# Patient Record
Sex: Female | Born: 1945 | Race: White | Hispanic: No | Smoking: Former smoker
Health system: Southern US, Community
[De-identification: ages and names within clinical notes are randomized; demographics above are authoritative.]

## PROBLEM LIST (undated history)

## (undated) DIAGNOSIS — K219 Gastro-esophageal reflux disease without esophagitis: Secondary | ICD-10-CM

## (undated) DIAGNOSIS — R87629 Unspecified abnormal cytological findings in specimens from vagina: Secondary | ICD-10-CM

## (undated) DIAGNOSIS — J45909 Unspecified asthma, uncomplicated: Secondary | ICD-10-CM

## (undated) DIAGNOSIS — Z86018 Personal history of other benign neoplasm: Secondary | ICD-10-CM

## (undated) DIAGNOSIS — J302 Other seasonal allergic rhinitis: Secondary | ICD-10-CM

## (undated) DIAGNOSIS — C801 Malignant (primary) neoplasm, unspecified: Secondary | ICD-10-CM

## (undated) DIAGNOSIS — Z9289 Personal history of other medical treatment: Secondary | ICD-10-CM

## (undated) DIAGNOSIS — K659 Peritonitis, unspecified: Secondary | ICD-10-CM

## (undated) DIAGNOSIS — Z87898 Personal history of other specified conditions: Secondary | ICD-10-CM

## (undated) DIAGNOSIS — I10 Essential (primary) hypertension: Secondary | ICD-10-CM

## (undated) DIAGNOSIS — J9690 Respiratory failure, unspecified, unspecified whether with hypoxia or hypercapnia: Secondary | ICD-10-CM

## (undated) DIAGNOSIS — K432 Incisional hernia without obstruction or gangrene: Secondary | ICD-10-CM

## (undated) DIAGNOSIS — N189 Chronic kidney disease, unspecified: Secondary | ICD-10-CM

## (undated) DIAGNOSIS — R32 Unspecified urinary incontinence: Secondary | ICD-10-CM

## (undated) DIAGNOSIS — K3532 Acute appendicitis with perforation and localized peritonitis, without abscess: Secondary | ICD-10-CM

## (undated) DIAGNOSIS — B029 Zoster without complications: Secondary | ICD-10-CM

## (undated) DIAGNOSIS — D649 Anemia, unspecified: Secondary | ICD-10-CM

## (undated) DIAGNOSIS — M109 Gout, unspecified: Secondary | ICD-10-CM

## (undated) HISTORY — PX: APPENDECTOMY: SHX54

## (undated) HISTORY — PX: LEEP: SHX91

## (undated) HISTORY — DX: Incisional hernia without obstruction or gangrene: K43.2

## (undated) HISTORY — PX: CATARACT EXTRACTION: SUR2

## (undated) HISTORY — PX: BREAST BIOPSY: SHX20

## (undated) HISTORY — DX: Zoster without complications: B02.9

## (undated) HISTORY — DX: Unspecified abnormal cytological findings in specimens from vagina: R87.629

## (undated) HISTORY — DX: Personal history of other specified conditions: Z87.898

## (undated) HISTORY — PX: ABDOMINAL HYSTERECTOMY: SHX81

## (undated) HISTORY — DX: Gout, unspecified: M10.9

## (undated) HISTORY — PX: REFRACTIVE SURGERY: SHX103

## (undated) HISTORY — PX: COLONOSCOPY: SHX174

## (undated) HISTORY — DX: Essential (primary) hypertension: I10

---

## 1998-04-10 HISTORY — PX: LEEP: SHX91

## 1998-07-23 ENCOUNTER — Other Ambulatory Visit: Admission: RE | Admit: 1998-07-23 | Discharge: 1998-07-23 | Payer: Self-pay | Admitting: Obstetrics and Gynecology

## 1998-08-03 ENCOUNTER — Encounter: Payer: Self-pay | Admitting: Obstetrics and Gynecology

## 1998-08-03 ENCOUNTER — Inpatient Hospital Stay (HOSPITAL_COMMUNITY): Admission: RE | Admit: 1998-08-03 | Discharge: 1998-08-16 | Payer: Self-pay | Admitting: Obstetrics and Gynecology

## 1998-08-04 ENCOUNTER — Encounter: Payer: Self-pay | Admitting: Pulmonary Disease

## 1998-08-05 ENCOUNTER — Encounter: Payer: Self-pay | Admitting: Obstetrics and Gynecology

## 1998-08-13 ENCOUNTER — Encounter: Payer: Self-pay | Admitting: Obstetrics and Gynecology

## 1998-08-13 ENCOUNTER — Encounter: Payer: Self-pay | Admitting: Urology

## 1998-09-20 ENCOUNTER — Other Ambulatory Visit: Admission: RE | Admit: 1998-09-20 | Discharge: 1998-09-20 | Payer: Self-pay | Admitting: Obstetrics and Gynecology

## 1998-11-09 ENCOUNTER — Encounter: Payer: Self-pay | Admitting: Surgery

## 1998-11-09 ENCOUNTER — Ambulatory Visit (HOSPITAL_COMMUNITY): Admission: RE | Admit: 1998-11-09 | Discharge: 1998-11-09 | Payer: Self-pay | Admitting: Surgery

## 1998-12-03 ENCOUNTER — Encounter (INDEPENDENT_AMBULATORY_CARE_PROVIDER_SITE_OTHER): Payer: Self-pay | Admitting: Specialist

## 1998-12-03 ENCOUNTER — Inpatient Hospital Stay (HOSPITAL_COMMUNITY): Admission: RE | Admit: 1998-12-03 | Discharge: 1998-12-09 | Payer: Self-pay | Admitting: Surgery

## 1998-12-03 ENCOUNTER — Encounter: Payer: Self-pay | Admitting: Surgery

## 1999-01-07 ENCOUNTER — Other Ambulatory Visit: Admission: RE | Admit: 1999-01-07 | Discharge: 1999-01-07 | Payer: Self-pay | Admitting: Obstetrics and Gynecology

## 1999-02-22 ENCOUNTER — Encounter (INDEPENDENT_AMBULATORY_CARE_PROVIDER_SITE_OTHER): Payer: Self-pay | Admitting: Specialist

## 1999-02-22 ENCOUNTER — Other Ambulatory Visit: Admission: RE | Admit: 1999-02-22 | Discharge: 1999-02-22 | Payer: Self-pay | Admitting: Obstetrics and Gynecology

## 1999-03-29 ENCOUNTER — Ambulatory Visit: Admission: RE | Admit: 1999-03-29 | Discharge: 1999-03-29 | Payer: Self-pay | Admitting: Gynecology

## 1999-04-11 HISTORY — PX: SUPRACERVICAL ABDOMINAL HYSTERECTOMY: SHX5393

## 1999-05-17 ENCOUNTER — Ambulatory Visit (HOSPITAL_COMMUNITY): Admission: RE | Admit: 1999-05-17 | Discharge: 1999-05-17 | Payer: Self-pay | Admitting: Gynecology

## 1999-06-21 ENCOUNTER — Ambulatory Visit: Admission: RE | Admit: 1999-06-21 | Discharge: 1999-06-21 | Payer: Self-pay | Admitting: Gynecology

## 1999-08-01 ENCOUNTER — Encounter: Payer: Self-pay | Admitting: Gastroenterology

## 1999-08-01 ENCOUNTER — Encounter: Admission: RE | Admit: 1999-08-01 | Discharge: 1999-08-01 | Payer: Self-pay | Admitting: Gastroenterology

## 1999-10-04 ENCOUNTER — Other Ambulatory Visit: Admission: RE | Admit: 1999-10-04 | Discharge: 1999-10-04 | Payer: Self-pay | Admitting: Gynecology

## 1999-10-04 ENCOUNTER — Ambulatory Visit: Admission: RE | Admit: 1999-10-04 | Discharge: 1999-10-04 | Payer: Self-pay | Admitting: Gynecology

## 2000-03-19 ENCOUNTER — Other Ambulatory Visit: Admission: RE | Admit: 2000-03-19 | Discharge: 2000-03-19 | Payer: Self-pay | Admitting: Obstetrics and Gynecology

## 2000-08-07 ENCOUNTER — Encounter: Payer: Self-pay | Admitting: Gastroenterology

## 2000-08-07 ENCOUNTER — Encounter: Admission: RE | Admit: 2000-08-07 | Discharge: 2000-08-07 | Payer: Self-pay

## 2000-09-10 ENCOUNTER — Other Ambulatory Visit: Admission: RE | Admit: 2000-09-10 | Discharge: 2000-09-10 | Payer: Self-pay | Admitting: Obstetrics and Gynecology

## 2001-05-17 ENCOUNTER — Other Ambulatory Visit: Admission: RE | Admit: 2001-05-17 | Discharge: 2001-05-17 | Payer: Self-pay | Admitting: Obstetrics and Gynecology

## 2001-08-12 ENCOUNTER — Encounter: Admission: RE | Admit: 2001-08-12 | Discharge: 2001-08-12 | Payer: Self-pay | Admitting: Gastroenterology

## 2001-08-12 ENCOUNTER — Encounter: Payer: Self-pay | Admitting: Gastroenterology

## 2001-11-14 ENCOUNTER — Other Ambulatory Visit: Admission: RE | Admit: 2001-11-14 | Discharge: 2001-11-14 | Payer: Self-pay | Admitting: Obstetrics and Gynecology

## 2002-05-12 ENCOUNTER — Other Ambulatory Visit: Admission: RE | Admit: 2002-05-12 | Discharge: 2002-05-12 | Payer: Self-pay | Admitting: Obstetrics and Gynecology

## 2002-08-19 ENCOUNTER — Encounter: Payer: Self-pay | Admitting: Gastroenterology

## 2002-08-19 ENCOUNTER — Encounter: Admission: RE | Admit: 2002-08-19 | Discharge: 2002-08-19 | Payer: Self-pay | Admitting: Gastroenterology

## 2002-08-21 ENCOUNTER — Encounter: Payer: Self-pay | Admitting: Gastroenterology

## 2002-08-21 ENCOUNTER — Encounter: Admission: RE | Admit: 2002-08-21 | Discharge: 2002-08-21 | Payer: Self-pay | Admitting: Gastroenterology

## 2002-11-10 ENCOUNTER — Other Ambulatory Visit: Admission: RE | Admit: 2002-11-10 | Discharge: 2002-11-10 | Payer: Self-pay | Admitting: Obstetrics and Gynecology

## 2003-05-21 ENCOUNTER — Other Ambulatory Visit: Admission: RE | Admit: 2003-05-21 | Discharge: 2003-05-21 | Payer: Self-pay | Admitting: Obstetrics and Gynecology

## 2003-09-01 ENCOUNTER — Ambulatory Visit (HOSPITAL_COMMUNITY): Admission: RE | Admit: 2003-09-01 | Discharge: 2003-09-01 | Payer: Self-pay | Admitting: Gastroenterology

## 2003-09-10 ENCOUNTER — Encounter: Admission: RE | Admit: 2003-09-10 | Discharge: 2003-09-10 | Payer: Self-pay | Admitting: Gastroenterology

## 2003-11-09 ENCOUNTER — Other Ambulatory Visit: Admission: RE | Admit: 2003-11-09 | Discharge: 2003-11-09 | Payer: Self-pay | Admitting: Obstetrics and Gynecology

## 2004-05-24 ENCOUNTER — Other Ambulatory Visit: Admission: RE | Admit: 2004-05-24 | Discharge: 2004-05-24 | Payer: Self-pay | Admitting: Obstetrics and Gynecology

## 2004-09-12 ENCOUNTER — Encounter: Admission: RE | Admit: 2004-09-12 | Discharge: 2004-09-12 | Payer: Self-pay | Admitting: Gastroenterology

## 2005-04-25 ENCOUNTER — Other Ambulatory Visit: Admission: RE | Admit: 2005-04-25 | Discharge: 2005-04-25 | Payer: Self-pay | Admitting: Obstetrics & Gynecology

## 2005-09-20 ENCOUNTER — Encounter: Admission: RE | Admit: 2005-09-20 | Discharge: 2005-09-20 | Payer: Self-pay | Admitting: Obstetrics & Gynecology

## 2006-04-27 ENCOUNTER — Other Ambulatory Visit: Admission: RE | Admit: 2006-04-27 | Discharge: 2006-04-27 | Payer: Self-pay | Admitting: Obstetrics & Gynecology

## 2006-09-25 ENCOUNTER — Encounter: Admission: RE | Admit: 2006-09-25 | Discharge: 2006-09-25 | Payer: Self-pay | Admitting: Gastroenterology

## 2006-10-04 ENCOUNTER — Encounter: Admission: RE | Admit: 2006-10-04 | Discharge: 2006-10-04 | Payer: Self-pay | Admitting: Gastroenterology

## 2007-05-02 ENCOUNTER — Other Ambulatory Visit: Admission: RE | Admit: 2007-05-02 | Discharge: 2007-05-02 | Payer: Self-pay | Admitting: Obstetrics & Gynecology

## 2007-10-29 ENCOUNTER — Encounter: Admission: RE | Admit: 2007-10-29 | Discharge: 2007-10-29 | Payer: Self-pay | Admitting: Obstetrics & Gynecology

## 2008-05-07 ENCOUNTER — Other Ambulatory Visit: Admission: RE | Admit: 2008-05-07 | Discharge: 2008-05-07 | Payer: Self-pay | Admitting: Obstetrics & Gynecology

## 2008-11-19 ENCOUNTER — Encounter: Admission: RE | Admit: 2008-11-19 | Discharge: 2008-11-19 | Payer: Self-pay | Admitting: Obstetrics & Gynecology

## 2009-11-23 ENCOUNTER — Encounter: Admission: RE | Admit: 2009-11-23 | Discharge: 2009-11-23 | Payer: Self-pay | Admitting: Obstetrics & Gynecology

## 2010-08-26 NOTE — Consult Note (Signed)
[facility]  Patient:    Rachel Villarreal, Rachel Villarreal               MRN: [id number] Proc. Date: 10/04/99 Adm. Date:  [id number] Attending:  Alvino Chapel CC:         Jolyn Nap, M.D.             Caswell Corwin, R.N.                          Consultation Report  HISTORY OF PRESENT ILLNESS:  31 white female returns for a Pap  smear having undergone extensive laser vaporization of the cervix on February 06th for severe dysplasia.  This is her first postoperative Pap smear.  She had an uncomplicated postoperative course.  She denies any vaginal bleeding, discharge or any other pelvic symptoms.  PHYSICAL EXAMINATION:  PELVIC EXAM:  EGBUS normal.  Vagina is clean and well-supported.  Bimanual and rectovaginal exams reveal no mass, induration or nodularity.  Pap smears were obtained.  IMPRESSION:  Past history of carcinoma in situ III, status post laser vaporization.  RECOMMENDATIONS:  We will await the reports of the current Pap smear.  If these are normal she will return to the care of Dr. Margaretha Glassing.   I recommend she have a repeat Pap smear in four to six months for continued surveillance.  She will be  notified if this Pap smear is abnormal; and, if it is we will have her return for reevaluation with colposcopy. DD:  10/04/99 TD:  10/05/99 Job: [id number]

## 2010-12-07 ENCOUNTER — Other Ambulatory Visit: Payer: Self-pay | Admitting: Obstetrics & Gynecology

## 2010-12-07 DIAGNOSIS — Z1231 Encounter for screening mammogram for malignant neoplasm of breast: Secondary | ICD-10-CM

## 2010-12-22 ENCOUNTER — Ambulatory Visit
Admission: RE | Admit: 2010-12-22 | Discharge: 2010-12-22 | Disposition: A | Discharge status: Home / Self Care | Payer: BC Managed Care – PPO | Source: Ambulatory Visit | Attending: Obstetrics & Gynecology | Admitting: Obstetrics & Gynecology

## 2010-12-22 DIAGNOSIS — Z1231 Encounter for screening mammogram for malignant neoplasm of breast: Secondary | ICD-10-CM

## 2011-07-26 ENCOUNTER — Other Ambulatory Visit: Payer: Self-pay | Admitting: Obstetrics & Gynecology

## 2011-07-26 DIAGNOSIS — M899 Disorder of bone, unspecified: Secondary | ICD-10-CM

## 2011-07-26 DIAGNOSIS — M810 Age-related osteoporosis without current pathological fracture: Secondary | ICD-10-CM

## 2011-07-26 DIAGNOSIS — Z1231 Encounter for screening mammogram for malignant neoplasm of breast: Secondary | ICD-10-CM

## 2011-07-26 DIAGNOSIS — Z780 Asymptomatic menopausal state: Secondary | ICD-10-CM

## 2011-12-25 ENCOUNTER — Ambulatory Visit
Admission: RE | Admit: 2011-12-25 | Discharge: 2011-12-25 | Disposition: A | Discharge status: Home / Self Care | Payer: BC Managed Care – PPO | Source: Ambulatory Visit | Attending: Obstetrics & Gynecology | Admitting: Obstetrics & Gynecology

## 2011-12-25 DIAGNOSIS — Z1231 Encounter for screening mammogram for malignant neoplasm of breast: Secondary | ICD-10-CM

## 2011-12-25 DIAGNOSIS — Z780 Asymptomatic menopausal state: Secondary | ICD-10-CM

## 2011-12-25 DIAGNOSIS — M899 Disorder of bone, unspecified: Secondary | ICD-10-CM

## 2011-12-25 DIAGNOSIS — M810 Age-related osteoporosis without current pathological fracture: Secondary | ICD-10-CM

## 2011-12-27 ENCOUNTER — Other Ambulatory Visit: Payer: Self-pay | Admitting: Obstetrics & Gynecology

## 2011-12-27 DIAGNOSIS — R928 Other abnormal and inconclusive findings on diagnostic imaging of breast: Secondary | ICD-10-CM

## 2011-12-28 ENCOUNTER — Ambulatory Visit
Admission: RE | Admit: 2011-12-28 | Discharge: 2011-12-28 | Disposition: A | Discharge status: Home / Self Care | Payer: BC Managed Care – PPO | Source: Ambulatory Visit | Attending: Obstetrics & Gynecology | Admitting: Obstetrics & Gynecology

## 2011-12-28 DIAGNOSIS — R928 Other abnormal and inconclusive findings on diagnostic imaging of breast: Secondary | ICD-10-CM

## 2012-05-11 DIAGNOSIS — M109 Gout, unspecified: Secondary | ICD-10-CM

## 2012-05-11 HISTORY — DX: Gout, unspecified: M10.9

## 2012-05-31 ENCOUNTER — Other Ambulatory Visit: Payer: Self-pay | Admitting: Obstetrics & Gynecology

## 2012-05-31 DIAGNOSIS — N6310 Unspecified lump in the right breast, unspecified quadrant: Secondary | ICD-10-CM

## 2012-06-24 ENCOUNTER — Ambulatory Visit
Admission: RE | Admit: 2012-06-24 | Discharge: 2012-06-24 | Disposition: A | Discharge status: Home / Self Care | Payer: BC Managed Care – PPO | Source: Ambulatory Visit | Attending: Obstetrics & Gynecology | Admitting: Obstetrics & Gynecology

## 2012-06-24 ENCOUNTER — Other Ambulatory Visit: Payer: Self-pay | Admitting: Obstetrics & Gynecology

## 2012-06-24 DIAGNOSIS — N6310 Unspecified lump in the right breast, unspecified quadrant: Secondary | ICD-10-CM

## 2012-06-27 ENCOUNTER — Other Ambulatory Visit: Payer: Self-pay | Admitting: Obstetrics & Gynecology

## 2012-06-27 ENCOUNTER — Ambulatory Visit
Admission: RE | Admit: 2012-06-27 | Discharge: 2012-06-27 | Disposition: A | Discharge status: Home / Self Care | Payer: BC Managed Care – PPO | Source: Ambulatory Visit | Attending: Obstetrics & Gynecology | Admitting: Obstetrics & Gynecology

## 2012-06-27 DIAGNOSIS — N6310 Unspecified lump in the right breast, unspecified quadrant: Secondary | ICD-10-CM

## 2012-07-04 NOTE — Progress Notes (Signed)
In [place] recall 9/14

## 2012-08-08 ENCOUNTER — Ambulatory Visit (INDEPENDENT_AMBULATORY_CARE_PROVIDER_SITE_OTHER): Payer: BC Managed Care – PPO | Admitting: Obstetrics & Gynecology

## 2012-08-08 ENCOUNTER — Encounter: Payer: Self-pay | Admitting: Obstetrics & Gynecology

## 2012-08-08 VITALS — BP 122/76 | HR 72 | Resp 16 | Ht 62.5 in | Wt 171.0 lb

## 2012-08-08 DIAGNOSIS — Z124 Encounter for screening for malignant neoplasm of cervix: Secondary | ICD-10-CM

## 2012-08-08 DIAGNOSIS — Z01419 Encounter for gynecological examination (general) (routine) without abnormal findings: Secondary | ICD-10-CM

## 2012-08-08 MED ORDER — ESTRADIOL 0.05 MG/24HR TD PTTW: 1.0000 | MEDICATED_PATCH | TRANSDERMAL | Status: DC

## 2012-08-08 MED ORDER — NYSTATIN 100000 UNIT/GM EX POWD: Freq: Three times a day (TID) | CUTANEOUS | Status: DC

## 2012-08-08 NOTE — Progress Notes (Addendum)
67 y.o. G0P0000 DivorcedCaucasianF here for annual exam.  Going to switch to [place].  No VB.  Blood work was in 2/14.  Going to retire next year.    No LMP recorded. Patient is postmenopausal.          Sexually active: no  The current method of family planning is none.    Exercising: yes  Home exercise routine includes stretching. Smoker:  no  Health Maintenance: Pap:  07/19/11 MMG:  06/24/12, bx was + for fibrocystic changes Colonoscopy: 2013, Dr. Watt Climes --follow-up 3 years (obtained records 08/12/12--tubular adenoma x 2.  F/u 3 yrs) BMD:   12/25/11, normal TDaP: 2012 with PCP Labs: PCP   reports that she has never smoked. She has never used smokeless tobacco. She reports that she does not drink alcohol or use illicit drugs.  Past Medical History  Diagnosis Date  . Hypertension   . Fibroid   . Hernia, incisional     Past Surgical History  Procedure Laterality Date  . Abdominal hysterectomy    . Cataract extraction      Current Outpatient Prescriptions  Medication Sig Dispense Refill  . ALBUTEROL IN Inhale into the lungs.      Marland Kitchen amLODipine (NORVASC) 5 MG tablet Take 5 mg by mouth daily.      . Calcium Carbonate-Vitamin D (CALCIUM 500 + D PO) Take by mouth.      . Cholecalciferol (VITAMIN D HIGH POTENCY) 1000 UNITS capsule Take 1,000 Units by mouth daily.      Marland Kitchen estradiol (VIVELLE-DOT) 0.05 MG/24HR Place 1 patch onto the skin once a week.      . febuxostat (ULORIC) 40 MG tablet Take 40 mg by mouth daily.      Marland Kitchen glucosamine-chondroitin 500-400 MG tablet Take 1 tablet by mouth 3 (three) times daily.      Marland Kitchen loratadine (CLARITIN) 10 MG tablet Take 10 mg by mouth daily.      Marland Kitchen losartan-hydrochlorothiazide (HYZAAR) 100-12.5 MG per tablet Take 1 tablet by mouth daily.      . mometasone (NASONEX) 50 MCG/ACT nasal spray Place 2 sprays into the nose daily.      . pantoprazole (PROTONIX) 40 MG tablet Take 40 mg by mouth daily.      . simvastatin (ZOCOR) 20 MG tablet Take 20 mg by  mouth every evening.       No current facility-administered medications for this visit.    No family history on file.  ROS:  Pertinent items are noted in HPI.  Otherwise, a comprehensive ROS was negative.  Exam:   BP 122/76  Pulse 72  Resp 16  Ht 5' 2.5" (1.588 m)  Wt 171 lb (77.565 kg)  BMI 30.76 kg/m2  Weight change: -9 lb   Height: 5' 2.5" (158.8 cm)  Ht Readings from Last 3 Encounters:  08/08/12 5' 2.5" (1.588 m)    General appearance: alert, cooperative and appears stated age Head: Normocephalic, without obvious abnormality, atraumatic Neck: no adenopathy, supple, symmetrical, trachea midline and thyroid normal to inspection and palpation Lungs: clear to auscultation bilaterally Breasts: normal appearance, no masses or tenderness Heart: regular rate and rhythm Abdomen: soft, non-tender; bowel sounds normal; no masses,  no organomegaly Extremities: extremities normal, atraumatic, no cyanosis or edema Skin: Skin color, texture, turgor normal. No rashes or lesions Lymph nodes: Cervical, supraclavicular, and axillary nodes normal. No abnormal inguinal nodes palpated Neurologic: Grossly normal   Pelvic: External genitalia:  no lesions  Urethra:  normal appearing urethra with no masses, tenderness or lesions              Bartholins and Skenes: normal                 Vagina: normal appearing vagina with normal color and discharge, no lesions              Cervix: absent              Pap taken: yes Bimanual Exam:  Uterus:  uterus absent              Adnexa: no mass, fullness, tenderness               Rectovaginal: Confirms               Anus:  normal sphincter tone, no lesions  A:  Well Woman with normal exam H/O TAH, ovaries remain, 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 pap smear done today Rx for Minivelle 0.05mg  patches. Nystatin powder prn.  Rx given. Release of records for  colonoscopy from Dr. Watt Climes return annually or prn  An After Visit Summary was printed and given to the patient.

## 2012-08-08 NOTE — Patient Instructions (Signed)

## 2012-08-12 LAB — IPS PAP TEST WITH HPV

## 2013-01-30 ENCOUNTER — Other Ambulatory Visit: Payer: Self-pay

## 2013-01-30 DIAGNOSIS — Z1231 Encounter for screening mammogram for malignant neoplasm of breast: Secondary | ICD-10-CM

## 2013-02-08 DIAGNOSIS — B029 Zoster without complications: Secondary | ICD-10-CM

## 2013-02-08 HISTORY — DX: Zoster without complications: B02.9

## 2013-02-11 ENCOUNTER — Other Ambulatory Visit: Payer: Self-pay | Admitting: Certified Nurse Midwife

## 2013-02-27 ENCOUNTER — Ambulatory Visit
Admission: RE | Admit: 2013-02-27 | Discharge: 2013-02-27 | Disposition: A | Discharge status: Home / Self Care | Payer: Medicare Other | Source: Ambulatory Visit

## 2013-02-27 DIAGNOSIS — Z1231 Encounter for screening mammogram for malignant neoplasm of breast: Secondary | ICD-10-CM

## 2013-08-14 ENCOUNTER — Other Ambulatory Visit: Payer: Self-pay | Admitting: Obstetrics & Gynecology

## 2013-08-14 NOTE — Telephone Encounter (Signed)
[facility] [place] filled - 08/08/12, X 1 YEAR Last AEX - 08/08/12 Next AEX - 11/18/12 MMG - 02/28/13, NORMAL Refill sent until AEX

## 2013-09-22 ENCOUNTER — Telehealth: Payer: Self-pay | Admitting: Emergency Medicine

## 2013-09-22 MED ORDER — ESTRADIOL 0.05 MG/24HR TD PTTW: 1.0000 | MEDICATED_PATCH | TRANSDERMAL | Status: DC

## 2013-09-22 NOTE — Telephone Encounter (Signed)
Received incoming [facility] [place] [facility], with questions regarding patient's rx for Minivelle 0.5 mg. Order is for weekly patch change and patient states she has been doing biweekly changes.  [facility] at [facility] Minivelle 0.05 mg/24 hour patch, change patch biweekly #8 with 3 refills until next annual with Dr. [name] Heck for 11/2013.   Routing to provider for final review. Patient agreeable to disposition. Will close encounter

## 2013-11-18 ENCOUNTER — Encounter: Payer: Self-pay | Admitting: Obstetrics & Gynecology

## 2013-11-18 ENCOUNTER — Ambulatory Visit (INDEPENDENT_AMBULATORY_CARE_PROVIDER_SITE_OTHER): Payer: Medicare Other | Admitting: Obstetrics & Gynecology

## 2013-11-18 VITALS — BP 128/66 | HR 72 | Ht 62.25 in | Wt 176.0 lb

## 2013-11-18 DIAGNOSIS — Z79890 Hormone replacement therapy (postmenopausal): Secondary | ICD-10-CM

## 2013-11-18 DIAGNOSIS — Z124 Encounter for screening for malignant neoplasm of cervix: Secondary | ICD-10-CM

## 2013-11-18 DIAGNOSIS — Z01419 Encounter for gynecological examination (general) (routine) without abnormal findings: Secondary | ICD-10-CM

## 2013-11-18 MED ORDER — NYSTATIN 100000 UNIT/GM EX POWD: Freq: Three times a day (TID) | CUTANEOUS | Status: DC

## 2013-11-18 MED ORDER — ESTRADIOL 0.05 MG/24HR TD PTTW: 1.0000 | MEDICATED_PATCH | TRANSDERMAL | Status: DC

## 2013-11-18 NOTE — Progress Notes (Signed)
68 y.o. G0P0000 DivorcedCaucasianF here for annual exam.  Doing well.  No vaginal bleeding.  Planned to retire this year.  Moving towards to 20 hours a week.    No LMP recorded. Patient is postmenopausal.          Sexually active: No.  The current method of family planning is post menopausal status.    Exercising: Yes.    Home exercise routine includes stretching and using the elastic bands. Smoker:  no  Health Maintenance: Pap: 08/08/12 HR HVP Neg  History of abnormal Pap:  no MMG:  02/2013 Bi-Rads Neg, did 3D Colonoscopy:  2013, polyps WNL,  Dr. Ty Hilts up every 3 years BMD:   12/25/11, normal TDaP:  2012 with PCP Screening Labs: PCP, Hb today: PCPC, Urine today: PCP   reports that she has never smoked. She has never used smokeless tobacco. She reports that she does not drink alcohol or use illicit drugs.  Past Medical History  Diagnosis Date  . Hypertension   . Hernia, incisional   . History of abnormal Pap smear     CIN III, 2001  . Gout 2/14    Past Surgical History  Procedure Laterality Date  . Abdominal hysterectomy    . Cataract extraction    . Breast biopsy      fibrocystic changes    Current Outpatient Prescriptions  Medication Sig Dispense Refill  . ALBUTEROL IN Inhale into the lungs.      Marland Kitchen amLODipine (NORVASC) 5 MG tablet Take 5 mg by mouth daily.      Marland Kitchen atorvastatin (LIPITOR) 10 MG tablet Take 10 mg by mouth daily.      . Calcium Carbonate-Vitamin D (CALCIUM 500 + D PO) Take by mouth.      . Cholecalciferol (VITAMIN D HIGH POTENCY) 1000 UNITS capsule Take 1,000 Units by mouth daily.      Marland Kitchen estradiol (MINIVELLE) 0.05 MG/24HR patch Place 1 patch (0.05 mg total) onto the skin 2 (two) times a week.  8 patch  3  . febuxostat (ULORIC) 40 MG tablet Take 40 mg by mouth daily.      Marland Kitchen loratadine (CLARITIN) 10 MG tablet Take 10 mg by mouth daily.      Marland Kitchen losartan-hydrochlorothiazide (HYZAAR) 100-12.5 MG per tablet Take 1 tablet by mouth daily.      Marland Kitchen nystatin  (MYCOSTATIN) powder Apply topically 3 (three) times daily. Apply to affected area for up to 7 days  60 g  2  . pantoprazole (PROTONIX) 40 MG tablet Take 40 mg by mouth daily.      Marland Kitchen triamcinolone (NASACORT) 55 MCG/ACT AERO nasal inhaler Place 2 sprays into the nose daily.       No current facility-administered medications for this visit.    No family history on file.  ROS:  Pertinent items are noted in HPI.  Otherwise, a comprehensive ROS was negative.  Exam:   BP 128/66  Pulse 72  Ht 5' 2.25" (1.581 m)  Wt 176 lb (79.833 kg)  BMI 31.94 kg/m2  Height: 5' 2.25" (158.1 cm)  Ht Readings from Last 3 Encounters:  11/18/13 5' 2.25" (1.581 m)  08/08/12 5' 2.5" (1.588 m)    General appearance: alert, cooperative and appears stated age Head: Normocephalic, without obvious abnormality, atraumatic Neck: no adenopathy, supple, symmetrical, trachea midline and thyroid normal to inspection and palpation Lungs: clear to auscultation bilaterally Breasts: normal appearance, no masses or tenderness Heart: regular rate and rhythm Abdomen: soft, non-tender; bowel sounds normal;  no masses,  no organomegaly Extremities: extremities normal, atraumatic, no cyanosis or edema Skin: Skin color, texture, turgor normal. No rashes or lesions Lymph nodes: Cervical, supraclavicular, and axillary nodes normal. No abnormal inguinal nodes palpated Neurologic: Grossly normal   Pelvic: External genitalia:  no lesions              Urethra:  normal appearing urethra with no masses, tenderness or lesions              Bartholins and Skenes: normal                 Vagina: normal appearing vagina with normal color and discharge, no lesions              Cervix: absent              Pap taken: No. 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  Sees Dr. Watt Climes Skin candida   P: Mammogram yearly.  Doing 3D due to breast density. pap smear with neg HR HPV 5/14.  No pap today Rx for Minivelle 0.05mg  patches.  Pt will cut in 1/2 to begin weaning off this year.  Pt aware of risks of DVT, PE, stroke, MI, breast cancer but desires to continue. Nystatin powder prn. Rx given.  return annually or prn  An After Visit Summary was printed and given to the patient.

## 2014-01-23 ENCOUNTER — Other Ambulatory Visit: Payer: Self-pay

## 2014-01-23 NOTE — Telephone Encounter (Signed)
[facility]: Minivelle 0.05 MG Patch  Last AEX:11/18/13 Last Refill:11/18/13 #8 X 13 Next AEX:12/04/14 Last MMG:02/28/13 Bi-Rads Neg  Pt had a new refill sent in on 11/18/13 Encounter closed

## 2014-01-26 ENCOUNTER — Other Ambulatory Visit: Payer: Self-pay

## 2014-01-26 DIAGNOSIS — Z1231 Encounter for screening mammogram for malignant neoplasm of breast: Secondary | ICD-10-CM

## 2014-01-28 ENCOUNTER — Other Ambulatory Visit: Payer: Self-pay

## 2014-01-28 MED ORDER — ESTRADIOL 0.05 MG/24HR TD PTTW: 1.0000 | MEDICATED_PATCH | TRANSDERMAL | Status: DC

## 2014-01-28 NOTE — Telephone Encounter (Signed)
[facility]: Minivelle 0.05 mg  Last AEX: 11/18/13 Last Refill:11/18/13 #8 X 13 Next AEX:12/04/14 Last MMG:02/28/13 Bi-Rads 1: Neg, Next is scheduled on 03/02/14  Pharmacy states they never received the new rx on 11/18/13. Re-sent to pharmacy. Encounter closed

## 2014-03-02 ENCOUNTER — Ambulatory Visit
Admission: RE | Admit: 2014-03-02 | Discharge: 2014-03-02 | Disposition: A | Discharge status: Home / Self Care | Payer: Medicare Other | Source: Ambulatory Visit

## 2014-03-02 DIAGNOSIS — Z1231 Encounter for screening mammogram for malignant neoplasm of breast: Secondary | ICD-10-CM

## 2014-06-23 ENCOUNTER — Telehealth: Payer: Self-pay

## 2014-06-23 NOTE — Telephone Encounter (Signed)
Spoke with patient. Advised patient prior authorization for Minivelle has been denied. Forms sent to scan. Dr.Miller recommends that patient start on Estradiol 0.5mg  tablet daily. "When I went to the pharmacy I meant to call. They told me they would cover one more month. 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 I know this would be less expensive." Advised patient would send a message over to [place] and return call with further recommendations. Patient is agreeable.

## 2014-06-23 NOTE — Telephone Encounter (Signed)
Yes, I am totally fine with this as well.  Patients are doing well with it so I think it is a good product.  OK to order as generic is Mrs. Melchor desires.  Thanks.

## 2014-06-24 MED ORDER — ESTRADIOL 0.05 MG/24HR TD PTTW: 1.0000 | MEDICATED_PATCH | TRANSDERMAL | Status: DC

## 2014-06-24 NOTE — Telephone Encounter (Signed)
Generic Vivelle dot 0.05mg  #8 11RF sent to pharmacy on file. Spoke with patient. Advised of message as seen below from [place] and that new rx has been sent in. Patient is agreeable and verbalizes understanding.  Routing to provider for final review. Patient agreeable to disposition. Will close encounter

## 2014-09-14 ENCOUNTER — Other Ambulatory Visit: Payer: Self-pay | Admitting: Gastroenterology

## 2014-12-04 ENCOUNTER — Ambulatory Visit (INDEPENDENT_AMBULATORY_CARE_PROVIDER_SITE_OTHER): Payer: Medicare Other | Admitting: Obstetrics & Gynecology

## 2014-12-04 ENCOUNTER — Encounter: Payer: Self-pay | Admitting: Obstetrics & Gynecology

## 2014-12-04 VITALS — BP 118/60 | HR 80 | Resp 16 | Ht 62.25 in | Wt 177.0 lb

## 2014-12-04 DIAGNOSIS — Z124 Encounter for screening for malignant neoplasm of cervix: Secondary | ICD-10-CM

## 2014-12-04 DIAGNOSIS — Z01419 Encounter for gynecological examination (general) (routine) without abnormal findings: Secondary | ICD-10-CM

## 2014-12-04 MED ORDER — ESTRADIOL 0.025 MG/24HR TD PTTW
1.0000 | MEDICATED_PATCH | TRANSDERMAL | Status: DC
Start: 2014-12-04 — End: 2015-12-22

## 2014-12-04 NOTE — Progress Notes (Signed)
69 y.o. G0P0000 DivorcedCaucasianF here for annual exam.  Doing to [place] next week.  Going on a [place], [place], [place], and a couple of other cities.  Currently has a URI that she picked up from a colleague at work.  Down to 20 hours a week.  She states she has "tried" to retire.   No vaginal bleeding.     Pt states she didn't try to wean off HRT by decreasing dosage, she is just now letting patches on for 5, 6 or 7 days.  Advised I think she should really try to work towards being off her HRT.  No LMP recorded. Patient is postmenopausal.          Sexually active: No.  The current method of family planning is post menopausal status.    Exercising: Yes.    Stretches Smoker:  no  Health Maintenance: Pap:  08/08/12 neg. HR HPV:neg History of abnormal Pap:  no MMG:  03/02/14 BIRADS1:Neg Colonoscopy:  09/2014- polyps, Diverticulosis - Repeat 3 years  BMD:   12/2011, 0.4/-0.9 TDaP:  2012 PCP Screening Labs: PCP, Hb today: PCP, Urine today: PCP   reports that she has never smoked. She has never used smokeless tobacco. She reports that she does not drink alcohol or use illicit drugs.  Past Medical History  Diagnosis Date  . Hypertension   . Hernia, incisional   . History of abnormal Pap smear     CIN III, 2001  . Gout 2/14  . Shingles 11/14    very mild case    Past Surgical History  Procedure Laterality Date  . Abdominal hysterectomy    . Cataract extraction    . Breast biopsy      fibrocystic changes    Current Outpatient Prescriptions  Medication Sig Dispense Refill  . amLODipine (NORVASC) 5 MG tablet Take 5 mg by mouth daily.    Marland Kitchen atorvastatin (LIPITOR) 10 MG tablet Take 10 mg by mouth daily.    . Calcium Carbonate-Vitamin D (CALCIUM 500 + D PO) Take by mouth.    . Cholecalciferol (VITAMIN D HIGH POTENCY) 1000 UNITS capsule Take 1,000 Units by mouth daily.    Marland Kitchen estradiol (VIVELLE-DOT) 0.05 MG/24HR patch Place 1 patch (0.05 mg total) onto the skin 2 (two)  times a week. 8 patch 11  . febuxostat (ULORIC) 40 MG tablet Take 40 mg by mouth daily.    Marland Kitchen loratadine (CLARITIN) 10 MG tablet Take 10 mg by mouth daily.    Marland Kitchen losartan-hydrochlorothiazide (HYZAAR) 100-12.5 MG per tablet Take 1 tablet by mouth daily.    . pantoprazole (PROTONIX) 40 MG tablet Take 40 mg by mouth daily.    Marland Kitchen PROAIR HFA 108 (90 BASE) MCG/ACT inhaler as directed.    . triamcinolone (NASACORT) 55 MCG/ACT AERO nasal inhaler Place 2 sprays into the nose daily.    Marland Kitchen nystatin (MYCOSTATIN) powder Apply topically 3 (three) times daily. Apply to affected area for up to 7 days (Patient not taking: Reported on 12/04/2014) 60 g 3   No current facility-administered medications for this visit.    History reviewed. No pertinent family history.  ROS:  Pertinent items are noted in HPI.  Otherwise, a comprehensive ROS was negative.  Exam:   BP 118/60 mmHg  Pulse 80  Resp 16  Ht 5' 2.25" (1.581 m)  Wt 177 lb (80.287 kg)  BMI 32.12 kg/m2  Height: 5' 2.25" (158.1 cm)  Ht Readings from Last 3 Encounters:  12/04/14 5' 2.25" (1.581  m)  11/18/13 5' 2.25" (1.581 m)  08/08/12 5' 2.5" (1.588 m)    General appearance: alert, cooperative and appears stated age Head: Normocephalic, without obvious abnormality, atraumatic Neck: no adenopathy, supple, symmetrical, trachea midline and thyroid normal to inspection and palpation Lungs: clear to auscultation bilaterally Breasts: normal appearance, no masses or tenderness Heart: regular rate and rhythm Abdomen: soft, non-tender; bowel sounds normal; no masses,  no organomegaly, umbilical hernia Extremities: extremities normal, atraumatic, no cyanosis or edema Skin: Skin color, texture, turgor normal. No rashes or lesions Lymph nodes: Cervical, supraclavicular, and axillary nodes normal. No abnormal inguinal nodes palpated Neurologic: Grossly normal   Pelvic: External genitalia:  no lesions              Urethra:  normal appearing urethra with no  masses, tenderness or lesions              Bartholins and Skenes: normal                 Vagina: normal appearing vagina with normal color and discharge, no lesions              Cervix: absent              Pap taken: Yes.   Bimanual Exam:  Uterus:  not examined              Adnexa: no mass, fullness, tenderness               Rectovaginal: Confirms               Anus:  normal sphincter tone, no lesions  Chaperone was present for exam.  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 Sees Dr. Watt Climes.  Colonoscopy done this year. H/O skin candida  H/O recurrent UTIs  P: Mammogram yearly. Doing 3D due to breast density. pap smear with neg HR HPV 5/14. Pap today. Decrease generic vivelle 0.025mg  patches twice weekly.  Pt will stretch this out and try to wean off.  Pt aware of risks of DVT, PE, stroke, MI, breast cancer.  D/W pt information about increased risk of ovarian cancer as well.  Pt willing to try and wean off.   No rx for nystatin powder today.  She will call if needs this. Rx for Macrobid 100mg  bid x 7 days for upcoming trip given.  #14/0RF. return annually or prn

## 2014-12-07 LAB — IPS PAP TEST WITH REFLEX TO HPV

## 2015-01-26 ENCOUNTER — Other Ambulatory Visit: Payer: Self-pay

## 2015-01-26 DIAGNOSIS — Z1231 Encounter for screening mammogram for malignant neoplasm of breast: Secondary | ICD-10-CM

## 2015-03-15 ENCOUNTER — Ambulatory Visit
Admission: RE | Admit: 2015-03-15 | Discharge: 2015-03-15 | Disposition: A | Discharge status: Home / Self Care | Payer: Medicare Other | Source: Ambulatory Visit

## 2015-03-15 DIAGNOSIS — Z1231 Encounter for screening mammogram for malignant neoplasm of breast: Secondary | ICD-10-CM

## 2015-12-22 ENCOUNTER — Other Ambulatory Visit: Payer: Self-pay | Admitting: Obstetrics & Gynecology

## 2015-12-22 NOTE — Telephone Encounter (Signed)
Medication refill request: Vivelle patch  Last AEX:  12-04-14  Next AEX: 03-21-16 Last MMG (if hormonal medication request): 03-16-15 WNL Refill authorized: please advise

## 2016-02-11 ENCOUNTER — Other Ambulatory Visit: Payer: Self-pay | Admitting: Internal Medicine

## 2016-02-11 DIAGNOSIS — Z1231 Encounter for screening mammogram for malignant neoplasm of breast: Secondary | ICD-10-CM

## 2016-03-20 ENCOUNTER — Ambulatory Visit
Admission: RE | Admit: 2016-03-20 | Discharge: 2016-03-20 | Disposition: A | Discharge status: Home / Self Care | Payer: Medicare Other | Source: Ambulatory Visit | Attending: Internal Medicine | Admitting: Internal Medicine

## 2016-03-20 DIAGNOSIS — Z1231 Encounter for screening mammogram for malignant neoplasm of breast: Secondary | ICD-10-CM

## 2016-03-21 ENCOUNTER — Ambulatory Visit: Payer: Medicare Other | Admitting: Obstetrics & Gynecology

## 2016-03-28 ENCOUNTER — Encounter: Payer: Self-pay | Admitting: Obstetrics & Gynecology

## 2016-03-28 ENCOUNTER — Ambulatory Visit (INDEPENDENT_AMBULATORY_CARE_PROVIDER_SITE_OTHER): Payer: Medicare Other | Admitting: Obstetrics & Gynecology

## 2016-03-28 VITALS — BP 142/84 | HR 72 | Resp 14 | Ht 62.5 in | Wt 186.0 lb

## 2016-03-28 DIAGNOSIS — I10 Essential (primary) hypertension: Secondary | ICD-10-CM | POA: Diagnosis not present

## 2016-03-28 DIAGNOSIS — E785 Hyperlipidemia, unspecified: Secondary | ICD-10-CM | POA: Insufficient documentation

## 2016-03-28 DIAGNOSIS — Z01419 Encounter for gynecological examination (general) (routine) without abnormal findings: Secondary | ICD-10-CM | POA: Diagnosis not present

## 2016-03-28 DIAGNOSIS — E784 Other hyperlipidemia: Secondary | ICD-10-CM

## 2016-03-28 DIAGNOSIS — M109 Gout, unspecified: Secondary | ICD-10-CM | POA: Insufficient documentation

## 2016-03-28 DIAGNOSIS — E7849 Other hyperlipidemia: Secondary | ICD-10-CM

## 2016-03-28 MED ORDER — ESTRADIOL 0.025 MG/24HR TD PTTW: MEDICATED_PATCH | TRANSDERMAL | 13 refills | Status: DC

## 2016-03-28 NOTE — Progress Notes (Signed)
70 y.o. G0P0000 Divorced Caucasian F here for annual exam.  Doing well.  Still working a few hours each week.  She is on prn now.  [facility]  Just got back from [place].  Going to [place] for a crystal cruise.  PCP:  Dr. Lysle Rubens.  Just saw him this month.      No LMP recorded. Patient is postmenopausal.          Sexually active: No.  The current method of family planning is post menopausal status.    Exercising: Yes.    walking Smoker:  Former smoker  Health Maintenance: Pap:  12/04/14 negative, 08/08/12 HR HPV negative  History of abnormal Pap:  no MMG:  03/21/16 BIRADS 1 negative  Colonoscopy:  06/16 polyps- repeat 3 years   BMD:   03/16/15 stable osteopenia  TDaP:  2012  Pneumonia vaccine(s): both with PCP Zostavax:   Done with PCP Hep C testing: done 12/17 with PCP Screening Labs: PCP, Hb today: PCP, Urine today: PCP   reports that she quit smoking about 43 years ago. She has never used smokeless tobacco. She reports that she does not drink alcohol or use drugs.  Past Medical History:  Diagnosis Date  . Gout 2/14  . Hernia, incisional   . History of abnormal Pap smear    CIN III, 2001  . Hypertension   . Shingles 11/14   very mild case    Past Surgical History:  Procedure Laterality Date  . ABDOMINAL HYSTERECTOMY    . BREAST BIOPSY     fibrocystic changes  . CATARACT EXTRACTION      Current Outpatient Prescriptions  Medication Sig Dispense Refill  . amLODipine (NORVASC) 5 MG tablet Take 5 mg by mouth daily.    Marland Kitchen atorvastatin (LIPITOR) 10 MG tablet Take 10 mg by mouth daily.    . Calcium Carbonate-Vitamin D (CALCIUM 500 + D PO) Take by mouth.    . Cholecalciferol (VITAMIN D HIGH POTENCY) 1000 UNITS capsule Take 1,000 Units by mouth daily.    Marland Kitchen estradiol (VIVELLE-DOT) 0.025 MG/24HR PLACE 1 PATCH ONTO SKIN TWICE A WEEK 8 patch 3  . febuxostat (ULORIC) 40 MG tablet Take 40 mg by mouth daily.    Marland Kitchen loratadine (CLARITIN) 10 MG  tablet Take 10 mg by mouth daily.    Marland Kitchen losartan-hydrochlorothiazide (HYZAAR) 100-12.5 MG per tablet Take 1 tablet by mouth daily.    Marland Kitchen nystatin (MYCOSTATIN) powder Apply topically 3 (three) times daily. Apply to affected area for up to 7 days 60 g 3  . pantoprazole (PROTONIX) 40 MG tablet Take 40 mg by mouth daily.    Marland Kitchen PROAIR HFA 108 (90 BASE) MCG/ACT inhaler as directed.    . triamcinolone (NASACORT) 55 MCG/ACT AERO nasal inhaler Place 2 sprays into the nose daily.     No current facility-administered medications for this visit.     History reviewed. No pertinent family history.  ROS:  Pertinent items are noted in HPI.  Otherwise, a comprehensive ROS was negative.  Exam:   BP (!) 142/84 (BP Location: Right Arm, Patient Position: Sitting, Cuff Size: Normal)   Pulse 72   Resp 14   Ht 5' 2.5" (1.588 m)   Wt 186 lb (84.4 kg)   BMI 33.48 kg/m   Weight change: +9#   Height: 5' 2.5" (158.8 cm)  Ht Readings from Last 3 Encounters:  03/28/16 5' 2.5" (1.588 m)  12/04/14 5' 2.25" (1.581 m)  11/18/13  5' 2.25" (1.581 m)   General appearance: alert, cooperative and appears stated age Head: Normocephalic, without obvious abnormality, atraumatic Neck: no adenopathy, supple, symmetrical, trachea midline and thyroid normal to inspection and palpation Lungs: clear to auscultation bilaterally Breasts: normal appearance, no masses or tenderness Heart: regular rate and rhythm Abdomen: soft, non-tender; bowel sounds normal; no masses,  no organomegaly Extremities: extremities normal, atraumatic, no cyanosis or edema Skin: Skin color, texture, turgor normal. No rashes or lesions Lymph nodes: Cervical, supraclavicular, and axillary nodes normal. No abnormal inguinal nodes palpated Neurologic: Grossly normal   Pelvic: External genitalia:  no lesions              Urethra:  normal appearing urethra with no masses, tenderness or lesions              Bartholins and Skenes: normal                  Vagina: normal appearing vagina with normal color and discharge, no lesions              Cervix: absent              Pap taken: No. Bimanual Exam:  Uterus:  uterus absent              Adnexa: no mass, fullness, tenderness               Rectovaginal: Confirms               Anus:  normal sphincter tone, no lesions  Chaperone was present for exam.  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  Followed by Dr. Watt Climes.   Hypertension Elevated lipids  P:   Mammogram yearly. Doing 3D due to breast density. Pap 2016 negative.  No pap today.   Rx for vivelle 0.025mg  patches twice weekly.  #8/13.  Pt has tried to wean off without success.  Pt aware of risks of DVT, PE, stroke, MI, breast cancer.  D/W pt information about increased risk of ovarian cancer as well.   No rx for nystatin powder today.  She will call when she needs it. AEX 1 year or follow up prn

## 2016-04-14 ENCOUNTER — Other Ambulatory Visit: Payer: Self-pay | Admitting: Obstetrics & Gynecology

## 2017-02-13 ENCOUNTER — Other Ambulatory Visit: Payer: Self-pay

## 2017-02-13 DIAGNOSIS — Z1231 Encounter for screening mammogram for malignant neoplasm of breast: Secondary | ICD-10-CM

## 2017-03-19 ENCOUNTER — Ambulatory Visit: Payer: Medicare Other

## 2017-03-19 ENCOUNTER — Other Ambulatory Visit: Payer: Self-pay | Admitting: Obstetrics & Gynecology

## 2017-03-20 NOTE — Telephone Encounter (Signed)
Medication refill request: Vivelle dot patch  Last AEX:  03/28/16 SM  Next AEX: 07/05/17  Last MMG (if hormonal medication request): 03/20/16 BIRADS 1 negative, scheduled for 04/12/17  Refill authorized:  03/28/16 #8 patches, 13 RF. Today, please advise.

## 2017-04-12 ENCOUNTER — Ambulatory Visit
Admission: RE | Admit: 2017-04-12 | Discharge: 2017-04-12 | Disposition: A | Discharge status: Home / Self Care | Payer: Medicare Other | Source: Ambulatory Visit | Attending: Internal Medicine | Admitting: Internal Medicine

## 2017-04-12 DIAGNOSIS — Z1231 Encounter for screening mammogram for malignant neoplasm of breast: Secondary | ICD-10-CM

## 2017-04-13 ENCOUNTER — Other Ambulatory Visit: Payer: Self-pay | Admitting: Internal Medicine

## 2017-04-13 DIAGNOSIS — R928 Other abnormal and inconclusive findings on diagnostic imaging of breast: Secondary | ICD-10-CM

## 2017-04-17 ENCOUNTER — Ambulatory Visit
Admission: RE | Admit: 2017-04-17 | Discharge: 2017-04-17 | Disposition: A | Discharge status: Home / Self Care | Payer: Medicare Other | Source: Ambulatory Visit | Attending: Internal Medicine | Admitting: Internal Medicine

## 2017-04-17 DIAGNOSIS — R928 Other abnormal and inconclusive findings on diagnostic imaging of breast: Secondary | ICD-10-CM

## 2017-07-05 ENCOUNTER — Ambulatory Visit: Payer: Medicare Other | Admitting: Obstetrics & Gynecology

## 2017-07-05 ENCOUNTER — Encounter: Payer: Self-pay | Admitting: Obstetrics & Gynecology

## 2017-07-05 ENCOUNTER — Other Ambulatory Visit (HOSPITAL_COMMUNITY)
Admission: RE | Admit: 2017-07-05 | Discharge: 2017-07-05 | Disposition: A | Discharge status: Home / Self Care | Payer: Medicare Other | Source: Ambulatory Visit | Attending: Obstetrics & Gynecology | Admitting: Obstetrics & Gynecology

## 2017-07-05 ENCOUNTER — Other Ambulatory Visit: Payer: Self-pay

## 2017-07-05 VITALS — BP 152/84 | HR 66 | Resp 14 | Ht 62.0 in | Wt 180.0 lb

## 2017-07-05 DIAGNOSIS — Z01419 Encounter for gynecological examination (general) (routine) without abnormal findings: Secondary | ICD-10-CM | POA: Diagnosis not present

## 2017-07-05 DIAGNOSIS — Z124 Encounter for screening for malignant neoplasm of cervix: Secondary | ICD-10-CM

## 2017-07-05 MED ORDER — ESTRADIOL 0.025 MG/24HR TD PTTW: 1.0000 | MEDICATED_PATCH | TRANSDERMAL | 13 refills | Status: DC

## 2017-07-05 NOTE — Progress Notes (Signed)
72 y.o. G0P0000 DivorcedCaucasianF here for annual exam.  Has officially retired from [employer] a lot last year.  Went to [place] last year.  Was a great trip.    Denies vaginal bleeding.  PCP:  Dr. Lysle Rubens.  No LMP recorded. Patient is postmenopausal.          Sexually active: No.  The current method of family planning is post menopausal status.    Exercising: Yes.    walking Smoker:  Former smoker   Health Maintenance: Pap:  12/04/14 Neg   08/08/12 Neg. HR HPV:neg  History of abnormal Pap:  no MMG:  04/17/17 [place] right BIRADS1:Neg. F/u 1 year  Colonoscopy:  09/14/14 polyps.  Followed by Dr. Watt Climes.  BMD:   03/16/15 stable osteopenia  TDaP:  2012   Pneumonia vaccine(s):  Done with PCP Shingrix:   [facility]  Hep C testing: done 12/17 with PCP Screening Labs: PCP, Hb today: PCP, Urine today: PCP   reports that she quit smoking about 45 years ago. She has never used smokeless tobacco. She reports that she does not drink alcohol or use drugs.  Past Medical History:  Diagnosis Date  . Gout 2/14  . Hernia, incisional   . History of abnormal Pap smear    CIN III, 2001  . Hypertension   . Shingles 11/14   very mild case    Past Surgical History:  Procedure Laterality Date  . ABDOMINAL HYSTERECTOMY    . BREAST BIOPSY     fibrocystic changes  . CATARACT EXTRACTION      Current Outpatient Medications  Medication Sig Dispense Refill  . amLODipine (NORVASC) 5 MG tablet Take 5 mg by mouth daily.    Marland Kitchen atorvastatin (LIPITOR) 10 MG tablet Take 10 mg by mouth daily.    . Calcium Carbonate-Vitamin D (CALCIUM 500 + D PO) Take by mouth.    . Cholecalciferol (VITAMIN D HIGH POTENCY) 1000 UNITS capsule Take 1,000 Units by mouth daily.    Marland Kitchen estradiol (VIVELLE-DOT) 0.025 MG/24HR PLACE 1 PATCH ONTO SKIN TWICE A WEEK 8 patch 4  . febuxostat (ULORIC) 40 MG tablet Take 40 mg by mouth daily.    . Fluticasone Furoate-Vilanterol (BREO ELLIPTA IN) Inhale into the lungs.    Marland Kitchen loratadine  (CLARITIN) 10 MG tablet Take 10 mg by mouth daily.    Marland Kitchen losartan-hydrochlorothiazide (HYZAAR) 100-12.5 MG per tablet Take 1 tablet by mouth daily.    Marland Kitchen nystatin (MYCOSTATIN) powder Apply topically 3 (three) times daily. Apply to affected area for up to 7 days 60 g 3  . pantoprazole (PROTONIX) 40 MG tablet Take 40 mg by mouth daily.    Marland Kitchen PROAIR HFA 108 (90 BASE) MCG/ACT inhaler as directed.    . triamcinolone (NASACORT) 55 MCG/ACT AERO nasal inhaler Place 2 sprays into the nose daily.     No current facility-administered medications for this visit.     History reviewed. No pertinent family history.  Review of Systems  Constitutional: Negative.   HENT: Negative.   Eyes: Negative.   Respiratory: Negative.   Cardiovascular: Negative.   Gastrointestinal: Negative.   Genitourinary: Negative.   Musculoskeletal: Negative.   Skin: Negative.   Neurological: Negative.   Endo/Heme/Allergies: Negative.   Psychiatric/Behavioral: Negative.     Exam:   BP (!) 152/84 (BP Location: Right Arm, Patient Position: Sitting, Cuff Size: Normal)   Pulse 66   Resp 14   Ht 5\' 2"  (1.575 m)   Wt 180  lb (81.6 kg)   BMI 32.92 kg/m   Height: 5\' 2"  (157.5 cm)  Ht Readings from Last 3 Encounters:  07/05/17 5\' 2"  (1.575 m)  03/28/16 5' 2.5" (1.588 m)  12/04/14 5' 2.25" (1.581 m)    General appearance: alert, cooperative and appears stated age Head: Normocephalic, without obvious abnormality, atraumatic Neck: no adenopathy, supple, symmetrical, trachea midline and thyroid normal to inspection and palpation Lungs: clear to auscultation bilaterally Breasts: normal appearance, no masses or tenderness Heart: regular rate and rhythm Abdomen: soft, non-tender; bowel sounds normal; no masses,  no organomegaly, umbilical hernia noted--stable Extremities: extremities normal, atraumatic, no cyanosis or edema Skin: Skin color, texture, turgor normal. No rashes or lesions Lymph nodes: Cervical, supraclavicular, and  axillary nodes normal. No abnormal inguinal nodes palpated Neurologic: Grossly normal   Pelvic: External genitalia:  no lesions              Urethra:  normal appearing urethra with no masses, tenderness or lesions              Bartholins and Skenes: normal                 Vagina: normal appearing vagina with normal color and discharge, no lesions              Cervix: small portion of cervix present              Pap taken: Yes.   Bimanual Exam:  Uterus:  uterus absent              Adnexa: no mass, fullness, tenderness               Rectovaginal: Confirms               Anus:  normal sphincter tone, no lesions  Chaperone was present for exam.  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  GI is Dr. Watt Climes. Hypertension Elevated lipids Urinary urgency  P:   Mammogram guidelines reviewed.  Doing yearly as on HRT pap smear obtained today Blood work and vaccines are UTD Pt does want to get the Shingrix vaccine.  [facility]. RF for estradiol patch 0.025mg  patches twice weekly.  #8/13RF.  Aware of risks including DVT, PE, stroke, MI.  Does not want to stop due to help with urinary issues. Return annually or prn

## 2017-07-06 LAB — CYTOLOGY - PAP: Diagnosis: NEGATIVE

## 2017-07-23 ENCOUNTER — Telehealth: Payer: Self-pay | Admitting: *Deleted

## 2017-07-23 NOTE — Telephone Encounter (Signed)
Left voice mail to call back 

## 2017-07-23 NOTE — Telephone Encounter (Signed)
-----   Message from [name], MD sent at 07/23/2017  8:45 AM EDT ----- Regarding: colonoscopy Please let pt know I did get her colonoscopy report from [facility] and Dr. Watt Climes  He recommended follow-up in 3-4 years.  Three would be this June.  She will likely receive a phone call or letter from them about this.  If needs any help with scheduling, please just let us know.  Thanks.  Vinnie Level

## 2017-07-25 NOTE — Telephone Encounter (Signed)
Patient returned call to [place].

## 2017-07-26 NOTE — Telephone Encounter (Signed)
Patient notified.  Verbalized understanding. 

## 2017-12-16 ENCOUNTER — Other Ambulatory Visit: Payer: Self-pay | Admitting: Obstetrics & Gynecology

## 2017-12-17 ENCOUNTER — Other Ambulatory Visit: Payer: Self-pay | Admitting: Gastroenterology

## 2017-12-17 NOTE — Telephone Encounter (Signed)
Medication refill request: Vivelle Dot   Last AEX:  07/05/17 Next AEX: 09/16/18 Last MMG (if hormonal medication request): 04/17/17 Bi-rads category 1 neg  Refill authorized: Pharmacy requesting 90 day supply

## 2017-12-17 NOTE — Telephone Encounter (Signed)
I will forward this to Dr. [name] Heck, the patient's primary GYN.  [place]

## 2018-02-13 ENCOUNTER — Other Ambulatory Visit: Payer: Self-pay

## 2018-02-13 ENCOUNTER — Encounter (HOSPITAL_COMMUNITY): Payer: Self-pay

## 2018-02-15 ENCOUNTER — Other Ambulatory Visit: Payer: Self-pay | Admitting: Gastroenterology

## 2018-02-17 NOTE — Anesthesia Preprocedure Evaluation (Addendum)
Anesthesia Evaluation  Patient identified by MRN, date of birth, ID band Patient awake    Reviewed: Allergy & Precautions, NPO status , Patient's Chart, lab work & pertinent test results  History of Anesthesia Complications Negative for: history of anesthetic complications  Airway Mallampati: II  TM Distance: >3 FB Neck ROM: Full    Dental no notable dental hx. (+) Dental Advisory Given   Pulmonary asthma , former smoker,    Pulmonary exam normal        Cardiovascular hypertension, Pt. on medications Normal cardiovascular exam     Neuro/Psych negative neurological ROS     GI/Hepatic Neg liver ROS, GERD  ,  Endo/Other  negative endocrine ROS  Renal/GU negative Renal ROS     Musculoskeletal negative musculoskeletal ROS (+)   Abdominal   Peds  Hematology negative hematology ROS (+)   Anesthesia Other Findings Day of surgery medications reviewed with the patient.  Reproductive/Obstetrics                            Anesthesia Physical Anesthesia Plan  ASA: III  Anesthesia Plan: MAC   Post-op Pain Management:    Induction:   PONV Risk Score and Plan: 2 and Ondansetron and Propofol infusion  Airway Management Planned: Natural Airway  Additional Equipment:   Intra-op Plan:   Post-operative Plan:   Informed Consent: I have reviewed the patients History and Physical, chart, labs and discussed the procedure including the risks, benefits and alternatives for the proposed anesthesia with the patient or authorized representative who has indicated his/her understanding and acceptance.   Dental advisory given  Plan Discussed with: Anesthesiologist and CRNA  Anesthesia Plan Comments:        Anesthesia Quick Evaluation

## 2018-02-18 ENCOUNTER — Ambulatory Visit (HOSPITAL_COMMUNITY): Payer: Medicare Other | Admitting: Anesthesiology

## 2018-02-18 ENCOUNTER — Ambulatory Visit (HOSPITAL_COMMUNITY)
Admission: RE | Admit: 2018-02-18 | Discharge: 2018-02-18 | Disposition: A | Discharge status: Home / Self Care | Payer: Medicare Other | Source: Ambulatory Visit | Attending: Gastroenterology | Admitting: Gastroenterology

## 2018-02-18 ENCOUNTER — Other Ambulatory Visit: Payer: Self-pay

## 2018-02-18 ENCOUNTER — Encounter (HOSPITAL_COMMUNITY)
Admission: RE | Disposition: A | Discharge status: Home / Self Care | Payer: Self-pay | Source: Ambulatory Visit | Attending: Gastroenterology

## 2018-02-18 ENCOUNTER — Encounter (HOSPITAL_COMMUNITY): Payer: Self-pay | Admitting: *Deleted

## 2018-02-18 DIAGNOSIS — Q439 Congenital malformation of intestine, unspecified: Secondary | ICD-10-CM | POA: Insufficient documentation

## 2018-02-18 DIAGNOSIS — Z87891 Personal history of nicotine dependence: Secondary | ICD-10-CM | POA: Insufficient documentation

## 2018-02-18 DIAGNOSIS — Z09 Encounter for follow-up examination after completed treatment for conditions other than malignant neoplasm: Secondary | ICD-10-CM | POA: Insufficient documentation

## 2018-02-18 DIAGNOSIS — Z980 Intestinal bypass and anastomosis status: Secondary | ICD-10-CM | POA: Diagnosis not present

## 2018-02-18 DIAGNOSIS — Z86010 Personal history of colonic polyps: Secondary | ICD-10-CM | POA: Insufficient documentation

## 2018-02-18 DIAGNOSIS — K5730 Diverticulosis of large intestine without perforation or abscess without bleeding: Secondary | ICD-10-CM | POA: Diagnosis not present

## 2018-02-18 DIAGNOSIS — I10 Essential (primary) hypertension: Secondary | ICD-10-CM | POA: Diagnosis not present

## 2018-02-18 HISTORY — DX: Respiratory failure, unspecified, unspecified whether with hypoxia or hypercapnia: J96.90

## 2018-02-18 HISTORY — PX: COLONOSCOPY WITH PROPOFOL: SHX5780

## 2018-02-18 HISTORY — DX: Personal history of other benign neoplasm: Z86.018

## 2018-02-18 HISTORY — DX: Other seasonal allergic rhinitis: J30.2

## 2018-02-18 HISTORY — DX: Unspecified urinary incontinence: R32

## 2018-02-18 HISTORY — DX: Peritonitis, unspecified: K65.9

## 2018-02-18 HISTORY — DX: Gastro-esophageal reflux disease without esophagitis: K21.9

## 2018-02-18 HISTORY — DX: Chronic kidney disease, unspecified: N18.9

## 2018-02-18 HISTORY — DX: Acute appendicitis with perforation, localized peritonitis, and gangrene, without abscess: K35.32

## 2018-02-18 HISTORY — DX: Unspecified asthma, uncomplicated: J45.909

## 2018-02-18 SURGERY — COLONOSCOPY WITH PROPOFOL
Anesthesia: Monitor Anesthesia Care

## 2018-02-18 MED ORDER — SODIUM CHLORIDE 0.9 % IV SOLN: INTRAVENOUS | Status: DC

## 2018-02-18 MED ORDER — PROPOFOL 10 MG/ML IV BOLUS
INTRAVENOUS | Status: AC
  Filled 2018-02-18: qty 40

## 2018-02-18 MED ORDER — PROPOFOL 10 MG/ML IV BOLUS
INTRAVENOUS | Status: AC
  Filled 2018-02-18: qty 20

## 2018-02-18 MED ORDER — ONDANSETRON HCL 4 MG/2ML IJ SOLN
INTRAMUSCULAR | Status: DC | PRN
  Administered 2018-02-18: 4 mg via INTRAVENOUS

## 2018-02-18 MED ORDER — LIDOCAINE 2% (20 MG/ML) 5 ML SYRINGE
INTRAMUSCULAR | Status: DC | PRN
  Administered 2018-02-18: 80 mg via INTRAVENOUS

## 2018-02-18 MED ORDER — PROPOFOL 10 MG/ML IV BOLUS
INTRAVENOUS | Status: DC | PRN
  Administered 2018-02-18 (×4): 30 mg via INTRAVENOUS
  Administered 2018-02-18: 20 mg via INTRAVENOUS
  Administered 2018-02-18 (×3): 30 mg via INTRAVENOUS
  Administered 2018-02-18: 20 mg via INTRAVENOUS
  Administered 2018-02-18 (×11): 30 mg via INTRAVENOUS

## 2018-02-18 MED ORDER — LACTATED RINGERS IV SOLN
INTRAVENOUS | Status: DC
  Administered 2018-02-18: 1000 mL via INTRAVENOUS

## 2018-02-18 SURGICAL SUPPLY — 21 items

## 2018-02-18 NOTE — Discharge Instructions (Signed)
Call if question or problem otherwise soft solids first meal today slowly advance as tolerated follow-up as needed and consider in 3 to 5 years a repeat colonoscopy or virtual colonoscopy or other screening test available at that time just to be sureYOU HAD AN ENDOSCOPIC PROCEDURE TODAY: Refer to the procedure report and other information in the discharge instructions given to you for any specific questions about what was found during the examination. If this information does not answer your questions, please call [facility] office at [phone] to clarify.   YOU SHOULD EXPECT: Some feelings of bloating in the abdomen. Passage of more gas than usual. Walking can help get rid of the air that was put into your GI tract during the procedure and reduce the bloating. If you had a lower endoscopy (such as a colonoscopy or flexible sigmoidoscopy) you may notice spotting of blood in your stool or on the toilet paper. Some abdominal soreness may be present for a day or two, also.  DIET: Your first meal following the procedure should be a light meal and then it is ok to progress to your normal diet. A half-sandwich or bowl of soup is an example of a good first meal. Heavy or fried foods are harder to digest and may make you feel nauseous or bloated. Drink plenty of fluids but you should avoid alcoholic beverages for 24 hours. If you had a esophageal dilation, please see attached instructions for diet.   ACTIVITY: Your care partner should take you home directly after the procedure. You should plan to take it easy, moving slowly for the rest of the day. You can resume normal activity the day after the procedure however YOU SHOULD NOT DRIVE, use power tools, machinery or perform tasks that involve climbing or major physical exertion for 24 hours (because of the sedation medicines used during the test).   SYMPTOMS TO REPORT IMMEDIATELY: A gastroenterologist can be reached at any hour. Please call [phone]  for any of  the following symptoms:   Following lower endoscopy (colonoscopy, flexible sigmoidoscopy) Excessive amounts of blood in the stool  Significant tenderness, worsening of abdominal pains  Swelling of the abdomen that is new, acute  Fever of 100 or higher   Following upper endoscopy (EGD, EUS, ERCP, esophageal dilation) Vomiting of blood or coffee ground material  New, significant abdominal pain  New, significant chest pain or pain under the shoulder blades  Painful or persistently difficult swallowing  New shortness of breath  Black, tarry-looking or red, bloody stools  FOLLOW UP:  If any biopsies were taken you will be contacted by phone or by letter within the next 1-3 weeks. Call [phone]  if you have not heard about the biopsies in 3 weeks.  Please also call with any specific questions about appointments or follow up tests.

## 2018-02-18 NOTE — Anesthesia Procedure Notes (Signed)
Date/Time: 02/18/2018 8:16 AM Performed by: Talbot Grumbling, CRNA Oxygen Delivery Method: Simple face mask

## 2018-02-18 NOTE — Op Note (Signed)
[facility] Patient Name: Rachel Villarreal Procedure Date: 02/18/2018 MRN: [id number] Attending MD: Clarene Essex , MD Date of Birth: [birth year] CSN: [id number] Age: 72 Admit Type: Outpatient Procedure:                Colonoscopy Indications:              High risk colon cancer surveillance: Personal                            history of colonic polyps, Last colonoscopy: June                            2016 Providers:                Clarene Essex, MD, Burtis Junes, RN, Tinnie Gens,                            Technician, Marla Roe, CRNA Referring MD:              Medicines:                Propofol total dose 540 mg IV, Ondansetron 4 mg IV,                            80 mg IV lidocaine Complications:            No immediate complications. Estimated Blood Loss:     Estimated blood loss: none. Procedure:                Pre-Anesthesia Assessment:                           - Prior to the procedure, a History and Physical                            was performed, and patient medications and                            allergies were reviewed. The patient's tolerance of                            previous anesthesia was also reviewed. The risks                            and benefits of the procedure and the sedation                            options and risks were discussed with the patient.                            All questions were answered, and informed consent                            was obtained. Prior Anticoagulants: The patient has  taken no previous anticoagulant or antiplatelet                            agents. ASA Grade Assessment: II - A patient with                            mild systemic disease. After reviewing the risks                            and benefits, the patient was deemed in                            satisfactory condition to undergo the procedure.                           After obtaining informed consent, the  colonoscope                            was passed under direct vision. Throughout the                            procedure, the patient's blood pressure, pulse, and                            oxygen saturations were monitored continuously. The                            PCF-H190DL ([id number]) Olympus peds colonoscope was                            introduced through the anus and advanced to the the                            ileocolonic anastomosis. The rectum was                            photographed. The colonoscopy was somewhat                            difficult due to restricted mobility of the colon,                            significant looping and a tortuous colon.                            Successful completion of the procedure was aided by                            applying abdominal pressure. The patient tolerated                            the procedure well. The quality of the bowel  preparation was adequate. Scope In: 8:26:08 AM Scope Out: 8:53:36 AM Scope Withdrawal Time: 0 hours 12 minutes 43 seconds  Total Procedure Duration: 0 hours 27 minutes 28 seconds  Findings:      Multiple small and large-mouthed diverticula were found in the sigmoid       colon, descending colon and transverse colon.      There was evidence of a prior side-to-side ileo-colonic anastomosis in       the ascending colon. This was patent and was characterized by healthy       appearing mucosa. The anastomosis was not traversed.      The exam was otherwise without abnormality. No obvious residual polyp       was seen Impression:               - Diverticulosis in the sigmoid colon, in the                            descending colon and in the transverse colon.                           - Patent side-to-side ileo-colonic anastomosis,                            characterized by healthy appearing mucosa.                           - The examination was otherwise normal.                            - No specimens collected. Moderate Sedation:      Not Applicable - Patient had care per Anesthesia. Recommendation:           - Patient has a contact number available for                            emergencies. The signs and symptoms of potential                            delayed complications were discussed with the                            patient. Return to normal activities tomorrow.                            Written discharge instructions were provided to the                            patient.                           - Soft diet today.                           - Continue present medications.                           - Repeat colonoscopy or even virtual colonoscopy in  3 to 5 years just to be sure                           - Return to GI office PRN.                           - Telephone GI clinic if symptomatic PRN. Procedure Code(s):        --- Professional ---                           [phone], Colonoscopy, flexible; diagnostic, including                            collection of specimen(s) by brushing or washing,                            when performed (separate procedure) Diagnosis Code(s):        --- Professional ---                           Z86.010, Personal history of colonic polyps                           Z98.0, Intestinal bypass and anastomosis status                           K57.30, Diverticulosis of large intestine without                            perforation or abscess without bleeding CPT copyright 2018 American [employer] Association. All rights reserved. The codes documented in this report are preliminary and upon coder review may  be revised to meet current compliance requirements. Clarene Essex, MD 02/18/2018 9:05:14 AM This report has been signed electronically. Number of Addenda: 0

## 2018-02-18 NOTE — Progress Notes (Signed)
Rachel Villarreal 8:11 AM  Subjective: Patient without any GI complaints no new medical problems since we recently saw her  Objective: Vital signs stable afebrile exam please see previous assessment evaluation  Assessment: Difficult to remove colon polyps  Plan: Okay to proceed with colonoscopy with anesthesia assistance  [facility]  Pager [phone] After 5PM or if no answer call [phone]

## 2018-02-18 NOTE — Transfer of Care (Signed)
Immediate Anesthesia Transfer of Care Note  Patient: Rachel Villarreal  Procedure(s) Performed: COLONOSCOPY WITH PROPOFOL (N/A )  Patient Location: PACU  Anesthesia Type:MAC  Level of Consciousness: sedated  Airway & Oxygen Therapy: Patient Spontanous Breathing and Patient connected to face mask oxygen  Post-op Assessment: Report given to RN and Post -op Vital signs reviewed and stable  Post vital signs: Reviewed and stable  Last Vitals:  Vitals Value Taken Time  BP    Temp    Pulse    Resp    SpO2      Last Pain:  Vitals:   02/18/18 0658  TempSrc: Oral  PainSc: 0-No pain         Complications: No apparent anesthesia complications

## 2018-02-18 NOTE — Anesthesia Postprocedure Evaluation (Signed)
Anesthesia Post Note  Patient: Rachel Villarreal  Procedure(s) Performed: COLONOSCOPY WITH PROPOFOL (N/A )     Patient location during evaluation: PACU Anesthesia Type: MAC Level of consciousness: awake and alert Pain management: pain level controlled Vital Signs Assessment: post-procedure vital signs reviewed and stable Respiratory status: spontaneous breathing and respiratory function stable Cardiovascular status: stable Postop Assessment: no apparent nausea or vomiting Anesthetic complications: no    Last Vitals:  Vitals:   02/18/18 0910 02/18/18 0920  BP: 130/71 125/61  Pulse: 73 69  Resp: 17 20  Temp:    SpO2: 97% 98%    Last Pain:  Vitals:   02/18/18 0920  TempSrc:   PainSc: 0-No pain                 Lakeyia Surber DANIEL

## 2018-03-12 ENCOUNTER — Other Ambulatory Visit: Payer: Self-pay | Admitting: Internal Medicine

## 2018-03-12 DIAGNOSIS — Z1231 Encounter for screening mammogram for malignant neoplasm of breast: Secondary | ICD-10-CM

## 2018-05-03 ENCOUNTER — Ambulatory Visit: Payer: Medicare Other

## 2018-05-06 ENCOUNTER — Ambulatory Visit
Admission: RE | Admit: 2018-05-06 | Discharge: 2018-05-06 | Disposition: A | Discharge status: Home / Self Care | Payer: Medicare Other | Source: Ambulatory Visit | Attending: Internal Medicine | Admitting: Internal Medicine

## 2018-05-06 DIAGNOSIS — Z1231 Encounter for screening mammogram for malignant neoplasm of breast: Secondary | ICD-10-CM

## 2018-05-08 ENCOUNTER — Other Ambulatory Visit: Payer: Self-pay | Admitting: Internal Medicine

## 2018-05-08 DIAGNOSIS — R928 Other abnormal and inconclusive findings on diagnostic imaging of breast: Secondary | ICD-10-CM

## 2018-05-10 ENCOUNTER — Ambulatory Visit
Admission: RE | Admit: 2018-05-10 | Discharge: 2018-05-10 | Disposition: A | Discharge status: Home / Self Care | Payer: Medicare Other | Source: Ambulatory Visit | Attending: Internal Medicine | Admitting: Internal Medicine

## 2018-05-10 DIAGNOSIS — R928 Other abnormal and inconclusive findings on diagnostic imaging of breast: Secondary | ICD-10-CM

## 2018-09-11 ENCOUNTER — Other Ambulatory Visit: Payer: Self-pay

## 2018-09-16 ENCOUNTER — Ambulatory Visit (INDEPENDENT_AMBULATORY_CARE_PROVIDER_SITE_OTHER): Payer: Medicare Other | Admitting: Obstetrics & Gynecology

## 2018-09-16 ENCOUNTER — Encounter: Payer: Self-pay | Admitting: Obstetrics & Gynecology

## 2018-09-16 ENCOUNTER — Other Ambulatory Visit: Payer: Self-pay

## 2018-09-16 VITALS — BP 148/80 | HR 76 | Temp 97.2°F | Ht 62.0 in | Wt 176.0 lb

## 2018-09-16 DIAGNOSIS — Z01419 Encounter for gynecological examination (general) (routine) without abnormal findings: Secondary | ICD-10-CM

## 2018-09-16 MED ORDER — ESTRADIOL 0.025 MG/24HR TD PTTW: 1.0000 | MEDICATED_PATCH | TRANSDERMAL | 3 refills | Status: DC

## 2018-09-16 NOTE — Progress Notes (Signed)
73 y.o. Rachel Villarreal here for annual exam.  Doing well.  Has been a "standarized patient" at [facility].  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    Denies vaginal bleeding.    Had trip to [place] scheduled for last month that she cancelled.  Also had trip to [place] scheduled for October.  She has decided to postpone this trip to next year as well.    PCP:  Dr. Lysle Rubens.    No LMP recorded. Patient has had a hysterectomy.          Sexually active: No.  The current method of family planning is post menopausal status.    Exercising: Yes.    walk 1.5 miles daily  Smoker:  no  Health Maintenance: Pap:  07/05/17 Neg   12/04/14 neg  History of abnormal Pap:  no MMG:  05/10/18 [place] left BIRADS2:benign. F/u 1 year Colonoscopy:  02/2018, Dr. Watt Climes.  Does not need a future colonoscopy.  Will plan cologuard in 3 years.   BMD:   2016 Osteopenia  TDaP:  2012 Pneumonia vaccine(s):  Done  Shingrix:   Zostavax Hep C testing:  Done with PCP Screening Labs: PCP   reports that she quit smoking about 46 years ago. She has never used smokeless tobacco. She reports that she does not drink alcohol or use drugs.  Past Medical History:  Diagnosis Date  . Asthma    Mild  . Chronic kidney disease (CKD)    stage 2  . GERD (gastroesophageal reflux disease)   . Gout 2/14  . Hernia, incisional   . History of abnormal Pap smear    CIN III, 2001  . History of uterine fibroid   . Hypertension   . Peritonitis ([place])   . Respiratory failure ([place])    after ruptured appendix  . Ruptured appendix   . Seasonal allergies   . Shingles 11/14   very mild case  . Urine incontinence     Past Surgical History:  Procedure Laterality Date  . ABDOMINAL HYSTERECTOMY    . APPENDECTOMY    . BREAST BIOPSY Right    fibrocystic changes  . CATARACT EXTRACTION Bilateral   . COLONOSCOPY    . COLONOSCOPY WITH PROPOFOL N/A 02/18/2018   Procedure: COLONOSCOPY WITH  PROPOFOL;  Surgeon: Clarene Essex, MD;  Location: [facility];  Service: Endoscopy;  Laterality: N/A;  Use ultraslim scope   . REFRACTIVE SURGERY      Current Outpatient Medications  Medication Sig Dispense Refill  . amLODipine (NORVASC) 5 MG tablet Take 5 mg by mouth daily.    Marland Kitchen atorvastatin (LIPITOR) 10 MG tablet Take 10 mg by mouth daily.    . Cholecalciferol (VITAMIN D3) 2000 units TABS Take 2,000 mg by mouth daily.    Marland Kitchen estradiol (VIVELLE-DOT) 0.025 MG/24HR PLACE 1 PATCH ONTO THE SKIN 2 (TWO) TIMES A WEEK. 24 patch 3  . febuxostat (ULORIC) 40 MG tablet Take 40 mg by mouth daily.    Marland Kitchen loratadine (CLARITIN) 10 MG tablet Take 10 mg by mouth 2 (two) times daily.     Marland Kitchen losartan-hydrochlorothiazide (HYZAAR) 100-12.5 MG per tablet Take 1 tablet by mouth daily.    . pantoprazole (PROTONIX) 40 MG tablet Take 40 mg by mouth daily.    Marland Kitchen PROAIR HFA 108 (90 BASE) MCG/ACT inhaler Inhale 2 puffs into the lungs every 6 (six) hours as needed (for respiratory issues.).     Marland Kitchen triamcinolone (NASACORT ALLERGY  24HR) 55 MCG/ACT AERO nasal inhaler Place 2 sprays into the nose daily as needed (for allergies.).     No current facility-administered medications for this visit.     History reviewed. No pertinent family history.  Review of Systems  All other systems reviewed and are negative.   Exam:   BP (!) 148/80   Pulse 76   Temp (!) 97.2 F (36.2 C) (Temporal)   Ht 5\' 2"  (1.575 m)   Wt 176 lb (79.8 kg)   BMI 32.19 kg/m    Height: 5\' 2"  (157.5 cm)  Ht Readings from Last 3 Encounters:  09/16/18 5\' 2"  (1.575 m)  02/18/18 5\' 2"  (1.575 m)  07/05/17 5\' 2"  (1.575 m)    General appearance: alert, cooperative and appears stated age Head: Normocephalic, without obvious abnormality, atraumatic Neck: no adenopathy, supple, symmetrical, trachea midline and thyroid normal to inspection and palpation Lungs: clear to auscultation bilaterally Breasts: normal appearance, no masses or tenderness Heart: regular  rate and rhythm Abdomen: soft, non-tender; bowel sounds normal; no masses,  no organomegaly Extremities: extremities normal, atraumatic, no cyanosis or edema Skin: Skin color, texture, turgor normal. No rashes or lesions Lymph nodes: Cervical, supraclavicular, and axillary nodes normal. No abnormal inguinal nodes palpated Neurologic: Grossly normal   Pelvic: External genitalia:  no lesions              Urethra:  normal appearing urethra with no masses, tenderness or lesions              Bartholins and Skenes: normal                 Vagina: normal appearing vagina with normal color and discharge, no lesions              Cervix: no lesions              Pap taken: No. Bimanual Exam:  Uterus:  normal size, contour, position, consistency, mobility, non-tender              Adnexa: normal adnexa and no mass, fullness, tenderness               Rectovaginal: Confirms               Anus:  normal sphincter tone, no lesions  Chaperone was present for exam.  A:  Well Woman with normal exam PMP, on low dosed HRT H/O TAH, h/o CIN 3 with small portion of cervix remaining H/o colon polyps.  Will have cologuard after 02/2021 Hypertension Elevated lipids Osteopenia  P:   Mammogram guidelines reviewed.  Doing yearly.   pap smear 2019.  Not indicated. Release of records for BMD signed today from Dr. Lysle Rubens Shingrix vaccination discussed.  Other vaccines are UTD. RF for estradiol patch 0.025mg  patches twice weekly.  #24/4RF.  Aware of risks of DVT, PE, stroke, MI. Lab work UTD return annually or prn

## 2018-09-16 NOTE — Patient Instructions (Signed)
Melida Quitter, MD [address] [place], [place], [place] [address]  Phone: [phone]

## 2018-09-17 ENCOUNTER — Telehealth: Payer: Self-pay | Admitting: *Deleted

## 2018-09-17 NOTE — Telephone Encounter (Signed)
Patient notified of BMD results. Report to scan.  

## 2018-11-18 ENCOUNTER — Other Ambulatory Visit: Payer: Self-pay | Admitting: Obstetrics & Gynecology

## 2018-11-18 NOTE — Telephone Encounter (Signed)
Medication refill request: Vivelle-Dot  Last AEX:  09/16/18 SM Next AEX: 11/28/19 Last MMG (if hormonal medication request): 05/06/18 BIRADS 0:Incomplete; 05/10/18 Left Breast MM/US - BIRADS 2 benign/density c  Refill authorized: Please advise; patient needs this prescription transferred to [place]

## 2018-11-18 NOTE — Telephone Encounter (Signed)
Rachel Villarreal from [facility] called to ask that a new Vivelle Dot prescription be called in for patient to [facility] Ph [phone], Fax [phone]. [facility] is unable to transfer prescription from patient's previous pharmacy.

## 2018-11-19 MED ORDER — ESTRADIOL 0.025 MG/24HR TD PTTW: 1.0000 | MEDICATED_PATCH | TRANSDERMAL | 3 refills | Status: DC

## 2018-12-24 ENCOUNTER — Telehealth: Payer: Self-pay

## 2018-12-24 NOTE — Telephone Encounter (Addendum)
Prior Authorization has been started and sent to patient's plan for Estradiol biweekly patches.

## 2019-04-14 ENCOUNTER — Other Ambulatory Visit: Payer: Self-pay | Admitting: Internal Medicine

## 2019-04-14 DIAGNOSIS — Z1231 Encounter for screening mammogram for malignant neoplasm of breast: Secondary | ICD-10-CM

## 2019-05-14 ENCOUNTER — Ambulatory Visit
Admission: RE | Admit: 2019-05-14 | Discharge: 2019-05-14 | Disposition: A | Discharge status: Home / Self Care | Payer: Medicare Other | Source: Ambulatory Visit | Attending: Internal Medicine | Admitting: Internal Medicine

## 2019-05-14 ENCOUNTER — Other Ambulatory Visit: Payer: Self-pay

## 2019-05-14 DIAGNOSIS — Z1231 Encounter for screening mammogram for malignant neoplasm of breast: Secondary | ICD-10-CM

## 2019-10-29 ENCOUNTER — Other Ambulatory Visit: Payer: Self-pay

## 2019-10-29 NOTE — Telephone Encounter (Signed)
Patient returned call and confirmed pharmacy as [facility].   Routing to provider for review.

## 2019-10-29 NOTE — Telephone Encounter (Signed)
Message left to return call to [facility] at [phone].   Need patient to advise what pharmacy Vivelle-dot should be sent to.

## 2019-10-29 NOTE — Telephone Encounter (Signed)
Message left to return call to Debie Ashline at [phone].    

## 2019-10-29 NOTE — Telephone Encounter (Signed)
[facility] called in regards to patient needing refill of Vivelle dot.

## 2019-10-29 NOTE — Telephone Encounter (Signed)
Patient returned a call to [place].

## 2019-10-29 NOTE — Telephone Encounter (Signed)
Medication refill request: Vivelle-dot  Last AEX:  09-16-2018 SM  Next AEX: 11-29-19 Last MMG (if hormonal medication request): 05-14-19 density B/BIRADS 1 negative  Refill authorized: Today, please advise.   Medication pended for #8, 0RF. Please refill if appropriate.

## 2019-10-31 MED ORDER — ESTRADIOL 0.025 MG/24HR TD PTTW: 1.0000 | MEDICATED_PATCH | TRANSDERMAL | 0 refills | Status: DC

## 2019-11-19 NOTE — Progress Notes (Signed)
74 y.o. G0P0000 Divorced White or Caucasian female here breast and pelvic exam.  She has been on HRT for many years.  She is on Vivelle dot 0.025mg  patches twice weekly.  She does not want to stop this.  We reviewed risks and benefits again today.   Denies vaginal bleeding.    Has two trips planned for next year--[place] and [place].  Has been a standardized patient at [facility] since retirement.  Actually did this just once last year but recently got email about needing to do updated training.  PCP:  Dr. Wenda Low.  Last wellness was December.  Had follow up in June and does have appt scheduled for December.    Lab work will be done then.  Colonoscopy management with Dr. Watt Climes, last one 11/19.  BMD management by Dr. Lysle Rubens, last 2016.  Vaccine management is with PCP.  No LMP recorded. Patient has had a hysterectomy.          Sexually active: No.  Last pap smear:  07-05-17 neg History of abnormal Pap:  Yes, h/o LEEP prior to hysterectomy   reports that she quit smoking about 47 years ago. She has never used smokeless tobacco. She reports that she does not drink alcohol and does not use drugs.  Past Medical History:  Diagnosis Date   Abnormal Pap smear of vagina    Asthma    Mild   Chronic kidney disease (CKD)    stage 2   GERD (gastroesophageal reflux disease)    Gout 2/14   Hernia, incisional    History of abnormal Pap smear    CIN III, 2001   History of uterine fibroid    Hypertension    Peritonitis ([place])    Respiratory failure ([place])    after ruptured appendix   Ruptured appendix    Seasonal allergies    Shingles 11/14   very mild case   Urine incontinence     Past Surgical History:  Procedure Laterality Date   ABDOMINAL HYSTERECTOMY     APPENDECTOMY     BREAST BIOPSY Right    fibrocystic changes   CATARACT EXTRACTION Bilateral    COLONOSCOPY     COLONOSCOPY WITH PROPOFOL N/A 02/18/2018   Procedure: COLONOSCOPY WITH PROPOFOL;  Surgeon:  Clarene Essex, MD;  [facility];  Service: Endoscopy;  Laterality: N/A;  Use ultraslim scope    LEEP     REFRACTIVE SURGERY      Current Outpatient Medications  Medication Sig Dispense Refill   amLODipine (NORVASC) 5 MG tablet Take 7.5 mg by mouth daily.      atorvastatin (LIPITOR) 10 MG tablet Take 10 mg by mouth daily.     Cholecalciferol (VITAMIN D3) 2000 units TABS Take 2,000 mg by mouth daily.     estradiol (VIVELLE-DOT) 0.025 MG/24HR Place 1 patch onto the skin 2 (two) times a week. 24 patch 4   febuxostat (ULORIC) 40 MG tablet Take 40 mg by mouth daily.     loratadine (CLARITIN) 10 MG tablet Take 10 mg by mouth 2 (two) times daily.      losartan-hydrochlorothiazide (HYZAAR) 100-12.5 MG per tablet Take 1 tablet by mouth daily.     pantoprazole (PROTONIX) 40 MG tablet Take 40 mg by mouth daily.     PROAIR HFA 108 (90 BASE) MCG/ACT inhaler Inhale 2 puffs into the lungs every 6 (six) hours as needed (for respiratory issues.).      triamcinolone (NASACORT ALLERGY 24HR) 55 MCG/ACT AERO nasal inhaler  Place 2 sprays into the nose daily as needed (for allergies.).     No current facility-administered medications for this visit.    History reviewed. No pertinent family history.  Review of Systems  Constitutional: Negative.   HENT: Negative.   Eyes: Negative.   Respiratory: Negative.   Cardiovascular: Negative.   Gastrointestinal: Negative.   Endocrine: Negative.   Genitourinary: Negative.   Musculoskeletal: Negative.   Skin: Negative.   Allergic/Immunologic: Negative.   Neurological: Negative.   Hematological: Negative.   Psychiatric/Behavioral: Negative.     Exam:   BP 120/78    Pulse 68    Resp 16    Ht 5' 1.75" (1.568 m)    Wt 174 lb (78.9 kg)    BMI 32.08 kg/m   Height: 5' 1.75" (156.8 cm)  General appearance: alert, cooperative and appears stated age Head: Normocephalic, without obvious abnormality, atraumatic Breasts: normal appearance, no masses  or tenderness Abdomen: soft, non-tender; bowel sounds normal; no masses,  no organomegaly, multiple well healed scars Extremities: extremities normal, atraumatic, no cyanosis or edema Skin: Skin color, texture, turgor normal. No rashes or lesions Lymph nodes: Cervical, supraclavicular, and axillary nodes normal. No abnormal inguinal nodes palpated Neurologic: Grossly normal  Pelvic: External genitalia:  no lesions              Urethra:  normal appearing urethra with no masses, tenderness or lesions              Bartholins and Skenes: normal                 Vagina: normal appearing vagina with normal color and discharge, no lesions              Cervix: tiny portion of cervix present              Pap taken: No. Bimanual Exam:  Uterus:  uterus absent              Adnexa: no mass, fullness, tenderness               Rectovaginal: Confirms               Anus:  normal sphincter tone, no lesions  Chaperone, Royal Hawthorn, CMA, was present for exam.  A:  H/O CIN 2/3 with LEEP prior to hysterectomy in 2000 On HRT H/o colon polyps Gout, GERD, stage 2 chronic kidney disease, asthma with follow up with Dr. Lysle Rubens  P:   Mammogram guidelines reviewed.  Doing yearly as on HRT HRT risks reviewed again this year.  She desires to continue HRT until she absolutely has to stop.  RF for estradiol 0.025mg  patches twice weekly.  #24/4RF   pap smear neg 2019.  Will repeat next year per current ASCCP guidelines to follow for 25 years. Follow up 1 year  22 minutes spent in total with pt

## 2019-11-28 ENCOUNTER — Other Ambulatory Visit: Payer: Self-pay

## 2019-11-28 ENCOUNTER — Encounter: Payer: Self-pay | Admitting: Obstetrics & Gynecology

## 2019-11-28 ENCOUNTER — Ambulatory Visit (INDEPENDENT_AMBULATORY_CARE_PROVIDER_SITE_OTHER): Payer: Medicare Other | Admitting: Obstetrics & Gynecology

## 2019-11-28 VITALS — BP 120/78 | HR 68 | Resp 16 | Ht 61.75 in | Wt 174.0 lb

## 2019-11-28 DIAGNOSIS — Z79890 Hormone replacement therapy (postmenopausal): Secondary | ICD-10-CM

## 2019-11-28 DIAGNOSIS — E28319 Asymptomatic premature menopause: Secondary | ICD-10-CM | POA: Diagnosis not present

## 2019-11-28 DIAGNOSIS — Z87410 Personal history of cervical dysplasia: Secondary | ICD-10-CM

## 2019-11-28 DIAGNOSIS — Z01419 Encounter for gynecological examination (general) (routine) without abnormal findings: Secondary | ICD-10-CM

## 2019-11-28 MED ORDER — ESTRADIOL 0.025 MG/24HR TD PTTW: 1.0000 | MEDICATED_PATCH | TRANSDERMAL | 4 refills | Status: DC

## 2019-12-01 ENCOUNTER — Encounter: Payer: Self-pay | Admitting: Obstetrics & Gynecology

## 2019-12-01 ENCOUNTER — Other Ambulatory Visit: Payer: Self-pay | Admitting: Obstetrics & Gynecology

## 2020-01-19 ENCOUNTER — Other Ambulatory Visit: Payer: Self-pay | Admitting: Obstetrics & Gynecology

## 2020-02-04 ENCOUNTER — Other Ambulatory Visit: Payer: Self-pay | Admitting: Obstetrics & Gynecology

## 2020-02-04 ENCOUNTER — Encounter: Payer: Self-pay | Admitting: Obstetrics & Gynecology

## 2020-05-10 DIAGNOSIS — I10 Essential (primary) hypertension: Secondary | ICD-10-CM | POA: Diagnosis not present

## 2020-05-10 DIAGNOSIS — N184 Chronic kidney disease, stage 4 (severe): Secondary | ICD-10-CM | POA: Diagnosis not present

## 2020-05-10 DIAGNOSIS — N183 Chronic kidney disease, stage 3 unspecified: Secondary | ICD-10-CM | POA: Diagnosis not present

## 2020-05-10 DIAGNOSIS — K219 Gastro-esophageal reflux disease without esophagitis: Secondary | ICD-10-CM | POA: Diagnosis not present

## 2020-05-10 DIAGNOSIS — M8580 Other specified disorders of bone density and structure, unspecified site: Secondary | ICD-10-CM | POA: Diagnosis not present

## 2020-05-10 DIAGNOSIS — E7800 Pure hypercholesterolemia, unspecified: Secondary | ICD-10-CM | POA: Diagnosis not present

## 2020-05-13 DIAGNOSIS — K219 Gastro-esophageal reflux disease without esophagitis: Secondary | ICD-10-CM | POA: Diagnosis not present

## 2020-05-13 DIAGNOSIS — M8580 Other specified disorders of bone density and structure, unspecified site: Secondary | ICD-10-CM | POA: Diagnosis not present

## 2020-05-13 DIAGNOSIS — I10 Essential (primary) hypertension: Secondary | ICD-10-CM | POA: Diagnosis not present

## 2020-05-13 DIAGNOSIS — N183 Chronic kidney disease, stage 3 unspecified: Secondary | ICD-10-CM | POA: Diagnosis not present

## 2020-05-13 DIAGNOSIS — N184 Chronic kidney disease, stage 4 (severe): Secondary | ICD-10-CM | POA: Diagnosis not present

## 2020-05-13 DIAGNOSIS — E7800 Pure hypercholesterolemia, unspecified: Secondary | ICD-10-CM | POA: Diagnosis not present

## 2020-05-17 ENCOUNTER — Other Ambulatory Visit: Payer: Self-pay | Admitting: Internal Medicine

## 2020-05-17 DIAGNOSIS — Z1231 Encounter for screening mammogram for malignant neoplasm of breast: Secondary | ICD-10-CM

## 2020-07-05 ENCOUNTER — Ambulatory Visit
Admission: RE | Admit: 2020-07-05 | Discharge: 2020-07-05 | Disposition: A | Discharge status: Home / Self Care | Payer: Medicare Other | Source: Ambulatory Visit | Attending: Internal Medicine | Admitting: Internal Medicine

## 2020-07-05 ENCOUNTER — Other Ambulatory Visit: Payer: Self-pay

## 2020-07-05 DIAGNOSIS — Z1231 Encounter for screening mammogram for malignant neoplasm of breast: Secondary | ICD-10-CM | POA: Diagnosis not present

## 2020-07-06 DIAGNOSIS — I10 Essential (primary) hypertension: Secondary | ICD-10-CM | POA: Diagnosis not present

## 2020-07-06 DIAGNOSIS — N183 Chronic kidney disease, stage 3 unspecified: Secondary | ICD-10-CM | POA: Diagnosis not present

## 2020-07-06 DIAGNOSIS — E7800 Pure hypercholesterolemia, unspecified: Secondary | ICD-10-CM | POA: Diagnosis not present

## 2020-07-06 DIAGNOSIS — K219 Gastro-esophageal reflux disease without esophagitis: Secondary | ICD-10-CM | POA: Diagnosis not present

## 2020-07-06 DIAGNOSIS — M8580 Other specified disorders of bone density and structure, unspecified site: Secondary | ICD-10-CM | POA: Diagnosis not present

## 2020-07-06 DIAGNOSIS — N184 Chronic kidney disease, stage 4 (severe): Secondary | ICD-10-CM | POA: Diagnosis not present

## 2020-07-29 DIAGNOSIS — M8580 Other specified disorders of bone density and structure, unspecified site: Secondary | ICD-10-CM | POA: Diagnosis not present

## 2020-07-29 DIAGNOSIS — E7800 Pure hypercholesterolemia, unspecified: Secondary | ICD-10-CM | POA: Diagnosis not present

## 2020-07-29 DIAGNOSIS — I10 Essential (primary) hypertension: Secondary | ICD-10-CM | POA: Diagnosis not present

## 2020-07-29 DIAGNOSIS — N183 Chronic kidney disease, stage 3 unspecified: Secondary | ICD-10-CM | POA: Diagnosis not present

## 2020-07-29 DIAGNOSIS — K219 Gastro-esophageal reflux disease without esophagitis: Secondary | ICD-10-CM | POA: Diagnosis not present

## 2020-07-29 DIAGNOSIS — N184 Chronic kidney disease, stage 4 (severe): Secondary | ICD-10-CM | POA: Diagnosis not present

## 2020-08-27 DIAGNOSIS — M8580 Other specified disorders of bone density and structure, unspecified site: Secondary | ICD-10-CM | POA: Diagnosis not present

## 2020-08-27 DIAGNOSIS — N184 Chronic kidney disease, stage 4 (severe): Secondary | ICD-10-CM | POA: Diagnosis not present

## 2020-08-27 DIAGNOSIS — E7800 Pure hypercholesterolemia, unspecified: Secondary | ICD-10-CM | POA: Diagnosis not present

## 2020-08-27 DIAGNOSIS — K219 Gastro-esophageal reflux disease without esophagitis: Secondary | ICD-10-CM | POA: Diagnosis not present

## 2020-08-27 DIAGNOSIS — I10 Essential (primary) hypertension: Secondary | ICD-10-CM | POA: Diagnosis not present

## 2020-09-18 DIAGNOSIS — Z20822 Contact with and (suspected) exposure to covid-19: Secondary | ICD-10-CM | POA: Diagnosis not present

## 2020-09-20 DIAGNOSIS — Z20822 Contact with and (suspected) exposure to covid-19: Secondary | ICD-10-CM | POA: Diagnosis not present

## 2020-09-20 DIAGNOSIS — Z711 Person with feared health complaint in whom no diagnosis is made: Secondary | ICD-10-CM | POA: Diagnosis not present

## 2020-09-20 DIAGNOSIS — Z03818 Encounter for observation for suspected exposure to other biological agents ruled out: Secondary | ICD-10-CM | POA: Diagnosis not present

## 2020-10-13 DIAGNOSIS — H353131 Nonexudative age-related macular degeneration, bilateral, early dry stage: Secondary | ICD-10-CM | POA: Diagnosis not present

## 2020-10-13 DIAGNOSIS — H5211 Myopia, right eye: Secondary | ICD-10-CM | POA: Diagnosis not present

## 2020-10-13 DIAGNOSIS — H401111 Primary open-angle glaucoma, right eye, mild stage: Secondary | ICD-10-CM | POA: Diagnosis not present

## 2020-10-13 DIAGNOSIS — H401122 Primary open-angle glaucoma, left eye, moderate stage: Secondary | ICD-10-CM | POA: Diagnosis not present

## 2020-10-14 DIAGNOSIS — E7800 Pure hypercholesterolemia, unspecified: Secondary | ICD-10-CM | POA: Diagnosis not present

## 2020-10-14 DIAGNOSIS — M8580 Other specified disorders of bone density and structure, unspecified site: Secondary | ICD-10-CM | POA: Diagnosis not present

## 2020-10-14 DIAGNOSIS — N184 Chronic kidney disease, stage 4 (severe): Secondary | ICD-10-CM | POA: Diagnosis not present

## 2020-10-14 DIAGNOSIS — I10 Essential (primary) hypertension: Secondary | ICD-10-CM | POA: Diagnosis not present

## 2020-10-14 DIAGNOSIS — K219 Gastro-esophageal reflux disease without esophagitis: Secondary | ICD-10-CM | POA: Diagnosis not present

## 2020-10-14 DIAGNOSIS — M109 Gout, unspecified: Secondary | ICD-10-CM | POA: Diagnosis not present

## 2020-10-26 DIAGNOSIS — K219 Gastro-esophageal reflux disease without esophagitis: Secondary | ICD-10-CM | POA: Diagnosis not present

## 2020-10-26 DIAGNOSIS — N184 Chronic kidney disease, stage 4 (severe): Secondary | ICD-10-CM | POA: Diagnosis not present

## 2020-10-26 DIAGNOSIS — M8580 Other specified disorders of bone density and structure, unspecified site: Secondary | ICD-10-CM | POA: Diagnosis not present

## 2020-10-26 DIAGNOSIS — E7800 Pure hypercholesterolemia, unspecified: Secondary | ICD-10-CM | POA: Diagnosis not present

## 2020-10-26 DIAGNOSIS — N183 Chronic kidney disease, stage 3 unspecified: Secondary | ICD-10-CM | POA: Diagnosis not present

## 2020-10-26 DIAGNOSIS — I10 Essential (primary) hypertension: Secondary | ICD-10-CM | POA: Diagnosis not present

## 2020-11-07 DIAGNOSIS — Z20822 Contact with and (suspected) exposure to covid-19: Secondary | ICD-10-CM | POA: Diagnosis not present

## 2020-12-27 DIAGNOSIS — I10 Essential (primary) hypertension: Secondary | ICD-10-CM | POA: Diagnosis not present

## 2020-12-27 DIAGNOSIS — K219 Gastro-esophageal reflux disease without esophagitis: Secondary | ICD-10-CM | POA: Diagnosis not present

## 2020-12-27 DIAGNOSIS — E7800 Pure hypercholesterolemia, unspecified: Secondary | ICD-10-CM | POA: Diagnosis not present

## 2020-12-27 DIAGNOSIS — N184 Chronic kidney disease, stage 4 (severe): Secondary | ICD-10-CM | POA: Diagnosis not present

## 2020-12-27 DIAGNOSIS — M8580 Other specified disorders of bone density and structure, unspecified site: Secondary | ICD-10-CM | POA: Diagnosis not present

## 2021-01-04 ENCOUNTER — Ambulatory Visit (HOSPITAL_BASED_OUTPATIENT_CLINIC_OR_DEPARTMENT_OTHER): Payer: Medicare Other | Admitting: Obstetrics & Gynecology

## 2021-01-20 ENCOUNTER — Other Ambulatory Visit: Payer: Self-pay

## 2021-01-20 ENCOUNTER — Other Ambulatory Visit (HOSPITAL_COMMUNITY)
Admission: RE | Admit: 2021-01-20 | Discharge: 2021-01-20 | Disposition: A | Discharge status: Home / Self Care | Payer: Medicare Other | Source: Ambulatory Visit | Attending: Obstetrics & Gynecology | Admitting: Obstetrics & Gynecology

## 2021-01-20 ENCOUNTER — Ambulatory Visit (INDEPENDENT_AMBULATORY_CARE_PROVIDER_SITE_OTHER): Payer: Medicare Other | Admitting: Obstetrics & Gynecology

## 2021-01-20 ENCOUNTER — Encounter (HOSPITAL_BASED_OUTPATIENT_CLINIC_OR_DEPARTMENT_OTHER): Payer: Self-pay | Admitting: Obstetrics & Gynecology

## 2021-01-20 VITALS — BP 169/67 | HR 93 | Ht 61.25 in | Wt 173.6 lb

## 2021-01-20 DIAGNOSIS — Z1151 Encounter for screening for human papillomavirus (HPV): Secondary | ICD-10-CM | POA: Diagnosis not present

## 2021-01-20 DIAGNOSIS — E559 Vitamin D deficiency, unspecified: Secondary | ICD-10-CM

## 2021-01-20 DIAGNOSIS — Z124 Encounter for screening for malignant neoplasm of cervix: Secondary | ICD-10-CM | POA: Diagnosis not present

## 2021-01-20 DIAGNOSIS — Z01419 Encounter for gynecological examination (general) (routine) without abnormal findings: Secondary | ICD-10-CM | POA: Diagnosis not present

## 2021-01-20 DIAGNOSIS — J309 Allergic rhinitis, unspecified: Secondary | ICD-10-CM | POA: Insufficient documentation

## 2021-01-20 DIAGNOSIS — Z79890 Hormone replacement therapy (postmenopausal): Secondary | ICD-10-CM | POA: Diagnosis not present

## 2021-01-20 DIAGNOSIS — Z1211 Encounter for screening for malignant neoplasm of colon: Secondary | ICD-10-CM | POA: Diagnosis not present

## 2021-01-20 DIAGNOSIS — N184 Chronic kidney disease, stage 4 (severe): Secondary | ICD-10-CM | POA: Insufficient documentation

## 2021-01-20 DIAGNOSIS — M8580 Other specified disorders of bone density and structure, unspecified site: Secondary | ICD-10-CM | POA: Insufficient documentation

## 2021-01-20 DIAGNOSIS — E7800 Pure hypercholesterolemia, unspecified: Secondary | ICD-10-CM | POA: Insufficient documentation

## 2021-01-20 DIAGNOSIS — K219 Gastro-esophageal reflux disease without esophagitis: Secondary | ICD-10-CM | POA: Insufficient documentation

## 2021-01-20 MED ORDER — ESTRADIOL 0.025 MG/24HR TD PTTW: 1.0000 | MEDICATED_PATCH | TRANSDERMAL | 4 refills | Status: DC

## 2021-01-20 NOTE — Progress Notes (Signed)
75 y.o. G0P0000 Divorced White or Caucasian female here for breast and pelvic exam.  Doing well.  Denies vaginal bleeding.  I am also following her for history of cervical dysplasia in 2000.  H/o hysterectomy.  She just got back from [place].  This was postponed from June due to having Covid.  Also went to [place] in August.    No LMP recorded. Patient has had a hysterectomy.          Sexually active: No.  H/O STD:  no  Health Maintenance: PCP:  Dr. Lysle Rubens.  Last wellness appt was 03/2020.  Did blood work at that appt:  yes Vaccines are up to date:  yes Colonoscopy:  02/18/18 MMG:  07/05/20 neg BMD:  11/13/17 osteopenia Last pap smear:  07/05/17 neg.   H/o abnormal pap smear:  yes, h/o LEEP 2000   reports that she quit smoking about 48 years ago. She has never used smokeless tobacco. She reports that she does not drink alcohol and does not use drugs.  Past Medical History:  Diagnosis Date   Abnormal Pap smear of vagina    Asthma    Mild   Chronic kidney disease (CKD)    stage 2   GERD (gastroesophageal reflux disease)    Gout 2/14   Hernia, incisional    History of abnormal Pap smear    CIN III, 2001   History of uterine fibroid    Hypertension    Peritonitis ([place])    Respiratory failure ([place])    after ruptured appendix   Ruptured appendix    Seasonal allergies    Shingles 11/14   very mild case   Urine incontinence     Past Surgical History:  Procedure Laterality Date   ABDOMINAL HYSTERECTOMY     APPENDECTOMY     BREAST BIOPSY Right    fibrocystic changes   CATARACT EXTRACTION Bilateral    COLONOSCOPY     COLONOSCOPY WITH PROPOFOL N/A 02/18/2018   Procedure: COLONOSCOPY WITH PROPOFOL;  Surgeon: Clarene Essex, MD;  [facility];  Service: Endoscopy;  Laterality: N/A;  Use ultraslim scope    LEEP  2000   CIN 2/3   REFRACTIVE SURGERY      Current Outpatient Medications  Medication Sig Dispense Refill   amLODipine (NORVASC) 5 MG tablet Take 7.5 mg by  mouth daily.      atorvastatin (LIPITOR) 10 MG tablet Take 10 mg by mouth daily.     Cholecalciferol (VITAMIN D3) 2000 units TABS Take 2,000 mg by mouth daily.     estradiol (VIVELLE-DOT) 0.025 MG/24HR Place 1 patch onto the skin 2 (two) times a week. 24 patch 4   febuxostat (ULORIC) 40 MG tablet Take 40 mg by mouth daily.     loratadine (CLARITIN) 10 MG tablet Take 10 mg by mouth 2 (two) times daily.      losartan-hydrochlorothiazide (HYZAAR) 100-12.5 MG per tablet Take 1 tablet by mouth daily.     pantoprazole (PROTONIX) 40 MG tablet Take 40 mg by mouth daily.     PROAIR HFA 108 (90 BASE) MCG/ACT inhaler Inhale 2 puffs into the lungs every 6 (six) hours as needed (for respiratory issues.).      triamcinolone (NASACORT) 55 MCG/ACT AERO nasal inhaler Place 2 sprays into the nose daily as needed (for allergies.).     No current facility-administered medications for this visit.    History reviewed. No pertinent family history.  Review of Systems  All other systems reviewed and  are negative.  Exam:   BP (!) 169/67   Pulse 93   Ht 5' 1.25" (1.556 m)   Wt 173 lb 9.6 oz (78.7 kg)   BMI 32.53 kg/m   Height: 5' 1.25" (155.6 cm)  General appearance: alert, cooperative and appears stated age Breasts: normal appearance, no masses or tenderness Abdomen: soft, non-tender; bowel sounds normal; no masses,  no organomegaly Lymph nodes: Cervical, supraclavicular, and axillary nodes normal.  No abnormal inguinal nodes palpated Neurologic: Grossly normal  Pelvic: External genitalia:  no lesions              Urethra:  normal appearing urethra with no masses, tenderness or lesions              Bartholins and Skenes: normal                 Vagina: normal appearing vagina with atrophic changes and no discharge, no lesions              Cervix: absent              Pap taken: Yes.   Bimanual Exam:  Uterus:  uterus absent              Adnexa: no mass, fullness, tenderness               Rectovaginal:  Confirms               Anus:  normal sphincter tone, no lesions  Chaperone, Ezekiel Ina, RN, was present for exam.  Assessment/Plan: 1. Encntr for gyn exam (general) (routine) w/o abn findings - Pap smear obtained today - MMG 06/2020 - Colonoscopy 2019 - BMD done 2019 - lab work done with DR. Beaver reviewed/updated  2. Colon cancer screening - Cologuard  3. Hormone replacement therapy (HRT) - RF for estradiol patch done today.  Pt desires to continue.  Risks including DVT, PE, stroke, breast cancer all reviewed  4. Vitamin D deficienc  5. Post-menopause on HRT (hormone replacement therapy)  6. Cervical cancer screening in pt with h/o abnormal pap smears and LEEP in 2000 - Cytology - PAP( [place])

## 2021-01-24 LAB — CYTOLOGY - PAP
Comment: NEGATIVE
Diagnosis: NEGATIVE
High risk HPV: NEGATIVE

## 2021-01-30 DIAGNOSIS — Z1211 Encounter for screening for malignant neoplasm of colon: Secondary | ICD-10-CM | POA: Diagnosis not present

## 2021-02-04 LAB — COLOGUARD: COLOGUARD: NEGATIVE

## 2021-02-07 DIAGNOSIS — N184 Chronic kidney disease, stage 4 (severe): Secondary | ICD-10-CM | POA: Diagnosis not present

## 2021-02-07 DIAGNOSIS — E7800 Pure hypercholesterolemia, unspecified: Secondary | ICD-10-CM | POA: Diagnosis not present

## 2021-02-07 DIAGNOSIS — M8580 Other specified disorders of bone density and structure, unspecified site: Secondary | ICD-10-CM | POA: Diagnosis not present

## 2021-02-07 DIAGNOSIS — K219 Gastro-esophageal reflux disease without esophagitis: Secondary | ICD-10-CM | POA: Diagnosis not present

## 2021-02-07 DIAGNOSIS — I10 Essential (primary) hypertension: Secondary | ICD-10-CM | POA: Diagnosis not present

## 2021-03-15 DIAGNOSIS — M109 Gout, unspecified: Secondary | ICD-10-CM | POA: Diagnosis not present

## 2021-03-15 DIAGNOSIS — K219 Gastro-esophageal reflux disease without esophagitis: Secondary | ICD-10-CM | POA: Diagnosis not present

## 2021-03-15 DIAGNOSIS — N184 Chronic kidney disease, stage 4 (severe): Secondary | ICD-10-CM | POA: Diagnosis not present

## 2021-03-15 DIAGNOSIS — M8580 Other specified disorders of bone density and structure, unspecified site: Secondary | ICD-10-CM | POA: Diagnosis not present

## 2021-03-15 DIAGNOSIS — E559 Vitamin D deficiency, unspecified: Secondary | ICD-10-CM | POA: Diagnosis not present

## 2021-03-15 DIAGNOSIS — Z1389 Encounter for screening for other disorder: Secondary | ICD-10-CM | POA: Diagnosis not present

## 2021-03-15 DIAGNOSIS — Z0000 Encounter for general adult medical examination without abnormal findings: Secondary | ICD-10-CM | POA: Diagnosis not present

## 2021-03-15 DIAGNOSIS — E7800 Pure hypercholesterolemia, unspecified: Secondary | ICD-10-CM | POA: Diagnosis not present

## 2021-03-15 DIAGNOSIS — I10 Essential (primary) hypertension: Secondary | ICD-10-CM | POA: Diagnosis not present

## 2021-04-19 DIAGNOSIS — H401122 Primary open-angle glaucoma, left eye, moderate stage: Secondary | ICD-10-CM | POA: Diagnosis not present

## 2021-04-19 DIAGNOSIS — H401111 Primary open-angle glaucoma, right eye, mild stage: Secondary | ICD-10-CM | POA: Diagnosis not present

## 2021-04-26 DIAGNOSIS — N184 Chronic kidney disease, stage 4 (severe): Secondary | ICD-10-CM | POA: Diagnosis not present

## 2021-04-26 DIAGNOSIS — L814 Other melanin hyperpigmentation: Secondary | ICD-10-CM | POA: Diagnosis not present

## 2021-04-26 DIAGNOSIS — L821 Other seborrheic keratosis: Secondary | ICD-10-CM | POA: Diagnosis not present

## 2021-04-26 DIAGNOSIS — L578 Other skin changes due to chronic exposure to nonionizing radiation: Secondary | ICD-10-CM | POA: Diagnosis not present

## 2021-04-26 DIAGNOSIS — Z23 Encounter for immunization: Secondary | ICD-10-CM | POA: Diagnosis not present

## 2021-04-26 DIAGNOSIS — E7800 Pure hypercholesterolemia, unspecified: Secondary | ICD-10-CM | POA: Diagnosis not present

## 2021-04-26 DIAGNOSIS — D225 Melanocytic nevi of trunk: Secondary | ICD-10-CM | POA: Diagnosis not present

## 2021-04-26 DIAGNOSIS — M8580 Other specified disorders of bone density and structure, unspecified site: Secondary | ICD-10-CM | POA: Diagnosis not present

## 2021-04-26 DIAGNOSIS — I10 Essential (primary) hypertension: Secondary | ICD-10-CM | POA: Diagnosis not present

## 2021-06-20 ENCOUNTER — Other Ambulatory Visit: Payer: Self-pay | Admitting: Internal Medicine

## 2021-06-20 DIAGNOSIS — Z1231 Encounter for screening mammogram for malignant neoplasm of breast: Secondary | ICD-10-CM

## 2021-07-06 ENCOUNTER — Ambulatory Visit
Admission: RE | Admit: 2021-07-06 | Discharge: 2021-07-06 | Disposition: A | Discharge status: Home / Self Care | Payer: Medicare Other | Source: Ambulatory Visit | Attending: Internal Medicine | Admitting: Internal Medicine

## 2021-07-06 DIAGNOSIS — Z1231 Encounter for screening mammogram for malignant neoplasm of breast: Secondary | ICD-10-CM

## 2021-07-07 ENCOUNTER — Other Ambulatory Visit: Payer: Self-pay | Admitting: Internal Medicine

## 2021-07-07 DIAGNOSIS — R928 Other abnormal and inconclusive findings on diagnostic imaging of breast: Secondary | ICD-10-CM

## 2021-07-22 ENCOUNTER — Ambulatory Visit
Admission: RE | Admit: 2021-07-22 | Discharge: 2021-07-22 | Disposition: A | Discharge status: Home / Self Care | Payer: Medicare Other | Source: Ambulatory Visit | Attending: Internal Medicine | Admitting: Internal Medicine

## 2021-07-22 ENCOUNTER — Other Ambulatory Visit: Payer: Self-pay | Admitting: Internal Medicine

## 2021-07-22 DIAGNOSIS — N184 Chronic kidney disease, stage 4 (severe): Secondary | ICD-10-CM | POA: Diagnosis not present

## 2021-07-22 DIAGNOSIS — K219 Gastro-esophageal reflux disease without esophagitis: Secondary | ICD-10-CM | POA: Diagnosis not present

## 2021-07-22 DIAGNOSIS — N6321 Unspecified lump in the left breast, upper outer quadrant: Secondary | ICD-10-CM | POA: Diagnosis not present

## 2021-07-22 DIAGNOSIS — R922 Inconclusive mammogram: Secondary | ICD-10-CM | POA: Diagnosis not present

## 2021-07-22 DIAGNOSIS — N6489 Other specified disorders of breast: Secondary | ICD-10-CM

## 2021-07-22 DIAGNOSIS — R928 Other abnormal and inconclusive findings on diagnostic imaging of breast: Secondary | ICD-10-CM

## 2021-07-22 DIAGNOSIS — I10 Essential (primary) hypertension: Secondary | ICD-10-CM | POA: Diagnosis not present

## 2021-07-22 DIAGNOSIS — E7800 Pure hypercholesterolemia, unspecified: Secondary | ICD-10-CM | POA: Diagnosis not present

## 2021-08-02 ENCOUNTER — Ambulatory Visit
Admission: RE | Admit: 2021-08-02 | Discharge: 2021-08-02 | Disposition: A | Discharge status: Home / Self Care | Payer: Medicare Other | Source: Ambulatory Visit | Attending: Internal Medicine | Admitting: Internal Medicine

## 2021-08-02 DIAGNOSIS — N6489 Other specified disorders of breast: Secondary | ICD-10-CM

## 2021-08-02 DIAGNOSIS — C50412 Malignant neoplasm of upper-outer quadrant of left female breast: Secondary | ICD-10-CM | POA: Diagnosis not present

## 2021-08-02 DIAGNOSIS — N6321 Unspecified lump in the left breast, upper outer quadrant: Secondary | ICD-10-CM | POA: Diagnosis not present

## 2021-08-02 HISTORY — PX: BREAST BIOPSY: SHX20

## 2021-08-19 ENCOUNTER — Other Ambulatory Visit: Payer: Self-pay | Admitting: General Surgery

## 2021-08-19 ENCOUNTER — Telehealth: Payer: Self-pay | Admitting: Hematology and Oncology

## 2021-08-19 DIAGNOSIS — Z170 Estrogen receptor positive status [ER+]: Secondary | ICD-10-CM

## 2021-08-19 DIAGNOSIS — C50412 Malignant neoplasm of upper-outer quadrant of left female breast: Secondary | ICD-10-CM | POA: Diagnosis not present

## 2021-08-19 NOTE — Telephone Encounter (Signed)
Scheduled appointment per 05/12 referral. Patient is aware of appointment date and time. Patient is aware to arrive 15 mins prior to appointment time and to bring updated insurance cards. Patient is aware of location.   ?

## 2021-08-22 ENCOUNTER — Other Ambulatory Visit: Payer: Self-pay | Admitting: General Surgery

## 2021-08-22 DIAGNOSIS — C50412 Malignant neoplasm of upper-outer quadrant of left female breast: Secondary | ICD-10-CM

## 2021-08-26 NOTE — Progress Notes (Signed)
Location of Breast Cancer:  Malignant neoplasm of upper-outer quadrant of left breast in female, estrogen receptor positive  Histology per Pathology Report:  (Definitive pathology pending upcoming surgery) 08/02/2021 Diagnosis Breast, left, needle core biopsy, 2:00 9 cmfn - INVASIVE MAMMARY CARCINOMA. SEE NOTE Diagnosis Note Carcinoma measures 0.7 cm in greatest linear dimension and appears grade 2.  Receptor Status: ER(100%), PR (95%), Her2-neu (Negative via [place]), Ki-67(5%)  Did patient present with symptoms (if so, please note symptoms) or was this found on screening mammography?: Patient underwent a screening mammogram that shows C density breast. There is a left-sided distortion noted. On ultrasound there is a mass in the upper outer quadrant that measures 1.1 x 1 x 0.7 cm. Her axillary ultrasound is negative.  Past/Anticipated interventions by surgeon, if any:  09/12/2021 --Dr. Rolm Bookbinder  Scheduled for: LEFT BREAST LUMPECTOMY WITH RADIOACTIVE SEED LOCALIZATION  08/19/2021 --Dr. Rolm Bookbinder (office visit) We discussed the staging and pathophysiology of breast cancer.  We discussed all of the different options for treatment for breast cancer including surgery, chemotherapy, radiation therapy, Herceptin, and antiestrogen therapy. We discussed omitting sn biopsy based on choosing wisely and SOUND trial data. She is agreeable to this.  We discussed the options for treatment of the breast cancer which included lumpectomy versus a mastectomy.  The decision for lumpectomy vs mastectomy has no impact on decision for chemotherapy.  We discussed that there is no difference in her survival whether she undergoes lumpectomy with radiation therapy or antiestrogen therapy versus a mastectomy.  There is also no real difference between her recurrence in the breast. We discussed the risks of operation including bleeding, infection, possible reoperation.  She understands her further  therapy will be based on what her stages at the time of her operation.   Past/Anticipated interventions by medical oncology, if any:  Under care of Dr. Nicholas Lose 08/29/2021 --Recommendations: Breast conserving surgery followed by (surgery plan for 09/12/2021) Adjuvant radiation therapy followed by Adjuvant antiestrogen therapy --She is a retired Software engineer and loves to travel. --Return to clinic after surgery to discuss final pathology report. -- If she does not get radiation and we will start her on antiestrogen therapy soon after surgery.  Lymphedema issues, if any:  None    Pain issues, if any:  Patient denies   SAFETY ISSUES: Prior radiation? No Pacemaker/ICD? No Possible current pregnancy? No--hysterectomy Is the patient on methotrexate? No  Current Complaints / other details:  Nothing else of note

## 2021-08-29 ENCOUNTER — Inpatient Hospital Stay: Payer: Medicare Other

## 2021-08-29 ENCOUNTER — Other Ambulatory Visit: Payer: Self-pay

## 2021-08-29 ENCOUNTER — Inpatient Hospital Stay: Payer: Medicare Other | Attending: Hematology and Oncology | Admitting: Hematology and Oncology

## 2021-08-29 DIAGNOSIS — C50412 Malignant neoplasm of upper-outer quadrant of left female breast: Secondary | ICD-10-CM | POA: Insufficient documentation

## 2021-08-29 DIAGNOSIS — Z170 Estrogen receptor positive status [ER+]: Secondary | ICD-10-CM | POA: Diagnosis not present

## 2021-08-29 MED ORDER — MELATONIN 5 MG PO TABS
5.0000 mg | ORAL_TABLET | Freq: Every evening | ORAL | 0 refills | Status: DC | PRN
Start: 2021-08-29 — End: 2023-04-17

## 2021-08-29 NOTE — Assessment & Plan Note (Addendum)
08/02/2021: Screening mammogram detected left breast distortion measuring 1.1 cm by ultrasound, negative axillary lymph nodes.  Biopsy revealed grade 2 ILC ER 100%, PR 95%, Ki-67 5%, HER2 IHC 2+, negative ratio 1.29  Pathology and radiology counseling:Discussed with the patient, the details of pathology including the type of breast cancer,the clinical staging, the significance of ER, PR and HER-2/neu receptors and the implications for treatment. After reviewing the pathology in detail, we proceeded to discuss the different treatment options between surgery, radiation, chemotherapy, antiestrogen therapies.  Recommendations: 1. Breast conserving surgery followed by (surgery plan for 09/12/2021) 2. Adjuvant radiation therapy followed by 3. Adjuvant antiestrogen therapy  She is a retired Software engineer and loves to travel.  Return to clinic after surgery to discuss final pathology report

## 2021-08-29 NOTE — Progress Notes (Signed)
[place] NOTE  Patient Care Team: Wenda Low, MD as PCP - General (Internal Medicine) [name], MD as Consulting Physician (Gynecology)  CHIEF COMPLAINTS/PURPOSE OF CONSULTATION:  Newly diagnosed breast cancer  HISTORY OF PRESENTING ILLNESS:  Rachel Villarreal 76 y.o. female is here because of recent diagnosis of left breast cancer.  Patient had routine screening mammogram that detected left breast abnormality but ultrasound measured 1.1 cm.  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  She was seen by Dr. Donne Hazel with surgery who recommended lumpectomy and she has an appointment tomorrow to see radiation oncology.  She was sent to [place] for discussion regarding adjuvant treatment options.  I reviewed her records extensively and collaborated the history with the patient.  SUMMARY OF ONCOLOGIC HISTORY: Oncology History  Malignant neoplasm of upper-outer quadrant of left breast in female, estrogen receptor positive ([place])  08/02/2021 Initial Diagnosis   Screening mammogram detected left breast distortion measuring 1.1 cm by ultrasound, negative axillary lymph nodes.  Biopsy revealed grade 2 ILC ER 100%, PR 95%, Ki-67 5%, HER2 IHC 2+, negative ratio 1.29      MEDICAL HISTORY:  Past Medical History:  Diagnosis Date   Abnormal Pap smear of vagina    Asthma    Mild   Chronic kidney disease (CKD)    stage 2   GERD (gastroesophageal reflux disease)    Gout 2/14   Hernia, incisional    History of abnormal Pap smear    CIN III, 2001   History of uterine fibroid    Hypertension    Peritonitis ([place])    Respiratory failure ([place])    after ruptured appendix   Ruptured appendix    Seasonal allergies    Shingles 11/14   very mild case   Urine incontinence     SURGICAL HISTORY: Past Surgical History:  Procedure Laterality Date   APPENDECTOMY     BREAST BIOPSY Right    fibrocystic changes    BREAST BIOPSY Left 08/02/2021   CATARACT EXTRACTION Bilateral    COLONOSCOPY     COLONOSCOPY WITH PROPOFOL N/A 02/18/2018   Procedure: COLONOSCOPY WITH PROPOFOL;  Surgeon: Clarene Essex, MD;  Location: [facility];  Service: Endoscopy;  Laterality: N/A;  Use ultraslim scope    LEEP  2000   CIN 2/3   REFRACTIVE SURGERY     SUPRACERVICAL ABDOMINAL HYSTERECTOMY  2001    SOCIAL HISTORY: Social History   Socioeconomic History   Marital status: Divorced    Spouse name: Not on file   Number of children: Not on file   Years of education: Not on file   Highest education level: Not on file  Occupational History   Not on file  Tobacco Use   Smoking status: Former    Types: Cigarettes    Quit date: 04/10/1972    Years since quitting: 49.4   Smokeless tobacco: Never  Vaping Use   Vaping Use: Never used  Substance and Sexual Activity   Alcohol use: No   Drug use: No   Sexual activity: Not Currently    Partners: Male    Birth control/protection: Surgical    Comment: hysterectomy  Other Topics Concern   Not on file  Social History Narrative   Not on file   Social Determinants of Health   Financial Resource Strain: Not on file  Food Insecurity: Not on file  Transportation Needs: Not on file  Physical Activity: Not on file  Stress: Not on file  Social Connections: Not on file  Intimate Partner Violence: Not on file    FAMILY HISTORY: Family History  Problem Relation Age of Onset   Breast cancer Neg Hx     ALLERGIES:  is allergic to ace inhibitors, lisinopril, mercury detox [nutritional supplements], and tape.  MEDICATIONS:  Current Outpatient Medications  Medication Sig Dispense Refill   melatonin 5 MG TABS Take 1 tablet (5 mg total) by mouth at bedtime as needed.  0   amLODipine (NORVASC) 5 MG tablet Take 7.5 mg by mouth daily.      atorvastatin (LIPITOR) 10 MG tablet Take 10 mg by mouth daily.     Cholecalciferol (VITAMIN D3) 2000 units TABS Take 2,000 mg by mouth  daily.     febuxostat (ULORIC) 40 MG tablet Take 40 mg by mouth daily.     loratadine (CLARITIN) 10 MG tablet Take 10 mg by mouth 2 (two) times daily.      losartan-hydrochlorothiazide (HYZAAR) 100-12.5 MG per tablet Take 1 tablet by mouth daily.     pantoprazole (PROTONIX) 40 MG tablet Take 40 mg by mouth daily.     PROAIR HFA 108 (90 BASE) MCG/ACT inhaler Inhale 2 puffs into the lungs every 6 (six) hours as needed (for respiratory issues.).      triamcinolone (NASACORT) 55 MCG/ACT AERO nasal inhaler Place 2 sprays into the nose daily as needed (for allergies.).     No current facility-administered medications for this visit.    REVIEW OF SYSTEMS:   Constitutional: Denies fevers, chills or abnormal night sweats   All other systems were reviewed with the patient and are negative.  PHYSICAL EXAMINATION: ECOG PERFORMANCE STATUS: 0 - Asymptomatic  Vitals:   08/29/21 1147  BP: (!) 143/69  Pulse: 87  Resp: 16  Temp: 97.9 F (36.6 C)  SpO2: 100%   Filed Weights   08/29/21 1147  Weight: 165 lb 3.2 oz (74.9 kg)       RADIOGRAPHIC STUDIES: I have personally reviewed the radiological reports and agreed with the findings in the report.  ASSESSMENT AND PLAN:  Malignant neoplasm of upper-outer quadrant of left breast in female, estrogen receptor positive ([place]) 08/02/2021: Screening mammogram detected left breast distortion measuring 1.1 cm by ultrasound, negative axillary lymph nodes.  Biopsy revealed grade 2 ILC ER 100%, PR 95%, Ki-67 5%, HER2 IHC 2+, negative ratio 1.29  Pathology and radiology counseling:Discussed with the patient, the details of pathology including the type of breast cancer,the clinical staging, the significance of ER, PR and HER-2/neu receptors and the implications for treatment. After reviewing the pathology in detail, we proceeded to discuss the different treatment options between surgery, radiation, chemotherapy, antiestrogen therapies.  Recommendations: 1.  Breast conserving surgery followed by (surgery plan for 09/12/2021) 2. Adjuvant radiation therapy followed by 3. Adjuvant antiestrogen therapy  She is a retired Software engineer and loves to travel. Return to clinic after surgery to discuss final pathology report.  She will make a decision regarding radiation after discussing with radiation oncology.  If she does not get radiation and we will start her on antiestrogen therapy soon after surgery.  All questions were answered. The patient knows to call the clinic with any problems, questions or concerns.    Harriette Ohara, MD 08/29/21

## 2021-08-29 NOTE — Progress Notes (Signed)
Radiation Oncology         [phone] ________________________________  Initial Outpatient Consultation  Name: Rachel Villarreal MRN: [id number]  Date: 08/30/2021  DOB: 04/02/1946  CC:Husain, Karrar, MD  Wakefield, Matthew, MD   REFERRING PHYSICIAN: Wakefield, Matthew, MD  DIAGNOSIS: No diagnosis found.  Left Breast UOQ Invasive Lobular Carcinoma, ER+ / PR+ / Her2-, Grade 2   Cancer Staging  Malignant neoplasm of upper-outer quadrant of left breast in female, estrogen receptor positive (HCC) Staging form: Breast, AJCC 8th Edition - Clinical stage from 08/29/2021: Stage IA (cT1c, cN0, cM0, G2, ER+, PR+, HER2-) - Signed by Gudena, Vinay, MD on 08/29/2021  CHIEF COMPLAINT: Here to discuss management of left breast cancer  HISTORY OF PRESENT ILLNESS::Rachel Villarreal is a 75 y.o. female who presented with a left breast abnormality on the following imaging: bilateral screening mammogram on the date of 07/06/21.  No symptoms, if any, were reported at that time.   Diagnostic left breast mammogram and left breast ultrasound on 07/22/21 further revealed an indistinct mass with associated posterior acoustic shadowing in the 2 o'clock position of the left breast, 9 cmfn, measuring 1.1 x 1.0 x 0.7 cm. Left axilla showed no evidence of adenopathy.   Biopsy of the 2 o'clock right breast on date of 08/02/21 showed grade 2 invasive lobular carcinoma measuring 0.7 cm in the greatest linear extent .  ER status: 100% positive; PR status 95% positive (both with strong staining intensity); Proliferation marker Ki67 at 5%; Her2 status negative; Grade 2. No lymph nodes were examined.  Accordingly, the patient was referred to Dr. Wakefield on 08/19/21 to review treatment options. Following discussion of the risks and benefits, the patient has agreed to proceed with breast conserving surgery without SLN biopsies. She is scheduled for surgery on 09/12/21 under Dr. Wakefield.   The patient met with Dr. Gudena  yesterday to discuss further treatment options. Following review of the risks and benefits, the patient is agreeable to proceed with antiestrogens. If she does not decided to pursue XRT after our discussion today, Dr. Gudena would like to start her on antiestrogens soon after surgery.   Of note: the patient has a prior history of a benign right breast biopsy.  ***  PREVIOUS RADIATION THERAPY: No  PAST MEDICAL HISTORY:  has a past medical history of Abnormal Pap smear of vagina, Asthma, Chronic kidney disease (CKD), GERD (gastroesophageal reflux disease), Gout (2/14), Hernia, incisional, History of abnormal Pap smear, History of uterine fibroid, Hypertension, Peritonitis (HCC), Respiratory failure (HCC), Ruptured appendix, Seasonal allergies, Shingles (11/14), and Urine incontinence.    PAST SURGICAL HISTORY: Past Surgical History:  Procedure Laterality Date   APPENDECTOMY     BREAST BIOPSY Right    fibrocystic changes   BREAST BIOPSY Left 08/02/2021   CATARACT EXTRACTION Bilateral    COLONOSCOPY     COLONOSCOPY WITH PROPOFOL N/A 02/18/2018   Procedure: COLONOSCOPY WITH PROPOFOL;  Surgeon: Magod, Marc, MD;  Location: [facility];  Service: Endoscopy;  Laterality: N/A;  Use ultraslim scope    LEEP  2000   CIN 2/3   REFRACTIVE SURGERY     SUPRACERVICAL ABDOMINAL HYSTERECTOMY  2001    FAMILY HISTORY: family history is not on file.  SOCIAL HISTORY:  reports that she quit smoking about 49 years ago. She has never used smokeless tobacco. She reports that she does not drink alcohol and does not use drugs.  ALLERGIES: Ace inhibitors, Lisinopril, Mercury detox [nutritional supplements], and Tape  MEDICATIONS:  Current   Outpatient Medications  Medication Sig Dispense Refill   amLODipine (NORVASC) 5 MG tablet Take 7.5 mg by mouth daily.      atorvastatin (LIPITOR) 10 MG tablet Take 10 mg by mouth daily.     Cholecalciferol (VITAMIN D3) 2000 units TABS Take 2,000 mg by mouth daily.      febuxostat (ULORIC) 40 MG tablet Take 40 mg by mouth daily.     loratadine (CLARITIN) 10 MG tablet Take 10 mg by mouth 2 (two) times daily.      losartan-hydrochlorothiazide (HYZAAR) 100-12.5 MG per tablet Take 1 tablet by mouth daily.     melatonin 5 MG TABS Take 1 tablet (5 mg total) by mouth at bedtime as needed.  0   pantoprazole (PROTONIX) 40 MG tablet Take 40 mg by mouth daily.     PROAIR HFA 108 (90 BASE) MCG/ACT inhaler Inhale 2 puffs into the lungs every 6 (six) hours as needed (for respiratory issues.).      triamcinolone (NASACORT) 55 MCG/ACT AERO nasal inhaler Place 2 sprays into the nose daily as needed (for allergies.).     No current facility-administered medications for this encounter.    REVIEW OF SYSTEMS: As above in HPI.   PHYSICAL EXAM:  vitals were not taken for this visit.   General: Alert and oriented, in no acute distress HEENT: Head is normocephalic. Extraocular movements are intact. Oropharynx is clear. Neck: Neck is supple, no palpable cervical or supraclavicular lymphadenopathy. Heart: Regular in rate and rhythm with no murmurs, rubs, or gallops. Chest: Clear to auscultation bilaterally, with no rhonchi, wheezes, or rales. Abdomen: Soft, nontender, nondistended, with no rigidity or guarding. Extremities: No cyanosis or edema. Lymphatics: see Neck Exam Skin: No concerning lesions. Musculoskeletal: symmetric strength and muscle tone throughout. Neurologic: Cranial nerves II through XII are grossly intact. No obvious focalities. Speech is fluent. Coordination is intact. Psychiatric: Judgment and insight are intact. Affect is appropriate. Breasts: *** . No other palpable masses appreciated in the breasts or axillae *** .    ECOG = ***  0 - Asymptomatic (Fully active, able to carry on all predisease activities without restriction)  1 - Symptomatic but completely ambulatory (Restricted in physically strenuous activity but ambulatory and able to carry out work  of a light or sedentary nature. For example, light housework, office work)  2 - Symptomatic, <50% in bed during the day (Ambulatory and capable of all self care but unable to carry out any work activities. Up and about more than 50% of waking hours)  3 - Symptomatic, >50% in bed, but not bedbound (Capable of only limited self-care, confined to bed or chair 50% or more of waking hours)  4 - Bedbound (Completely disabled. Cannot carry on any self-care. Totally confined to bed or chair)  5 - Death   Eustace Pen MM, Creech RH, Tormey DC, et al. [phone]). "Toxicity and response criteria of the [facility]". [place] Oncol. 5 (6): 649-55   LABORATORY DATA:  No results found for: WBC, HGB, HCT, MCV, PLT CMP  No results found for: NA, K, CL, CO2, GLUCOSE, BUN, CREATININE, CALCIUM, PROT, ALBUMIN, AST, ALT, ALKPHOS, BILITOT, GFRNONAA, GFRAA       RADIOGRAPHY: MM CLIP PLACEMENT LEFT  Result Date: 08/02/2021 CLINICAL DATA:  Status post left breast ultrasound-guided biopsy. EXAM: 3D DIAGNOSTIC LEFT MAMMOGRAM POST ULTRASOUND BIOPSY COMPARISON:  Previous exam(s). FINDINGS: 3D Mammographic images were obtained following ultrasound guided biopsy of the left breast. The biopsy marking clip is in  expected position at the site of biopsy. IMPRESSION: Appropriate positioning of the ribbon shaped biopsy marking clip at the site of biopsy in the upper-outer left breast. Final Assessment: Post Procedure Mammograms for Marker Placement Electronically Signed   By: Serena  Chacko M.D.   On: 08/02/2021 14:54  US LT BREAST BX W LOC DEV 1ST LESION IMG BX SPEC US GUIDE  Addendum Date: 08/25/2021   ADDENDUM REPORT: 08/25/2021 08:01 ADDENDUM: Pathology revealed GRADE II INVASIVE MAMMARY CARCINOMA of the LEFT breast, 2:00 o'clock, 9 cmfn, (ribbon clip). Immunohistochemical stain for E-cadherin is negative in the tumor cells, consistent with a lobular phenotype. This was found to be concordant by Dr. Serena  Chacko. Pathology results were discussed with the patient by telephone by Susan Eaton, Rn Nurse Navigator. The patient reported doing well after the biopsy with tenderness at the site. Post biopsy instructions and care were reviewed and questions were answered. The patient was encouraged to call [facility] for any additional concerns. Surgical consultation has been arranged with Dr. Matthew Wakefield at [facility] on Aug 19, 2021. Recommendation for a bilateral breast MRI given the lobular histology. Pathology results reported by Lynne Bailey, RN on 08/05/2021. Electronically Signed   By: Serena  Chacko M.D.   On: 08/25/2021 08:01   Result Date: 08/25/2021 CLINICAL DATA:  75-year-old female with an indeterminate left breast mass. EXAM: ULTRASOUND GUIDED LEFT BREAST CORE NEEDLE BIOPSY COMPARISON:  Previous exam(s). PROCEDURE: I met with the patient and we discussed the procedure of ultrasound-guided biopsy, including benefits and alternatives. We discussed the high likelihood of a successful procedure. We discussed the risks of the procedure, including infection, bleeding, tissue injury, clip migration, and inadequate sampling. Informed written consent was given. The usual time-out protocol was performed immediately prior to the procedure. Lesion quadrant: Upper outer quadrant Using sterile technique and 1% Lidocaine as local anesthetic, under direct ultrasound visualization, a 14 gauge spring-loaded device was used to perform biopsy of a mass at the 2 o'clock position 9 cm from the nipple using a lateral approach. At the conclusion of the procedure a ribbon shaped tissue marker clip was deployed into the biopsy cavity. Follow up 2 view mammogram was performed and dictated separately. IMPRESSION: Ultrasound guided biopsy of the left breast. No apparent complications. Electronically Signed: By: Serena  Chacko M.D. On: 08/02/2021 14:43     IMPRESSION/PLAN: ***   It was a  pleasure meeting the patient today. We discussed the risks, benefits, and side effects of radiotherapy. I recommend radiotherapy to the *** to reduce her risk of locoregional recurrence by 2/3.  We discussed that radiation would take approximately *** weeks to complete and that I would give the patient a few weeks to heal following surgery before starting treatment planning. *** If chemotherapy were to be given, this would precede radiotherapy. We spoke about acute effects including skin irritation and fatigue as well as much less common late effects including internal organ injury or irritation. We spoke about the latest technology that is used to minimize the risk of late effects for patients undergoing radiotherapy to the breast or chest wall. No guarantees of treatment were given. The patient is enthusiastic about proceeding with treatment. I look forward to participating in the patient's care.  I will await her referral back to me for postoperative follow-up and eventual CT simulation/treatment planning.  On date of service, in total, I spent *** minutes on this encounter. Patient was seen in person.   __________________________________________     Sarah Squire, MD  This document serves as a record of services personally performed by Sarah Squire, MD. It was created on her behalf by Elisa Frazier, a trained medical scribe. The creation of this record is based on the scribe's personal observations and the provider's statements to them. This document has been checked and approved by the attending provider.  

## 2021-08-30 ENCOUNTER — Ambulatory Visit
Admission: RE | Admit: 2021-08-30 | Discharge: 2021-08-30 | Disposition: A | Discharge status: Home / Self Care | Payer: Medicare Other | Source: Ambulatory Visit | Attending: Radiation Oncology | Admitting: Radiation Oncology

## 2021-08-30 ENCOUNTER — Encounter: Payer: Self-pay | Admitting: *Deleted

## 2021-08-30 ENCOUNTER — Encounter: Payer: Self-pay | Admitting: Radiation Oncology

## 2021-08-30 VITALS — BP 133/72 | HR 98 | Temp 98.6°F | Resp 20 | Ht 62.0 in | Wt 162.4 lb

## 2021-08-30 DIAGNOSIS — C50412 Malignant neoplasm of upper-outer quadrant of left female breast: Secondary | ICD-10-CM | POA: Diagnosis not present

## 2021-08-30 DIAGNOSIS — Z87891 Personal history of nicotine dependence: Secondary | ICD-10-CM | POA: Diagnosis not present

## 2021-08-30 DIAGNOSIS — K219 Gastro-esophageal reflux disease without esophagitis: Secondary | ICD-10-CM | POA: Diagnosis not present

## 2021-08-30 DIAGNOSIS — Z79899 Other long term (current) drug therapy: Secondary | ICD-10-CM | POA: Diagnosis not present

## 2021-08-30 DIAGNOSIS — I129 Hypertensive chronic kidney disease with stage 1 through stage 4 chronic kidney disease, or unspecified chronic kidney disease: Secondary | ICD-10-CM | POA: Diagnosis not present

## 2021-08-30 DIAGNOSIS — Z170 Estrogen receptor positive status [ER+]: Secondary | ICD-10-CM | POA: Diagnosis not present

## 2021-08-30 DIAGNOSIS — N189 Chronic kidney disease, unspecified: Secondary | ICD-10-CM | POA: Insufficient documentation

## 2021-08-30 DIAGNOSIS — Z800 Family history of malignant neoplasm of digestive organs: Secondary | ICD-10-CM | POA: Insufficient documentation

## 2021-08-31 ENCOUNTER — Encounter (HOSPITAL_BASED_OUTPATIENT_CLINIC_OR_DEPARTMENT_OTHER): Payer: Self-pay | Admitting: General Surgery

## 2021-08-31 ENCOUNTER — Other Ambulatory Visit: Payer: Self-pay

## 2021-09-02 ENCOUNTER — Encounter: Payer: Self-pay | Admitting: Licensed Clinical Social Worker

## 2021-09-02 NOTE — Progress Notes (Signed)
[facility] Clinical Social Work  Initial Assessment   Rachel Villarreal is a 76 y.o. year old female contacted by phone. Clinical Social Work was referred by  new pt protocol  for assessment of psychosocial needs.   SDOH (Social Determinants of Health) assessments performed: Yes SDOH Interventions    Flowsheet Row Most Recent Value  SDOH Interventions   Food Insecurity Interventions Intervention Not Indicated  Financial Strain Interventions Intervention Not Indicated  Housing Interventions Intervention Not Indicated  Stress Interventions Intervention Not Indicated  Transportation Interventions Intervention Not Indicated       SDOH Screenings   Alcohol Screen: Not on file  Depression (PHQ2-9): Low Risk    PHQ-2 Score: 0  Financial Resource Strain: Low Risk    Difficulty of Paying Living Expenses: Not hard at all  Food Insecurity: No Food Insecurity   Worried About Charity fundraiser in the Last Year: Never true   Arboriculturist in the Last Year: Never true  Housing: Low Risk    Last Housing Risk Score: 0  Physical Activity: Not on file  Social Connections: Not on file  Stress: No Stress Concern Present   Feeling of Stress : Not at all  Tobacco Use: Medium Risk   Smoking Tobacco Use: Former   Smokeless Tobacco Use: Never   Passive Exposure: Not on file  Transportation Needs: No Transportation Needs   Lack of Transportation (Medical): No   Lack of Transportation (Non-Medical): No     Distress Screen completed: No     View : No data to display.            Family/Social Information:  Housing Arrangement: patient lives alone with her cat Family members/support persons in your life? 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  Income source: Conservation officer, historic buildings and Development worker, international aid concerns: No Type of concern: None Food access concerns: no Religious or spiritual practice: Not known Services Currently  in place:  friend will help drive to surgery & she will stay with them after  Coping/ Adjustment to diagnosis: Patient understands treatment plan and what happens next? yes Concerns about diagnosis and/or treatment: I'm not especially worried about anything Patient reported stressors:  diagnosis Patient enjoys  travel Current coping skills/ strengths: Ability for insight , Capable of independent living , Communication skills , Motivation for treatment/growth , and Supportive family/friends     SUMMARY: Current SDOH Barriers:  None at this time  Clinical Social Work Clinical Goal(s):  No clinical social work goals at this time  Interventions: Discussed common feeling and emotions when being diagnosed with cancer, and the importance of support during treatment Informed patient of the support team roles and support services at [facility] Provided [place] contact information and encouraged patient to call with any questions or concerns   Follow Up Plan: Patient will contact CSW with any support or resource needs Patient verbalizes understanding of plan: Yes    Blayke Cordrey E Kalev Temme, LCSW

## 2021-09-07 ENCOUNTER — Encounter (HOSPITAL_BASED_OUTPATIENT_CLINIC_OR_DEPARTMENT_OTHER)
Admission: RE | Admit: 2021-09-07 | Discharge: 2021-09-07 | Disposition: A | Discharge status: Home / Self Care | Payer: Medicare Other | Source: Ambulatory Visit | Attending: General Surgery | Admitting: General Surgery

## 2021-09-07 DIAGNOSIS — Z01812 Encounter for preprocedural laboratory examination: Secondary | ICD-10-CM | POA: Diagnosis not present

## 2021-09-07 DIAGNOSIS — I10 Essential (primary) hypertension: Secondary | ICD-10-CM | POA: Insufficient documentation

## 2021-09-07 LAB — BASIC METABOLIC PANEL
Anion gap: 8 (ref 5–15)
BUN: 57 mg/dL — ABNORMAL HIGH (ref 8–23)
CO2: 18 mmol/L — ABNORMAL LOW (ref 22–32)
Calcium: 9.5 mg/dL (ref 8.9–10.3)
Chloride: 112 mmol/L — ABNORMAL HIGH (ref 98–111)
Creatinine, Ser: 3.72 mg/dL — ABNORMAL HIGH (ref 0.44–1.00)
GFR, Estimated: 12 mL/min — ABNORMAL LOW (ref >60)
Glucose, Bld: 100 mg/dL — ABNORMAL HIGH (ref 70–99)
Potassium: 4.3 mmol/L (ref 3.5–5.1)
Sodium: 138 mmol/L (ref 135–145)

## 2021-09-07 MED ORDER — ENSURE PRE-SURGERY PO LIQD: 296.0000 mL | Freq: Once | ORAL | Status: DC

## 2021-09-07 NOTE — Progress Notes (Signed)
HEMATOLOGY-ONCOLOGY TELEPHONE VISIT PROGRESS NOTE  I connected with Rachel Villarreal on 09/21/21 at  9:30 AM EDT by telephone and verified that I am speaking with the correct person using two identifiers.  I discussed the limitations, risks, security and privacy concerns of performing an evaluation and management service by telephone and the availability of in person appointments.  I also discussed with the patient that there may be a patient responsible charge related to this service. The patient expressed understanding and agreed to proceed.   History of Present Illness: Rachel Villarreal 76 y.o. female is here because of recent diagnosis of left breast cancer. She presents to the clinic today for via telephone follow-up. She's recovering from surgery very well.  Oncology History  Malignant neoplasm of upper-outer quadrant of left breast in female, estrogen receptor positive ([place])  08/02/2021 Initial Diagnosis   Screening mammogram detected left breast distortion measuring 1.1 cm by ultrasound, negative axillary lymph nodes.  Biopsy revealed grade 2 ILC ER 100%, PR 95%, Ki-67 5%, HER2 IHC 2+, negative ratio 1.29   08/29/2021 Cancer Staging   Staging form: Breast, AJCC 8th Edition - Clinical stage from 08/29/2021: Stage IA (cT1c, cN0, cM0, G2, ER+, PR+, HER2-) - Signed by Nicholas Lose, MD on 08/29/2021 Stage prefix: Initial diagnosis Histologic grading system: 3 grade system     REVIEW OF SYSTEMS:   Constitutional: Denies fevers, chills or abnormal weight loss All other systems were reviewed with the patient and are negative. Observations/Objective:     Assessment Plan:  Malignant neoplasm of upper-outer quadrant of left breast in female, estrogen receptor positive (HCC) Left Lumpectomy: 1.1 cm Grade 1 ILC , Positive Posterior margin, ER 100%, PR 95%, Ki-67 5%, HER2 IHC 2+, negative ratio 1.29  She is a retired Software engineer and loves to travel.  Pathology counseling: I discussed the final pathology  report of the patient provided  a copy of this report. I discussed the margins as well as lymph node surgeries. We also discussed the final staging along with previously performed ER/PR and HER-2/neu testing.  Plan: 1. Margin excision 2. Adjuvant radiation therapy followed by 3. Adjuvant antiestrogen therapy with Letrozole She took of her estrogen patch off, hot flashes came back (she was using it for incontinence)  RTC after XRT is complete    I discussed the assessment and treatment plan with the patient. The patient was provided an opportunity to ask questions and all were answered. The patient agreed with the plan and demonstrated an understanding of the instructions. The patient was advised to call back or seek an in-person evaluation if the symptoms worsen or if the condition fails to improve as anticipated.   I provided 12 minutes of non-face-to-face time during this encounter. Harriette Ohara, MD   I Gardiner Coins am scribing for Dr. Lindi Adie  I have reviewed the above documentation for accuracy and completeness, and I agree with the above.

## 2021-09-07 NOTE — Progress Notes (Signed)

## 2021-09-08 NOTE — Progress Notes (Signed)
Labs reviewed by Dr. Smith Robert, will proceed with surgery as scheduled.

## 2021-09-09 ENCOUNTER — Ambulatory Visit
Admission: RE | Admit: 2021-09-09 | Discharge: 2021-09-09 | Disposition: A | Discharge status: Home / Self Care | Payer: Medicare Other | Source: Ambulatory Visit | Attending: General Surgery | Admitting: General Surgery

## 2021-09-09 DIAGNOSIS — C50412 Malignant neoplasm of upper-outer quadrant of left female breast: Secondary | ICD-10-CM

## 2021-09-09 DIAGNOSIS — C50912 Malignant neoplasm of unspecified site of left female breast: Secondary | ICD-10-CM | POA: Diagnosis not present

## 2021-09-11 NOTE — Anesthesia Preprocedure Evaluation (Signed)
Anesthesia Evaluation  Patient identified by MRN, date of birth, ID band Patient awake    Reviewed: Allergy & Precautions, NPO status , Patient's Chart, lab work & pertinent test results  History of Anesthesia Complications Negative for: history of anesthetic complications  Airway Mallampati: I  TM Distance: >3 FB Neck ROM: Full    Dental  (+) Dental Advisory Given, Teeth Intact   Pulmonary asthma , former smoker,    Pulmonary exam normal        Cardiovascular hypertension, Pt. on medications  Rhythm:Regular Rate:Tachycardia     Neuro/Psych negative neurological ROS  negative psych ROS   GI/Hepatic Neg liver ROS, GERD  Medicated and Controlled,  Endo/Other  negative endocrine ROS  Renal/GU CRFRenal disease  Female GU complaint     Musculoskeletal  Gout    Abdominal   Peds  Hematology negative hematology ROS (+)   Anesthesia Other Findings   Reproductive/Obstetrics  Breast cancer                             Anesthesia Physical Anesthesia Plan  ASA: 3  Anesthesia Plan: General   Post-op Pain Management: Tylenol PO (pre-op)*   Induction: Intravenous  PONV Risk Score and Plan: 3 and Treatment may vary due to age or medical condition, Ondansetron and Dexamethasone  Airway Management Planned: LMA  Additional Equipment: None  Intra-op Plan:   Post-operative Plan: Extubation in OR  Informed Consent: I have reviewed the patients History and Physical, chart, labs and discussed the procedure including the risks, benefits and alternatives for the proposed anesthesia with the patient or authorized representative who has indicated his/her understanding and acceptance.     Dental advisory given  Plan Discussed with: CRNA and Anesthesiologist  Anesthesia Plan Comments:        Anesthesia Quick Evaluation

## 2021-09-12 ENCOUNTER — Ambulatory Visit
Admission: RE | Admit: 2021-09-12 | Discharge: 2021-09-12 | Disposition: A | Discharge status: Home / Self Care | Payer: Medicare Other | Source: Ambulatory Visit | Attending: General Surgery | Admitting: General Surgery

## 2021-09-12 ENCOUNTER — Encounter (HOSPITAL_BASED_OUTPATIENT_CLINIC_OR_DEPARTMENT_OTHER): Payer: Self-pay | Admitting: General Surgery

## 2021-09-12 ENCOUNTER — Ambulatory Visit (HOSPITAL_BASED_OUTPATIENT_CLINIC_OR_DEPARTMENT_OTHER): Payer: Medicare Other | Admitting: Anesthesiology

## 2021-09-12 ENCOUNTER — Other Ambulatory Visit: Payer: Self-pay

## 2021-09-12 ENCOUNTER — Encounter (HOSPITAL_BASED_OUTPATIENT_CLINIC_OR_DEPARTMENT_OTHER)
Admission: RE | Disposition: A | Discharge status: Home / Self Care | Payer: Self-pay | Source: Ambulatory Visit | Attending: General Surgery

## 2021-09-12 ENCOUNTER — Observation Stay (HOSPITAL_BASED_OUTPATIENT_CLINIC_OR_DEPARTMENT_OTHER)
Admission: RE | Admit: 2021-09-12 | Discharge: 2021-09-13 | Disposition: A | Discharge status: Home / Self Care | Payer: Medicare Other | Source: Ambulatory Visit | Attending: General Surgery | Admitting: General Surgery

## 2021-09-12 DIAGNOSIS — I129 Hypertensive chronic kidney disease with stage 1 through stage 4 chronic kidney disease, or unspecified chronic kidney disease: Secondary | ICD-10-CM

## 2021-09-12 DIAGNOSIS — J45909 Unspecified asthma, uncomplicated: Secondary | ICD-10-CM

## 2021-09-12 DIAGNOSIS — Z79899 Other long term (current) drug therapy: Secondary | ICD-10-CM | POA: Insufficient documentation

## 2021-09-12 DIAGNOSIS — C50912 Malignant neoplasm of unspecified site of left female breast: Secondary | ICD-10-CM | POA: Diagnosis present

## 2021-09-12 DIAGNOSIS — C50412 Malignant neoplasm of upper-outer quadrant of left female breast: Secondary | ICD-10-CM | POA: Diagnosis not present

## 2021-09-12 DIAGNOSIS — R928 Other abnormal and inconclusive findings on diagnostic imaging of breast: Secondary | ICD-10-CM | POA: Diagnosis not present

## 2021-09-12 DIAGNOSIS — N189 Chronic kidney disease, unspecified: Secondary | ICD-10-CM | POA: Insufficient documentation

## 2021-09-12 DIAGNOSIS — Z87891 Personal history of nicotine dependence: Secondary | ICD-10-CM | POA: Insufficient documentation

## 2021-09-12 DIAGNOSIS — Z170 Estrogen receptor positive status [ER+]: Secondary | ICD-10-CM | POA: Insufficient documentation

## 2021-09-12 DIAGNOSIS — I10 Essential (primary) hypertension: Secondary | ICD-10-CM

## 2021-09-12 DIAGNOSIS — C50919 Malignant neoplasm of unspecified site of unspecified female breast: Secondary | ICD-10-CM | POA: Diagnosis present

## 2021-09-12 HISTORY — PX: BREAST LUMPECTOMY WITH RADIOACTIVE SEED LOCALIZATION: SHX6424

## 2021-09-12 HISTORY — DX: Malignant (primary) neoplasm, unspecified: C80.1

## 2021-09-12 SURGERY — BREAST LUMPECTOMY WITH RADIOACTIVE SEED LOCALIZATION
Anesthesia: General | Site: Breast | Laterality: Left

## 2021-09-12 MED ORDER — ONDANSETRON HCL 4 MG/2ML IJ SOLN
INTRAMUSCULAR | Status: DC | PRN
  Administered 2021-09-12: 4 mg via INTRAVENOUS

## 2021-09-12 MED ORDER — OXYCODONE HCL 5 MG PO TABS: 5.0000 mg | ORAL_TABLET | Freq: Once | ORAL | Status: DC | PRN

## 2021-09-12 MED ORDER — LIDOCAINE HCL (CARDIAC) PF 100 MG/5ML IV SOSY
PREFILLED_SYRINGE | INTRAVENOUS | Status: DC | PRN
  Administered 2021-09-12: 60 mg via INTRAVENOUS

## 2021-09-12 MED ORDER — ONDANSETRON HCL 4 MG/2ML IJ SOLN: 4.0000 mg | Freq: Four times a day (QID) | INTRAMUSCULAR | Status: DC | PRN

## 2021-09-12 MED ORDER — SODIUM CHLORIDE 0.9 % IV SOLN: INTRAVENOUS | Status: DC

## 2021-09-12 MED ORDER — PROPOFOL 10 MG/ML IV BOLUS
INTRAVENOUS | Status: AC
Start: 2021-09-12 — End: ?
  Filled 2021-09-12: qty 20

## 2021-09-12 MED ORDER — LOSARTAN POTASSIUM-HCTZ 100-12.5 MG PO TABS
1.0000 | ORAL_TABLET | Freq: Every day | ORAL | Status: DC
Start: 2021-09-13 — End: 2021-09-12

## 2021-09-12 MED ORDER — PROPOFOL 10 MG/ML IV BOLUS
INTRAVENOUS | Status: DC | PRN
  Administered 2021-09-12: 130 mg via INTRAVENOUS

## 2021-09-12 MED ORDER — ALBUTEROL SULFATE HFA 108 (90 BASE) MCG/ACT IN AERS
2.0000 | INHALATION_SPRAY | Freq: Four times a day (QID) | RESPIRATORY_TRACT | Status: DC | PRN
Start: 2021-09-12 — End: 2021-09-13

## 2021-09-12 MED ORDER — PANTOPRAZOLE SODIUM 40 MG PO TBEC: 40.0000 mg | DELAYED_RELEASE_TABLET | Freq: Every day | ORAL | Status: DC

## 2021-09-12 MED ORDER — LOSARTAN POTASSIUM 50 MG PO TABS
100.0000 mg | ORAL_TABLET | Freq: Every day | ORAL | Status: DC
  Administered 2021-09-12: 100 mg via ORAL
  Filled 2021-09-12: qty 2

## 2021-09-12 MED ORDER — OXYCODONE HCL 5 MG/5ML PO SOLN: 5.0000 mg | Freq: Once | ORAL | Status: DC | PRN

## 2021-09-12 MED ORDER — DEXAMETHASONE SODIUM PHOSPHATE 10 MG/ML IJ SOLN
INTRAMUSCULAR | Status: AC
Start: 2021-09-12 — End: ?
  Filled 2021-09-12: qty 1

## 2021-09-12 MED ORDER — CEFAZOLIN SODIUM-DEXTROSE 2-4 GM/100ML-% IV SOLN
INTRAVENOUS | Status: AC
  Filled 2021-09-12: qty 100

## 2021-09-12 MED ORDER — FLUTICASONE PROPIONATE 50 MCG/ACT NA SUSP: 1.0000 | Freq: Every day | NASAL | Status: DC

## 2021-09-12 MED ORDER — HYDROCHLOROTHIAZIDE 12.5 MG PO TABS
12.5000 mg | ORAL_TABLET | Freq: Every day | ORAL | Status: DC
  Administered 2021-09-12: 12.5 mg via ORAL
  Filled 2021-09-12: qty 1

## 2021-09-12 MED ORDER — CEFAZOLIN SODIUM-DEXTROSE 2-4 GM/100ML-% IV SOLN
2.0000 g | INTRAVENOUS | Status: AC
  Administered 2021-09-12: 2 g via INTRAVENOUS

## 2021-09-12 MED ORDER — MIDAZOLAM HCL 2 MG/2ML IJ SOLN
INTRAMUSCULAR | Status: AC
  Filled 2021-09-12: qty 2

## 2021-09-12 MED ORDER — ACETAMINOPHEN 500 MG PO TABS
1000.0000 mg | ORAL_TABLET | Freq: Four times a day (QID) | ORAL | Status: DC
  Administered 2021-09-12 (×2): 1000 mg via ORAL
  Filled 2021-09-12 (×3): qty 2

## 2021-09-12 MED ORDER — ONDANSETRON 4 MG PO TBDP: 4.0000 mg | ORAL_TABLET | Freq: Four times a day (QID) | ORAL | Status: DC | PRN

## 2021-09-12 MED ORDER — ACETAMINOPHEN 500 MG PO TABS
1000.0000 mg | ORAL_TABLET | Freq: Once | ORAL | Status: AC
  Administered 2021-09-12: 1000 mg via ORAL

## 2021-09-12 MED ORDER — PHENYLEPHRINE HCL (PRESSORS) 10 MG/ML IV SOLN
INTRAVENOUS | Status: DC | PRN
  Administered 2021-09-12 (×2): 80 ug via INTRAVENOUS

## 2021-09-12 MED ORDER — ACETAMINOPHEN 500 MG PO TABS: 1000.0000 mg | ORAL_TABLET | ORAL | Status: DC

## 2021-09-12 MED ORDER — MIDAZOLAM HCL 5 MG/5ML IJ SOLN
INTRAMUSCULAR | Status: DC | PRN
  Administered 2021-09-12: .5 mg via INTRAVENOUS

## 2021-09-12 MED ORDER — CHLORHEXIDINE GLUCONATE CLOTH 2 % EX PADS: 6.0000 | MEDICATED_PAD | Freq: Once | CUTANEOUS | Status: DC

## 2021-09-12 MED ORDER — MELATONIN 5 MG PO TABS
5.0000 mg | ORAL_TABLET | Freq: Every evening | ORAL | Status: DC | PRN
  Filled 2021-09-12: qty 1

## 2021-09-12 MED ORDER — FENTANYL CITRATE (PF) 100 MCG/2ML IJ SOLN
25.0000 ug | INTRAMUSCULAR | Status: DC | PRN
  Administered 2021-09-12: 50 ug via INTRAVENOUS

## 2021-09-12 MED ORDER — ONDANSETRON HCL 4 MG/2ML IJ SOLN: 4.0000 mg | Freq: Once | INTRAMUSCULAR | Status: DC | PRN

## 2021-09-12 MED ORDER — DEXAMETHASONE SODIUM PHOSPHATE 4 MG/ML IJ SOLN
INTRAMUSCULAR | Status: DC | PRN
  Administered 2021-09-12: 5 mg via INTRAVENOUS

## 2021-09-12 MED ORDER — AMLODIPINE BESYLATE 2.5 MG PO TABS: 7.5000 mg | ORAL_TABLET | Freq: Every day | ORAL | Status: DC

## 2021-09-12 MED ORDER — FENTANYL CITRATE (PF) 100 MCG/2ML IJ SOLN
INTRAMUSCULAR | Status: AC
  Filled 2021-09-12: qty 2

## 2021-09-12 MED ORDER — PHENYLEPHRINE 80 MCG/ML (10ML) SYRINGE FOR IV PUSH (FOR BLOOD PRESSURE SUPPORT)
PREFILLED_SYRINGE | INTRAVENOUS | Status: AC
  Filled 2021-09-12: qty 10

## 2021-09-12 MED ORDER — FENTANYL CITRATE (PF) 100 MCG/2ML IJ SOLN
INTRAMUSCULAR | Status: DC | PRN
Start: 2021-09-12 — End: 2021-09-12
  Administered 2021-09-12: 25 ug via INTRAVENOUS

## 2021-09-12 MED ORDER — TRIAMCINOLONE ACETONIDE 55 MCG/ACT NA AERO: 2.0000 | INHALATION_SPRAY | Freq: Every day | NASAL | Status: DC | PRN

## 2021-09-12 MED ORDER — ACETAMINOPHEN 500 MG PO TABS
ORAL_TABLET | ORAL | Status: AC
  Filled 2021-09-12: qty 2

## 2021-09-12 MED ORDER — LIDOCAINE 2% (20 MG/ML) 5 ML SYRINGE
INTRAMUSCULAR | Status: AC
Start: 2021-09-12 — End: ?
  Filled 2021-09-12: qty 5

## 2021-09-12 MED ORDER — ONDANSETRON HCL 4 MG/2ML IJ SOLN
INTRAMUSCULAR | Status: AC
  Filled 2021-09-12: qty 2

## 2021-09-12 MED ORDER — LORATADINE 10 MG PO TABS: 10.0000 mg | ORAL_TABLET | Freq: Two times a day (BID) | ORAL | Status: DC

## 2021-09-12 MED ORDER — 0.9 % SODIUM CHLORIDE (POUR BTL) OPTIME
TOPICAL | Status: DC | PRN
  Administered 2021-09-12: 60 mL

## 2021-09-12 MED ORDER — EPHEDRINE 5 MG/ML INJ
INTRAVENOUS | Status: AC
  Filled 2021-09-12: qty 5

## 2021-09-12 MED ORDER — LACTATED RINGERS IV SOLN: INTRAVENOUS | Status: DC

## 2021-09-12 MED ORDER — BUPIVACAINE HCL (PF) 0.25 % IJ SOLN
INTRAMUSCULAR | Status: DC | PRN
  Administered 2021-09-12: 8 mL

## 2021-09-12 SURGICAL SUPPLY — 57 items
ADH SKN CLS APL DERMABOND .7 (GAUZE/BANDAGES/DRESSINGS) ×1
APL PRP STRL LF DISP 70% ISPRP (MISCELLANEOUS) ×1
APPLIER CLIP 9.375 MED OPEN (MISCELLANEOUS) ×2
APR CLP MED 9.3 20 MLT OPN (MISCELLANEOUS) ×1
BINDER BREAST LRG (GAUZE/BANDAGES/DRESSINGS) IMPLANT
BINDER BREAST MEDIUM (GAUZE/BANDAGES/DRESSINGS) IMPLANT
BINDER BREAST XLRG (GAUZE/BANDAGES/DRESSINGS) IMPLANT
BINDER BREAST XXLRG (GAUZE/BANDAGES/DRESSINGS) IMPLANT
BLADE SURG 15 STRL LF DISP TIS (BLADE) ×1 IMPLANT
BLADE SURG 15 STRL SS (BLADE) ×2
CANISTER SUC SOCK COL 7IN (MISCELLANEOUS) IMPLANT
CANISTER SUCT 1200ML W/VALVE (MISCELLANEOUS) IMPLANT
CHLORAPREP W/TINT 26 (MISCELLANEOUS) ×2 IMPLANT
CLIP APPLIE 9.375 MED OPEN (MISCELLANEOUS) IMPLANT
CLIP TI WIDE RED SMALL 6 (CLIP) IMPLANT
COVER BACK TABLE 60X90IN (DRAPES) ×2 IMPLANT
COVER MAYO STAND STRL (DRAPES) ×2 IMPLANT
COVER PROBE W GEL 5X96 (DRAPES) ×2 IMPLANT
DERMABOND ADVANCED (GAUZE/BANDAGES/DRESSINGS) ×1
DERMABOND ADVANCED .7 DNX12 (GAUZE/BANDAGES/DRESSINGS) ×1 IMPLANT
DRAPE LAPAROSCOPIC ABDOMINAL (DRAPES) ×2 IMPLANT
DRAPE UTILITY XL STRL (DRAPES) ×2 IMPLANT
DRSG TEGADERM 4X4.75 (GAUZE/BANDAGES/DRESSINGS) IMPLANT
ELECT COATED BLADE 2.86 ST (ELECTRODE) ×2 IMPLANT
ELECT REM PT RETURN 9FT ADLT (ELECTROSURGICAL) ×2
ELECTRODE REM PT RTRN 9FT ADLT (ELECTROSURGICAL) ×1 IMPLANT
GAUZE SPONGE 4X4 12PLY STRL LF (GAUZE/BANDAGES/DRESSINGS) IMPLANT
GLOVE BIO SURGEON STRL SZ7 (GLOVE) ×4 IMPLANT
GLOVE BIOGEL PI IND STRL 7.5 (GLOVE) ×1 IMPLANT
GLOVE BIOGEL PI INDICATOR 7.5 (GLOVE) ×1
GOWN STRL REUS W/ TWL LRG LVL3 (GOWN DISPOSABLE) ×2 IMPLANT
GOWN STRL REUS W/TWL LRG LVL3 (GOWN DISPOSABLE) ×4
HEMOSTAT ARISTA ABSORB 3G PWDR (HEMOSTASIS) IMPLANT
KIT MARKER MARGIN INK (KITS) ×2 IMPLANT
NDL HYPO 25X1 1.5 SAFETY (NEEDLE) ×1 IMPLANT
NEEDLE HYPO 25X1 1.5 SAFETY (NEEDLE) ×2 IMPLANT
NS IRRIG 1000ML POUR BTL (IV SOLUTION) IMPLANT
PACK BASIN DAY SURGERY FS (CUSTOM PROCEDURE TRAY) ×2 IMPLANT
PENCIL SMOKE EVACUATOR (MISCELLANEOUS) ×2 IMPLANT
RETRACTOR ONETRAX LX 90X20 (MISCELLANEOUS) IMPLANT
SLEEVE SCD COMPRESS KNEE MED (STOCKING) ×2 IMPLANT
SPIKE FLUID TRANSFER (MISCELLANEOUS) IMPLANT
SPONGE T-LAP 4X18 ~~LOC~~+RFID (SPONGE) ×2 IMPLANT
STRIP CLOSURE SKIN 1/2X4 (GAUZE/BANDAGES/DRESSINGS) ×2 IMPLANT
SUT MNCRL AB 4-0 PS2 18 (SUTURE) ×2 IMPLANT
SUT MON AB 5-0 PS2 18 (SUTURE) IMPLANT
SUT SILK 2 0 SH (SUTURE) ×1 IMPLANT
SUT VIC AB 2-0 SH 27 (SUTURE) ×4
SUT VIC AB 2-0 SH 27XBRD (SUTURE) ×1 IMPLANT
SUT VIC AB 3-0 SH 27 (SUTURE) ×2
SUT VIC AB 3-0 SH 27X BRD (SUTURE) ×1 IMPLANT
SUT VIC AB 5-0 PS2 18 (SUTURE) IMPLANT
SYR CONTROL 10ML LL (SYRINGE) ×2 IMPLANT
TOWEL GREEN STERILE FF (TOWEL DISPOSABLE) ×2 IMPLANT
TRAY FAXITRON CT DISP (TRAY / TRAY PROCEDURE) ×2 IMPLANT
TUBE CONNECTING 20X1/4 (TUBING) IMPLANT
YANKAUER SUCT BULB TIP NO VENT (SUCTIONS) IMPLANT

## 2021-09-12 NOTE — Anesthesia Procedure Notes (Signed)
Procedure Name: LMA Insertion Date/Time: 09/12/2021 9:22 AM Performed by: Bufford Spikes, CRNA Pre-anesthesia Checklist: Patient identified, Emergency Drugs available, Suction available and Patient being monitored Patient Re-evaluated:Patient Re-evaluated prior to induction Oxygen Delivery Method: Circle system utilized Preoxygenation: Pre-oxygenation with 100% oxygen Induction Type: IV induction Ventilation: Mask ventilation without difficulty LMA: LMA inserted LMA Size: 4.0 Number of attempts: 1 Airway Equipment and Method: Bite block Placement Confirmation: positive ETCO2 Tube secured with: Tape Dental Injury: Teeth and Oropharynx as per pre-operative assessment

## 2021-09-12 NOTE — Op Note (Signed)
Preoperative diagnosis: Clinical stage 1 left breast cancer Postoperative diagnosis: Same as above Procedure: Left breast radioactive seed guided lumpectomy Surgeon: Dr. Serita Grammes Anesthesia: General  Estimated blood loss: Minimal Complications: None Drains: None Specimens: Left breast lumpectomy containing seed and clip marked with paint Additional superior/anterior margin marked short superior, long lateral double deep Sponge needle count was correct at completion Decision to recovery stable condition  Indications: 76 y.o. female who underwent a screening mammogram that shows C density breast. There is a left-sided distortion noted. On ultrasound there is a mass in the upper outer quadrant that measures 1.1 x 1 x 0.7 cm. Her axillary ultrasound is negative. Biopsy shows an invasive lobular carcinoma that is 100% ER positive, 95% PR positive, HER2 negative, and Ki-67 is 5%.we discussed options and elected to proceed with lumpectomy alone.   Procedure: She first had a seed placed by radiology.  I had these mammograms available for my review.  After informed consent was obtained she was taken to the OR. She was given antibiotics.  SCDs were placed.  She was then placed under general anesthesia without complication.  She was prepped and draped in the standard sterile surgical fashion.  A surgical timeout was then performed.  I infiltrated lidocaine over the seed. I made a curvlinear incision over the seed as I wanted to ensure the anterior margin was clear.  I then dissected to the seed. I removed the seed and the surrounding tissue with an attempt to get a clear margin. Mammogram confirmed removal of the seed and the clip.  I removed some additional superior margin as I thought this might be close on the 3D image. Hemostasis was observed. I closed this with 2-0 vicryl for the breast tissue. Hemostasis was observed.  I then closed skin with 3-0 vicryl and 4-0 monocryl. Glue was placed.   she  tolerated well, was extubated and transferred to recovery stable

## 2021-09-12 NOTE — Interval H&P Note (Signed)
History and Physical Interval Note:  09/12/2021 8:37 AM  Rachel Villarreal  has presented today for surgery, with the diagnosis of LEFT BREAST CANCER.  The various methods of treatment have been discussed with the patient and family. After consideration of risks, benefits and other options for treatment, the patient has consented to  Procedure(s): LEFT BREAST LUMPECTOMY WITH RADIOACTIVE SEED LOCALIZATION (Left) as a surgical intervention.  The patient's history has been reviewed, patient examined, no change in status, stable for surgery.  I have reviewed the patient's chart and labs.  Questions were answered to the patient's satisfaction.     Rolm Bookbinder

## 2021-09-12 NOTE — H&P (Signed)
76 y.o. female who is seen today as an office consultation for evaluation of Breast Cancer She is here to discuss new diagnosis of breast cancer. She has no mass or discharge. She has a prior history of a benign right-sided biopsy. She has no family history. She underwent a screening mammogram that shows C density breast. There is a left-sided distortion noted. On ultrasound there is a mass in the upper outer quadrant that measures 1.1 x 1 x 0.7 cm. Her axillary ultrasound is negative. 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  Review of Systems: A complete review of systems was obtained from the patient. I have reviewed this information and discussed as appropriate with the patient. See HPI as well for other ROS.  Review of Systems  HENT: Positive for congestion.  Gastrointestinal: Positive for heartburn.  All other systems reviewed and are negative.   Medical History: Past Medical History:  Diagnosis Date   Asthma, unspecified asthma severity, unspecified whether complicated, unspecified whether persistent   Chronic kidney disease   GERD (gastroesophageal reflux disease)   Glaucoma (increased eye pressure)   Hyperlipidemia   Hypertension    Past Surgical History:  Procedure Laterality Date   APPENDECTOMY   HYSTERECTOMY   laser of cervix    Allergies  Allergen Reactions   Ace Inhibitors Other (See Comments), Swelling and Unknown  unsure unsure   Mercury (Bulk) Unknown   Tapentadol Unknown  plastic   Current Outpatient Medications on File Prior to Visit  Medication Sig Dispense Refill   amLODIPine (NORVASC) 5 MG tablet Take by mouth   atorvastatin (LIPITOR) 10 MG tablet Take 10 mg by mouth once daily   cholecalciferol (VITAMIN D3) 2,000 unit tablet Take by mouth   coenzyme Q10 (CO Q-10) 10 mg capsule   Febuxostat (ULORIC) 40 mg tablet Take 40 mg by mouth once daily   hydroCHLOROthiazide  (HYDRODIURIL) 12.5 MG tablet   loratadine (CLARITIN) 10 mg tablet Take 10 mg by mouth 2 (two) times daily   losartan (COZAAR) 100 MG tablet   pantoprazole (PROTONIX) 40 MG DR tablet Take 40 mg by mouth once daily    Family History  Problem Relation Age of Onset   Hyperlipidemia (Elevated cholesterol) Mother   Coronary Artery Disease (Blocked arteries around heart) Mother   Stroke Father   Obesity Father   High blood pressure (Hypertension) Father   Colon cancer Father    Social History   Tobacco Use  Smoking Status Former   Types: Cigarettes   Quit date: 1974   Years since quitting: 49.3  Smokeless Tobacco Not on file    Social History   Socioeconomic History   Marital status: Divorced  Tobacco Use   Smoking status: Former  Types: Cigarettes  Quit date: 1974  Years since quitting: 49.3  Vaping Use   Vaping Use: Never used  Substance and Sexual Activity   Alcohol use: Never   Drug use: Never   Objective:   Vitals:  08/19/21 1013  BP: (!) 170/80  Pulse: 105  Temp: 36.9 C (98.4 F)  SpO2: 98%  Weight: 74.4 kg (164 lb)  Height: 154.9 cm ('5\' 1"' )   Body mass index is 30.99 kg/m.  Physical Exam Constitutional:  Appearance: Normal appearance.  Chest:  Breasts: Right: No inverted nipple, mass or nipple discharge.  Left: No inverted nipple, mass or nipple discharge.  Lymphadenopathy:  Upper Body:  Right upper body:  No supraclavicular or axillary adenopathy.  Left upper body: No axillary adenopathy.  Neurological:  Mental Status: She is alert.     Assessment and Plan:   Malignant neoplasm of upper-outer quadrant of left breast in female, estrogen receptor positive (CMS-HCC)  Left breast seed guided lumpectomy  We discussed the staging and pathophysiology of breast cancer. We discussed all of the different options for treatment for breast cancer including surgery, chemotherapy, radiation therapy, Herceptin, and antiestrogen therapy. We discussed  omitting sn biopsy based on choosing wisely and SOUND trial data. She is agreeable to this.  We discussed the options for treatment of the breast cancer which included lumpectomy versus a mastectomy. We discussed the performance of the lumpectomy with radioactive seed placement. We discussed a 5-10% chance of a positive margin requiring reexcision in the operating room. We also discussed that she will likely need radiation therapy if she undergoes lumpectomy. We discussed mastectomy and the postoperative care for that as well. Mastectomy can be followed by reconstruction. The decision for lumpectomy vs mastectomy has no impact on decision for chemotherapy. Most mastectomy patients will not need radiation therapy. We discussed that there is no difference in her survival whether she undergoes lumpectomy with radiation therapy or antiestrogen therapy versus a mastectomy. There is also no real difference between her recurrence in the breast. We discussed the risks of operation including bleeding, infection, possible reoperation. She understands her further therapy will be based on what her stages at the time of her operation.

## 2021-09-12 NOTE — Anesthesia Postprocedure Evaluation (Signed)
Anesthesia Post Note  Patient: Rachel Villarreal  Procedure(s) Performed: LEFT BREAST LUMPECTOMY WITH RADIOACTIVE SEED LOCALIZATION (Left: Breast)     Patient location during evaluation: PACU Anesthesia Type: General Level of consciousness: awake and alert Pain management: pain level controlled Vital Signs Assessment: post-procedure vital signs reviewed and stable Respiratory status: spontaneous breathing, nonlabored ventilation and respiratory function stable Cardiovascular status: stable and blood pressure returned to baseline Anesthetic complications: no   No notable events documented.  Last Vitals:  Vitals:   09/12/21 1030 09/12/21 1100  BP: 122/63 137/63  Pulse: 72 60  Resp: 18 16  Temp:  36.6 C  SpO2: 94% 96%    Last Pain:  Vitals:   09/12/21 1100  TempSrc:   PainSc: 0-No pain                 Audry Pili

## 2021-09-12 NOTE — Discharge Instructions (Addendum)
[place] Office Phone Number [phone]  POST OP INSTRUCTIONS Take 400 mg of ibuprofen every 8 hours or 650 mg tylenol every 6 hours for next 72 hours then as needed. Use ice several times daily also.   A prescription for pain medication may be given to you upon discharge.  Take your pain medication as prescribed, if needed.  If narcotic pain medicine is not needed, then you may take acetaminophen (Tylenol), naprosyn (Alleve) or ibuprofen (Advil) as needed. Take your usually prescribed medications unless otherwise directed If you need a refill on your pain medication, please contact your pharmacy.  They will contact our office to request authorization.  Prescriptions will not be filled after 5pm or on week-ends. You should eat very light the first 24 hours after surgery, such as soup, crackers, pudding, etc.  Resume your normal diet the day after surgery. Most patients will experience some swelling and bruising in the breast.  Ice packs and a good support bra will help.  Wear the breast binder provided or a sports bra for 72 hours day and night.  After that wear a sports bra during the day until you return to the office. Swelling and bruising can take several days to resolve.  It is common to experience some constipation if taking pain medication after surgery.  Increasing fluid intake and taking a stool softener will usually help or prevent this problem from occurring.  A mild laxative (Milk of Magnesia or Miralax) should be taken according to package directions if there are no bowel movements after 48 hours. I used skin glue on the incision, you may shower in 24 hours.  The glue will flake off over the next 2-3 weeks.  Any sutures or staples will be removed at the office during your follow-up visit. ACTIVITIES:  You may resume regular daily activities (gradually increasing) beginning the next day.  Wearing a good support bra or sports bra minimizes pain and swelling.  You may  drive when you no longer are taking prescription pain medication, you can comfortably wear a seatbelt, and you can safely maneuver your car and apply brakes. RETURN TO WORK:  ______________________________________________________________________________________ Dennis Bast should see your doctor in the office for a follow-up appointment approximately two weeks after your surgery.  Your doctor's nurse will typically make your follow-up appointment when she calls you with your pathology report.  Expect your pathology report 3-4 business days after your surgery.  You may call to check if you do not hear from [place] after three days. OTHER INSTRUCTIONS: _______________________________________________________________________________________________ _____________________________________________________________________________________________________________________________________ _____________________________________________________________________________________________________________________________________ _____________________________________________________________________________________________________________________________________  WHEN TO CALL DR Otie Headlee: Fever over 101.0 Nausea and/or vomiting. Extreme swelling or bruising. Continued bleeding from incision. Increased pain, redness, or drainage from the incision.  The clinic staff is available to answer your questions during regular business hours.  Please don't hesitate to call and ask to speak to one of the nurses for clinical concerns.  If you have a medical emergency, go to the nearest emergency room or call 911.  A surgeon from [facility] is always on call at the hospital.  For further questions, please visit [email or web address]   Post Anesthesia Home Care Instructions  Activity: Get plenty of rest for the remainder of the day. A responsible individual must stay with you for 24 hours following the procedure.   For the next 24 hours, DO NOT: -Drive a car -Paediatric nurse -Drink alcoholic beverages -Take any medication unless instructed by your physician -Make any legal decisions or  sign important papers.  Meals: Start with liquid foods such as gelatin or soup. Progress to regular foods as tolerated. Avoid greasy, spicy, heavy foods. If nausea and/or vomiting occur, drink only clear liquids until the nausea and/or vomiting subsides. Call your physician if vomiting continues.  Special Instructions/Symptoms: Your throat may feel dry or sore from the anesthesia or the breathing tube placed in your throat during surgery. If this causes discomfort, gargle with warm salt water. The discomfort should disappear within 24 hours.  If you had a scopolamine patch placed behind your ear for the management of post- operative nausea and/or vomiting:  1. The medication in the patch is effective for 72 hours, after which it should be removed.  Wrap patch in a tissue and discard in the trash. Wash hands thoroughly with soap and water. 2. You may remove the patch earlier than 72 hours if you experience unpleasant side effects which may include dry mouth, dizziness or visual disturbances. 3. Avoid touching the patch. Wash your hands with soap and water after contact with the patch.

## 2021-09-12 NOTE — Transfer of Care (Signed)
Immediate Anesthesia Transfer of Care Note  Patient: Rachel Villarreal  Procedure(s) Performed: LEFT BREAST LUMPECTOMY WITH RADIOACTIVE SEED LOCALIZATION (Left: Breast)  Patient Location: PACU  Anesthesia Type:GEN  Level of Consciousness: awake, alert  and oriented  Airway & Oxygen Therapy: Patient Spontanous Breathing and Patient connected to nasal cannula oxygen  Post-op Assessment: Report given to RN and Post -op Vital signs reviewed and stable  Post vital signs: Reviewed and stable  Last Vitals:  Vitals Value Taken Time  BP 128/65 09/12/21 1010  Temp    Pulse 76 09/12/21 1012  Resp 14 09/12/21 1012  SpO2 98 % 09/12/21 1012  Vitals shown include unvalidated device data.  Last Pain:  Vitals:   09/12/21 0720  TempSrc: Oral  PainSc: 0-No pain         Complications: No notable events documented.

## 2021-09-13 ENCOUNTER — Encounter (HOSPITAL_BASED_OUTPATIENT_CLINIC_OR_DEPARTMENT_OTHER): Payer: Self-pay | Admitting: General Surgery

## 2021-09-13 DIAGNOSIS — Z79899 Other long term (current) drug therapy: Secondary | ICD-10-CM | POA: Diagnosis not present

## 2021-09-13 DIAGNOSIS — Z87891 Personal history of nicotine dependence: Secondary | ICD-10-CM | POA: Diagnosis not present

## 2021-09-13 DIAGNOSIS — Z170 Estrogen receptor positive status [ER+]: Secondary | ICD-10-CM | POA: Diagnosis not present

## 2021-09-13 DIAGNOSIS — C50412 Malignant neoplasm of upper-outer quadrant of left female breast: Secondary | ICD-10-CM | POA: Diagnosis not present

## 2021-09-13 DIAGNOSIS — N189 Chronic kidney disease, unspecified: Secondary | ICD-10-CM | POA: Diagnosis not present

## 2021-09-13 DIAGNOSIS — I129 Hypertensive chronic kidney disease with stage 1 through stage 4 chronic kidney disease, or unspecified chronic kidney disease: Secondary | ICD-10-CM | POA: Diagnosis not present

## 2021-09-13 DIAGNOSIS — J45909 Unspecified asthma, uncomplicated: Secondary | ICD-10-CM | POA: Diagnosis not present

## 2021-09-13 MED ORDER — TRAMADOL HCL 50 MG PO TABS: 50.0000 mg | ORAL_TABLET | Freq: Four times a day (QID) | ORAL | Status: DC | PRN

## 2021-09-13 NOTE — Discharge Summary (Signed)
Physician Discharge Summary  Patient ID: Rachel Villarreal MRN: [id number] DOB/AGE: 78-Sep-1947 78 y.o.  Admit date: 09/12/2021 Discharge date: 09/13/2021  Admission Diagnoses: Breast cancer CKD GERD HTN  Discharge Diagnoses:  Principal Problem:   Breast cancer, left breast [facility])   Discharged Condition: good  Hospital Course: 59 yof s/p left breast lumpectomy. Stayed overnight due to no one at home. Doing fine next am  Consults: None  Significant Diagnostic Studies: none  Treatments: surgery: left breast seed guided lumpectomy  Discharge Exam: Blood pressure (!) 157/78, pulse 80, temperature 98 F (36.7 C), resp. rate 16, height '5\' 2"'$  (1.575 m), weight 72.8 kg, SpO2 96 %.   Disposition: Discharge disposition: 01-Home or Self Care       Discharge Instructions     Discharge patient   Complete by: As directed    Can dc home if doing well dont need to wait for me   Discharge disposition: 01-Home or Self Care   Discharge patient date: 09/13/2021        Follow-up Information     Rolm Bookbinder, MD Follow up in 3 week(s).   Specialty: General Surgery Contact information: [address] [place] [address] [phone]                 Signed: Rolm Bookbinder 09/13/2021, 8:41 AM

## 2021-09-15 LAB — SURGICAL PATHOLOGY

## 2021-09-17 ENCOUNTER — Other Ambulatory Visit: Payer: Self-pay | Admitting: General Surgery

## 2021-09-19 ENCOUNTER — Encounter: Payer: Self-pay | Admitting: *Deleted

## 2021-09-21 ENCOUNTER — Inpatient Hospital Stay: Payer: Medicare Other | Attending: Hematology and Oncology | Admitting: Hematology and Oncology

## 2021-09-21 DIAGNOSIS — Z170 Estrogen receptor positive status [ER+]: Secondary | ICD-10-CM

## 2021-09-21 DIAGNOSIS — C50412 Malignant neoplasm of upper-outer quadrant of left female breast: Secondary | ICD-10-CM | POA: Diagnosis not present

## 2021-09-21 NOTE — Assessment & Plan Note (Signed)
Left Lumpectomy: 1.1 cm Grade 1 ILC , Positive Posterior margin, ER 100%, PR 95%, Ki-67 5%, HER2 IHC 2+, negative ratio 1.29  She is a retired Software engineer and loves to travel.  Pathology counseling: I discussed the final pathology report of the patient provided  a copy of this report. I discussed the margins as well as lymph node surgeries. We also discussed the final staging along with previously performed ER/PR and HER-2/neu testing.  Plan: 1. Margin excision 2. Adjuvant radiation therapy followed by 3. Adjuvant antiestrogen therapy  RTC after XRT is complete

## 2021-09-23 ENCOUNTER — Other Ambulatory Visit: Payer: Self-pay

## 2021-09-23 ENCOUNTER — Encounter (HOSPITAL_COMMUNITY): Payer: Self-pay | Admitting: General Surgery

## 2021-09-23 DIAGNOSIS — I10 Essential (primary) hypertension: Secondary | ICD-10-CM | POA: Diagnosis not present

## 2021-09-23 DIAGNOSIS — E7800 Pure hypercholesterolemia, unspecified: Secondary | ICD-10-CM | POA: Diagnosis not present

## 2021-09-23 DIAGNOSIS — M109 Gout, unspecified: Secondary | ICD-10-CM | POA: Diagnosis not present

## 2021-09-23 DIAGNOSIS — C50919 Malignant neoplasm of unspecified site of unspecified female breast: Secondary | ICD-10-CM | POA: Diagnosis not present

## 2021-09-23 DIAGNOSIS — K219 Gastro-esophageal reflux disease without esophagitis: Secondary | ICD-10-CM | POA: Diagnosis not present

## 2021-09-23 DIAGNOSIS — N184 Chronic kidney disease, stage 4 (severe): Secondary | ICD-10-CM | POA: Diagnosis not present

## 2021-09-26 ENCOUNTER — Encounter: Payer: Self-pay | Admitting: *Deleted

## 2021-09-26 DIAGNOSIS — Z170 Estrogen receptor positive status [ER+]: Secondary | ICD-10-CM

## 2021-09-29 ENCOUNTER — Ambulatory Visit (HOSPITAL_COMMUNITY)
Admission: RE | Admit: 2021-09-29 | Discharge: 2021-09-29 | Disposition: A | Discharge status: Home / Self Care | Payer: Medicare Other | Source: Ambulatory Visit | Attending: General Surgery | Admitting: General Surgery

## 2021-09-29 ENCOUNTER — Encounter (HOSPITAL_COMMUNITY)
Admission: RE | Disposition: A | Discharge status: Home / Self Care | Payer: Self-pay | Source: Ambulatory Visit | Attending: General Surgery

## 2021-09-29 ENCOUNTER — Ambulatory Visit (HOSPITAL_BASED_OUTPATIENT_CLINIC_OR_DEPARTMENT_OTHER): Payer: Medicare Other | Admitting: Certified Registered Nurse Anesthetist

## 2021-09-29 ENCOUNTER — Ambulatory Visit (HOSPITAL_COMMUNITY): Payer: Medicare Other | Admitting: Certified Registered Nurse Anesthetist

## 2021-09-29 ENCOUNTER — Other Ambulatory Visit: Payer: Self-pay

## 2021-09-29 ENCOUNTER — Encounter (HOSPITAL_COMMUNITY): Payer: Self-pay | Admitting: General Surgery

## 2021-09-29 DIAGNOSIS — Z87891 Personal history of nicotine dependence: Secondary | ICD-10-CM | POA: Insufficient documentation

## 2021-09-29 DIAGNOSIS — N6489 Other specified disorders of breast: Secondary | ICD-10-CM | POA: Diagnosis not present

## 2021-09-29 DIAGNOSIS — J45909 Unspecified asthma, uncomplicated: Secondary | ICD-10-CM | POA: Diagnosis not present

## 2021-09-29 DIAGNOSIS — Z170 Estrogen receptor positive status [ER+]: Secondary | ICD-10-CM

## 2021-09-29 DIAGNOSIS — C50412 Malignant neoplasm of upper-outer quadrant of left female breast: Secondary | ICD-10-CM | POA: Insufficient documentation

## 2021-09-29 DIAGNOSIS — N189 Chronic kidney disease, unspecified: Secondary | ICD-10-CM | POA: Diagnosis not present

## 2021-09-29 DIAGNOSIS — K219 Gastro-esophageal reflux disease without esophagitis: Secondary | ICD-10-CM | POA: Diagnosis not present

## 2021-09-29 DIAGNOSIS — I129 Hypertensive chronic kidney disease with stage 1 through stage 4 chronic kidney disease, or unspecified chronic kidney disease: Secondary | ICD-10-CM | POA: Insufficient documentation

## 2021-09-29 DIAGNOSIS — I10 Essential (primary) hypertension: Secondary | ICD-10-CM

## 2021-09-29 DIAGNOSIS — C50912 Malignant neoplasm of unspecified site of left female breast: Secondary | ICD-10-CM | POA: Diagnosis not present

## 2021-09-29 HISTORY — PX: RE-EXCISION OF BREAST LUMPECTOMY: SHX6048

## 2021-09-29 LAB — CBC
HCT: 31.5 % — ABNORMAL LOW (ref 36.0–46.0)
Hemoglobin: 10.4 g/dL — ABNORMAL LOW (ref 12.0–15.0)
MCH: 29.1 pg (ref 26.0–34.0)
MCHC: 33 g/dL (ref 30.0–36.0)
MCV: 88 fL (ref 80.0–100.0)
Platelets: 331 10*3/uL (ref 150–400)
RBC: 3.58 MIL/uL — ABNORMAL LOW (ref 3.87–5.11)
RDW: 13.1 % (ref 11.5–15.5)
WBC: 10 10*3/uL (ref 4.0–10.5)
nRBC: 0 % (ref 0.0–0.2)

## 2021-09-29 LAB — BASIC METABOLIC PANEL
Anion gap: 9 (ref 5–15)
BUN: 59 mg/dL — ABNORMAL HIGH (ref 8–23)
CO2: 19 mmol/L — ABNORMAL LOW (ref 22–32)
Calcium: 9.5 mg/dL (ref 8.9–10.3)
Chloride: 113 mmol/L — ABNORMAL HIGH (ref 98–111)
Creatinine, Ser: 3.38 mg/dL — ABNORMAL HIGH (ref 0.44–1.00)
GFR, Estimated: 13 mL/min — ABNORMAL LOW (ref >60)
Glucose, Bld: 96 mg/dL (ref 70–99)
Potassium: 4.3 mmol/L (ref 3.5–5.1)
Sodium: 141 mmol/L (ref 135–145)

## 2021-09-29 SURGERY — EXCISION, LESION, BREAST
Anesthesia: General | Site: Breast | Laterality: Left

## 2021-09-29 MED ORDER — BUPIVACAINE-EPINEPHRINE (PF) 0.25% -1:200000 IJ SOLN
INTRAMUSCULAR | Status: AC
  Filled 2021-09-29: qty 30

## 2021-09-29 MED ORDER — OXYCODONE HCL 5 MG/5ML PO SOLN: 5.0000 mg | Freq: Once | ORAL | Status: DC | PRN

## 2021-09-29 MED ORDER — ACETAMINOPHEN 10 MG/ML IV SOLN: 1000.0000 mg | Freq: Once | INTRAVENOUS | Status: DC | PRN

## 2021-09-29 MED ORDER — PROPOFOL 10 MG/ML IV BOLUS
INTRAVENOUS | Status: AC
  Filled 2021-09-29: qty 20

## 2021-09-29 MED ORDER — FENTANYL CITRATE (PF) 250 MCG/5ML IJ SOLN
INTRAMUSCULAR | Status: AC
  Filled 2021-09-29: qty 5

## 2021-09-29 MED ORDER — ACETAMINOPHEN 500 MG PO TABS
1000.0000 mg | ORAL_TABLET | ORAL | Status: AC
  Administered 2021-09-29: 1000 mg via ORAL
  Filled 2021-09-29: qty 2

## 2021-09-29 MED ORDER — OXYCODONE HCL 5 MG PO TABS: 5.0000 mg | ORAL_TABLET | Freq: Once | ORAL | Status: DC | PRN

## 2021-09-29 MED ORDER — LIDOCAINE 2% (20 MG/ML) 5 ML SYRINGE
INTRAMUSCULAR | Status: DC | PRN
  Administered 2021-09-29: 80 mg via INTRAVENOUS

## 2021-09-29 MED ORDER — ACETAMINOPHEN 325 MG PO TABS: 325.0000 mg | ORAL_TABLET | ORAL | Status: DC | PRN

## 2021-09-29 MED ORDER — ACETAMINOPHEN 160 MG/5ML PO SOLN: 325.0000 mg | ORAL | Status: DC | PRN

## 2021-09-29 MED ORDER — FENTANYL CITRATE (PF) 100 MCG/2ML IJ SOLN
INTRAMUSCULAR | Status: DC | PRN
  Administered 2021-09-29 (×2): 50 ug via INTRAVENOUS

## 2021-09-29 MED ORDER — CHLORHEXIDINE GLUCONATE CLOTH 2 % EX PADS: 6.0000 | MEDICATED_PAD | Freq: Once | CUTANEOUS | Status: DC

## 2021-09-29 MED ORDER — AMISULPRIDE (ANTIEMETIC) 5 MG/2ML IV SOLN: 10.0000 mg | Freq: Once | INTRAVENOUS | Status: DC | PRN

## 2021-09-29 MED ORDER — DEXAMETHASONE SODIUM PHOSPHATE 10 MG/ML IJ SOLN
INTRAMUSCULAR | Status: DC | PRN
  Administered 2021-09-29: 10 mg via INTRAVENOUS

## 2021-09-29 MED ORDER — FENTANYL CITRATE PF 50 MCG/ML IJ SOSY
PREFILLED_SYRINGE | INTRAMUSCULAR | Status: AC
  Filled 2021-09-29: qty 1

## 2021-09-29 MED ORDER — ONDANSETRON HCL 4 MG/2ML IJ SOLN
INTRAMUSCULAR | Status: DC | PRN
  Administered 2021-09-29: 4 mg via INTRAVENOUS

## 2021-09-29 MED ORDER — CEFAZOLIN SODIUM-DEXTROSE 2-4 GM/100ML-% IV SOLN
2.0000 g | INTRAVENOUS | Status: AC
  Administered 2021-09-29: 2 g via INTRAVENOUS
  Filled 2021-09-29: qty 100

## 2021-09-29 MED ORDER — LACTATED RINGERS IV SOLN: INTRAVENOUS | Status: DC

## 2021-09-29 MED ORDER — BUPIVACAINE-EPINEPHRINE 0.25% -1:200000 IJ SOLN
INTRAMUSCULAR | Status: DC | PRN
  Administered 2021-09-29: 10 mL

## 2021-09-29 MED ORDER — FENTANYL CITRATE PF 50 MCG/ML IJ SOSY
25.0000 ug | PREFILLED_SYRINGE | INTRAMUSCULAR | Status: DC | PRN
  Administered 2021-09-29 (×3): 25 ug via INTRAVENOUS

## 2021-09-29 MED ORDER — ORAL CARE MOUTH RINSE: 15.0000 mL | Freq: Once | OROMUCOSAL | Status: AC

## 2021-09-29 MED ORDER — FENTANYL CITRATE PF 50 MCG/ML IJ SOSY: 25.0000 ug | PREFILLED_SYRINGE | INTRAMUSCULAR | Status: DC | PRN

## 2021-09-29 MED ORDER — PROPOFOL 10 MG/ML IV BOLUS
INTRAVENOUS | Status: DC | PRN
  Administered 2021-09-29: 120 mg via INTRAVENOUS

## 2021-09-29 MED ORDER — CHLORHEXIDINE GLUCONATE 0.12 % MT SOLN
15.0000 mL | Freq: Once | OROMUCOSAL | Status: AC
  Administered 2021-09-29: 15 mL via OROMUCOSAL

## 2021-09-29 SURGICAL SUPPLY — 44 items
APL PRP STRL LF DISP 70% ISPRP (MISCELLANEOUS) ×1
BAG COUNTER SPONGE SURGICOUNT (BAG) IMPLANT
BAG SPNG CNTER NS LX DISP (BAG)
BLADE HEX COATED 2.75 (ELECTRODE) ×2 IMPLANT
BLADE SURG 15 STRL LF DISP TIS (BLADE) ×3 IMPLANT
BLADE SURG 15 STRL SS (BLADE) ×6
CHLORAPREP W/TINT 26 (MISCELLANEOUS) ×1 IMPLANT
COVER SURGICAL LIGHT HANDLE (MISCELLANEOUS) ×2 IMPLANT
DERMABOND ADVANCED (GAUZE/BANDAGES/DRESSINGS) ×1
DERMABOND ADVANCED .7 DNX12 (GAUZE/BANDAGES/DRESSINGS) IMPLANT
DRAPE LAPAROSCOPIC ABDOMINAL (DRAPES) ×2 IMPLANT
DRAPE UTILITY XL STRL (DRAPES) ×1 IMPLANT
ELECT REM PT RETURN 15FT ADLT (MISCELLANEOUS) ×2 IMPLANT
GAUZE 4X4 16PLY ~~LOC~~+RFID DBL (SPONGE) ×2 IMPLANT
GAUZE SPONGE 4X4 12PLY STRL (GAUZE/BANDAGES/DRESSINGS) IMPLANT
GLOVE BIO SURGEON STRL SZ7 (GLOVE) ×2 IMPLANT
GLOVE BIOGEL PI IND STRL 7.0 (GLOVE) ×1 IMPLANT
GLOVE BIOGEL PI IND STRL 7.5 (GLOVE) ×1 IMPLANT
GLOVE BIOGEL PI INDICATOR 7.0 (GLOVE) ×1
GLOVE BIOGEL PI INDICATOR 7.5 (GLOVE) ×1
GOWN STRL REUS W/ TWL LRG LVL3 (GOWN DISPOSABLE) ×2 IMPLANT
GOWN STRL REUS W/ TWL XL LVL3 (GOWN DISPOSABLE) ×1 IMPLANT
GOWN STRL REUS W/TWL LRG LVL3 (GOWN DISPOSABLE) ×4
GOWN STRL REUS W/TWL XL LVL3 (GOWN DISPOSABLE) ×2
KIT BASIN OR (CUSTOM PROCEDURE TRAY) ×2 IMPLANT
KIT TURNOVER KIT A (KITS) IMPLANT
MARKER SKIN DUAL TIP RULER LAB (MISCELLANEOUS) ×2 IMPLANT
NDL HYPO 25X1 1.5 SAFETY (NEEDLE) ×1 IMPLANT
NEEDLE HYPO 22GX1.5 SAFETY (NEEDLE) IMPLANT
NEEDLE HYPO 25X1 1.5 SAFETY (NEEDLE) ×2 IMPLANT
PACK BASIC VI WITH GOWN DISP (CUSTOM PROCEDURE TRAY) ×2 IMPLANT
PENCIL SMOKE EVACUATOR (MISCELLANEOUS) IMPLANT
SPIKE FLUID TRANSFER (MISCELLANEOUS) ×2 IMPLANT
STRIP CLOSURE SKIN 1/2X4 (GAUZE/BANDAGES/DRESSINGS) ×2 IMPLANT
SUT MNCRL AB 4-0 PS2 18 (SUTURE) ×2 IMPLANT
SUT SILK 2 0 SH (SUTURE) ×1 IMPLANT
SUT VIC AB 2-0 SH 27 (SUTURE) ×4
SUT VIC AB 2-0 SH 27X BRD (SUTURE) ×1 IMPLANT
SUT VIC AB 3-0 SH 27 (SUTURE) ×2
SUT VIC AB 3-0 SH 27XBRD (SUTURE) ×1 IMPLANT
SYR BULB IRRIG 60ML STRL (SYRINGE) ×1 IMPLANT
SYR CONTROL 10ML LL (SYRINGE) ×2 IMPLANT
TOWEL OR 17X26 10 PK STRL BLUE (TOWEL DISPOSABLE) ×2 IMPLANT
TOWEL OR NON WOVEN STRL DISP B (DISPOSABLE) ×2 IMPLANT

## 2021-09-29 NOTE — Anesthesia Postprocedure Evaluation (Signed)
Anesthesia Post Note  Patient: Rachel Villarreal  Procedure(s) Performed: LEFT  BREAST RE-EXCISION LUMPECTOMY (Left: Breast)     Patient location during evaluation: PACU Anesthesia Type: General Level of consciousness: awake and alert Pain management: pain level controlled Vital Signs Assessment: post-procedure vital signs reviewed and stable Respiratory status: spontaneous breathing, nonlabored ventilation, respiratory function stable and patient connected to nasal cannula oxygen Cardiovascular status: blood pressure returned to baseline and stable Postop Assessment: no apparent nausea or vomiting Anesthetic complications: no   No notable events documented.  Last Vitals:  Vitals:   09/29/21 0908 09/29/21 0910  BP:  (!) 149/73  Pulse: 77 79  Resp: 12 12  Temp:  36.4 C  SpO2: 93% 94%    Last Pain:  Vitals:   09/29/21 0910  TempSrc:   PainSc: 0-No pain                 Effie Berkshire

## 2021-09-29 NOTE — Op Note (Signed)
Preoperative diagnosis: Clinical stage 1 left breast cancer with pos margin after lumpectomy Postoperative diagnosis: Same as above Procedure: Left breast margin re-excision Surgeon: Dr. Serita Grammes Anesthesia: General  Estimated blood loss: Minimal Complications: None Drains: None Specimens: posterior margin marked short superior, long lateral, double deep Sponge needle count was correct at completion Decision to recovery stable condition  Indications: 76 y.o. female who underwent a screening mammogram that shows C density breast. There is a left-sided distortion noted. On ultrasound there is a mass in the upper outer quadrant that measures 1.1 x 1 x 0.7 cm. Her axillary ultrasound is negative. Biopsy shows an invasive lobular carcinoma that is 100% ER positive, 95% PR positive, HER2 negative, and Ki-67 is 5% I did a lumpectomy and the posterior margin was positive. We discussed re-excision lumpectomy.   Procedure:  After informed consent was obtained she was taken to the OR. She was given antibiotics.  SCDs were placed.  She was then placed under general anesthesia without complication.  She was prepped and draped in the standard sterile surgical fashion.  A surgical timeout was then performed.  I reentered the old incision and released the sutures.  I then removed the posterior margin. This is now the muscle. This is marked as above.  Hemostasis was observed. I closed this with 2-0 vicryl for the breast tissue. Hemostasis was observed.  I then closed skin with 3-0 vicryl and 4-0 monocryl. Glue was placed.   she tolerated well, was extubated and transferred to recovery stable

## 2021-09-29 NOTE — Transfer of Care (Signed)
Immediate Anesthesia Transfer of Care Note  Patient: Rachel Villarreal  Procedure(s) Performed: LEFT  BREAST RE-EXCISION LUMPECTOMY (Left: Breast)  Patient Location: PACU  Anesthesia Type:General  Level of Consciousness: sedated, patient cooperative and responds to stimulation  Airway & Oxygen Therapy: Patient Spontanous Breathing and Patient connected to face mask oxygen  Post-op Assessment: Report given to RN and Post -op Vital signs reviewed and stable  Post vital signs: Reviewed and stable  Last Vitals:  Vitals Value Taken Time  BP 136/67 09/29/21 0831  Temp    Pulse 77 09/29/21 0832  Resp 12 09/29/21 0832  SpO2 100 % 09/29/21 0832  Vitals shown include unvalidated device data.  Last Pain:  Vitals:   09/29/21 0643  TempSrc:   PainSc: 0-No pain         Complications: No notable events documented.

## 2021-09-29 NOTE — Anesthesia Preprocedure Evaluation (Addendum)
Anesthesia Evaluation  Patient identified by MRN, date of birth, ID band Patient awake    Reviewed: Allergy & Precautions, NPO status , Patient's Chart, lab work & pertinent test results  Airway Mallampati: I  TM Distance: >3 FB Neck ROM: Full    Dental  (+) Teeth Intact, Dental Advisory Given   Pulmonary asthma , former smoker,    breath sounds clear to auscultation       Cardiovascular hypertension, Pt. on medications  Rhythm:Regular Rate:Normal     Neuro/Psych negative neurological ROS  negative psych ROS   GI/Hepatic Neg liver ROS, GERD  Medicated,  Endo/Other  negative endocrine ROS  Renal/GU Renal disease     Musculoskeletal negative musculoskeletal ROS (+)   Abdominal Normal abdominal exam  (+)   Peds  Hematology negative hematology ROS (+)   Anesthesia Other Findings   Reproductive/Obstetrics                            Anesthesia Physical Anesthesia Plan  ASA: 3  Anesthesia Plan: General   Post-op Pain Management:    Induction: Intravenous  PONV Risk Score and Plan: 4 or greater and Ondansetron, Dexamethasone and Treatment may vary due to age or medical condition  Airway Management Planned: LMA  Additional Equipment: None  Intra-op Plan:   Post-operative Plan: Extubation in OR  Informed Consent: I have reviewed the patients History and Physical, chart, labs and discussed the procedure including the risks, benefits and alternatives for the proposed anesthesia with the patient or authorized representative who has indicated his/her understanding and acceptance.     Dental advisory given  Plan Discussed with: CRNA  Anesthesia Plan Comments:        Anesthesia Quick Evaluation

## 2021-09-29 NOTE — Interval H&P Note (Signed)
History and Physical Interval Note:  09/29/2021 7:03 AM  Rachel Villarreal  has presented today for surgery, with the diagnosis of LEFT BREAST CANCER.  The various methods of treatment have been discussed with the patient and family. After consideration of risks, benefits and other options for treatment, the patient has consented to  Procedure(s): LEFT  BREAST RE-EXCISION LUMPECTOMY (Left) as a surgical intervention.  The patient's history has been reviewed, patient examined, no change in status, stable for surgery.  I have reviewed the patient's chart and labs.  Questions were answered to the patient's satisfaction.     Rolm Bookbinder

## 2021-09-29 NOTE — Anesthesia Procedure Notes (Signed)
Procedure Name: LMA Insertion Date/Time: 09/29/2021 7:53 AM  Performed by: Gean Maidens, CRNAPre-anesthesia Checklist: Patient identified, Emergency Drugs available, Suction available, Patient being monitored and Timeout performed Patient Re-evaluated:Patient Re-evaluated prior to induction Oxygen Delivery Method: Circle system utilized Preoxygenation: Pre-oxygenation with 100% oxygen Induction Type: IV induction Ventilation: Mask ventilation without difficulty LMA: LMA inserted LMA Size: 4.0 Number of attempts: 1 Placement Confirmation: positive ETCO2 and breath sounds checked- equal and bilateral Tube secured with: Tape Dental Injury: Teeth and Oropharynx as per pre-operative assessment

## 2021-09-29 NOTE — Discharge Instructions (Signed)
[facility] Office Phone Number [phone]  POST OP INSTRUCTIONS Take 400 mg of ibuprofen every 8 hours or 650 mg tylenol every 6 hours for next 72 hours then as needed. Use ice several times daily also.  A prescription for pain medication may be given to you upon discharge.  Take your pain medication as prescribed, if needed.  If narcotic pain medicine is not needed, then you may take acetaminophen (Tylenol), naprosyn (Alleve) or ibuprofen (Advil) as needed. Take your usually prescribed medications unless otherwise directed If you need a refill on your pain medication, please contact your pharmacy.  They will contact our office to request authorization.  Prescriptions will not be filled after 5pm or on week-ends. You should eat very light the first 24 hours after surgery, such as soup, crackers, pudding, etc.  Resume your normal diet the day after surgery. Most patients will experience some swelling and bruising in the breast.  Ice packs and a good support bra will help.  Wear the breast binder provided or a sports bra for 72 hours day and night.  After that wear a sports bra during the day until you return to the office. Swelling and bruising can take several days to resolve.  It is common to experience some constipation if taking pain medication after surgery.  Increasing fluid intake and taking a stool softener will usually help or prevent this problem from occurring.  A mild laxative (Milk of Magnesia or Miralax) should be taken according to package directions if there are no bowel movements after 48 hours. I used skin glue on the incision, you may shower in 24 hours.  The glue will flake off over the next 2-3 weeks.  Any sutures or staples will be removed at the office during your follow-up visit. ACTIVITIES:  You may resume regular daily activities (gradually increasing) beginning the next day.  Wearing a good support bra or sports bra minimizes pain and swelling.  You may have  sexual intercourse when it is comfortable. You may drive when you no longer are taking prescription pain medication, you can comfortably wear a seatbelt, and you can safely maneuver your car and apply brakes. RETURN TO WORK:  ______________________________________________________________________________________ You should see your doctor in the office for a follow-up appointment approximately two weeks after your surgery.  Your doctor's nurse will typically make your follow-up appointment when she calls you with your pathology report.  Expect your pathology report 3-4 business days after your surgery.  You may call to check if you do not hear from us after three days. OTHER INSTRUCTIONS: _______________________________________________________________________________________________ _____________________________________________________________________________________________________________________________________ _____________________________________________________________________________________________________________________________________ _____________________________________________________________________________________________________________________________________  WHEN TO CALL DR Shayden Bobier: Fever over 101.0 Nausea and/or vomiting. Extreme swelling or bruising. Continued bleeding from incision. Increased pain, redness, or drainage from the incision.  The clinic staff is available to answer your questions during regular business hours.  Please don't hesitate to call and ask to speak to one of the nurses for clinical concerns.  If you have a medical emergency, go to the nearest emergency room or call 911.  A surgeon from [facility] is always on call at the hospital.  For further questions, please visit [email or web address]  

## 2021-09-30 ENCOUNTER — Encounter (HOSPITAL_COMMUNITY): Payer: Self-pay | Admitting: General Surgery

## 2021-10-03 LAB — SURGICAL PATHOLOGY

## 2021-10-07 ENCOUNTER — Encounter (HOSPITAL_COMMUNITY): Payer: Self-pay

## 2021-10-11 ENCOUNTER — Other Ambulatory Visit: Payer: Self-pay

## 2021-10-11 ENCOUNTER — Emergency Department (HOSPITAL_BASED_OUTPATIENT_CLINIC_OR_DEPARTMENT_OTHER)
Admission: EM | Admit: 2021-10-11 | Discharge: 2021-10-11 | Disposition: A | Discharge status: Home / Self Care | Payer: Medicare Other | Attending: Emergency Medicine | Admitting: Emergency Medicine

## 2021-10-11 DIAGNOSIS — D649 Anemia, unspecified: Secondary | ICD-10-CM | POA: Diagnosis not present

## 2021-10-11 DIAGNOSIS — T8149XA Infection following a procedure, other surgical site, initial encounter: Secondary | ICD-10-CM | POA: Diagnosis not present

## 2021-10-11 LAB — COMPREHENSIVE METABOLIC PANEL
ALT: 7 U/L (ref 0–44)
AST: 12 U/L — ABNORMAL LOW (ref 15–41)
Albumin: 4.3 g/dL (ref 3.5–5.0)
Alkaline Phosphatase: 60 U/L (ref 38–126)
Anion gap: 15 (ref 5–15)
BUN: 52 mg/dL — ABNORMAL HIGH (ref 8–23)
CO2: 18 mmol/L — ABNORMAL LOW (ref 22–32)
Calcium: 9.7 mg/dL (ref 8.9–10.3)
Chloride: 107 mmol/L (ref 98–111)
Creatinine, Ser: 3.5 mg/dL — ABNORMAL HIGH (ref 0.44–1.00)
GFR, Estimated: 13 mL/min — ABNORMAL LOW (ref >60)
Glucose, Bld: 104 mg/dL — ABNORMAL HIGH (ref 70–99)
Potassium: 4.7 mmol/L (ref 3.5–5.1)
Sodium: 140 mmol/L (ref 135–145)
Total Bilirubin: 0.5 mg/dL (ref 0.3–1.2)
Total Protein: 7.2 g/dL (ref 6.5–8.1)

## 2021-10-11 LAB — CBC WITH DIFFERENTIAL/PLATELET
Abs Immature Granulocytes: 0.13 10*3/uL — ABNORMAL HIGH (ref 0.00–0.07)
Basophils Absolute: 0 10*3/uL (ref 0.0–0.1)
Basophils Relative: 0 %
Eosinophils Absolute: 0.2 10*3/uL (ref 0.0–0.5)
Eosinophils Relative: 3 %
HCT: 32.4 % — ABNORMAL LOW (ref 36.0–46.0)
Hemoglobin: 10.5 g/dL — ABNORMAL LOW (ref 12.0–15.0)
Immature Granulocytes: 2 %
Lymphocytes Relative: 14 %
Lymphs Abs: 1.2 10*3/uL (ref 0.7–4.0)
MCH: 28.5 pg (ref 26.0–34.0)
MCHC: 32.4 g/dL (ref 30.0–36.0)
MCV: 88 fL (ref 80.0–100.0)
Monocytes Absolute: 0.9 10*3/uL (ref 0.1–1.0)
Monocytes Relative: 10 %
Neutro Abs: 6.3 10*3/uL (ref 1.7–7.7)
Neutrophils Relative %: 71 %
Platelets: 302 10*3/uL (ref 150–400)
RBC: 3.68 MIL/uL — ABNORMAL LOW (ref 3.87–5.11)
RDW: 13.3 % (ref 11.5–15.5)
WBC: 8.7 10*3/uL (ref 4.0–10.5)
nRBC: 0 % (ref 0.0–0.2)

## 2021-10-11 LAB — LACTIC ACID, PLASMA: Lactic Acid, Venous: 0.5 mmol/L (ref 0.5–1.9)

## 2021-10-11 MED ORDER — CEPHALEXIN 250 MG PO CAPS
250.0000 mg | ORAL_CAPSULE | Freq: Once | ORAL | Status: AC
  Administered 2021-10-11: 250 mg via ORAL
  Filled 2021-10-11: qty 1

## 2021-10-11 MED ORDER — CEPHALEXIN 500 MG PO CAPS: 500.0000 mg | ORAL_CAPSULE | Freq: Four times a day (QID) | ORAL | 0 refills | Status: DC

## 2021-10-11 MED ORDER — DOXYCYCLINE HYCLATE 100 MG PO CAPS: 100.0000 mg | ORAL_CAPSULE | Freq: Two times a day (BID) | ORAL | 0 refills | Status: DC

## 2021-10-11 NOTE — ED Provider Notes (Signed)
[facility] EMERGENCY DEPT Provider Note   CSN: [id number] Arrival date & time: 10/11/21  3710     History {Add pertinent medical, surgical, social history, OB history to HPI:1} Chief Complaint  Patient presents with   Wound Check    Left Breast    Rachel Villarreal is a 76 y.o. female.  HPI 76 year old female with lumpectomy x2 first on 6 5 and second on 622.  She presents today complaining of some discharge and itching around the wound she is scheduled to start radiation therapy in a couple of weeks.  She has seen Dr. Donne Hazel in follow-up and there were not noted to be any problems.    Home Medications Prior to Admission medications   Medication Sig Start Date End Date Taking? Authorizing Provider  acetaminophen (TYLENOL) 325 MG tablet Take 650 mg by mouth every 6 (six) hours as needed for moderate pain.    [provider]  amLODipine (NORVASC) 2.5 MG tablet Take 7.5 mg by mouth daily. 07/27/21   [provider]  atorvastatin (LIPITOR) 10 MG tablet Take 10 mg by mouth daily.    [provider]  budesonide (RHINOCORT AQUA) 32 MCG/ACT nasal spray Place 2 sprays into both nostrils daily.    [provider]  Cholecalciferol (VITAMIN D3) 2000 units TABS Take 2,000 mg by mouth daily.    [provider]  Coenzyme Q10 (CO Q-10) 200 MG CAPS Take 200 mg by mouth daily.    [provider]  diclofenac Sodium (VOLTAREN) 1 % GEL Apply 2 g topically 4 (four) times daily as needed (leg pain).    [provider]  famotidine (PEPCID) 20 MG tablet Take 20 mg by mouth every evening.    [provider]  febuxostat (ULORIC) 40 MG tablet Take 40 mg by mouth daily.    [provider]  hydrochlorothiazide (HYDRODIURIL) 12.5 MG tablet Take 12.5 mg by mouth daily. 07/27/21   [provider]  loratadine (CLARITIN) 10 MG tablet Take 10 mg by mouth 2 (two) times daily.     [provider]  losartan  (COZAAR) 100 MG tablet Take 100 mg by mouth daily. 07/27/21   [provider]  melatonin 5 MG TABS Take 1 tablet (5 mg total) by mouth at bedtime as needed. 08/29/21   Nicholas Lose, MD  pantoprazole (PROTONIX) 40 MG tablet Take 40 mg by mouth daily.    [provider]  PROAIR HFA 108 (90 BASE) MCG/ACT inhaler Inhale 2 puffs into the lungs every 6 (six) hours as needed (for respiratory issues.).  12/03/14   [provider]      Allergies    Ace inhibitors, Lactose intolerance (gi), Lisinopril, Mercury, and Tape    Review of Systems   Review of Systems  Physical Exam Updated Vital Signs BP (!) 176/85   Pulse 85   Temp 97.7 F (36.5 C)   Resp 14   Ht 1.575 m ('5\' 2"'$ )   Wt 73.7 kg   SpO2 98%   BMI 29.72 kg/m  Physical Exam Vitals and nursing note reviewed.  Constitutional:      General: She is not in acute distress.    Appearance: Normal appearance. She is well-developed.  HENT:     Head: Normocephalic and atraumatic.     Right Ear: External ear normal.     Left Ear: External ear normal.     Nose: Nose normal.  Eyes:     Conjunctiva/sclera: Conjunctivae normal.  Pupils: Pupils are equal, round, and reactive to light.  Cardiovascular:     Rate and Rhythm: Normal rate.  Pulmonary:     Effort: Pulmonary effort is normal.  Abdominal:     Palpations: Abdomen is soft.  Genitourinary:    Comments: Left breast with healing 4 cm laceration with some surrounding erythema and induration below There is some discharge from the superior medial wound area. Musculoskeletal:        General: Normal range of motion.     Cervical back: Normal range of motion and neck supple.  Skin:    General: Skin is warm and dry.  Neurological:     Mental Status: She is alert and oriented to person, place, and time.     Motor: No abnormal muscle tone.     Coordination: Coordination normal.  Psychiatric:        Behavior: Behavior normal.        Thought Content: Thought  content normal.        ED Results / Procedures / Treatments   Labs (all labs ordered are listed, but only abnormal results are displayed) Labs Reviewed  COMPREHENSIVE METABOLIC PANEL - Abnormal; Notable for the following components:      Result Value   CO2 18 (*)    Glucose, Bld 104 (*)    BUN 52 (*)    Creatinine, Ser 3.50 (*)    AST 12 (*)    GFR, Estimated 13 (*)    All other components within normal limits  CBC WITH DIFFERENTIAL/PLATELET - Abnormal; Notable for the following components:   RBC 3.68 (*)    Hemoglobin 10.5 (*)    HCT 32.4 (*)    Abs Immature Granulocytes 0.13 (*)    All other components within normal limits  LACTIC ACID, PLASMA  LACTIC ACID, PLASMA    EKG None  Radiology No results found.  Procedures Ultrasound ED Soft Tissue  Date/Time: 10/11/2021 11:31 AM  Performed by: Pattricia Boss, MD Authorized by: Pattricia Boss, MD   Procedure details:    Indications: localization of abscess     Transverse view:  Visualized   Longitudinal view:  Visualized   Images: not archived   Location:    Location: breast     Side:  Left Findings:     abscess present Comments:     1.7 x 1 cm abscess   {Document cardiac monitor, telemetry assessment procedure when appropriate:1}  Medications Ordered in ED Medications - No data to display  ED Course/ Medical Decision Making/ A&P Clinical Course as of 10/11/21 1133  Tue Oct 11, 2021  1132 Comprehensive metabolic panel(!) Complete metabolic panel reviewed and interpreted with creatinine increased to 3.5 which appears stable from her past several [DR]  1132 CBC with Differential(!) CBC reviewed interpreted with anemia stable from prior [DR]  1132 Lactic acid, plasma Lactic acid reviewed interpreted within normal limits [DR]    Clinical Course User Index [DR] Pattricia Boss, MD                           Medical Decision Making Amount and/or Complexity of Data Reviewed Labs: ordered.     {Document  critical care time when appropriate:1} {Document review of labs and clinical decision tools ie heart score, Chads2Vasc2 etc:1}  {Document your independent review of radiology images, and any outside records:1} {Document your discussion with family members, caretakers, and with consultants:1} {Document social determinants of health affecting pt's care:1} {  Document your decision making why or why not admission, treatments were needed:1} Final Clinical Impression(s) / ED Diagnoses Final diagnoses:  None    Rx / DC Orders ED Discharge Orders     None

## 2021-10-11 NOTE — Discharge Instructions (Addendum)
Please keep area warm and dry and use warm compresses Take doxycycline as prescribed Call Dr. Cristal Generous office tomorrow for recheck Return if you have rapidly spreading redness, increased discharge, fever, or chills, or worsening time

## 2021-10-11 NOTE — ED Triage Notes (Addendum)
Patient arrives with complaints of increased redness to left breast.  Patient had a lumpectomy earlier this month and had to have a repeat procedure to remove more tissue on June 25.  Patient report a history of Chronic Kidney disease.

## 2021-10-12 ENCOUNTER — Encounter: Payer: Self-pay | Admitting: *Deleted

## 2021-10-18 DIAGNOSIS — Z961 Presence of intraocular lens: Secondary | ICD-10-CM | POA: Diagnosis not present

## 2021-10-18 DIAGNOSIS — H353132 Nonexudative age-related macular degeneration, bilateral, intermediate dry stage: Secondary | ICD-10-CM | POA: Diagnosis not present

## 2021-10-18 DIAGNOSIS — H5211 Myopia, right eye: Secondary | ICD-10-CM | POA: Diagnosis not present

## 2021-10-18 DIAGNOSIS — H401131 Primary open-angle glaucoma, bilateral, mild stage: Secondary | ICD-10-CM | POA: Diagnosis not present

## 2021-10-20 ENCOUNTER — Encounter (HOSPITAL_BASED_OUTPATIENT_CLINIC_OR_DEPARTMENT_OTHER): Payer: Self-pay | Admitting: General Surgery

## 2021-10-25 DIAGNOSIS — Z170 Estrogen receptor positive status [ER+]: Secondary | ICD-10-CM | POA: Diagnosis not present

## 2021-10-25 DIAGNOSIS — C50412 Malignant neoplasm of upper-outer quadrant of left female breast: Secondary | ICD-10-CM | POA: Diagnosis not present

## 2021-10-25 NOTE — Progress Notes (Signed)
Location of Breast Cancer:  Malignant neoplasm of upper-outer quadrant of left breast in female, estrogen receptor positive  Histology per Pathology Report:  09/29/2021 FINAL MICROSCOPIC DIAGNOSIS:  A. BREAST, LEFT, RE-EXCISION, LUMPECTOMY:  - Predominantly adipose tissue and minute portions of normal breast tissue with foreign body type granulomas and surgical site changes.  - DCIS and invasive carcinoma are not identified in any of the sections  examined   09/12/2021 FINAL MICROSCOPIC DIAGNOSIS:  A.   BREAST, LEFT, LUMPECTOMY:  - Invasive lobular carcinoma.  - See surgical pathology cancer case summary for details.  B.   BREAST, LEFT ADDITIONAL ANTERIOR SUPERIOR MARGIN, EXCISION:  - Negative for malignancy.   Receptor Status: ER(100%), PR (95%), Her2-neu (Negative), Ki-67(5%)  Past/Anticipated interventions by surgeon, if any:  10/21/2021 --Dr. Rolm Bookbinder (office visit) She on the fourth had a wound infection diagnosed in the emergency room has been treated with antibiotics this is much better now.  She has completed abx now and is doing well  Assessment and Plan:  I do not think she has any more infection.  I think she is okay to proceed to seeing radiation oncology next week.  She is going to call me back as needed. Return in about 6 months (around 04/23/2022).  09/12/2021 --Dr. Rolm Bookbinder  Left breast radioactive seed guided lumpectomy  Past/Anticipated interventions by medical oncology, if any:  Under care of Dr. Nicholas Lose 09/21/2021 --Plan: Margin excision Adjuvant radiation therapy followed by Adjuvant antiestrogen therapy with Letrozole She took of her estrogen patch off, hot flashes came back (she was using it for incontinence) --RTC after XRT is complete  Lymphedema issues, if any:  Denies    Pain issues, if any:  Patient denies   SAFETY ISSUES: Prior radiation? No Pacemaker/ICD? No Possible current pregnancy? No--hysterectomy Is the  patient on methotrexate? No  Current Complaints / other details:  Nothing else of note

## 2021-10-25 NOTE — Progress Notes (Signed)
Radiation Oncology         [phone] ________________________________  Name: Rachel Villarreal MRN: [id number]  Date: 10/26/2021  DOB: 1945-05-11  Follow-Up Visit Note  Outpatient  CC: Wenda Low, MD  Nicholas Lose, MD  Diagnosis:   No diagnosis found.   S/p Lumpectomy: Left Breast UOQ, Invasive Lobular Carcinoma, ER+ / PR+ / Her2-, Grade 1   Cancer Staging  Malignant neoplasm of upper-outer quadrant of left breast in female, estrogen receptor positive ([place]) Staging form: Breast, AJCC 8th Edition - Clinical stage from 08/29/2021: Stage IA (cT1c, cN0, cM0, G2, ER+, PR+, HER2-) - Signed by Nicholas Lose, MD on 08/29/2021  CHIEF COMPLAINT: Here to discuss management of left breast cancer  Narrative:  The patient returns today for follow-up.     Since consultation date of 08/30/21, the patient opted to proceed with left breast lumpectomy without nodal biopsies on 09/12/21 under the care of Dr. Donne Hazel. Pathology from the procedure revealed: tumor size of 1.1 x 1.1 x 1.0 cm; histology of grade 1 invasive lobular carcinoma; posterior margin is positive for invasive carcinoma; no lymph nodes were examined;  ER status: 100% positive and PR status 95% positive, both with strong staining intensity; Proliferation marker Ki67 at 5%; Her2 status negative; Grade 1.  Accordingly, the patient underwent re-excision of the positive posterior margin on 09/29/21. Final pathology revealed no evidence of carcinoma in any of the sections examined.   On 10/11/21, the patient presented to the ED with the cc of discharge and itching around the surgical site (see photos of infected site on ED note). She was accordingly treated for infection with Keflex and doxycycline.   During her most recent post-op follow up visits with Dr. Donne Hazel on 10/14/21 and 10/21/21, the patients wound was seen to show significant improvement on examination following abx. Some very mild erythema and induration were still  appreciated around the incision but no signs of infection or abscess were present.    The patient will return to Dr. Lindi Adie in the near future to discuss antiestrogen treatment options following XRT.  Symptomatically, the patient reports: ***        ALLERGIES:  is allergic to ace inhibitors, lactose intolerance (gi), lisinopril, mercury, and tape.  Meds: Current Outpatient Medications  Medication Sig Dispense Refill   acetaminophen (TYLENOL) 325 MG tablet Take 650 mg by mouth every 6 (six) hours as needed for moderate pain.     amLODipine (NORVASC) 2.5 MG tablet Take 7.5 mg by mouth daily.     atorvastatin (LIPITOR) 10 MG tablet Take 10 mg by mouth daily.     budesonide (RHINOCORT AQUA) 32 MCG/ACT nasal spray Place 2 sprays into both nostrils daily.     Cholecalciferol (VITAMIN D3) 2000 units TABS Take 2,000 mg by mouth daily.     Coenzyme Q10 (CO Q-10) 200 MG CAPS Take 200 mg by mouth daily.     diclofenac Sodium (VOLTAREN) 1 % GEL Apply 2 g topically 4 (four) times daily as needed (leg pain).     doxycycline (VIBRAMYCIN) 100 MG capsule Take 1 capsule (100 mg total) by mouth 2 (two) times daily. 20 capsule 0   famotidine (PEPCID) 20 MG tablet Take 20 mg by mouth every evening.     febuxostat (ULORIC) 40 MG tablet Take 40 mg by mouth daily.     hydrochlorothiazide (HYDRODIURIL) 12.5 MG tablet Take 12.5 mg by mouth daily.     loratadine (CLARITIN) 10 MG tablet Take 10 mg by mouth  2 (two) times daily.      losartan (COZAAR) 100 MG tablet Take 100 mg by mouth daily.     melatonin 5 MG TABS Take 1 tablet (5 mg total) by mouth at bedtime as needed.  0   pantoprazole (PROTONIX) 40 MG tablet Take 40 mg by mouth daily.     PROAIR HFA 108 (90 BASE) MCG/ACT inhaler Inhale 2 puffs into the lungs every 6 (six) hours as needed (for respiratory issues.).      No current facility-administered medications for this encounter.    Physical Findings:  vitals were not taken for this visit. .      General: Alert and oriented, in no acute distress HEENT: Head is normocephalic. Extraocular movements are intact. Oropharynx is clear. Neck: Neck is supple, no palpable cervical or supraclavicular lymphadenopathy. Heart: Regular in rate and rhythm with no murmurs, rubs, or gallops. Chest: Clear to auscultation bilaterally, with no rhonchi, wheezes, or rales. Abdomen: Soft, nontender, nondistended, with no rigidity or guarding. Extremities: No cyanosis or edema. Lymphatics: see Neck Exam Musculoskeletal: symmetric strength and muscle tone throughout. Neurologic: No obvious focalities. Speech is fluent.  Psychiatric: Judgment and insight are intact. Affect is appropriate. Breast exam reveals ***  Lab Findings: Lab Results  Component Value Date   WBC 8.7 10/11/2021   HGB 10.5 (L) 10/11/2021   HCT 32.4 (L) 10/11/2021   MCV 88.0 10/11/2021   PLT 302 10/11/2021    '@LASTCHEMISTRY' @  Radiographic Findings: No results found.  Impression/Plan: We discussed adjuvant radiotherapy today.  I recommend *** in order to ***.  I reviewed the logistics, benefits, risks, and potential side effects of this treatment in detail. Risks may include but not necessary be limited to acute and late injury tissue in the radiation fields such as skin irritation (change in color/pigmentation, itching, dryness, pain, peeling). She may experience fatigue. We also discussed possible risk of long term cosmetic changes or scar tissue. There is also a smaller risk for lung toxicity, ***cardiac toxicity, ***brachial plexopathy, ***lymphedema, ***musculoskeletal changes, ***rib fragility or ***induction of a second malignancy, ***late chronic non-healing soft tissue wound.    The patient asked good questions which I answered to her satisfaction. She is enthusiastic about proceeding with treatment. A consent form has been *** signed and placed in her chart.  A total of *** medically necessary complex treatment devices  will be fabricated and supervised by me: *** fields with MLCs for custom blocks to protect heart, and lungs;  and, a Vac-lok. MORE COMPLEX DEVICES MAY BE MADE IN DOSIMETRY FOR FIELD IN FIELD BEAMS FOR DOSE HOMOGENEITY.  I have requested : 3D Simulation which is medically necessary to give adequate dose to at risk tissues while sparing lungs and heart.  I have requested a DVH of the following structures: lungs, heart, *** lumpectomy cavity.    The patient will receive *** Gy in *** fractions to the *** with *** fields.  This will be *** followed by a boost.  On date of service, in total, I spent *** minutes on this encounter. Patient was seen in person.  _____________________________________   Eppie Gibson, MD  This document serves as a record of services personally performed by Eppie Gibson, MD. It was created on her behalf by Roney Mans, a trained medical scribe. The creation of this record is based on the scribe's personal observations and the provider's statements to them. This document has been checked and approved by the attending provider.

## 2021-10-26 ENCOUNTER — Encounter: Payer: Self-pay | Admitting: Radiation Oncology

## 2021-10-26 ENCOUNTER — Ambulatory Visit
Admission: RE | Admit: 2021-10-26 | Discharge: 2021-10-26 | Disposition: A | Discharge status: Home / Self Care | Payer: Medicare Other | Source: Ambulatory Visit | Attending: Radiation Oncology | Admitting: Radiation Oncology

## 2021-10-26 ENCOUNTER — Other Ambulatory Visit: Payer: Self-pay

## 2021-10-26 VITALS — BP 165/73 | HR 94 | Temp 97.3°F | Resp 18 | Wt 162.2 lb

## 2021-10-26 DIAGNOSIS — Z510 Encounter for antineoplastic radiation therapy: Secondary | ICD-10-CM | POA: Insufficient documentation

## 2021-10-26 DIAGNOSIS — Z170 Estrogen receptor positive status [ER+]: Secondary | ICD-10-CM

## 2021-10-26 DIAGNOSIS — C50412 Malignant neoplasm of upper-outer quadrant of left female breast: Secondary | ICD-10-CM | POA: Insufficient documentation

## 2021-10-27 DIAGNOSIS — I10 Essential (primary) hypertension: Secondary | ICD-10-CM | POA: Diagnosis not present

## 2021-10-27 DIAGNOSIS — K219 Gastro-esophageal reflux disease without esophagitis: Secondary | ICD-10-CM | POA: Diagnosis not present

## 2021-10-27 DIAGNOSIS — N184 Chronic kidney disease, stage 4 (severe): Secondary | ICD-10-CM | POA: Diagnosis not present

## 2021-10-27 DIAGNOSIS — E7800 Pure hypercholesterolemia, unspecified: Secondary | ICD-10-CM | POA: Diagnosis not present

## 2021-10-27 DIAGNOSIS — H905 Unspecified sensorineural hearing loss: Secondary | ICD-10-CM | POA: Diagnosis not present

## 2021-10-28 DIAGNOSIS — Z170 Estrogen receptor positive status [ER+]: Secondary | ICD-10-CM | POA: Diagnosis not present

## 2021-10-28 DIAGNOSIS — Z510 Encounter for antineoplastic radiation therapy: Secondary | ICD-10-CM | POA: Diagnosis not present

## 2021-10-28 DIAGNOSIS — C50412 Malignant neoplasm of upper-outer quadrant of left female breast: Secondary | ICD-10-CM | POA: Diagnosis not present

## 2021-11-02 DIAGNOSIS — N184 Chronic kidney disease, stage 4 (severe): Secondary | ICD-10-CM | POA: Diagnosis not present

## 2021-11-02 DIAGNOSIS — C50919 Malignant neoplasm of unspecified site of unspecified female breast: Secondary | ICD-10-CM | POA: Diagnosis not present

## 2021-11-02 DIAGNOSIS — I10 Essential (primary) hypertension: Secondary | ICD-10-CM | POA: Diagnosis not present

## 2021-11-03 ENCOUNTER — Encounter: Payer: Self-pay | Admitting: *Deleted

## 2021-11-04 ENCOUNTER — Telehealth: Payer: Self-pay | Admitting: Hematology and Oncology

## 2021-11-04 NOTE — Telephone Encounter (Signed)
Scheduled per 7/27 in basket, pt has been called and confirmed

## 2021-11-07 ENCOUNTER — Ambulatory Visit
Admission: RE | Admit: 2021-11-07 | Discharge: 2021-11-07 | Disposition: A | Discharge status: Home / Self Care | Payer: Medicare Other | Source: Ambulatory Visit | Attending: Radiation Oncology | Admitting: Radiation Oncology

## 2021-11-07 ENCOUNTER — Other Ambulatory Visit: Payer: Self-pay

## 2021-11-07 DIAGNOSIS — Z170 Estrogen receptor positive status [ER+]: Secondary | ICD-10-CM | POA: Diagnosis not present

## 2021-11-07 DIAGNOSIS — C50412 Malignant neoplasm of upper-outer quadrant of left female breast: Secondary | ICD-10-CM | POA: Diagnosis not present

## 2021-11-07 DIAGNOSIS — Z510 Encounter for antineoplastic radiation therapy: Secondary | ICD-10-CM | POA: Diagnosis not present

## 2021-11-07 LAB — RAD ONC ARIA SESSION SUMMARY
Course Elapsed Days: 0
Plan Fractions Treated to Date: 1
Plan Prescribed Dose Per Fraction: 2.67 Gy
Plan Total Fractions Prescribed: 15
Plan Total Prescribed Dose: 40.05 Gy
Reference Point Dosage Given to Date: 2.67 Gy
Reference Point Session Dosage Given: 2.67 Gy
Session Number: 1

## 2021-11-07 MED ORDER — RADIAPLEXRX EX GEL: Freq: Once | CUTANEOUS | Status: AC

## 2021-11-07 MED ORDER — ALRA NON-METALLIC DEODORANT (RAD-ONC)
1.0000 | Freq: Once | TOPICAL | Status: AC
  Administered 2021-11-07: 1 via TOPICAL

## 2021-11-07 NOTE — Progress Notes (Signed)
Pt here for patient teaching. Pt given Radiation and You booklet, skin care instructions, Alra deodorant, and Radiaplex gel. Reviewed areas of pertinence such as fatigue, hair loss, nausea and vomiting, skin changes, breast tenderness, and breast swelling. Pt able to give teach back of to pat skin, use unscented/gentle soap, have Imodium on hand, and drink plenty of water, apply Radiaplex bid, apply Sonafine bid, avoid applying anything to skin within 4 hours of treatment, avoid wearing an under wire bra, and to use an electric razor if they must shave. Pt verbalizes understanding of information given and will contact nursing with any questions or concerns.     [email or web address]

## 2021-11-08 ENCOUNTER — Ambulatory Visit
Admission: RE | Admit: 2021-11-08 | Discharge: 2021-11-08 | Disposition: A | Discharge status: Home / Self Care | Payer: Medicare Other | Source: Ambulatory Visit | Attending: Radiation Oncology | Admitting: Radiation Oncology

## 2021-11-08 ENCOUNTER — Other Ambulatory Visit: Payer: Self-pay

## 2021-11-08 DIAGNOSIS — Z510 Encounter for antineoplastic radiation therapy: Secondary | ICD-10-CM | POA: Insufficient documentation

## 2021-11-08 DIAGNOSIS — Z170 Estrogen receptor positive status [ER+]: Secondary | ICD-10-CM | POA: Insufficient documentation

## 2021-11-08 DIAGNOSIS — C50412 Malignant neoplasm of upper-outer quadrant of left female breast: Secondary | ICD-10-CM | POA: Insufficient documentation

## 2021-11-08 DIAGNOSIS — Z79811 Long term (current) use of aromatase inhibitors: Secondary | ICD-10-CM | POA: Diagnosis not present

## 2021-11-08 LAB — RAD ONC ARIA SESSION SUMMARY
Course Elapsed Days: 1
Plan Fractions Treated to Date: 2
Plan Prescribed Dose Per Fraction: 2.67 Gy
Plan Total Fractions Prescribed: 15
Plan Total Prescribed Dose: 40.05 Gy
Reference Point Dosage Given to Date: 5.34 Gy
Reference Point Session Dosage Given: 2.67 Gy
Session Number: 2

## 2021-11-09 ENCOUNTER — Other Ambulatory Visit: Payer: Self-pay

## 2021-11-09 ENCOUNTER — Ambulatory Visit
Admission: RE | Admit: 2021-11-09 | Discharge: 2021-11-09 | Disposition: A | Discharge status: Home / Self Care | Payer: Medicare Other | Source: Ambulatory Visit | Attending: Radiation Oncology | Admitting: Radiation Oncology

## 2021-11-09 DIAGNOSIS — Z79811 Long term (current) use of aromatase inhibitors: Secondary | ICD-10-CM | POA: Diagnosis not present

## 2021-11-09 DIAGNOSIS — Z170 Estrogen receptor positive status [ER+]: Secondary | ICD-10-CM | POA: Diagnosis not present

## 2021-11-09 DIAGNOSIS — Z510 Encounter for antineoplastic radiation therapy: Secondary | ICD-10-CM | POA: Diagnosis not present

## 2021-11-09 DIAGNOSIS — C50412 Malignant neoplasm of upper-outer quadrant of left female breast: Secondary | ICD-10-CM | POA: Diagnosis not present

## 2021-11-09 LAB — RAD ONC ARIA SESSION SUMMARY
Course Elapsed Days: 2
Plan Fractions Treated to Date: 3
Plan Prescribed Dose Per Fraction: 2.67 Gy
Plan Total Fractions Prescribed: 15
Plan Total Prescribed Dose: 40.05 Gy
Reference Point Dosage Given to Date: 8.01 Gy
Reference Point Session Dosage Given: 2.67 Gy
Session Number: 3

## 2021-11-10 ENCOUNTER — Other Ambulatory Visit: Payer: Self-pay

## 2021-11-10 ENCOUNTER — Ambulatory Visit
Admission: RE | Admit: 2021-11-10 | Discharge: 2021-11-10 | Disposition: A | Discharge status: Home / Self Care | Payer: Medicare Other | Source: Ambulatory Visit | Attending: Radiation Oncology | Admitting: Radiation Oncology

## 2021-11-10 DIAGNOSIS — Z510 Encounter for antineoplastic radiation therapy: Secondary | ICD-10-CM | POA: Diagnosis not present

## 2021-11-10 DIAGNOSIS — C50412 Malignant neoplasm of upper-outer quadrant of left female breast: Secondary | ICD-10-CM | POA: Diagnosis not present

## 2021-11-10 DIAGNOSIS — Z79811 Long term (current) use of aromatase inhibitors: Secondary | ICD-10-CM | POA: Diagnosis not present

## 2021-11-10 DIAGNOSIS — Z170 Estrogen receptor positive status [ER+]: Secondary | ICD-10-CM | POA: Diagnosis not present

## 2021-11-10 LAB — RAD ONC ARIA SESSION SUMMARY
Course Elapsed Days: 3
Plan Fractions Treated to Date: 4
Plan Prescribed Dose Per Fraction: 2.67 Gy
Plan Total Fractions Prescribed: 15
Plan Total Prescribed Dose: 40.05 Gy
Reference Point Dosage Given to Date: 10.68 Gy
Reference Point Session Dosage Given: 2.67 Gy
Session Number: 4

## 2021-11-11 ENCOUNTER — Other Ambulatory Visit: Payer: Self-pay

## 2021-11-11 ENCOUNTER — Ambulatory Visit
Admission: RE | Admit: 2021-11-11 | Discharge: 2021-11-11 | Disposition: A | Discharge status: Home / Self Care | Payer: Medicare Other | Source: Ambulatory Visit | Attending: Radiation Oncology | Admitting: Radiation Oncology

## 2021-11-11 DIAGNOSIS — Z170 Estrogen receptor positive status [ER+]: Secondary | ICD-10-CM | POA: Diagnosis not present

## 2021-11-11 DIAGNOSIS — C50412 Malignant neoplasm of upper-outer quadrant of left female breast: Secondary | ICD-10-CM | POA: Diagnosis not present

## 2021-11-11 DIAGNOSIS — Z510 Encounter for antineoplastic radiation therapy: Secondary | ICD-10-CM | POA: Diagnosis not present

## 2021-11-11 DIAGNOSIS — Z79811 Long term (current) use of aromatase inhibitors: Secondary | ICD-10-CM | POA: Diagnosis not present

## 2021-11-11 LAB — RAD ONC ARIA SESSION SUMMARY
Course Elapsed Days: 4
Plan Fractions Treated to Date: 5
Plan Prescribed Dose Per Fraction: 2.67 Gy
Plan Total Fractions Prescribed: 15
Plan Total Prescribed Dose: 40.05 Gy
Reference Point Dosage Given to Date: 13.35 Gy
Reference Point Session Dosage Given: 2.67 Gy
Session Number: 5

## 2021-11-11 NOTE — Progress Notes (Signed)
Patient Care Team: Wenda Low, MD as PCP - General (Internal Medicine) [name], MD as Consulting Physician (Gynecology) Mauro Kaufmann, RN as Oncology Nurse Navigator Rockwell Germany, RN as Oncology Nurse Navigator Nicholas Lose, MD as Consulting Physician (Hematology and Oncology)  DIAGNOSIS:  Encounter Diagnosis  Name Primary?   Malignant neoplasm of upper-outer quadrant of left breast in female, estrogen receptor positive ([place])     SUMMARY OF ONCOLOGIC HISTORY: Oncology History  Malignant neoplasm of upper-outer quadrant of left breast in female, estrogen receptor positive ([place])  08/02/2021 Initial Diagnosis   Screening mammogram detected left breast distortion measuring 1.1 cm by ultrasound, negative axillary lymph nodes.  Biopsy revealed grade 2 ILC ER 100%, PR 95%, Ki-67 5%, HER2 IHC 2+, negative ratio 1.29   08/29/2021 Cancer Staging   Staging form: Breast, AJCC 8th Edition - Clinical stage from 08/29/2021: Stage IA (cT1c, cN0, cM0, G2, ER+, PR+, HER2-) - Signed by Nicholas Lose, MD on 08/29/2021 Stage prefix: Initial diagnosis Histologic grading system: 3 grade system     CHIEF COMPLIANT: Follow-up breast cancer after radiation  INTERVAL HISTORY: Rachel Villarreal is a 76 y.o. female is here because of recent diagnosis of left breast cancer. She presents to the clinic today for a follow-up. She states that she did well after radiation. Denise soreness.    ALLERGIES:  is allergic to ace inhibitors, lactose intolerance (gi), lisinopril, mercury, and tape.  MEDICATIONS:  Current Outpatient Medications  Medication Sig Dispense Refill   [START ON 12/09/2021] letrozole (FEMARA) 2.5 MG tablet Take 1 tablet (2.5 mg total) by mouth daily. 90 tablet 3   acetaminophen (TYLENOL) 325 MG tablet Take 650 mg by mouth every 6 (six) hours as needed for moderate pain.     amLODipine (NORVASC) 2.5 MG tablet Take 7.5 mg by mouth daily.     atorvastatin (LIPITOR) 10 MG tablet  Take 10 mg by mouth daily.     budesonide (RHINOCORT AQUA) 32 MCG/ACT nasal spray Place 2 sprays into both nostrils daily.     Cholecalciferol (VITAMIN D3) 2000 units TABS Take 2,000 mg by mouth daily.     Coenzyme Q10 (CO Q-10) 200 MG CAPS Take 200 mg by mouth daily.     diclofenac Sodium (VOLTAREN) 1 % GEL Apply 2 g topically 4 (four) times daily as needed (leg pain).     famotidine (PEPCID) 20 MG tablet Take 20 mg by mouth every evening.     febuxostat (ULORIC) 40 MG tablet Take 40 mg by mouth daily.     hydrochlorothiazide (HYDRODIURIL) 12.5 MG tablet Take 12.5 mg by mouth daily.     loratadine (CLARITIN) 10 MG tablet Take 10 mg by mouth 2 (two) times daily.      losartan (COZAAR) 100 MG tablet Take 100 mg by mouth daily.     melatonin 5 MG TABS Take 1 tablet (5 mg total) by mouth at bedtime as needed.  0   pantoprazole (PROTONIX) 40 MG tablet Take 40 mg by mouth daily.     PROAIR HFA 108 (90 BASE) MCG/ACT inhaler Inhale 2 puffs into the lungs every 6 (six) hours as needed (for respiratory issues.).      No current facility-administered medications for this visit.    PHYSICAL EXAMINATION: ECOG PERFORMANCE STATUS: 1 - Symptomatic but completely ambulatory  Vitals:   11/22/21 1447  BP: 139/69  Pulse: (!) 105  Resp: 18  Temp: 98.2 F (36.8 C)  SpO2: 100%   Filed  Weights   11/22/21 1447  Weight: 164 lb 4.8 oz (74.5 kg)     LABORATORY DATA:  I have reviewed the data as listed    Latest Ref Rng & Units 10/11/2021    9:36 AM 09/29/2021    6:51 AM 09/07/2021   12:22 PM  CMP  Glucose 70 - 99 mg/dL 104  96  100   BUN 8 - 23 mg/dL 52  59  57   Creatinine 0.44 - 1.00 mg/dL 3.50  3.38  3.72   Sodium 135 - 145 mmol/L 140  141  138   Potassium 3.5 - 5.1 mmol/L 4.7  4.3  4.3   Chloride 98 - 111 mmol/L 107  113  112   CO2 22 - 32 mmol/L _0 Calcium 8.9 - 10.3 mg/dL 9.7  9.5  9.5   Total Protein 6.5 - 8.1 g/dL 7.2     Total Bilirubin 0.3 - 1.2 mg/dL 0.5     Alkaline Phos 38  - 126 U/L 60     AST 15 - 41 U/L 12     ALT 0 - 44 U/L 7       Lab Results  Component Value Date   WBC 8.7 10/11/2021   HGB 10.5 (L) 10/11/2021   HCT 32.4 (L) 10/11/2021   MCV 88.0 10/11/2021   PLT 302 10/11/2021   NEUTROABS 6.3 10/11/2021    ASSESSMENT & PLAN:  Malignant neoplasm of upper-outer quadrant of left breast in female, estrogen receptor positive ([facility]) Left Lumpectomy: 1.1 cm Grade 1 ILC , Positive Posterior margin, ER 100%, PR 95%, Ki-67 5%, HER2 IHC 2+, negative ratio 1.29   She is a retired Software engineer and loves to travel.. Margin excision 09/29/2021: Benign  Plan: 1. Adjuvant radiation therapy completed 11/25/2021 2. Adjuvant antiestrogen therapy with Letrozole to start 12/09/2021 She took of her estrogen patch off, hot flashes came back (she was using it for incontinence)   RTC in 3 months for survivorship care plan visit    No orders of the defined types were placed in this encounter.  The patient has a good understanding of the overall plan. she agrees with it. she will call with any problems that may develop before the next visit here. Total time spent: 30 mins including face to face time and time spent for planning, charting and co-ordination of care   Harriette Ohara, MD 11/22/21    I Gardiner Coins am scribing for Dr. Lindi Adie  I have reviewed the above documentation for accuracy and completeness, and I agree with the above.

## 2021-11-14 ENCOUNTER — Other Ambulatory Visit: Payer: Self-pay

## 2021-11-14 ENCOUNTER — Ambulatory Visit
Admission: RE | Admit: 2021-11-14 | Discharge: 2021-11-14 | Disposition: A | Discharge status: Home / Self Care | Payer: Medicare Other | Source: Ambulatory Visit | Attending: Radiation Oncology | Admitting: Radiation Oncology

## 2021-11-14 DIAGNOSIS — Z170 Estrogen receptor positive status [ER+]: Secondary | ICD-10-CM | POA: Diagnosis not present

## 2021-11-14 DIAGNOSIS — Z79811 Long term (current) use of aromatase inhibitors: Secondary | ICD-10-CM | POA: Diagnosis not present

## 2021-11-14 DIAGNOSIS — C50412 Malignant neoplasm of upper-outer quadrant of left female breast: Secondary | ICD-10-CM | POA: Diagnosis not present

## 2021-11-14 DIAGNOSIS — Z510 Encounter for antineoplastic radiation therapy: Secondary | ICD-10-CM | POA: Diagnosis not present

## 2021-11-14 LAB — RAD ONC ARIA SESSION SUMMARY
Course Elapsed Days: 7
Plan Fractions Treated to Date: 6
Plan Prescribed Dose Per Fraction: 2.67 Gy
Plan Total Fractions Prescribed: 15
Plan Total Prescribed Dose: 40.05 Gy
Reference Point Dosage Given to Date: 16.02 Gy
Reference Point Session Dosage Given: 2.67 Gy
Session Number: 6

## 2021-11-15 ENCOUNTER — Ambulatory Visit: Payer: Medicare Other

## 2021-11-15 ENCOUNTER — Other Ambulatory Visit: Payer: Self-pay

## 2021-11-15 ENCOUNTER — Ambulatory Visit
Admission: RE | Admit: 2021-11-15 | Discharge: 2021-11-15 | Disposition: A | Discharge status: Home / Self Care | Payer: Medicare Other | Source: Ambulatory Visit | Attending: Radiation Oncology | Admitting: Radiation Oncology

## 2021-11-15 DIAGNOSIS — Z170 Estrogen receptor positive status [ER+]: Secondary | ICD-10-CM | POA: Diagnosis not present

## 2021-11-15 DIAGNOSIS — Z510 Encounter for antineoplastic radiation therapy: Secondary | ICD-10-CM | POA: Diagnosis not present

## 2021-11-15 DIAGNOSIS — Z79811 Long term (current) use of aromatase inhibitors: Secondary | ICD-10-CM | POA: Diagnosis not present

## 2021-11-15 DIAGNOSIS — C50412 Malignant neoplasm of upper-outer quadrant of left female breast: Secondary | ICD-10-CM | POA: Diagnosis not present

## 2021-11-15 LAB — RAD ONC ARIA SESSION SUMMARY
Course Elapsed Days: 8
Plan Fractions Treated to Date: 7
Plan Prescribed Dose Per Fraction: 2.67 Gy
Plan Total Fractions Prescribed: 15
Plan Total Prescribed Dose: 40.05 Gy
Reference Point Dosage Given to Date: 18.69 Gy
Reference Point Session Dosage Given: 2.67 Gy
Session Number: 7

## 2021-11-15 MED ORDER — RADIAPLEXRX EX GEL: Freq: Once | CUTANEOUS | Status: AC

## 2021-11-16 ENCOUNTER — Other Ambulatory Visit: Payer: Self-pay

## 2021-11-16 ENCOUNTER — Ambulatory Visit
Admission: RE | Admit: 2021-11-16 | Discharge: 2021-11-16 | Disposition: A | Discharge status: Home / Self Care | Payer: Medicare Other | Source: Ambulatory Visit | Attending: Radiation Oncology | Admitting: Radiation Oncology

## 2021-11-16 DIAGNOSIS — C50412 Malignant neoplasm of upper-outer quadrant of left female breast: Secondary | ICD-10-CM | POA: Diagnosis not present

## 2021-11-16 DIAGNOSIS — Z170 Estrogen receptor positive status [ER+]: Secondary | ICD-10-CM | POA: Diagnosis not present

## 2021-11-16 DIAGNOSIS — Z79811 Long term (current) use of aromatase inhibitors: Secondary | ICD-10-CM | POA: Diagnosis not present

## 2021-11-16 DIAGNOSIS — Z510 Encounter for antineoplastic radiation therapy: Secondary | ICD-10-CM | POA: Diagnosis not present

## 2021-11-16 LAB — RAD ONC ARIA SESSION SUMMARY
Course Elapsed Days: 9
Plan Fractions Treated to Date: 8
Plan Prescribed Dose Per Fraction: 2.67 Gy
Plan Total Fractions Prescribed: 15
Plan Total Prescribed Dose: 40.05 Gy
Reference Point Dosage Given to Date: 21.36 Gy
Reference Point Session Dosage Given: 2.67 Gy
Session Number: 8

## 2021-11-17 ENCOUNTER — Other Ambulatory Visit: Payer: Self-pay

## 2021-11-17 ENCOUNTER — Ambulatory Visit
Admission: RE | Admit: 2021-11-17 | Discharge: 2021-11-17 | Disposition: A | Discharge status: Home / Self Care | Payer: Medicare Other | Source: Ambulatory Visit | Attending: Radiation Oncology | Admitting: Radiation Oncology

## 2021-11-17 DIAGNOSIS — C50412 Malignant neoplasm of upper-outer quadrant of left female breast: Secondary | ICD-10-CM | POA: Diagnosis not present

## 2021-11-17 DIAGNOSIS — Z79811 Long term (current) use of aromatase inhibitors: Secondary | ICD-10-CM | POA: Diagnosis not present

## 2021-11-17 DIAGNOSIS — Z170 Estrogen receptor positive status [ER+]: Secondary | ICD-10-CM | POA: Diagnosis not present

## 2021-11-17 DIAGNOSIS — Z510 Encounter for antineoplastic radiation therapy: Secondary | ICD-10-CM | POA: Diagnosis not present

## 2021-11-17 LAB — RAD ONC ARIA SESSION SUMMARY
Course Elapsed Days: 10
Plan Fractions Treated to Date: 9
Plan Prescribed Dose Per Fraction: 2.67 Gy
Plan Total Fractions Prescribed: 15
Plan Total Prescribed Dose: 40.05 Gy
Reference Point Dosage Given to Date: 24.03 Gy
Reference Point Session Dosage Given: 2.67 Gy
Session Number: 9

## 2021-11-18 ENCOUNTER — Other Ambulatory Visit: Payer: Self-pay

## 2021-11-18 ENCOUNTER — Ambulatory Visit
Admission: RE | Admit: 2021-11-18 | Discharge: 2021-11-18 | Disposition: A | Discharge status: Home / Self Care | Payer: Medicare Other | Source: Ambulatory Visit | Attending: Radiation Oncology | Admitting: Radiation Oncology

## 2021-11-18 DIAGNOSIS — C50412 Malignant neoplasm of upper-outer quadrant of left female breast: Secondary | ICD-10-CM | POA: Diagnosis not present

## 2021-11-18 DIAGNOSIS — Z170 Estrogen receptor positive status [ER+]: Secondary | ICD-10-CM | POA: Diagnosis not present

## 2021-11-18 DIAGNOSIS — Z510 Encounter for antineoplastic radiation therapy: Secondary | ICD-10-CM | POA: Diagnosis not present

## 2021-11-18 DIAGNOSIS — Z79811 Long term (current) use of aromatase inhibitors: Secondary | ICD-10-CM | POA: Diagnosis not present

## 2021-11-18 LAB — RAD ONC ARIA SESSION SUMMARY
Course Elapsed Days: 11
Plan Fractions Treated to Date: 10
Plan Prescribed Dose Per Fraction: 2.67 Gy
Plan Total Fractions Prescribed: 15
Plan Total Prescribed Dose: 40.05 Gy
Reference Point Dosage Given to Date: 26.7 Gy
Reference Point Session Dosage Given: 2.67 Gy
Session Number: 10

## 2021-11-21 ENCOUNTER — Ambulatory Visit: Payer: Medicare Other

## 2021-11-21 ENCOUNTER — Other Ambulatory Visit: Payer: Self-pay

## 2021-11-21 ENCOUNTER — Ambulatory Visit
Admission: RE | Admit: 2021-11-21 | Discharge: 2021-11-21 | Disposition: A | Discharge status: Home / Self Care | Payer: Medicare Other | Source: Ambulatory Visit | Attending: Radiation Oncology | Admitting: Radiation Oncology

## 2021-11-21 DIAGNOSIS — C50412 Malignant neoplasm of upper-outer quadrant of left female breast: Secondary | ICD-10-CM | POA: Diagnosis not present

## 2021-11-21 DIAGNOSIS — Z79811 Long term (current) use of aromatase inhibitors: Secondary | ICD-10-CM | POA: Diagnosis not present

## 2021-11-21 DIAGNOSIS — Z170 Estrogen receptor positive status [ER+]: Secondary | ICD-10-CM | POA: Diagnosis not present

## 2021-11-21 DIAGNOSIS — Z510 Encounter for antineoplastic radiation therapy: Secondary | ICD-10-CM | POA: Diagnosis not present

## 2021-11-21 LAB — RAD ONC ARIA SESSION SUMMARY
Course Elapsed Days: 14
Plan Fractions Treated to Date: 11
Plan Prescribed Dose Per Fraction: 2.67 Gy
Plan Total Fractions Prescribed: 15
Plan Total Prescribed Dose: 40.05 Gy
Reference Point Dosage Given to Date: 29.37 Gy
Reference Point Session Dosage Given: 2.67 Gy
Session Number: 11

## 2021-11-21 MED ORDER — RADIAPLEXRX EX GEL: Freq: Once | CUTANEOUS | Status: AC

## 2021-11-22 ENCOUNTER — Other Ambulatory Visit: Payer: Self-pay

## 2021-11-22 ENCOUNTER — Inpatient Hospital Stay: Payer: Medicare Other | Attending: Hematology and Oncology | Admitting: Hematology and Oncology

## 2021-11-22 ENCOUNTER — Ambulatory Visit
Admission: RE | Admit: 2021-11-22 | Discharge: 2021-11-22 | Disposition: A | Discharge status: Home / Self Care | Payer: Medicare Other | Source: Ambulatory Visit | Attending: Radiation Oncology | Admitting: Radiation Oncology

## 2021-11-22 DIAGNOSIS — C50412 Malignant neoplasm of upper-outer quadrant of left female breast: Secondary | ICD-10-CM

## 2021-11-22 DIAGNOSIS — Z170 Estrogen receptor positive status [ER+]: Secondary | ICD-10-CM | POA: Diagnosis not present

## 2021-11-22 DIAGNOSIS — Z510 Encounter for antineoplastic radiation therapy: Secondary | ICD-10-CM | POA: Diagnosis not present

## 2021-11-22 DIAGNOSIS — Z79811 Long term (current) use of aromatase inhibitors: Secondary | ICD-10-CM | POA: Diagnosis not present

## 2021-11-22 LAB — RAD ONC ARIA SESSION SUMMARY
Course Elapsed Days: 15
Plan Fractions Treated to Date: 12
Plan Prescribed Dose Per Fraction: 2.67 Gy
Plan Total Fractions Prescribed: 15
Plan Total Prescribed Dose: 40.05 Gy
Reference Point Dosage Given to Date: 32.04 Gy
Reference Point Session Dosage Given: 2.67 Gy
Session Number: 12

## 2021-11-22 MED ORDER — LETROZOLE 2.5 MG PO TABS
2.5000 mg | ORAL_TABLET | Freq: Every day | ORAL | 3 refills | Status: DC
Start: 2021-12-09 — End: 2022-09-07

## 2021-11-22 NOTE — Assessment & Plan Note (Addendum)
Left Lumpectomy: 1.1 cm Grade 1 ILC , Positive Posterior margin, ER 100%, PR 95%, Ki-67 5%, HER2 IHC 2+, negative ratio 1.29  She is a retired Software engineer and loves to travel.. Margin excision 09/29/2021: Benign  Plan: 1. Adjuvant radiation therapy completed 11/25/2021 2. Adjuvant antiestrogen therapy with Letrozole to start 12/09/2021 She took of her estrogen patch off, hot flashes came back (she was using it for incontinence)  RTC in 3 months for survivorship care plan visit

## 2021-11-23 ENCOUNTER — Ambulatory Visit
Admission: RE | Admit: 2021-11-23 | Discharge: 2021-11-23 | Disposition: A | Discharge status: Home / Self Care | Payer: Medicare Other | Source: Ambulatory Visit | Attending: Radiation Oncology | Admitting: Radiation Oncology

## 2021-11-23 ENCOUNTER — Other Ambulatory Visit: Payer: Self-pay | Admitting: Internal Medicine

## 2021-11-23 ENCOUNTER — Other Ambulatory Visit: Payer: Self-pay

## 2021-11-23 DIAGNOSIS — M109 Gout, unspecified: Secondary | ICD-10-CM | POA: Diagnosis not present

## 2021-11-23 DIAGNOSIS — E7800 Pure hypercholesterolemia, unspecified: Secondary | ICD-10-CM | POA: Diagnosis not present

## 2021-11-23 DIAGNOSIS — I120 Hypertensive chronic kidney disease with stage 5 chronic kidney disease or end stage renal disease: Secondary | ICD-10-CM | POA: Diagnosis not present

## 2021-11-23 DIAGNOSIS — N184 Chronic kidney disease, stage 4 (severe): Secondary | ICD-10-CM | POA: Diagnosis not present

## 2021-11-23 DIAGNOSIS — Z170 Estrogen receptor positive status [ER+]: Secondary | ICD-10-CM | POA: Diagnosis not present

## 2021-11-23 DIAGNOSIS — N185 Chronic kidney disease, stage 5: Secondary | ICD-10-CM

## 2021-11-23 DIAGNOSIS — D631 Anemia in chronic kidney disease: Secondary | ICD-10-CM | POA: Diagnosis not present

## 2021-11-23 DIAGNOSIS — Z510 Encounter for antineoplastic radiation therapy: Secondary | ICD-10-CM | POA: Diagnosis not present

## 2021-11-23 DIAGNOSIS — C50412 Malignant neoplasm of upper-outer quadrant of left female breast: Secondary | ICD-10-CM | POA: Diagnosis not present

## 2021-11-23 DIAGNOSIS — Z79811 Long term (current) use of aromatase inhibitors: Secondary | ICD-10-CM | POA: Diagnosis not present

## 2021-11-23 LAB — RAD ONC ARIA SESSION SUMMARY
Course Elapsed Days: 16
Plan Fractions Treated to Date: 13
Plan Prescribed Dose Per Fraction: 2.67 Gy
Plan Total Fractions Prescribed: 15
Plan Total Prescribed Dose: 40.05 Gy
Reference Point Dosage Given to Date: 34.71 Gy
Reference Point Session Dosage Given: 2.67 Gy
Session Number: 13

## 2021-11-24 ENCOUNTER — Other Ambulatory Visit: Payer: Self-pay

## 2021-11-24 ENCOUNTER — Encounter: Payer: Self-pay | Admitting: *Deleted

## 2021-11-24 ENCOUNTER — Ambulatory Visit
Admission: RE | Admit: 2021-11-24 | Discharge: 2021-11-24 | Disposition: A | Discharge status: Home / Self Care | Payer: Medicare Other | Source: Ambulatory Visit | Attending: Radiation Oncology | Admitting: Radiation Oncology

## 2021-11-24 DIAGNOSIS — C50412 Malignant neoplasm of upper-outer quadrant of left female breast: Secondary | ICD-10-CM

## 2021-11-24 DIAGNOSIS — Z510 Encounter for antineoplastic radiation therapy: Secondary | ICD-10-CM | POA: Diagnosis not present

## 2021-11-24 DIAGNOSIS — Z79811 Long term (current) use of aromatase inhibitors: Secondary | ICD-10-CM | POA: Diagnosis not present

## 2021-11-24 DIAGNOSIS — Z170 Estrogen receptor positive status [ER+]: Secondary | ICD-10-CM | POA: Diagnosis not present

## 2021-11-24 LAB — RAD ONC ARIA SESSION SUMMARY
Course Elapsed Days: 17
Plan Fractions Treated to Date: 14
Plan Prescribed Dose Per Fraction: 2.67 Gy
Plan Total Fractions Prescribed: 15
Plan Total Prescribed Dose: 40.05 Gy
Reference Point Dosage Given to Date: 37.38 Gy
Reference Point Session Dosage Given: 2.67 Gy
Session Number: 14

## 2021-11-25 ENCOUNTER — Other Ambulatory Visit: Payer: Self-pay

## 2021-11-25 ENCOUNTER — Ambulatory Visit
Admission: RE | Admit: 2021-11-25 | Discharge: 2021-11-25 | Disposition: A | Discharge status: Home / Self Care | Payer: Medicare Other | Source: Ambulatory Visit | Attending: Radiation Oncology | Admitting: Radiation Oncology

## 2021-11-25 ENCOUNTER — Encounter: Payer: Self-pay | Admitting: Radiation Oncology

## 2021-11-25 DIAGNOSIS — Z170 Estrogen receptor positive status [ER+]: Secondary | ICD-10-CM | POA: Diagnosis not present

## 2021-11-25 DIAGNOSIS — Z510 Encounter for antineoplastic radiation therapy: Secondary | ICD-10-CM | POA: Diagnosis not present

## 2021-11-25 DIAGNOSIS — Z79811 Long term (current) use of aromatase inhibitors: Secondary | ICD-10-CM | POA: Diagnosis not present

## 2021-11-25 DIAGNOSIS — C50412 Malignant neoplasm of upper-outer quadrant of left female breast: Secondary | ICD-10-CM | POA: Diagnosis not present

## 2021-11-25 LAB — RAD ONC ARIA SESSION SUMMARY
Course Elapsed Days: 18
Plan Fractions Treated to Date: 15
Plan Prescribed Dose Per Fraction: 2.67 Gy
Plan Total Fractions Prescribed: 15
Plan Total Prescribed Dose: 40.05 Gy
Reference Point Dosage Given to Date: 40.05 Gy
Reference Point Session Dosage Given: 2.67 Gy
Session Number: 15

## 2021-11-27 ENCOUNTER — Other Ambulatory Visit: Payer: Self-pay

## 2021-11-27 ENCOUNTER — Encounter (HOSPITAL_BASED_OUTPATIENT_CLINIC_OR_DEPARTMENT_OTHER): Payer: Self-pay

## 2021-11-27 ENCOUNTER — Emergency Department (HOSPITAL_BASED_OUTPATIENT_CLINIC_OR_DEPARTMENT_OTHER)
Admission: EM | Admit: 2021-11-27 | Discharge: 2021-11-28 | Disposition: A | Discharge status: Home / Self Care | Payer: Medicare Other | Attending: Emergency Medicine | Admitting: Emergency Medicine

## 2021-11-27 DIAGNOSIS — R350 Frequency of micturition: Secondary | ICD-10-CM | POA: Diagnosis not present

## 2021-11-27 DIAGNOSIS — A419 Sepsis, unspecified organism: Secondary | ICD-10-CM

## 2021-11-27 DIAGNOSIS — Z79899 Other long term (current) drug therapy: Secondary | ICD-10-CM | POA: Insufficient documentation

## 2021-11-27 DIAGNOSIS — N189 Chronic kidney disease, unspecified: Secondary | ICD-10-CM | POA: Insufficient documentation

## 2021-11-27 DIAGNOSIS — N12 Tubulo-interstitial nephritis, not specified as acute or chronic: Secondary | ICD-10-CM

## 2021-11-27 DIAGNOSIS — I129 Hypertensive chronic kidney disease with stage 1 through stage 4 chronic kidney disease, or unspecified chronic kidney disease: Secondary | ICD-10-CM | POA: Insufficient documentation

## 2021-11-27 DIAGNOSIS — R102 Pelvic and perineal pain: Secondary | ICD-10-CM | POA: Diagnosis not present

## 2021-11-27 LAB — CBC
HCT: 33.6 % — ABNORMAL LOW (ref 36.0–46.0)
Hemoglobin: 11.5 g/dL — ABNORMAL LOW (ref 12.0–15.0)
MCH: 29.4 pg (ref 26.0–34.0)
MCHC: 34.2 g/dL (ref 30.0–36.0)
MCV: 85.9 fL (ref 80.0–100.0)
Platelets: 257 10*3/uL (ref 150–400)
RBC: 3.91 MIL/uL (ref 3.87–5.11)
RDW: 13.3 % (ref 11.5–15.5)
WBC: 15.6 10*3/uL — ABNORMAL HIGH (ref 4.0–10.5)
nRBC: 0 % (ref 0.0–0.2)

## 2021-11-27 LAB — URINALYSIS, ROUTINE W REFLEX MICROSCOPIC
Bilirubin Urine: NEGATIVE
Glucose, UA: NEGATIVE mg/dL
Ketones, ur: NEGATIVE mg/dL
Nitrite: NEGATIVE
Protein, ur: >300 mg/dL — AB
RBC / HPF: >50 RBC/hpf — ABNORMAL HIGH (ref 0–5)
Specific Gravity, Urine: 1.017 (ref 1.005–1.030)
WBC, UA: >50 WBC/hpf — ABNORMAL HIGH (ref 0–5)
pH: 6 (ref 5.0–8.0)

## 2021-11-27 LAB — BASIC METABOLIC PANEL
Anion gap: 14 (ref 5–15)
BUN: 60 mg/dL — ABNORMAL HIGH (ref 8–23)
CO2: 14 mmol/L — ABNORMAL LOW (ref 22–32)
Calcium: 9.5 mg/dL (ref 8.9–10.3)
Chloride: 111 mmol/L (ref 98–111)
Creatinine, Ser: 3.88 mg/dL — ABNORMAL HIGH (ref 0.44–1.00)
GFR, Estimated: 11 mL/min — ABNORMAL LOW (ref >60)
Glucose, Bld: 137 mg/dL — ABNORMAL HIGH (ref 70–99)
Potassium: 4.4 mmol/L (ref 3.5–5.1)
Sodium: 139 mmol/L (ref 135–145)

## 2021-11-27 MED ORDER — ACETAMINOPHEN 325 MG PO TABS
650.0000 mg | ORAL_TABLET | Freq: Once | ORAL | Status: AC
  Administered 2021-11-27: 650 mg via ORAL
  Filled 2021-11-27: qty 2

## 2021-11-27 MED ORDER — LACTATED RINGERS IV BOLUS: 1000.0000 mL | Freq: Once | INTRAVENOUS | Status: DC

## 2021-11-27 MED ORDER — SODIUM CHLORIDE 0.9 % IV BOLUS
1000.0000 mL | Freq: Once | INTRAVENOUS | Status: AC
  Administered 2021-11-27: 1000 mL via INTRAVENOUS

## 2021-11-27 MED ORDER — SODIUM CHLORIDE 0.9 % IV SOLN
1.0000 g | Freq: Once | INTRAVENOUS | Status: AC
  Administered 2021-11-27: 1 g via INTRAVENOUS
  Filled 2021-11-27: qty 10

## 2021-11-27 MED ORDER — CEFPODOXIME PROXETIL 200 MG PO TABS: 200.0000 mg | ORAL_TABLET | Freq: Two times a day (BID) | ORAL | 0 refills | Status: AC

## 2021-11-27 MED ORDER — LACTATED RINGERS IV SOLN: INTRAVENOUS | Status: DC

## 2021-11-27 NOTE — ED Triage Notes (Signed)
Pt reports pelvic pressure and frequency. H/x chronic kidney disease.

## 2021-11-27 NOTE — Discharge Instructions (Addendum)
Your work-up was concerning for developing pyelonephritis and sepsis from a likely urinary tract infection.  We discussed at length admission for observation, continued IV fluid resuscitation, IV antibiotics versus discharge home.  After discussion, you requested discharge.  If you develop worsening abdominal pain, worsening of your symptoms for any reason, please return to the emergency department for admission.  We will attempt outpatient management with antibiotics.

## 2021-11-27 NOTE — ED Provider Notes (Signed)
[facility] EMERGENCY DEPT Provider Note   CSN: [id number] Arrival date & time: 11/27/21  1913     History {Add pertinent medical, surgical, social history, OB history to HPI:1} Chief Complaint  Patient presents with   Urinary Frequency   Pelvic Pain    Rachel Villarreal is a 76 y.o. female.   Urinary Frequency  Pelvic Pain       Home Medications Prior to Admission medications   Medication Sig Start Date End Date Taking? Authorizing Provider  acetaminophen (TYLENOL) 325 MG tablet Take 650 mg by mouth every 6 (six) hours as needed for moderate pain.    [provider]  amLODipine (NORVASC) 2.5 MG tablet Take 7.5 mg by mouth daily. 07/27/21   [provider]  atorvastatin (LIPITOR) 10 MG tablet Take 10 mg by mouth daily.    [provider]  budesonide (RHINOCORT AQUA) 32 MCG/ACT nasal spray Place 2 sprays into both nostrils daily.    [provider]  Cholecalciferol (VITAMIN D3) 2000 units TABS Take 2,000 mg by mouth daily.    [provider]  Coenzyme Q10 (CO Q-10) 200 MG CAPS Take 200 mg by mouth daily.    [provider]  diclofenac Sodium (VOLTAREN) 1 % GEL Apply 2 g topically 4 (four) times daily as needed (leg pain).    [provider]  famotidine (PEPCID) 20 MG tablet Take 20 mg by mouth every evening.    [provider]  febuxostat (ULORIC) 40 MG tablet Take 40 mg by mouth daily.    [provider]  hydrochlorothiazide (HYDRODIURIL) 12.5 MG tablet Take 12.5 mg by mouth daily. 07/27/21   [provider]  letrozole (FEMARA) 2.5 MG tablet Take 1 tablet (2.5 mg total) by mouth daily. 12/09/21   Nicholas Lose, MD  loratadine (CLARITIN) 10 MG tablet Take 10 mg by mouth 2 (two) times daily.     [provider]  losartan (COZAAR) 100 MG tablet Take 100 mg by mouth daily. 07/27/21   [provider]  melatonin 5 MG TABS Take 1 tablet (5 mg total) by mouth at  bedtime as needed. 08/29/21   Nicholas Lose, MD  pantoprazole (PROTONIX) 40 MG tablet Take 40 mg by mouth daily.    [provider]  PROAIR HFA 108 (90 BASE) MCG/ACT inhaler Inhale 2 puffs into the lungs every 6 (six) hours as needed (for respiratory issues.).  12/03/14   [provider]      Allergies    Ace inhibitors, Lactose intolerance (gi), Lisinopril, Mercury, and Tape    Review of Systems   Review of Systems  Genitourinary:  Positive for frequency and pelvic pain.    Physical Exam Updated Vital Signs BP (!) 191/89 (BP Location: Right Arm)   Pulse (!) 118   Temp 98.4 F (36.9 C) (Oral)   Resp 20   Ht '5\' 2"'$  (1.575 m)   Wt 74.4 kg   SpO2 98%   BMI 30.00 kg/m  Physical Exam  ED Results / Procedures / Treatments   Labs (all labs ordered are listed, but only abnormal results are displayed) Labs Reviewed  URINALYSIS, ROUTINE W REFLEX MICROSCOPIC - Abnormal; Notable for the following components:      Result Value   APPearance CLOUDY (*)    Hgb urine dipstick LARGE (*)    Protein, ur >300 (*)    Leukocytes,Ua LARGE (*)    RBC / HPF >50 (*)    WBC, UA >50 (*)  Bacteria, UA MANY (*)    Non Squamous Epithelial 0-5 (*)    All other components within normal limits  CBC - Abnormal; Notable for the following components:   WBC 15.6 (*)    Hemoglobin 11.5 (*)    HCT 33.6 (*)    All other components within normal limits  BASIC METABOLIC PANEL - Abnormal; Notable for the following components:   CO2 14 (*)    Glucose, Bld 137 (*)    BUN 60 (*)    Creatinine, Ser 3.88 (*)    GFR, Estimated 11 (*)    All other components within normal limits  URINE CULTURE    EKG None  Radiology No results found.  Procedures Procedures  {Document cardiac monitor, telemetry assessment procedure when appropriate:1}  Medications Ordered in ED Medications - No data to display  ED Course/ Medical Decision Making/ A&P                           Medical Decision  Making Amount and/or Complexity of Data Reviewed Labs: ordered.   ***  {Document critical care time when appropriate:1} {Document review of labs and clinical decision tools ie heart score, Chads2Vasc2 etc:1}  {Document your independent review of radiology images, and any outside records:1} {Document your discussion with family members, caretakers, and with consultants:1} {Document social determinants of health affecting pt's care:1} {Document your decision making why or why not admission, treatments were needed:1} Final Clinical Impression(s) / ED Diagnoses Final diagnoses:  None    Rx / DC Orders ED Discharge Orders     None

## 2021-11-27 NOTE — Sepsis Progress Note (Signed)
Elink following for Sepsis Protocol 

## 2021-11-30 LAB — URINE CULTURE: Culture: >=100000 — AB

## 2021-12-01 ENCOUNTER — Telehealth: Payer: Self-pay | Admitting: *Deleted

## 2021-12-01 ENCOUNTER — Ambulatory Visit
Admission: RE | Admit: 2021-12-01 | Discharge: 2021-12-01 | Disposition: A | Discharge status: Home / Self Care | Payer: Medicare Other | Source: Ambulatory Visit | Attending: Internal Medicine | Admitting: Internal Medicine

## 2021-12-01 DIAGNOSIS — N189 Chronic kidney disease, unspecified: Secondary | ICD-10-CM | POA: Diagnosis not present

## 2021-12-01 DIAGNOSIS — N281 Cyst of kidney, acquired: Secondary | ICD-10-CM | POA: Diagnosis not present

## 2021-12-01 DIAGNOSIS — N185 Chronic kidney disease, stage 5: Secondary | ICD-10-CM

## 2021-12-01 NOTE — Telephone Encounter (Signed)
Post ED Visit - Positive Culture Follow-up  Culture report reviewed by antimicrobial stewardship pharmacist: [facility] Team '[]'$  Elenor Quinones, Pharm.D. '[]'$  Heide Guile, Pharm.D., BCPS AQ-ID '[]'$  Parks Neptune, Pharm.D., BCPS '[]'$  Alycia Rossetti, Pharm.D., BCPS '[]'$  [place], Pharm.D., BCPS, AAHIVP '[]'$  Legrand Como, Pharm.D., BCPS, AAHIVP '[]'$  Salome Arnt, PharmD, BCPS '[]'$  Johnnette Gourd, PharmD, BCPS '[]'$  Hughes Better, PharmD, BCPS '[]'$  Leeroy Cha, PharmD '[]'$  Laqueta Linden, PharmD, BCPS '[]'$  Albertina Parr, PharmD  [place] Team '[]'$  Leodis Sias, PharmD '[]'$  Lindell Spar, PharmD '[]'$  Royetta Asal, PharmD '[]'$  Graylin Shiver, Rph '[]'$  Rema Fendt) Glennon Mac, PharmD '[]'$  Arlyn Dunning, PharmD '[]'$  Netta Cedars, PharmD '[]'$  Dia Sitter, PharmD '[]'$  Leone Haven, PharmD '[]'$  Gretta Arab, PharmD '[]'$  Theodis Shove, PharmD '[]'$  Peggyann Juba, PharmD '[]'$  Reuel Boom, PharmD   Positive urine culture Treated with Cefpodoxime Proxetil, organism sensitive to the same and no further patient follow-up is required at this time. Luisa Hart, PharmD  Harlon Flor Talley 12/01/2021, 12:52 PM

## 2021-12-07 DIAGNOSIS — R350 Frequency of micturition: Secondary | ICD-10-CM | POA: Diagnosis not present

## 2021-12-20 NOTE — Progress Notes (Signed)
                                                                                                                                                             Patient Name: Rachel Villarreal MRN: [id number] DOB: [birth year] Referring Physician: Wenda Low (Profile Not Attached) Date of Service: 11/25/2021 [facility], [place]                                                        End Of Treatment Note  Diagnoses: C50.412-Malignant neoplasm of upper-outer quadrant of left female breast  Cancer Staging:  Cancer Staging  Malignant neoplasm of upper-outer quadrant of left breast in female, estrogen receptor positive ([place]) Staging form: Breast, AJCC 8th Edition - Clinical stage from 08/29/2021: Stage IA (cT1c, cN0, cM0, G2, ER+, PR+, HER2-) - Signed by Nicholas Lose, MD on 08/29/2021 Stage prefix: Initial diagnosis Histologic grading system: 3 grade system   Intent: Curative  Radiation Treatment Dates: 11/07/2021 through 11/25/2021 Site Technique Total Dose (Gy) Dose per Fx (Gy) Completed Fx Beam Energies  Breast, Left: Breast_L_axilla 3D 40.05/40.05 2.67 15/15 10XFFF   Narrative: The patient tolerated radiation therapy relatively well.   Plan: The patient will follow-up with radiation oncology in 31mo. -----------------------------------  SEppie Gibson MD

## 2021-12-28 ENCOUNTER — Encounter: Payer: Self-pay | Admitting: Radiation Oncology

## 2021-12-28 ENCOUNTER — Ambulatory Visit
Admission: RE | Admit: 2021-12-28 | Discharge: 2021-12-28 | Disposition: A | Discharge status: Home / Self Care | Payer: Medicare Other | Source: Ambulatory Visit | Attending: Radiation Oncology | Admitting: Radiation Oncology

## 2021-12-28 ENCOUNTER — Other Ambulatory Visit: Payer: Self-pay

## 2021-12-28 VITALS — BP 168/71 | HR 93 | Temp 97.6°F | Resp 20 | Ht 62.0 in | Wt 163.8 lb

## 2021-12-28 DIAGNOSIS — C50412 Malignant neoplasm of upper-outer quadrant of left female breast: Secondary | ICD-10-CM | POA: Insufficient documentation

## 2021-12-28 DIAGNOSIS — Z170 Estrogen receptor positive status [ER+]: Secondary | ICD-10-CM | POA: Insufficient documentation

## 2021-12-28 NOTE — Progress Notes (Signed)
Radiation Oncology         [phone]) [phone] ________________________________  Name: Rachel Villarreal MRN: [id number]  Date: 12/28/2021  DOB: 1945/09/11  Follow-Up Visit Note  Outpatient  CC: Wenda Low, MD  Wenda Low, MD  Diagnosis and Prior Radiotherapy:    ICD-10-CM   1. Malignant neoplasm of upper-outer quadrant of left breast in female, estrogen receptor positive ([place])  C50.412    Z17.0        Intent: Curative  Radiation Treatment Dates: 11/07/2021 through 11/25/2021 Site Technique Total Dose (Gy) Dose per Fx (Gy) Completed Fx Beam Energies  Breast, Left: Breast_L_axilla 3D 40.05/40.05 2.67 15/15 10XFFF    CHIEF COMPLAINT: Here for follow-up and surveillance of breast cancer  Narrative:  The patient returns today for routine follow-up.  Rachel Villarreal presents today for follow-up after completing radiation to her left breast on 11/25/2021  Pain: Denies  Skin: Reports lingering hyperpigmentation to left axilla. Skin is otherwise intact and well healed. She did experience fungal infection under breast from antibiotic treatment for UTI about a week after she completed radiation. Reports this has cleared up and is resolved ROM: Denies any issues or limitations  Lymphedema:  MedOnc F/U: Scheduled for Survivorship Care Plan with Annabelle Harman on 02/24/2022 Other issues of note: Overall reports she is doing well and pleased with her continued progress  Pt reports Yes No Comments  Tamoxifen '[]'$  '[x]'$    Letrozole '[x]'$  '[]'$  Reports night time hot flashes, but otherwise tolerating well  Anastrazole '[]'$  '[x]'$    Mammogram '[x]'$  Date: TBD '[]'$                                  ALLERGIES:  is allergic to ace inhibitors, lactose intolerance (gi), lisinopril, mercury, and tape.  Meds: Current Outpatient Medications  Medication Sig Dispense Refill   acetaminophen (TYLENOL) 325 MG tablet Take 650 mg by mouth every 6 (six) hours as needed for moderate pain.     amLODipine (NORVASC) 2.5 MG  tablet Take 7.5 mg by mouth daily.     atorvastatin (LIPITOR) 10 MG tablet Take 10 mg by mouth daily.     budesonide (RHINOCORT AQUA) 32 MCG/ACT nasal spray Place 2 sprays into both nostrils daily.     Cholecalciferol (VITAMIN D3) 2000 units TABS Take 2,000 mg by mouth daily.     Coenzyme Q10 (CO Q-10) 200 MG CAPS Take 200 mg by mouth daily.     diclofenac Sodium (VOLTAREN) 1 % GEL Apply 2 g topically 4 (four) times daily as needed (leg pain).     famotidine (PEPCID) 20 MG tablet Take 20 mg by mouth every evening.     febuxostat (ULORIC) 40 MG tablet Take 40 mg by mouth daily.     hydrochlorothiazide (HYDRODIURIL) 12.5 MG tablet Take 12.5 mg by mouth daily.     letrozole (FEMARA) 2.5 MG tablet Take 1 tablet (2.5 mg total) by mouth daily. 90 tablet 3   loratadine (CLARITIN) 10 MG tablet Take 10 mg by mouth 2 (two) times daily.      losartan (COZAAR) 100 MG tablet Take 100 mg by mouth daily.     melatonin 5 MG TABS Take 1 tablet (5 mg total) by mouth at bedtime as needed.  0   pantoprazole (PROTONIX) 40 MG tablet Take 40 mg by mouth daily.     PROAIR HFA 108 (90 BASE) MCG/ACT inhaler Inhale 2 puffs into the lungs  every 6 (six) hours as needed (for respiratory issues.).      No current facility-administered medications for this encounter.    Physical Findings: The patient is in no acute distress. Patient is alert and oriented.  height is '5\' 2"'$  (1.575 m) and weight is 163 lb 12.8 oz (74.3 kg). Her temperature is 97.6 F (36.4 C). Her blood pressure is 168/71 (abnormal) and her pulse is 93. Her respiration is 20 and oxygen saturation is 100%. .    Satisfactory skin healing in radiotherapy fields. There is some residual hyperpigmentation. No residual fungal rash. Skin intact.   Lab Findings: Lab Results  Component Value Date   WBC 15.6 (H) 11/27/2021   HGB 11.5 (L) 11/27/2021   HCT 33.6 (L) 11/27/2021   MCV 85.9 11/27/2021   PLT 257 11/27/2021    Radiographic Findings: US  RENAL  Result Date: 12/01/2021 CLINICAL DATA:  Chronic renal disease EXAM: RENAL / URINARY TRACT ULTRASOUND COMPLETE COMPARISON:  None Available. FINDINGS: Right Kidney: Renal measurements: 8.8 x 4.9 x 2.9 cm = volume: 66.3 mL. Contains multiple cysts with the largest measuring 1.6 cm. No follow-up imaging recommended for the cyst. Increased cortical echogenicity. Left Kidney: Renal measurements: 10.6 x 5.6 x 5.3 cm = volume: 165 mL. Contains multiple cysts with the largest measuring 1.9 cm. No follow-up imaging recommended for the cysts. Increased cortical echogenicity. Bladder: Not evaluated due to lack distention. Other: None. IMPRESSION: 1. Medical renal disease with bilateral increased cortical echogenicity associated with the kidneys. 2. The bladder was not assessed due to lack of distention. Electronically Signed   By: Dorise Bullion III M.D.   On: 12/01/2021 18:45    Impression/Plan: Healing well from radiotherapy to the breast tissue.  Continue skin care with topical Vitamin E Oil and / or lotion for at least 2 more months for further healing.  I encouraged her to continue with yearly mammography as appropriate (for intact breast tissue) and followup with medical oncology. I will see her back on an as-needed basis. I have encouraged her to call if she has any issues or concerns in the future. I wished her the very best.  On date of service, in total, I spent 15 minutes on this encounter. Patient was seen in person.  _____________________________________   Eppie Gibson, MD

## 2021-12-28 NOTE — Progress Notes (Signed)
Rachel Villarreal presents today for follow-up after completing radiation to her left breast on 11/25/2021  Pain: Denies  Skin: Reports lingering hyperpigmentation to left axilla. Skin is otherwise intact and well healed. She did experience fungal infection under breast from antibiotic treatment for UTI about a week after she completed radiation. Reports this has cleared up and is resolved ROM: Denies any issues or limitations  Lymphedema:  MedOnc F/U: Scheduled for Survivorship Care Plan with Annabelle Harman on 02/24/2022 Other issues of note: Overall reports she is doing well and pleased with her continued progress  Pt reports Yes No Comments  Tamoxifen '[]'$  '[x]'$    Letrozole '[x]'$  '[]'$  Reports night time hot flashes, but otherwise tolerating well  Anastrazole '[]'$  '[x]'$    Mammogram '[x]'$  Date: TBD '[]'$ 

## 2022-01-20 ENCOUNTER — Ambulatory Visit (HOSPITAL_BASED_OUTPATIENT_CLINIC_OR_DEPARTMENT_OTHER): Payer: Medicare Other | Admitting: Obstetrics & Gynecology

## 2022-01-26 DIAGNOSIS — D631 Anemia in chronic kidney disease: Secondary | ICD-10-CM | POA: Diagnosis not present

## 2022-01-26 DIAGNOSIS — N189 Chronic kidney disease, unspecified: Secondary | ICD-10-CM | POA: Diagnosis not present

## 2022-01-26 DIAGNOSIS — E7800 Pure hypercholesterolemia, unspecified: Secondary | ICD-10-CM | POA: Diagnosis not present

## 2022-01-26 DIAGNOSIS — N185 Chronic kidney disease, stage 5: Secondary | ICD-10-CM | POA: Diagnosis not present

## 2022-01-26 DIAGNOSIS — I120 Hypertensive chronic kidney disease with stage 5 chronic kidney disease or end stage renal disease: Secondary | ICD-10-CM | POA: Diagnosis not present

## 2022-01-26 DIAGNOSIS — M109 Gout, unspecified: Secondary | ICD-10-CM | POA: Diagnosis not present

## 2022-02-06 ENCOUNTER — Ambulatory Visit (HOSPITAL_BASED_OUTPATIENT_CLINIC_OR_DEPARTMENT_OTHER)
Admission: RE | Admit: 2022-02-06 | Discharge: 2022-02-06 | Disposition: A | Discharge status: Home / Self Care | Payer: Medicare Other | Source: Ambulatory Visit | Attending: Internal Medicine | Admitting: Internal Medicine

## 2022-02-06 ENCOUNTER — Other Ambulatory Visit (HOSPITAL_BASED_OUTPATIENT_CLINIC_OR_DEPARTMENT_OTHER): Payer: Self-pay | Admitting: Internal Medicine

## 2022-02-06 DIAGNOSIS — R198 Other specified symptoms and signs involving the digestive system and abdomen: Secondary | ICD-10-CM | POA: Insufficient documentation

## 2022-02-06 DIAGNOSIS — R1032 Left lower quadrant pain: Secondary | ICD-10-CM | POA: Diagnosis not present

## 2022-02-06 DIAGNOSIS — K5730 Diverticulosis of large intestine without perforation or abscess without bleeding: Secondary | ICD-10-CM | POA: Diagnosis not present

## 2022-02-06 DIAGNOSIS — K8020 Calculus of gallbladder without cholecystitis without obstruction: Secondary | ICD-10-CM | POA: Diagnosis not present

## 2022-02-06 DIAGNOSIS — N289 Disorder of kidney and ureter, unspecified: Secondary | ICD-10-CM | POA: Diagnosis not present

## 2022-02-07 ENCOUNTER — Other Ambulatory Visit (HOSPITAL_COMMUNITY): Payer: Self-pay | Admitting: Internal Medicine

## 2022-02-07 ENCOUNTER — Other Ambulatory Visit: Payer: Self-pay | Admitting: Internal Medicine

## 2022-02-07 DIAGNOSIS — M7989 Other specified soft tissue disorders: Secondary | ICD-10-CM

## 2022-02-09 DIAGNOSIS — R1903 Right lower quadrant abdominal swelling, mass and lump: Secondary | ICD-10-CM | POA: Diagnosis not present

## 2022-02-10 ENCOUNTER — Encounter (HOSPITAL_COMMUNITY)
Admission: RE | Admit: 2022-02-10 | Discharge: 2022-02-10 | Disposition: A | Discharge status: Home / Self Care | Payer: Medicare Other | Source: Ambulatory Visit | Attending: Internal Medicine | Admitting: Internal Medicine

## 2022-02-10 DIAGNOSIS — Z853 Personal history of malignant neoplasm of breast: Secondary | ICD-10-CM | POA: Diagnosis not present

## 2022-02-10 DIAGNOSIS — M7989 Other specified soft tissue disorders: Secondary | ICD-10-CM | POA: Insufficient documentation

## 2022-02-10 DIAGNOSIS — K8020 Calculus of gallbladder without cholecystitis without obstruction: Secondary | ICD-10-CM | POA: Diagnosis not present

## 2022-02-10 DIAGNOSIS — R9389 Abnormal findings on diagnostic imaging of other specified body structures: Secondary | ICD-10-CM | POA: Diagnosis not present

## 2022-02-10 LAB — GLUCOSE, CAPILLARY: Glucose-Capillary: 109 mg/dL — ABNORMAL HIGH (ref 70–99)

## 2022-02-10 MED ORDER — FLUDEOXYGLUCOSE F - 18 (FDG) INJECTION
8.0000 | Freq: Once | INTRAVENOUS | Status: AC
  Administered 2022-02-10: 8.14 via INTRAVENOUS

## 2022-02-15 ENCOUNTER — Ambulatory Visit (HOSPITAL_BASED_OUTPATIENT_CLINIC_OR_DEPARTMENT_OTHER): Payer: Medicare Other | Admitting: Obstetrics & Gynecology

## 2022-02-15 NOTE — Progress Notes (Signed)
Patient Care Team: Wenda Low, MD as PCP - General (Internal Medicine) [name], MD as Consulting Physician (Gynecology) Mauro Kaufmann, RN as Oncology Nurse Navigator Rockwell Germany, RN as Oncology Nurse Navigator Nicholas Lose, MD as Consulting Physician (Hematology and Oncology)  DIAGNOSIS:  Encounter Diagnosis  Name Primary?   Malignant neoplasm of upper-outer quadrant of left breast in female, estrogen receptor positive ([place]) Yes    SUMMARY OF ONCOLOGIC HISTORY: Oncology History  Malignant neoplasm of upper-outer quadrant of left breast in female, estrogen receptor positive ([place])  08/02/2021 Initial Diagnosis   Screening mammogram detected left breast distortion measuring 1.1 cm by ultrasound, negative axillary lymph nodes.  Biopsy revealed grade 2 ILC ER 100%, PR 95%, Ki-67 5%, HER2 IHC 2+, negative ratio 1.29   08/29/2021 Cancer Staging   Staging form: Breast, AJCC 8th Edition - Clinical stage from 08/29/2021: Stage IA (cT1c, cN0, cM0, G2, ER+, PR+, HER2-) - Signed by Nicholas Lose, MD on 08/29/2021 Stage prefix: Initial diagnosis Histologic grading system: 3 grade system     CHIEF COMPLIANT: Follow-up to review Pet scans  INTERVAL HISTORY: VONITA CALLOWAY  is a 76 y.o. female is here because of recent diagnosis of left breast cancer. She presents to the clinic today for a follow-up to review Pet scans. She reports a week ago she woke up with a stomach ache. She says it was still hurting the next day so she went in to be seen. She took antibiotics and she said around 2:00 in the morning she didn't have anymore pain. Denies pain in the back or spine.  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  She underwent a PET CT scan and is here today to discuss results.  She feels completely back to normal self.   ALLERGIES:  is allergic to ace inhibitors, lactose intolerance (gi), lisinopril, mercury, and tape.  MEDICATIONS:  Current  Outpatient Medications  Medication Sig Dispense Refill   acetaminophen (TYLENOL) 325 MG tablet Take 650 mg by mouth every 6 (six) hours as needed for moderate pain.     amLODipine (NORVASC) 2.5 MG tablet Take 7.5 mg by mouth daily.     atorvastatin (LIPITOR) 10 MG tablet Take 10 mg by mouth daily.     budesonide (RHINOCORT AQUA) 32 MCG/ACT nasal spray Place 2 sprays into both nostrils daily.     Cholecalciferol (VITAMIN D3) 2000 units TABS Take 2,000 mg by mouth daily.     Coenzyme Q10 (CO Q-10) 200 MG CAPS Take 200 mg by mouth daily.     diclofenac Sodium (VOLTAREN) 1 % GEL Apply 2 g topically 4 (four) times daily as needed (leg pain).     famotidine (PEPCID) 20 MG tablet Take 20 mg by mouth every evening.     febuxostat (ULORIC) 40 MG tablet Take 40 mg by mouth daily.     hydrochlorothiazide (HYDRODIURIL) 12.5 MG tablet Take 12.5 mg by mouth daily.     letrozole (FEMARA) 2.5 MG tablet Take 1 tablet (2.5 mg total) by mouth daily. 90 tablet 3   loratadine (CLARITIN) 10 MG tablet Take 10 mg by mouth 2 (two) times daily.      losartan (COZAAR) 100 MG tablet Take 100 mg by mouth daily.     melatonin 5 MG TABS Take 1 tablet (5 mg total) by mouth at bedtime as needed.  0   pantoprazole (PROTONIX) 40 MG tablet Take 40 mg by mouth daily.  PROAIR HFA 108 (90 BASE) MCG/ACT inhaler Inhale 2 puffs into the lungs every 6 (six) hours as needed (for respiratory issues.).      No current facility-administered medications for this visit.    PHYSICAL EXAMINATION: ECOG PERFORMANCE STATUS: 0 - Asymptomatic  Vitals:   02/16/22 1332  BP: (!) 154/76  Pulse: (!) 107  Resp: 18  Temp: 97.9 F (36.6 C)  SpO2: 98%   Filed Weights   02/16/22 1332  Weight: 161 lb 4.8 oz (73.2 kg)      LABORATORY DATA:  I have reviewed the data as listed    Latest Ref Rng & Units 11/27/2021    7:24 PM 10/11/2021    9:36 AM 09/29/2021    6:51 AM  CMP  Glucose 70 - 99 mg/dL 137  104  96   BUN 8 - 23 mg/dL 60  52  59    Creatinine 0.44 - 1.00 mg/dL 3.88  3.50  3.38   Sodium 135 - 145 mmol/L 139  140  141   Potassium 3.5 - 5.1 mmol/L 4.4  4.7  4.3   Chloride 98 - 111 mmol/L 111  107  113   CO2 22 - 32 mmol/L _0 Calcium 8.9 - 10.3 mg/dL 9.5  9.7  9.5   Total Protein 6.5 - 8.1 g/dL  7.2    Total Bilirubin 0.3 - 1.2 mg/dL  0.5    Alkaline Phos 38 - 126 U/L  60    AST 15 - 41 U/L  12    ALT 0 - 44 U/L  7      Lab Results  Component Value Date   WBC 15.6 (H) 11/27/2021   HGB 11.5 (L) 11/27/2021   HCT 33.6 (L) 11/27/2021   MCV 85.9 11/27/2021   PLT 257 11/27/2021   NEUTROABS 6.3 10/11/2021    ASSESSMENT & PLAN:  Malignant neoplasm of upper-outer quadrant of left breast in female, estrogen receptor positive (HCC) Left Lumpectomy: 1.1 cm Grade 1 ILC , Positive Posterior margin, ER 100%, PR 95%, Ki-67 5%, HER2 IHC 2+, negative ratio 1.29 Margin excision 09/29/2021: Benign  She is a retired Software engineer and loves to travel. 1. Adjuvant radiation therapy completed 11/25/2021 2. Adjuvant antiestrogen therapy with Letrozole started 12/09/2021 ---------------------------------------------------------------------------------------------------------------------------------------------------------------- Left lower quadrant abdominal pain: CT abdomen 02/07/2022: Right lower quadrant mesenteric mass, 5 mm right lung base nodule PET CT scan 02/10/2022: 2.7 cm mesenteric mass is hypermetabolic consistent with neoplastic process.  Multiple hypermetabolic bone lesions involving the spine most notable at L4, focus of hypermetabolism or vaginal cuff, right atrial appendage  I discussed with interventional radiology about biopsy of the spine.  Unfortunately they do not believe that it would be yield good material.  I requested Dr. Donne Hazel to review her PET CT scan.  He felt that yes it could be done but it would be a significant surgical procedure for this.  Therefore after much discussion and contemplation of  recommendation is to discuss treatment with Verzinio in addition to the letrozole. I will call her and discuss this plan with her.    No orders of the defined types were placed in this encounter.  The patient has a good understanding of the overall plan. she agrees with it. she will call with any problems that may develop before the next visit here. Total time spent: 30 mins including face to face time and time spent for planning, charting and co-ordination of care   [place]  Loyal Gambler, MD 02/17/22    I Gardiner Coins am scribing for Dr. Lindi Adie  I have reviewed the above documentation for accuracy and completeness, and I agree with the above.

## 2022-02-16 ENCOUNTER — Other Ambulatory Visit: Payer: Self-pay

## 2022-02-16 ENCOUNTER — Inpatient Hospital Stay: Payer: Medicare Other | Attending: Hematology and Oncology | Admitting: Hematology and Oncology

## 2022-02-16 VITALS — BP 154/76 | HR 107 | Temp 97.9°F | Resp 18 | Ht 62.0 in | Wt 161.3 lb

## 2022-02-16 DIAGNOSIS — C50412 Malignant neoplasm of upper-outer quadrant of left female breast: Secondary | ICD-10-CM

## 2022-02-16 DIAGNOSIS — Z923 Personal history of irradiation: Secondary | ICD-10-CM | POA: Insufficient documentation

## 2022-02-16 DIAGNOSIS — C7951 Secondary malignant neoplasm of bone: Secondary | ICD-10-CM | POA: Diagnosis not present

## 2022-02-16 DIAGNOSIS — Z79811 Long term (current) use of aromatase inhibitors: Secondary | ICD-10-CM | POA: Diagnosis not present

## 2022-02-16 DIAGNOSIS — Z170 Estrogen receptor positive status [ER+]: Secondary | ICD-10-CM | POA: Diagnosis not present

## 2022-02-16 NOTE — Assessment & Plan Note (Signed)
Left Lumpectomy: 1.1 cm Grade 1 ILC , Positive Posterior margin, ER 100%, PR 95%, Ki-67 5%, HER2 IHC 2+, negative ratio 1.29 Margin excision 09/29/2021: Benign  She is a retired pharmacist and loves to travel. 1. Adjuvant radiation therapy completed 11/25/2021 2. Adjuvant antiestrogen therapy with Letrozole started 12/09/2021 ---------------------------------------------------------------------------------------------------------------------------------------------------------------- Left lower quadrant abdominal pain: CT abdomen 02/07/2022: Right lower quadrant mesenteric mass, 5 mm right lung base nodule PET CT scan 02/10/2022: 2.7 cm mesenteric mass is hypermetabolic consistent with neoplastic process.  Multiple hypermetabolic bone lesions involving the spine most notable at L4, focus of hypermetabolism or vaginal cuff, right atrial appendage  I will discuss with interventional radiology to see if this could be biopsied. 

## 2022-02-17 ENCOUNTER — Telehealth: Payer: Self-pay | Admitting: Pharmacy Technician

## 2022-02-17 ENCOUNTER — Inpatient Hospital Stay (HOSPITAL_BASED_OUTPATIENT_CLINIC_OR_DEPARTMENT_OTHER): Payer: Medicare Other | Admitting: Hematology and Oncology

## 2022-02-17 ENCOUNTER — Other Ambulatory Visit (HOSPITAL_COMMUNITY): Payer: Self-pay

## 2022-02-17 DIAGNOSIS — Z170 Estrogen receptor positive status [ER+]: Secondary | ICD-10-CM

## 2022-02-17 DIAGNOSIS — C50412 Malignant neoplasm of upper-outer quadrant of left female breast: Secondary | ICD-10-CM

## 2022-02-17 MED ORDER — ABEMACICLIB 100 MG PO TABS
100.0000 mg | ORAL_TABLET | Freq: Two times a day (BID) | ORAL | 0 refills | Status: DC
  Filled 2022-02-17: qty 70, 35d supply, fill #0

## 2022-02-17 NOTE — Telephone Encounter (Signed)
Oral Oncology Patient Advocate Encounter   Received notification that prior authorization for Verzenio is required.   PA submitted on 02/17/22 Key [id number] Status is pending     Lady Deutscher, CPhT-Adv Oncology Pharmacy Patient [place] Direct Number: [phone]  Fax: [phone]

## 2022-02-17 NOTE — Progress Notes (Signed)
HEMATOLOGY-ONCOLOGY TELEPHONE VISIT PROGRESS NOTE  I connected with our patient on 02/17/22 at 12:15 PM EST by telephone and verified that I am speaking with the correct person using two identifiers.  I discussed the limitations, risks, security and privacy concerns of performing an evaluation and management service by telephone and the availability of in person appointments.  I also discussed with the patient that there may be a patient responsible charge related to this service. The patient expressed understanding and agreed to proceed.   History of Present Illness: Telephone visit to discuss biopsy versus starting treatment  Oncology History  Malignant neoplasm of upper-outer quadrant of left breast in female, estrogen receptor positive ([place])  08/02/2021 Initial Diagnosis   Screening mammogram detected left breast distortion measuring 1.1 cm by ultrasound, negative axillary lymph nodes.  Biopsy revealed grade 2 ILC ER 100%, PR 95%, Ki-67 5%, HER2 IHC 2+, negative ratio 1.29   08/29/2021 Cancer Staging   Staging form: Breast, AJCC 8th Edition - Clinical stage from 08/29/2021: Stage IA (cT1c, cN0, cM0, G2, ER+, PR+, HER2-) - Signed by Nicholas Lose, MD on 08/29/2021 Stage prefix: Initial diagnosis Histologic grading system: 3 grade system     REVIEW OF SYSTEMS:   Constitutional: Denies fevers, chills or abnormal weight loss All other systems were reviewed with the patient and are negative. Observations/Objective:     Assessment Plan:  Malignant neoplasm of upper-outer quadrant of left breast in female, estrogen receptor positive ([facility]) Left Lumpectomy: 1.1 cm Grade 1 ILC , Positive Posterior margin, ER 100%, PR 95%, Ki-67 5%, HER2 IHC 2+, negative ratio 1.29 Margin excision 09/29/2021: Benign  Left lower quadrant abdominal pain: CT abdomen 02/07/2022: Right lower quadrant mesenteric mass, 5 mm right lung base nodule  PET CT scan 02/10/2022: 2.7 cm mesenteric mass is hypermetabolic  consistent with neoplastic process.  Multiple hypermetabolic bone lesions involving the spine most notable at L4, focus of hypermetabolism or vaginal cuff, right atrial appendage -------------------------------------------------------------------- Treatment plan: Recommend adding Verzinio to letrozole  We discussed at length about pursuing a biopsy of either the spine or the mesenteric mass but after much discussion with Dr. Donne Hazel and the intervention radiology we decided not to proceed with any biopsies but to treat her ASAP.  Abemaciclib counseling: I discussed at length the risks and benefits of Abemaciclib in combination with letrozole. Adverse effects of Abemaciclib include decreasing neutrophil count, pneumonia, blood clots in lungs as well as nausea and GI symptoms. Side effects of letrozole include hot flashes, muscle aches and pains, uterine bleeding/spotting/cancer, osteoporosis, risk of blood clots.  Return to clinic in 3 weeks to follow-up with Jenny Reichmann with labs   I discussed the assessment and treatment plan with the patient. The patient was provided an opportunity to ask questions and all were answered. The patient agreed with the plan and demonstrated an understanding of the instructions. The patient was advised to call back or seek an in-person evaluation if the symptoms worsen or if the condition fails to improve as anticipated.   I provided 15 minutes of non-face-to-face time during this encounter.  This includes time for charting and coordination of care   Harriette Ohara, MD

## 2022-02-17 NOTE — Telephone Encounter (Signed)
Oral Oncology Patient Advocate Encounter  Prior Authorization for [facility] has been approved.    PA# [id number] Effective dates: 02/17/22 through 04/10/23  Patients co-pay is $2,815.72.    Lady Deutscher, CPhT-Adv Oncology Pharmacy Patient [place] [facility] Direct Number: [phone]  Fax: [phone]

## 2022-02-17 NOTE — Assessment & Plan Note (Signed)
Left Lumpectomy: 1.1 cm Grade 1 ILC , Positive Posterior margin, ER 100%, PR 95%, Ki-67 5%, HER2 IHC 2+, negative ratio 1.29 Margin excision 09/29/2021: Benign  Left lower quadrant abdominal pain: CT abdomen 02/07/2022: Right lower quadrant mesenteric mass, 5 mm right lung base nodule  PET CT scan 02/10/2022: 2.7 cm mesenteric mass is hypermetabolic consistent with neoplastic process.  Multiple hypermetabolic bone lesions involving the spine most notable at L4, focus of hypermetabolism or vaginal cuff, right atrial appendage -------------------------------------------------------------------- Treatment plan: Recommend adding Verzinio to letrozole  We discussed at length about pursuing a biopsy of either the spine or the mesenteric mass but after much discussion with Dr. Donne Hazel and the intervention radiology we decided not to proceed with any biopsies but to treat her ASAP.  Abemaciclib counseling: I discussed at length the risks and benefits of Abemaciclib in combination with letrozole. Adverse effects of Abemaciclib include decreasing neutrophil count, pneumonia, blood clots in lungs as well as nausea and GI symptoms. Side effects of letrozole include hot flashes, muscle aches and pains, uterine bleeding/spotting/cancer, osteoporosis, risk of blood clots.  Return to clinic in 3 weeks to follow-up with Jenny Reichmann with labs

## 2022-02-17 NOTE — Telephone Encounter (Signed)
Oral Oncology Patient Advocate Encounter   Began application for assistance for Verzenio through [employer].   Application will be submitted upon completion of necessary supporting documentation.   [employer] number [phone].   I will continue to check the status until final determination.   Lady Deutscher, CPhT-Adv Oncology Pharmacy Patient [place] Direct Number: [phone]  Fax: [phone]

## 2022-02-20 ENCOUNTER — Telehealth: Payer: Self-pay | Admitting: Hematology and Oncology

## 2022-02-20 ENCOUNTER — Telehealth: Payer: Self-pay

## 2022-02-20 ENCOUNTER — Other Ambulatory Visit: Payer: Self-pay | Admitting: *Deleted

## 2022-02-20 MED ORDER — ABEMACICLIB 100 MG PO TABS: 100.0000 mg | ORAL_TABLET | Freq: Two times a day (BID) | ORAL | 0 refills | Status: DC

## 2022-02-20 NOTE — Telephone Encounter (Signed)
Scheduled appointment per 11/10 los. Patient is aware. 

## 2022-02-20 NOTE — Telephone Encounter (Signed)
Oral Oncology Pharmacist Encounter  Received new prescription for abemaciclib (Verzenio) for the treatment of HR positive, HER2 negative breast cancer in conjunction with letrozole, planned duration until disease progression or unacceptable toxicity.  Labs from 11/27/21 (CBC and BMP) and CMP on 10/11/21 assessed, no interventions needed. No dose adjustmetns needed for renal function although patient has creatinine of 3.88.  Current medication list in Epic reviewed, no significant DDIs with Verzenio identified.  Evaluated chart and no patient barriers to medication adherence noted.   Patient agreement for treatment documented in MD note on 02/17/22.  Prescription has been e-scribed to the [facility] for benefits analysis and approval.  Oral Oncology Clinic will continue to follow for insurance authorization, copayment issues, initial counseling and start date.  Drema Halon, PharmD Hematology/Oncology Clinical Pharmacist [facility] [phone] 02/20/2022 10:07 AM

## 2022-02-22 NOTE — Telephone Encounter (Signed)
Oral Oncology Patient Advocate Encounter  Received update via fax from [facility] regarding patient assistance application for [employer].  Status is pending proof of income.   [employer] phone [phone]  I will continue to check status until final determination.  Lady Deutscher, CPhT-Adv Oncology Pharmacy Patient [place] Direct Number: [phone]  Fax: [phone]

## 2022-02-23 ENCOUNTER — Other Ambulatory Visit (HOSPITAL_COMMUNITY): Payer: Self-pay

## 2022-02-24 ENCOUNTER — Encounter: Payer: Medicare Other | Admitting: Adult Health

## 2022-02-24 NOTE — Telephone Encounter (Signed)
Oral Oncology Patient Advocate Encounter   Submitted POI to [facility] via e-fax [phone].  Status is pending  Lady Deutscher, CPhT-Adv Oncology Pharmacy Patient [place] Direct Number: [phone]  Fax: [phone]

## 2022-02-27 ENCOUNTER — Other Ambulatory Visit: Payer: Self-pay | Admitting: *Deleted

## 2022-02-27 ENCOUNTER — Other Ambulatory Visit (HOSPITAL_COMMUNITY): Payer: Self-pay

## 2022-02-27 MED ORDER — ABEMACICLIB 100 MG PO TABS
100.0000 mg | ORAL_TABLET | Freq: Two times a day (BID) | ORAL | 0 refills | Status: DC
  Filled 2022-02-27 (×2): qty 56, 28d supply, fill #0

## 2022-02-27 NOTE — Telephone Encounter (Addendum)
Oral Chemotherapy Pharmacist Encounter  I spoke with patient for overview of: Verzenio for the treatment of hormone-receptor positive breast cancer, in combination with letrozole, planned duration until disease progression or unacceptable toxicity.   Counseled patient on administration, dosing, side effects, monitoring, drug-food interactions, safe handling, storage, and disposal.  Patient will take Verzenio '100mg'$  tablets, 1 tablet by mouth twice daily without regard to food.  Patient knows to avoid grapefruit and grapefruit juice.  Patient is taking letrozole once daily.  Verzenio start date: 03/13/22  Adverse effects include but are not limited to: diarrhea, fatigue, nausea, abdominal pain, decreased blood counts, and increased liver function tests, and joint pains. Severe, life-threatening, and/or fatal interstitial lung disease (ILD) and/or pneumonitis may occur with CDK 4/6 inhibitors.  Patient does not have anti-emetic on hand and knows to contact the office if nausea develops.   Patient will obtain anti diarrheal and alert the office of 4 or more loose stools above baseline.  Reviewed with patient importance of keeping a medication schedule and plan for any missed doses. No barriers to medication adherence identified.  Medication reconciliation performed and medication/allergy list updated.  Insurance authorization for [employer] has been obtained. Test claim at the pharmacy revealed copayment $2,815.73 for 1st fill of 28 days. Patient will pick up medication from [facility] on 03/13/22.   Patient informed the pharmacy will reach out 5-7 days prior to needing next fill of Verzenio to coordinate continued medication acquisition to prevent break in therapy.  All questions answered.  Patient voiced understanding and appreciation.   Medication education handout placed in mail for patient. Patient knows to call the office with questions or concerns. Oral  Chemotherapy Clinic phone number provided to patient.   Drema Halon, PharmD Hematology/Oncology Clinical Pharmacist [facility] [phone] 02/27/2022   1:13 PM

## 2022-02-27 NOTE — Telephone Encounter (Signed)
Oral Oncology Patient Advocate Encounter   Received notification that the application for assistance for Verzenio through [employer] has been denied due to patient exceeds income limits set by program.   [employer] phone number [phone].   I have spoken to the patient.  Rachel Villarreal, CPhT-Adv Oncology Pharmacy Patient [place] Direct Number: [phone]  Fax: [phone]

## 2022-02-28 ENCOUNTER — Other Ambulatory Visit (HOSPITAL_BASED_OUTPATIENT_CLINIC_OR_DEPARTMENT_OTHER): Payer: Self-pay | Admitting: Obstetrics & Gynecology

## 2022-02-28 ENCOUNTER — Ambulatory Visit (INDEPENDENT_AMBULATORY_CARE_PROVIDER_SITE_OTHER): Payer: Medicare Other | Admitting: Obstetrics & Gynecology

## 2022-02-28 ENCOUNTER — Encounter (HOSPITAL_BASED_OUTPATIENT_CLINIC_OR_DEPARTMENT_OTHER): Payer: Self-pay | Admitting: Obstetrics & Gynecology

## 2022-02-28 VITALS — BP 156/68 | HR 92 | Ht 62.0 in | Wt 161.6 lb

## 2022-02-28 DIAGNOSIS — M8580 Other specified disorders of bone density and structure, unspecified site: Secondary | ICD-10-CM | POA: Diagnosis not present

## 2022-02-28 DIAGNOSIS — Z9889 Other specified postprocedural states: Secondary | ICD-10-CM

## 2022-02-28 DIAGNOSIS — Z170 Estrogen receptor positive status [ER+]: Secondary | ICD-10-CM

## 2022-02-28 DIAGNOSIS — C50412 Malignant neoplasm of upper-outer quadrant of left female breast: Secondary | ICD-10-CM

## 2022-02-28 DIAGNOSIS — Z90711 Acquired absence of uterus with remaining cervical stump: Secondary | ICD-10-CM

## 2022-02-28 DIAGNOSIS — Z9189 Other specified personal risk factors, not elsewhere classified: Secondary | ICD-10-CM | POA: Diagnosis not present

## 2022-02-28 NOTE — Progress Notes (Unsigned)
76 y.o. [place] Div[place]ced White or Caucasian female here for breast and pelvic exam.  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  Had lumpectomy and then re-excision with Dr. Donne Hazel.  She did post op radiation.  Did stop HRT.  Has't had much in the way of hot flashes but does feel it did help and would still be taking it if she could.    Had pyelonephritis in August.    Just had abdominal pain and had CT scan.  Was treated for diverticulitis.  Was treated Cipro and flagyl.  Also noted was a right lower quadrant mesenteric mass measuring 2.5 x 3cm.  PET scan was done showing  2.7cm mesenteric hypermetabolic mass that is possibly metastatic disease.  Has just seen Dr. Lindi Adie and will be treated with Verzinio x 3 months.  PET scan will be repeated.  Also noted was mildy hypermetabolic finding at vaginal cuff.  H/o supracervical hysterectomy with normal pap last year.    Denies vaginal bleeding.  No LMP recorded. Patient has had a hysterectomy.          Sexually active: No.  H/O STD:  no  Health Maintenance: PCP:  Dr. Lysle Rubens.  Last wellness appt was 03/2021.  Did blood work at that appt: yes Vaccines are up to date:  yes Colonoscopy:  02/18/2018 MMG:  07/06/2021 Needed additional images.  Dx'ed with breast cancer BMD:  03/16/2015 Last pap smear:  01/20/2021 Negative.   H/o abnormal pap smear:  LEEP 2000    reports that she quit smoking about 49 years ago. Her smoking use included cigarettes. She has never used smokeless tobacco. She reports that she does not drink alcohol and does not use drugs.  Past Medical History:  Diagnosis Date   Abnormal Pap smear of vagina    Asthma    Mild   Cancer ([place])    left breast ILC   Chronic kidney disease (CKD)    stage 4   GERD (gastroesophageal reflux disease)    Gout 05/2012   Hernia, incisional    History of abnormal Pap smear    CIN III, 2001   History of uterine fibroid    Hypertension    Peritonitis ([place])    Respiratory  failure ([place])    after ruptured appendix   Ruptured appendix    Seasonal allergies    Shingles 02/2013   very mild case   Urine incontinence     Past Surgical History:  Procedure Laterality Date   APPENDECTOMY     BREAST BIOPSY Right    fibrocystic changes   BREAST BIOPSY Left 08/02/2021   BREAST LUMPECTOMY WITH RADIOACTIVE SEED LOCALIZATION Left 09/12/2021   Procedure: LEFT BREAST LUMPECTOMY WITH RADIOACTIVE SEED LOCALIZATION;  Surgeon: Rolm Bookbinder, MD;  Location: [facility];  Service: General;  Laterality: Left;   CATARACT EXTRACTION Bilateral    COLONOSCOPY     COLONOSCOPY WITH PROPOFOL N/A 02/18/2018   Procedure: COLONOSCOPY WITH PROPOFOL;  Surgeon: Clarene Essex, MD;  Location: [facility];  Service: Endoscopy;  Laterality: N/A;  Use ultraslim scope    LEEP  2000   CIN 2/3   RE-EXCISION OF BREAST LUMPECTOMY Left 09/29/2021   Procedure: LEFT  BREAST RE-EXCISION LUMPECTOMY;  Surgeon: Rolm Bookbinder, MD;  Location: [facility];  Service: General;  Laterality: Left;   REFRACTIVE SURGERY     SUPRACERVICAL ABDOMINAL HYSTERECTOMY  2001    Current Outpatient Medications  Medication Sig Dispense Refill   abemaciclib (VERZENIO) 100  MG tablet Take 1 tablet (100 mg total) by mouth 2 (two) times daily. 60 tablet 0   acetaminophen (TYLENOL) 325 MG tablet Take 650 mg by mouth every 6 (six) hours as needed for moderate pain.     amLODipine (NORVASC) 2.5 MG tablet Take 7.5 mg by mouth daily.     atorvastatin (LIPITOR) 10 MG tablet Take 10 mg by mouth daily.     budesonide (RHINOCORT AQUA) 32 MCG/ACT nasal spray Place 2 sprays into both nostrils daily.     Cholecalciferol (VITAMIN D3) 2000 units TABS Take 2,000 mg by mouth daily.     Coenzyme Q10 (CO Q-10) 200 MG CAPS Take 200 mg by mouth daily.     diclofenac Sodium (VOLTAREN) 1 % GEL Apply 2 g topically 4 (four) times daily as needed (leg pain).     famotidine (PEPCID) 20 MG tablet Take 20 mg by mouth every evening.      febuxostat (ULORIC) 40 MG tablet Take 40 mg by mouth daily.     hydrochlorothiazide (HYDRODIURIL) 12.5 MG tablet Take 12.5 mg by mouth daily.     letrozole (FEMARA) 2.5 MG tablet Take 1 tablet (2.5 mg total) by mouth daily. 90 tablet 3   loratadine (CLARITIN) 10 MG tablet Take 10 mg by mouth 2 (two) times daily.      losartan (COZAAR) 100 MG tablet Take 100 mg by mouth daily.     melatonin 5 MG TABS Take 1 tablet (5 mg total) by mouth at bedtime as needed.  0   pantoprazole (PROTONIX) 40 MG tablet Take 40 mg by mouth daily.     PROAIR HFA 108 (90 BASE) MCG/ACT inhaler Inhale 2 puffs into the lungs every 6 (six) hours as needed (for respiratory issues.).      No current facility-administered medications for this visit.    Family History  Problem Relation Age of Onset   Colon cancer Father    Breast cancer Neg Hx     Review of Systems  Constitutional: Negative.     Exam:   BP (!) 156/68 (BP Location: Right Arm, Patient Position: Sitting, Cuff Size: Large)   Pulse 92   Ht '5\' 2"'$  (1.575 m) Comment: Reported  Wt 161 lb 9.6 oz (73.3 kg)   BMI 29.56 kg/m   Height: '5\' 2"'$  (157.5 cm) (Reported)  General appearance: alert, cooperative and appears stated age Breasts:  no masses, skin changes, nipple discharge, LAD on left; right with well healed scars and radiation changes specifically around the areola, no masses, nipple discharge or LAD Abdomen: soft, non-tender; bowel sounds normal; no masses,  no organomegaly Lymph nodes: Cervical, supraclavicular, and axillary nodes normal.  No abnormal inguinal nodes palpated Neurologic: Grossly normal  Pelvic: External genitalia:  no lesions              Urethra:  normal appearing urethra with no masses, tenderness or lesions              Bartholins and Skenes: normal                 Vagina: normal appearing vagina with atrophic changes and no discharge, no lesions              Cervix:  small portion of cervix present              Pap taken:  No. Bimanual Exam:  Uterus:  uterus absent  Adnexa: no mass, fullness, tenderness               Rectovaginal: Confirms               Anus:  normal sphincter tone, no lesions  Chaperone, Octaviano Batty, CMA, was present for exam.  1. GYN exam for high-risk Medicare patient - Pap smear negative 2022.  Not obtained today. - Mammogram 06/2021 and then diagnosed with breast cancer - Colonoscopy 2019 - Bone mineral density due.  Will check with Dr. Lindi Adie about this (who promptly responded and said to proceed with ordering it) - lab work done with PCP, Dr. Lysle Rubens - vaccines reviewed/updated  2. History of hysterectomy, supracervical  3. Malignant neoplasm of upper-outer quadrant of left breast in female, estrogen receptor positive ([place]) - followed by Dr. Donne Hazel and Dr. Lindi Adie - possible metastatic nodule in mesentery.  Will be treated for this starting soon.  4. Osteopenia, unspecified location - DG BONE DENSITY (DXA); Future  5. History of loop electrical excision procedure (LEEP)  Total time with pt 29 minutes ue to significant history updating that we needed with breast cancer diagnosis, recent CT findings, pyelonephritis this year, stopping HRT.

## 2022-03-01 ENCOUNTER — Encounter (HOSPITAL_BASED_OUTPATIENT_CLINIC_OR_DEPARTMENT_OTHER): Payer: Self-pay

## 2022-03-01 ENCOUNTER — Ambulatory Visit (HOSPITAL_BASED_OUTPATIENT_CLINIC_OR_DEPARTMENT_OTHER): Payer: Medicare Other | Admitting: Obstetrics & Gynecology

## 2022-03-01 ENCOUNTER — Telehealth (HOSPITAL_BASED_OUTPATIENT_CLINIC_OR_DEPARTMENT_OTHER): Payer: Self-pay | Admitting: *Deleted

## 2022-03-01 DIAGNOSIS — Z90711 Acquired absence of uterus with remaining cervical stump: Secondary | ICD-10-CM | POA: Insufficient documentation

## 2022-03-01 DIAGNOSIS — Z9889 Other specified postprocedural states: Secondary | ICD-10-CM | POA: Insufficient documentation

## 2022-03-01 NOTE — Telephone Encounter (Signed)
-----   Message from [name], MD sent at 03/01/2022  7:14 AM EST ----- Regarding: bone density Please let pt know Dr. Lindi Adie recommended she go ahead and have the bone density so I did order it for her at [facility].  Just FYI.  Thanks.  Rachel Villarreal

## 2022-03-01 NOTE — Telephone Encounter (Signed)
Informed pt that Dr. Lindi Adie recommends that she proceed with bone density test. Pt provided with phone number for scheduling.

## 2022-03-07 ENCOUNTER — Ambulatory Visit (HOSPITAL_BASED_OUTPATIENT_CLINIC_OR_DEPARTMENT_OTHER)
Admission: RE | Admit: 2022-03-07 | Discharge: 2022-03-07 | Disposition: A | Discharge status: Home / Self Care | Payer: Medicare Other | Source: Ambulatory Visit | Attending: Obstetrics & Gynecology | Admitting: Obstetrics & Gynecology

## 2022-03-07 DIAGNOSIS — M85851 Other specified disorders of bone density and structure, right thigh: Secondary | ICD-10-CM | POA: Diagnosis not present

## 2022-03-07 DIAGNOSIS — Z853 Personal history of malignant neoplasm of breast: Secondary | ICD-10-CM | POA: Insufficient documentation

## 2022-03-07 DIAGNOSIS — M8580 Other specified disorders of bone density and structure, unspecified site: Secondary | ICD-10-CM | POA: Insufficient documentation

## 2022-03-07 DIAGNOSIS — M85852 Other specified disorders of bone density and structure, left thigh: Secondary | ICD-10-CM | POA: Insufficient documentation

## 2022-03-07 DIAGNOSIS — Z780 Asymptomatic menopausal state: Secondary | ICD-10-CM | POA: Diagnosis not present

## 2022-03-07 DIAGNOSIS — Z13820 Encounter for screening for osteoporosis: Secondary | ICD-10-CM | POA: Insufficient documentation

## 2022-03-08 ENCOUNTER — Other Ambulatory Visit (HOSPITAL_COMMUNITY): Payer: Self-pay

## 2022-03-08 ENCOUNTER — Telehealth (HOSPITAL_BASED_OUTPATIENT_CLINIC_OR_DEPARTMENT_OTHER): Payer: Self-pay | Admitting: Obstetrics & Gynecology

## 2022-03-08 NOTE — Telephone Encounter (Signed)
Opened in error

## 2022-03-10 ENCOUNTER — Other Ambulatory Visit (HOSPITAL_COMMUNITY): Payer: Self-pay

## 2022-03-13 ENCOUNTER — Ambulatory Visit: Payer: Medicare Other | Admitting: Pharmacist

## 2022-03-13 ENCOUNTER — Other Ambulatory Visit (HOSPITAL_COMMUNITY): Payer: Self-pay

## 2022-03-13 ENCOUNTER — Other Ambulatory Visit: Payer: Medicare Other

## 2022-03-21 DIAGNOSIS — J309 Allergic rhinitis, unspecified: Secondary | ICD-10-CM | POA: Diagnosis not present

## 2022-03-21 DIAGNOSIS — M8580 Other specified disorders of bone density and structure, unspecified site: Secondary | ICD-10-CM | POA: Diagnosis not present

## 2022-03-21 DIAGNOSIS — Z0000 Encounter for general adult medical examination without abnormal findings: Secondary | ICD-10-CM | POA: Diagnosis not present

## 2022-03-21 DIAGNOSIS — M109 Gout, unspecified: Secondary | ICD-10-CM | POA: Diagnosis not present

## 2022-03-21 DIAGNOSIS — I10 Essential (primary) hypertension: Secondary | ICD-10-CM | POA: Diagnosis not present

## 2022-03-21 DIAGNOSIS — E559 Vitamin D deficiency, unspecified: Secondary | ICD-10-CM | POA: Diagnosis not present

## 2022-03-21 DIAGNOSIS — K219 Gastro-esophageal reflux disease without esophagitis: Secondary | ICD-10-CM | POA: Diagnosis not present

## 2022-03-21 DIAGNOSIS — C50919 Malignant neoplasm of unspecified site of unspecified female breast: Secondary | ICD-10-CM | POA: Diagnosis not present

## 2022-03-21 DIAGNOSIS — N185 Chronic kidney disease, stage 5: Secondary | ICD-10-CM | POA: Diagnosis not present

## 2022-03-21 DIAGNOSIS — E7800 Pure hypercholesterolemia, unspecified: Secondary | ICD-10-CM | POA: Diagnosis not present

## 2022-03-23 ENCOUNTER — Other Ambulatory Visit: Payer: Self-pay

## 2022-03-23 ENCOUNTER — Other Ambulatory Visit (HOSPITAL_COMMUNITY): Payer: Self-pay

## 2022-03-27 ENCOUNTER — Inpatient Hospital Stay: Payer: Medicare Other | Admitting: Pharmacist

## 2022-03-27 ENCOUNTER — Inpatient Hospital Stay: Payer: Medicare Other | Attending: Hematology and Oncology

## 2022-03-27 VITALS — BP 157/64 | HR 103 | Temp 97.9°F | Resp 18 | Ht 62.0 in | Wt 159.8 lb

## 2022-03-27 DIAGNOSIS — C7951 Secondary malignant neoplasm of bone: Secondary | ICD-10-CM | POA: Diagnosis not present

## 2022-03-27 DIAGNOSIS — C50412 Malignant neoplasm of upper-outer quadrant of left female breast: Secondary | ICD-10-CM

## 2022-03-27 DIAGNOSIS — Z170 Estrogen receptor positive status [ER+]: Secondary | ICD-10-CM | POA: Diagnosis not present

## 2022-03-27 DIAGNOSIS — Z923 Personal history of irradiation: Secondary | ICD-10-CM | POA: Insufficient documentation

## 2022-03-27 DIAGNOSIS — Z79811 Long term (current) use of aromatase inhibitors: Secondary | ICD-10-CM | POA: Diagnosis not present

## 2022-03-27 LAB — CBC WITH DIFFERENTIAL (CANCER CENTER ONLY)
Abs Immature Granulocytes: 0.19 10*3/uL — ABNORMAL HIGH (ref 0.00–0.07)
Basophils Absolute: 0 10*3/uL (ref 0.0–0.1)
Basophils Relative: 0 %
Eosinophils Absolute: 0.1 10*3/uL (ref 0.0–0.5)
Eosinophils Relative: 2 %
HCT: 29.2 % — ABNORMAL LOW (ref 36.0–46.0)
Hemoglobin: 9.8 g/dL — ABNORMAL LOW (ref 12.0–15.0)
Immature Granulocytes: 3 %
Lymphocytes Relative: 10 %
Lymphs Abs: 0.7 10*3/uL (ref 0.7–4.0)
MCH: 29.4 pg (ref 26.0–34.0)
MCHC: 33.6 g/dL (ref 30.0–36.0)
MCV: 87.7 fL (ref 80.0–100.0)
Monocytes Absolute: 0.2 10*3/uL (ref 0.1–1.0)
Monocytes Relative: 3 %
Neutro Abs: 6 10*3/uL (ref 1.7–7.7)
Neutrophils Relative %: 82 %
Platelet Count: 196 10*3/uL (ref 150–400)
RBC: 3.33 MIL/uL — ABNORMAL LOW (ref 3.87–5.11)
RDW: 13 % (ref 11.5–15.5)
WBC Count: 7.2 10*3/uL (ref 4.0–10.5)
nRBC: 0 % (ref 0.0–0.2)

## 2022-03-27 LAB — CMP (CANCER CENTER ONLY)
ALT: 9 U/L (ref 0–44)
AST: 11 U/L — ABNORMAL LOW (ref 15–41)
Albumin: 4 g/dL (ref 3.5–5.0)
Alkaline Phosphatase: 62 U/L (ref 38–126)
Anion gap: 10 (ref 5–15)
BUN: 69 mg/dL — ABNORMAL HIGH (ref 8–23)
CO2: 17 mmol/L — ABNORMAL LOW (ref 22–32)
Calcium: 9.7 mg/dL (ref 8.9–10.3)
Chloride: 109 mmol/L (ref 98–111)
Creatinine: 5.55 mg/dL (ref 0.44–1.00)
GFR, Estimated: 7 mL/min — ABNORMAL LOW (ref >60)
Glucose, Bld: 135 mg/dL — ABNORMAL HIGH (ref 70–99)
Potassium: 4.8 mmol/L (ref 3.5–5.1)
Sodium: 136 mmol/L (ref 135–145)
Total Bilirubin: 0.4 mg/dL (ref 0.3–1.2)
Total Protein: 6.5 g/dL (ref 6.5–8.1)

## 2022-03-27 NOTE — Progress Notes (Signed)
[place]       Telephone: [phone]?Fax: [phone]   Oncology Clinical Pharmacist Practitioner Initial Assessment  Rachel Villarreal is a 76 y.o. female with a diagnosis of breast cancer. They were contacted today via in-person visit. 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  Indication/Regimen Abemaciclib (Verzenio) is being used appropriately for treatment of breast cancer by Dr. Nicholas Lose.      Wt Readings from Last 1 Encounters:  03/27/22 159 lb 12.8 oz (72.5 kg)    Estimated body surface area is 1.78 meters squared as calculated from the following:   Height as of this encounter: '5\' 2"'$  (1.575 m).   Weight as of this encounter: 159 lb 12.8 oz (72.5 kg).  The dosing regimen is 100 mg by mouth every 12 hours on days 1 to 28 of a 28-day cycle. This is being given  in combination with letrozole . It is planned to continue until  per [name], Dr. Lindi Adie will perform restaging scans in 3 months to determine how long she will take abemaciclib .  Clinical pharmacy saw Rachel Villarreal today after being referred by Dr. Lindi Adie for abemaciclib management.  She states that she started abemaciclib 100 mg every 12 hours on 03/13/22.  Overall, she states she is tolerating abemaciclib fairly well thus far.  She has had about 6 episodes of loose stool in the last 2 weeks which have responded to loperamide.  She does have a history of chronic kidney disease, stage IV.  Baseline serum creatinine levels were last done 02/06/22, and at that time, her serum creatinine was estimated at 3.91 mg/dL per [name]'s report.  [place] also has a value estimated at 3.88 mg/dL on 11/27/21.  Today, her serum creatinine is estimated at 5.55 mg/dL.  This is approximately 1.43 times her baseline value when using her value from August 20.  Her CTCAE v5.0, this would qualify her with a grade 2 toxicity due to her elevated baseline  level.  Per manufacturing guidelines, abemaciclib should be held for a grade 3 toxicity.  She does report proper fluid intake and she does wear a pad for urine absorption which she says she has changed several times a day.  Her BUN has remained relatively stable.  Her potassium is also stable. We did discuss these findings with Dr. Chryl Heck (Dr. Lindi Adie was not available), and out of an abundance of caution, we will hold abemaciclib for 1 week and reassess her kidney function on 04/04/22.  She also follows with nephrology with a Dr. Danie Chandler that she sees every 2 months.  She does have a follow-up appointment with their office this Wednesday, 03/29/22, and we will forward our visit note from today to Dr. Johnney Ou and Dr. Lindi Adie so they are aware.  We have also told Rachel Villarreal to hold her hydrochlorothiazide since it is not recommended at her current creatinine clearance which is estimated at 7.8 mL/min.  We also told her to reduce her frequency of loratadine which she was taking twice daily at the 10 mg dose.  We discussed these recommendations with [name] and she was in agreement with this plan.  We also discussed with Rachel Villarreal that we may need to reduce her abemaciclib to 50 mg every 12 hours should she have toxicity that is sustained from the abemaciclib.  She did state that she responded very well to loperamide which  is promising.She is not reporting any other side effects at this time.   Clinical pharmacy also discussed possibly adding a bone strengthener at a later time.  We did give her the dental clearance form that she will give to her dentist.  She can bring this back in one of her upcoming appointments.  We stated that we would likely start her on either denosumab or zoledronic acid at a later time.  She stated that she prefers to see what the restaging scans show in 3 months before making a decision.  We discussed that if she does have bone metastases that these would be the preferred agents  and because she has chronic kidney disease, denosumab would likely be the better choice.   Dose Modifications Dr. Lindi Adie is currently giving abemaciclib at a reduced dose of 100 mg every 12 hours  Access Assessment Rachel Villarreal will be receiving abemaciclib through [facility] Concerns: She does not qualify for drug assistance due to her income Start date if known: 03/13/22  Allergies Allergies  Allergen Reactions   Ace Inhibitors     angioedema   Lactose Intolerance (Gi)    Lisinopril Other (See Comments)    unsure   Mercury     Red eyes, through eye drops that contained thimerosal    Tape     plastic    Vitals    03/27/2022    2:56 PM 02/28/2022    3:06 PM 02/16/2022    1:32 PM  Oncology Vitals  Height 158 cm 158 cm 158 cm  Weight 72.485 kg 73.301 kg 73.165 kg  Weight (lbs) 159 lbs 13 oz 161 lbs 10 oz 161 lbs 5 oz  BMI 29.23 kg/m2   29.23 kg/m2 29.56 kg/m2   29.56 kg/m2 29.5 kg/m2   29.5 kg/m2  Temp 97.9 F (36.6 C)  97.9 F (36.6 C)  Pulse Rate 103 92 107  BP 157/64 156/68 154/76  Resp 18  18  SpO2 100 %  98 %  BSA (m2) 1.78 m2   1.78 m2 1.79 m2   1.79 m2 1.79 m2   1.79 m2     Laboratory Data    Latest Ref Rng & Units 03/27/2022    2:24 PM 11/27/2021    7:24 PM 10/11/2021    9:36 AM  CBC EXTENDED  WBC 4.0 - 10.5 K/uL 7.2  15.6  8.7   RBC 3.87 - 5.11 MIL/uL 3.33  3.91  3.68   Hemoglobin 12.0 - 15.0 g/dL 9.8  11.5  10.5   HCT 36.0 - 46.0 % 29.2  33.6  32.4   Platelets 150 - 400 K/uL 196  257  302   NEUT# 1.7 - 7.7 K/uL 6.0   6.3   Lymph# 0.7 - 4.0 K/uL 0.7   1.2        Latest Ref Rng & Units 03/27/2022    2:24 PM 11/27/2021    7:24 PM 10/11/2021    9:36 AM  CMP  Glucose 70 - 99 mg/dL 135  137  104   BUN 8 - 23 mg/dL 69  60  52   Creatinine 0.44 - 1.00 mg/dL 5.55  3.88  3.50   Sodium 135 - 145 mmol/L 136  139  140   Potassium 3.5 - 5.1 mmol/L 4.8  4.4  4.7   Chloride 98 - 111 mmol/L 109  111  107   CO2 22 - 32  mmol/L 17  14  18  Calcium 8.9 - 10.3 mg/dL 9.7  9.5  9.7   Total Protein 6.5 - 8.1 g/dL 6.5   7.2   Total Bilirubin 0.3 - 1.2 mg/dL 0.4   0.5   Alkaline Phos 38 - 126 U/L 62   60   AST 15 - 41 U/L 11   12   ALT 0 - 44 U/L 9   7    Contraindications Contraindications were reviewed?  Yes Contraindications to therapy were identified?  No  Safety Precautions The following safety precautions for the use of abemaciclib were reviewed:  Diarrhea: we reviewed that diarrhea is common with abemaciclib and confirmed that she does have loperamide (Imodium) at home.  We reviewed how to take this medication PRN and gave her information on abemaciclib Neutropenia: we discussed the importance of having a thermometer and what the Centers for Disease Control and Prevention (CDC) considers a fever which is 100.79F (38C) or higher.  Gave patient 24/7 triage line to call if any fevers or symptoms ILD/Pneumonitis: we reviewed potential symptoms including cough, shortness, and fatigue. Hepatotoxicity: reviewed to contact clinic for RUQ pain that will not subside, yellowing of eyes/skin Venous thromboembolism (VTE): reviewed signs of deep vein thrombosis (DVT) such as leg swelling, redness, pain, or tenderness and signs of pulmonary embolism (PE) such as shortness of breath, rapid or irregular heartbeat, cough, chest pain, or lightheadedness Reviewed to take the medication every 12 hours (with food sometimes can be easier on the stomach) and to take it at the same time every day. Discussed proper storage and handling of abemaciclib  Medication Reconciliation Current Outpatient Medications  Medication Sig Dispense Refill   abemaciclib (VERZENIO) 100 MG tablet Take 1 tablet (100 mg total) by mouth 2 (two) times daily. 60 tablet 0   acetaminophen (TYLENOL) 500 MG tablet Take 500 mg by mouth every 6 (six) hours as needed for moderate pain.     amLODipine (NORVASC) 2.5 MG tablet Take 7.5 mg by mouth daily.      atorvastatin (LIPITOR) 10 MG tablet Take 10 mg by mouth daily.     budesonide (RHINOCORT AQUA) 32 MCG/ACT nasal spray Place 2 sprays into both nostrils daily.     Cholecalciferol (VITAMIN D3) 2000 units TABS Take 2,000 Int'l Units/day by mouth daily.     Coenzyme Q10 (CO Q-10) 200 MG CAPS Take 200 mg by mouth daily.     diclofenac Sodium (VOLTAREN) 1 % GEL Apply 2 g topically 4 (four) times daily as needed (leg pain).     febuxostat (ULORIC) 40 MG tablet Take 40 mg by mouth daily.     letrozole (FEMARA) 2.5 MG tablet Take 1 tablet (2.5 mg total) by mouth daily. 90 tablet 3   loratadine (CLARITIN) 10 MG tablet Take 10 mg by mouth 2 (two) times daily. Once daily ineffective per patient report     losartan (COZAAR) 100 MG tablet Take 100 mg by mouth daily.     melatonin 5 MG TABS Take 1 tablet (5 mg total) by mouth at bedtime as needed.  0   pantoprazole (PROTONIX) 40 MG tablet Take 40 mg by mouth daily.     PROAIR HFA 108 (90 BASE) MCG/ACT inhaler Inhale 2 puffs into the lungs every 6 (six) hours as needed (for respiratory issues.).      hydrochlorothiazide (HYDRODIURIL) 12.5 MG tablet Take 12.5 mg by mouth daily. (Patient not taking: Reported on 03/27/2022)     No current facility-administered medications for this visit.   Medication  reconciliation is based on the patient's most recent medication list in the electronic medical record (EMR) including herbal products and OTC medications.   The patient's medication list was reviewed today with the patient?  Yes  Drug-drug interactions (DDIs) DDIs were evaluated?  Yes Significant DDIs identified?  No  Drug-Food Interactions Drug-food interactions were evaluated?  Yes Drug-food interactions identified?  Yes, patient should avoid grapefruit products  Follow-up Plan  HOLD abemaciclib until repeat labs are done next week with clinical pharmacy due to serum creatinine elevation and diarrhea Continue letrozole 2.5 mg by mouth daily Ms. Gotay  will see her nephrologist Dr. Johnney Ou on Wednesday and will hold hydrochlorothiazide until that time since it is not recommended with her current estimated creatinine clearance of 7.8 mL/min.  We will forward Dr. Johnney Ou and Dr. Lindi Adie our visit summary from today Continue loperamide as needed for diarrhea May need to reduce abemaciclib at a later time Ms. Naser knows that should her kidney function worsen, she should contact Dr. Geralyn Flash clinic immediately prior to her next visit  LYNNMARIE LOVETT participated in the discussion, expressed understanding, and voiced agreement with the above plan. All questions were answered to her satisfaction. The patient was advised to contact the clinic at [phone] with any questions or concerns prior to her return visit.   I spent 60 minutes assessing the patient.  Raina Mina, RPH-CPP, 03/27/2022 4:26 PM  **Disclaimer: This note was dictated with voice recognition software. Similar sounding words can inadvertently be transcribed and this note may contain transcription errors which may not have been corrected upon publication of note.**

## 2022-03-27 NOTE — Progress Notes (Signed)
CRITICAL VALUE STICKER  CRITICAL VALUE: Creatinine 5.55  RECEIVER (on-site recipient of call): Richarda Overlie, RN  Bruno NOTIFIED: 12/18 1514  MESSENGER (representative from lab): Gustavus Messing  MD NOTIFIED: Gilford Rile  TIME OF NOTIFICATION:1516  RESPONSE:  RPH/CPP aware

## 2022-03-28 ENCOUNTER — Telehealth: Payer: Self-pay | Admitting: Pharmacist

## 2022-03-28 NOTE — Telephone Encounter (Signed)
Scheduled appointment per 12/18 los. Patient is aware.

## 2022-03-29 DIAGNOSIS — N185 Chronic kidney disease, stage 5: Secondary | ICD-10-CM | POA: Diagnosis not present

## 2022-03-29 DIAGNOSIS — M109 Gout, unspecified: Secondary | ICD-10-CM | POA: Diagnosis not present

## 2022-03-29 DIAGNOSIS — D631 Anemia in chronic kidney disease: Secondary | ICD-10-CM | POA: Diagnosis not present

## 2022-03-29 DIAGNOSIS — I120 Hypertensive chronic kidney disease with stage 5 chronic kidney disease or end stage renal disease: Secondary | ICD-10-CM | POA: Diagnosis not present

## 2022-03-29 DIAGNOSIS — E7800 Pure hypercholesterolemia, unspecified: Secondary | ICD-10-CM | POA: Diagnosis not present

## 2022-03-31 ENCOUNTER — Other Ambulatory Visit: Payer: Self-pay | Admitting: *Deleted

## 2022-03-31 ENCOUNTER — Other Ambulatory Visit (HOSPITAL_COMMUNITY): Payer: Self-pay

## 2022-03-31 DIAGNOSIS — Z170 Estrogen receptor positive status [ER+]: Secondary | ICD-10-CM

## 2022-04-02 IMAGING — MG MM DIGITAL SCREENING BILAT W/ TOMO AND CAD
8 series · 8 of 24 positions shown · non-contrast
Comparison: Previous exam(s).

CLINICAL DATA: Screening.

EXAM:
DIGITAL SCREENING BILATERAL MAMMOGRAM WITH TOMOSYNTHESIS AND CAD
TECHNIQUE: Bilateral screening digital craniocaudal and mediolateral oblique
mammograms were obtained. Bilateral screening digital breast
tomosynthesis was performed. The images were evaluated with
computer-aided detection.

[R MLO synth-2D]
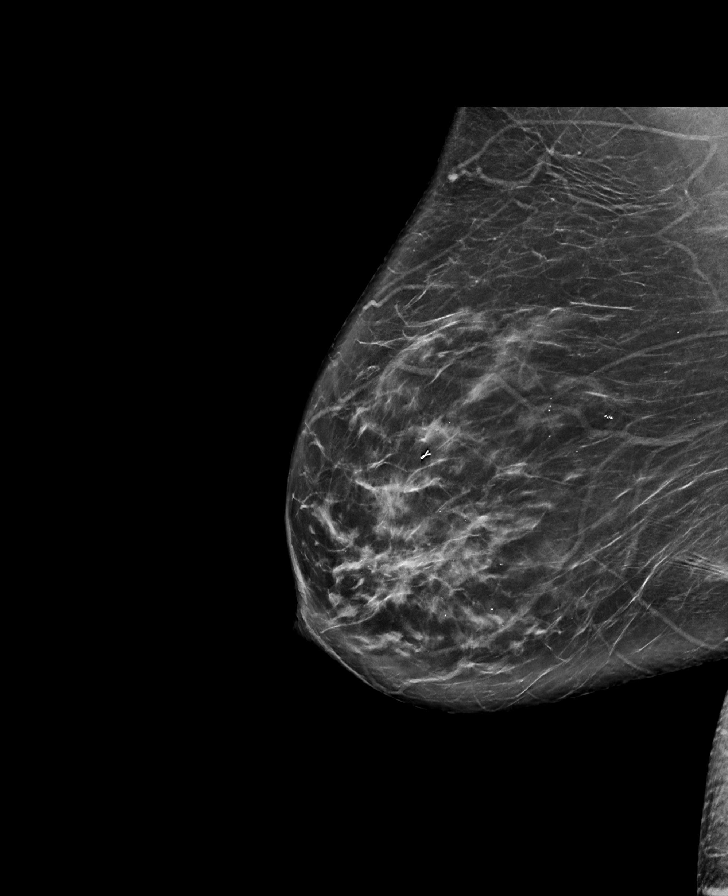

[R CC synth-2D]
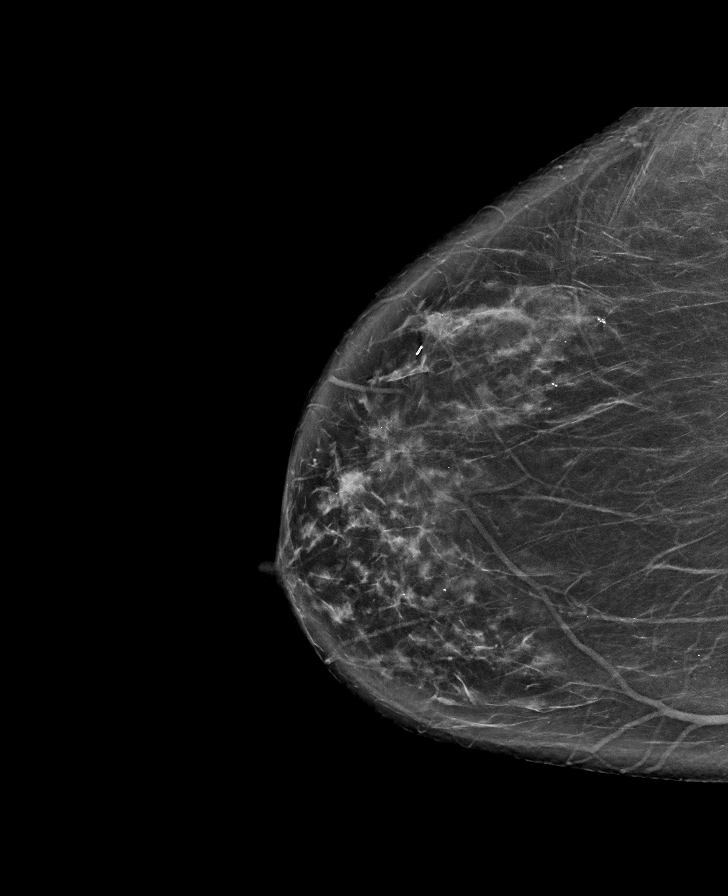

[L CC synth-2D]
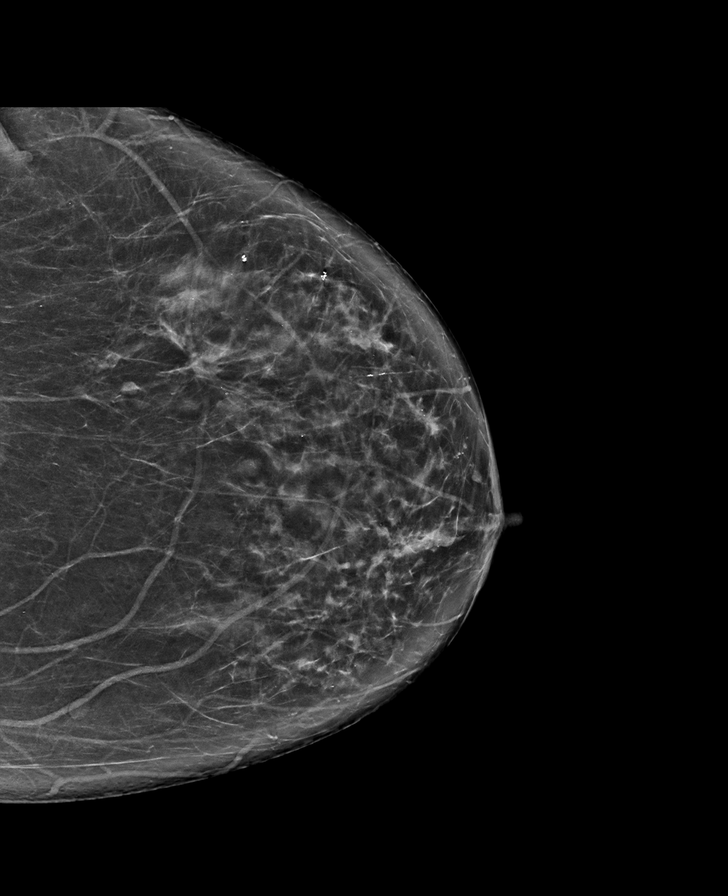

[L MLO synth-2D]
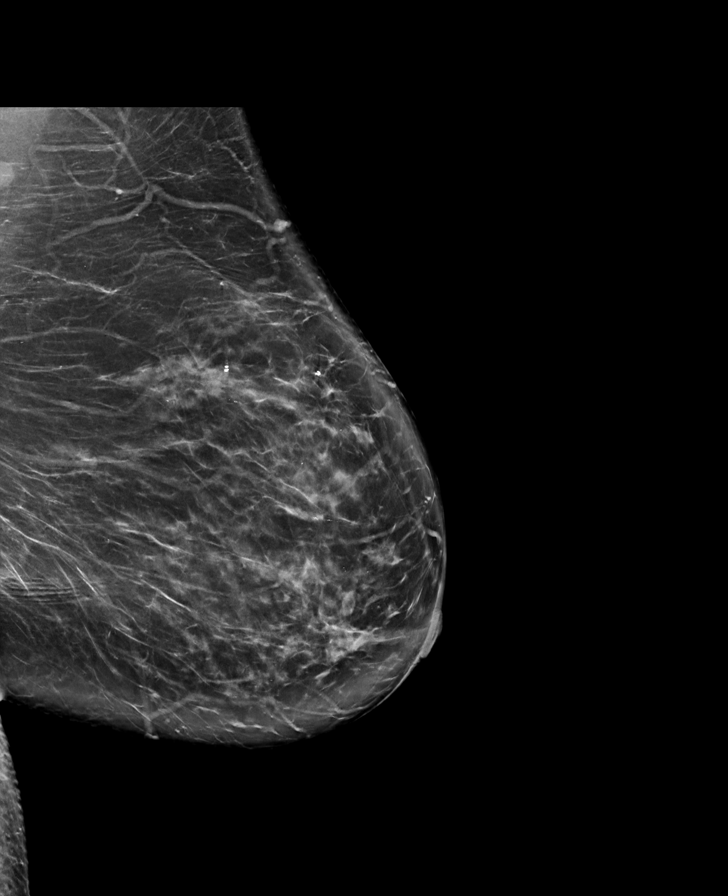

[L MLO tomo · tomo slice 46/91.0]
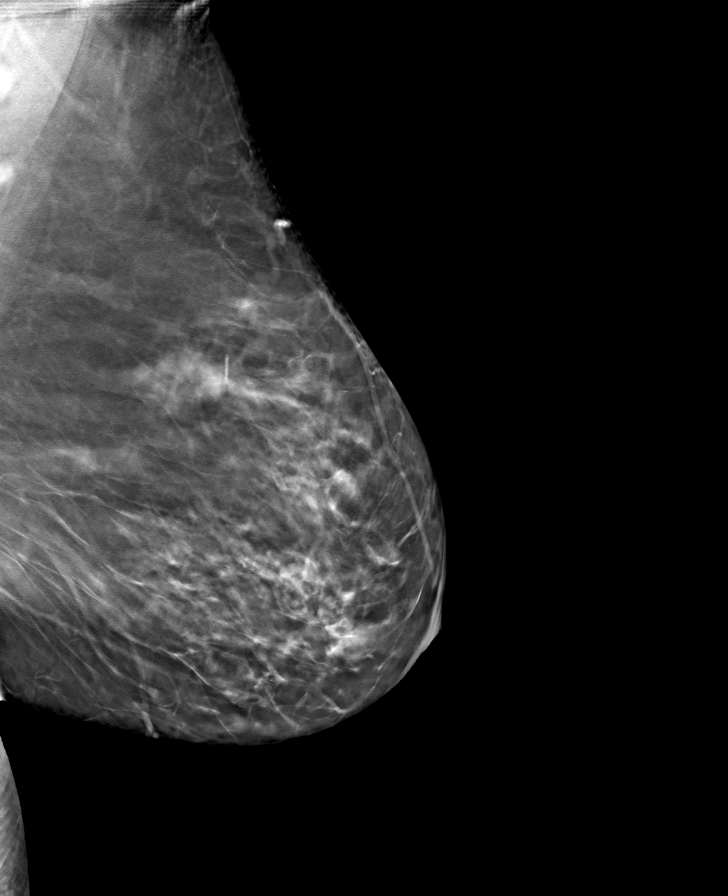

[R CC tomo · tomo slice 39/76.0]
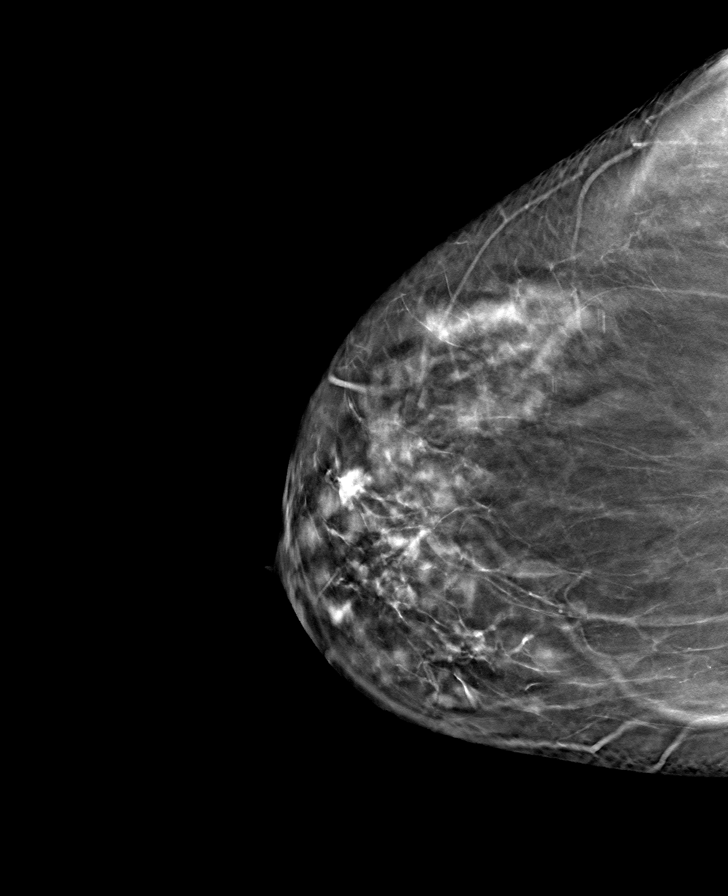

[L CC tomo · tomo slice 38/75.0]
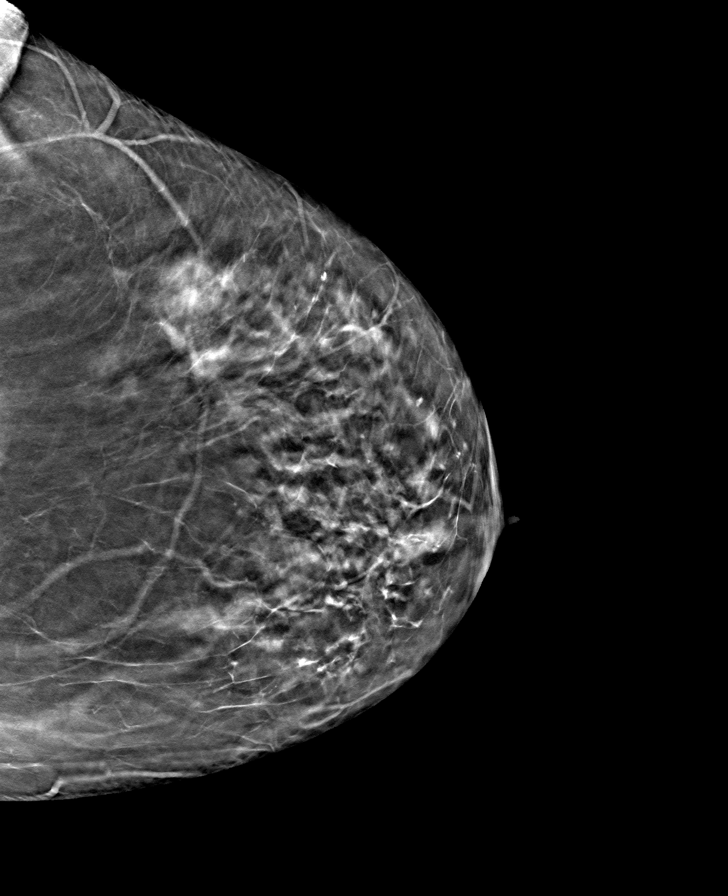

[R MLO tomo · tomo slice 43/84.0]
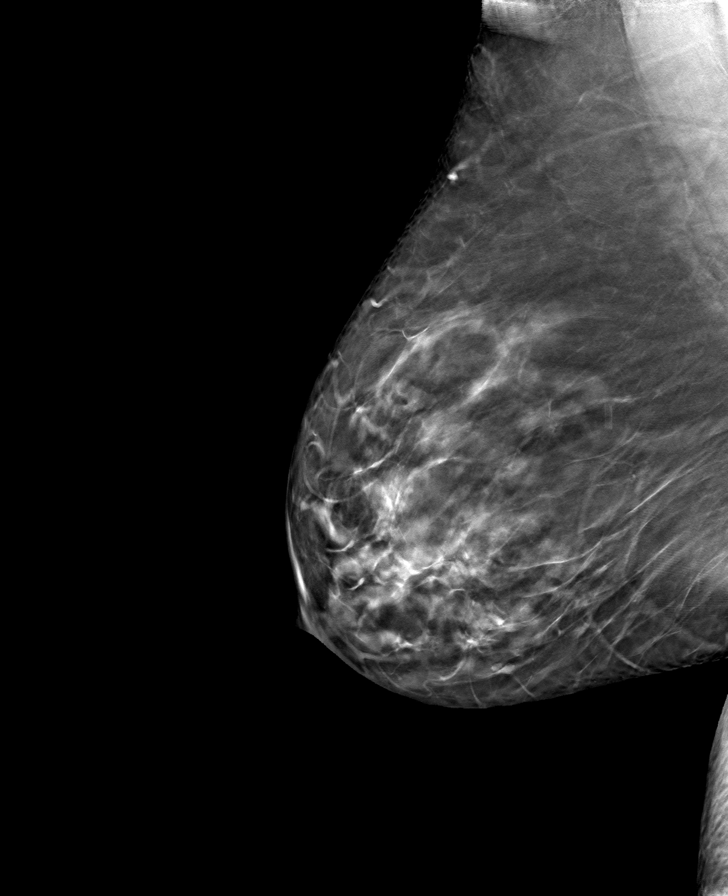

[8 of 24 positions shown; findings below may reference images not displayed]

ACR Breast Density Category c: The breast tissue is heterogeneously
dense, which may obscure small masses.
FINDINGS: In the left breast, possible distortion warrants further evaluation.
In the right breast, no findings suspicious for malignancy.
IMPRESSION: Further evaluation is suggested for possible distortion in the left
breast.

RECOMMENDATION:
Diagnostic mammogram and possibly ultrasound of the left breast.
(Code:19-D-HH3)

The patient will be contacted regarding the findings, and additional
imaging will be scheduled.

BI-RADS CATEGORY  0: Incomplete. Need additional imaging evaluation
and/or prior mammograms for comparison.

## 2022-04-04 ENCOUNTER — Other Ambulatory Visit (HOSPITAL_COMMUNITY): Payer: Self-pay

## 2022-04-04 ENCOUNTER — Other Ambulatory Visit: Payer: Self-pay | Admitting: *Deleted

## 2022-04-04 ENCOUNTER — Inpatient Hospital Stay (HOSPITAL_BASED_OUTPATIENT_CLINIC_OR_DEPARTMENT_OTHER): Payer: Medicare Other

## 2022-04-04 ENCOUNTER — Inpatient Hospital Stay: Payer: Medicare Other | Admitting: Pharmacist

## 2022-04-04 VITALS — BP 162/69 | HR 101 | Temp 97.5°F | Resp 18 | Ht 62.0 in | Wt 161.7 lb

## 2022-04-04 DIAGNOSIS — C7951 Secondary malignant neoplasm of bone: Secondary | ICD-10-CM | POA: Diagnosis not present

## 2022-04-04 DIAGNOSIS — Z170 Estrogen receptor positive status [ER+]: Secondary | ICD-10-CM | POA: Diagnosis not present

## 2022-04-04 DIAGNOSIS — Z923 Personal history of irradiation: Secondary | ICD-10-CM | POA: Diagnosis not present

## 2022-04-04 DIAGNOSIS — N184 Chronic kidney disease, stage 4 (severe): Secondary | ICD-10-CM

## 2022-04-04 DIAGNOSIS — Z79811 Long term (current) use of aromatase inhibitors: Secondary | ICD-10-CM | POA: Diagnosis not present

## 2022-04-04 DIAGNOSIS — C50412 Malignant neoplasm of upper-outer quadrant of left female breast: Secondary | ICD-10-CM | POA: Diagnosis not present

## 2022-04-04 LAB — CBC WITH DIFFERENTIAL (CANCER CENTER ONLY)
Abs Immature Granulocytes: 0.02 10*3/uL (ref 0.00–0.07)
Basophils Absolute: 0 10*3/uL (ref 0.0–0.1)
Basophils Relative: 0 %
Eosinophils Absolute: 0.1 10*3/uL (ref 0.0–0.5)
Eosinophils Relative: 2 %
HCT: 27.3 % — ABNORMAL LOW (ref 36.0–46.0)
Hemoglobin: 9.2 g/dL — ABNORMAL LOW (ref 12.0–15.0)
Immature Granulocytes: 1 %
Lymphocytes Relative: 18 %
Lymphs Abs: 0.7 10*3/uL (ref 0.7–4.0)
MCH: 29.2 pg (ref 26.0–34.0)
MCHC: 33.7 g/dL (ref 30.0–36.0)
MCV: 86.7 fL (ref 80.0–100.0)
Monocytes Absolute: 0.2 10*3/uL (ref 0.1–1.0)
Monocytes Relative: 6 %
Neutro Abs: 2.9 10*3/uL (ref 1.7–7.7)
Neutrophils Relative %: 73 %
Platelet Count: 123 10*3/uL — ABNORMAL LOW (ref 150–400)
RBC: 3.15 MIL/uL — ABNORMAL LOW (ref 3.87–5.11)
RDW: 13 % (ref 11.5–15.5)
WBC Count: 3.9 10*3/uL — ABNORMAL LOW (ref 4.0–10.5)
nRBC: 0 % (ref 0.0–0.2)

## 2022-04-04 LAB — CMP (CANCER CENTER ONLY)
ALT: 9 U/L (ref 0–44)
AST: 12 U/L — ABNORMAL LOW (ref 15–41)
Albumin: 4 g/dL (ref 3.5–5.0)
Alkaline Phosphatase: 64 U/L (ref 38–126)
Anion gap: 10 (ref 5–15)
BUN: 59 mg/dL — ABNORMAL HIGH (ref 8–23)
CO2: 18 mmol/L — ABNORMAL LOW (ref 22–32)
Calcium: 9.3 mg/dL (ref 8.9–10.3)
Chloride: 112 mmol/L — ABNORMAL HIGH (ref 98–111)
Creatinine: 4.23 mg/dL (ref 0.44–1.00)
GFR, Estimated: 10 mL/min — ABNORMAL LOW (ref >60)
Glucose, Bld: 108 mg/dL — ABNORMAL HIGH (ref 70–99)
Potassium: 4.2 mmol/L (ref 3.5–5.1)
Sodium: 140 mmol/L (ref 135–145)
Total Bilirubin: 0.3 mg/dL (ref 0.3–1.2)
Total Protein: 6.6 g/dL (ref 6.5–8.1)

## 2022-04-04 MED ORDER — ABEMACICLIB 50 MG PO TABS
50.0000 mg | ORAL_TABLET | Freq: Two times a day (BID) | ORAL | 3 refills | Status: DC
  Filled 2022-04-04 (×2): qty 56, 28d supply, fill #0
  Filled 2022-05-04 – 2022-05-05 (×2): qty 56, 28d supply, fill #1
  Filled 2022-05-29: qty 56, 28d supply, fill #2
  Filled 2022-06-27: qty 56, 28d supply, fill #3

## 2022-04-04 NOTE — Progress Notes (Signed)
[place]       Telephone: [phone]?Fax: [phone]   Oncology Clinical Pharmacist Practitioner Progress Note  Rachel Villarreal was contacted via in-person to discuss her chemotherapy regimen for abemaciclib which they receive under the care of Dr. Nicholas Lose.  Current treatment regimen and start date Abemaciclib (03/13/22) Letrozole (12/09/21)  Interval History She continues to HOLD abemaciclib 100 mg by mouth every 12 hours on days 1 to 28 of a 28-day cycle. This is being given in combination with letrozole . Therapy is planned to continue until disease progression or unacceptable toxicity.  Rachel Villarreal was seen today as a follow-up to her abemaciclib management.  She had been holding the abemaciclib due to diarrhea, fatigue, and elevated serum creatinine.  She does have known CKD and saw nephrology last Wednesday who agreed that she should discontinue her HCTZ.  She will continue to follow-up with nephrology and her next appointment is sometime in January.  Response to Therapy [name] continues to hold abemaciclib 100 mg every 12 hours.  She states that she is feeling much better and that her energy has improved.  Her serum creatinine has come down from last week as well.  Today is estimated at 4.23 mg/dL.  Clinical pharmacy explained to Rachel Villarreal today that she should discontinue the abemaciclib 100 mg every 12 hour dose.  We have sent a new dose of 50 mg every 12 hours which will start sometime next year.  This has been sent to the [facility] outpatient pharmacy.  She will then follow-up with Dr. Lindi Adie and labs 2 weeks after starting (3 weeks from today) to evaluate if the abemaciclib at the lowest dose possible can be continued.  Nephrology did mention to her that she will likely be starting dialysis at some point.  Rachel Villarreal verbalized understanding of the plan and was in agreement. Labs, vitals, treatment parameters, and manufacturer guidelines assessing toxicity  were reviewed with [name] today. Based on these values, patient is in agreement to hold abemaciclib therapy at this time.  Allergies Allergies  Allergen Reactions   Ace Inhibitors     angioedema   Lactose Intolerance (Gi)    Lisinopril Other (See Comments)    unsure   Mercury     Red eyes, through eye drops that contained thimerosal    Tape     plastic   Vitals    04/04/2022    3:00 PM 03/27/2022    2:56 PM 02/28/2022    3:06 PM  Oncology Vitals  Height 158 cm 158 cm 158 cm  Weight 73.347 kg 72.485 kg 73.301 kg  Weight (lbs) 161 lbs 11 oz 159 lbs 13 oz 161 lbs 10 oz  BMI 29.58 kg/m2   29.58 kg/m2 29.23 kg/m2   29.23 kg/m2 29.56 kg/m2   29.56 kg/m2  Temp 97.5 F (36.4 C) 97.9 F (36.6 C)   Pulse Rate 101 103 92  BP 162/69 157/64 156/68  Resp 18 18   SpO2 100 % 100 %   BSA (m2) 1.79 m2   1.79 m2 1.78 m2   1.78 m2 1.79 m2   1.79 m2    Laboratory Data    Latest Ref Rng & Units 04/04/2022    2:21 PM 03/27/2022    2:24 PM 11/27/2021    7:24 PM  CBC EXTENDED  WBC 4.0 - 10.5 K/uL 3.9  7.2  15.6   RBC 3.87 - 5.11 MIL/uL 3.15  3.33  3.91   Hemoglobin 12.0 - 15.0 g/dL 9.2  9.8  11.5   HCT 36.0 - 46.0 % 27.3  29.2  33.6   Platelets 150 - 400 K/uL 123  196  257   NEUT# 1.7 - 7.7 K/uL 2.9  6.0    Lymph# 0.7 - 4.0 K/uL 0.7  0.7         Latest Ref Rng & Units 04/04/2022    2:21 PM 03/27/2022    2:24 PM 11/27/2021    7:24 PM  CMP  Glucose 70 - 99 mg/dL 108  135  137   BUN 8 - 23 mg/dL 59  69  60   Creatinine 0.44 - 1.00 mg/dL 4.23  5.55  3.88   Sodium 135 - 145 mmol/L 140  136  139   Potassium 3.5 - 5.1 mmol/L 4.2  4.8  4.4   Chloride 98 - 111 mmol/L 112  109  111   CO2 22 - 32 mmol/L '18  17  14   '$ Calcium 8.9 - 10.3 mg/dL 9.3  9.7  9.5   Total Protein 6.5 - 8.1 g/dL 6.6  6.5    Total Bilirubin 0.3 - 1.2 mg/dL 0.3  0.4    Alkaline Phos 38 - 126 U/L 64  62    AST 15 - 41 U/L 12  11    ALT 0 - 44 U/L 9  9      No results found for: "MG" No results  found for: "CA2729"   Adverse Effects Assessment Hemoglobin: Slightly decreased from last visit.  Will continue to monitor. Serum creatinine: Decreased since last visit.  Will continue to closely monitor at the lower dose of abemaciclib 50 mg every 12 hours.  Adherence Assessment Rachel Villarreal reports missing 0 doses over the past 1 weeks.  She has been holding abemaciclib due to toxicity Reason for missed dose: Toxicity Patient was re-educated on importance of adherence.   Access Assessment Rachel Villarreal is currently receiving her abemaciclib through [facility] concerns: None  Medication Reconciliation The patient's medication list was reviewed today with the patient?  Yes New medications or herbal supplements have recently been started?  No Any medications have been discontinued?  Yes, nephrology has discontinued HCTZ The medication list was updated and reconciled based on the patient's most recent medication list in the electronic medical record (EMR) including herbal products and OTC medications.   Medications Current Outpatient Medications  Medication Sig Dispense Refill   [START ON 04/11/2022] abemaciclib (VERZENIO) 50 MG tablet Take 1 tablet (50 mg total) by mouth 2 (two) times daily. Swallow tablets whole. Do not chew, crush, or split tablets before swallowing. 56 tablet 3   acetaminophen (TYLENOL) 500 MG tablet Take 500 mg by mouth every 6 (six) hours as needed for moderate pain.     amLODipine (NORVASC) 2.5 MG tablet Take 7.5 mg by mouth daily.     atorvastatin (LIPITOR) 10 MG tablet Take 10 mg by mouth daily.     budesonide (RHINOCORT AQUA) 32 MCG/ACT nasal spray Place 2 sprays into both nostrils daily.     Cholecalciferol (VITAMIN D3) 2000 units TABS Take 2,000 Int'l Units/day by mouth daily.     Coenzyme Q10 (CO Q-10) 200 MG CAPS Take 200 mg by mouth daily.     diclofenac Sodium (VOLTAREN) 1 % GEL Apply 2 g topically 4 (four) times daily as  needed (leg pain).     febuxostat (ULORIC) 40 MG  tablet Take 40 mg by mouth daily.     letrozole (FEMARA) 2.5 MG tablet Take 1 tablet (2.5 mg total) by mouth daily. 90 tablet 3   loratadine (CLARITIN) 10 MG tablet Take 5 mg by mouth 2 (two) times daily. Once daily ineffective per patient report     losartan (COZAAR) 100 MG tablet Take 100 mg by mouth daily.     melatonin 5 MG TABS Take 1 tablet (5 mg total) by mouth at bedtime as needed.  0   pantoprazole (PROTONIX) 40 MG tablet Take 40 mg by mouth daily.     PROAIR HFA 108 (90 BASE) MCG/ACT inhaler Inhale 2 puffs into the lungs every 6 (six) hours as needed (for respiratory issues.).      No current facility-administered medications for this visit.   Drug-Drug Interactions (DDIs) DDIs were evaluated?  Yes Significant DDIs?  Ms. Vanzile prefers to take loratadine twice daily.  This was discussed that her last visit The patient was instructed to speak with their health care provider and/or the oral chemotherapy pharmacist before starting any new drug, including prescription or over the counter, natural / herbal products, or vitamins.  Supportive Care Diarrhea: we reviewed that diarrhea is common with abemaciclib and confirmed that she does have loperamide (Imodium) at home.  We reviewed how to take this medication PRN. Neutropenia: we discussed the importance of having a thermometer and what the Centers for Disease Control and Prevention (CDC) considers a fever which is 100.17F (38C) or higher.  Gave patient 24/7 triage line to call if any fevers or symptoms. ILD/Pneumonitis: we reviewed potential symptoms including cough, shortness, and fatigue.  VTE: reviewed signs of DVT such as leg swelling, redness, pain, or tenderness and signs of PE such as shortness of breath, rapid or irregular heartbeat, cough, chest pain, or lightheadedness. Reviewed to take the medication every 12 hours (with food sometimes can be easier on the stomach) and to take it  at the same time every day.  Dosing Assessment Hepatic adjustments needed?  No Renal adjustments needed?  No, baseline value of serum creatinine is 3.38 on 09/29/2021 Toxicity adjustments needed?  Yes, due to fatigue and diarrhea, will reduce dose to abemaciclib 50 mg every 12 hours The current dosing regimen is not appropriate to continue at this time.  Follow-Up Plan Discontinue abemaciclib 100 mg every 12 hours Start abemaciclib 50 mg by mouth every 12 hours once received next week.  Will follow-up with labs and Dr. Lindi Adie visit 2 weeks after starting Continue to follow with nephrology.  Removed HCTZ from medication list Continue letrozole 2.5 mg by mouth daily Labs, Dr. Lindi Adie visit, in 3 weeks Labs, pharmacy clinic visit, in 5 weeks Ms. Wagoner knows to monitor for increases in diarrhea or side effects and will contact Dr. Geralyn Flash clinic immediately should these occur.  [name] participated in the discussion, expressed understanding, and voiced agreement with the above plan. All questions were answered to her satisfaction. The patient was advised to contact the clinic at [phone] with any questions or concerns prior to her return visit.   I spent 30 minutes assessing and educating the patient.  Raina Mina, RPH-CPP, 04/04/2022  3:24 PM   **Disclaimer: This note was dictated with voice recognition software. Similar sounding words can inadvertently be transcribed and this note may contain transcription errors which may not have been corrected upon publication of note.**

## 2022-04-05 ENCOUNTER — Other Ambulatory Visit (HOSPITAL_COMMUNITY): Payer: Self-pay

## 2022-04-06 ENCOUNTER — Ambulatory Visit (INDEPENDENT_AMBULATORY_CARE_PROVIDER_SITE_OTHER)
Admission: RE | Admit: 2022-04-06 | Discharge: 2022-04-06 | Disposition: A | Discharge status: Home / Self Care | Payer: Medicare Other | Source: Ambulatory Visit | Attending: Vascular Surgery | Admitting: Vascular Surgery

## 2022-04-06 ENCOUNTER — Ambulatory Visit: Payer: Medicare Other | Admitting: Vascular Surgery

## 2022-04-06 ENCOUNTER — Ambulatory Visit (HOSPITAL_COMMUNITY)
Admission: RE | Admit: 2022-04-06 | Discharge: 2022-04-06 | Disposition: A | Discharge status: Home / Self Care | Payer: Medicare Other | Source: Ambulatory Visit | Attending: Vascular Surgery | Admitting: Vascular Surgery

## 2022-04-06 ENCOUNTER — Encounter: Payer: Self-pay | Admitting: Vascular Surgery

## 2022-04-06 VITALS — BP 171/75 | HR 82 | Temp 97.9°F | Resp 20 | Ht 62.0 in | Wt 162.0 lb

## 2022-04-06 DIAGNOSIS — N184 Chronic kidney disease, stage 4 (severe): Secondary | ICD-10-CM

## 2022-04-06 NOTE — H&P (View-Only) (Signed)
ASSESSMENT & PLAN   STAGE V CHRONIC KIDNEY DISEASE: We have been asked to place hemodialysis access.  We have been asked to place a fistula.  If this is not possible we have been asked to wait before placing an AV graft.  Based on her vein map it appears that she would be a good candidate for a right brachiocephalic fistula.  Although she is right-hand dominant this is the best vein available.  She actually uses her left arm more and is perfectly happy with putting the fistula on the right.  I have discussed the indications for placement of an AV fistula.  We have discussed the risks including but not limited to failure of the fistula to mature, the need for subsequent interventions, thrombosis, wound healing problems, and steal syndrome.  All of her questions were answered and she is agreeable to proceed with surgery.  Her surgery is scheduled for 04/24/2021.  REASON FOR CONSULT:    To evaluate for hemodialysis access.  The consult is requested by Dr. Johnney Ou.   HPI:   Rachel Villarreal is a 76 y.o. female who presents for evaluation for hemodialysis access.  She is not on dialysis.  She is right-handed.  We were asked to place an AV fistula.  If we could not place an AV fistula, we were asked to wait on an AV graft.  I have reviewed the records from the referring office.  The patient was seen on 03/29/2022.  The patient has stage V chronic kidney disease.  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  She is referred for evaluation for hemodialysis access.  She does have a history of hypertension, hypercholesterolemia, gout, and anemia associated with chronic renal failure.  On my history, she denies any recent uremic symptoms.  Specifically, she denies nausea, vomiting, fatigue, anorexia, or palpitations.  Past Medical History:  Diagnosis Date   Abnormal Pap smear of vagina    Asthma    Mild   Cancer (HCC)    left breast ILC    Chronic kidney disease (CKD)    stage 4   GERD (gastroesophageal reflux disease)    Gout 05/2012   Hernia, incisional    History of abnormal Pap smear    CIN III, 2001   History of uterine fibroid    Hypertension    Peritonitis ([place])    Respiratory failure ([place])    after ruptured appendix   Ruptured appendix    Seasonal allergies    Shingles 02/2013   very mild case   Urine incontinence     Family History  Problem Relation Age of Onset   Colon cancer Father    Breast cancer Neg Hx     SOCIAL HISTORY: Social History   Tobacco Use   Smoking status: Former    Types: Cigarettes    Quit date: 04/10/1972    Years since quitting: 50.0   Smokeless tobacco: Never  Substance Use Topics   Alcohol use: No    Allergies  Allergen Reactions   Ace Inhibitors     angioedema   Lactose Intolerance (Gi)    Lisinopril Other (See Comments)    unsure   Mercury     Red eyes, through eye drops that contained thimerosal    Tape     plastic    Current Outpatient Medications  Medication Sig Dispense Refill   [START ON 04/11/2022] abemaciclib (VERZENIO) 50 MG tablet Take  1 tablet (50 mg total) by mouth 2 (two) times daily. Swallow tablets whole. Do not chew, crush, or split tablets before swallowing. 56 tablet 3   acetaminophen (TYLENOL) 500 MG tablet Take 500 mg by mouth every 6 (six) hours as needed for moderate pain.     amLODipine (NORVASC) 2.5 MG tablet Take 7.5 mg by mouth daily.     atorvastatin (LIPITOR) 10 MG tablet Take 10 mg by mouth daily.     budesonide (RHINOCORT AQUA) 32 MCG/ACT nasal spray Place 2 sprays into both nostrils daily.     Cholecalciferol (VITAMIN D3) 2000 units TABS Take 2,000 Int'l Units/day by mouth daily.     Coenzyme Q10 (CO Q-10) 200 MG CAPS Take 200 mg by mouth daily.     diclofenac Sodium (VOLTAREN) 1 % GEL Apply 2 g topically 4 (four) times daily as needed (leg pain).     febuxostat (ULORIC) 40 MG tablet Take 40 mg by mouth daily.     letrozole  (FEMARA) 2.5 MG tablet Take 1 tablet (2.5 mg total) by mouth daily. 90 tablet 3   loratadine (CLARITIN) 10 MG tablet Take 5 mg by mouth 2 (two) times daily. Once daily ineffective per patient report     losartan (COZAAR) 100 MG tablet Take 100 mg by mouth daily.     melatonin 5 MG TABS Take 1 tablet (5 mg total) by mouth at bedtime as needed.  0   pantoprazole (PROTONIX) 40 MG tablet Take 40 mg by mouth daily.     PROAIR HFA 108 (90 BASE) MCG/ACT inhaler Inhale 2 puffs into the lungs every 6 (six) hours as needed (for respiratory issues.).      No current facility-administered medications for this visit.    REVIEW OF SYSTEMS:  '[X]'$  denotes positive finding, '[ ]'$  denotes negative finding Cardiac  Comments:  Chest pain or chest pressure:    Shortness of breath upon exertion:    Short of breath when lying flat:    Irregular heart rhythm:        Vascular    Pain in calf, thigh, or hip brought on by ambulation:    Pain in feet at night that wakes you up from your sleep:  x   Blood clot in your veins:    Leg swelling:         Pulmonary    Oxygen at home:    Productive cough:     Wheezing:         Neurologic    Sudden weakness in arms or legs:     Sudden numbness in arms or legs:     Sudden onset of difficulty speaking or slurred speech:    Temporary loss of vision in one eye:     Problems with dizziness:         Gastrointestinal    Blood in stool:     Vomited blood:         Genitourinary    Burning when urinating:     Blood in urine:        Psychiatric    Major depression:         Hematologic    Bleeding problems:    Problems with blood clotting too easily:        Skin    Rashes or ulcers:        Constitutional    Fever or chills:    -  PHYSICAL EXAM:   Vitals:   04/06/22 1337  BP: (!) 171/75  Pulse: 82  Resp: 20  Temp: 97.9 F (36.6 C)  SpO2: 97%  Weight: 162 lb (73.5 kg)  Height: '5\' 2"'$  (1.575 m)   Body mass index is 29.63 kg/m. GENERAL: The patient  is a well-nourished female, in no acute distress. The vital signs are documented above. CARDIAC: There is a regular rate and rhythm.  VASCULAR: I do not detect carotid bruits. She has palpable radial pulses. She has no significant lower extremity swelling. PULMONARY: There is good air exchange bilaterally without wheezing or rales. ABDOMEN: Soft and non-tender with normal pitched bowel sounds.  MUSCULOSKELETAL: There are no major deformities. NEUROLOGIC: No focal weakness or paresthesias are detected. SKIN: There are no ulcers or rashes noted. PSYCHIATRIC: The patient has a normal affect.  DATA:    UPPER EXTREMITY VEIN MAP: I have independently interpreted her upper extremity vein map today.  On the right side the forearm cephalic vein looks small.  The upper arm cephalic vein has diameters ranging from 3.3-5.4 mm.  The basilic vein looks marginal on the right.  On the left side the forearm cephalic vein is not adequate.  The upper arm cephalic vein on the left has diameters ranging from 2.9-3.5 mm.  The basilic vein looks marginal in size.  UPPER EXTREMITY ARTERIAL DUPLEX: I have independently interpreted her upper extremity arterial duplex scan.  On the right side there is a triphasic radial and ulnar waveform.  The brachial artery measures 5.0 mm in diameter.  On the left side, there is a triphasic radial and ulnar waveform.  The brachial artery measures 4.1 mm in diameter.  Deitra Mayo [facility]

## 2022-04-06 NOTE — Progress Notes (Signed)
ASSESSMENT & PLAN   STAGE V CHRONIC KIDNEY DISEASE: We have been asked to place hemodialysis access.  We have been asked to place a fistula.  If this is not possible we have been asked to wait before placing an AV graft.  Based on her vein map it appears that she would be a good candidate for a right brachiocephalic fistula.  Although she is right-hand dominant this is the best vein available.  She actually uses her left arm more and is perfectly happy with putting the fistula on the right.  I have discussed the indications for placement of an AV fistula.  We have discussed the risks including but not limited to failure of the fistula to mature, the need for subsequent interventions, thrombosis, wound healing problems, and steal syndrome.  All of her questions were answered and she is agreeable to proceed with surgery.  Her surgery is scheduled for 04/24/2021.  REASON FOR CONSULT:    To evaluate for hemodialysis access.  The consult is requested by Dr. Johnney Ou.   HPI:   Rachel Villarreal is a 76 y.o. female who presents for evaluation for hemodialysis access.  She is not on dialysis.  She is right-handed.  We were asked to place an AV fistula.  If we could not place an AV fistula, we were asked to wait on an AV graft.  I have reviewed the records from the referring office.  The patient was seen on 03/29/2022.  The patient has stage V chronic kidney disease.  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  She is referred for evaluation for hemodialysis access.  She does have a history of hypertension, hypercholesterolemia, gout, and anemia associated with chronic renal failure.  On my history, she denies any recent uremic symptoms.  Specifically, she denies nausea, vomiting, fatigue, anorexia, or palpitations.  Past Medical History:  Diagnosis Date   Abnormal Pap smear of vagina    Asthma    Mild   Cancer (HCC)    left breast ILC    Chronic kidney disease (CKD)    stage 4   GERD (gastroesophageal reflux disease)    Gout 05/2012   Hernia, incisional    History of abnormal Pap smear    CIN III, 2001   History of uterine fibroid    Hypertension    Peritonitis ([place])    Respiratory failure ([place])    after ruptured appendix   Ruptured appendix    Seasonal allergies    Shingles 02/2013   very mild case   Urine incontinence     Family History  Problem Relation Age of Onset   Colon cancer Father    Breast cancer Neg Hx     SOCIAL HISTORY: Social History   Tobacco Use   Smoking status: Former    Types: Cigarettes    Quit date: 04/10/1972    Years since quitting: 50.0   Smokeless tobacco: Never  Substance Use Topics   Alcohol use: No    Allergies  Allergen Reactions   Ace Inhibitors     angioedema   Lactose Intolerance (Gi)    Lisinopril Other (See Comments)    unsure   Mercury     Red eyes, through eye drops that contained thimerosal    Tape     plastic    Current Outpatient Medications  Medication Sig Dispense Refill   [START ON 04/11/2022] abemaciclib (VERZENIO) 50 MG tablet Take  1 tablet (50 mg total) by mouth 2 (two) times daily. Swallow tablets whole. Do not chew, crush, or split tablets before swallowing. 56 tablet 3   acetaminophen (TYLENOL) 500 MG tablet Take 500 mg by mouth every 6 (six) hours as needed for moderate pain.     amLODipine (NORVASC) 2.5 MG tablet Take 7.5 mg by mouth daily.     atorvastatin (LIPITOR) 10 MG tablet Take 10 mg by mouth daily.     budesonide (RHINOCORT AQUA) 32 MCG/ACT nasal spray Place 2 sprays into both nostrils daily.     Cholecalciferol (VITAMIN D3) 2000 units TABS Take 2,000 Int'l Units/day by mouth daily.     Coenzyme Q10 (CO Q-10) 200 MG CAPS Take 200 mg by mouth daily.     diclofenac Sodium (VOLTAREN) 1 % GEL Apply 2 g topically 4 (four) times daily as needed (leg pain).     febuxostat (ULORIC) 40 MG tablet Take 40 mg by mouth daily.     letrozole  (FEMARA) 2.5 MG tablet Take 1 tablet (2.5 mg total) by mouth daily. 90 tablet 3   loratadine (CLARITIN) 10 MG tablet Take 5 mg by mouth 2 (two) times daily. Once daily ineffective per patient report     losartan (COZAAR) 100 MG tablet Take 100 mg by mouth daily.     melatonin 5 MG TABS Take 1 tablet (5 mg total) by mouth at bedtime as needed.  0   pantoprazole (PROTONIX) 40 MG tablet Take 40 mg by mouth daily.     PROAIR HFA 108 (90 BASE) MCG/ACT inhaler Inhale 2 puffs into the lungs every 6 (six) hours as needed (for respiratory issues.).      No current facility-administered medications for this visit.    REVIEW OF SYSTEMS:  '[X]'$  denotes positive finding, '[ ]'$  denotes negative finding Cardiac  Comments:  Chest pain or chest pressure:    Shortness of breath upon exertion:    Short of breath when lying flat:    Irregular heart rhythm:        Vascular    Pain in calf, thigh, or hip brought on by ambulation:    Pain in feet at night that wakes you up from your sleep:  x   Blood clot in your veins:    Leg swelling:         Pulmonary    Oxygen at home:    Productive cough:     Wheezing:         Neurologic    Sudden weakness in arms or legs:     Sudden numbness in arms or legs:     Sudden onset of difficulty speaking or slurred speech:    Temporary loss of vision in one eye:     Problems with dizziness:         Gastrointestinal    Blood in stool:     Vomited blood:         Genitourinary    Burning when urinating:     Blood in urine:        Psychiatric    Major depression:         Hematologic    Bleeding problems:    Problems with blood clotting too easily:        Skin    Rashes or ulcers:        Constitutional    Fever or chills:    -  PHYSICAL EXAM:   Vitals:   04/06/22 1337  BP: (!) 171/75  Pulse: 82  Resp: 20  Temp: 97.9 F (36.6 C)  SpO2: 97%  Weight: 162 lb (73.5 kg)  Height: '5\' 2"'$  (1.575 m)   Body mass index is 29.63 kg/m. GENERAL: The patient  is a well-nourished female, in no acute distress. The vital signs are documented above. CARDIAC: There is a regular rate and rhythm.  VASCULAR: I do not detect carotid bruits. She has palpable radial pulses. She has no significant lower extremity swelling. PULMONARY: There is good air exchange bilaterally without wheezing or rales. ABDOMEN: Soft and non-tender with normal pitched bowel sounds.  MUSCULOSKELETAL: There are no major deformities. NEUROLOGIC: No focal weakness or paresthesias are detected. SKIN: There are no ulcers or rashes noted. PSYCHIATRIC: The patient has a normal affect.  DATA:    UPPER EXTREMITY VEIN MAP: I have independently interpreted her upper extremity vein map today.  On the right side the forearm cephalic vein looks small.  The upper arm cephalic vein has diameters ranging from 3.3-5.4 mm.  The basilic vein looks marginal on the right.  On the left side the forearm cephalic vein is not adequate.  The upper arm cephalic vein on the left has diameters ranging from 2.9-3.5 mm.  The basilic vein looks marginal in size.  UPPER EXTREMITY ARTERIAL DUPLEX: I have independently interpreted her upper extremity arterial duplex scan.  On the right side there is a triphasic radial and ulnar waveform.  The brachial artery measures 5.0 mm in diameter.  On the left side, there is a triphasic radial and ulnar waveform.  The brachial artery measures 4.1 mm in diameter.  Deitra Mayo [facility]

## 2022-04-07 ENCOUNTER — Telehealth: Payer: Self-pay

## 2022-04-07 ENCOUNTER — Other Ambulatory Visit: Payer: Self-pay

## 2022-04-07 DIAGNOSIS — N184 Chronic kidney disease, stage 4 (severe): Secondary | ICD-10-CM

## 2022-04-07 NOTE — Telephone Encounter (Signed)
-----   Message from Gardenia Phlegm, NP sent at 04/06/2022  4:51 PM EST ----- Is patient seeing nephrology?  If so we need to fax them her labs. ----- Message ----- From: Buel Ream, Lab In [place] Sent: 04/04/2022   2:51 PM EST To: Nicholas Lose, MD

## 2022-04-07 NOTE — Telephone Encounter (Signed)
Called Pt to confirm nephrologist. Pt confirms Dr. Jannifer Hick. Informed Pt that I will be faxing latest lab results. Pt verbalized understanding.  Lab results faxed to [facility]: [phone].

## 2022-04-11 ENCOUNTER — Other Ambulatory Visit (HOSPITAL_COMMUNITY): Payer: Self-pay

## 2022-04-11 ENCOUNTER — Telehealth: Payer: Self-pay

## 2022-04-11 ENCOUNTER — Telehealth: Payer: Self-pay | Admitting: Hematology and Oncology

## 2022-04-11 NOTE — Telephone Encounter (Signed)
Pt called to r/s MD appt d/t surgical procedure 1/15. Message sent to scheduling to accommodate request.

## 2022-04-11 NOTE — Telephone Encounter (Signed)
Pt called and states she had a small reddened area to her right arm, and applied triamincolone cream which helped, but today after her shower she noticed it to her chest. Pt denies itching, burning, heat, pain. She states, "it's just ugly". She has not taken Verzenio in 2 weeks and has not used any new products. Advised pt to try hydrocortisone cream or benadryl cream and if rash hasn't improved or if it has worsened, to call us back. She verbalized thanks and understanding.

## 2022-04-11 NOTE — Telephone Encounter (Signed)
Rescheduled appointments per 1/2 staff message. Patient is aware of the changes made to her upcoming appointments.

## 2022-04-21 ENCOUNTER — Encounter (HOSPITAL_COMMUNITY): Payer: Self-pay | Admitting: Vascular Surgery

## 2022-04-21 ENCOUNTER — Other Ambulatory Visit: Payer: Self-pay

## 2022-04-21 NOTE — Progress Notes (Signed)
Ms Kukuk denies chest pain or shortness of breath.   Patient denies having any s/s of Covid in her household, also denies any known exposure to Covid.   Ms Bilski's PCP is Dr. Wenda Low.  Ms Dant reports that she has had  a rash on arms and legs for 2 weeks, patient reports that she thinks it was caused by stopping antihistamine, she has since started back taking Claritin. Ms Blanke has been using  hydrocortisone cream, she reports that the areas on her arms are almost gone.

## 2022-04-23 NOTE — Anesthesia Preprocedure Evaluation (Signed)
Anesthesia Evaluation  Patient identified by MRN, date of birth, ID band Patient awake    Reviewed: Allergy & Precautions, H&P , NPO status , Patient's Chart, lab work & pertinent test results  Airway Mallampati: II  TM Distance: >3 FB Neck ROM: Full    Dental no notable dental hx. (+) Teeth Intact, Dental Advisory Given   Pulmonary asthma , former smoker   Pulmonary exam normal breath sounds clear to auscultation       Cardiovascular Exercise Tolerance: Good hypertension, Pt. on medications  Rhythm:Regular     Neuro/Psych negative neurological ROS  negative psych ROS   GI/Hepatic Neg liver ROS,GERD  Medicated,,  Endo/Other  negative endocrine ROS    Renal/GU Renal InsufficiencyRenal disease  negative genitourinary   Musculoskeletal   Abdominal   Peds  Hematology  (+) Blood dyscrasia, anemia   Anesthesia Other Findings   Reproductive/Obstetrics negative OB ROS                             Anesthesia Physical Anesthesia Plan  ASA: 3  Anesthesia Plan: MAC   Post-op Pain Management: Tylenol PO (pre-op)*   Induction: Intravenous  PONV Risk Score and Plan: 3 and Propofol infusion, Ondansetron and Treatment may vary due to age or medical condition  Airway Management Planned: Simple Face Mask  Additional Equipment:   Intra-op Plan:   Post-operative Plan:   Informed Consent: I have reviewed the patients History and Physical, chart, labs and discussed the procedure including the risks, benefits and alternatives for the proposed anesthesia with the patient or authorized representative who has indicated his/her understanding and acceptance.     Dental advisory given  Plan Discussed with: CRNA  Anesthesia Plan Comments:        Anesthesia Quick Evaluation

## 2022-04-24 ENCOUNTER — Other Ambulatory Visit: Payer: Self-pay

## 2022-04-24 ENCOUNTER — Ambulatory Visit (HOSPITAL_COMMUNITY): Payer: Medicare Other | Admitting: Anesthesiology

## 2022-04-24 ENCOUNTER — Ambulatory Visit (HOSPITAL_BASED_OUTPATIENT_CLINIC_OR_DEPARTMENT_OTHER): Payer: Medicare Other | Admitting: Anesthesiology

## 2022-04-24 ENCOUNTER — Ambulatory Visit (HOSPITAL_COMMUNITY)
Admission: RE | Admit: 2022-04-24 | Discharge: 2022-04-24 | Disposition: A | Discharge status: Home / Self Care | Payer: Medicare Other | Attending: Vascular Surgery | Admitting: Vascular Surgery

## 2022-04-24 ENCOUNTER — Encounter (HOSPITAL_COMMUNITY)
Admission: RE | Disposition: A | Discharge status: Home / Self Care | Payer: Self-pay | Source: Home / Self Care | Attending: Vascular Surgery

## 2022-04-24 DIAGNOSIS — I120 Hypertensive chronic kidney disease with stage 5 chronic kidney disease or end stage renal disease: Secondary | ICD-10-CM | POA: Diagnosis not present

## 2022-04-24 DIAGNOSIS — Z87891 Personal history of nicotine dependence: Secondary | ICD-10-CM

## 2022-04-24 DIAGNOSIS — N185 Chronic kidney disease, stage 5: Secondary | ICD-10-CM | POA: Insufficient documentation

## 2022-04-24 DIAGNOSIS — D649 Anemia, unspecified: Secondary | ICD-10-CM | POA: Insufficient documentation

## 2022-04-24 DIAGNOSIS — D631 Anemia in chronic kidney disease: Secondary | ICD-10-CM

## 2022-04-24 DIAGNOSIS — N184 Chronic kidney disease, stage 4 (severe): Secondary | ICD-10-CM

## 2022-04-24 DIAGNOSIS — D759 Disease of blood and blood-forming organs, unspecified: Secondary | ICD-10-CM | POA: Diagnosis not present

## 2022-04-24 DIAGNOSIS — J45909 Unspecified asthma, uncomplicated: Secondary | ICD-10-CM | POA: Diagnosis not present

## 2022-04-24 DIAGNOSIS — I129 Hypertensive chronic kidney disease with stage 1 through stage 4 chronic kidney disease, or unspecified chronic kidney disease: Secondary | ICD-10-CM

## 2022-04-24 DIAGNOSIS — K219 Gastro-esophageal reflux disease without esophagitis: Secondary | ICD-10-CM | POA: Insufficient documentation

## 2022-04-24 HISTORY — DX: Anemia, unspecified: D64.9

## 2022-04-24 HISTORY — DX: Personal history of other medical treatment: Z92.89

## 2022-04-24 HISTORY — PX: AV FISTULA PLACEMENT: SHX1204

## 2022-04-24 LAB — POCT I-STAT, CHEM 8
BUN: 62 mg/dL — ABNORMAL HIGH (ref 8–23)
Calcium, Ion: 1.27 mmol/L (ref 1.15–1.40)
Chloride: 115 mmol/L — ABNORMAL HIGH (ref 98–111)
Creatinine, Ser: 5.5 mg/dL — ABNORMAL HIGH (ref 0.44–1.00)
Glucose, Bld: 99 mg/dL (ref 70–99)
HCT: 26 % — ABNORMAL LOW (ref 36.0–46.0)
Hemoglobin: 8.8 g/dL — ABNORMAL LOW (ref 12.0–15.0)
Potassium: 4.4 mmol/L (ref 3.5–5.1)
Sodium: 143 mmol/L (ref 135–145)
TCO2: 16 mmol/L — ABNORMAL LOW (ref 22–32)

## 2022-04-24 SURGERY — ARTERIOVENOUS (AV) FISTULA CREATION
Anesthesia: Monitor Anesthesia Care | Site: Arm Upper | Laterality: Right

## 2022-04-24 MED ORDER — ORAL CARE MOUTH RINSE: 15.0000 mL | Freq: Once | OROMUCOSAL | Status: AC

## 2022-04-24 MED ORDER — PROTAMINE SULFATE 10 MG/ML IV SOLN
INTRAVENOUS | Status: DC | PRN
  Administered 2022-04-24: 40 mg via INTRAVENOUS

## 2022-04-24 MED ORDER — PROPOFOL 1000 MG/100ML IV EMUL
INTRAVENOUS | Status: AC
  Filled 2022-04-24: qty 100

## 2022-04-24 MED ORDER — PHENYLEPHRINE HCL (PRESSORS) 10 MG/ML IV SOLN
INTRAVENOUS | Status: DC | PRN
  Administered 2022-04-24: 160 ug via INTRAVENOUS
  Administered 2022-04-24: 240 ug via INTRAVENOUS
  Administered 2022-04-24 (×2): 160 ug via INTRAVENOUS
  Administered 2022-04-24: 80 ug via INTRAVENOUS

## 2022-04-24 MED ORDER — ONDANSETRON HCL 4 MG/2ML IJ SOLN
INTRAMUSCULAR | Status: DC | PRN
  Administered 2022-04-24: 4 mg via INTRAVENOUS

## 2022-04-24 MED ORDER — OXYCODONE-ACETAMINOPHEN 5-325 MG PO TABS: 1.0000 | ORAL_TABLET | ORAL | 0 refills | Status: DC | PRN

## 2022-04-24 MED ORDER — FENTANYL CITRATE (PF) 250 MCG/5ML IJ SOLN
INTRAMUSCULAR | Status: AC
  Filled 2022-04-24: qty 5

## 2022-04-24 MED ORDER — HEPARIN 6000 UNIT IRRIGATION SOLUTION
Status: AC
  Filled 2022-04-24: qty 500

## 2022-04-24 MED ORDER — FENTANYL CITRATE (PF) 100 MCG/2ML IJ SOLN
INTRAMUSCULAR | Status: AC
  Filled 2022-04-24: qty 2

## 2022-04-24 MED ORDER — FENTANYL CITRATE (PF) 100 MCG/2ML IJ SOLN
25.0000 ug | INTRAMUSCULAR | Status: DC | PRN
  Administered 2022-04-24 (×2): 25 ug via INTRAVENOUS

## 2022-04-24 MED ORDER — ACETAMINOPHEN 500 MG PO TABS
1000.0000 mg | ORAL_TABLET | Freq: Once | ORAL | Status: AC
  Administered 2022-04-24: 1000 mg via ORAL
  Filled 2022-04-24: qty 2

## 2022-04-24 MED ORDER — FENTANYL CITRATE (PF) 250 MCG/5ML IJ SOLN
INTRAMUSCULAR | Status: DC | PRN
  Administered 2022-04-24 (×2): 25 ug via INTRAVENOUS

## 2022-04-24 MED ORDER — CHLORHEXIDINE GLUCONATE 4 % EX LIQD: 60.0000 mL | Freq: Once | CUTANEOUS | Status: DC

## 2022-04-24 MED ORDER — CHLORHEXIDINE GLUCONATE 0.12 % MT SOLN
15.0000 mL | Freq: Once | OROMUCOSAL | Status: AC
  Administered 2022-04-24: 15 mL via OROMUCOSAL
  Filled 2022-04-24: qty 15

## 2022-04-24 MED ORDER — SODIUM CHLORIDE 0.9 % IV SOLN: INTRAVENOUS | Status: DC

## 2022-04-24 MED ORDER — HEPARIN SODIUM (PORCINE) 1000 UNIT/ML IJ SOLN
INTRAMUSCULAR | Status: DC | PRN
  Administered 2022-04-24: 7000 [IU] via INTRAVENOUS

## 2022-04-24 MED ORDER — PROPOFOL 500 MG/50ML IV EMUL
INTRAVENOUS | Status: DC | PRN
  Administered 2022-04-24: 20 ug via INTRAVENOUS
  Administered 2022-04-24: 80 ug/kg/min via INTRAVENOUS

## 2022-04-24 MED ORDER — LACTATED RINGERS IV SOLN: INTRAVENOUS | Status: DC

## 2022-04-24 MED ORDER — LIDOCAINE-EPINEPHRINE (PF) 1 %-1:200000 IJ SOLN
INTRAMUSCULAR | Status: DC | PRN
  Administered 2022-04-24: 5 mL

## 2022-04-24 MED ORDER — LIDOCAINE HCL (PF) 1 % IJ SOLN
INTRAMUSCULAR | Status: AC
  Filled 2022-04-24: qty 30

## 2022-04-24 MED ORDER — 0.9 % SODIUM CHLORIDE (POUR BTL) OPTIME
TOPICAL | Status: DC | PRN
  Administered 2022-04-24: 1000 mL

## 2022-04-24 MED ORDER — LIDOCAINE-EPINEPHRINE (PF) 1 %-1:200000 IJ SOLN
INTRAMUSCULAR | Status: AC
  Filled 2022-04-24: qty 30

## 2022-04-24 MED ORDER — HEPARIN 6000 UNIT IRRIGATION SOLUTION
Status: DC | PRN
  Administered 2022-04-24: 1

## 2022-04-24 MED ORDER — CEFAZOLIN SODIUM-DEXTROSE 2-4 GM/100ML-% IV SOLN
2.0000 g | INTRAVENOUS | Status: AC
  Administered 2022-04-24: 2 g via INTRAVENOUS
  Filled 2022-04-24: qty 100

## 2022-04-24 SURGICAL SUPPLY — 32 items
ADH SKN CLS APL DERMABOND .7 (GAUZE/BANDAGES/DRESSINGS) ×1
ARMBAND PINK RESTRICT EXTREMIT (MISCELLANEOUS) ×2 IMPLANT
BAG COUNTER SPONGE SURGICOUNT (BAG) ×1 IMPLANT
BAG SPNG CNTER NS LX DISP (BAG) ×1
CANISTER SUCT 3000ML PPV (MISCELLANEOUS) ×1 IMPLANT
CANNULA VESSEL 3MM 2 BLNT TIP (CANNULA) ×1 IMPLANT
CLIP VESOCCLUDE MED 6/CT (CLIP) ×1 IMPLANT
CLIP VESOCCLUDE SM WIDE 6/CT (CLIP) ×1 IMPLANT
COVER PROBE W GEL 5X96 (DRAPES) IMPLANT
DERMABOND ADVANCED .7 DNX12 (GAUZE/BANDAGES/DRESSINGS) ×1 IMPLANT
ELECT REM PT RETURN 9FT ADLT (ELECTROSURGICAL) ×1
ELECTRODE REM PT RTRN 9FT ADLT (ELECTROSURGICAL) ×1 IMPLANT
GLOVE BIO SURGEON STRL SZ7.5 (GLOVE) ×1 IMPLANT
GLOVE BIOGEL PI IND STRL 8 (GLOVE) ×1 IMPLANT
GLOVE SURG POLY ORTHO LF SZ7.5 (GLOVE) IMPLANT
GLOVE SURG UNDER LTX SZ8 (GLOVE) ×1 IMPLANT
GOWN STRL REUS W/ TWL LRG LVL3 (GOWN DISPOSABLE) ×3 IMPLANT
GOWN STRL REUS W/TWL LRG LVL3 (GOWN DISPOSABLE) ×3
KIT BASIN OR (CUSTOM PROCEDURE TRAY) ×1 IMPLANT
KIT TURNOVER KIT B (KITS) ×1 IMPLANT
LOOP VESSEL MAXI BLUE (MISCELLANEOUS) IMPLANT
NS IRRIG 1000ML POUR BTL (IV SOLUTION) ×1 IMPLANT
PACK CV ACCESS (CUSTOM PROCEDURE TRAY) ×1 IMPLANT
PAD ARMBOARD 7.5X6 YLW CONV (MISCELLANEOUS) ×2 IMPLANT
SPONGE SURGIFOAM ABS GEL 100 (HEMOSTASIS) IMPLANT
SUT MNCRL AB 4-0 PS2 18 (SUTURE) ×1 IMPLANT
SUT PROLENE 6 0 BV (SUTURE) ×1 IMPLANT
SUT VIC AB 3-0 SH 27 (SUTURE) ×1
SUT VIC AB 3-0 SH 27X BRD (SUTURE) ×1 IMPLANT
TOWEL GREEN STERILE (TOWEL DISPOSABLE) ×1 IMPLANT
UNDERPAD 30X36 HEAVY ABSORB (UNDERPADS AND DIAPERS) ×1 IMPLANT
WATER STERILE IRR 1000ML POUR (IV SOLUTION) ×1 IMPLANT

## 2022-04-24 NOTE — Interval H&P Note (Signed)
History and Physical Interval Note:  04/24/2022 7:18 AM  Rachel Villarreal  has presented today for surgery, with the diagnosis of CKD IV.  The various methods of treatment have been discussed with the patient and family. After consideration of risks, benefits and other options for treatment, the patient has consented to  Procedure(s): RIGHT BRACHIOCEPHALIC ARTERIOVENOUS (AV) FISTULA CREATION (Right) as a surgical intervention.  The patient's history has been reviewed, patient examined, no change in status, stable for surgery.  I have reviewed the patient's chart and labs.  Questions were answered to the patient's satisfaction.     Deitra Mayo

## 2022-04-24 NOTE — Op Note (Signed)
    NAME: Rachel Villarreal    MRN: [id number] DOB: [birth year]    DATE OF OPERATION: 04/24/2022  PREOP DIAGNOSIS:    Stage V chronic kidney disease  POSTOP DIAGNOSIS:    Same  PROCEDURE:    Right brachiocephalic AV fistula  SURGEON: Judeth Cornfield. Scot Dock, MD  ASSIST: Laurence Slate, PA  ANESTHESIA: Local with sedation  EBL: Minimal  INDICATIONS:    Rachel Villarreal is a 77 y.o. female who is not on dialysis.  We were asked to place an AV fistula.  If we were not able to place an AV fistula, we were asked to wait before placing an AV graft.  FINDINGS:   3.5 mm cephalic vein.  Good radial and ulnar signal at the completion of the procedure.  TECHNIQUE:   The patient was taken to the operating room and sedated by anesthesia.  The right arm was prepped and draped in usual sterile fashion.  After the skin was anesthetized with 1% lidocaine a transverse incision was made at the antecubital level.  Here the cephalic vein was dissected free and ligated distally.  It irrigated up nicely with heparinized saline.  It was a 3.5 mm vein.  The patient was heparinized.  The brachial artery was dissected free beneath the fascia.  It was about 4 mm artery.  The artery was clamped proximally and distally and a longitudinal arteriotomy was made.  The vein was sewn into side to the artery using continuous 6-0 Prolene suture.  At the completion there was a good thrill in the fistula.  There was a radial and ulnar signal at the completion of the procedure.  Hemostasis was obtained in the wound.  The heparin was partially reversed with protamine.  The wound was closed with a deep layer of 3-0 Vicryl and the skin closed with 4-0 Monocryl.  Dermabond was applied.  The patient tolerated the procedure well and was transferred to the recovery room in stable condition.  All needle and sponge counts were correct.  Given the complexity of the case,  the assistant was necessary in order to expedient the procedure and  safely perform the technical aspects of the operation.  The assistant provided traction and countertraction to assist with exposure of the artery and vein.  They also assisted with suture ligation of multiple venous branches.  They played a critical role in the anastomosis. These skills, especially following the Prolene suture for the anastomosis, could not have been adequately performed by a scrub tech assistant.    Deitra Mayo, MD, FACS [facility]  DATE OF DICTATION:   04/24/2022

## 2022-04-24 NOTE — Transfer of Care (Signed)
Immediate Anesthesia Transfer of Care Note  Patient: Rachel Villarreal  Procedure(s) Performed: RIGHT BRACHIOCEPHALIC ARTERIOVENOUS (AV) FISTULA CREATION (Right: Arm Upper)  Patient Location: PACU  Anesthesia Type:MAC  Level of Consciousness: awake and drowsy  Airway & Oxygen Therapy: Patient Spontanous Breathing and Patient connected to face mask oxygen  Post-op Assessment: Report given to RN and Post -op Vital signs reviewed and stable  Post vital signs: Reviewed and stable  Last Vitals:  Vitals Value Taken Time  BP    Temp    Pulse    Resp    SpO2      Last Pain:  Vitals:   04/24/22 0641  TempSrc:   PainSc: 0-No pain         Complications: No notable events documented.

## 2022-04-24 NOTE — Anesthesia Postprocedure Evaluation (Signed)
Anesthesia Post Note  Patient: Rachel Villarreal  Procedure(s) Performed: RIGHT BRACHIOCEPHALIC ARTERIOVENOUS (AV) FISTULA CREATION (Right: Arm Upper)     Patient location during evaluation: PACU Anesthesia Type: MAC Level of consciousness: awake and alert Pain management: pain level controlled Vital Signs Assessment: post-procedure vital signs reviewed and stable Respiratory status: spontaneous breathing, nonlabored ventilation and respiratory function stable Cardiovascular status: stable and blood pressure returned to baseline Postop Assessment: no apparent nausea or vomiting Anesthetic complications: no  No notable events documented.  Last Vitals:  Vitals:   04/24/22 0915 04/24/22 0930  BP: 128/62 (!) 146/57  Pulse: 92 91  Resp: 15 14  Temp:    SpO2: 93% 93%    Last Pain:  Vitals:   04/24/22 0930  TempSrc:   PainSc: 4                  Veer Elamin,W. EDMOND

## 2022-04-24 NOTE — Progress Notes (Signed)
Medicated for left hand pain due to IV infiltration hand swollen received to PACU with hand as described

## 2022-04-25 ENCOUNTER — Inpatient Hospital Stay: Payer: Medicare Other

## 2022-04-25 ENCOUNTER — Encounter (HOSPITAL_COMMUNITY): Payer: Self-pay | Admitting: Vascular Surgery

## 2022-04-25 ENCOUNTER — Inpatient Hospital Stay: Payer: Medicare Other | Admitting: Hematology and Oncology

## 2022-04-26 ENCOUNTER — Telehealth: Payer: Self-pay | Admitting: Vascular Surgery

## 2022-04-26 DIAGNOSIS — H401122 Primary open-angle glaucoma, left eye, moderate stage: Secondary | ICD-10-CM | POA: Diagnosis not present

## 2022-04-26 DIAGNOSIS — H353131 Nonexudative age-related macular degeneration, bilateral, early dry stage: Secondary | ICD-10-CM | POA: Diagnosis not present

## 2022-04-26 DIAGNOSIS — H401111 Primary open-angle glaucoma, right eye, mild stage: Secondary | ICD-10-CM | POA: Diagnosis not present

## 2022-04-26 DIAGNOSIS — Z961 Presence of intraocular lens: Secondary | ICD-10-CM | POA: Diagnosis not present

## 2022-04-26 NOTE — Telephone Encounter (Signed)
-----  Message from Angelia Mould, MD sent at 04/24/2022  8:49 AM EST ----- Regarding: charge and f/u  PROCEDURE:   Right brachiocephalic AV fistula  SURGEON: Judeth Cornfield. Scot Dock, MD  ASSIST: Laurence Slate, PA  This patient needs a follow-up visit in approximately 6 weeks on the PA schedule to check on the maturation of her right arm fistula.  She will need a duplex of her fistula at that time.  Thank you.  CD

## 2022-05-01 ENCOUNTER — Other Ambulatory Visit (HOSPITAL_COMMUNITY): Payer: Self-pay

## 2022-05-01 DIAGNOSIS — N185 Chronic kidney disease, stage 5: Secondary | ICD-10-CM | POA: Diagnosis not present

## 2022-05-01 DIAGNOSIS — E7800 Pure hypercholesterolemia, unspecified: Secondary | ICD-10-CM | POA: Diagnosis not present

## 2022-05-01 DIAGNOSIS — M109 Gout, unspecified: Secondary | ICD-10-CM | POA: Diagnosis not present

## 2022-05-01 DIAGNOSIS — I120 Hypertensive chronic kidney disease with stage 5 chronic kidney disease or end stage renal disease: Secondary | ICD-10-CM | POA: Diagnosis not present

## 2022-05-01 DIAGNOSIS — D631 Anemia in chronic kidney disease: Secondary | ICD-10-CM | POA: Diagnosis not present

## 2022-05-02 ENCOUNTER — Other Ambulatory Visit (HOSPITAL_COMMUNITY): Payer: Self-pay

## 2022-05-03 DIAGNOSIS — C50412 Malignant neoplasm of upper-outer quadrant of left female breast: Secondary | ICD-10-CM | POA: Diagnosis not present

## 2022-05-03 DIAGNOSIS — Z170 Estrogen receptor positive status [ER+]: Secondary | ICD-10-CM | POA: Diagnosis not present

## 2022-05-03 NOTE — Assessment & Plan Note (Signed)
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  Multiple hypermetabolic bone lesions involving the spine most notable at L4, focus of hypermetabolism or vaginal cuff, right atrial appendage -------------------------------------------------------------------- Treatment plan: Recommend adding Verzinio to letrozole Verzinio Toxicities:

## 2022-05-04 ENCOUNTER — Inpatient Hospital Stay: Payer: Medicare Other

## 2022-05-04 ENCOUNTER — Other Ambulatory Visit: Payer: Self-pay | Admitting: *Deleted

## 2022-05-04 ENCOUNTER — Inpatient Hospital Stay: Payer: Medicare Other | Attending: Hematology and Oncology

## 2022-05-04 ENCOUNTER — Inpatient Hospital Stay: Payer: Medicare Other | Admitting: Hematology and Oncology

## 2022-05-04 ENCOUNTER — Encounter: Payer: Self-pay | Admitting: *Deleted

## 2022-05-04 VITALS — BP 159/66 | HR 93 | Temp 97.7°F | Resp 17 | Wt 161.9 lb

## 2022-05-04 DIAGNOSIS — Z170 Estrogen receptor positive status [ER+]: Secondary | ICD-10-CM

## 2022-05-04 DIAGNOSIS — C50412 Malignant neoplasm of upper-outer quadrant of left female breast: Secondary | ICD-10-CM | POA: Diagnosis not present

## 2022-05-04 DIAGNOSIS — Z79811 Long term (current) use of aromatase inhibitors: Secondary | ICD-10-CM | POA: Insufficient documentation

## 2022-05-04 LAB — CMP (CANCER CENTER ONLY)
ALT: 5 U/L (ref 0–44)
AST: 13 U/L — ABNORMAL LOW (ref 15–41)
Albumin: 3.7 g/dL (ref 3.5–5.0)
Alkaline Phosphatase: 65 U/L (ref 38–126)
Anion gap: 11 (ref 5–15)
BUN: 66 mg/dL — ABNORMAL HIGH (ref 8–23)
CO2: 18 mmol/L — ABNORMAL LOW (ref 22–32)
Calcium: 9.3 mg/dL (ref 8.9–10.3)
Chloride: 110 mmol/L (ref 98–111)
Creatinine: 5.03 mg/dL (ref 0.44–1.00)
GFR, Estimated: 8 mL/min — ABNORMAL LOW (ref >60)
Glucose, Bld: 95 mg/dL (ref 70–99)
Potassium: 4.6 mmol/L (ref 3.5–5.1)
Sodium: 139 mmol/L (ref 135–145)
Total Bilirubin: 0.4 mg/dL (ref 0.3–1.2)
Total Protein: 6.8 g/dL (ref 6.5–8.1)

## 2022-05-04 LAB — CBC WITH DIFFERENTIAL (CANCER CENTER ONLY)
Abs Immature Granulocytes: 0.08 10*3/uL — ABNORMAL HIGH (ref 0.00–0.07)
Basophils Absolute: 0 10*3/uL (ref 0.0–0.1)
Basophils Relative: 0 %
Eosinophils Absolute: 0.1 10*3/uL (ref 0.0–0.5)
Eosinophils Relative: 2 %
HCT: 24.8 % — ABNORMAL LOW (ref 36.0–46.0)
Hemoglobin: 8.4 g/dL — ABNORMAL LOW (ref 12.0–15.0)
Immature Granulocytes: 1 %
Lymphocytes Relative: 10 %
Lymphs Abs: 0.6 10*3/uL — ABNORMAL LOW (ref 0.7–4.0)
MCH: 30.4 pg (ref 26.0–34.0)
MCHC: 33.9 g/dL (ref 30.0–36.0)
MCV: 89.9 fL (ref 80.0–100.0)
Monocytes Absolute: 0.4 10*3/uL (ref 0.1–1.0)
Monocytes Relative: 6 %
Neutro Abs: 5.1 10*3/uL (ref 1.7–7.7)
Neutrophils Relative %: 81 %
Platelet Count: 175 10*3/uL (ref 150–400)
RBC: 2.76 MIL/uL — ABNORMAL LOW (ref 3.87–5.11)
RDW: 16 % — ABNORMAL HIGH (ref 11.5–15.5)
WBC Count: 6.3 10*3/uL (ref 4.0–10.5)
nRBC: 0 % (ref 0.0–0.2)

## 2022-05-04 LAB — SAMPLE TO BLOOD BANK

## 2022-05-04 LAB — PREPARE RBC (CROSSMATCH)

## 2022-05-04 LAB — ABO/RH: ABO/RH(D): A POS

## 2022-05-04 NOTE — Progress Notes (Signed)
CRITICAL VALUE STICKER  CRITICAL VALUE: crt 5.03  RECEIVER (on-site recipient of call): Merleen Nicely, [place] NOTIFIED: 05/04/22 at 1125   MD NOTIFIED: Nicholas Lose, MD  [place]: 05/04/22 at 1130  RESPONSE:  MD notified and verbalized understanding.  No orders received at this time.

## 2022-05-04 NOTE — Progress Notes (Signed)
Patient Care Team: Nicholas Lose, MD as PCP - General (Hematology and Oncology) [name], MD as Consulting Physician (Gynecology) Mauro Kaufmann, RN as Oncology Nurse Navigator Rockwell Germany, RN as Oncology Nurse Navigator Nicholas Lose, MD as Consulting Physician (Hematology and Oncology) Justin Mend, MD as Attending Physician (Nephrology)  DIAGNOSIS:  Encounter Diagnosis  Name Primary?   Malignant neoplasm of upper-outer quadrant of left breast in female, estrogen receptor positive ([place]) Yes    SUMMARY OF ONCOLOGIC HISTORY: Oncology History  Malignant neoplasm of upper-outer quadrant of left breast in female, estrogen receptor positive ([place])  08/02/2021 Initial Diagnosis   Screening mammogram detected left breast distortion measuring 1.1 cm by ultrasound, negative axillary lymph nodes.  Biopsy revealed grade 2 ILC ER 100%, PR 95%, Ki-67 5%, HER2 IHC 2+, negative ratio 1.29   08/29/2021 Cancer Staging   Staging form: Breast, AJCC 8th Edition - Clinical stage from 08/29/2021: Stage IA (cT1c, cN0, cM0, G2, ER+, PR+, HER2-) - Signed by Nicholas Lose, MD on 08/29/2021 Stage prefix: Initial diagnosis Histologic grading system: 3 grade system     CHIEF COMPLIANT: Follow-up left breast cancer/ letrozole/ Verzino  INTERVAL HISTORY: Rachel Villarreal is a 77 y.o. female is here because of recent diagnosis of left breast cancer. Currently on letrozole. She presents to the clinic today for a follow-up. She reports that most of her side effects is GI. She says she had some mild constipation. She states that her energy levels is unpredictable. Some days she feels good and some days she feel like crap. Overall she is tolerating the Verzinio   ALLERGIES:  is allergic to ace inhibitors, lactose intolerance (gi), lisinopril, mercury, and tape.  MEDICATIONS:  Current Outpatient Medications  Medication Sig Dispense Refill   abemaciclib (VERZENIO) 50 MG tablet Take 1 tablet (50 mg  total) by mouth 2 (two) times daily. Swallow tablets whole. Do not chew, crush, or split tablets before swallowing. 56 tablet 3   acetaminophen (TYLENOL) 500 MG tablet Take 500 mg by mouth every 6 (six) hours as needed for moderate pain.     amLODipine (NORVASC) 2.5 MG tablet Take 7.5 mg by mouth daily. evening     atorvastatin (LIPITOR) 10 MG tablet Take 10 mg by mouth daily.     budesonide (RHINOCORT AQUA) 32 MCG/ACT nasal spray Place 2 sprays into both nostrils daily.     Cholecalciferol (VITAMIN D3) 2000 units TABS Take 2,000 Units by mouth daily.     diclofenac Sodium (VOLTAREN) 1 % GEL Apply 1 Application topically 3 (three) times daily as needed (pain).     febuxostat (ULORIC) 40 MG tablet Take 40 mg by mouth daily.     hydrochlorothiazide (MICROZIDE) 12.5 MG capsule Take 12.5 mg by mouth daily as needed.     hydrocortisone cream 1 % Apply 1 Application topically 2 (two) times daily as needed for itching.     letrozole (FEMARA) 2.5 MG tablet Take 1 tablet (2.5 mg total) by mouth daily. 90 tablet 3   loratadine (CLARITIN) 10 MG tablet Take 10 mg by mouth 2 (two) times daily.     losartan (COZAAR) 100 MG tablet Take 100 mg by mouth daily.     melatonin 5 MG TABS Take 1 tablet (5 mg total) by mouth at bedtime as needed.  0   Multiple Vitamins-Minerals (PRESERVISION AREDS 2+MULTI VIT PO) Take 1 capsule by mouth daily.     oxyCODONE-acetaminophen (PERCOCET) 5-325 MG tablet Take 1-2 tablets by mouth  every 4 (four) hours as needed for severe pain. 20 tablet 0   pantoprazole (PROTONIX) 40 MG tablet Take 40 mg by mouth daily.     PROAIR HFA 108 (90 BASE) MCG/ACT inhaler Inhale 2 puffs into the lungs every 6 (six) hours as needed (for respiratory issues.).      No current facility-administered medications for this visit.    PHYSICAL EXAMINATION: ECOG PERFORMANCE STATUS: 1 - Symptomatic but completely ambulatory  Vitals:   05/04/22 1113  BP: (!) 159/66  Pulse: 93  Resp: 17  Temp: 97.7 F  (36.5 C)  SpO2: 100%   Filed Weights   05/04/22 1113  Weight: 161 lb 14.4 oz (73.4 kg)     LABORATORY DATA:  I have reviewed the data as listed    Latest Ref Rng & Units 05/04/2022   10:34 AM 04/24/2022    6:10 AM 04/04/2022    2:21 PM  CMP  Glucose 70 - 99 mg/dL 95  99  108   BUN 8 - 23 mg/dL 66  62  59   Creatinine 0.44 - 1.00 mg/dL 5.03  5.50  4.23   Sodium 135 - 145 mmol/L 139  143  140   Potassium 3.5 - 5.1 mmol/L 4.6  4.4  4.2   Chloride 98 - 111 mmol/L 110  115  112   CO2 22 - 32 mmol/L 18   18   Calcium 8.9 - 10.3 mg/dL 9.3   9.3   Total Protein 6.5 - 8.1 g/dL 6.8   6.6   Total Bilirubin 0.3 - 1.2 mg/dL 0.4   0.3   Alkaline Phos 38 - 126 U/L 65   64   AST 15 - 41 U/L 13   12   ALT 0 - 44 U/L 5   9     Lab Results  Component Value Date   WBC 6.3 05/04/2022   HGB 8.4 (L) 05/04/2022   HCT 24.8 (L) 05/04/2022   MCV 89.9 05/04/2022   PLT 175 05/04/2022   NEUTROABS 5.1 05/04/2022    ASSESSMENT & PLAN:  Malignant neoplasm of upper-outer quadrant of left breast in female, estrogen receptor positive ([facility]) 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  Multiple hypermetabolic bone lesions involving the spine most notable at L4, focus of hypermetabolism or vaginal cuff, right atrial appendage -------------------------------------------------------------------- Treatment plan:  Verzinio (started 04/11/2022) to letrozole Verzinio Toxicities: Diarrhea: Improved with Imodium.  We had to reduce the dosage to 50 twice daily. Anemia: Will transfuse 1 unit of PRBC.  If it persist then we will consider giving her Retacrit. We will add tumor  markers to her blood work with each time.  I will see her back in 6 weeks.  She sees John in 2 weeks.    Orders Placed This Encounter  Procedures   CA 27.29    Standing Status:   Standing    Number of Occurrences:   100    Standing Expiration Date:   05/05/2023   Cancer antigen 15-3    Standing Status:   Standing    Number of Occurrences:   20    Standing Expiration Date:  05/05/2023   Informed Consent Details: Physician/Practitioner Attestation; Transcribe to consent form and obtain patient signature    Standing Status:   Standing    Number of Occurrences:   1    Order Specific Question:   Physician/Practitioner attestation of informed consent for blood and or blood product transfusion    Answer:   I, the physician/practitioner, attest that I have discussed with the patient the benefits, risks, side effects, alternatives, likelihood of achieving goals and potential problems during recovery for the procedure that I have provided informed consent.    Order Specific Question:   Product(s)    Answer:   All Product(s)   The patient has a good understanding of the overall plan. she agrees with it. she will call with any problems that may develop before the next visit here. Total time spent: 30 mins including face to face time and time spent for planning, charting and co-ordination of care   Harriette Ohara, MD 05/04/22    I Gardiner Coins am acting as a Education administrator for [employer]  I have reviewed the above documentation for accuracy and completeness, and I agree with the above.

## 2022-05-05 ENCOUNTER — Other Ambulatory Visit: Payer: Self-pay

## 2022-05-05 ENCOUNTER — Other Ambulatory Visit (HOSPITAL_COMMUNITY): Payer: Self-pay

## 2022-05-05 LAB — CANCER ANTIGEN 27.29: CA 27.29: 27.1 U/mL (ref 0.0–38.6)

## 2022-05-06 ENCOUNTER — Inpatient Hospital Stay: Payer: Medicare Other

## 2022-05-06 ENCOUNTER — Other Ambulatory Visit (HOSPITAL_COMMUNITY): Payer: Self-pay

## 2022-05-06 DIAGNOSIS — Z79811 Long term (current) use of aromatase inhibitors: Secondary | ICD-10-CM | POA: Diagnosis not present

## 2022-05-06 DIAGNOSIS — Z170 Estrogen receptor positive status [ER+]: Secondary | ICD-10-CM | POA: Diagnosis not present

## 2022-05-06 DIAGNOSIS — C50412 Malignant neoplasm of upper-outer quadrant of left female breast: Secondary | ICD-10-CM | POA: Diagnosis not present

## 2022-05-06 LAB — CANCER ANTIGEN 15-3: CA 15-3: 26 U/mL — ABNORMAL HIGH (ref 0.0–25.0)

## 2022-05-06 MED ORDER — ACETAMINOPHEN 325 MG PO TABS
650.0000 mg | ORAL_TABLET | Freq: Once | ORAL | Status: AC
  Administered 2022-05-06: 650 mg via ORAL
  Filled 2022-05-06: qty 2

## 2022-05-06 MED ORDER — SODIUM CHLORIDE 0.9% IV SOLUTION
250.0000 mL | Freq: Once | INTRAVENOUS | Status: AC
  Administered 2022-05-06: 250 mL via INTRAVENOUS

## 2022-05-06 MED ORDER — DIPHENHYDRAMINE HCL 25 MG PO CAPS
25.0000 mg | ORAL_CAPSULE | Freq: Once | ORAL | Status: AC
  Administered 2022-05-06: 25 mg via ORAL
  Filled 2022-05-06: qty 1

## 2022-05-06 NOTE — Patient Instructions (Signed)

## 2022-05-08 LAB — BPAM RBC
Blood Product Expiration Date: 202402202359
ISSUE DATE / TIME: 202401270855
Unit Type and Rh: 6200

## 2022-05-08 LAB — TYPE AND SCREEN
ABO/RH(D): A POS
Antibody Screen: NEGATIVE
Unit division: 0

## 2022-05-16 ENCOUNTER — Encounter: Payer: Self-pay | Admitting: *Deleted

## 2022-05-16 ENCOUNTER — Inpatient Hospital Stay: Payer: Medicare Other | Attending: Hematology and Oncology | Admitting: Pharmacist

## 2022-05-16 ENCOUNTER — Inpatient Hospital Stay: Payer: Medicare Other

## 2022-05-16 VITALS — BP 174/73 | HR 89 | Temp 97.3°F | Resp 18 | Wt 158.8 lb

## 2022-05-16 DIAGNOSIS — Z170 Estrogen receptor positive status [ER+]: Secondary | ICD-10-CM

## 2022-05-16 DIAGNOSIS — Z79811 Long term (current) use of aromatase inhibitors: Secondary | ICD-10-CM | POA: Insufficient documentation

## 2022-05-16 DIAGNOSIS — C50412 Malignant neoplasm of upper-outer quadrant of left female breast: Secondary | ICD-10-CM | POA: Insufficient documentation

## 2022-05-16 LAB — CMP (CANCER CENTER ONLY)
ALT: 8 U/L (ref 0–44)
AST: 13 U/L — ABNORMAL LOW (ref 15–41)
Albumin: 3.8 g/dL (ref 3.5–5.0)
Alkaline Phosphatase: 61 U/L (ref 38–126)
Anion gap: 10 (ref 5–15)
BUN: 51 mg/dL — ABNORMAL HIGH (ref 8–23)
CO2: 18 mmol/L — ABNORMAL LOW (ref 22–32)
Calcium: 8.8 mg/dL — ABNORMAL LOW (ref 8.9–10.3)
Chloride: 113 mmol/L — ABNORMAL HIGH (ref 98–111)
Creatinine: 4.17 mg/dL (ref 0.44–1.00)
GFR, Estimated: 10 mL/min — ABNORMAL LOW (ref >60)
Glucose, Bld: 116 mg/dL — ABNORMAL HIGH (ref 70–99)
Potassium: 4.5 mmol/L (ref 3.5–5.1)
Sodium: 141 mmol/L (ref 135–145)
Total Bilirubin: 0.4 mg/dL (ref 0.3–1.2)
Total Protein: 6.2 g/dL — ABNORMAL LOW (ref 6.5–8.1)

## 2022-05-16 LAB — CBC WITH DIFFERENTIAL (CANCER CENTER ONLY)
Abs Immature Granulocytes: 0.07 10*3/uL (ref 0.00–0.07)
Basophils Absolute: 0 10*3/uL (ref 0.0–0.1)
Basophils Relative: 0 %
Eosinophils Absolute: 0.1 10*3/uL (ref 0.0–0.5)
Eosinophils Relative: 3 %
HCT: 27.2 % — ABNORMAL LOW (ref 36.0–46.0)
Hemoglobin: 9.3 g/dL — ABNORMAL LOW (ref 12.0–15.0)
Immature Granulocytes: 1 %
Lymphocytes Relative: 15 %
Lymphs Abs: 0.8 10*3/uL (ref 0.7–4.0)
MCH: 31.4 pg (ref 26.0–34.0)
MCHC: 34.2 g/dL (ref 30.0–36.0)
MCV: 91.9 fL (ref 80.0–100.0)
Monocytes Absolute: 0.3 10*3/uL (ref 0.1–1.0)
Monocytes Relative: 6 %
Neutro Abs: 4 10*3/uL (ref 1.7–7.7)
Neutrophils Relative %: 75 %
Platelet Count: 197 10*3/uL (ref 150–400)
RBC: 2.96 MIL/uL — ABNORMAL LOW (ref 3.87–5.11)
RDW: 15.9 % — ABNORMAL HIGH (ref 11.5–15.5)
WBC Count: 5.3 10*3/uL (ref 4.0–10.5)
nRBC: 0 % (ref 0.0–0.2)

## 2022-05-16 NOTE — Progress Notes (Signed)
CRITICAL VALUE STICKER  CRITICAL VALUE: Crt 4.17  RECEIVER (on-site recipient of call): Merleen Nicely, [place] NOTIFIED: 05/16/22 at 23  MD NOTIFIED: Nicholas Lose, MD  TIME OF NOTIFICATION:05/16/22 at 1520  RESPONSE:  MD notified and verbalized understanding.  No orders received at this time.

## 2022-05-16 NOTE — Progress Notes (Signed)
[place]       Telephone: [phone]?Fax: [phone]   Oncology Clinical Pharmacist Practitioner Progress Note  Rachel Villarreal was contacted via in-person to discuss her chemotherapy regimen for abemaciclib which they receive under the care of Dr. Nicholas Lose.   Current treatment regimen and start date Abemaciclib (03/13/22) 50 mg BID started ~04/20/22 100 mg BID started 03/14/23 Letrozole (12/09/21)   Interval History She continues on abemaciclib 50 mg by mouth every 12 hours on days 1 to 28 of a 28-day cycle. This is being given in combination with letrozole Therapy is planned to continue until disease progression or unacceptable toxicity.  Rachel Villarreal was seen today as a follow-up to her abemaciclib management.  She last saw Dr. Lindi Villarreal on 05/04/22 and clinical pharmacy on 04/05/23. She did have a blood transfusion at her last visit with Dr. Lindi Villarreal and she feels it helped the fatigue.  Response to Therapy She is tolerating the lower dose of abemaciclib much better. Her serum creatinine remains elevated but is decreasing. Dr. Geralyn Villarreal nurse notified clinical pharmacy of the value. She has known CKD and sees Nephrology every 6 weeks. Baseline Scr estimated at 3.88 mg/dL on 11/28/22. Her BP was elevated today in clinic but at home she reports systolic more variable 650'P to 160's. Diastolic usually 54-65K per her report. She continues to be followed by PCP for blood pressure management. She is taking HCTZ still only when edema gets worse. She states nephrology felt this to be reasonable.  She had tried to reduce loratadine to daily dosing which is recommended but states she broke out in  a rash. So she went back up to BID dosing and rash resolved. Her tumor markers are not back yet today but will report back tomorrow directly to Dr. Lindi Villarreal.   Her main side effect currently is diarrhea. She reports this happening about every 3 or 4 days and 4-5 a day when it occurs. She is  drinking plenty of fluids and using loperamide after the few episodes. She only likes to take 1 tablet because 2 tablets constipates her. She is having some SOB on exertion which could be due to her not exercising for some time. However, we explained that rarely it could be ILD. We advised her to closely monitor and contact Dr. Geralyn Villarreal clinic should it get any worse. She will see Dr. Lindi Villarreal in 1 month and then clinical pharmacy in 2 months. After that, may consider every other month visits if she continues to tolerate abemaciclib well. Labs, vitals, treatment parameters, and manufacturer guidelines assessing toxicity were reviewed with Rachel Villarreal today. Based on these values, patient is in agreement to continue abemaciclib therapy at this time.  Allergies Allergies  Allergen Reactions   Ace Inhibitors     angioedema   Lactose Intolerance (Gi)    Lisinopril Swelling   Mercury     Red eyes, through eye drops that contained thimerosal    Tape Dermatitis    plastic    Vitals    05/16/2022    3:00 PM 05/06/2022   11:00 AM 05/06/2022    9:25 AM  Oncology Vitals  Weight 72.031 kg    Weight (lbs) 158 lbs 13 oz    BMI 29.04 kg/m2   29.04 kg/m2    Temp 97.3 F (36.3 C) 97.8 F (36.6 C) 98.1 F (36.7 C)  Pulse Rate 89 91 90  BP 174/73 161/61 143/55  Resp '18 18 18  '$ SpO2  99 % 100 % 99 %  BSA (m2) 1.77 m2   1.77 m2      Laboratory Data    Latest Ref Rng & Units 05/16/2022    2:11 PM 05/04/2022   10:34 AM 04/24/2022    6:10 AM  CBC EXTENDED  WBC 4.0 - 10.5 K/uL 5.3  6.3    RBC 3.87 - 5.11 MIL/uL 2.96  2.76    Hemoglobin 12.0 - 15.0 g/dL 9.3  8.4  8.8   HCT 36.0 - 46.0 % 27.2  24.8  26.0   Platelets 150 - 400 K/uL 197  175    NEUT# 1.7 - 7.7 K/uL 4.0  5.1    Lymph# 0.7 - 4.0 K/uL 0.8  0.6         Latest Ref Rng & Units 05/16/2022    2:11 PM 05/04/2022   10:34 AM 04/24/2022    6:10 AM  CMP  Glucose 70 - 99 mg/dL 116  95  99   BUN 8 - 23 mg/dL 51  66  62   Creatinine 0.44 - 1.00  mg/dL 4.17  5.03  5.50   Sodium 135 - 145 mmol/L 141  139  143   Potassium 3.5 - 5.1 mmol/L 4.5  4.6  4.4   Chloride 98 - 111 mmol/L 113  110  115   CO2 22 - 32 mmol/L 18  18    Calcium 8.9 - 10.3 mg/dL 8.8  9.3    Total Protein 6.5 - 8.1 g/dL 6.2  6.8    Total Bilirubin 0.3 - 1.2 mg/dL 0.4  0.4    Alkaline Phos 38 - 126 U/L 61  65    AST 15 - 41 U/L 13  13    ALT 0 - 44 U/L 8  5      No results found for: "MG" Lab Results  Component Value Date   CA2729 27.1 05/04/2022     Adverse Effects Assessment Diarrhea: using loperamide, 1 tablet after a few episodes. Two tablets has constipated her in the past. Serum creatinine: she has CKD. Trending down but will continue to monitor closely Calcium: corrected WNL (~8.96 mg/dL) SOB on exertion only. No other symptoms: she will try to start exercising again but closely monitor for worsening symptoms. Pneumonitis is always possible. Closely monitor.  Adherence Assessment ZEFFIE BICKERT reports missing 0 doses over the past 2 weeks.   Reason for missed dose: n/a Patient was re-educated on importance of adherence.   Access Assessment Rachel Villarreal is currently receiving her abemaciclib through [facility] concerns:  none  Medication Reconciliation The patient's medication list was reviewed today with the patient? Yes New medications or herbal supplements have recently been started? No  Any medications have been discontinued? No  The medication list was updated and reconciled based on the patient's most recent medication list in the electronic medical record (EMR) including herbal products and OTC medications.   Medications Current Outpatient Medications  Medication Sig Dispense Refill   abemaciclib (VERZENIO) 50 MG tablet Take 1 tablet (50 mg total) by mouth 2 (two) times daily. Swallow tablets whole. Do not chew, crush, or split tablets before swallowing. 56 tablet 3   acetaminophen (TYLENOL) 500 MG  tablet Take 500 mg by mouth every 6 (six) hours as needed for moderate pain.     amLODipine (NORVASC) 2.5 MG tablet Take 7.5 mg by mouth daily. evening     atorvastatin (LIPITOR) 10 MG  tablet Take 10 mg by mouth daily.     budesonide (RHINOCORT AQUA) 32 MCG/ACT nasal spray Place 2 sprays into both nostrils daily.     Cholecalciferol (VITAMIN D3) 2000 units TABS Take 2,000 Units by mouth daily.     diclofenac Sodium (VOLTAREN) 1 % GEL Apply 1 Application topically 3 (three) times daily as needed (pain).     febuxostat (ULORIC) 40 MG tablet Take 40 mg by mouth daily.     hydrochlorothiazide (MICROZIDE) 12.5 MG capsule Take 12.5 mg by mouth daily as needed.     hydrocortisone cream 1 % Apply 1 Application topically 2 (two) times daily as needed for itching.     letrozole (FEMARA) 2.5 MG tablet Take 1 tablet (2.5 mg total) by mouth daily. 90 tablet 3   loratadine (CLARITIN) 10 MG tablet Take 10 mg by mouth 2 (two) times daily.     losartan (COZAAR) 100 MG tablet Take 100 mg by mouth daily.     melatonin 5 MG TABS Take 1 tablet (5 mg total) by mouth at bedtime as needed.  0   Multiple Vitamins-Minerals (PRESERVISION AREDS 2+MULTI VIT PO) Take 1 capsule by mouth daily.     oxyCODONE-acetaminophen (PERCOCET) 5-325 MG tablet Take 1-2 tablets by mouth every 4 (four) hours as needed for severe pain. 20 tablet 0   pantoprazole (PROTONIX) 40 MG tablet Take 40 mg by mouth daily.     PROAIR HFA 108 (90 BASE) MCG/ACT inhaler Inhale 2 puffs into the lungs every 6 (six) hours as needed (for respiratory issues.).      No current facility-administered medications for this visit.    Drug-Drug Interactions (DDIs) DDIs were evaluated? Yes Significant DDIs?  Have discussed loratadine in past but unable to stop BID dosing per patient due to rash that occurs. The patient was instructed to speak with their health care provider and/or the oral chemotherapy pharmacist before starting any new drug, including prescription  or over the counter, natural / herbal products, or vitamins.  Supportive Care Diarrhea: we reviewed that diarrhea is common with abemaciclib and confirmed that she does have loperamide (Imodium) at home.  We reviewed how to take this medication PRN. Neutropenia: we discussed the importance of having a thermometer and what the Centers for Disease Control and Prevention (CDC) considers a fever which is 100.27F (38C) or higher.  Gave patient 24/7 triage line to call if any fevers or symptoms. ILD/Pneumonitis: we reviewed potential symptoms including cough, shortness, and fatigue.  VTE: reviewed signs of DVT such as leg swelling, redness, pain, or tenderness and signs of PE such as shortness of breath, rapid or irregular heartbeat, cough, chest pain, or lightheadedness. Reviewed to take the medication every 12 hours (with food sometimes can be easier on the stomach) and to take it at the same time every day.  Dosing Assessment Hepatic adjustments needed? No  Renal adjustments needed? No , baseline Scr ~ 3.88 mg/dL on 11/27/21 Toxicity adjustments needed? No  The current dosing regimen is appropriate to continue at this time.  Follow-Up Plan Continue abemaciclib 50 mg by mouth every 12 hours Continue letrozole 2.5 mg by mouth daily Continue loperamide as needed for diarrhea Monitor shortness of breath, Scr, and Hgb Labs, Dr. Lindi Villarreal visit on 06/16/22. Dr. Lindi Villarreal may order restaging scans at that time. CT CAP last done 02/06/22 and PET 02/10/22. Will add labs, pharmacy clinic visit for 07/13/22 Tumor marker labs not back yet but will likely result tomorrow. Last values were baseline.  Rachel Villarreal participated in the discussion, expressed understanding, and voiced agreement with the above plan. All questions were answered to her satisfaction. The patient was advised to contact the clinic at [phone] with any questions or concerns prior to her return visit.   I spent 30 minutes assessing and  educating the patient.  Raina Mina, RPH-CPP, 05/16/2022  3:35 PM   **Disclaimer: This note was dictated with voice recognition software. Similar sounding words can inadvertently be transcribed and this note may contain transcription errors which may not have been corrected upon publication of note.**

## 2022-05-17 ENCOUNTER — Telehealth: Payer: Self-pay | Admitting: Pharmacist

## 2022-05-17 LAB — CANCER ANTIGEN 27.29: CA 27.29: 22.8 U/mL (ref 0.0–38.6)

## 2022-05-17 NOTE — Telephone Encounter (Signed)
Scheduled appointment per 2/6 los. Patient is aware.

## 2022-05-18 LAB — CANCER ANTIGEN 15-3: CA 15-3: 24.7 U/mL (ref 0.0–25.0)

## 2022-05-19 ENCOUNTER — Other Ambulatory Visit: Payer: Self-pay | Admitting: *Deleted

## 2022-05-19 DIAGNOSIS — N184 Chronic kidney disease, stage 4 (severe): Secondary | ICD-10-CM

## 2022-05-20 ENCOUNTER — Emergency Department (HOSPITAL_BASED_OUTPATIENT_CLINIC_OR_DEPARTMENT_OTHER)
Admission: EM | Admit: 2022-05-20 | Discharge: 2022-05-20 | Disposition: A | Discharge status: Home / Self Care | Payer: Medicare Other | Attending: Emergency Medicine | Admitting: Emergency Medicine

## 2022-05-20 ENCOUNTER — Other Ambulatory Visit: Payer: Self-pay

## 2022-05-20 DIAGNOSIS — K649 Unspecified hemorrhoids: Secondary | ICD-10-CM

## 2022-05-20 DIAGNOSIS — Z853 Personal history of malignant neoplasm of breast: Secondary | ICD-10-CM | POA: Insufficient documentation

## 2022-05-20 DIAGNOSIS — N184 Chronic kidney disease, stage 4 (severe): Secondary | ICD-10-CM | POA: Insufficient documentation

## 2022-05-20 DIAGNOSIS — K625 Hemorrhage of anus and rectum: Secondary | ICD-10-CM | POA: Insufficient documentation

## 2022-05-20 DIAGNOSIS — K645 Perianal venous thrombosis: Secondary | ICD-10-CM | POA: Insufficient documentation

## 2022-05-20 DIAGNOSIS — I129 Hypertensive chronic kidney disease with stage 1 through stage 4 chronic kidney disease, or unspecified chronic kidney disease: Secondary | ICD-10-CM | POA: Diagnosis not present

## 2022-05-20 LAB — CBC
HCT: 28.9 % — ABNORMAL LOW (ref 36.0–46.0)
Hemoglobin: 9.6 g/dL — ABNORMAL LOW (ref 12.0–15.0)
MCH: 30 pg (ref 26.0–34.0)
MCHC: 33.2 g/dL (ref 30.0–36.0)
MCV: 90.3 fL (ref 80.0–100.0)
Platelets: 221 10*3/uL (ref 150–400)
RBC: 3.2 MIL/uL — ABNORMAL LOW (ref 3.87–5.11)
RDW: 15.9 % — ABNORMAL HIGH (ref 11.5–15.5)
WBC: 7 10*3/uL (ref 4.0–10.5)
nRBC: 0 % (ref 0.0–0.2)

## 2022-05-20 LAB — COMPREHENSIVE METABOLIC PANEL
ALT: 9 U/L (ref 0–44)
AST: 12 U/L — ABNORMAL LOW (ref 15–41)
Albumin: 4.5 g/dL (ref 3.5–5.0)
Alkaline Phosphatase: 65 U/L (ref 38–126)
Anion gap: 14 (ref 5–15)
BUN: 58 mg/dL — ABNORMAL HIGH (ref 8–23)
CO2: 16 mmol/L — ABNORMAL LOW (ref 22–32)
Calcium: 9.3 mg/dL (ref 8.9–10.3)
Chloride: 110 mmol/L (ref 98–111)
Creatinine, Ser: 4.69 mg/dL — ABNORMAL HIGH (ref 0.44–1.00)
GFR, Estimated: 9 mL/min — ABNORMAL LOW (ref >60)
Glucose, Bld: 95 mg/dL (ref 70–99)
Potassium: 4.7 mmol/L (ref 3.5–5.1)
Sodium: 140 mmol/L (ref 135–145)
Total Bilirubin: 0.4 mg/dL (ref 0.3–1.2)
Total Protein: 7.5 g/dL (ref 6.5–8.1)

## 2022-05-20 LAB — OCCULT BLOOD X 1 CARD TO LAB, STOOL: Fecal Occult Bld: POSITIVE — AB

## 2022-05-20 NOTE — ED Triage Notes (Signed)
Pt from home c/o of bright red blood with a tiny little clot after a bowel movement this afternoon. Pt has hx of renal condition.

## 2022-05-20 NOTE — Discharge Instructions (Signed)
As we discussed I suspect he may have either a small diverticular bleed or external or internal hemorrhoid that is having a small amount of bleeding, does not seem that you are having high-volume bleeding on my exam, considering you are not taking a blood thinner should taper off and stop on its own, you may want to try some stool softeners and nifedipine or Preparation H to treat any possible hemorrhoid, overall I just want you to monitor to see if the bleeding increases or persists, at which point I would seek further evaluation and treatment, at this time however all of your lab work is stable compared to your baseline and I would not recommend any further treatment.

## 2022-05-20 NOTE — ED Provider Notes (Signed)
[facility] Provider Note   CSN: [id number] Arrival date & time: 05/20/22  1751     History  Chief Complaint  Patient presents with   Rectal Bleeding    Rachel Villarreal is a 77 y.o. female who presents with concern for rectal bleeding since tonight.  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  She denies any previous history of significant constipation, hemorrhoids, endorses some blood on her depends and small passage of clot into the toilet tonight.  She denies any abdominal pain, fever, chills, fatigue.  She reports that she has been anemic, and the last had to get a unit of blood last week, with hemoglobin 9.7 around 1 week ago.   Rectal Bleeding      Home Medications Prior to Admission medications   Medication Sig Start Date End Date Taking? Authorizing Provider  abemaciclib (VERZENIO) 50 MG tablet Take 1 tablet (50 mg total) by mouth 2 (two) times daily. Swallow tablets whole. Do not chew, crush, or split tablets before swallowing. 04/11/22   Nicholas Lose, MD  acetaminophen (TYLENOL) 500 MG tablet Take 500 mg by mouth every 6 (six) hours as needed for moderate pain.    [provider]  amLODipine (NORVASC) 2.5 MG tablet Take 7.5 mg by mouth daily. evening 07/27/21   [provider]  atorvastatin (LIPITOR) 10 MG tablet Take 10 mg by mouth daily.    [provider]  budesonide (RHINOCORT AQUA) 32 MCG/ACT nasal spray Place 2 sprays into both nostrils daily.    [provider]  Cholecalciferol (VITAMIN D3) 2000 units TABS Take 2,000 Units by mouth daily.    [provider]  diclofenac Sodium (VOLTAREN) 1 %  GEL Apply 1 Application topically 3 (three) times daily as needed (pain).    [provider]  febuxostat (ULORIC) 40 MG tablet Take 40 mg by mouth daily.    [provider]  hydrochlorothiazide (MICROZIDE) 12.5 MG capsule Take 12.5 mg by mouth daily as needed.    [provider]  hydrocortisone cream 1 % Apply 1 Application topically 2 (two) times daily as needed for itching.    [provider]  letrozole (FEMARA) 2.5 MG tablet Take 1 tablet (2.5 mg total) by mouth daily. 12/09/21   Nicholas Lose, MD  loratadine (CLARITIN) 10 MG tablet Take 10 mg by mouth 2 (two) times daily.    [provider]  losartan (COZAAR) 100 MG tablet Take 100 mg by mouth daily. 07/27/21   [provider]  melatonin 5 MG TABS Take 1 tablet (5 mg total) by mouth at bedtime as needed. 08/29/21   Nicholas Lose, MD  Multiple Vitamins-Minerals (PRESERVISION AREDS 2+MULTI VIT PO) Take 1 capsule by mouth daily.    [provider]  oxyCODONE-acetaminophen (PERCOCET) 5-325 MG tablet Take 1-2 tablets by mouth every 4 (four) hours as needed for severe pain. 04/24/22 04/24/23  Angelia Mould, MD  pantoprazole (PROTONIX) 40 MG tablet Take 40 mg by mouth daily.    [provider]  PROAIR HFA 108 (90 BASE) MCG/ACT inhaler Inhale 2 puffs into the lungs every 6 (six) hours as needed (for respiratory issues.).  12/03/14   [provider]  Allergies    Ace inhibitors, Lactose intolerance (gi), Lisinopril, Mercury, and Tape    Review of Systems   Review of Systems  Gastrointestinal:  Positive for hematochezia.  All other systems reviewed and are negative.   Physical Exam Updated Vital Signs BP (!) 168/67 (BP Location: Right Arm)   Pulse (!) 101   Temp 97.8 F (36.6 C)   Resp 18   Ht 5' 2"$  (1.575 m)   Wt 71.7 kg   SpO2 100%   BMI 28.90 kg/m  Physical Exam Vitals and nursing note reviewed.  Constitutional:      General: She is not in acute  distress.    Appearance: Normal appearance.  HENT:     Head: Normocephalic and atraumatic.  Eyes:     General:        Right eye: No discharge.        Left eye: No discharge.  Cardiovascular:     Rate and Rhythm: Normal rate and regular rhythm.     Heart sounds: Normal heart sounds.  Pulmonary:     Effort: Pulmonary effort is normal. No respiratory distress.  Genitourinary:    Comments: Patient does have some bright red blood at the external rectum, as well as a small thrombosed hemorrhoid.  No active bleeding at time of my evaluation.  Soft brown stool in rectal vault mixed with bright red blood. Musculoskeletal:        General: No deformity.  Skin:    General: Skin is warm and dry.  Neurological:     Mental Status: She is alert and oriented to person, place, and time.  Psychiatric:        Mood and Affect: Mood normal.        Behavior: Behavior normal.     ED Results / Procedures / Treatments   Labs (all labs ordered are listed, but only abnormal results are displayed) Labs Reviewed  COMPREHENSIVE METABOLIC PANEL - Abnormal; Notable for the following components:      Result Value   CO2 16 (*)    BUN 58 (*)    Creatinine, Ser 4.69 (*)    AST 12 (*)    GFR, Estimated 9 (*)    All other components within normal limits  CBC - Abnormal; Notable for the following components:   RBC 3.20 (*)    Hemoglobin 9.6 (*)    HCT 28.9 (*)    RDW 15.9 (*)    All other components within normal limits  OCCULT BLOOD X 1 CARD TO LAB, STOOL - Abnormal; Notable for the following components:   Fecal Occult Bld POSITIVE (*)    All other components within normal limits  POC OCCULT BLOOD, ED    EKG None  Radiology No results found.  Procedures Procedures    Medications Ordered in ED Medications - No data to display  ED Course/ Medical Decision Making/ A&P                              Medical Decision Making Amount and/or Complexity of Data Reviewed Labs: ordered.   This  patient is a 77 y.o. female  who presents to the ED for concern of bright red blood at rectum with small clot after bowel movement this afternoon..   Differential diagnoses prior to evaluation: The emergent differential diagnosis includes, but is not limited to, hemorrhoids, diverticular disease, upper, lower GI bleed, perforated colon, colon cancer, rectal trauma,  anal fissure, versus other. This is not an exhaustive differential.   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 Pertinent results include: Reviewed outpatient oncology, vascular surgery notes, lab work, imaging from recent previous admissions  Physical Exam: Physical exam performed. The pertinent findings include: Patient with some bright red blood at rectal vault, with no active bleeding noted at this time.  I do palpate a small thrombosed hemorrhoid.  No evidence of rectal fissure.  Lab Tests/Imaging studies: I personally interpreted labs/imaging and the pertinent results include: CBC notable for hemoglobin stable to mildly improved compared to recent previous, her Hemoccult is positive with red blood on exam.  CMP with overall stable kidney function, BUN, creatinine are similar compared to her baseline from recent lab work.   Disposition: After consideration of the diagnostic results and the patients response to treatment, I feel that patient with rectal bleeding either from diverticular disease or external hemorrhoid most likely, with no significant active bleeding at time my exam, encouraged watching and waiting, stool softener as needed, nifedipine, preparation H, and close PCP follow up.   emergency department workup does not suggest an emergent condition requiring admission or immediate intervention beyond what has been performed at this time. The plan is: as  above. The patient is safe for discharge and has been instructed to return immediately for worsening symptoms, change in symptoms or any other concerns.  Final Clinical Impression(s) / ED Diagnoses Final diagnoses:  Rectal bleeding  Hemorrhoids, unspecified hemorrhoid type    Rx / DC Orders ED Discharge Orders     None         Anselmo Pickler, PA-C 05/20/22 2058    Lorelle Gibbs, DO 05/21/22 0006

## 2022-05-20 NOTE — ED Notes (Signed)
Pt has fistula in right arm band placed on same.pt also had lumpectomy on her rleft side in June 2023. Pt states they have been using right arm for blood if necessary

## 2022-05-29 ENCOUNTER — Telehealth: Payer: Self-pay | Admitting: Hematology and Oncology

## 2022-05-29 ENCOUNTER — Other Ambulatory Visit: Payer: Self-pay

## 2022-05-29 NOTE — Telephone Encounter (Signed)
Rescheduled appointments per room/resource. Patient is aware of the changes made to her upcoming appointments.

## 2022-05-31 ENCOUNTER — Other Ambulatory Visit (HOSPITAL_COMMUNITY): Payer: Self-pay

## 2022-06-08 ENCOUNTER — Encounter: Payer: Self-pay | Admitting: Physician Assistant

## 2022-06-08 ENCOUNTER — Ambulatory Visit (INDEPENDENT_AMBULATORY_CARE_PROVIDER_SITE_OTHER): Payer: Medicare Other | Admitting: Physician Assistant

## 2022-06-08 ENCOUNTER — Ambulatory Visit (HOSPITAL_COMMUNITY)
Admission: RE | Admit: 2022-06-08 | Discharge: 2022-06-08 | Disposition: A | Discharge status: Home / Self Care | Payer: Medicare Other | Source: Ambulatory Visit | Attending: Vascular Surgery | Admitting: Vascular Surgery

## 2022-06-08 VITALS — BP 158/72 | HR 93 | Temp 98.1°F | Resp 20 | Ht 62.0 in | Wt 156.1 lb

## 2022-06-08 DIAGNOSIS — N184 Chronic kidney disease, stage 4 (severe): Secondary | ICD-10-CM | POA: Diagnosis not present

## 2022-06-08 NOTE — Progress Notes (Signed)
POST OPERATIVE OFFICE NOTE    CC:  F/u for surgery  HPI:  This is a 77 y.o. female who is not on HD and has CKD V s/p right BC AV fistula on 04/24/22 by Dr. Scot Dock.    Pt returns today for follow up.  Pt states she has no loss of sensation, motor and no pain.  She has been on medication to treat her breast cancer and this caused an elevation in her Cr.  She states it is decreasing from a max of 5.55 to 4.69 mg/dl.     Allergies  Allergen Reactions   Ace Inhibitors     angioedema   Lactose Intolerance (Gi)    Lisinopril Swelling   Mercury     Red eyes, through eye drops that contained thimerosal    Tape Dermatitis    plastic    Current Outpatient Medications  Medication Sig Dispense Refill   abemaciclib (VERZENIO) 50 MG tablet Take 1 tablet (50 mg total) by mouth 2 (two) times daily. Swallow tablets whole. Do not chew, crush, or split tablets before swallowing. 56 tablet 3   acetaminophen (TYLENOL) 500 MG tablet Take 500 mg by mouth every 6 (six) hours as needed for moderate pain.     amLODipine (NORVASC) 2.5 MG tablet Take 7.5 mg by mouth daily. evening     atorvastatin (LIPITOR) 10 MG tablet Take 10 mg by mouth daily.     budesonide (RHINOCORT AQUA) 32 MCG/ACT nasal spray Place 2 sprays into both nostrils daily.     Cholecalciferol (VITAMIN D3) 2000 units TABS Take 2,000 Units by mouth daily.     diclofenac Sodium (VOLTAREN) 1 % GEL Apply 1 Application topically 3 (three) times daily as needed (pain).     febuxostat (ULORIC) 40 MG tablet Take 40 mg by mouth daily.     hydrochlorothiazide (MICROZIDE) 12.5 MG capsule Take 12.5 mg by mouth daily as needed.     hydrocortisone cream 1 % Apply 1 Application topically 2 (two) times daily as needed for itching.     letrozole (FEMARA) 2.5 MG tablet Take 1 tablet (2.5 mg total) by mouth daily. 90 tablet 3   loratadine (CLARITIN) 10 MG tablet Take 10 mg by mouth 2 (two) times daily.     losartan (COZAAR) 100 MG tablet Take 100 mg by  mouth daily.     melatonin 5 MG TABS Take 1 tablet (5 mg total) by mouth at bedtime as needed.  0   Multiple Vitamins-Minerals (PRESERVISION AREDS 2+MULTI VIT PO) Take 1 capsule by mouth daily.     pantoprazole (PROTONIX) 40 MG tablet Take 40 mg by mouth daily.     PROAIR HFA 108 (90 BASE) MCG/ACT inhaler Inhale 2 puffs into the lungs every 6 (six) hours as needed (for respiratory issues.).      No current facility-administered medications for this visit.     ROS:  See HPI  Physical Exam:  Findings:  +--------------------+----------+-----------------+--------+  AVF                PSV (cm/s)Flow Vol (mL/min)Comments  +--------------------+----------+-----------------+--------+  Native artery inflow   317          1864                 +--------------------+----------+-----------------+--------+  AVF Anastomosis        675                     Stenosis  +--------------------+----------+-----------------+--------+     +------------+----------+-------------+----------+--------+  OUTFLOW VEINPSV (cm/s)Diameter (cm)Depth (cm)Describe  +------------+----------+-------------+----------+--------+  Prox UA        310        0.56        0.93             +------------+----------+-------------+----------+--------+  Mid UA         187        0.46        0.50             +------------+----------+-------------+----------+--------+  Dist UA        770        0.65        0.52             +------------+----------+-------------+----------+--------+        Summary:  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    *See table(s) above for measurements and observations.    Diagnosing physician: Deitra Mayo MD  Electronically signed by Deitra Mayo MD on 06/08/2022 at 1:36:08  PM.      Incision:  well healed  Extremities:  palpable thrill in fistula with palpable radial pulse and grip  5/5     Assessment/Plan: This is a 77 y.o. female who is not on HD and has CKD V s/p right BC AV fistula on 04/24/22 by Dr. Scot Dock.    She will exercise her right UE to help with maturity.  The diameter is 0.5-0.52 and the depth is < 0.6 cm.  She will return in 3-4 weeks for repeat duplex and maturity check.  If it fails to mature she may need a fistulagram pending her HD needs.  We will wait due to contrast use and her Cr levels.     Roxy Horseman PA-C [facility] [phone]   Clinic MD:  Scot Dock

## 2022-06-09 ENCOUNTER — Other Ambulatory Visit: Payer: Self-pay

## 2022-06-09 DIAGNOSIS — N184 Chronic kidney disease, stage 4 (severe): Secondary | ICD-10-CM

## 2022-06-09 IMAGING — DX MM BREAST SURGICAL SPECIMEN
1 series · 2 of 2 positions shown · non-contrast
Comparison: Previous exams.

CLINICAL DATA: Status post lumpectomy today after earlier
radioactive seed localization.

EXAM:
SPECIMEN RADIOGRAPH OF THE LEFT BREAST

[Series 2: specimen digital x-ray, derived · left · 0.07mm/px · 2 of 2 slices shown]
[im 1/2]
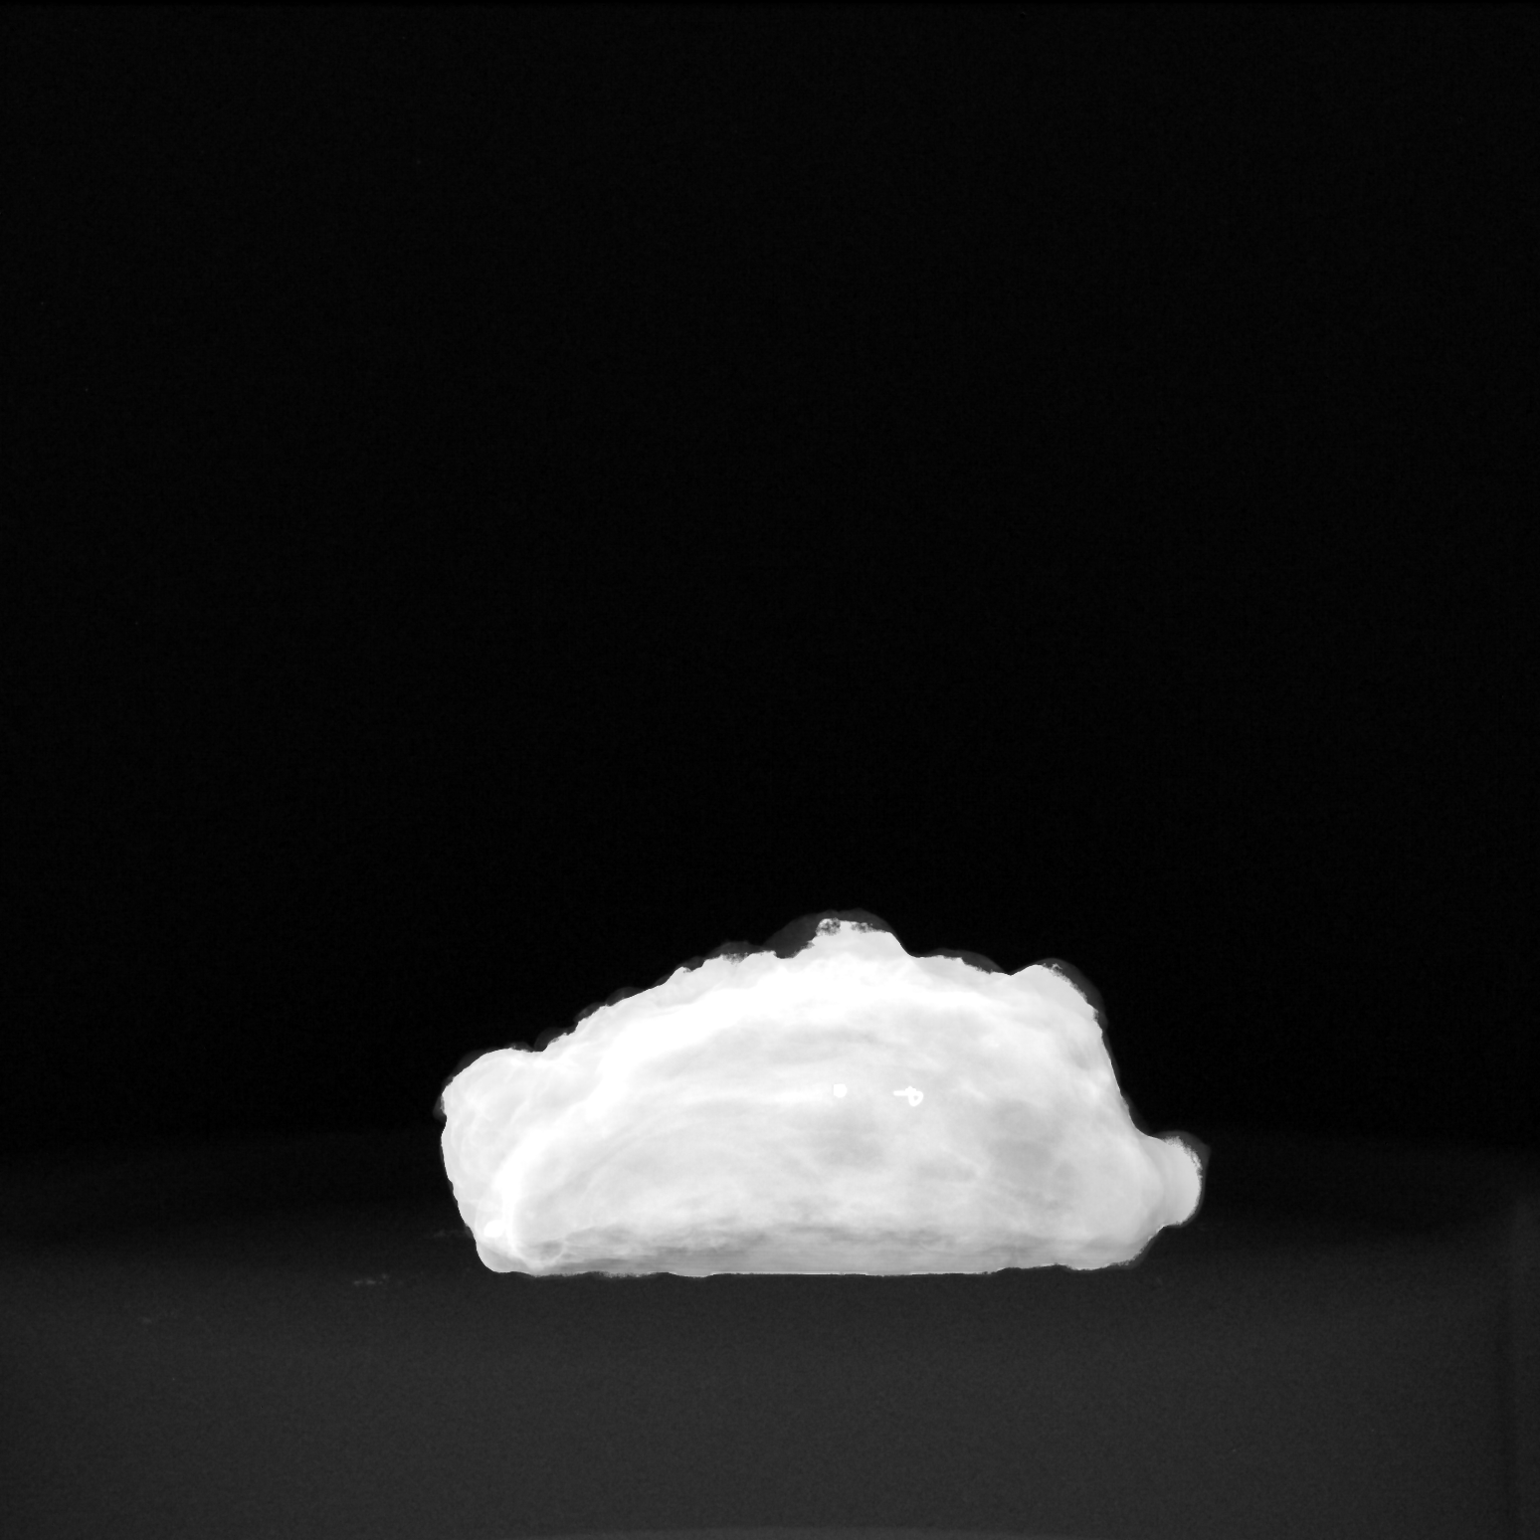
[im 2/2]
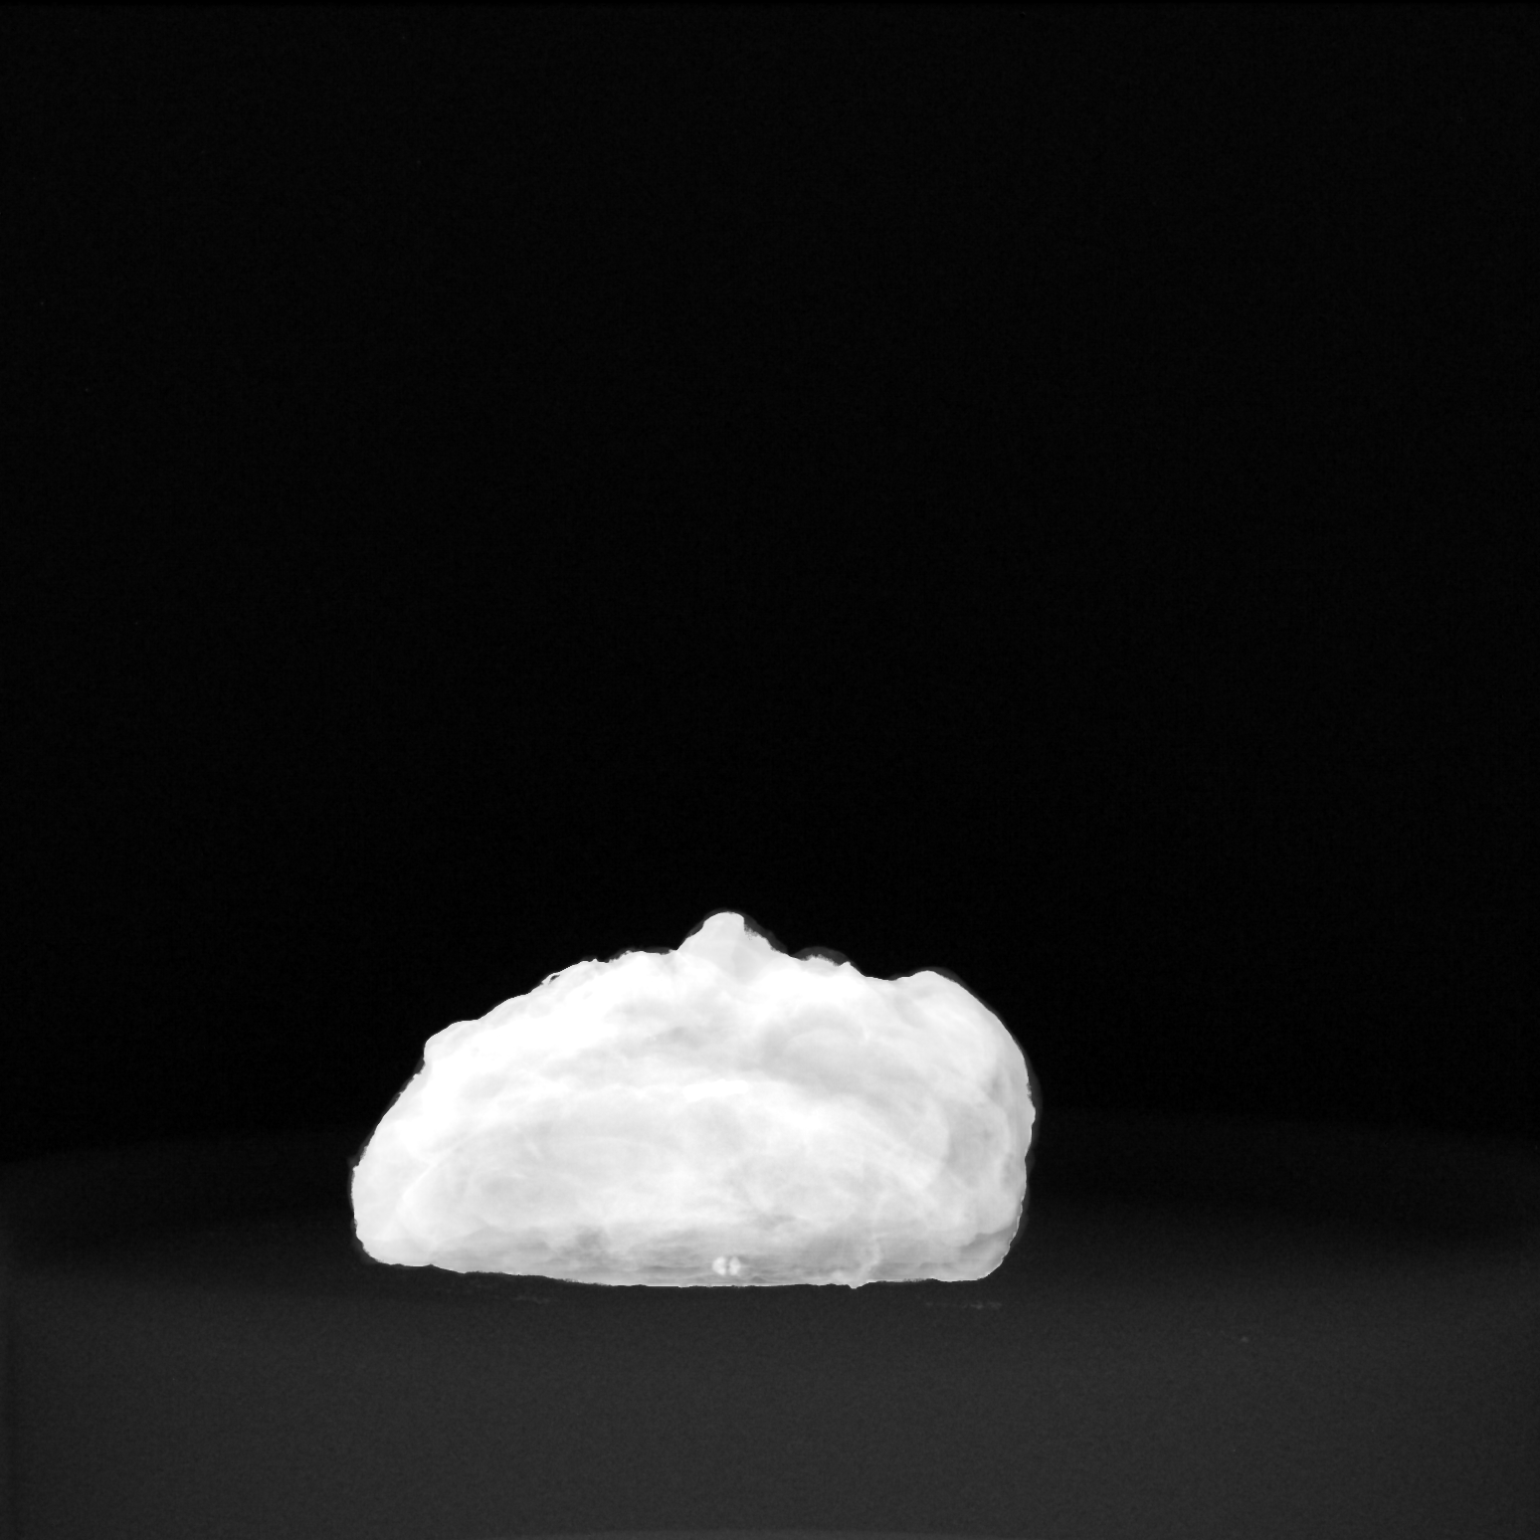

[2 of 2 positions shown; findings below may reference images not displayed]

FINDINGS: Status post excision of the left breast. The radioactive seed and
biopsy marker clip are present and appear completely intact within
the specimen. Findings discussed with the OR staff during the
procedure.
IMPRESSION: Specimen radiograph of the left breast.

## 2022-06-13 DIAGNOSIS — N185 Chronic kidney disease, stage 5: Secondary | ICD-10-CM | POA: Diagnosis not present

## 2022-06-13 DIAGNOSIS — E7800 Pure hypercholesterolemia, unspecified: Secondary | ICD-10-CM | POA: Diagnosis not present

## 2022-06-13 DIAGNOSIS — E872 Acidosis, unspecified: Secondary | ICD-10-CM | POA: Diagnosis not present

## 2022-06-13 DIAGNOSIS — D631 Anemia in chronic kidney disease: Secondary | ICD-10-CM | POA: Diagnosis not present

## 2022-06-13 DIAGNOSIS — I120 Hypertensive chronic kidney disease with stage 5 chronic kidney disease or end stage renal disease: Secondary | ICD-10-CM | POA: Diagnosis not present

## 2022-06-13 DIAGNOSIS — M109 Gout, unspecified: Secondary | ICD-10-CM | POA: Diagnosis not present

## 2022-06-13 NOTE — Progress Notes (Signed)
Patient Care Team: Nicholas Lose, MD as PCP - General (Hematology and Oncology) [name], MD as Consulting Physician (Gynecology) Mauro Kaufmann, RN as Oncology Nurse Navigator Rockwell Germany, RN as Oncology Nurse Navigator Nicholas Lose, MD as Consulting Physician (Hematology and Oncology) Justin Mend, MD as Attending Physician (Nephrology)  DIAGNOSIS:  Encounter Diagnosis  Name Primary?   Malignant neoplasm of upper-outer quadrant of left breast in female, estrogen receptor positive ([place]) Yes    SUMMARY OF ONCOLOGIC HISTORY: Oncology History  Malignant neoplasm of upper-outer quadrant of left breast in female, estrogen receptor positive ([place])  08/02/2021 Initial Diagnosis   Screening mammogram detected left breast distortion measuring 1.1 cm by ultrasound, negative axillary lymph nodes.  Biopsy revealed grade 2 ILC ER 100%, PR 95%, Ki-67 5%, HER2 IHC 2+, negative ratio 1.29   08/29/2021 Cancer Staging   Staging form: Breast, AJCC 8th Edition - Clinical stage from 08/29/2021: Stage IA (cT1c, cN0, cM0, G2, ER+, PR+, HER2-) - Signed by Nicholas Lose, MD on 08/29/2021 Stage prefix: Initial diagnosis Histologic grading system: 3 grade system     CHIEF COMPLIANT: Follow-up on letrozole  INTERVAL HISTORY: Rachel Villarreal is a  77 y.o. female is here because of recent diagnosis of left breast cancer. She presents to the clinic today for a follow-up. She reports that she is doing good on the verzinio. She does have some mild diarrhea, but it is controllable. She does take imodium when she has to leave the house. She does have some fatigue.    ALLERGIES:  is allergic to ace inhibitors, lactose intolerance (gi), lisinopril, mercury, and tape.  MEDICATIONS:  Current Outpatient Medications  Medication Sig Dispense Refill   abemaciclib (VERZENIO) 50 MG tablet Take 1 tablet (50 mg total) by mouth 2 (two) times daily. Swallow tablets whole. Do not chew, crush, or split tablets  before swallowing. 56 tablet 3   acetaminophen (TYLENOL) 500 MG tablet Take 500 mg by mouth every 6 (six) hours as needed for moderate pain.     amLODipine (NORVASC) 2.5 MG tablet Take 7.5 mg by mouth daily. evening     atorvastatin (LIPITOR) 10 MG tablet Take 10 mg by mouth daily.     budesonide (RHINOCORT AQUA) 32 MCG/ACT nasal spray Place 2 sprays into both nostrils daily.     Cholecalciferol (VITAMIN D3) 2000 units TABS Take 2,000 Units by mouth daily.     diclofenac Sodium (VOLTAREN) 1 % GEL Apply 1 Application topically 3 (three) times daily as needed (pain).     febuxostat (ULORIC) 40 MG tablet Take 40 mg by mouth daily.     hydrochlorothiazide (MICROZIDE) 12.5 MG capsule Take 12.5 mg by mouth daily as needed.     hydrocortisone cream 1 % Apply 1 Application topically 2 (two) times daily as needed for itching.     letrozole (FEMARA) 2.5 MG tablet Take 1 tablet (2.5 mg total) by mouth daily. 90 tablet 3   loratadine (CLARITIN) 10 MG tablet Take 10 mg by mouth 2 (two) times daily.     losartan (COZAAR) 100 MG tablet Take 100 mg by mouth daily.     melatonin 5 MG TABS Take 1 tablet (5 mg total) by mouth at bedtime as needed.  0   Multiple Vitamins-Minerals (PRESERVISION AREDS 2+MULTI VIT PO) Take 1 capsule by mouth daily.     pantoprazole (PROTONIX) 40 MG tablet Take 40 mg by mouth daily.     PROAIR HFA 108 (90 BASE) MCG/ACT  inhaler Inhale 2 puffs into the lungs every 6 (six) hours as needed (for respiratory issues.).      No current facility-administered medications for this visit.    PHYSICAL EXAMINATION: ECOG PERFORMANCE STATUS: 1 - Symptomatic but completely ambulatory  Vitals:   06/16/22 1150  BP: (!) 147/63  Pulse: 87  Resp: 19  Temp: 97.7 F (36.5 C)  SpO2: 100%   Filed Weights   06/16/22 1150  Weight: 157 lb 14.4 oz (71.6 kg)      LABORATORY DATA:  I have reviewed the data as listed    Latest Ref Rng & Units 06/16/2022   11:36 AM 05/20/2022    7:49 PM 05/16/2022     2:11 PM  CMP  Glucose 70 - 99 mg/dL 111  95  116   BUN 8 - 23 mg/dL 64  58  51   Creatinine 0.44 - 1.00 mg/dL 4.66  4.69  4.17   Sodium 135 - 145 mmol/L 140  140  141   Potassium 3.5 - 5.1 mmol/L 4.5  4.7  4.5   Chloride 98 - 111 mmol/L 113  110  113   CO2 22 - 32 mmol/L '17  16  18   '$ Calcium 8.9 - 10.3 mg/dL 8.7  9.3  8.8   Total Protein 6.5 - 8.1 g/dL 7.0  7.5  6.2   Total Bilirubin 0.3 - 1.2 mg/dL 0.3  0.4  0.4   Alkaline Phos 38 - 126 U/L 64  65  61   AST 15 - 41 U/L '14  12  13   '$ ALT 0 - 44 U/L '10  9  8     '$ Lab Results  Component Value Date   WBC 6.2 06/16/2022   HGB 8.8 (L) 06/16/2022   HCT 25.4 (L) 06/16/2022   MCV 91.0 06/16/2022   PLT 207 06/16/2022   NEUTROABS 4.7 06/16/2022    ASSESSMENT & PLAN:  Malignant neoplasm of upper-outer quadrant of left breast in female, estrogen receptor positive ([facility]) Left Lumpectomy: 1.1 cm Grade 1 ILC , Positive Posterior margin, ER 100%, PR 95%, Ki-67 5%, HER2 IHC 2+, negative ratio 1.29 Margin excision 09/29/2021: Benign  Left lower quadrant abdominal pain: CT abdomen 02/07/2022: Right lower quadrant mesenteric mass, 5 mm right lung base nodule   PET CT scan 02/10/2022: 2.7 cm mesenteric mass is hypermetabolic consistent with neoplastic process.  Multiple hypermetabolic bone lesions involving the spine most notable at L4, focus of hypermetabolism or vaginal cuff, right atrial appendage -------------------------------------------------------------------- Treatment plan:  Verzinio (started 04/11/2022) to letrozole Verzinio Toxicities: Diarrhea: Improved with Imodium.  We had to reduce the dosage to 50 twice daily. Anemia: Will transfuse 1 unit of PRBC.  If it persist then we will consider giving her Retacrit. We will add tumor markers to her blood work with each time.  Return to clinic in 1 month with scans done ahead of time and follow-up    Orders Placed This Encounter  Procedures   CT CHEST ABDOMEN PELVIS WO CONTRAST    Standing  Status:   Future    Standing Expiration Date:   06/16/2023    Order Specific Question:   Preferred imaging location?    Answer:   [facility]   The patient has a good understanding of the overall plan. she agrees with it. she will call with any problems that may develop before the next visit here. Total time spent: 30 mins including face to face time and time spent  for planning, charting and co-ordination of care   Harriette Ohara, MD 06/16/22    I Gardiner Coins am acting as a Education administrator for Dr.Dalante Minus  I have reviewed the above documentation for accuracy and completeness, and I agree with the above.

## 2022-06-16 ENCOUNTER — Inpatient Hospital Stay: Payer: Medicare Other | Attending: Hematology and Oncology

## 2022-06-16 ENCOUNTER — Inpatient Hospital Stay (HOSPITAL_BASED_OUTPATIENT_CLINIC_OR_DEPARTMENT_OTHER): Payer: Medicare Other | Admitting: Hematology and Oncology

## 2022-06-16 ENCOUNTER — Other Ambulatory Visit: Payer: Medicare Other

## 2022-06-16 ENCOUNTER — Ambulatory Visit: Payer: Medicare Other | Admitting: Hematology and Oncology

## 2022-06-16 VITALS — BP 147/63 | HR 87 | Temp 97.7°F | Resp 19 | Ht 62.0 in | Wt 157.9 lb

## 2022-06-16 DIAGNOSIS — Z170 Estrogen receptor positive status [ER+]: Secondary | ICD-10-CM

## 2022-06-16 DIAGNOSIS — C50412 Malignant neoplasm of upper-outer quadrant of left female breast: Secondary | ICD-10-CM | POA: Diagnosis not present

## 2022-06-16 DIAGNOSIS — Z79811 Long term (current) use of aromatase inhibitors: Secondary | ICD-10-CM | POA: Insufficient documentation

## 2022-06-16 LAB — CBC WITH DIFFERENTIAL (CANCER CENTER ONLY)
Abs Immature Granulocytes: 0.07 10*3/uL (ref 0.00–0.07)
Basophils Absolute: 0 10*3/uL (ref 0.0–0.1)
Basophils Relative: 0 %
Eosinophils Absolute: 0.2 10*3/uL (ref 0.0–0.5)
Eosinophils Relative: 2 %
HCT: 25.4 % — ABNORMAL LOW (ref 36.0–46.0)
Hemoglobin: 8.8 g/dL — ABNORMAL LOW (ref 12.0–15.0)
Immature Granulocytes: 1 %
Lymphocytes Relative: 16 %
Lymphs Abs: 1 10*3/uL (ref 0.7–4.0)
MCH: 31.5 pg (ref 26.0–34.0)
MCHC: 34.6 g/dL (ref 30.0–36.0)
MCV: 91 fL (ref 80.0–100.0)
Monocytes Absolute: 0.3 10*3/uL (ref 0.1–1.0)
Monocytes Relative: 6 %
Neutro Abs: 4.7 10*3/uL (ref 1.7–7.7)
Neutrophils Relative %: 75 %
Platelet Count: 207 10*3/uL (ref 150–400)
RBC: 2.79 MIL/uL — ABNORMAL LOW (ref 3.87–5.11)
RDW: 15.8 % — ABNORMAL HIGH (ref 11.5–15.5)
WBC Count: 6.2 10*3/uL (ref 4.0–10.5)
nRBC: 0 % (ref 0.0–0.2)

## 2022-06-16 LAB — CMP (CANCER CENTER ONLY)
ALT: 10 U/L (ref 0–44)
AST: 14 U/L — ABNORMAL LOW (ref 15–41)
Albumin: 4.2 g/dL (ref 3.5–5.0)
Alkaline Phosphatase: 64 U/L (ref 38–126)
Anion gap: 10 (ref 5–15)
BUN: 64 mg/dL — ABNORMAL HIGH (ref 8–23)
CO2: 17 mmol/L — ABNORMAL LOW (ref 22–32)
Calcium: 8.7 mg/dL — ABNORMAL LOW (ref 8.9–10.3)
Chloride: 113 mmol/L — ABNORMAL HIGH (ref 98–111)
Creatinine: 4.66 mg/dL — ABNORMAL HIGH (ref 0.44–1.00)
GFR, Estimated: 9 mL/min — ABNORMAL LOW (ref >60)
Glucose, Bld: 111 mg/dL — ABNORMAL HIGH (ref 70–99)
Potassium: 4.5 mmol/L (ref 3.5–5.1)
Sodium: 140 mmol/L (ref 135–145)
Total Bilirubin: 0.3 mg/dL (ref 0.3–1.2)
Total Protein: 7 g/dL (ref 6.5–8.1)

## 2022-06-16 NOTE — Assessment & Plan Note (Signed)
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  Multiple hypermetabolic bone lesions involving the spine most notable at L4, focus of hypermetabolism or vaginal cuff, right atrial appendage -------------------------------------------------------------------- Treatment plan:  Verzinio (started 04/11/2022) to letrozole Verzinio Toxicities: Diarrhea: Improved with Imodium.  We had to reduce the dosage to 50 twice daily. Anemia: Will transfuse 1 unit of PRBC.  If it persist then we will consider giving her Retacrit. We will add tumor markers to her blood work with each time.  Return to clinic in 1 month with scans done ahead of time and follow-up

## 2022-06-17 LAB — CANCER ANTIGEN 27.29: CA 27.29: 33.1 U/mL (ref 0.0–38.6)

## 2022-06-18 LAB — CANCER ANTIGEN 15-3: CA 15-3: 26.9 U/mL — ABNORMAL HIGH (ref 0.0–25.0)

## 2022-06-21 ENCOUNTER — Telehealth: Payer: Self-pay | Admitting: Pharmacist

## 2022-06-21 NOTE — Telephone Encounter (Signed)
Rescheduled appointments per 3/13 secure chat per Jenny Reichmann due to leadership meeting. Left voicemail for patient.

## 2022-06-27 ENCOUNTER — Other Ambulatory Visit (HOSPITAL_COMMUNITY): Payer: Self-pay

## 2022-06-28 ENCOUNTER — Other Ambulatory Visit (HOSPITAL_COMMUNITY): Payer: Self-pay

## 2022-06-29 DIAGNOSIS — L578 Other skin changes due to chronic exposure to nonionizing radiation: Secondary | ICD-10-CM | POA: Diagnosis not present

## 2022-06-29 DIAGNOSIS — D485 Neoplasm of uncertain behavior of skin: Secondary | ICD-10-CM | POA: Diagnosis not present

## 2022-06-29 DIAGNOSIS — D225 Melanocytic nevi of trunk: Secondary | ICD-10-CM | POA: Diagnosis not present

## 2022-06-29 DIAGNOSIS — L821 Other seborrheic keratosis: Secondary | ICD-10-CM | POA: Diagnosis not present

## 2022-06-29 DIAGNOSIS — L304 Erythema intertrigo: Secondary | ICD-10-CM | POA: Diagnosis not present

## 2022-06-29 DIAGNOSIS — L814 Other melanin hyperpigmentation: Secondary | ICD-10-CM | POA: Diagnosis not present

## 2022-06-29 DIAGNOSIS — D2271 Melanocytic nevi of right lower limb, including hip: Secondary | ICD-10-CM | POA: Diagnosis not present

## 2022-06-30 ENCOUNTER — Other Ambulatory Visit (HOSPITAL_COMMUNITY): Payer: Self-pay

## 2022-06-30 ENCOUNTER — Other Ambulatory Visit: Payer: Self-pay

## 2022-07-06 ENCOUNTER — Encounter (HOSPITAL_COMMUNITY): Payer: Medicare Other

## 2022-07-06 ENCOUNTER — Ambulatory Visit: Payer: Medicare Other

## 2022-07-10 ENCOUNTER — Encounter (HOSPITAL_COMMUNITY): Payer: Self-pay

## 2022-07-10 ENCOUNTER — Ambulatory Visit (HOSPITAL_COMMUNITY)
Admission: RE | Admit: 2022-07-10 | Discharge: 2022-07-10 | Disposition: A | Discharge status: Home / Self Care | Payer: Medicare Other | Source: Ambulatory Visit | Attending: Hematology and Oncology | Admitting: Hematology and Oncology

## 2022-07-10 DIAGNOSIS — C50412 Malignant neoplasm of upper-outer quadrant of left female breast: Secondary | ICD-10-CM | POA: Diagnosis not present

## 2022-07-10 DIAGNOSIS — R918 Other nonspecific abnormal finding of lung field: Secondary | ICD-10-CM | POA: Diagnosis not present

## 2022-07-10 DIAGNOSIS — K5730 Diverticulosis of large intestine without perforation or abscess without bleeding: Secondary | ICD-10-CM | POA: Diagnosis not present

## 2022-07-10 DIAGNOSIS — Z170 Estrogen receptor positive status [ER+]: Secondary | ICD-10-CM | POA: Diagnosis not present

## 2022-07-10 DIAGNOSIS — K8020 Calculus of gallbladder without cholecystitis without obstruction: Secondary | ICD-10-CM | POA: Diagnosis not present

## 2022-07-13 ENCOUNTER — Other Ambulatory Visit: Payer: Medicare Other

## 2022-07-13 ENCOUNTER — Inpatient Hospital Stay: Payer: Medicare Other

## 2022-07-13 ENCOUNTER — Inpatient Hospital Stay: Payer: Medicare Other | Admitting: Pharmacist

## 2022-07-13 ENCOUNTER — Other Ambulatory Visit: Payer: Self-pay | Admitting: *Deleted

## 2022-07-13 ENCOUNTER — Ambulatory Visit: Payer: Medicare Other | Admitting: Pharmacist

## 2022-07-13 ENCOUNTER — Inpatient Hospital Stay: Payer: Medicare Other | Attending: Hematology and Oncology

## 2022-07-13 ENCOUNTER — Other Ambulatory Visit: Payer: Self-pay

## 2022-07-13 VITALS — BP 165/54 | HR 99 | Temp 97.5°F | Resp 18 | Ht 62.0 in | Wt 157.0 lb

## 2022-07-13 DIAGNOSIS — C50412 Malignant neoplasm of upper-outer quadrant of left female breast: Secondary | ICD-10-CM | POA: Insufficient documentation

## 2022-07-13 DIAGNOSIS — Z170 Estrogen receptor positive status [ER+]: Secondary | ICD-10-CM | POA: Diagnosis not present

## 2022-07-13 DIAGNOSIS — Z79811 Long term (current) use of aromatase inhibitors: Secondary | ICD-10-CM | POA: Diagnosis not present

## 2022-07-13 LAB — CBC WITH DIFFERENTIAL (CANCER CENTER ONLY)
Abs Immature Granulocytes: 0.14 10*3/uL — ABNORMAL HIGH (ref 0.00–0.07)
Basophils Absolute: 0 10*3/uL (ref 0.0–0.1)
Basophils Relative: 1 %
Eosinophils Absolute: 0.4 10*3/uL (ref 0.0–0.5)
Eosinophils Relative: 7 %
HCT: 22.9 % — ABNORMAL LOW (ref 36.0–46.0)
Hemoglobin: 8.1 g/dL — ABNORMAL LOW (ref 12.0–15.0)
Immature Granulocytes: 3 %
Lymphocytes Relative: 8 %
Lymphs Abs: 0.4 10*3/uL — ABNORMAL LOW (ref 0.7–4.0)
MCH: 32.5 pg (ref 26.0–34.0)
MCHC: 35.4 g/dL (ref 30.0–36.0)
MCV: 92 fL (ref 80.0–100.0)
Monocytes Absolute: 0.4 10*3/uL (ref 0.1–1.0)
Monocytes Relative: 8 %
Neutro Abs: 3.9 10*3/uL (ref 1.7–7.7)
Neutrophils Relative %: 73 %
Platelet Count: 207 10*3/uL (ref 150–400)
RBC: 2.49 MIL/uL — ABNORMAL LOW (ref 3.87–5.11)
RDW: 15.8 % — ABNORMAL HIGH (ref 11.5–15.5)
WBC Count: 5.3 10*3/uL (ref 4.0–10.5)
nRBC: 0 % (ref 0.0–0.2)

## 2022-07-13 LAB — CMP (CANCER CENTER ONLY)
ALT: 10 U/L (ref 0–44)
AST: 14 U/L — ABNORMAL LOW (ref 15–41)
Albumin: 4 g/dL (ref 3.5–5.0)
Alkaline Phosphatase: 70 U/L (ref 38–126)
Anion gap: 13 (ref 5–15)
BUN: 63 mg/dL — ABNORMAL HIGH (ref 8–23)
CO2: 16 mmol/L — ABNORMAL LOW (ref 22–32)
Calcium: 9.2 mg/dL (ref 8.9–10.3)
Chloride: 112 mmol/L — ABNORMAL HIGH (ref 98–111)
Creatinine: 5.25 mg/dL — ABNORMAL HIGH (ref 0.44–1.00)
GFR, Estimated: 8 mL/min — ABNORMAL LOW (ref >60)
Glucose, Bld: 130 mg/dL — ABNORMAL HIGH (ref 70–99)
Potassium: 4.1 mmol/L (ref 3.5–5.1)
Sodium: 141 mmol/L (ref 135–145)
Total Bilirubin: 0.4 mg/dL (ref 0.3–1.2)
Total Protein: 7 g/dL (ref 6.5–8.1)

## 2022-07-13 LAB — PREPARE RBC (CROSSMATCH)

## 2022-07-13 NOTE — Progress Notes (Signed)
[place]       Telephone: [phone]?Fax: [phone]   Oncology Clinical Pharmacist Practitioner Progress Note  Rachel Villarreal was contacted via in-person to discuss her chemotherapy regimen for abemaciclib which they receive under the care of Dr. Nicholas Lose.   Current treatment regimen and start date Abemaciclib (03/13/22) 50 mg BID started ~04/20/22 100 mg BID started 03/14/23 Letrozole (12/09/21)   Interval History She continues on abemaciclib 50 mg by mouth every 12 hours on days 1 to 28 of a 28-day cycle. This is being given in combination with letrozole Therapy is planned to continue until disease progression or unacceptable toxicity.  Rachel Villarreal was seen today as a follow-up to her abemaciclib management.  She last saw Dr. Lindi Adie on 06/16/22 and clinical pharmacy on 05/16/22. She did have a blood transfusion at her last visit with Dr. Lindi Adie and she feels it helped the fatigue. She continues to follow with her PCP who she sees next week (07/17/22) and her nephrologist who she sees on 08/09/22. Clinical pharmacy reviewed her scans from 07/10/22 with Dr. Lindi Adie today. Restaging scans will be reordered for 3 months per radiology recommendations.  Response to Therapy Rachel Villarreal continues to tolerate abemaciclib fairly well.  She is having occasional diarrhea and when that happens she takes 1 tablet of loperamide which helps.  She does not take 2 tablets because in the past that is made her constipated.  She states that shortness of breath that she has at times gets better when resting which has improved from before.  She does not feel that the shortness of breath has gotten any worse.  Her serum creatinine continues to be elevated, today estimated at 5.25 mg/dL.  She does admit that lately she has not been taking in as much flavored water (her preference) as she normally does.  She states that she played bridge recently and has been out and about more often as of late.  We did  encourage her to continue to drink plenty of fluids.  She says she is not noticing any differences in her urine output and is asymptomatic.  Her hemoglobin again today is decreased to an estimated 8.1 g/dL.  At her last visit with Dr. Lindi Adie it was estimated at 8.8 g/dL.  She did feel that she could use a blood transfusion and Dr. Geralyn Flash nurse was kind enough to set that up for either tomorrow or early next week.  Her blood pressure and pulse continue to be elevated when she is in clinic.  She is asymptomatic at this time.  She states at home that her systolic has a wide range of 130s-to [id number] and her diastolic is usually between 60s-70s.  Dr. Deforest Hoyles, her PCP, who manages this condition, and she will be seeing him next week.  She also continues to follow with nephrology and sees them every other month.  Next due on 08/09/2022.  She will continue to see Dr. Lindi Adie or clinical pharmacy monthly due to the possible need for blood transfusions and to monitor for toxicities.  She was in agreement with this plan.  Labs, vitals, treatment parameters, and manufacturer guidelines assessing toxicity were reviewed with Rachel Villarreal today. Based on these values, patient is in agreement to continue abemaciclib therapy at this time.  Allergies Allergies  Allergen Reactions   Ace Inhibitors     angioedema   Lactose Intolerance (Gi)    Lisinopril Swelling   Mercury     Red eyes,  through eye drops that contained thimerosal    Tape Dermatitis    plastic    Vitals    07/13/2022    2:30 PM 07/13/2022    2:02 PM 06/16/2022   11:50 AM  Oncology Vitals  Height  158 cm 158 cm  Weight  71.215 kg 71.623 kg  Weight (lbs)  157 lbs 157 lbs 14 oz  BMI  28.72 kg/m2   28.72 kg/m2 28.88 kg/m2   28.88 kg/m2  Temp  97.5 F (36.4 C) 97.7 F (36.5 C)  Pulse Rate 99 53 87  BP 165/54 169/59 147/63  Resp  18 19  SpO2  100 % 100 %  BSA (m2)  1.76 m2   1.76 m2 1.77 m2   1.77 m2    Laboratory Data    Latest Ref Rng &  Units 07/13/2022    1:20 PM 06/16/2022   11:36 AM 05/20/2022    7:49 PM  CBC EXTENDED  WBC 4.0 - 10.5 K/uL 5.3  6.2  7.0   RBC 3.87 - 5.11 MIL/uL 2.49  2.79  3.20   Hemoglobin 12.0 - 15.0 g/dL 8.1  8.8  9.6   HCT 36.0 - 46.0 % 22.9  25.4  28.9   Platelets 150 - 400 K/uL 207  207  221   NEUT# 1.7 - 7.7 K/uL 3.9  4.7    Lymph# 0.7 - 4.0 K/uL 0.4  1.0         Latest Ref Rng & Units 07/13/2022    1:20 PM 06/16/2022   11:36 AM 05/20/2022    7:49 PM  CMP  Glucose 70 - 99 mg/dL 130  111  95   BUN 8 - 23 mg/dL 63  64  58   Creatinine 0.44 - 1.00 mg/dL 5.25  4.66  4.69   Sodium 135 - 145 mmol/L 141  140  140   Potassium 3.5 - 5.1 mmol/L 4.1  4.5  4.7   Chloride 98 - 111 mmol/L 112  113  110   CO2 22 - 32 mmol/L 16  17  16    Calcium 8.9 - 10.3 mg/dL 9.2  8.7  9.3   Total Protein 6.5 - 8.1 g/dL 7.0  7.0  7.5   Total Bilirubin 0.3 - 1.2 mg/dL 0.4  0.3  0.4   Alkaline Phos 38 - 126 U/L 70  64  65   AST 15 - 41 U/L 14  14  12    ALT 0 - 44 U/L 10  10  9      No results found for: "MG" Lab Results  Component Value Date   CA2729 33.1 06/16/2022   CA2729 22.8 05/16/2022   CA2729 27.1 05/04/2022     Adverse Effects Assessment Serum creatinine elevation: Likely multifactorial but may be due to abemaciclib.  She does have chronic kidney disease and her baseline serum creatinine is estimated at 3.88 mg/dL.  She continues to closely follow with nephrology and next sees them on 08/09/2022 per her report Hemoglobin: Estimated at 8.1 g/dL today which is down from 8.8 at her last visit.  She is requesting 1 unit of PRBC which will be given either tomorrow or early next week.  Dr. Geralyn Flash nurses getting this scheduled. Diarrhea: Controlled with loperamide and stable.  She is using only 1 tablet of loperamide as needed as 2 tablets with her first loose stool makes her constipated  Adherence Assessment Rachel Villarreal reports missing 0 doses over the past 4 weeks.  Reason for missed dose: N/A Patient was  re-educated on importance of adherence.   Access Assessment Rachel Villarreal is currently receiving her abemaciclib through [facility] concerns: None  Medication Reconciliation The patient's medication list was reviewed today with the patient?  Yes New medications or herbal supplements have recently been started?  No Any medications have been discontinued?  No The medication list was updated and reconciled based on the patient's most recent medication list in the electronic medical record (EMR) including herbal products and OTC medications.   Medications Current Outpatient Medications  Medication Sig Dispense Refill   abemaciclib (VERZENIO) 50 MG tablet Take 1 tablet (50 mg total) by mouth 2 (two) times daily. Swallow tablets whole. Do not chew, crush, or split tablets before swallowing. 56 tablet 3   acetaminophen (TYLENOL) 500 MG tablet Take 500 mg by mouth every 6 (six) hours as needed for moderate pain.     amLODipine (NORVASC) 2.5 MG tablet Take 7.5 mg by mouth daily. evening     atorvastatin (LIPITOR) 10 MG tablet Take 10 mg by mouth daily.     budesonide (RHINOCORT AQUA) 32 MCG/ACT nasal spray Place 2 sprays into both nostrils daily.     Cholecalciferol (VITAMIN D3) 2000 units TABS Take 2,000 Units by mouth daily.     diclofenac Sodium (VOLTAREN) 1 % GEL Apply 1 Application topically 3 (three) times daily as needed (pain).     febuxostat (ULORIC) 40 MG tablet Take 40 mg by mouth daily.     hydrochlorothiazide (MICROZIDE) 12.5 MG capsule Take 12.5 mg by mouth daily as needed.     hydrocortisone cream 1 % Apply 1 Application topically 2 (two) times daily as needed for itching.     letrozole (FEMARA) 2.5 MG tablet Take 1 tablet (2.5 mg total) by mouth daily. 90 tablet 3   loratadine (CLARITIN) 10 MG tablet Take 10 mg by mouth 2 (two) times daily.     losartan (COZAAR) 100 MG tablet Take 100 mg by mouth daily.     melatonin 5 MG TABS Take 1 tablet (5 mg  total) by mouth at bedtime as needed.  0   Multiple Vitamins-Minerals (PRESERVISION AREDS 2+MULTI VIT PO) Take 1 capsule by mouth daily.     pantoprazole (PROTONIX) 40 MG tablet Take 40 mg by mouth daily.     PROAIR HFA 108 (90 BASE) MCG/ACT inhaler Inhale 2 puffs into the lungs every 6 (six) hours as needed (for respiratory issues.).      No current facility-administered medications for this visit.   Drug-Drug Interactions (DDIs) DDIs were evaluated?  Yes Significant DDIs?  We have discussed loratadine and HCTZ usage in the past with her The patient was instructed to speak with their health care provider and/or the oral chemotherapy pharmacist before starting any new drug, including prescription or over the counter, natural / herbal products, or vitamins.  Supportive Care Diarrhea: we reviewed that diarrhea is common with abemaciclib and confirmed that she does have loperamide (Imodium) at home.  We reviewed how to take this medication PRN. Neutropenia: we discussed the importance of having a thermometer and what the Centers for Disease Control and Prevention (CDC) considers a fever which is 100.68F (38C) or higher.  Gave patient 24/7 triage line to call if any fevers or symptoms. ILD/Pneumonitis: we reviewed potential symptoms including cough, shortness, and fatigue.  VTE: reviewed signs of DVT such as leg swelling, redness, pain, or tenderness and signs of PE such  as shortness of breath, rapid or irregular heartbeat, cough, chest pain, or lightheadedness. Reviewed to take the medication every 12 hours (with food sometimes can be easier on the stomach) and to take it at the same time every day.  Dosing Assessment Hepatic adjustments needed? No  Renal adjustments needed? No  Toxicity adjustments needed? No  The current dosing regimen is appropriate to continue at this time.  Follow-Up Plan Continue abemaciclib 50 mg by mouth every 12 hours Continue letrozole 2.5 mg by mouth  daily Continue loperamide as needed for diarrhea Monitor Scr, and Hgb. Will get 1 unit PRBC tomorrow Labs, Dr. Lindi Adie visit in one month. Dr. Lindi Adie may order restaging scans at that time. CT CAP last done 07/10/22 with short follow up recommended by radiology of 3 months.PET last done on 02/10/22. Will add labs, pharmacy clinic visit in 2 months Tumor marker labs not back yet but will likely result tomorrow. Last values were baseline.  Rachel Villarreal participated in the discussion, expressed understanding, and voiced agreement with the above plan. All questions were answered to her satisfaction. The patient was advised to contact the clinic at [phone] with any questions or concerns prior to her return visit.   I spent 30 minutes assessing and educating the patient.  Yamna Mackel A. Camille Bal, PharmD, BCOP, CPP  Raina Mina, RPH-CPP, 07/13/2022  2:38 PM   **Disclaimer: This note was dictated with voice recognition software. Similar sounding words can inadvertently be transcribed and this note may contain transcription errors which may not have been corrected upon publication of note.**

## 2022-07-13 NOTE — Progress Notes (Signed)
Pt hgb 8.1.  Verbal orders received for pt to receive 1 unit PRBC's.  Orders placed.

## 2022-07-14 ENCOUNTER — Inpatient Hospital Stay: Payer: Medicare Other

## 2022-07-14 DIAGNOSIS — Z170 Estrogen receptor positive status [ER+]: Secondary | ICD-10-CM | POA: Diagnosis not present

## 2022-07-14 DIAGNOSIS — Z79811 Long term (current) use of aromatase inhibitors: Secondary | ICD-10-CM | POA: Diagnosis not present

## 2022-07-14 DIAGNOSIS — C50412 Malignant neoplasm of upper-outer quadrant of left female breast: Secondary | ICD-10-CM | POA: Diagnosis not present

## 2022-07-14 LAB — CANCER ANTIGEN 27.29: CA 27.29: 31.2 U/mL (ref 0.0–38.6)

## 2022-07-14 LAB — CANCER ANTIGEN 15-3: CA 15-3: 30.4 U/mL — ABNORMAL HIGH (ref 0.0–25.0)

## 2022-07-14 MED ORDER — ACETAMINOPHEN 325 MG PO TABS
650.0000 mg | ORAL_TABLET | Freq: Once | ORAL | Status: AC
  Administered 2022-07-14: 650 mg via ORAL
  Filled 2022-07-14: qty 2

## 2022-07-14 MED ORDER — DIPHENHYDRAMINE HCL 25 MG PO CAPS
25.0000 mg | ORAL_CAPSULE | Freq: Once | ORAL | Status: AC
  Administered 2022-07-14: 25 mg via ORAL
  Filled 2022-07-14: qty 1

## 2022-07-14 MED ORDER — SODIUM CHLORIDE 0.9% IV SOLUTION
250.0000 mL | Freq: Once | INTRAVENOUS | Status: AC
  Administered 2022-07-14: 250 mL via INTRAVENOUS

## 2022-07-14 NOTE — Patient Instructions (Signed)

## 2022-07-15 LAB — BPAM RBC
Blood Product Expiration Date: 202404252359
ISSUE DATE / TIME: 202404051251
Unit Type and Rh: 6200

## 2022-07-15 LAB — TYPE AND SCREEN
ABO/RH(D): A POS
Antibody Screen: NEGATIVE
Unit division: 0

## 2022-07-17 ENCOUNTER — Telehealth: Payer: Self-pay | Admitting: Pharmacist

## 2022-07-17 NOTE — Telephone Encounter (Signed)
Rescheduled appointments per patient. She is aware of the changes made to her upcoming appointment.

## 2022-07-24 NOTE — Progress Notes (Unsigned)
Established Dialysis Access   History of Present Illness   Rachel Villarreal is a 77 y.o. (May 05, 1945) female who presents for follow up of  right BC AV fistula on 04/24/22 by Dr. Edilia Bo. She was seen 06/08/22 for post op evaluation with duplex. Duplex showed that her fistula was not fully matured yet so exercise regimen was recommended to help with maturation. She is not on HD. She returns today for re evaluation to see if her fistula has matured more now. She says she has been exercising her arm although she says she cannot use the squeeze ball for prolonged periods of time because it gives her tendinitis. She does report some numbness in her fingers but this has been present since her surgery. It is not painful and does not cause her to drop objects. She says she is still not on HD and has her next follow up with her Nephrologist in 2 weeks.   Current Outpatient Medications  Medication Sig Dispense Refill   abemaciclib (VERZENIO) 50 MG tablet Take 1 tablet (50 mg total) by mouth 2 (two) times daily. Swallow tablets whole. Do not chew, crush, or split tablets before swallowing. 56 tablet 3   acetaminophen (TYLENOL) 500 MG tablet Take 500 mg by mouth every 6 (six) hours as needed for moderate pain.     amLODipine (NORVASC) 2.5 MG tablet Take 7.5 mg by mouth daily. evening     atorvastatin (LIPITOR) 10 MG tablet Take 10 mg by mouth daily.     budesonide (RHINOCORT AQUA) 32 MCG/ACT nasal spray Place 2 sprays into both nostrils daily.     Cholecalciferol (VITAMIN D3) 2000 units TABS Take 2,000 Units by mouth daily.     diclofenac Sodium (VOLTAREN) 1 % GEL Apply 1 Application topically 3 (three) times daily as needed (pain).     febuxostat (ULORIC) 40 MG tablet Take 40 mg by mouth daily.     hydrochlorothiazide (MICROZIDE) 12.5 MG capsule Take 12.5 mg by mouth daily as needed.     hydrocortisone cream 1 % Apply 1 Application topically 2 (two) times daily as needed for itching.     letrozole  (FEMARA) 2.5 MG tablet Take 1 tablet (2.5 mg total) by mouth daily. 90 tablet 3   loratadine (CLARITIN) 10 MG tablet Take 10 mg by mouth 2 (two) times daily.     losartan (COZAAR) 100 MG tablet Take 100 mg by mouth daily.     melatonin 5 MG TABS Take 1 tablet (5 mg total) by mouth at bedtime as needed.  0   Multiple Vitamins-Minerals (PRESERVISION AREDS 2+MULTI VIT PO) Take 1 capsule by mouth daily.     pantoprazole (PROTONIX) 40 MG tablet Take 40 mg by mouth daily.     PROAIR HFA 108 (90 BASE) MCG/ACT inhaler Inhale 2 puffs into the lungs every 6 (six) hours as needed (for respiratory issues.).      No current facility-administered medications for this visit.    On ROS today: negative unless stated in HPI   Physical Examination   Vitals:   07/27/22 1405  BP: (!) 151/68  Pulse: 80  Resp: 20  Temp: 98.2 F (36.8 C)  SpO2: 96%  Weight: 155 lb (70.3 kg)  Height: 5\' 2"  (1.575 m)   Body mass index is 28.35 kg/m.  General Well appearing, well nourished in no distress  Pulmonary Non labored  Cardiac Regular rate and rhythm  Vascular 2+ right radial pulse, right hand warm, 5/5 grip strength.  Fistula with good thrill. Easily palpable at [facility] it does get harder to palpate in mid upper right arm   Musculo- skeletal M/S 5/5 throughout  , Extremities without ischemic changes    Neurologic A&O; CN grossly intact     Non-invasive Vascular Imaging   right Arm Access Duplex  (07/27/22):  Findings:  +--------------------+----------+-----------------+--------+  AVF                PSV (cm/s)Flow Vol (mL/min)Comments  +--------------------+----------+-----------------+--------+  Native artery inflow   300          1437                 +--------------------+----------+-----------------+--------+  AVF Anastomosis        648                               +--------------------+----------+-----------------+--------+      +------------+----------+-------------+----------+--------------+  OUTFLOW VEINPSV (cm/s)Diameter (cm)Depth (cm)   Describe     +------------+----------+-------------+----------+--------------+  Shoulder      530        0.59        0.77   Retained valve  +------------+----------+-------------+----------+--------------+  Prox UA        288        0.48        0.88                   +------------+----------+-------------+----------+--------------+  Mid UA         227        0.59        0.46                   +------------+----------+-------------+----------+--------------+  Dist UA        209        0.94        0.18                   +------------+----------+-------------+----------+--------------+  AC Fossa       482        0.41        0.49                   +------------+----------+-------------+----------+--------------+     Summary:  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    Medical Decision Making   Rachel Villarreal is a 77 y.o. female who presents for follow up of right BC AV fistula on 04/24/22 by Dr. Edilia Bo. The fistula has been very slow to mature. She was encouraged to exercise the fistula and returned with duplex today. Fistula is essentially unchanged. There still remains some are in mid arm that is not fully matured. It otherwise is patent with good volume flow. She is not on Hemodialysis at this time. Per patient she is unsure when she is anticipating starting dialysis. At this time would not recommend any fistulogram  to keep her kidney function protected. She may in the future need fistulogram with possible balloon assisted maturation once she is closer to needing HD. Recommend she follow up with [place] needed as she approaches starting HD   Graceann Congress, PA-C [facility] Office: [phone]  Clinic MD: Edilia Bo

## 2022-07-25 ENCOUNTER — Other Ambulatory Visit: Payer: Self-pay | Admitting: Hematology and Oncology

## 2022-07-25 ENCOUNTER — Other Ambulatory Visit (HOSPITAL_COMMUNITY): Payer: Self-pay

## 2022-07-25 ENCOUNTER — Other Ambulatory Visit: Payer: Self-pay

## 2022-07-25 DIAGNOSIS — D485 Neoplasm of uncertain behavior of skin: Secondary | ICD-10-CM | POA: Diagnosis not present

## 2022-07-25 DIAGNOSIS — L905 Scar conditions and fibrosis of skin: Secondary | ICD-10-CM | POA: Diagnosis not present

## 2022-07-25 MED ORDER — ABEMACICLIB 50 MG PO TABS
50.0000 mg | ORAL_TABLET | Freq: Two times a day (BID) | ORAL | 3 refills | Status: DC
  Filled 2022-07-25: qty 56, 28d supply, fill #0
  Filled 2022-08-22: qty 56, 28d supply, fill #1

## 2022-07-27 ENCOUNTER — Ambulatory Visit (HOSPITAL_COMMUNITY)
Admission: RE | Admit: 2022-07-27 | Discharge: 2022-07-27 | Disposition: A | Discharge status: Home / Self Care | Payer: Medicare Other | Source: Ambulatory Visit | Attending: Vascular Surgery | Admitting: Vascular Surgery

## 2022-07-27 ENCOUNTER — Ambulatory Visit: Payer: Medicare Other | Admitting: Physician Assistant

## 2022-07-27 ENCOUNTER — Other Ambulatory Visit: Payer: Self-pay

## 2022-07-27 VITALS — BP 151/68 | HR 80 | Temp 98.2°F | Resp 20 | Ht 62.0 in | Wt 155.0 lb

## 2022-07-27 DIAGNOSIS — I10 Essential (primary) hypertension: Secondary | ICD-10-CM | POA: Diagnosis not present

## 2022-07-27 DIAGNOSIS — N185 Chronic kidney disease, stage 5: Secondary | ICD-10-CM | POA: Diagnosis not present

## 2022-07-27 DIAGNOSIS — N184 Chronic kidney disease, stage 4 (severe): Secondary | ICD-10-CM | POA: Insufficient documentation

## 2022-07-27 DIAGNOSIS — E7800 Pure hypercholesterolemia, unspecified: Secondary | ICD-10-CM | POA: Diagnosis not present

## 2022-07-31 DIAGNOSIS — I700 Atherosclerosis of aorta: Secondary | ICD-10-CM | POA: Diagnosis not present

## 2022-07-31 DIAGNOSIS — C50912 Malignant neoplasm of unspecified site of left female breast: Secondary | ICD-10-CM | POA: Diagnosis not present

## 2022-07-31 DIAGNOSIS — N185 Chronic kidney disease, stage 5: Secondary | ICD-10-CM | POA: Diagnosis not present

## 2022-07-31 DIAGNOSIS — D8481 Immunodeficiency due to conditions classified elsewhere: Secondary | ICD-10-CM | POA: Diagnosis not present

## 2022-07-31 DIAGNOSIS — M109 Gout, unspecified: Secondary | ICD-10-CM | POA: Diagnosis not present

## 2022-07-31 DIAGNOSIS — I120 Hypertensive chronic kidney disease with stage 5 chronic kidney disease or end stage renal disease: Secondary | ICD-10-CM | POA: Diagnosis not present

## 2022-07-31 DIAGNOSIS — N2581 Secondary hyperparathyroidism of renal origin: Secondary | ICD-10-CM | POA: Diagnosis not present

## 2022-08-09 DIAGNOSIS — E7800 Pure hypercholesterolemia, unspecified: Secondary | ICD-10-CM | POA: Diagnosis not present

## 2022-08-09 DIAGNOSIS — E872 Acidosis, unspecified: Secondary | ICD-10-CM | POA: Diagnosis not present

## 2022-08-09 DIAGNOSIS — N185 Chronic kidney disease, stage 5: Secondary | ICD-10-CM | POA: Diagnosis not present

## 2022-08-09 DIAGNOSIS — D631 Anemia in chronic kidney disease: Secondary | ICD-10-CM | POA: Diagnosis not present

## 2022-08-09 DIAGNOSIS — I120 Hypertensive chronic kidney disease with stage 5 chronic kidney disease or end stage renal disease: Secondary | ICD-10-CM | POA: Diagnosis not present

## 2022-08-09 DIAGNOSIS — M109 Gout, unspecified: Secondary | ICD-10-CM | POA: Diagnosis not present

## 2022-08-09 NOTE — Progress Notes (Signed)
Patient Care Team: Georgann Housekeeper, MD as PCP - General (Internal Medicine) Jerene Bears, MD as Consulting Physician (Gynecology) Pershing Proud, RN as Oncology Nurse Navigator Donnelly Angelica, RN as Oncology Nurse Navigator Serena Croissant, MD as Consulting Physician (Hematology and Oncology) Tyler Pita, MD as Attending Physician (Nephrology)  DIAGNOSIS:  Encounter Diagnosis  Name Primary?   Malignant neoplasm of upper-outer quadrant of left breast in female, estrogen receptor positive (HCC) Yes    SUMMARY OF ONCOLOGIC HISTORY: Oncology History  Malignant neoplasm of upper-outer quadrant of left breast in female, estrogen receptor positive (HCC)  08/02/2021 Initial Diagnosis   Screening mammogram detected left breast distortion measuring 1.1 cm by ultrasound, negative axillary lymph nodes.  Biopsy revealed grade 2 ILC ER 100%, PR 95%, Ki-67 5%, HER2 IHC 2+, negative ratio 1.29   08/29/2021 Cancer Staging   Staging form: Breast, AJCC 8th Edition - Clinical stage from 08/29/2021: Stage IA (cT1c, cN0, cM0, G2, ER+, PR+, HER2-) - Signed by Serena Croissant, MD on 08/29/2021 Stage prefix: Initial diagnosis Histologic grading system: 3 grade system     CHIEF COMPLIANT: Follow-up on Verzenio and scans  INTERVAL HISTORY: ABCDE SPEZIA is a 77 y.o. female is here because of recent diagnosis of left breast cancer. She presents to the clinic today for a follow-up. She reports that she is still having diarrhea, but not everyday. She says it is 2-3 days a week and it is multiple times. She does have some post nasal drip that has been constant. She also has a cough and SOB that comes and goes.   ALLERGIES:  is allergic to ace inhibitors, lactose intolerance (gi), lisinopril, mercury, and tape.  MEDICATIONS:  Current Outpatient Medications  Medication Sig Dispense Refill   abemaciclib (VERZENIO) 50 MG tablet Take 1 tablet (50 mg total) by mouth 2 (two) times daily. Swallow tablets  whole. Do not chew, crush, or split tablets before swallowing. 56 tablet 3   acetaminophen (TYLENOL) 500 MG tablet Take 500 mg by mouth every 6 (six) hours as needed for moderate pain.     amLODipine (NORVASC) 2.5 MG tablet Take 7.5 mg by mouth daily. evening     atorvastatin (LIPITOR) 10 MG tablet Take 10 mg by mouth daily.     budesonide (RHINOCORT AQUA) 32 MCG/ACT nasal spray Place 2 sprays into both nostrils daily.     Cholecalciferol (VITAMIN D3) 2000 units TABS Take 2,000 Units by mouth daily.     Cholecalciferol 50 MCG (2000 UT) CAPS 1 tablet Orally once a day     diclofenac Sodium (VOLTAREN) 1 % GEL Apply 1 Application topically 3 (three) times daily as needed (pain).     diclofenac Sodium (VOLTAREN) 1 % GEL as directed Externally as needed     febuxostat (ULORIC) 40 MG tablet Take 40 mg by mouth daily.     hydrocortisone cream 1 % Apply 1 Application topically 2 (two) times daily as needed for itching.     letrozole (FEMARA) 2.5 MG tablet Take 1 tablet (2.5 mg total) by mouth daily. 90 tablet 3   Lidocaine 0.5 % AERO as directed Externally as needed     loratadine (CLARITIN) 10 MG tablet Take 10 mg by mouth 2 (two) times daily.     losartan (COZAAR) 100 MG tablet Take 100 mg by mouth daily.     Melatonin 1 MG CAPS Melatonin     melatonin 5 MG TABS Take 1 tablet (5 mg total) by mouth at  bedtime as needed.  0   metoprolol succinate (TOPROL-XL) 50 MG 24 hr tablet Take 50 mg by mouth daily. At bedtime     Multiple Vitamins-Minerals (PRESERVISION AREDS 2+MULTI VIT PO) Take 1 capsule by mouth daily.     pantoprazole (PROTONIX) 40 MG tablet Take 40 mg by mouth daily.     PROAIR HFA 108 (90 BASE) MCG/ACT inhaler Inhale 2 puffs into the lungs every 6 (six) hours as needed (for respiratory issues.).      No current facility-administered medications for this visit.    PHYSICAL EXAMINATION: ECOG PERFORMANCE STATUS: 1 - Symptomatic but completely ambulatory  Vitals:   08/10/22 1435  BP: (!)  152/59  Pulse: 79  Resp: 18  Temp: 97.9 F (36.6 C)  SpO2: 97%   Filed Weights   08/10/22 1435  Weight: 156 lb 9.6 oz (71 kg)     LABORATORY DATA:  I have reviewed the data as listed    Latest Ref Rng & Units 08/10/2022    2:10 PM 07/13/2022    1:20 PM 06/16/2022   11:36 AM  CMP  Glucose 70 - 99 mg/dL 161  096  045   BUN 8 - 23 mg/dL 68  63  64   Creatinine 0.44 - 1.00 mg/dL 4.09  8.11  9.14   Sodium 135 - 145 mmol/L 140  141  140   Potassium 3.5 - 5.1 mmol/L 4.9  4.1  4.5   Chloride 98 - 111 mmol/L 114  112  113   CO2 22 - 32 mmol/L 16  16  17    Calcium 8.9 - 10.3 mg/dL 8.7  9.2  8.7   Total Protein 6.5 - 8.1 g/dL 6.7  7.0  7.0   Total Bilirubin 0.3 - 1.2 mg/dL 0.4  0.4  0.3   Alkaline Phos 38 - 126 U/L 70  70  64   AST 15 - 41 U/L 15  14  14    ALT 0 - 44 U/L 12  10  10      Lab Results  Component Value Date   WBC 6.1 08/10/2022   HGB 8.9 (L) 08/10/2022   HCT 25.6 (L) 08/10/2022   MCV 93.4 08/10/2022   PLT 196 08/10/2022   NEUTROABS 4.3 08/10/2022    ASSESSMENT & PLAN:  Malignant neoplasm of upper-outer quadrant of left breast in female, estrogen receptor positive (HCC) Left Lumpectomy: 1.1 cm Grade 1 ILC , Positive Posterior margin, ER 100%, PR 95%, Ki-67 5%, HER2 IHC 2+, negative ratio 1.29 Margin excision 09/29/2021: Benign  Left lower quadrant abdominal pain: CT abdomen 02/07/2022: Right lower quadrant mesenteric mass, 5 mm right lung base nodule   PET CT scan 02/10/2022: 2.7 cm mesenteric mass is hypermetabolic consistent with neoplastic process.  Multiple hypermetabolic bone lesions involving the spine most notable at L4, focus of hypermetabolism or vaginal cuff, right atrial appendage -------------------------------------------------------------------- Treatment plan:  Verzinio (started 04/11/2022) to letrozole Verzinio Toxicities: Diarrhea: Improved with Imodium.  We had to reduce the dosage to 50 twice daily. Anemia: Will transfuse 1 unit of PRBC.  If it persist  then we will consider giving her Retacrit. We will add tumor markers to her blood work with each time.   07/10/2022: CT CAP: Interval decrease in the size of the soft tissue nodule in the right side of mid pelvic mesentery 2 cm (previously 2.7 cm), skin thickening left chest, patchy groundglass parenchymal opacities in the lungs right-sided renal atrophy  Radiology review: I discussed with  the patient that the CT findings showing response to treatment.  I am slightly concerned about the patchy groundglass opacities in the lungs which could be an adverse effect of Verzinio (but could also be related to recent COVID infection)  Recheck scan in 3 months and follow-up after that.    Orders Placed This Encounter  Procedures   CT CHEST ABDOMEN PELVIS WO CONTRAST    Standing Status:   Future    Standing Expiration Date:   08/10/2023    Order Specific Question:   Preferred imaging location?    Answer:   [facility]    Order Specific Question:   If indicated for the ordered procedure, I authorize the administration of oral contrast media per Radiology protocol    Answer:   Yes    Order Specific Question:   Does the patient have a contrast media/X-ray dye allergy?    Answer:   Yes    Order Specific Question:   Release to patient    Answer:   Immediate   The patient has a good understanding of the overall plan. she agrees with it. she will call with any problems that may develop before the next visit here. Total time spent: 30 mins including face to face time and time spent for planning, charting and co-ordination of care   Tamsen Meek, MD 08/10/22    I Janan Ridge am acting as a Neurosurgeon for [employer]  I have reviewed the above documentation for accuracy and completeness, and I agree with the above.

## 2022-08-10 ENCOUNTER — Inpatient Hospital Stay: Payer: Medicare Other | Admitting: Hematology and Oncology

## 2022-08-10 ENCOUNTER — Inpatient Hospital Stay: Payer: Medicare Other

## 2022-08-10 ENCOUNTER — Inpatient Hospital Stay: Payer: Medicare Other | Attending: Hematology and Oncology

## 2022-08-10 ENCOUNTER — Inpatient Hospital Stay (HOSPITAL_BASED_OUTPATIENT_CLINIC_OR_DEPARTMENT_OTHER): Payer: Medicare Other | Admitting: Hematology and Oncology

## 2022-08-10 VITALS — BP 152/59 | HR 79 | Temp 97.9°F | Resp 18 | Ht 62.0 in | Wt 156.6 lb

## 2022-08-10 DIAGNOSIS — Z170 Estrogen receptor positive status [ER+]: Secondary | ICD-10-CM | POA: Insufficient documentation

## 2022-08-10 DIAGNOSIS — Z79811 Long term (current) use of aromatase inhibitors: Secondary | ICD-10-CM | POA: Diagnosis not present

## 2022-08-10 DIAGNOSIS — C50412 Malignant neoplasm of upper-outer quadrant of left female breast: Secondary | ICD-10-CM

## 2022-08-10 LAB — CMP (CANCER CENTER ONLY)
ALT: 12 U/L (ref 0–44)
AST: 15 U/L (ref 15–41)
Albumin: 3.9 g/dL (ref 3.5–5.0)
Alkaline Phosphatase: 70 U/L (ref 38–126)
Anion gap: 10 (ref 5–15)
BUN: 68 mg/dL — ABNORMAL HIGH (ref 8–23)
CO2: 16 mmol/L — ABNORMAL LOW (ref 22–32)
Calcium: 8.7 mg/dL — ABNORMAL LOW (ref 8.9–10.3)
Chloride: 114 mmol/L — ABNORMAL HIGH (ref 98–111)
Creatinine: 5.3 mg/dL — ABNORMAL HIGH (ref 0.44–1.00)
GFR, Estimated: 8 mL/min — ABNORMAL LOW (ref >60)
Glucose, Bld: 124 mg/dL — ABNORMAL HIGH (ref 70–99)
Potassium: 4.9 mmol/L (ref 3.5–5.1)
Sodium: 140 mmol/L (ref 135–145)
Total Bilirubin: 0.4 mg/dL (ref 0.3–1.2)
Total Protein: 6.7 g/dL (ref 6.5–8.1)

## 2022-08-10 LAB — CBC WITH DIFFERENTIAL (CANCER CENTER ONLY)
Abs Immature Granulocytes: 0.12 10*3/uL — ABNORMAL HIGH (ref 0.00–0.07)
Basophils Absolute: 0 10*3/uL (ref 0.0–0.1)
Basophils Relative: 0 %
Eosinophils Absolute: 0.3 10*3/uL (ref 0.0–0.5)
Eosinophils Relative: 5 %
HCT: 25.6 % — ABNORMAL LOW (ref 36.0–46.0)
Hemoglobin: 8.9 g/dL — ABNORMAL LOW (ref 12.0–15.0)
Immature Granulocytes: 2 %
Lymphocytes Relative: 14 %
Lymphs Abs: 0.9 10*3/uL (ref 0.7–4.0)
MCH: 32.5 pg (ref 26.0–34.0)
MCHC: 34.8 g/dL (ref 30.0–36.0)
MCV: 93.4 fL (ref 80.0–100.0)
Monocytes Absolute: 0.4 10*3/uL (ref 0.1–1.0)
Monocytes Relative: 7 %
Neutro Abs: 4.3 10*3/uL (ref 1.7–7.7)
Neutrophils Relative %: 72 %
Platelet Count: 196 10*3/uL (ref 150–400)
RBC: 2.74 MIL/uL — ABNORMAL LOW (ref 3.87–5.11)
RDW: 15.9 % — ABNORMAL HIGH (ref 11.5–15.5)
WBC Count: 6.1 10*3/uL (ref 4.0–10.5)
nRBC: 0 % (ref 0.0–0.2)

## 2022-08-10 NOTE — Assessment & Plan Note (Addendum)
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  Multiple hypermetabolic bone lesions involving the spine most notable at L4, focus of hypermetabolism or vaginal cuff, right atrial appendage -------------------------------------------------------------------- Treatment plan:  Verzinio (started 04/11/2022) to letrozole Verzinio Toxicities: Diarrhea: Improved with Imodium.  We had to reduce the dosage to 50 twice daily. Anemia: Will transfuse 1 unit of PRBC.  If it persist then we will consider giving her Retacrit. We will add tumor markers to her blood work with each time.   07/10/2022: CT CAP: Interval decrease in the size of the soft tissue nodule in the right side of mid pelvic mesentery 2 cm (previously 2.7 cm), skin thickening left chest, patchy groundglass parenchymal opacities in the lungs right-sided renal atrophy  Radiology review: I discussed with the patient that the CT findings showing response to treatment.  I am slightly concerned about the patchy groundglass opacities in the lungs which could be an adverse effect of Verzinio (but could also be related to recent COVID infection)  Recheck scan in 3 months and follow-up after that.

## 2022-08-11 ENCOUNTER — Telehealth: Payer: Self-pay | Admitting: Hematology and Oncology

## 2022-08-11 LAB — CANCER ANTIGEN 27.29: CA 27.29: 33.2 U/mL (ref 0.0–38.6)

## 2022-08-11 NOTE — Telephone Encounter (Signed)
Scheduled appointment per 5/2 los. Patient is aware of the made appointment. 

## 2022-08-12 LAB — CANCER ANTIGEN 15-3: CA 15-3: 32.6 U/mL — ABNORMAL HIGH (ref 0.0–25.0)

## 2022-08-22 ENCOUNTER — Other Ambulatory Visit (HOSPITAL_COMMUNITY): Payer: Self-pay

## 2022-08-23 ENCOUNTER — Ambulatory Visit (HOSPITAL_COMMUNITY)
Admission: RE | Admit: 2022-08-23 | Discharge: 2022-08-23 | Disposition: A | Discharge status: Home / Self Care | Payer: Medicare Other | Source: Ambulatory Visit | Attending: Physician Assistant | Admitting: Physician Assistant

## 2022-08-23 ENCOUNTER — Other Ambulatory Visit (HOSPITAL_COMMUNITY): Payer: Self-pay

## 2022-08-23 ENCOUNTER — Other Ambulatory Visit: Payer: Self-pay

## 2022-08-23 ENCOUNTER — Inpatient Hospital Stay: Payer: Medicare Other

## 2022-08-23 ENCOUNTER — Inpatient Hospital Stay (HOSPITAL_BASED_OUTPATIENT_CLINIC_OR_DEPARTMENT_OTHER): Payer: Medicare Other | Admitting: Physician Assistant

## 2022-08-23 VITALS — BP 133/54 | HR 77 | Temp 97.9°F | Resp 18 | Wt 155.4 lb

## 2022-08-23 DIAGNOSIS — J984 Other disorders of lung: Secondary | ICD-10-CM | POA: Diagnosis not present

## 2022-08-23 DIAGNOSIS — T50905A Adverse effect of unspecified drugs, medicaments and biological substances, initial encounter: Secondary | ICD-10-CM | POA: Diagnosis not present

## 2022-08-23 DIAGNOSIS — R0602 Shortness of breath: Secondary | ICD-10-CM

## 2022-08-23 DIAGNOSIS — J90 Pleural effusion, not elsewhere classified: Secondary | ICD-10-CM | POA: Diagnosis not present

## 2022-08-23 DIAGNOSIS — R0609 Other forms of dyspnea: Secondary | ICD-10-CM | POA: Diagnosis not present

## 2022-08-23 DIAGNOSIS — Z170 Estrogen receptor positive status [ER+]: Secondary | ICD-10-CM | POA: Diagnosis not present

## 2022-08-23 DIAGNOSIS — C50412 Malignant neoplasm of upper-outer quadrant of left female breast: Secondary | ICD-10-CM | POA: Insufficient documentation

## 2022-08-23 DIAGNOSIS — Z79811 Long term (current) use of aromatase inhibitors: Secondary | ICD-10-CM | POA: Diagnosis not present

## 2022-08-23 LAB — CBC WITH DIFFERENTIAL (CANCER CENTER ONLY)
Abs Immature Granulocytes: 0.14 10*3/uL — ABNORMAL HIGH (ref 0.00–0.07)
Basophils Absolute: 0 10*3/uL (ref 0.0–0.1)
Basophils Relative: 0 %
Eosinophils Absolute: 0.2 10*3/uL (ref 0.0–0.5)
Eosinophils Relative: 4 %
HCT: 24.3 % — ABNORMAL LOW (ref 36.0–46.0)
Hemoglobin: 8.5 g/dL — ABNORMAL LOW (ref 12.0–15.0)
Immature Granulocytes: 2 %
Lymphocytes Relative: 10 %
Lymphs Abs: 0.7 10*3/uL (ref 0.7–4.0)
MCH: 32.9 pg (ref 26.0–34.0)
MCHC: 35 g/dL (ref 30.0–36.0)
MCV: 94.2 fL (ref 80.0–100.0)
Monocytes Absolute: 0.6 10*3/uL (ref 0.1–1.0)
Monocytes Relative: 9 %
Neutro Abs: 5.2 10*3/uL (ref 1.7–7.7)
Neutrophils Relative %: 75 %
Platelet Count: 212 10*3/uL (ref 150–400)
RBC: 2.58 MIL/uL — ABNORMAL LOW (ref 3.87–5.11)
RDW: 17.2 % — ABNORMAL HIGH (ref 11.5–15.5)
WBC Count: 6.9 10*3/uL (ref 4.0–10.5)
nRBC: 0 % (ref 0.0–0.2)

## 2022-08-23 LAB — CMP (CANCER CENTER ONLY)
ALT: 11 U/L (ref 0–44)
AST: 14 U/L — ABNORMAL LOW (ref 15–41)
Albumin: 4 g/dL (ref 3.5–5.0)
Alkaline Phosphatase: 69 U/L (ref 38–126)
Anion gap: 12 (ref 5–15)
BUN: 66 mg/dL — ABNORMAL HIGH (ref 8–23)
CO2: 15 mmol/L — ABNORMAL LOW (ref 22–32)
Calcium: 8.8 mg/dL — ABNORMAL LOW (ref 8.9–10.3)
Chloride: 116 mmol/L — ABNORMAL HIGH (ref 98–111)
Creatinine: 5.25 mg/dL — ABNORMAL HIGH (ref 0.44–1.00)
GFR, Estimated: 8 mL/min — ABNORMAL LOW (ref >60)
Glucose, Bld: 96 mg/dL (ref 70–99)
Potassium: 4.8 mmol/L (ref 3.5–5.1)
Sodium: 143 mmol/L (ref 135–145)
Total Bilirubin: 0.4 mg/dL (ref 0.3–1.2)
Total Protein: 6.9 g/dL (ref 6.5–8.1)

## 2022-08-23 LAB — SAMPLE TO BLOOD BANK

## 2022-08-23 MED ORDER — METHYLPREDNISOLONE 4 MG PO TBPK: ORAL_TABLET | ORAL | 0 refills | Status: DC

## 2022-08-23 NOTE — Progress Notes (Signed)
Pt called with increased dyspnea on exertion that is now interfering with daily activities. Pt is being treated with Verzenio. Per MD last note there is concern for opacities found on latest CT r/t Verzenio or recent covid infection. Spoke with [facility] and decided on CXR, labs, and [facility] appt. Orders placed and appts made. Pt verbalized understanding.

## 2022-08-23 NOTE — Progress Notes (Signed)
Symptom Management Consult Note [facility]    Patient Care Team: Georgann Housekeeper, MD as PCP - General (Internal Medicine) Jerene Bears, MD as Consulting Physician (Gynecology) Pershing Proud, RN as Oncology Nurse Navigator Donnelly Angelica, RN as Oncology Nurse Navigator Serena Croissant, MD as Consulting Physician (Hematology and Oncology) Tyler Pita, MD as Attending Physician (Nephrology)    Name / MRN / DOB: Rachel Villarreal  [id number]  December 06, 1945   Date of visit: 08/23/2022   Chief Complaint/Reason for visit: Shortness of breath   Current Therapy: Verzenio     ASSESSMENT & PLAN: Patient is a 77 y.o. female  with oncologic history of Malignant neoplasm of upper-outer quadrant of left breast, estrogen receptor positive followed by Dr. Pamelia Hoit.  I have viewed most recent oncology note and lab work.    #Malignant neoplasm of upper-outer quadrant of left breast, estrogen receptor positive  - Next scheduled appointment with pharmacist is 09/07/22. -Based on findings today will have patient follow up with oncologist instead on/around 05/30. Request sent to scheduler to change appointment.   #Shortness of breath -Patient with acute on chronic shortness of breath.  HPI with worsening in the last week.  -Exam patient without respiratory distress.  No hypoxia.  Decreased lung sounds in right lung base.  Patient ambulated in clinic without hypoxia. -CBC without significant anemia, similar to recent labs.  No indication for emergent transfusion. -Chest x-ray was performed and shows no acute infectious process.  I viewed imaging and agree with radiologist impression. -Based on patient's symptoms concern for pneumonitis from immunotherapy.  CT scan high-resolution obtained and does show worsening groundglass opacities in bilateral lungs, right worse than left.  I discussed CT results with oncologist who agrees with plan to start on Medrol Dosepak and hold Verzenio.  No infectious symptoms. Will hold off on antibiotics at this time. -Discussed test results and plan with patient.  She is agreeable.  Strict ED precautions discussed should symptoms worsen.      Heme/Onc History: Oncology History  Malignant neoplasm of upper-outer quadrant of left breast in female, estrogen receptor positive (HCC)  08/02/2021 Initial Diagnosis   Screening mammogram detected left breast distortion measuring 1.1 cm by ultrasound, negative axillary lymph nodes.  Biopsy revealed grade 2 ILC ER 100%, PR 95%, Ki-67 5%, HER2 IHC 2+, negative ratio 1.29   08/29/2021 Cancer Staging   Staging form: Breast, AJCC 8th Edition - Clinical stage from 08/29/2021: Stage IA (cT1c, cN0, cM0, G2, ER+, PR+, HER2-) - Signed by Serena Croissant, MD on 08/29/2021 Stage prefix: Initial diagnosis Histologic grading system: 3 grade system       Interval history-: Rachel Villarreal is a 77 y.o. female with oncologic history as above presenting to [facility] today with chief complaint of shortness of breath. She presents unaccompanied.  Patient reports that she has had shortness of breath for approximately 6 months.  She noticed it started after she began Verzenio.  She states in the last week shortness of breath has worsened.  She has been experiencing dyspnea on exertion and has difficulty completing her ADLs.  She has to stop to rest to catch her breath for approximately 2 minutes before being able to get up and complete tasks.  She denies any associated chest pain.  She denies any fever or nasal congestion.  She does take Claritin daily.  She admits she had COVID 6 weeks ago although felt she has recovered from that.  She has  tried using her albuterol inhaler without improvement.  She has not noticed any wheezing.  Patient states she received a unit of blood on 4/5 when her hemoglobin was 8.1.  She did not notice any improvement in her shortness of breath after the transfusion.  She denies any leg swelling.   Denies any cough or hemoptysis.   ROS  All other systems are reviewed and are negative for acute change except as noted in the HPI.    Allergies  Allergen Reactions   Ace Inhibitors     angioedema   Lactose Intolerance (Gi)    Lisinopril Swelling   Mercury     Red eyes, through eye drops that contained thimerosal    Tape Dermatitis    plastic     Past Medical History:  Diagnosis Date   Abnormal Pap smear of vagina    Anemia    Asthma    Mild   Cancer (HCC)    left breast ILC   Chronic kidney disease (CKD)    stage 4   GERD (gastroesophageal reflux disease)    Gout 05/2012   Hernia, incisional    History of abnormal Pap smear    CIN III, 2001   History of blood transfusion    POST OP   History of uterine fibroid    Hypertension    Peritonitis (HCC)    Respiratory failure (HCC)    after ruptured appendix   Ruptured appendix    Seasonal allergies    Shingles 02/2013   very mild case   Urine incontinence      Past Surgical History:  Procedure Laterality Date   APPENDECTOMY     AV FISTULA PLACEMENT Right 04/24/2022   Procedure: RIGHT BRACHIOCEPHALIC ARTERIOVENOUS (AV) FISTULA CREATION;  Surgeon: Chuck Hint, MD;  Location: [facility] OR;  Service: Vascular;  Laterality: Right;   BREAST BIOPSY Right    fibrocystic changes   BREAST BIOPSY Left 08/02/2021   BREAST LUMPECTOMY WITH RADIOACTIVE SEED LOCALIZATION Left 09/12/2021   Procedure: LEFT BREAST LUMPECTOMY WITH RADIOACTIVE SEED LOCALIZATION;  Surgeon: Emelia Loron, MD;  Location: [facility];  Service: General;  Laterality: Left;   CATARACT EXTRACTION Bilateral    COLONOSCOPY     COLONOSCOPY WITH PROPOFOL N/A 02/18/2018   Procedure: COLONOSCOPY WITH PROPOFOL;  Surgeon: Vida Rigger, MD;  Location: [facility] ENDOSCOPY;  Service: Endoscopy;  Laterality: N/A;  Use ultraslim scope    LEEP  2000   CIN 2/3   RE-EXCISION OF BREAST LUMPECTOMY Left 09/29/2021   Procedure: LEFT  BREAST RE-EXCISION  LUMPECTOMY;  Surgeon: Emelia Loron, MD;  Location: [facility] ORS;  Service: General;  Laterality: Left;   REFRACTIVE SURGERY     SUPRACERVICAL ABDOMINAL HYSTERECTOMY  2001    Social History   Socioeconomic History   Marital status: Divorced    Spouse name: Not on file   Number of children: Not on file   Years of education: Not on file   Highest education level: Not on file  Occupational History   Not on file  Tobacco Use   Smoking status: Former    Types: Cigarettes    Quit date: 04/10/1972    Years since quitting: 50.4   Smokeless tobacco: Never  Vaping Use   Vaping Use: Never used  Substance and Sexual Activity   Alcohol use: No   Drug use: No   Sexual activity: Not Currently    Partners: Male    Birth control/protection: Surgical    Comment: hysterectomy  Other Topics Concern   Not on file  Social History Narrative   Not on file   Social Determinants of Health   Financial Resource Strain: Low Risk  (09/02/2021)   Overall Financial Resource Strain (CARDIA)    Difficulty of Paying Living Expenses: Not hard at all  Food Insecurity: No Food Insecurity (09/02/2021)   Hunger Vital Sign    Worried About Running Out of Food in the Last Year: Never true    Ran Out of Food in the Last Year: Never true  Transportation Needs: No Transportation Needs (09/02/2021)   PRAPARE - Administrator, Civil Service (Medical): No    Lack of Transportation (Non-Medical): No  Physical Activity: Not on file  Stress: No Stress Concern Present (09/02/2021)   [employer] of Occupational Health - Occupational Stress Questionnaire    Feeling of Stress : Not at all  Social Connections: Not on file  Intimate Partner Violence: Not on file    Family History  Problem Relation Age of Onset   Colon cancer Father    Breast cancer Neg Hx      Current Outpatient Medications:    methylPREDNISolone (MEDROL DOSEPAK) 4 MG TBPK tablet, Take 6 pills by mouth day 1, 5 on day 2, 4 on day 3,  3 on day 4, 2 on day 5, 1 on day 6, Disp: 21 tablet, Rfl: 0   abemaciclib (VERZENIO) 50 MG tablet, Take 1 tablet (50 mg total) by mouth 2 (two) times daily. Swallow tablets whole. Do not chew, crush, or split tablets before swallowing., Disp: 56 tablet, Rfl: 3   acetaminophen (TYLENOL) 500 MG tablet, Take 500 mg by mouth every 6 (six) hours as needed for moderate pain., Disp: , Rfl:    amLODipine (NORVASC) 2.5 MG tablet, Take 7.5 mg by mouth daily. evening, Disp: , Rfl:    atorvastatin (LIPITOR) 10 MG tablet, Take 10 mg by mouth daily., Disp: , Rfl:    budesonide (RHINOCORT AQUA) 32 MCG/ACT nasal spray, Place 2 sprays into both nostrils daily., Disp: , Rfl:    Cholecalciferol (VITAMIN D3) 2000 units TABS, Take 2,000 Units by mouth daily., Disp: , Rfl:    Cholecalciferol 50 MCG (2000 UT) CAPS, 1 tablet Orally once a day, Disp: , Rfl:    diclofenac Sodium (VOLTAREN) 1 % GEL, Apply 1 Application topically 3 (three) times daily as needed (pain)., Disp: , Rfl:    diclofenac Sodium (VOLTAREN) 1 % GEL, as directed Externally as needed, Disp: , Rfl:    febuxostat (ULORIC) 40 MG tablet, Take 40 mg by mouth daily., Disp: , Rfl:    hydrocortisone cream 1 %, Apply 1 Application topically 2 (two) times daily as needed for itching., Disp: , Rfl:    letrozole (FEMARA) 2.5 MG tablet, Take 1 tablet (2.5 mg total) by mouth daily., Disp: 90 tablet, Rfl: 3   Lidocaine 0.5 % AERO, as directed Externally as needed, Disp: , Rfl:    loratadine (CLARITIN) 10 MG tablet, Take 10 mg by mouth 2 (two) times daily., Disp: , Rfl:    losartan (COZAAR) 100 MG tablet, Take 100 mg by mouth daily., Disp: , Rfl:    Melatonin 1 MG CAPS, Melatonin, Disp: , Rfl:    melatonin 5 MG TABS, Take 1 tablet (5 mg total) by mouth at bedtime as needed., Disp: , Rfl: 0   metoprolol succinate (TOPROL-XL) 50 MG 24 hr tablet, Take 50 mg by mouth daily. At bedtime, Disp: , Rfl:  Multiple Vitamins-Minerals (PRESERVISION AREDS 2+MULTI VIT PO), Take 1  capsule by mouth daily., Disp: , Rfl:    pantoprazole (PROTONIX) 40 MG tablet, Take 40 mg by mouth daily., Disp: , Rfl:    PROAIR HFA 108 (90 BASE) MCG/ACT inhaler, Inhale 2 puffs into the lungs every 6 (six) hours as needed (for respiratory issues.). , Disp: , Rfl:   PHYSICAL EXAM: ECOG FS:2 - Symptomatic, <50% confined to bed    Vitals:   08/23/22 1213  BP: (!) 133/54  Pulse: 77  Resp: 18  Temp: 97.9 F (36.6 C)  TempSrc: Oral  SpO2: 98%  Weight: 155 lb 6.4 oz (70.5 kg)   Physical Exam Vitals and nursing note reviewed.  Constitutional:      Appearance: She is not ill-appearing or toxic-appearing.  HENT:     Head: Normocephalic.  Eyes:     Conjunctiva/sclera: Conjunctivae normal.  Cardiovascular:     Rate and Rhythm: Normal rate and regular rhythm.     Pulses: Normal pulses.     Heart sounds: Normal heart sounds.  Pulmonary:     Effort: Pulmonary effort is normal.     Breath sounds: Normal breath sounds. No wheezing, rhonchi or rales.     Comments: Creased air movement at right lung posterior base Abdominal:     General: There is no distension.  Musculoskeletal:     Cervical back: Normal range of motion.     Right lower leg: No edema.     Left lower leg: No edema.  Skin:    General: Skin is warm and dry.  Neurological:     Mental Status: She is alert.        LABORATORY DATA: I have reviewed the data as listed    Latest Ref Rng & Units 08/23/2022   11:45 AM 08/10/2022    2:10 PM 07/13/2022    1:20 PM  CBC  WBC 4.0 - 10.5 K/uL 6.9  6.1  5.3   Hemoglobin 12.0 - 15.0 g/dL 8.5  8.9  8.1   Hematocrit 36.0 - 46.0 % 24.3  25.6  22.9   Platelets 150 - 400 K/uL 212  196  207         Latest Ref Rng & Units 08/23/2022   11:45 AM 08/10/2022    2:10 PM 07/13/2022    1:20 PM  CMP  Glucose 70 - 99 mg/dL 96  962  952   BUN 8 - 23 mg/dL 66  68  63   Creatinine 0.44 - 1.00 mg/dL 8.41  3.24  4.01   Sodium 135 - 145 mmol/L 143  140  141   Potassium 3.5 - 5.1 mmol/L 4.8   4.9  4.1   Chloride 98 - 111 mmol/L 116  114  112   CO2 22 - 32 mmol/L 15  16  16    Calcium 8.9 - 10.3 mg/dL 8.8  8.7  9.2   Total Protein 6.5 - 8.1 g/dL 6.9  6.7  7.0   Total Bilirubin 0.3 - 1.2 mg/dL 0.4  0.4  0.4   Alkaline Phos 38 - 126 U/L 69  70  70   AST 15 - 41 U/L 14  15  14    ALT 0 - 44 U/L 11  12  10         RADIOGRAPHIC STUDIES (from last 24 hours if applicable) I have personally reviewed the radiological images as listed and agreed with the findings in the report. CT Chest High Resolution  Result Date: 08/23/2022 CLINICAL DATA:  Pneumonitis, immunotherapy related toxicity, shortness of breath, left breast cancer possibly metastatic * Tracking Code: BO * EXAM: CT CHEST WITHOUT CONTRAST TECHNIQUE: Multidetector CT imaging of the chest was performed following the standard protocol without intravenous contrast. High resolution imaging of the lungs, as well as inspiratory and expiratory imaging, was performed. RADIATION DOSE REDUCTION: This exam was performed according to the departmental dose-optimization program which includes automated exposure control, adjustment of the mA and/or kV according to patient size and/or use of iterative reconstruction technique. COMPARISON:  CT chest abdomen pelvis, 07/10/2022 FINDINGS: Cardiovascular: Aortic atherosclerosis. Normal heart size. Left and right coronary artery calcifications. No pericardial effusion. Mediastinum/Nodes: No enlarged mediastinal, hilar, or axillary lymph nodes. Thyroid gland, trachea, and esophagus demonstrate no significant findings. Lungs/Pleura: New small bilateral pleural effusions. Worsened irregular ground-glass and consolidative airspace opacities, particularly in the right lung (series 4, image 104). Subpleural radiation fibrosis of the left upper lobe (series 4, image 132). No significant air trapping on expiratory phase imaging. Upper Abdomen: No acute abnormality.  Coarse contour of the liver. Musculoskeletal: Skin  thickening of the left breast, partially imaged, with surgical clips centrally (series 2, image 77). No acute osseous findings. IMPRESSION: 1. 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 2. New small bilateral pleural effusions. 3. Subpleural radiation fibrosis of the left upper lobe. 4. Skin thickening of the left breast, partially imaged, with surgical clips centrally in keeping with known breast malignancy. 5. Coarse contour of the liver, suggestive of cirrhosis. Correlate with biochemical findings. 6. Coronary artery disease. Aortic Atherosclerosis (ICD10-I70.0). Electronically Signed   By: Jearld Lesch M.D.   On: 08/23/2022 14:30   DG Chest 2 View  Result Date: 08/23/2022 CLINICAL DATA:  77 year old female with shortness of breath. History of breast cancer. EXAM: CHEST - 2 VIEW COMPARISON:  Restaging CT Chest, Abdomen, and Pelvis today are reported separately. 07/10/2022. FINDINGS: PA and lateral views at 1132 hours. Patchy somewhat asymmetric anterior left lung and lower lung opacity may be sequelae of radiation. Lower lung volumes compared to 2012. Mediastinal contours remain within normal limits. No pneumothorax, pulmonary edema, pleural effusion, or consolidation. Visualized tracheal air column is within normal limits. No acute osseous abnormality identified. Negative visible bowel gas. IMPRESSION: Lower lung volume since 2012 with left greater than right lung parenchymal changes, similar to restaging CT last month and may be in part sequelae of radiation therapy. No acute cardiopulmonary abnormality identified. Electronically Signed   By: Odessa Fleming M.D.   On: 08/23/2022 11:56        Visit Diagnosis: 1. Malignant neoplasm of upper-outer quadrant of left breast in female, estrogen receptor positive (HCC)   2.  Shortness of breath   3. Drug-induced pneumonitis      Orders Placed This Encounter  Procedures   CT Chest High Resolution    Standing Status:   Future    Number of Occurrences:   1    Standing Expiration Date:   08/23/2023    Order Specific Question:   Preferred imaging location?    Answer:   [facility]    All questions were answered. The patient knows to call the clinic with any problems, questions or concerns. No barriers to learning was detected.  A total of more than 30 minutes were spent on this encounter with face-to-face time and non-face-to-face time, including  preparing to see the patient, ordering tests and/or medications, counseling the patient and coordination of care as outlined above.    Thank you for allowing me to participate in the care of this patient.    Shanon Ace, PA-C Department of Hematology/Oncology [facility] at [facility] Phone: [phone]  [phone]    08/23/2022 3:55 PM

## 2022-08-28 ENCOUNTER — Telehealth: Payer: Self-pay | Admitting: Hematology and Oncology

## 2022-08-28 NOTE — Telephone Encounter (Signed)
Rescheduled appointment per secure chat per [facility]. Patient is aware.

## 2022-09-01 NOTE — Progress Notes (Signed)
Patient Care Team: Georgann Housekeeper, MD as PCP - General (Internal Medicine) Jerene Bears, MD as Consulting Physician (Gynecology) Pershing Proud, RN as Oncology Nurse Navigator Donnelly Angelica, RN as Oncology Nurse Navigator Serena Croissant, MD as Consulting Physician (Hematology and Oncology) Tyler Pita, MD as Attending Physician (Nephrology)  DIAGNOSIS: No diagnosis found.  SUMMARY OF ONCOLOGIC HISTORY: Oncology History  Malignant neoplasm of upper-outer quadrant of left breast in female, estrogen receptor positive (HCC)  08/02/2021 Initial Diagnosis   Screening mammogram detected left breast distortion measuring 1.1 cm by ultrasound, negative axillary lymph nodes.  Biopsy revealed grade 2 ILC ER 100%, PR 95%, Ki-67 5%, HER2 IHC 2+, negative ratio 1.29   08/29/2021 Cancer Staging   Staging form: Breast, AJCC 8th Edition - Clinical stage from 08/29/2021: Stage IA (cT1c, cN0, cM0, G2, ER+, PR+, HER2-) - Signed by Serena Croissant, MD on 08/29/2021 Stage prefix: Initial diagnosis Histologic grading system: 3 grade system     CHIEF COMPLIANT:   INTERVAL HISTORY: JENNILEE CULLOP is a   ALLERGIES:  is allergic to ace inhibitors, lactose intolerance (gi), lisinopril, mercury, and tape.  MEDICATIONS:  Current Outpatient Medications  Medication Sig Dispense Refill   abemaciclib (VERZENIO) 50 MG tablet Take 1 tablet (50 mg total) by mouth 2 (two) times daily. Swallow tablets whole. Do not chew, crush, or split tablets before swallowing. 56 tablet 3   acetaminophen (TYLENOL) 500 MG tablet Take 500 mg by mouth every 6 (six) hours as needed for moderate pain.     amLODipine (NORVASC) 2.5 MG tablet Take 7.5 mg by mouth daily. evening     atorvastatin (LIPITOR) 10 MG tablet Take 10 mg by mouth daily.     budesonide (RHINOCORT AQUA) 32 MCG/ACT nasal spray Place 2 sprays into both nostrils daily.     Cholecalciferol (VITAMIN D3) 2000 units TABS Take 2,000 Units by mouth daily.      Cholecalciferol 50 MCG (2000 UT) CAPS 1 tablet Orally once a day     diclofenac Sodium (VOLTAREN) 1 % GEL Apply 1 Application topically 3 (three) times daily as needed (pain).     diclofenac Sodium (VOLTAREN) 1 % GEL as directed Externally as needed     febuxostat (ULORIC) 40 MG tablet Take 40 mg by mouth daily.     hydrocortisone cream 1 % Apply 1 Application topically 2 (two) times daily as needed for itching.     letrozole (FEMARA) 2.5 MG tablet Take 1 tablet (2.5 mg total) by mouth daily. 90 tablet 3   Lidocaine 0.5 % AERO as directed Externally as needed     loratadine (CLARITIN) 10 MG tablet Take 10 mg by mouth 2 (two) times daily.     losartan (COZAAR) 100 MG tablet Take 100 mg by mouth daily.     Melatonin 1 MG CAPS Melatonin     melatonin 5 MG TABS Take 1 tablet (5 mg total) by mouth at bedtime as needed.  0   methylPREDNISolone (MEDROL DOSEPAK) 4 MG TBPK tablet Take 6 pills by mouth day 1, 5 on day 2, 4 on day 3, 3 on day 4, 2 on day 5, 1 on day 6 21 tablet 0   metoprolol succinate (TOPROL-XL) 50 MG 24 hr tablet Take 50 mg by mouth daily. At bedtime     Multiple Vitamins-Minerals (PRESERVISION AREDS 2+MULTI VIT PO) Take 1 capsule by mouth daily.     pantoprazole (PROTONIX) 40 MG tablet Take 40 mg by mouth daily.  PROAIR HFA 108 (90 BASE) MCG/ACT inhaler Inhale 2 puffs into the lungs every 6 (six) hours as needed (for respiratory issues.).      No current facility-administered medications for this visit.    PHYSICAL EXAMINATION: ECOG PERFORMANCE STATUS: {CHL ONC ECOG [id number]}  There were no vitals filed for this visit. There were no vitals filed for this visit.  BREAST:*** No palpable masses or nodules in either right or left breasts. No palpable axillary supraclavicular or infraclavicular adenopathy no breast tenderness or nipple discharge. (exam performed in the presence of a chaperone)  LABORATORY DATA:  I have reviewed the data as listed    Latest Ref Rng & Units  08/23/2022   11:45 AM 08/10/2022    2:10 PM 07/13/2022    1:20 PM  CMP  Glucose 70 - 99 mg/dL 96  161  096   BUN 8 - 23 mg/dL 66  68  63   Creatinine 0.44 - 1.00 mg/dL 0.45  4.09  8.11   Sodium 135 - 145 mmol/L 143  140  141   Potassium 3.5 - 5.1 mmol/L 4.8  4.9  4.1   Chloride 98 - 111 mmol/L 116  114  112   CO2 22 - 32 mmol/L 15  16  16    Calcium 8.9 - 10.3 mg/dL 8.8  8.7  9.2   Total Protein 6.5 - 8.1 g/dL 6.9  6.7  7.0   Total Bilirubin 0.3 - 1.2 mg/dL 0.4  0.4  0.4   Alkaline Phos 38 - 126 U/L 69  70  70   AST 15 - 41 U/L 14  15  14    ALT 0 - 44 U/L 11  12  10      Lab Results  Component Value Date   WBC 6.9 08/23/2022   HGB 8.5 (L) 08/23/2022   HCT 24.3 (L) 08/23/2022   MCV 94.2 08/23/2022   PLT 212 08/23/2022   NEUTROABS 5.2 08/23/2022    ASSESSMENT & PLAN:  No problem-specific Assessment & Plan notes found for this encounter.    No orders of the defined types were placed in this encounter.  The patient has a good understanding of the overall plan. she agrees with it. she will call with any problems that may develop before the next visit here. Total time spent: 30 mins including face to face time and time spent for planning, charting and co-ordination of care   Sherlyn Lick, CMA 09/01/22    I Janan Ridge am acting as a Neurosurgeon for [employer]  ***

## 2022-09-06 ENCOUNTER — Other Ambulatory Visit: Payer: Self-pay | Admitting: *Deleted

## 2022-09-06 DIAGNOSIS — Z170 Estrogen receptor positive status [ER+]: Secondary | ICD-10-CM

## 2022-09-07 ENCOUNTER — Inpatient Hospital Stay: Payer: Medicare Other | Admitting: Pharmacist

## 2022-09-07 ENCOUNTER — Inpatient Hospital Stay: Payer: Medicare Other | Admitting: Hematology and Oncology

## 2022-09-07 ENCOUNTER — Inpatient Hospital Stay: Payer: Medicare Other

## 2022-09-07 VITALS — BP 159/59 | HR 76 | Temp 97.6°F | Resp 18 | Ht 62.0 in | Wt 151.5 lb

## 2022-09-07 DIAGNOSIS — Z170 Estrogen receptor positive status [ER+]: Secondary | ICD-10-CM

## 2022-09-07 DIAGNOSIS — C50412 Malignant neoplasm of upper-outer quadrant of left female breast: Secondary | ICD-10-CM | POA: Diagnosis not present

## 2022-09-07 DIAGNOSIS — Z79811 Long term (current) use of aromatase inhibitors: Secondary | ICD-10-CM | POA: Diagnosis not present

## 2022-09-07 LAB — CMP (CANCER CENTER ONLY)
ALT: 11 U/L (ref 0–44)
AST: 13 U/L — ABNORMAL LOW (ref 15–41)
Albumin: 3.8 g/dL (ref 3.5–5.0)
Alkaline Phosphatase: 66 U/L (ref 38–126)
Anion gap: 11 (ref 5–15)
BUN: 62 mg/dL — ABNORMAL HIGH (ref 8–23)
CO2: 16 mmol/L — ABNORMAL LOW (ref 22–32)
Calcium: 8.2 mg/dL — ABNORMAL LOW (ref 8.9–10.3)
Chloride: 117 mmol/L — ABNORMAL HIGH (ref 98–111)
Creatinine: 4.38 mg/dL — ABNORMAL HIGH (ref 0.44–1.00)
GFR, Estimated: 10 mL/min — ABNORMAL LOW (ref >60)
Glucose, Bld: 99 mg/dL (ref 70–99)
Potassium: 4.3 mmol/L (ref 3.5–5.1)
Sodium: 144 mmol/L (ref 135–145)
Total Bilirubin: 0.5 mg/dL (ref 0.3–1.2)
Total Protein: 6.7 g/dL (ref 6.5–8.1)

## 2022-09-07 LAB — CBC WITH DIFFERENTIAL (CANCER CENTER ONLY)
Abs Immature Granulocytes: 0.13 10*3/uL — ABNORMAL HIGH (ref 0.00–0.07)
Basophils Absolute: 0 10*3/uL (ref 0.0–0.1)
Basophils Relative: 0 %
Eosinophils Absolute: 0.2 10*3/uL (ref 0.0–0.5)
Eosinophils Relative: 2 %
HCT: 24.2 % — ABNORMAL LOW (ref 36.0–46.0)
Hemoglobin: 8.4 g/dL — ABNORMAL LOW (ref 12.0–15.0)
Immature Granulocytes: 2 %
Lymphocytes Relative: 7 %
Lymphs Abs: 0.6 10*3/uL — ABNORMAL LOW (ref 0.7–4.0)
MCH: 32.3 pg (ref 26.0–34.0)
MCHC: 34.7 g/dL (ref 30.0–36.0)
MCV: 93.1 fL (ref 80.0–100.0)
Monocytes Absolute: 0.8 10*3/uL (ref 0.1–1.0)
Monocytes Relative: 10 %
Neutro Abs: 6.6 10*3/uL (ref 1.7–7.7)
Neutrophils Relative %: 79 %
Platelet Count: 214 10*3/uL (ref 150–400)
RBC: 2.6 MIL/uL — ABNORMAL LOW (ref 3.87–5.11)
RDW: 16 % — ABNORMAL HIGH (ref 11.5–15.5)
WBC Count: 8.3 10*3/uL (ref 4.0–10.5)
nRBC: 0 % (ref 0.0–0.2)

## 2022-09-07 MED ORDER — ANASTROZOLE 1 MG PO TABS: 1.0000 mg | ORAL_TABLET | Freq: Every day | ORAL | 3 refills | Status: DC

## 2022-09-07 NOTE — Assessment & Plan Note (Signed)
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  Multiple hypermetabolic bone lesions involving the spine most notable at L4, focus of hypermetabolism or vaginal cuff, right atrial appendage -------------------------------------------------------------------- Treatment plan:  Verzinio (started 04/11/2022) to letrozole Verzinio Toxicities: Diarrhea: Improved with Imodium.  We had to reduce the dosage to 50 twice daily. Anemia: Will transfuse 1 unit of PRBC.  If it persist then we will consider giving her Retacrit. We will add tumor markers to her blood work with each time.   07/10/2022: CT CAP: Interval decrease in the size of the soft tissue nodule in the right side of mid pelvic mesentery 2 cm (previously 2.7 cm), skin thickening left chest, patchy groundglass parenchymal opacities in the lungs right-sided renal atrophy  08/23/2022: High-resolution CT chest: Worsened irregular groundglass and consolidative airspace opacities especially in the right lung, coarse contour of the liver suggestive of cirrhosis  Recommendation: Stop Verzinio Refer to pulmonary Because the patient symptomatic with shortness of breath, I recommended treatment with steroids  Anemia: Secondary to Verzinio.  Status post blood transfusion without any significant symptomatic relief.  Will watch and monitor.  Recheck CT scans in 2 months and follow-up

## 2022-09-08 ENCOUNTER — Other Ambulatory Visit (HOSPITAL_COMMUNITY): Payer: Self-pay

## 2022-09-09 LAB — CANCER ANTIGEN 15-3: CA 15-3: 25.9 U/mL — ABNORMAL HIGH (ref 0.0–25.0)

## 2022-09-09 LAB — CANCER ANTIGEN 27.29: CA 27.29: 39.7 U/mL — ABNORMAL HIGH (ref 0.0–38.6)

## 2022-09-11 ENCOUNTER — Telehealth: Payer: Self-pay | Admitting: Hematology and Oncology

## 2022-09-11 NOTE — Telephone Encounter (Signed)
Scheduled appointments per 5/30 los. Patient is aware of the made appointments.

## 2022-10-08 NOTE — Progress Notes (Signed)
Patient Care Team: Georgann Housekeeper, MD as PCP - General (Internal Medicine) Jerene Bears, MD as Consulting Physician (Gynecology) Pershing Proud, RN as Oncology Nurse Navigator Donnelly Angelica, RN as Oncology Nurse Navigator Serena Croissant, MD as Consulting Physician (Hematology and Oncology) Tyler Pita, MD as Attending Physician (Nephrology)  DIAGNOSIS:  Encounter Diagnosis  Name Primary?   Malignant neoplasm of upper-outer quadrant of left breast in female, estrogen receptor positive (HCC) Yes    SUMMARY OF ONCOLOGIC HISTORY: Oncology History  Malignant neoplasm of upper-outer quadrant of left breast in female, estrogen receptor positive (HCC)  08/02/2021 Initial Diagnosis   Screening mammogram detected left breast distortion measuring 1.1 cm by ultrasound, negative axillary lymph nodes.  Biopsy revealed grade 2 ILC ER 100%, PR 95%, Ki-67 5%, HER2 IHC 2+, negative ratio 1.29   08/29/2021 Cancer Staging   Staging form: Breast, AJCC 8th Edition - Clinical stage from 08/29/2021: Stage IA (cT1c, cN0, cM0, G2, ER+, PR+, HER2-) - Signed by Serena Croissant, MD on 08/29/2021 Stage prefix: Initial diagnosis Histologic grading system: 3 grade system     CHIEF COMPLIANT: Follow-up on scans  INTERVAL HISTORY: Rachel Villarreal is a 77 y.o. female is here because of history of metastatic breast cancer. She presents to the clinic today for a follow-up. Pt reports that she still have SOB. She states that it subsides at rest.  Since Verzinio was stopped symptoms have improved.  She says bowels is back to normal since stopping the VerzInio. She says she does have some mild fatigue.  She complains of  symptoms of urinary tract infection.   ALLERGIES:  is allergic to ace inhibitors, lactose intolerance (gi), letrozole, lisinopril, mercury, and tape.  MEDICATIONS:  Current Outpatient Medications  Medication Sig Dispense Refill   ciprofloxacin (CIPRO) 500 MG tablet Take 1 tablet (500 mg  total) by mouth 2 (two) times daily. 14 tablet 0   acetaminophen (TYLENOL) 500 MG tablet Take 500 mg by mouth every 6 (six) hours as needed for moderate pain.     amLODipine (NORVASC) 2.5 MG tablet Take 7.5 mg by mouth daily. evening     anastrozole (ARIMIDEX) 1 MG tablet Take 1 tablet (1 mg total) by mouth daily. 90 tablet 3   atorvastatin (LIPITOR) 10 MG tablet Take 10 mg by mouth daily.     budesonide (RHINOCORT AQUA) 32 MCG/ACT nasal spray Place 2 sprays into both nostrils daily.     Cholecalciferol (VITAMIN D3) 2000 units TABS Take 2,000 Units by mouth daily.     Cholecalciferol 50 MCG (2000 UT) CAPS 1 tablet Orally once a day     diclofenac Sodium (VOLTAREN) 1 % GEL Apply 1 Application topically 3 (three) times daily as needed (pain).     febuxostat (ULORIC) 40 MG tablet Take 40 mg by mouth daily.     hydrocortisone cream 1 % Apply 1 Application topically 2 (two) times daily as needed for itching.     Lidocaine 0.5 % AERO as directed Externally as needed     loratadine (CLARITIN) 10 MG tablet Take 10 mg by mouth 2 (two) times daily.     losartan (COZAAR) 100 MG tablet Take 100 mg by mouth daily.     melatonin 5 MG TABS Take 1 tablet (5 mg total) by mouth at bedtime as needed.  0   methylPREDNISolone (MEDROL DOSEPAK) 4 MG TBPK tablet Take 6 pills by mouth day 1, 5 on day 2, 4 on day 3, 3 on day  4, 2 on day 5, 1 on day 6 21 tablet 0   metoprolol succinate (TOPROL-XL) 50 MG 24 hr tablet Take 50 mg by mouth daily. At bedtime     Multiple Vitamins-Minerals (PRESERVISION AREDS 2+MULTI VIT PO) Take 1 capsule by mouth daily.     pantoprazole (PROTONIX) 40 MG tablet Take 40 mg by mouth daily.     PROAIR HFA 108 (90 BASE) MCG/ACT inhaler Inhale 2 puffs into the lungs every 6 (six) hours as needed (for respiratory issues.).      No current facility-administered medications for this visit.    PHYSICAL EXAMINATION: ECOG PERFORMANCE STATUS: 1 - Symptomatic but completely ambulatory  Vitals:    10/16/22 1415  BP: (!) 156/60  Pulse: 77  Resp: 18  Temp: 97.9 F (36.6 C)  SpO2: 96%   Filed Weights   10/16/22 1415  Weight: 149 lb 11.2 oz (67.9 kg)      LABORATORY DATA:  I have reviewed the data as listed    Latest Ref Rng & Units 10/16/2022    1:40 PM 09/07/2022   11:05 AM 08/23/2022   11:45 AM  CMP  Glucose 70 - 99 mg/dL 409  99  96   BUN 8 - 23 mg/dL 73  62  66   Creatinine 0.44 - 1.00 mg/dL 8.11  9.14  7.82   Sodium 135 - 145 mmol/L 140  144  143   Potassium 3.5 - 5.1 mmol/L 4.8  4.3  4.8   Chloride 98 - 111 mmol/L 110  117  116   CO2 22 - 32 mmol/L 18  16  15    Calcium 8.9 - 10.3 mg/dL 9.3  8.2  8.8   Total Protein 6.5 - 8.1 g/dL 6.5  6.7  6.9   Total Bilirubin 0.3 - 1.2 mg/dL 0.4  0.5  0.4   Alkaline Phos 38 - 126 U/L 67  66  69   AST 15 - 41 U/L 12  13  14    ALT 0 - 44 U/L 7  11  11      Lab Results  Component Value Date   WBC 14.0 (H) 10/16/2022   HGB 9.2 (L) 10/16/2022   HCT 27.5 (L) 10/16/2022   MCV 95.8 10/16/2022   PLT 262 10/16/2022   NEUTROABS 11.8 (H) 10/16/2022    ASSESSMENT & PLAN:  Malignant neoplasm of upper-outer quadrant of left breast in female, estrogen receptor positive (HCC) Left Lumpectomy: 1.1 cm Grade 1 ILC , Positive Posterior margin, ER 100%, PR 95%, Ki-67 5%, HER2 IHC 2+, negative ratio 1.29 Margin excision 09/29/2021: Benign  Left lower quadrant abdominal pain: CT abdomen 02/07/2022: Right lower quadrant mesenteric mass, 5 mm right lung base nodule   PET CT scan 02/10/2022: 2.7 cm mesenteric mass is hypermetabolic consistent with neoplastic process.  Multiple hypermetabolic bone lesions involving the spine most notable at L4, focus of hypermetabolism or vaginal cuff, right atrial appendage -------------------------------------------------------------------- Treatment plan:  Verzinio (started 04/11/2022) to letrozole Verzinio Toxicities: Diarrhea: Improved with Imodium.  We had to reduce the dosage to 50 twice daily. Anemia: Will  transfuse 1 unit of PRBC.  If it persist then we will consider giving her Retacrit. We will add tumor markers to her blood work with each time.   07/10/2022: CT CAP: Interval decrease in the size of the soft tissue nodule in the right side of mid pelvic mesentery 2 cm (previously 2.7 cm), skin thickening left chest, patchy groundglass parenchymal opacities in the lungs  right-sided renal atrophy   08/23/2022: High-resolution CT chest: Worsened irregular groundglass and consolidative airspace opacities especially in the right lung, coarse contour of the liver suggestive of cirrhosis   Recommendation: Stopped Verzinio 08/23/2022 Switch letrozole to anastrozole (because of the maculopapular rash)   Anemia: Secondary to Verzinio.  Improving. UTI: I sent a prescription for ciprofloxacin.  CT CAP 10/11/2022: No new metastatic breast cancer identified.  New bilateral small pleural effusions, 1.4 cm wedge-shaped opacity right lower lobe needs follow-up.  Will continue the same treatment plan and rescan in 6 weeks based on radiologist recommendation to follow-up on the wedge-shaped opacity in the right lower lobe.    Orders Placed This Encounter  Procedures   CT Chest Wo Contrast    Standing Status:   Future    Standing Expiration Date:   10/16/2023    Order Specific Question:   Preferred imaging location?    Answer:   [facility]   The patient has a good understanding of the overall plan. she agrees with it. she will call with any problems that may develop before the next visit here. Total time spent: 30 mins including face to face time and time spent for planning, charting and co-ordination of care   Tamsen Meek, MD 10/16/22    I Janan Ridge am acting as a Neurosurgeon for [employer]  I have reviewed the above documentation for accuracy and completeness, and I agree with the above.

## 2022-10-09 DIAGNOSIS — E872 Acidosis, unspecified: Secondary | ICD-10-CM | POA: Diagnosis not present

## 2022-10-09 DIAGNOSIS — N185 Chronic kidney disease, stage 5: Secondary | ICD-10-CM | POA: Diagnosis not present

## 2022-10-09 DIAGNOSIS — D631 Anemia in chronic kidney disease: Secondary | ICD-10-CM | POA: Diagnosis not present

## 2022-10-09 DIAGNOSIS — M109 Gout, unspecified: Secondary | ICD-10-CM | POA: Diagnosis not present

## 2022-10-09 DIAGNOSIS — E7800 Pure hypercholesterolemia, unspecified: Secondary | ICD-10-CM | POA: Diagnosis not present

## 2022-10-09 DIAGNOSIS — I120 Hypertensive chronic kidney disease with stage 5 chronic kidney disease or end stage renal disease: Secondary | ICD-10-CM | POA: Diagnosis not present

## 2022-10-11 ENCOUNTER — Ambulatory Visit (HOSPITAL_COMMUNITY)
Admission: RE | Admit: 2022-10-11 | Discharge: 2022-10-11 | Disposition: A | Discharge status: Home / Self Care | Payer: Medicare Other | Source: Ambulatory Visit | Attending: Hematology and Oncology | Admitting: Hematology and Oncology

## 2022-10-11 DIAGNOSIS — Z853 Personal history of malignant neoplasm of breast: Secondary | ICD-10-CM | POA: Diagnosis not present

## 2022-10-11 DIAGNOSIS — Z170 Estrogen receptor positive status [ER+]: Secondary | ICD-10-CM | POA: Diagnosis not present

## 2022-10-11 DIAGNOSIS — I700 Atherosclerosis of aorta: Secondary | ICD-10-CM | POA: Diagnosis not present

## 2022-10-11 DIAGNOSIS — C50412 Malignant neoplasm of upper-outer quadrant of left female breast: Secondary | ICD-10-CM | POA: Diagnosis not present

## 2022-10-11 DIAGNOSIS — M799 Soft tissue disorder, unspecified: Secondary | ICD-10-CM | POA: Diagnosis not present

## 2022-10-11 DIAGNOSIS — I2510 Atherosclerotic heart disease of native coronary artery without angina pectoris: Secondary | ICD-10-CM | POA: Diagnosis not present

## 2022-10-16 ENCOUNTER — Telehealth: Payer: Self-pay

## 2022-10-16 ENCOUNTER — Inpatient Hospital Stay: Payer: Medicare Other | Attending: Hematology and Oncology

## 2022-10-16 ENCOUNTER — Inpatient Hospital Stay (HOSPITAL_BASED_OUTPATIENT_CLINIC_OR_DEPARTMENT_OTHER): Payer: Medicare Other | Admitting: Hematology and Oncology

## 2022-10-16 ENCOUNTER — Other Ambulatory Visit: Payer: Self-pay

## 2022-10-16 VITALS — BP 156/60 | HR 77 | Temp 97.9°F | Resp 18 | Ht 62.0 in | Wt 149.7 lb

## 2022-10-16 DIAGNOSIS — C50412 Malignant neoplasm of upper-outer quadrant of left female breast: Secondary | ICD-10-CM | POA: Diagnosis not present

## 2022-10-16 DIAGNOSIS — Z79811 Long term (current) use of aromatase inhibitors: Secondary | ICD-10-CM | POA: Insufficient documentation

## 2022-10-16 DIAGNOSIS — Z170 Estrogen receptor positive status [ER+]: Secondary | ICD-10-CM | POA: Insufficient documentation

## 2022-10-16 DIAGNOSIS — Z79899 Other long term (current) drug therapy: Secondary | ICD-10-CM | POA: Insufficient documentation

## 2022-10-16 LAB — CBC WITH DIFFERENTIAL (CANCER CENTER ONLY)
Abs Immature Granulocytes: 0.25 10*3/uL — ABNORMAL HIGH (ref 0.00–0.07)
Basophils Absolute: 0 10*3/uL (ref 0.0–0.1)
Basophils Relative: 0 %
Eosinophils Absolute: 0.3 10*3/uL (ref 0.0–0.5)
Eosinophils Relative: 2 %
HCT: 27.5 % — ABNORMAL LOW (ref 36.0–46.0)
Hemoglobin: 9.2 g/dL — ABNORMAL LOW (ref 12.0–15.0)
Immature Granulocytes: 2 %
Lymphocytes Relative: 5 %
Lymphs Abs: 0.7 10*3/uL (ref 0.7–4.0)
MCH: 32.1 pg (ref 26.0–34.0)
MCHC: 33.5 g/dL (ref 30.0–36.0)
MCV: 95.8 fL (ref 80.0–100.0)
Monocytes Absolute: 0.9 10*3/uL (ref 0.1–1.0)
Monocytes Relative: 7 %
Neutro Abs: 11.8 10*3/uL — ABNORMAL HIGH (ref 1.7–7.7)
Neutrophils Relative %: 84 %
Platelet Count: 262 10*3/uL (ref 150–400)
RBC: 2.87 MIL/uL — ABNORMAL LOW (ref 3.87–5.11)
RDW: 14 % (ref 11.5–15.5)
WBC Count: 14 10*3/uL — ABNORMAL HIGH (ref 4.0–10.5)
nRBC: 0 % (ref 0.0–0.2)

## 2022-10-16 LAB — CMP (CANCER CENTER ONLY)
ALT: 7 U/L (ref 0–44)
AST: 12 U/L — ABNORMAL LOW (ref 15–41)
Albumin: 3.6 g/dL (ref 3.5–5.0)
Alkaline Phosphatase: 67 U/L (ref 38–126)
Anion gap: 12 (ref 5–15)
BUN: 73 mg/dL — ABNORMAL HIGH (ref 8–23)
CO2: 18 mmol/L — ABNORMAL LOW (ref 22–32)
Calcium: 9.3 mg/dL (ref 8.9–10.3)
Chloride: 110 mmol/L (ref 98–111)
Creatinine: 5.56 mg/dL — ABNORMAL HIGH (ref 0.44–1.00)
GFR, Estimated: 7 mL/min — ABNORMAL LOW (ref >60)
Glucose, Bld: 108 mg/dL — ABNORMAL HIGH (ref 70–99)
Potassium: 4.8 mmol/L (ref 3.5–5.1)
Sodium: 140 mmol/L (ref 135–145)
Total Bilirubin: 0.4 mg/dL (ref 0.3–1.2)
Total Protein: 6.5 g/dL (ref 6.5–8.1)

## 2022-10-16 LAB — URINALYSIS, COMPLETE (UACMP) WITH MICROSCOPIC
Bilirubin Urine: NEGATIVE
Glucose, UA: NEGATIVE mg/dL
Ketones, ur: NEGATIVE mg/dL
Nitrite: NEGATIVE
Protein, ur: >=300 mg/dL — AB
Specific Gravity, Urine: 1.016 (ref 1.005–1.030)
WBC, UA: >50 WBC/hpf (ref 0–5)
pH: 5 (ref 5.0–8.0)

## 2022-10-16 MED ORDER — CIPROFLOXACIN HCL 500 MG PO TABS: 500.0000 mg | ORAL_TABLET | Freq: Two times a day (BID) | ORAL | 0 refills | Status: DC

## 2022-10-16 NOTE — Assessment & Plan Note (Signed)
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  Multiple hypermetabolic bone lesions involving the spine most notable at L4, focus of hypermetabolism or vaginal cuff, right atrial appendage -------------------------------------------------------------------- Treatment plan:  Verzinio (started 04/11/2022) to letrozole Verzinio Toxicities: Diarrhea: Improved with Imodium.  We had to reduce the dosage to 50 twice daily. Anemia: Will transfuse 1 unit of PRBC.  If it persist then we will consider giving her Retacrit. We will add tumor markers to her blood work with each time.   07/10/2022: CT CAP: Interval decrease in the size of the soft tissue nodule in the right side of mid pelvic mesentery 2 cm (previously 2.7 cm), skin thickening left chest, patchy groundglass parenchymal opacities in the lungs right-sided renal atrophy   08/23/2022: High-resolution CT chest: Worsened irregular groundglass and consolidative airspace opacities especially in the right lung, coarse contour of the liver suggestive of cirrhosis   Recommendation: Stopped Verzinio 08/23/2022 Switch letrozole to anastrozole (because of the maculopapular rash)   Anemia: Secondary to Verzinio.  Status post blood transfusion without any significant symptomatic relief.  Will watch and monitor.   CT CAP 10/11/2022: No new metastatic breast cancer identified.  New bilateral small pleural effusions, 1.4 cm wedge-shaped opacity right lower lobe needs follow-up.  Will continue the same treatment plan and rescan in 6 weeks based on radiologist recommendation to follow-up on the wedge-shaped opacity in the right lower  lobe.

## 2022-10-16 NOTE — Telephone Encounter (Signed)
Pt called and states she has had dysuria and nocturia the past couple of days and is concerned for UTI. Per MD, added UA/UC to labs for today. She is aware and verbalized thanks.

## 2022-10-17 ENCOUNTER — Telehealth: Payer: Self-pay | Admitting: Hematology and Oncology

## 2022-10-17 LAB — URINE CULTURE: Culture: NO GROWTH

## 2022-10-17 LAB — CANCER ANTIGEN 27.29: CA 27.29: 41.1 U/mL — ABNORMAL HIGH (ref 0.0–38.6)

## 2022-10-17 NOTE — Telephone Encounter (Signed)
Scheduled appointment per 7/8 los. Patient is aware of the made appointment.

## 2022-10-25 DIAGNOSIS — H401132 Primary open-angle glaucoma, bilateral, moderate stage: Secondary | ICD-10-CM | POA: Diagnosis not present

## 2022-10-25 DIAGNOSIS — H5213 Myopia, bilateral: Secondary | ICD-10-CM | POA: Diagnosis not present

## 2022-10-25 DIAGNOSIS — Z961 Presence of intraocular lens: Secondary | ICD-10-CM | POA: Diagnosis not present

## 2022-10-25 DIAGNOSIS — H353131 Nonexudative age-related macular degeneration, bilateral, early dry stage: Secondary | ICD-10-CM | POA: Diagnosis not present

## 2022-11-14 ENCOUNTER — Ambulatory Visit: Payer: Medicare Other | Admitting: Hematology and Oncology

## 2022-11-23 ENCOUNTER — Ambulatory Visit (HOSPITAL_COMMUNITY)
Admission: RE | Admit: 2022-11-23 | Discharge: 2022-11-23 | Disposition: A | Discharge status: Home / Self Care | Payer: Medicare Other | Source: Ambulatory Visit | Attending: Hematology and Oncology | Admitting: Hematology and Oncology

## 2022-11-23 DIAGNOSIS — C50412 Malignant neoplasm of upper-outer quadrant of left female breast: Secondary | ICD-10-CM | POA: Insufficient documentation

## 2022-11-23 DIAGNOSIS — R911 Solitary pulmonary nodule: Secondary | ICD-10-CM | POA: Diagnosis not present

## 2022-11-23 DIAGNOSIS — K8020 Calculus of gallbladder without cholecystitis without obstruction: Secondary | ICD-10-CM | POA: Diagnosis not present

## 2022-11-23 DIAGNOSIS — Z170 Estrogen receptor positive status [ER+]: Secondary | ICD-10-CM | POA: Insufficient documentation

## 2022-11-23 DIAGNOSIS — C50919 Malignant neoplasm of unspecified site of unspecified female breast: Secondary | ICD-10-CM | POA: Diagnosis not present

## 2022-11-23 DIAGNOSIS — K5730 Diverticulosis of large intestine without perforation or abscess without bleeding: Secondary | ICD-10-CM | POA: Diagnosis not present

## 2022-11-27 ENCOUNTER — Inpatient Hospital Stay: Payer: Medicare Other | Attending: Hematology and Oncology | Admitting: Hematology and Oncology

## 2022-11-27 ENCOUNTER — Other Ambulatory Visit: Payer: Self-pay

## 2022-11-27 VITALS — BP 140/53 | HR 77 | Temp 97.3°F | Resp 18 | Ht 62.0 in | Wt 150.1 lb

## 2022-11-27 DIAGNOSIS — Z79811 Long term (current) use of aromatase inhibitors: Secondary | ICD-10-CM | POA: Diagnosis not present

## 2022-11-27 DIAGNOSIS — C50412 Malignant neoplasm of upper-outer quadrant of left female breast: Secondary | ICD-10-CM

## 2022-11-27 DIAGNOSIS — Z170 Estrogen receptor positive status [ER+]: Secondary | ICD-10-CM | POA: Diagnosis not present

## 2022-11-27 NOTE — Assessment & Plan Note (Addendum)
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  Multiple hypermetabolic bone lesions involving the spine most notable at L4, focus of hypermetabolism or vaginal cuff, right atrial appendage -------------------------------------------------------------------- Treatment plan:  Verzinio (started 04/11/2022 stopped May 2024) to letrozole   07/10/2022: CT CAP: Interval decrease in the size of the soft tissue nodule in the right side of mid pelvic mesentery 2 cm (previously 2.7 cm), skin thickening left chest, patchy groundglass parenchymal opacities in the lungs right-sided renal atrophy   Recommendation: Stopped Verzinio 08/23/2022 Switch letrozole to anastrozole (because of the maculopapular rash)   Anemia: Secondary to Verzinio.  Improving.     CT CAP 10/11/2022: No new metastatic breast cancer identified.  New bilateral small pleural effusions, 1.4 cm wedge-shaped opacity right lower lobe needs follow-up.   CT CAP 11/23/2022: No new or progressive metastatic disease.  Wedge-shaped opacity significantly decreased.  Stable 1.8 cm mesenteric lymph node.  Labs and follow-up in 2 months.  Our plan is to obtain scans in 4 months.

## 2022-11-27 NOTE — Progress Notes (Signed)
Patient Care Team: Georgann Housekeeper, MD as PCP - General (Internal Medicine) Jerene Bears, MD as Consulting Physician (Gynecology) Pershing Proud, RN as Oncology Nurse Navigator Donnelly Angelica, RN as Oncology Nurse Navigator Serena Croissant, MD as Consulting Physician (Hematology and Oncology) Tyler Pita, MD as Attending Physician (Nephrology)  DIAGNOSIS:  Encounter Diagnosis  Name Primary?   Malignant neoplasm of upper-outer quadrant of left breast in female, estrogen receptor positive (HCC) Yes    SUMMARY OF ONCOLOGIC HISTORY: Oncology History  Malignant neoplasm of upper-outer quadrant of left breast in female, estrogen receptor positive (HCC)  08/02/2021 Initial Diagnosis   Screening mammogram detected left breast distortion measuring 1.1 cm by ultrasound, negative axillary lymph nodes.  Biopsy revealed grade 2 ILC ER 100%, PR 95%, Ki-67 5%, HER2 IHC 2+, negative ratio 1.29   08/29/2021 Cancer Staging   Staging form: Breast, AJCC 8th Edition - Clinical stage from 08/29/2021: Stage IA (cT1c, cN0, cM0, G2, ER+, PR+, HER2-) - Signed by Serena Croissant, MD on 08/29/2021 Stage prefix: Initial diagnosis Histologic grading system: 3 grade system     CHIEF COMPLIANT: Follow-up scans/ Anastrozole  INTERVAL HISTORY: [name] is a 77 y.o. female is here because of history of metastatic breast cancer. She presents to the clinic today for a follow-up. Patient reports that she does feel better. She has a feeling in the chest hard to describe it because she never had it before. Denies any hot flashes. Tolerating anastrozole extremely well with no side effects or complaints.   ALLERGIES:  is allergic to ace inhibitors, lactose intolerance (gi), letrozole, lisinopril, mercury, and tape.  MEDICATIONS:  Current Outpatient Medications  Medication Sig Dispense Refill   acetaminophen (TYLENOL) 500 MG tablet Take 500 mg by mouth every 6 (six) hours as needed for moderate pain.      amLODipine (NORVASC) 2.5 MG tablet Take 7.5 mg by mouth daily. evening     anastrozole (ARIMIDEX) 1 MG tablet Take 1 tablet (1 mg total) by mouth daily. 90 tablet 3   atorvastatin (LIPITOR) 10 MG tablet Take 10 mg by mouth daily.     budesonide (RHINOCORT AQUA) 32 MCG/ACT nasal spray Place 2 sprays into both nostrils daily.     Cholecalciferol 50 MCG (2000 UT) CAPS 1 tablet Orally once a day     diclofenac Sodium (VOLTAREN) 1 % GEL Apply 1 Application topically 3 (three) times daily as needed (pain).     febuxostat (ULORIC) 40 MG tablet Take 40 mg by mouth daily.     hydrocortisone cream 1 % Apply 1 Application topically 2 (two) times daily as needed for itching.     Lidocaine 0.5 % AERO as directed Externally as needed     loratadine (CLARITIN) 10 MG tablet Take 10 mg by mouth 2 (two) times daily.     losartan (COZAAR) 100 MG tablet Take 100 mg by mouth daily.     melatonin 5 MG TABS Take 1 tablet (5 mg total) by mouth at bedtime as needed.  0   metoprolol succinate (TOPROL-XL) 50 MG 24 hr tablet Take 50 mg by mouth daily. At bedtime     Multiple Vitamins-Minerals (PRESERVISION AREDS 2+MULTI VIT PO) Take 1 capsule by mouth daily.     pantoprazole (PROTONIX) 40 MG tablet Take 40 mg by mouth daily.     PROAIR HFA 108 (90 BASE) MCG/ACT inhaler Inhale 2 puffs into the lungs every 6 (six) hours as needed (for respiratory issues.).  No current facility-administered medications for this visit.    PHYSICAL EXAMINATION: ECOG PERFORMANCE STATUS: 1 - Symptomatic but completely ambulatory  Vitals:   11/27/22 1448  BP: (!) 140/53  Pulse: 77  Resp: 18  Temp: (!) 97.3 F (36.3 C)  SpO2: 96%   Filed Weights   11/27/22 1448  Weight: 150 lb 1.6 oz (68.1 kg)      LABORATORY DATA:  I have reviewed the data as listed    Latest Ref Rng & Units 10/16/2022    1:40 PM 09/07/2022   11:05 AM 08/23/2022   11:45 AM  CMP  Glucose 70 - 99 mg/dL 824  99  96   BUN 8 - 23 mg/dL 73  62  66   Creatinine  0.44 - 1.00 mg/dL 2.35  3.61  4.43   Sodium 135 - 145 mmol/L 140  144  143   Potassium 3.5 - 5.1 mmol/L 4.8  4.3  4.8   Chloride 98 - 111 mmol/L 110  117  116   CO2 22 - 32 mmol/L 18  16  15    Calcium 8.9 - 10.3 mg/dL 9.3  8.2  8.8   Total Protein 6.5 - 8.1 g/dL 6.5  6.7  6.9   Total Bilirubin 0.3 - 1.2 mg/dL 0.4  0.5  0.4   Alkaline Phos 38 - 126 U/L 67  66  69   AST 15 - 41 U/L 12  13  14    ALT 0 - 44 U/L 7  11  11      Lab Results  Component Value Date   WBC 14.0 (H) 10/16/2022   HGB 9.2 (L) 10/16/2022   HCT 27.5 (L) 10/16/2022   MCV 95.8 10/16/2022   PLT 262 10/16/2022   NEUTROABS 11.8 (H) 10/16/2022    ASSESSMENT & PLAN:  Malignant neoplasm of upper-outer quadrant of left breast in female, estrogen receptor positive (HCC) Left Lumpectomy: 1.1 cm Grade 1 ILC , Positive Posterior margin, ER 100%, PR 95%, Ki-67 5%, HER2 IHC 2+, negative ratio 1.29 Margin excision 09/29/2021: Benign  Left lower quadrant abdominal pain: CT abdomen 02/07/2022: Right lower quadrant mesenteric mass, 5 mm right lung base nodule   PET CT scan 02/10/2022: 2.7 cm mesenteric mass is hypermetabolic consistent with neoplastic process.  Multiple hypermetabolic bone lesions involving the spine most notable at L4, focus of hypermetabolism or vaginal cuff, right atrial appendage -------------------------------------------------------------------- Treatment plan:  Verzinio (started 04/11/2022 stopped May 2024) to letrozole   07/10/2022: CT CAP: Interval decrease in the size of the soft tissue nodule in the right side of mid pelvic mesentery 2 cm (previously 2.7 cm), skin thickening left chest, patchy groundglass parenchymal opacities in the lungs right-sided renal atrophy   Recommendation: Stopped Verzinio 08/23/2022 Switch letrozole to anastrozole (because of the maculopapular rash)   Anemia: Secondary to Verzinio.  Improving.     CT CAP 10/11/2022: No new metastatic breast cancer identified.  New bilateral small  pleural effusions, 1.4 cm wedge-shaped opacity right lower lobe needs follow-up.   CT CAP 11/23/2022: No new or progressive metastatic disease.  Wedge-shaped opacity significantly decreased.  Stable 1.8 cm mesenteric lymph node.  Labs and follow-up in 2 months.  Our plan is to obtain scans in 4 months.   Orders Placed This Encounter  Procedures   CBC with Differential (Cancer Center Only)    Standing Status:   Future    Standing Expiration Date:   11/27/2023   CMP (Cancer Center only)  Standing Status:   Future    Standing Expiration Date:   11/27/2023   Cancer antigen 27.29    Standing Status:   Standing    Number of Occurrences:   20    Standing Expiration Date:   11/27/2023   The patient has a good understanding of the overall plan. she agrees with it. she will call with any problems that may develop before the next visit here. Total time spent: 30 mins including face to face time and time spent for planning, charting and co-ordination of care   Rachel Meek, MD 11/27/22    I Janan Ridge am acting as a Neurosurgeon for [employer]  I have reviewed the above documentation for accuracy and completeness, and I agree with the above.

## 2022-12-01 DIAGNOSIS — N185 Chronic kidney disease, stage 5: Secondary | ICD-10-CM | POA: Diagnosis not present

## 2022-12-01 DIAGNOSIS — N189 Chronic kidney disease, unspecified: Secondary | ICD-10-CM | POA: Diagnosis not present

## 2022-12-01 DIAGNOSIS — I120 Hypertensive chronic kidney disease with stage 5 chronic kidney disease or end stage renal disease: Secondary | ICD-10-CM | POA: Diagnosis not present

## 2022-12-01 DIAGNOSIS — E7800 Pure hypercholesterolemia, unspecified: Secondary | ICD-10-CM | POA: Diagnosis not present

## 2022-12-01 DIAGNOSIS — R609 Edema, unspecified: Secondary | ICD-10-CM | POA: Diagnosis not present

## 2022-12-01 DIAGNOSIS — M109 Gout, unspecified: Secondary | ICD-10-CM | POA: Diagnosis not present

## 2022-12-01 DIAGNOSIS — D631 Anemia in chronic kidney disease: Secondary | ICD-10-CM | POA: Diagnosis not present

## 2022-12-01 DIAGNOSIS — E872 Acidosis, unspecified: Secondary | ICD-10-CM | POA: Diagnosis not present

## 2022-12-01 DIAGNOSIS — S61209A Unspecified open wound of unspecified finger without damage to nail, initial encounter: Secondary | ICD-10-CM | POA: Diagnosis not present

## 2022-12-07 DIAGNOSIS — N186 End stage renal disease: Secondary | ICD-10-CM | POA: Diagnosis not present

## 2022-12-07 DIAGNOSIS — Z992 Dependence on renal dialysis: Secondary | ICD-10-CM | POA: Diagnosis not present

## 2022-12-12 DIAGNOSIS — Z4901 Encounter for fitting and adjustment of extracorporeal dialysis catheter: Secondary | ICD-10-CM | POA: Diagnosis not present

## 2022-12-12 DIAGNOSIS — D631 Anemia in chronic kidney disease: Secondary | ICD-10-CM | POA: Insufficient documentation

## 2022-12-12 DIAGNOSIS — D689 Coagulation defect, unspecified: Secondary | ICD-10-CM | POA: Diagnosis not present

## 2022-12-12 DIAGNOSIS — N2581 Secondary hyperparathyroidism of renal origin: Secondary | ICD-10-CM | POA: Diagnosis not present

## 2022-12-12 DIAGNOSIS — Z992 Dependence on renal dialysis: Secondary | ICD-10-CM | POA: Diagnosis not present

## 2022-12-12 DIAGNOSIS — N186 End stage renal disease: Secondary | ICD-10-CM | POA: Diagnosis not present

## 2022-12-14 DIAGNOSIS — Z992 Dependence on renal dialysis: Secondary | ICD-10-CM | POA: Diagnosis not present

## 2022-12-14 DIAGNOSIS — N2581 Secondary hyperparathyroidism of renal origin: Secondary | ICD-10-CM | POA: Diagnosis not present

## 2022-12-14 DIAGNOSIS — Z4901 Encounter for fitting and adjustment of extracorporeal dialysis catheter: Secondary | ICD-10-CM | POA: Diagnosis not present

## 2022-12-14 DIAGNOSIS — N186 End stage renal disease: Secondary | ICD-10-CM | POA: Diagnosis not present

## 2022-12-16 DIAGNOSIS — Z992 Dependence on renal dialysis: Secondary | ICD-10-CM | POA: Diagnosis not present

## 2022-12-16 DIAGNOSIS — Z4901 Encounter for fitting and adjustment of extracorporeal dialysis catheter: Secondary | ICD-10-CM | POA: Diagnosis not present

## 2022-12-16 DIAGNOSIS — N186 End stage renal disease: Secondary | ICD-10-CM | POA: Diagnosis not present

## 2022-12-16 DIAGNOSIS — N2581 Secondary hyperparathyroidism of renal origin: Secondary | ICD-10-CM | POA: Diagnosis not present

## 2022-12-19 DIAGNOSIS — N186 End stage renal disease: Secondary | ICD-10-CM | POA: Diagnosis not present

## 2022-12-19 DIAGNOSIS — Z992 Dependence on renal dialysis: Secondary | ICD-10-CM | POA: Diagnosis not present

## 2022-12-19 DIAGNOSIS — N2581 Secondary hyperparathyroidism of renal origin: Secondary | ICD-10-CM | POA: Diagnosis not present

## 2022-12-19 DIAGNOSIS — Z4901 Encounter for fitting and adjustment of extracorporeal dialysis catheter: Secondary | ICD-10-CM | POA: Diagnosis not present

## 2022-12-21 DIAGNOSIS — N186 End stage renal disease: Secondary | ICD-10-CM | POA: Diagnosis not present

## 2022-12-21 DIAGNOSIS — N2581 Secondary hyperparathyroidism of renal origin: Secondary | ICD-10-CM | POA: Diagnosis not present

## 2022-12-21 DIAGNOSIS — Z992 Dependence on renal dialysis: Secondary | ICD-10-CM | POA: Diagnosis not present

## 2022-12-21 DIAGNOSIS — Z4901 Encounter for fitting and adjustment of extracorporeal dialysis catheter: Secondary | ICD-10-CM | POA: Diagnosis not present

## 2022-12-21 DIAGNOSIS — D631 Anemia in chronic kidney disease: Secondary | ICD-10-CM | POA: Diagnosis not present

## 2022-12-21 DIAGNOSIS — D689 Coagulation defect, unspecified: Secondary | ICD-10-CM | POA: Diagnosis not present

## 2022-12-23 DIAGNOSIS — N2581 Secondary hyperparathyroidism of renal origin: Secondary | ICD-10-CM | POA: Diagnosis not present

## 2022-12-23 DIAGNOSIS — D689 Coagulation defect, unspecified: Secondary | ICD-10-CM | POA: Diagnosis not present

## 2022-12-23 DIAGNOSIS — D631 Anemia in chronic kidney disease: Secondary | ICD-10-CM | POA: Diagnosis not present

## 2022-12-23 DIAGNOSIS — N186 End stage renal disease: Secondary | ICD-10-CM | POA: Diagnosis not present

## 2022-12-23 DIAGNOSIS — Z992 Dependence on renal dialysis: Secondary | ICD-10-CM | POA: Diagnosis not present

## 2022-12-23 DIAGNOSIS — Z4901 Encounter for fitting and adjustment of extracorporeal dialysis catheter: Secondary | ICD-10-CM | POA: Diagnosis not present

## 2022-12-26 DIAGNOSIS — N186 End stage renal disease: Secondary | ICD-10-CM | POA: Diagnosis not present

## 2022-12-26 DIAGNOSIS — D689 Coagulation defect, unspecified: Secondary | ICD-10-CM | POA: Diagnosis not present

## 2022-12-26 DIAGNOSIS — D631 Anemia in chronic kidney disease: Secondary | ICD-10-CM | POA: Diagnosis not present

## 2022-12-26 DIAGNOSIS — Z4901 Encounter for fitting and adjustment of extracorporeal dialysis catheter: Secondary | ICD-10-CM | POA: Diagnosis not present

## 2022-12-26 DIAGNOSIS — N2581 Secondary hyperparathyroidism of renal origin: Secondary | ICD-10-CM | POA: Diagnosis not present

## 2022-12-26 DIAGNOSIS — Z992 Dependence on renal dialysis: Secondary | ICD-10-CM | POA: Diagnosis not present

## 2022-12-28 DIAGNOSIS — Z4901 Encounter for fitting and adjustment of extracorporeal dialysis catheter: Secondary | ICD-10-CM | POA: Diagnosis not present

## 2022-12-28 DIAGNOSIS — D631 Anemia in chronic kidney disease: Secondary | ICD-10-CM | POA: Diagnosis not present

## 2022-12-28 DIAGNOSIS — N2581 Secondary hyperparathyroidism of renal origin: Secondary | ICD-10-CM | POA: Diagnosis not present

## 2022-12-28 DIAGNOSIS — Z992 Dependence on renal dialysis: Secondary | ICD-10-CM | POA: Diagnosis not present

## 2022-12-28 DIAGNOSIS — N186 End stage renal disease: Secondary | ICD-10-CM | POA: Diagnosis not present

## 2022-12-28 DIAGNOSIS — D689 Coagulation defect, unspecified: Secondary | ICD-10-CM | POA: Diagnosis not present

## 2022-12-29 DIAGNOSIS — C50412 Malignant neoplasm of upper-outer quadrant of left female breast: Secondary | ICD-10-CM | POA: Diagnosis not present

## 2022-12-30 DIAGNOSIS — N186 End stage renal disease: Secondary | ICD-10-CM | POA: Diagnosis not present

## 2022-12-30 DIAGNOSIS — D689 Coagulation defect, unspecified: Secondary | ICD-10-CM | POA: Diagnosis not present

## 2022-12-30 DIAGNOSIS — D631 Anemia in chronic kidney disease: Secondary | ICD-10-CM | POA: Diagnosis not present

## 2022-12-30 DIAGNOSIS — Z992 Dependence on renal dialysis: Secondary | ICD-10-CM | POA: Diagnosis not present

## 2022-12-30 DIAGNOSIS — Z4901 Encounter for fitting and adjustment of extracorporeal dialysis catheter: Secondary | ICD-10-CM | POA: Diagnosis not present

## 2022-12-30 DIAGNOSIS — N2581 Secondary hyperparathyroidism of renal origin: Secondary | ICD-10-CM | POA: Diagnosis not present

## 2023-01-02 DIAGNOSIS — Z992 Dependence on renal dialysis: Secondary | ICD-10-CM | POA: Diagnosis not present

## 2023-01-02 DIAGNOSIS — D689 Coagulation defect, unspecified: Secondary | ICD-10-CM | POA: Diagnosis not present

## 2023-01-02 DIAGNOSIS — D631 Anemia in chronic kidney disease: Secondary | ICD-10-CM | POA: Diagnosis not present

## 2023-01-02 DIAGNOSIS — Z4901 Encounter for fitting and adjustment of extracorporeal dialysis catheter: Secondary | ICD-10-CM | POA: Diagnosis not present

## 2023-01-02 DIAGNOSIS — N186 End stage renal disease: Secondary | ICD-10-CM | POA: Diagnosis not present

## 2023-01-02 DIAGNOSIS — N2581 Secondary hyperparathyroidism of renal origin: Secondary | ICD-10-CM | POA: Diagnosis not present

## 2023-01-04 ENCOUNTER — Other Ambulatory Visit: Payer: Self-pay | Admitting: *Deleted

## 2023-01-04 DIAGNOSIS — N184 Chronic kidney disease, stage 4 (severe): Secondary | ICD-10-CM

## 2023-01-04 DIAGNOSIS — N186 End stage renal disease: Secondary | ICD-10-CM | POA: Diagnosis not present

## 2023-01-04 DIAGNOSIS — D689 Coagulation defect, unspecified: Secondary | ICD-10-CM | POA: Diagnosis not present

## 2023-01-04 DIAGNOSIS — N2581 Secondary hyperparathyroidism of renal origin: Secondary | ICD-10-CM | POA: Diagnosis not present

## 2023-01-04 DIAGNOSIS — Z992 Dependence on renal dialysis: Secondary | ICD-10-CM | POA: Diagnosis not present

## 2023-01-04 DIAGNOSIS — Z4901 Encounter for fitting and adjustment of extracorporeal dialysis catheter: Secondary | ICD-10-CM | POA: Diagnosis not present

## 2023-01-04 DIAGNOSIS — D631 Anemia in chronic kidney disease: Secondary | ICD-10-CM | POA: Diagnosis not present

## 2023-01-06 DIAGNOSIS — N2581 Secondary hyperparathyroidism of renal origin: Secondary | ICD-10-CM | POA: Diagnosis not present

## 2023-01-06 DIAGNOSIS — D631 Anemia in chronic kidney disease: Secondary | ICD-10-CM | POA: Diagnosis not present

## 2023-01-06 DIAGNOSIS — Z4901 Encounter for fitting and adjustment of extracorporeal dialysis catheter: Secondary | ICD-10-CM | POA: Diagnosis not present

## 2023-01-06 DIAGNOSIS — D689 Coagulation defect, unspecified: Secondary | ICD-10-CM | POA: Diagnosis not present

## 2023-01-06 DIAGNOSIS — N186 End stage renal disease: Secondary | ICD-10-CM | POA: Diagnosis not present

## 2023-01-06 DIAGNOSIS — Z992 Dependence on renal dialysis: Secondary | ICD-10-CM | POA: Diagnosis not present

## 2023-01-08 DIAGNOSIS — Z992 Dependence on renal dialysis: Secondary | ICD-10-CM | POA: Diagnosis not present

## 2023-01-08 DIAGNOSIS — N186 End stage renal disease: Secondary | ICD-10-CM | POA: Diagnosis not present

## 2023-01-08 DIAGNOSIS — I120 Hypertensive chronic kidney disease with stage 5 chronic kidney disease or end stage renal disease: Secondary | ICD-10-CM | POA: Diagnosis not present

## 2023-01-09 DIAGNOSIS — N186 End stage renal disease: Secondary | ICD-10-CM | POA: Diagnosis not present

## 2023-01-09 DIAGNOSIS — N2581 Secondary hyperparathyroidism of renal origin: Secondary | ICD-10-CM | POA: Diagnosis not present

## 2023-01-09 DIAGNOSIS — Z23 Encounter for immunization: Secondary | ICD-10-CM | POA: Diagnosis not present

## 2023-01-09 DIAGNOSIS — D631 Anemia in chronic kidney disease: Secondary | ICD-10-CM | POA: Diagnosis not present

## 2023-01-09 DIAGNOSIS — Z4901 Encounter for fitting and adjustment of extracorporeal dialysis catheter: Secondary | ICD-10-CM | POA: Diagnosis not present

## 2023-01-09 DIAGNOSIS — D689 Coagulation defect, unspecified: Secondary | ICD-10-CM | POA: Diagnosis not present

## 2023-01-09 DIAGNOSIS — Z992 Dependence on renal dialysis: Secondary | ICD-10-CM | POA: Diagnosis not present

## 2023-01-09 DIAGNOSIS — E876 Hypokalemia: Secondary | ICD-10-CM | POA: Diagnosis not present

## 2023-01-09 NOTE — Progress Notes (Unsigned)
Office Note     CC:  ESRD Requesting Provider:  Tyler Pita, MD  HPI: Rachel Villarreal is a Right handed 77 y.o. (May 23, 1945) female with kidney disease who presents at the request of Tyler Pita, MD for permanent HD access. The patient has had 1 prior access procedure -right arm brachiocephalic fistula performed by my partner Dr. Edilia Bo. Per pt, previous tunneled lines have been placed in right IJ.  She is currently being accessed through the right IJ TDC.  At her last visit several months ago, Darel Hong was told that the right arm brachiocephalic fistula needed more time to mature.  She started dialysis last month, and has been using the tunnel dialysis catheter.  She presents today for right upper extremity fistula assessment.    On exam, Darel Hong was doing well.  She denies symptoms of steal syndrome.    Past Medical History:  Diagnosis Date   Abnormal Pap smear of vagina    Anemia    Asthma    Mild   Cancer (HCC)    left breast ILC   Chronic kidney disease (CKD)    stage 4   GERD (gastroesophageal reflux disease)    Gout 05/2012   Hernia, incisional    History of abnormal Pap smear    CIN III, 2001   History of blood transfusion    POST OP   History of uterine fibroid    Hypertension    Peritonitis (HCC)    Respiratory failure (HCC)    after ruptured appendix   Ruptured appendix    Seasonal allergies    Shingles 02/2013   very mild case   Urine incontinence     Past Surgical History:  Procedure Laterality Date   APPENDECTOMY     AV FISTULA PLACEMENT Right 04/24/2022   Procedure: RIGHT BRACHIOCEPHALIC ARTERIOVENOUS (AV) FISTULA CREATION;  Surgeon: Chuck Hint, MD;  Location: [facility] OR;  Service: Vascular;  Laterality: Right;   BREAST BIOPSY Right    fibrocystic changes   BREAST BIOPSY Left 08/02/2021   BREAST LUMPECTOMY WITH RADIOACTIVE SEED LOCALIZATION Left 09/12/2021   Procedure: LEFT BREAST LUMPECTOMY WITH RADIOACTIVE SEED LOCALIZATION;  Surgeon:  Emelia Loron, MD;  Location:  SURGERY CENTER;  Service: General;  Laterality: Left;   CATARACT EXTRACTION Bilateral    COLONOSCOPY     COLONOSCOPY WITH PROPOFOL N/A 02/18/2018   Procedure: COLONOSCOPY WITH PROPOFOL;  Surgeon: Vida Rigger, MD;  Location: [facility];  Service: Endoscopy;  Laterality: N/A;  Use ultraslim scope    LEEP  2000   CIN 2/3   RE-EXCISION OF BREAST LUMPECTOMY Left 09/29/2021   Procedure: LEFT  BREAST RE-EXCISION LUMPECTOMY;  Surgeon: Emelia Loron, MD;  Location: [facility] ORS;  Service: General;  Laterality: Left;   REFRACTIVE SURGERY     SUPRACERVICAL ABDOMINAL HYSTERECTOMY  2001    Social History   Socioeconomic History   Marital status: Divorced    Spouse name: Not on file   Number of children: Not on file   Years of education: Not on file   Highest education level: Not on file  Occupational History   Not on file  Tobacco Use   Smoking status: Former    Current packs/day: 0.00    Types: Cigarettes    Quit date: 04/10/1972    Years since quitting: 50.7   Smokeless tobacco: Never  Vaping Use   Vaping status: Never Used  Substance and Sexual Activity   Alcohol use: No   Drug use:  No   Sexual activity: Not Currently    Partners: Male    Birth control/protection: Surgical    Comment: hysterectomy  Other Topics Concern   Not on file  Social History Narrative   Not on file   Social Determinants of Health   Financial Resource Strain: Low Risk  (09/02/2021)   Overall Financial Resource Strain (CARDIA)    Difficulty of Paying Living Expenses: Not hard at all  Food Insecurity: No Food Insecurity (09/02/2021)   Hunger Vital Sign    Worried About Running Out of Food in the Last Year: Never true    Ran Out of Food in the Last Year: Never true  Transportation Needs: No Transportation Needs (09/02/2021)   PRAPARE - Administrator, Civil Service (Medical): No    Lack of Transportation (Non-Medical): No  Physical Activity: Not on  file  Stress: No Stress Concern Present (09/02/2021)   [employer] of Occupational Health - Occupational Stress Questionnaire    Feeling of Stress : Not at all  Social Connections: Not on file  Intimate Partner Violence: Not on file   Family History  Problem Relation Age of Onset   Colon cancer Father    Breast cancer Neg Hx     Current Outpatient Medications  Medication Sig Dispense Refill   acetaminophen (TYLENOL) 500 MG tablet Take 500 mg by mouth every 6 (six) hours as needed for moderate pain.     amLODipine (NORVASC) 2.5 MG tablet Take 7.5 mg by mouth daily. evening     anastrozole (ARIMIDEX) 1 MG tablet Take 1 tablet (1 mg total) by mouth daily. 90 tablet 3   atorvastatin (LIPITOR) 10 MG tablet Take 10 mg by mouth daily.     budesonide (RHINOCORT AQUA) 32 MCG/ACT nasal spray Place 2 sprays into both nostrils daily.     Cholecalciferol 50 MCG (2000 UT) CAPS 1 tablet Orally once a day     diclofenac Sodium (VOLTAREN) 1 % GEL Apply 1 Application topically 3 (three) times daily as needed (pain).     febuxostat (ULORIC) 40 MG tablet Take 40 mg by mouth daily.     hydrocortisone cream 1 % Apply 1 Application topically 2 (two) times daily as needed for itching.     Lidocaine 0.5 % AERO as directed Externally as needed     loratadine (CLARITIN) 10 MG tablet Take 10 mg by mouth 2 (two) times daily.     losartan (COZAAR) 100 MG tablet Take 100 mg by mouth daily.     melatonin 5 MG TABS Take 1 tablet (5 mg total) by mouth at bedtime as needed.  0   metoprolol succinate (TOPROL-XL) 50 MG 24 hr tablet Take 50 mg by mouth daily. At bedtime     Multiple Vitamins-Minerals (PRESERVISION AREDS 2+MULTI VIT PO) Take 1 capsule by mouth daily.     pantoprazole (PROTONIX) 40 MG tablet Take 40 mg by mouth daily.     PROAIR HFA 108 (90 BASE) MCG/ACT inhaler Inhale 2 puffs into the lungs every 6 (six) hours as needed (for respiratory issues.).      No current facility-administered medications for  this visit.    Allergies  Allergen Reactions   Ace Inhibitors     angioedema   Lactose Intolerance (Gi)    Letrozole Hives   Lisinopril Swelling   Mercury     Red eyes, through eye drops that contained thimerosal    Tape Dermatitis    plastic  REVIEW OF SYSTEMS:  [X]  denotes positive finding, [ ]  denotes negative finding Cardiac  Comments:  Chest pain or chest pressure:    Shortness of breath upon exertion:    Short of breath when lying flat:    Irregular heart rhythm:        Vascular    Pain in calf, thigh, or hip brought on by ambulation:    Pain in feet at night that wakes you up from your sleep:     Blood clot in your veins:    Leg swelling:         Pulmonary    Oxygen at home:    Productive cough:     Wheezing:         Neurologic    Sudden weakness in arms or legs:     Sudden numbness in arms or legs:     Sudden onset of difficulty speaking or slurred speech:    Temporary loss of vision in one eye:     Problems with dizziness:         Gastrointestinal    Blood in stool:     Vomited blood:         Genitourinary    Burning when urinating:     Blood in urine:        Psychiatric    Major depression:         Hematologic    Bleeding problems:    Problems with blood clotting too easily:        Skin    Rashes or ulcers:        Constitutional    Fever or chills:      PHYSICAL EXAMINATION:  There were no vitals filed for this visit.  General:  WDWN in NAD; vital signs documented above Gait: Not observed HENT: WNL, normocephalic Pulmonary: normal non-labored breathing , without Rales, rhonchi,  wheezing Cardiac: regular HR, Abdomen: soft, NT, no masses Skin: without rashes Vascular Exam/Pulses:  Right Left  Radial 2+ (normal) 2+ (normal)                       Extremities: without ischemic changes, without Gangrene , without cellulitis; without open wounds;  Excellent  Musculoskeletal: no muscle wasting or atrophy  Neurologic: A&O X  3;  No focal weakness or paresthesias are detected Psychiatric:  The pt has Normal affect.   ASSESSMENT/PLAN:  KENNLEY CIARAVINO is a 77 y.o. female who presents with end stage renal disease  Imaging was reviewed, the right-sided brachiocephalic fistula is ready for use.  There is an excellent thrill with substantial flow, and sizable diameter.  Victorino Sparrow, MD [facility] [phone]

## 2023-01-11 ENCOUNTER — Encounter: Payer: Self-pay | Admitting: Vascular Surgery

## 2023-01-11 ENCOUNTER — Ambulatory Visit: Payer: Medicare Other | Admitting: Vascular Surgery

## 2023-01-11 ENCOUNTER — Ambulatory Visit (HOSPITAL_COMMUNITY)
Admission: RE | Admit: 2023-01-11 | Discharge: 2023-01-11 | Disposition: A | Discharge status: Home / Self Care | Payer: Medicare Other | Source: Ambulatory Visit | Attending: Vascular Surgery | Admitting: Vascular Surgery

## 2023-01-11 ENCOUNTER — Ambulatory Visit (INDEPENDENT_AMBULATORY_CARE_PROVIDER_SITE_OTHER)
Admission: RE | Admit: 2023-01-11 | Discharge: 2023-01-11 | Disposition: A | Discharge status: Home / Self Care | Payer: Medicare Other | Source: Ambulatory Visit | Attending: Vascular Surgery | Admitting: Vascular Surgery

## 2023-01-11 VITALS — BP 156/64 | HR 75 | Temp 98.4°F | Resp 20 | Ht 62.0 in | Wt 141.8 lb

## 2023-01-11 DIAGNOSIS — N186 End stage renal disease: Secondary | ICD-10-CM | POA: Diagnosis not present

## 2023-01-11 DIAGNOSIS — N184 Chronic kidney disease, stage 4 (severe): Secondary | ICD-10-CM | POA: Insufficient documentation

## 2023-01-12 DIAGNOSIS — Z4901 Encounter for fitting and adjustment of extracorporeal dialysis catheter: Secondary | ICD-10-CM | POA: Diagnosis not present

## 2023-01-12 DIAGNOSIS — Z23 Encounter for immunization: Secondary | ICD-10-CM | POA: Diagnosis not present

## 2023-01-12 DIAGNOSIS — N2581 Secondary hyperparathyroidism of renal origin: Secondary | ICD-10-CM | POA: Diagnosis not present

## 2023-01-12 DIAGNOSIS — D689 Coagulation defect, unspecified: Secondary | ICD-10-CM | POA: Diagnosis not present

## 2023-01-12 DIAGNOSIS — D631 Anemia in chronic kidney disease: Secondary | ICD-10-CM | POA: Diagnosis not present

## 2023-01-12 DIAGNOSIS — N186 End stage renal disease: Secondary | ICD-10-CM | POA: Diagnosis not present

## 2023-01-12 DIAGNOSIS — Z992 Dependence on renal dialysis: Secondary | ICD-10-CM | POA: Diagnosis not present

## 2023-01-12 DIAGNOSIS — E876 Hypokalemia: Secondary | ICD-10-CM | POA: Diagnosis not present

## 2023-01-13 DIAGNOSIS — Z4901 Encounter for fitting and adjustment of extracorporeal dialysis catheter: Secondary | ICD-10-CM | POA: Diagnosis not present

## 2023-01-13 DIAGNOSIS — N186 End stage renal disease: Secondary | ICD-10-CM | POA: Diagnosis not present

## 2023-01-13 DIAGNOSIS — E876 Hypokalemia: Secondary | ICD-10-CM | POA: Diagnosis not present

## 2023-01-13 DIAGNOSIS — Z992 Dependence on renal dialysis: Secondary | ICD-10-CM | POA: Diagnosis not present

## 2023-01-13 DIAGNOSIS — D689 Coagulation defect, unspecified: Secondary | ICD-10-CM | POA: Diagnosis not present

## 2023-01-13 DIAGNOSIS — N2581 Secondary hyperparathyroidism of renal origin: Secondary | ICD-10-CM | POA: Diagnosis not present

## 2023-01-13 DIAGNOSIS — Z23 Encounter for immunization: Secondary | ICD-10-CM | POA: Diagnosis not present

## 2023-01-13 DIAGNOSIS — D631 Anemia in chronic kidney disease: Secondary | ICD-10-CM | POA: Diagnosis not present

## 2023-01-16 DIAGNOSIS — N2581 Secondary hyperparathyroidism of renal origin: Secondary | ICD-10-CM | POA: Diagnosis not present

## 2023-01-16 DIAGNOSIS — D689 Coagulation defect, unspecified: Secondary | ICD-10-CM | POA: Diagnosis not present

## 2023-01-16 DIAGNOSIS — Z23 Encounter for immunization: Secondary | ICD-10-CM | POA: Diagnosis not present

## 2023-01-16 DIAGNOSIS — E876 Hypokalemia: Secondary | ICD-10-CM | POA: Diagnosis not present

## 2023-01-16 DIAGNOSIS — Z992 Dependence on renal dialysis: Secondary | ICD-10-CM | POA: Diagnosis not present

## 2023-01-16 DIAGNOSIS — D631 Anemia in chronic kidney disease: Secondary | ICD-10-CM | POA: Diagnosis not present

## 2023-01-16 DIAGNOSIS — N186 End stage renal disease: Secondary | ICD-10-CM | POA: Diagnosis not present

## 2023-01-16 DIAGNOSIS — Z4901 Encounter for fitting and adjustment of extracorporeal dialysis catheter: Secondary | ICD-10-CM | POA: Diagnosis not present

## 2023-01-18 DIAGNOSIS — D689 Coagulation defect, unspecified: Secondary | ICD-10-CM | POA: Diagnosis not present

## 2023-01-18 DIAGNOSIS — Z23 Encounter for immunization: Secondary | ICD-10-CM | POA: Diagnosis not present

## 2023-01-18 DIAGNOSIS — Z992 Dependence on renal dialysis: Secondary | ICD-10-CM | POA: Diagnosis not present

## 2023-01-18 DIAGNOSIS — N2581 Secondary hyperparathyroidism of renal origin: Secondary | ICD-10-CM | POA: Diagnosis not present

## 2023-01-18 DIAGNOSIS — D631 Anemia in chronic kidney disease: Secondary | ICD-10-CM | POA: Diagnosis not present

## 2023-01-18 DIAGNOSIS — Z4901 Encounter for fitting and adjustment of extracorporeal dialysis catheter: Secondary | ICD-10-CM | POA: Diagnosis not present

## 2023-01-18 DIAGNOSIS — E876 Hypokalemia: Secondary | ICD-10-CM | POA: Diagnosis not present

## 2023-01-18 DIAGNOSIS — N186 End stage renal disease: Secondary | ICD-10-CM | POA: Diagnosis not present

## 2023-01-20 DIAGNOSIS — E876 Hypokalemia: Secondary | ICD-10-CM | POA: Diagnosis not present

## 2023-01-20 DIAGNOSIS — D631 Anemia in chronic kidney disease: Secondary | ICD-10-CM | POA: Diagnosis not present

## 2023-01-20 DIAGNOSIS — Z992 Dependence on renal dialysis: Secondary | ICD-10-CM | POA: Diagnosis not present

## 2023-01-20 DIAGNOSIS — N2581 Secondary hyperparathyroidism of renal origin: Secondary | ICD-10-CM | POA: Diagnosis not present

## 2023-01-20 DIAGNOSIS — D689 Coagulation defect, unspecified: Secondary | ICD-10-CM | POA: Diagnosis not present

## 2023-01-20 DIAGNOSIS — N186 End stage renal disease: Secondary | ICD-10-CM | POA: Diagnosis not present

## 2023-01-20 DIAGNOSIS — Z23 Encounter for immunization: Secondary | ICD-10-CM | POA: Diagnosis not present

## 2023-01-20 DIAGNOSIS — Z4901 Encounter for fitting and adjustment of extracorporeal dialysis catheter: Secondary | ICD-10-CM | POA: Diagnosis not present

## 2023-01-23 DIAGNOSIS — Z23 Encounter for immunization: Secondary | ICD-10-CM | POA: Diagnosis not present

## 2023-01-23 DIAGNOSIS — E876 Hypokalemia: Secondary | ICD-10-CM | POA: Diagnosis not present

## 2023-01-23 DIAGNOSIS — Z992 Dependence on renal dialysis: Secondary | ICD-10-CM | POA: Diagnosis not present

## 2023-01-23 DIAGNOSIS — D689 Coagulation defect, unspecified: Secondary | ICD-10-CM | POA: Diagnosis not present

## 2023-01-23 DIAGNOSIS — D631 Anemia in chronic kidney disease: Secondary | ICD-10-CM | POA: Diagnosis not present

## 2023-01-23 DIAGNOSIS — N2581 Secondary hyperparathyroidism of renal origin: Secondary | ICD-10-CM | POA: Diagnosis not present

## 2023-01-23 DIAGNOSIS — N186 End stage renal disease: Secondary | ICD-10-CM | POA: Diagnosis not present

## 2023-01-23 DIAGNOSIS — Z4901 Encounter for fitting and adjustment of extracorporeal dialysis catheter: Secondary | ICD-10-CM | POA: Diagnosis not present

## 2023-01-25 DIAGNOSIS — Z4901 Encounter for fitting and adjustment of extracorporeal dialysis catheter: Secondary | ICD-10-CM | POA: Diagnosis not present

## 2023-01-25 DIAGNOSIS — D631 Anemia in chronic kidney disease: Secondary | ICD-10-CM | POA: Diagnosis not present

## 2023-01-25 DIAGNOSIS — Z23 Encounter for immunization: Secondary | ICD-10-CM | POA: Diagnosis not present

## 2023-01-25 DIAGNOSIS — D689 Coagulation defect, unspecified: Secondary | ICD-10-CM | POA: Diagnosis not present

## 2023-01-25 DIAGNOSIS — N2581 Secondary hyperparathyroidism of renal origin: Secondary | ICD-10-CM | POA: Diagnosis not present

## 2023-01-25 DIAGNOSIS — E876 Hypokalemia: Secondary | ICD-10-CM | POA: Diagnosis not present

## 2023-01-25 DIAGNOSIS — N186 End stage renal disease: Secondary | ICD-10-CM | POA: Diagnosis not present

## 2023-01-25 DIAGNOSIS — Z992 Dependence on renal dialysis: Secondary | ICD-10-CM | POA: Diagnosis not present

## 2023-01-27 DIAGNOSIS — E876 Hypokalemia: Secondary | ICD-10-CM | POA: Diagnosis not present

## 2023-01-27 DIAGNOSIS — N186 End stage renal disease: Secondary | ICD-10-CM | POA: Diagnosis not present

## 2023-01-27 DIAGNOSIS — Z23 Encounter for immunization: Secondary | ICD-10-CM | POA: Diagnosis not present

## 2023-01-27 DIAGNOSIS — Z4901 Encounter for fitting and adjustment of extracorporeal dialysis catheter: Secondary | ICD-10-CM | POA: Diagnosis not present

## 2023-01-27 DIAGNOSIS — Z992 Dependence on renal dialysis: Secondary | ICD-10-CM | POA: Diagnosis not present

## 2023-01-27 DIAGNOSIS — N2581 Secondary hyperparathyroidism of renal origin: Secondary | ICD-10-CM | POA: Diagnosis not present

## 2023-01-27 DIAGNOSIS — D689 Coagulation defect, unspecified: Secondary | ICD-10-CM | POA: Diagnosis not present

## 2023-01-27 DIAGNOSIS — D631 Anemia in chronic kidney disease: Secondary | ICD-10-CM | POA: Diagnosis not present

## 2023-01-30 DIAGNOSIS — D689 Coagulation defect, unspecified: Secondary | ICD-10-CM | POA: Diagnosis not present

## 2023-01-30 DIAGNOSIS — Z23 Encounter for immunization: Secondary | ICD-10-CM | POA: Diagnosis not present

## 2023-01-30 DIAGNOSIS — Z992 Dependence on renal dialysis: Secondary | ICD-10-CM | POA: Diagnosis not present

## 2023-01-30 DIAGNOSIS — Z4901 Encounter for fitting and adjustment of extracorporeal dialysis catheter: Secondary | ICD-10-CM | POA: Diagnosis not present

## 2023-01-30 DIAGNOSIS — E876 Hypokalemia: Secondary | ICD-10-CM | POA: Diagnosis not present

## 2023-01-30 DIAGNOSIS — N2581 Secondary hyperparathyroidism of renal origin: Secondary | ICD-10-CM | POA: Diagnosis not present

## 2023-01-30 DIAGNOSIS — D631 Anemia in chronic kidney disease: Secondary | ICD-10-CM | POA: Diagnosis not present

## 2023-01-30 DIAGNOSIS — N186 End stage renal disease: Secondary | ICD-10-CM | POA: Diagnosis not present

## 2023-02-01 DIAGNOSIS — Z23 Encounter for immunization: Secondary | ICD-10-CM | POA: Diagnosis not present

## 2023-02-01 DIAGNOSIS — D631 Anemia in chronic kidney disease: Secondary | ICD-10-CM | POA: Diagnosis not present

## 2023-02-01 DIAGNOSIS — Z4901 Encounter for fitting and adjustment of extracorporeal dialysis catheter: Secondary | ICD-10-CM | POA: Diagnosis not present

## 2023-02-01 DIAGNOSIS — N2581 Secondary hyperparathyroidism of renal origin: Secondary | ICD-10-CM | POA: Diagnosis not present

## 2023-02-01 DIAGNOSIS — E876 Hypokalemia: Secondary | ICD-10-CM | POA: Diagnosis not present

## 2023-02-01 DIAGNOSIS — N186 End stage renal disease: Secondary | ICD-10-CM | POA: Diagnosis not present

## 2023-02-01 DIAGNOSIS — D689 Coagulation defect, unspecified: Secondary | ICD-10-CM | POA: Diagnosis not present

## 2023-02-01 DIAGNOSIS — Z992 Dependence on renal dialysis: Secondary | ICD-10-CM | POA: Diagnosis not present

## 2023-02-03 DIAGNOSIS — D631 Anemia in chronic kidney disease: Secondary | ICD-10-CM | POA: Diagnosis not present

## 2023-02-03 DIAGNOSIS — N186 End stage renal disease: Secondary | ICD-10-CM | POA: Diagnosis not present

## 2023-02-03 DIAGNOSIS — Z992 Dependence on renal dialysis: Secondary | ICD-10-CM | POA: Diagnosis not present

## 2023-02-03 DIAGNOSIS — D689 Coagulation defect, unspecified: Secondary | ICD-10-CM | POA: Diagnosis not present

## 2023-02-03 DIAGNOSIS — N2581 Secondary hyperparathyroidism of renal origin: Secondary | ICD-10-CM | POA: Diagnosis not present

## 2023-02-03 DIAGNOSIS — Z23 Encounter for immunization: Secondary | ICD-10-CM | POA: Diagnosis not present

## 2023-02-03 DIAGNOSIS — E876 Hypokalemia: Secondary | ICD-10-CM | POA: Diagnosis not present

## 2023-02-03 DIAGNOSIS — Z4901 Encounter for fitting and adjustment of extracorporeal dialysis catheter: Secondary | ICD-10-CM | POA: Diagnosis not present

## 2023-02-06 DIAGNOSIS — Z23 Encounter for immunization: Secondary | ICD-10-CM | POA: Diagnosis not present

## 2023-02-06 DIAGNOSIS — Z4901 Encounter for fitting and adjustment of extracorporeal dialysis catheter: Secondary | ICD-10-CM | POA: Diagnosis not present

## 2023-02-06 DIAGNOSIS — D631 Anemia in chronic kidney disease: Secondary | ICD-10-CM | POA: Diagnosis not present

## 2023-02-06 DIAGNOSIS — E876 Hypokalemia: Secondary | ICD-10-CM | POA: Diagnosis not present

## 2023-02-06 DIAGNOSIS — Z992 Dependence on renal dialysis: Secondary | ICD-10-CM | POA: Diagnosis not present

## 2023-02-06 DIAGNOSIS — N186 End stage renal disease: Secondary | ICD-10-CM | POA: Diagnosis not present

## 2023-02-06 DIAGNOSIS — D689 Coagulation defect, unspecified: Secondary | ICD-10-CM | POA: Diagnosis not present

## 2023-02-06 DIAGNOSIS — N2581 Secondary hyperparathyroidism of renal origin: Secondary | ICD-10-CM | POA: Diagnosis not present

## 2023-02-08 DIAGNOSIS — D631 Anemia in chronic kidney disease: Secondary | ICD-10-CM | POA: Diagnosis not present

## 2023-02-08 DIAGNOSIS — N186 End stage renal disease: Secondary | ICD-10-CM | POA: Diagnosis not present

## 2023-02-08 DIAGNOSIS — Z4901 Encounter for fitting and adjustment of extracorporeal dialysis catheter: Secondary | ICD-10-CM | POA: Diagnosis not present

## 2023-02-08 DIAGNOSIS — E876 Hypokalemia: Secondary | ICD-10-CM | POA: Diagnosis not present

## 2023-02-08 DIAGNOSIS — D689 Coagulation defect, unspecified: Secondary | ICD-10-CM | POA: Diagnosis not present

## 2023-02-08 DIAGNOSIS — N2581 Secondary hyperparathyroidism of renal origin: Secondary | ICD-10-CM | POA: Diagnosis not present

## 2023-02-08 DIAGNOSIS — Z23 Encounter for immunization: Secondary | ICD-10-CM | POA: Diagnosis not present

## 2023-02-08 DIAGNOSIS — Z992 Dependence on renal dialysis: Secondary | ICD-10-CM | POA: Diagnosis not present

## 2023-02-08 DIAGNOSIS — I120 Hypertensive chronic kidney disease with stage 5 chronic kidney disease or end stage renal disease: Secondary | ICD-10-CM | POA: Diagnosis not present

## 2023-02-09 ENCOUNTER — Inpatient Hospital Stay: Payer: Medicare Other | Admitting: Hematology and Oncology

## 2023-02-09 ENCOUNTER — Inpatient Hospital Stay: Payer: Medicare Other | Attending: Hematology and Oncology

## 2023-02-09 VITALS — BP 155/63 | HR 69 | Temp 97.9°F | Resp 16 | Ht 62.0 in | Wt 139.7 lb

## 2023-02-09 DIAGNOSIS — Z992 Dependence on renal dialysis: Secondary | ICD-10-CM | POA: Insufficient documentation

## 2023-02-09 DIAGNOSIS — N186 End stage renal disease: Secondary | ICD-10-CM | POA: Diagnosis not present

## 2023-02-09 DIAGNOSIS — Z170 Estrogen receptor positive status [ER+]: Secondary | ICD-10-CM | POA: Diagnosis not present

## 2023-02-09 DIAGNOSIS — C50412 Malignant neoplasm of upper-outer quadrant of left female breast: Secondary | ICD-10-CM | POA: Insufficient documentation

## 2023-02-09 DIAGNOSIS — D649 Anemia, unspecified: Secondary | ICD-10-CM | POA: Diagnosis not present

## 2023-02-09 DIAGNOSIS — Z79811 Long term (current) use of aromatase inhibitors: Secondary | ICD-10-CM | POA: Insufficient documentation

## 2023-02-09 LAB — CMP (CANCER CENTER ONLY)
ALT: 8 U/L (ref 0–44)
AST: 12 U/L — ABNORMAL LOW (ref 15–41)
Albumin: 3.9 g/dL (ref 3.5–5.0)
Alkaline Phosphatase: 85 U/L (ref 38–126)
Anion gap: 9 (ref 5–15)
BUN: 25 mg/dL — ABNORMAL HIGH (ref 8–23)
CO2: 31 mmol/L (ref 22–32)
Calcium: 9.5 mg/dL (ref 8.9–10.3)
Chloride: 102 mmol/L (ref 98–111)
Creatinine: 3.27 mg/dL — ABNORMAL HIGH (ref 0.44–1.00)
GFR, Estimated: 14 mL/min — ABNORMAL LOW (ref >60)
Glucose, Bld: 101 mg/dL — ABNORMAL HIGH (ref 70–99)
Potassium: 3.4 mmol/L — ABNORMAL LOW (ref 3.5–5.1)
Sodium: 142 mmol/L (ref 135–145)
Total Bilirubin: 0.5 mg/dL (ref 0.3–1.2)
Total Protein: 6.5 g/dL (ref 6.5–8.1)

## 2023-02-09 LAB — CBC WITH DIFFERENTIAL (CANCER CENTER ONLY)
Abs Immature Granulocytes: 0.14 10*3/uL — ABNORMAL HIGH (ref 0.00–0.07)
Basophils Absolute: 0 10*3/uL (ref 0.0–0.1)
Basophils Relative: 0 %
Eosinophils Absolute: 0.3 10*3/uL (ref 0.0–0.5)
Eosinophils Relative: 4 %
HCT: 31.6 % — ABNORMAL LOW (ref 36.0–46.0)
Hemoglobin: 10.6 g/dL — ABNORMAL LOW (ref 12.0–15.0)
Immature Granulocytes: 2 %
Lymphocytes Relative: 15 %
Lymphs Abs: 1.2 10*3/uL (ref 0.7–4.0)
MCH: 31.7 pg (ref 26.0–34.0)
MCHC: 33.5 g/dL (ref 30.0–36.0)
MCV: 94.6 fL (ref 80.0–100.0)
Monocytes Absolute: 0.8 10*3/uL (ref 0.1–1.0)
Monocytes Relative: 10 %
Neutro Abs: 5.6 10*3/uL (ref 1.7–7.7)
Neutrophils Relative %: 69 %
Platelet Count: 223 10*3/uL (ref 150–400)
RBC: 3.34 MIL/uL — ABNORMAL LOW (ref 3.87–5.11)
RDW: 14.1 % (ref 11.5–15.5)
WBC Count: 8.2 10*3/uL (ref 4.0–10.5)
nRBC: 0 % (ref 0.0–0.2)

## 2023-02-09 NOTE — Progress Notes (Signed)
Patient Care Team: Georgann Housekeeper, MD as PCP - General (Internal Medicine) Jerene Bears, MD as Consulting Physician (Gynecology) Pershing Proud, RN as Oncology Nurse Navigator Donnelly Angelica, RN as Oncology Nurse Navigator Serena Croissant, MD as Consulting Physician (Hematology and Oncology) Tyler Pita, MD as Attending Physician (Nephrology) Center, [facility]  DIAGNOSIS:  Encounter Diagnosis  Name Primary?   Malignant neoplasm of upper-outer quadrant of left breast in female, estrogen receptor positive (HCC) Yes    SUMMARY OF ONCOLOGIC HISTORY: Oncology History  Malignant neoplasm of upper-outer quadrant of left breast in female, estrogen receptor positive (HCC)  08/02/2021 Initial Diagnosis   Screening mammogram detected left breast distortion measuring 1.1 cm by ultrasound, negative axillary lymph nodes.  Biopsy revealed grade 2 ILC ER 100%, PR 95%, Ki-67 5%, HER2 IHC 2+, negative ratio 1.29   08/29/2021 Cancer Staging   Staging form: Breast, AJCC 8th Edition - Clinical stage from 08/29/2021: Stage IA (cT1c, cN0, cM0, G2, ER+, PR+, HER2-) - Signed by Serena Croissant, MD on 08/29/2021 Stage prefix: Initial diagnosis Histologic grading system: 3 grade system     CHIEF COMPLIANT:   History of Present Illness   The patient, with a history of breast cancer and kidney disease, presents for a follow-up visit. She reports that the anastrozole, which was started after a switch from letrozole, has been causing hot flashes. However, these are less severe than before and do not prevent her from returning to sleep after waking. She denies any rash, which was a problem with the previous medication.  Since her last visit, the patient has started dialysis, which she undergoes three times a week. She reports that her fistula has not been very successful, and she has been using a port for the dialysis. She expresses a desire to switch to using the fistula, as the port  prevents her from showering.  The patient also reports that her breathing has improved since starting dialysis, and she believes this is due to a reduction in fluid build-up in her lungs. She has also lost weight, which she attributes to fluid loss, and her appetite has increased. She has not been put on a restricted diet, as her phosphate and potassium levels are low.      ALLERGIES:  is allergic to ace inhibitors, lactose intolerance (gi), letrozole, lisinopril, mercury, and tape.  MEDICATIONS:  Current Outpatient Medications  Medication Sig Dispense Refill   acetaminophen (TYLENOL) 500 MG tablet Take 500 mg by mouth every 6 (six) hours as needed for moderate pain.     amLODipine (NORVASC) 2.5 MG tablet Take 0.5 mg by mouth daily. evening     anastrozole (ARIMIDEX) 1 MG tablet Take 1 tablet (1 mg total) by mouth daily. 90 tablet 3   atorvastatin (LIPITOR) 10 MG tablet Take 10 mg by mouth daily.     budesonide (RHINOCORT AQUA) 32 MCG/ACT nasal spray Place 2 sprays into both nostrils daily.     Cholecalciferol 50 MCG (2000 UT) CAPS 1 tablet Orally once a day     diclofenac Sodium (VOLTAREN) 1 % GEL Apply 1 Application topically 3 (three) times daily as needed (pain).     febuxostat (ULORIC) 40 MG tablet Take 40 mg by mouth daily.     hydrocortisone cream 1 % Apply 1 Application topically 2 (two) times daily as needed for itching.     Lidocaine 0.5 % AERO as directed Externally as needed     loratadine (CLARITIN) 10 MG tablet Take  10 mg by mouth 2 (two) times daily.     losartan (COZAAR) 100 MG tablet Take 100 mg by mouth daily.     melatonin 5 MG TABS Take 1 tablet (5 mg total) by mouth at bedtime as needed.  0   metoprolol succinate (TOPROL-XL) 50 MG 24 hr tablet Take 50 mg by mouth daily. At bedtime     Multiple Vitamins-Minerals (PRESERVISION AREDS 2+MULTI VIT PO) Take 1 capsule by mouth daily.     pantoprazole (PROTONIX) 40 MG tablet Take 40 mg by mouth daily.     PROAIR HFA 108 (90  BASE) MCG/ACT inhaler Inhale 2 puffs into the lungs every 6 (six) hours as needed (for respiratory issues.).      No current facility-administered medications for this visit.    PHYSICAL EXAMINATION: ECOG PERFORMANCE STATUS: 1 - Symptomatic but completely ambulatory  Vitals:   02/09/23 1122  BP: (!) 155/63  Pulse: 69  Resp: 16  Temp: 97.9 F (36.6 C)  SpO2: 100%   Filed Weights   02/09/23 1122  Weight: 139 lb 11.2 oz (63.4 kg)      LABORATORY DATA:  I have reviewed the data as listed    Latest Ref Rng & Units 02/09/2023   10:21 AM 10/16/2022    1:40 PM 09/07/2022   11:05 AM  CMP  Glucose 70 - 99 mg/dL 161  096  99   BUN 8 - 23 mg/dL 25  73  62   Creatinine 0.44 - 1.00 mg/dL 0.45  4.09  8.11   Sodium 135 - 145 mmol/L 142  140  144   Potassium 3.5 - 5.1 mmol/L 3.4  4.8  4.3   Chloride 98 - 111 mmol/L 102  110  117   CO2 22 - 32 mmol/L 31  18  16    Calcium 8.9 - 10.3 mg/dL 9.5  9.3  8.2   Total Protein 6.5 - 8.1 g/dL 6.5  6.5  6.7   Total Bilirubin 0.3 - 1.2 mg/dL 0.5  0.4  0.5   Alkaline Phos 38 - 126 U/L 85  67  66   AST 15 - 41 U/L 12  12  13    ALT 0 - 44 U/L 8  7  11      Lab Results  Component Value Date   WBC 8.2 02/09/2023   HGB 10.6 (L) 02/09/2023   HCT 31.6 (L) 02/09/2023   MCV 94.6 02/09/2023   PLT 223 02/09/2023   NEUTROABS 5.6 02/09/2023    ASSESSMENT & PLAN:  Malignant neoplasm of upper-outer quadrant of left breast in female, estrogen receptor positive (HCC) Left Lumpectomy: 1.1 cm Grade 1 ILC , Positive Posterior margin, ER 100%, PR 95%, Ki-67 5%, HER2 IHC 2+, negative ratio 1.29 Margin excision 09/29/2021: Benign  Left lower quadrant abdominal pain: CT abdomen 02/07/2022: Right lower quadrant mesenteric mass, 5 mm right lung base nodule   PET CT scan 02/10/2022: 2.7 cm mesenteric mass is hypermetabolic consistent with neoplastic process.  Multiple hypermetabolic bone lesions involving the spine most notable at L4, focus of hypermetabolism or vaginal  cuff, right atrial appendage -------------------------------------------------------------------- Treatment plan:  Verzinio (started 04/11/2022 stopped May 2024) to letrozole   07/10/2022: CT CAP: Interval decrease in the size of the soft tissue nodule in the right side of mid pelvic mesentery 2 cm (previously 2.7 cm), skin thickening left chest, patchy groundglass parenchymal opacities in the lungs right-sided renal atrophy   Recommendation: Stopped Verzinio 08/23/2022 Switch letrozole to anastrozole (  because of the maculopapular rash)   Anemia: Secondary to Verzinio.  Improving.     CT CAP 10/11/2022: No new metastatic breast cancer identified.  New bilateral small pleural effusions, 1.4 cm wedge-shaped opacity right lower lobe needs follow-up.   CT CAP 11/23/2022: No new or progressive metastatic disease.  Wedge-shaped opacity significantly decreased.  Stable 1.8 cm mesenteric lymph node.      Breast Cancer Stable on Anastrozole with manageable hot flashes and no rash. Recent scans in August showed no new or worsening findings. -Continue Anastrozole. -Plan for next scan in February 2025.  End Stage Renal Disease Recently started on dialysis in September 2024. Currently dialyzing via port due to unsuccessful fistula use. Noted improvement in breathing and overall well-being since starting dialysis. -Continue dialysis three times a week. -Consider future fistula use for long-term dialysis.  General Health Maintenance -Continue monitoring labs related to dialysis, including phosphate and potassium levels. -Follow-up appointment in February 2025, one week after scheduled scan.        Orders Placed This Encounter  Procedures   CT CHEST ABDOMEN PELVIS WO CONTRAST    Standing Status:   Future    Standing Expiration Date:   02/09/2024    Order Specific Question:   Preferred imaging location?    Answer:   [facility]    Order Specific Question:   If indicated for the ordered  procedure, I authorize the administration of oral contrast media per Radiology protocol    Answer:   Yes    Order Specific Question:   Does the patient have a contrast media/X-ray dye allergy?    Answer:   No    Order Specific Question:   Release to patient    Answer:   Immediate   The patient has a good understanding of the overall plan. she agrees with it. she will call with any problems that may develop before the next visit here. Total time spent: 30 mins including face to face time and time spent for planning, charting and co-ordination of care   Tamsen Meek, MD 02/09/23

## 2023-02-09 NOTE — Assessment & Plan Note (Signed)
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  Multiple hypermetabolic bone lesions involving the spine most notable at L4, focus of hypermetabolism or vaginal cuff, right atrial appendage -------------------------------------------------------------------- Treatment plan:  Verzinio (started 04/11/2022 stopped May 2024) to letrozole   07/10/2022: CT CAP: Interval decrease in the size of the soft tissue nodule in the right side of mid pelvic mesentery 2 cm (previously 2.7 cm), skin thickening left chest, patchy groundglass parenchymal opacities in the lungs right-sided renal atrophy   Recommendation: Stopped Verzinio 08/23/2022 Switch letrozole to anastrozole (because of the maculopapular rash)   Anemia: Secondary to Verzinio.  Improving.     CT CAP 10/11/2022: No new metastatic breast cancer identified.  New bilateral small pleural effusions, 1.4 cm wedge-shaped opacity right lower lobe needs follow-up.   CT CAP 11/23/2022: No new or progressive metastatic disease.  Wedge-shaped opacity significantly decreased.  Stable 1.8 cm mesenteric lymph node.   Labs and follow-up and scans in 2 months.

## 2023-02-10 DIAGNOSIS — Z4901 Encounter for fitting and adjustment of extracorporeal dialysis catheter: Secondary | ICD-10-CM | POA: Diagnosis not present

## 2023-02-10 DIAGNOSIS — D631 Anemia in chronic kidney disease: Secondary | ICD-10-CM | POA: Diagnosis not present

## 2023-02-10 DIAGNOSIS — Z992 Dependence on renal dialysis: Secondary | ICD-10-CM | POA: Diagnosis not present

## 2023-02-10 DIAGNOSIS — D509 Iron deficiency anemia, unspecified: Secondary | ICD-10-CM | POA: Diagnosis not present

## 2023-02-10 DIAGNOSIS — D689 Coagulation defect, unspecified: Secondary | ICD-10-CM | POA: Diagnosis not present

## 2023-02-10 DIAGNOSIS — Z23 Encounter for immunization: Secondary | ICD-10-CM | POA: Diagnosis not present

## 2023-02-10 DIAGNOSIS — N186 End stage renal disease: Secondary | ICD-10-CM | POA: Diagnosis not present

## 2023-02-10 DIAGNOSIS — N2581 Secondary hyperparathyroidism of renal origin: Secondary | ICD-10-CM | POA: Diagnosis not present

## 2023-02-10 LAB — CANCER ANTIGEN 27.29: CA 27.29: 45.9 U/mL — ABNORMAL HIGH (ref 0.0–38.6)

## 2023-02-11 LAB — CANCER ANTIGEN 15-3: CA 15-3: 34.7 U/mL — ABNORMAL HIGH (ref 0.0–25.0)

## 2023-02-13 DIAGNOSIS — N186 End stage renal disease: Secondary | ICD-10-CM | POA: Diagnosis not present

## 2023-02-13 DIAGNOSIS — Z4901 Encounter for fitting and adjustment of extracorporeal dialysis catheter: Secondary | ICD-10-CM | POA: Diagnosis not present

## 2023-02-13 DIAGNOSIS — D509 Iron deficiency anemia, unspecified: Secondary | ICD-10-CM | POA: Diagnosis not present

## 2023-02-13 DIAGNOSIS — N2581 Secondary hyperparathyroidism of renal origin: Secondary | ICD-10-CM | POA: Diagnosis not present

## 2023-02-13 DIAGNOSIS — Z23 Encounter for immunization: Secondary | ICD-10-CM | POA: Diagnosis not present

## 2023-02-13 DIAGNOSIS — Z992 Dependence on renal dialysis: Secondary | ICD-10-CM | POA: Diagnosis not present

## 2023-02-13 DIAGNOSIS — D631 Anemia in chronic kidney disease: Secondary | ICD-10-CM | POA: Diagnosis not present

## 2023-02-13 DIAGNOSIS — D689 Coagulation defect, unspecified: Secondary | ICD-10-CM | POA: Diagnosis not present

## 2023-02-15 DIAGNOSIS — N186 End stage renal disease: Secondary | ICD-10-CM | POA: Diagnosis not present

## 2023-02-15 DIAGNOSIS — Z992 Dependence on renal dialysis: Secondary | ICD-10-CM | POA: Diagnosis not present

## 2023-02-15 DIAGNOSIS — N2581 Secondary hyperparathyroidism of renal origin: Secondary | ICD-10-CM | POA: Diagnosis not present

## 2023-02-15 DIAGNOSIS — Z23 Encounter for immunization: Secondary | ICD-10-CM | POA: Diagnosis not present

## 2023-02-15 DIAGNOSIS — D509 Iron deficiency anemia, unspecified: Secondary | ICD-10-CM | POA: Diagnosis not present

## 2023-02-15 DIAGNOSIS — D631 Anemia in chronic kidney disease: Secondary | ICD-10-CM | POA: Diagnosis not present

## 2023-02-15 DIAGNOSIS — Z4901 Encounter for fitting and adjustment of extracorporeal dialysis catheter: Secondary | ICD-10-CM | POA: Diagnosis not present

## 2023-02-15 DIAGNOSIS — D689 Coagulation defect, unspecified: Secondary | ICD-10-CM | POA: Diagnosis not present

## 2023-02-17 DIAGNOSIS — D509 Iron deficiency anemia, unspecified: Secondary | ICD-10-CM | POA: Diagnosis not present

## 2023-02-17 DIAGNOSIS — D689 Coagulation defect, unspecified: Secondary | ICD-10-CM | POA: Diagnosis not present

## 2023-02-17 DIAGNOSIS — N186 End stage renal disease: Secondary | ICD-10-CM | POA: Diagnosis not present

## 2023-02-17 DIAGNOSIS — D631 Anemia in chronic kidney disease: Secondary | ICD-10-CM | POA: Diagnosis not present

## 2023-02-17 DIAGNOSIS — Z992 Dependence on renal dialysis: Secondary | ICD-10-CM | POA: Diagnosis not present

## 2023-02-17 DIAGNOSIS — N2581 Secondary hyperparathyroidism of renal origin: Secondary | ICD-10-CM | POA: Diagnosis not present

## 2023-02-17 DIAGNOSIS — Z4901 Encounter for fitting and adjustment of extracorporeal dialysis catheter: Secondary | ICD-10-CM | POA: Diagnosis not present

## 2023-02-17 DIAGNOSIS — Z23 Encounter for immunization: Secondary | ICD-10-CM | POA: Diagnosis not present

## 2023-02-20 DIAGNOSIS — Z992 Dependence on renal dialysis: Secondary | ICD-10-CM | POA: Diagnosis not present

## 2023-02-20 DIAGNOSIS — N2581 Secondary hyperparathyroidism of renal origin: Secondary | ICD-10-CM | POA: Diagnosis not present

## 2023-02-20 DIAGNOSIS — N186 End stage renal disease: Secondary | ICD-10-CM | POA: Diagnosis not present

## 2023-02-20 DIAGNOSIS — Z23 Encounter for immunization: Secondary | ICD-10-CM | POA: Diagnosis not present

## 2023-02-20 DIAGNOSIS — D631 Anemia in chronic kidney disease: Secondary | ICD-10-CM | POA: Diagnosis not present

## 2023-02-20 DIAGNOSIS — D689 Coagulation defect, unspecified: Secondary | ICD-10-CM | POA: Diagnosis not present

## 2023-02-20 DIAGNOSIS — Z4901 Encounter for fitting and adjustment of extracorporeal dialysis catheter: Secondary | ICD-10-CM | POA: Diagnosis not present

## 2023-02-20 DIAGNOSIS — D509 Iron deficiency anemia, unspecified: Secondary | ICD-10-CM | POA: Diagnosis not present

## 2023-02-22 DIAGNOSIS — D631 Anemia in chronic kidney disease: Secondary | ICD-10-CM | POA: Diagnosis not present

## 2023-02-22 DIAGNOSIS — N2581 Secondary hyperparathyroidism of renal origin: Secondary | ICD-10-CM | POA: Diagnosis not present

## 2023-02-22 DIAGNOSIS — D689 Coagulation defect, unspecified: Secondary | ICD-10-CM | POA: Diagnosis not present

## 2023-02-22 DIAGNOSIS — Z23 Encounter for immunization: Secondary | ICD-10-CM | POA: Diagnosis not present

## 2023-02-22 DIAGNOSIS — Z992 Dependence on renal dialysis: Secondary | ICD-10-CM | POA: Diagnosis not present

## 2023-02-22 DIAGNOSIS — N186 End stage renal disease: Secondary | ICD-10-CM | POA: Diagnosis not present

## 2023-02-22 DIAGNOSIS — D509 Iron deficiency anemia, unspecified: Secondary | ICD-10-CM | POA: Diagnosis not present

## 2023-02-22 DIAGNOSIS — Z4901 Encounter for fitting and adjustment of extracorporeal dialysis catheter: Secondary | ICD-10-CM | POA: Diagnosis not present

## 2023-02-24 DIAGNOSIS — N2581 Secondary hyperparathyroidism of renal origin: Secondary | ICD-10-CM | POA: Diagnosis not present

## 2023-02-24 DIAGNOSIS — Z992 Dependence on renal dialysis: Secondary | ICD-10-CM | POA: Diagnosis not present

## 2023-02-24 DIAGNOSIS — N186 End stage renal disease: Secondary | ICD-10-CM | POA: Diagnosis not present

## 2023-02-24 DIAGNOSIS — Z4901 Encounter for fitting and adjustment of extracorporeal dialysis catheter: Secondary | ICD-10-CM | POA: Diagnosis not present

## 2023-02-24 DIAGNOSIS — D509 Iron deficiency anemia, unspecified: Secondary | ICD-10-CM | POA: Diagnosis not present

## 2023-02-24 DIAGNOSIS — D689 Coagulation defect, unspecified: Secondary | ICD-10-CM | POA: Diagnosis not present

## 2023-02-24 DIAGNOSIS — D631 Anemia in chronic kidney disease: Secondary | ICD-10-CM | POA: Diagnosis not present

## 2023-02-24 DIAGNOSIS — Z23 Encounter for immunization: Secondary | ICD-10-CM | POA: Diagnosis not present

## 2023-02-27 DIAGNOSIS — D509 Iron deficiency anemia, unspecified: Secondary | ICD-10-CM | POA: Diagnosis not present

## 2023-02-27 DIAGNOSIS — D689 Coagulation defect, unspecified: Secondary | ICD-10-CM | POA: Diagnosis not present

## 2023-02-27 DIAGNOSIS — Z992 Dependence on renal dialysis: Secondary | ICD-10-CM | POA: Diagnosis not present

## 2023-02-27 DIAGNOSIS — Z4901 Encounter for fitting and adjustment of extracorporeal dialysis catheter: Secondary | ICD-10-CM | POA: Diagnosis not present

## 2023-02-27 DIAGNOSIS — N2581 Secondary hyperparathyroidism of renal origin: Secondary | ICD-10-CM | POA: Diagnosis not present

## 2023-02-27 DIAGNOSIS — D631 Anemia in chronic kidney disease: Secondary | ICD-10-CM | POA: Diagnosis not present

## 2023-02-27 DIAGNOSIS — N186 End stage renal disease: Secondary | ICD-10-CM | POA: Diagnosis not present

## 2023-02-27 DIAGNOSIS — Z23 Encounter for immunization: Secondary | ICD-10-CM | POA: Diagnosis not present

## 2023-03-01 DIAGNOSIS — D689 Coagulation defect, unspecified: Secondary | ICD-10-CM | POA: Diagnosis not present

## 2023-03-01 DIAGNOSIS — Z4901 Encounter for fitting and adjustment of extracorporeal dialysis catheter: Secondary | ICD-10-CM | POA: Diagnosis not present

## 2023-03-01 DIAGNOSIS — N186 End stage renal disease: Secondary | ICD-10-CM | POA: Diagnosis not present

## 2023-03-01 DIAGNOSIS — N2581 Secondary hyperparathyroidism of renal origin: Secondary | ICD-10-CM | POA: Diagnosis not present

## 2023-03-01 DIAGNOSIS — Z23 Encounter for immunization: Secondary | ICD-10-CM | POA: Diagnosis not present

## 2023-03-01 DIAGNOSIS — D509 Iron deficiency anemia, unspecified: Secondary | ICD-10-CM | POA: Diagnosis not present

## 2023-03-01 DIAGNOSIS — D631 Anemia in chronic kidney disease: Secondary | ICD-10-CM | POA: Diagnosis not present

## 2023-03-01 DIAGNOSIS — Z992 Dependence on renal dialysis: Secondary | ICD-10-CM | POA: Diagnosis not present

## 2023-03-03 DIAGNOSIS — N186 End stage renal disease: Secondary | ICD-10-CM | POA: Diagnosis not present

## 2023-03-03 DIAGNOSIS — Z992 Dependence on renal dialysis: Secondary | ICD-10-CM | POA: Diagnosis not present

## 2023-03-03 DIAGNOSIS — D631 Anemia in chronic kidney disease: Secondary | ICD-10-CM | POA: Diagnosis not present

## 2023-03-03 DIAGNOSIS — Z23 Encounter for immunization: Secondary | ICD-10-CM | POA: Diagnosis not present

## 2023-03-03 DIAGNOSIS — Z4901 Encounter for fitting and adjustment of extracorporeal dialysis catheter: Secondary | ICD-10-CM | POA: Diagnosis not present

## 2023-03-03 DIAGNOSIS — D509 Iron deficiency anemia, unspecified: Secondary | ICD-10-CM | POA: Diagnosis not present

## 2023-03-03 DIAGNOSIS — D689 Coagulation defect, unspecified: Secondary | ICD-10-CM | POA: Diagnosis not present

## 2023-03-03 DIAGNOSIS — N2581 Secondary hyperparathyroidism of renal origin: Secondary | ICD-10-CM | POA: Diagnosis not present

## 2023-03-05 DIAGNOSIS — Z4901 Encounter for fitting and adjustment of extracorporeal dialysis catheter: Secondary | ICD-10-CM | POA: Diagnosis not present

## 2023-03-05 DIAGNOSIS — Z23 Encounter for immunization: Secondary | ICD-10-CM | POA: Diagnosis not present

## 2023-03-05 DIAGNOSIS — N186 End stage renal disease: Secondary | ICD-10-CM | POA: Diagnosis not present

## 2023-03-05 DIAGNOSIS — D509 Iron deficiency anemia, unspecified: Secondary | ICD-10-CM | POA: Diagnosis not present

## 2023-03-05 DIAGNOSIS — D631 Anemia in chronic kidney disease: Secondary | ICD-10-CM | POA: Diagnosis not present

## 2023-03-05 DIAGNOSIS — Z992 Dependence on renal dialysis: Secondary | ICD-10-CM | POA: Diagnosis not present

## 2023-03-05 DIAGNOSIS — N2581 Secondary hyperparathyroidism of renal origin: Secondary | ICD-10-CM | POA: Diagnosis not present

## 2023-03-05 DIAGNOSIS — D689 Coagulation defect, unspecified: Secondary | ICD-10-CM | POA: Diagnosis not present

## 2023-03-06 ENCOUNTER — Ambulatory Visit (HOSPITAL_COMMUNITY)
Admission: RE | Admit: 2023-03-06 | Discharge: 2023-03-06 | Disposition: A | Discharge status: Home / Self Care | Payer: Medicare Other | Source: Ambulatory Visit | Attending: Vascular Surgery | Admitting: Vascular Surgery

## 2023-03-06 ENCOUNTER — Other Ambulatory Visit (HOSPITAL_COMMUNITY): Payer: Self-pay | Admitting: Nephrology

## 2023-03-06 DIAGNOSIS — Z992 Dependence on renal dialysis: Secondary | ICD-10-CM | POA: Insufficient documentation

## 2023-03-07 ENCOUNTER — Ambulatory Visit: Payer: Medicare Other

## 2023-03-07 VITALS — BP 154/80 | HR 73 | Temp 98.0°F | Resp 18 | Ht 62.0 in | Wt 142.7 lb

## 2023-03-07 DIAGNOSIS — Z23 Encounter for immunization: Secondary | ICD-10-CM | POA: Diagnosis not present

## 2023-03-07 DIAGNOSIS — N186 End stage renal disease: Secondary | ICD-10-CM | POA: Diagnosis not present

## 2023-03-07 DIAGNOSIS — Z992 Dependence on renal dialysis: Secondary | ICD-10-CM | POA: Diagnosis not present

## 2023-03-07 DIAGNOSIS — D689 Coagulation defect, unspecified: Secondary | ICD-10-CM | POA: Diagnosis not present

## 2023-03-07 DIAGNOSIS — N2581 Secondary hyperparathyroidism of renal origin: Secondary | ICD-10-CM | POA: Diagnosis not present

## 2023-03-07 DIAGNOSIS — D509 Iron deficiency anemia, unspecified: Secondary | ICD-10-CM | POA: Diagnosis not present

## 2023-03-07 DIAGNOSIS — Z4901 Encounter for fitting and adjustment of extracorporeal dialysis catheter: Secondary | ICD-10-CM | POA: Diagnosis not present

## 2023-03-07 DIAGNOSIS — D631 Anemia in chronic kidney disease: Secondary | ICD-10-CM | POA: Diagnosis not present

## 2023-03-07 NOTE — Progress Notes (Signed)
Office Note   History of Present Illness   Rachel Villarreal is a 77 y.o. (1945/09/17) female who presents for evaluation of dialysis access.  She has a history of right brachiocephalic fistula creation on 04/24/2022 by Dr. Edilia Bo.  She was cleared to use this fistula in October 2024.  She returns today for repeat evaluation.  She states ever since being cleared to use her fistula, dialysis has only been able to use 1 needle.  Typically the dialysis center will stick her fistula with one needle and her [facility] with another.  Every time they have attempted to use 2 needles on the fistula, it will infiltrate.  Her arm will significantly bruise and swell.  She denies any symptoms of steal.  Current Outpatient Medications  Medication Sig Dispense Refill   acetaminophen (TYLENOL) 500 MG tablet Take 500 mg by mouth every 6 (six) hours as needed for moderate pain.     amLODipine (NORVASC) 2.5 MG tablet Take 2 tablets by mouth daily. evening     anastrozole (ARIMIDEX) 1 MG tablet Take 1 tablet (1 mg total) by mouth daily. 90 tablet 3   atorvastatin (LIPITOR) 10 MG tablet Take 10 mg by mouth daily.     budesonide (RHINOCORT AQUA) 32 MCG/ACT nasal spray Place 2 sprays into both nostrils daily.     Cholecalciferol 50 MCG (2000 UT) CAPS 1 tablet Orally once a day     diclofenac Sodium (VOLTAREN) 1 % GEL Apply 1 Application topically 3 (three) times daily as needed (pain).     febuxostat (ULORIC) 40 MG tablet Take 40 mg by mouth daily.     hydrocortisone cream 1 % Apply 1 Application topically 2 (two) times daily as needed for itching.     Lidocaine 0.5 % AERO as directed Externally as needed     loratadine (CLARITIN) 10 MG tablet Take 10 mg by mouth 2 (two) times daily.     losartan (COZAAR) 100 MG tablet Take 100 mg by mouth daily.     melatonin 5 MG TABS Take 1 tablet (5 mg total) by mouth at bedtime as needed.  0   metoprolol succinate (TOPROL-XL) 50 MG 24 hr tablet Take 50 mg by mouth daily. At  bedtime     Multiple Vitamins-Minerals (PRESERVISION AREDS 2+MULTI VIT PO) Take 1 capsule by mouth daily.     pantoprazole (PROTONIX) 40 MG tablet Take 40 mg by mouth daily.     PROAIR HFA 108 (90 BASE) MCG/ACT inhaler Inhale 2 puffs into the lungs every 6 (six) hours as needed (for respiratory issues.).      No current facility-administered medications for this visit.    REVIEW OF SYSTEMS (negative unless checked):   Cardiac:  []  Chest pain or chest pressure? []  Shortness of breath upon activity? []  Shortness of breath when lying flat? []  Irregular heart rhythm?  Vascular:  []  Pain in calf, thigh, or hip brought on by walking? []  Pain in feet at night that wakes you up from your sleep? []  Blood clot in your veins? []  Leg swelling?  Pulmonary:  []  Oxygen at home? []  Productive cough? []  Wheezing?  Neurologic:  []  Sudden weakness in arms or legs? []  Sudden numbness in arms or legs? []  Sudden onset of difficult speaking or slurred speech? []  Temporary loss of vision in one eye? []  Problems with dizziness?  Gastrointestinal:  []  Blood in stool? []  Vomited blood?  Genitourinary:  []  Burning when urinating? []  Blood in urine?  Psychiatric:  []  Major depression  Hematologic:  []  Bleeding problems? []  Problems with blood clotting?  Dermatologic:  []  Rashes or ulcers?  Constitutional:  []  Fever or chills?  Ear/Nose/Throat:  []  Change in hearing? []  Nose bleeds? []  Sore throat?  Musculoskeletal:  []  Back pain? []  Joint pain? []  Muscle pain?   Physical Examination   Vitals:   03/07/23 0912  BP: (!) 154/80  Pulse: 73  Resp: 18  Temp: 98 F (36.7 C)  TempSrc: Temporal  SpO2: 96%  Weight: 142 lb 11.2 oz (64.7 kg)  Height: 5\' 2"  (1.575 m)   Body mass index is 26.1 kg/m.  General:  WDWN in NAD; vital signs documented above Gait: Not observed HENT: WNL, normocephalic Pulmonary: normal non-labored breathing , without rales, rhonchi,   wheezing Cardiac: regular Abdomen: soft, NT, no masses Skin: without rashes Vascular Exam/Pulses: palpable right radial pulse Extremities: right upper arm fistula with strong thrill on palpation, slightly pulsatile  Musculoskeletal: no muscle wasting or atrophy  Neurologic: A&O X 3;  No focal weakness or paresthesias are detected Psychiatric:  The pt has Normal affect.   Non-invasive Vascular Imaging   Right Arm Access Duplex  (03/06/2023):  +--------------------+----------+-----------------+--------+  AVF                PSV (cm/s)Flow Vol (mL/min)Comments  +--------------------+----------+-----------------+--------+  Native artery inflow   316          1275                 +--------------------+----------+-----------------+--------+  AVF Anastomosis        724                               +--------------------+----------+-----------------+--------+     +------------+----------+-------------+----------+--------+  OUTFLOW VEINPSV (cm/s)Diameter (cm)Depth (cm)Describe  +------------+----------+-------------+----------+--------+  Prox UA        272        0.61        0.69             +------------+----------+-------------+----------+--------+  Mid UA         474        0.66        0.54             +------------+----------+-------------+----------+--------+  Dist UA        151        0.70        0.62             +------------+----------+-------------+----------+--------+  AC Fossa       404        0.78        0.34             +------------+----------+-------------+----------+--------+      Medical Decision Making   Rachel Villarreal is a 77 y.o. female who presents for fistula evaluation  The patient has a history of right brachiocephalic AV fistula creation in January of this year.  She was cleared to use this fistula last month. Dialysis has been unable to stick her fistula with 2 needles without repeat infiltration events.   Currently they can stick the fistula with one needle  Duplex demonstrates a patent fistula with great flow of 1275 ml/min.  The fistula is fairly superficial in the arm with diameters greater than 6 mm.  There is some stenosis at the anastomosis and mid upper arm.  Potentially this is causing issues  with infiltration On exam the fistula has a strong thrill.  It is slightly pulsatile throughout the upper arm I have explained to the patient she would benefit from fistulogram with possible intervention.  We can schedule this within the next couple of weeks with Dr. Randie Heinz.  This can be done on a nondialysis day.  In the interim, dialysis can use her catheter only   Loel Dubonnet PA-C [facility] Office: [phone]  Clinic MD: Randie Heinz

## 2023-03-07 NOTE — H&P (View-Only) (Signed)
 Office Note   History of Present Illness   Rachel Villarreal is a 77 y.o. (1945/09/17) female who presents for evaluation of dialysis access.  She has a history of right brachiocephalic fistula creation on 04/24/2022 by Dr. Edilia Bo.  She was cleared to use this fistula in October 2024.  She returns today for repeat evaluation.  She states ever since being cleared to use her fistula, dialysis has only been able to use 1 needle.  Typically the dialysis center will stick her fistula with one needle and her [facility] with another.  Every time they have attempted to use 2 needles on the fistula, it will infiltrate.  Her arm will significantly bruise and swell.  She denies any symptoms of steal.  Current Outpatient Medications  Medication Sig Dispense Refill   acetaminophen (TYLENOL) 500 MG tablet Take 500 mg by mouth every 6 (six) hours as needed for moderate pain.     amLODipine (NORVASC) 2.5 MG tablet Take 2 tablets by mouth daily. evening     anastrozole (ARIMIDEX) 1 MG tablet Take 1 tablet (1 mg total) by mouth daily. 90 tablet 3   atorvastatin (LIPITOR) 10 MG tablet Take 10 mg by mouth daily.     budesonide (RHINOCORT AQUA) 32 MCG/ACT nasal spray Place 2 sprays into both nostrils daily.     Cholecalciferol 50 MCG (2000 UT) CAPS 1 tablet Orally once a day     diclofenac Sodium (VOLTAREN) 1 % GEL Apply 1 Application topically 3 (three) times daily as needed (pain).     febuxostat (ULORIC) 40 MG tablet Take 40 mg by mouth daily.     hydrocortisone cream 1 % Apply 1 Application topically 2 (two) times daily as needed for itching.     Lidocaine 0.5 % AERO as directed Externally as needed     loratadine (CLARITIN) 10 MG tablet Take 10 mg by mouth 2 (two) times daily.     losartan (COZAAR) 100 MG tablet Take 100 mg by mouth daily.     melatonin 5 MG TABS Take 1 tablet (5 mg total) by mouth at bedtime as needed.  0   metoprolol succinate (TOPROL-XL) 50 MG 24 hr tablet Take 50 mg by mouth daily. At  bedtime     Multiple Vitamins-Minerals (PRESERVISION AREDS 2+MULTI VIT PO) Take 1 capsule by mouth daily.     pantoprazole (PROTONIX) 40 MG tablet Take 40 mg by mouth daily.     PROAIR HFA 108 (90 BASE) MCG/ACT inhaler Inhale 2 puffs into the lungs every 6 (six) hours as needed (for respiratory issues.).      No current facility-administered medications for this visit.    REVIEW OF SYSTEMS (negative unless checked):   Cardiac:  []  Chest pain or chest pressure? []  Shortness of breath upon activity? []  Shortness of breath when lying flat? []  Irregular heart rhythm?  Vascular:  []  Pain in calf, thigh, or hip brought on by walking? []  Pain in feet at night that wakes you up from your sleep? []  Blood clot in your veins? []  Leg swelling?  Pulmonary:  []  Oxygen at home? []  Productive cough? []  Wheezing?  Neurologic:  []  Sudden weakness in arms or legs? []  Sudden numbness in arms or legs? []  Sudden onset of difficult speaking or slurred speech? []  Temporary loss of vision in one eye? []  Problems with dizziness?  Gastrointestinal:  []  Blood in stool? []  Vomited blood?  Genitourinary:  []  Burning when urinating? []  Blood in urine?  Psychiatric:  []  Major depression  Hematologic:  []  Bleeding problems? []  Problems with blood clotting?  Dermatologic:  []  Rashes or ulcers?  Constitutional:  []  Fever or chills?  Ear/Nose/Throat:  []  Change in hearing? []  Nose bleeds? []  Sore throat?  Musculoskeletal:  []  Back pain? []  Joint pain? []  Muscle pain?   Physical Examination   Vitals:   03/07/23 0912  BP: (!) 154/80  Pulse: 73  Resp: 18  Temp: 98 F (36.7 C)  TempSrc: Temporal  SpO2: 96%  Weight: 142 lb 11.2 oz (64.7 kg)  Height: 5\' 2"  (1.575 m)   Body mass index is 26.1 kg/m.  General:  WDWN in NAD; vital signs documented above Gait: Not observed HENT: WNL, normocephalic Pulmonary: normal non-labored breathing , without rales, rhonchi,   wheezing Cardiac: regular Abdomen: soft, NT, no masses Skin: without rashes Vascular Exam/Pulses: palpable right radial pulse Extremities: right upper arm fistula with strong thrill on palpation, slightly pulsatile  Musculoskeletal: no muscle wasting or atrophy  Neurologic: A&O X 3;  No focal weakness or paresthesias are detected Psychiatric:  The pt has Normal affect.   Non-invasive Vascular Imaging   Right Arm Access Duplex  (03/06/2023):  +--------------------+----------+-----------------+--------+  AVF                PSV (cm/s)Flow Vol (mL/min)Comments  +--------------------+----------+-----------------+--------+  Native artery inflow   316          1275                 +--------------------+----------+-----------------+--------+  AVF Anastomosis        724                               +--------------------+----------+-----------------+--------+     +------------+----------+-------------+----------+--------+  OUTFLOW VEINPSV (cm/s)Diameter (cm)Depth (cm)Describe  +------------+----------+-------------+----------+--------+  Prox UA        272        0.61        0.69             +------------+----------+-------------+----------+--------+  Mid UA         474        0.66        0.54             +------------+----------+-------------+----------+--------+  Dist UA        151        0.70        0.62             +------------+----------+-------------+----------+--------+  AC Fossa       404        0.78        0.34             +------------+----------+-------------+----------+--------+      Medical Decision Making   Rachel Villarreal is a 77 y.o. female who presents for fistula evaluation  The patient has a history of right brachiocephalic AV fistula creation in January of this year.  She was cleared to use this fistula last month. Dialysis has been unable to stick her fistula with 2 needles without repeat infiltration events.   Currently they can stick the fistula with one needle  Duplex demonstrates a patent fistula with great flow of 1275 ml/min.  The fistula is fairly superficial in the arm with diameters greater than 6 mm.  There is some stenosis at the anastomosis and mid upper arm.  Potentially this is causing issues  with infiltration On exam the fistula has a strong thrill.  It is slightly pulsatile throughout the upper arm I have explained to the patient she would benefit from fistulogram with possible intervention.  We can schedule this within the next couple of weeks with Dr. Randie Heinz.  This can be done on a nondialysis day.  In the interim, dialysis can use her catheter only   Loel Dubonnet PA-C [facility] Office: [phone]  Clinic MD: Randie Heinz

## 2023-03-09 ENCOUNTER — Other Ambulatory Visit: Payer: Self-pay

## 2023-03-09 ENCOUNTER — Observation Stay (HOSPITAL_COMMUNITY)
Admission: EM | Admit: 2023-03-09 | Discharge: 2023-03-10 | Disposition: A | Discharge status: Home / Self Care | Payer: Medicare Other | Attending: Internal Medicine | Admitting: Internal Medicine

## 2023-03-09 ENCOUNTER — Encounter (HOSPITAL_COMMUNITY): Payer: Self-pay

## 2023-03-09 DIAGNOSIS — E785 Hyperlipidemia, unspecified: Secondary | ICD-10-CM | POA: Diagnosis not present

## 2023-03-09 DIAGNOSIS — Z87891 Personal history of nicotine dependence: Secondary | ICD-10-CM | POA: Diagnosis not present

## 2023-03-09 DIAGNOSIS — G4700 Insomnia, unspecified: Secondary | ICD-10-CM | POA: Diagnosis not present

## 2023-03-09 DIAGNOSIS — D631 Anemia in chronic kidney disease: Secondary | ICD-10-CM | POA: Insufficient documentation

## 2023-03-09 DIAGNOSIS — K219 Gastro-esophageal reflux disease without esophagitis: Secondary | ICD-10-CM | POA: Diagnosis not present

## 2023-03-09 DIAGNOSIS — Z992 Dependence on renal dialysis: Secondary | ICD-10-CM | POA: Diagnosis present

## 2023-03-09 DIAGNOSIS — Z79899 Other long term (current) drug therapy: Secondary | ICD-10-CM | POA: Insufficient documentation

## 2023-03-09 DIAGNOSIS — I120 Hypertensive chronic kidney disease with stage 5 chronic kidney disease or end stage renal disease: Secondary | ICD-10-CM | POA: Diagnosis not present

## 2023-03-09 DIAGNOSIS — Z452 Encounter for adjustment and management of vascular access device: Secondary | ICD-10-CM | POA: Diagnosis not present

## 2023-03-09 DIAGNOSIS — Z853 Personal history of malignant neoplasm of breast: Secondary | ICD-10-CM | POA: Insufficient documentation

## 2023-03-09 DIAGNOSIS — J45909 Unspecified asthma, uncomplicated: Secondary | ICD-10-CM | POA: Diagnosis not present

## 2023-03-09 DIAGNOSIS — I770 Arteriovenous fistula, acquired: Secondary | ICD-10-CM | POA: Insufficient documentation

## 2023-03-09 DIAGNOSIS — E213 Hyperparathyroidism, unspecified: Secondary | ICD-10-CM | POA: Diagnosis not present

## 2023-03-09 DIAGNOSIS — N186 End stage renal disease: Principal | ICD-10-CM | POA: Insufficient documentation

## 2023-03-09 DIAGNOSIS — C50919 Malignant neoplasm of unspecified site of unspecified female breast: Secondary | ICD-10-CM | POA: Diagnosis present

## 2023-03-09 DIAGNOSIS — M109 Gout, unspecified: Secondary | ICD-10-CM | POA: Diagnosis not present

## 2023-03-09 DIAGNOSIS — I10 Essential (primary) hypertension: Secondary | ICD-10-CM | POA: Diagnosis present

## 2023-03-09 LAB — BASIC METABOLIC PANEL
Anion gap: 15 (ref 5–15)
BUN: 56 mg/dL — ABNORMAL HIGH (ref 8–23)
CO2: 24 mmol/L (ref 22–32)
Calcium: 9.2 mg/dL (ref 8.9–10.3)
Chloride: 103 mmol/L (ref 98–111)
Creatinine, Ser: 6.05 mg/dL — ABNORMAL HIGH (ref 0.44–1.00)
GFR, Estimated: 7 mL/min — ABNORMAL LOW (ref >60)
Glucose, Bld: 99 mg/dL (ref 70–99)
Potassium: 4.1 mmol/L (ref 3.5–5.1)
Sodium: 142 mmol/L (ref 135–145)

## 2023-03-09 LAB — CBC
HCT: 36 % (ref 36.0–46.0)
Hemoglobin: 11.6 g/dL — ABNORMAL LOW (ref 12.0–15.0)
MCH: 31.4 pg (ref 26.0–34.0)
MCHC: 32.2 g/dL (ref 30.0–36.0)
MCV: 97.6 fL (ref 80.0–100.0)
Platelets: 218 10*3/uL (ref 150–400)
RBC: 3.69 MIL/uL — ABNORMAL LOW (ref 3.87–5.11)
RDW: 14.6 % (ref 11.5–15.5)
WBC: 7.7 10*3/uL (ref 4.0–10.5)
nRBC: 0 % (ref 0.0–0.2)

## 2023-03-09 MED ORDER — MELATONIN 5 MG PO TABS
5.0000 mg | ORAL_TABLET | Freq: Every evening | ORAL | Status: DC | PRN
  Administered 2023-03-09: 5 mg via ORAL
  Filled 2023-03-09: qty 1

## 2023-03-09 MED ORDER — FEBUXOSTAT 40 MG PO TABS
40.0000 mg | ORAL_TABLET | Freq: Every day | ORAL | Status: DC
  Filled 2023-03-09: qty 1

## 2023-03-09 MED ORDER — HEPARIN SODIUM (PORCINE) 5000 UNIT/ML IJ SOLN: 5000.0000 [IU] | Freq: Three times a day (TID) | INTRAMUSCULAR | Status: DC

## 2023-03-09 MED ORDER — LOSARTAN POTASSIUM 50 MG PO TABS: 100.0000 mg | ORAL_TABLET | Freq: Every day | ORAL | Status: DC

## 2023-03-09 MED ORDER — CHLORHEXIDINE GLUCONATE CLOTH 2 % EX PADS
6.0000 | MEDICATED_PAD | Freq: Every day | CUTANEOUS | Status: DC
Start: 2023-03-10 — End: 2023-03-11

## 2023-03-09 MED ORDER — ANASTROZOLE 1 MG PO TABS
1.0000 mg | ORAL_TABLET | Freq: Every day | ORAL | Status: DC
  Filled 2023-03-09: qty 1

## 2023-03-09 MED ORDER — ALBUTEROL SULFATE (2.5 MG/3ML) 0.083% IN NEBU: 2.5000 mg | INHALATION_SOLUTION | Freq: Four times a day (QID) | RESPIRATORY_TRACT | Status: DC | PRN

## 2023-03-09 MED ORDER — AMLODIPINE BESYLATE 5 MG PO TABS: 5.0000 mg | ORAL_TABLET | Freq: Every day | ORAL | Status: DC

## 2023-03-09 MED ORDER — PANTOPRAZOLE SODIUM 40 MG PO TBEC
40.0000 mg | DELAYED_RELEASE_TABLET | Freq: Every day | ORAL | Status: DC
  Administered 2023-03-10: 40 mg via ORAL
  Filled 2023-03-09: qty 1

## 2023-03-09 MED ORDER — ADULT MULTIVITAMIN W/MINERALS CH
1.0000 | ORAL_TABLET | Freq: Every day | ORAL | Status: DC
  Administered 2023-03-10: 1 via ORAL
  Filled 2023-03-09: qty 1

## 2023-03-09 MED ORDER — METOPROLOL SUCCINATE ER 25 MG PO TB24
50.0000 mg | ORAL_TABLET | Freq: Every day | ORAL | Status: DC
  Administered 2023-03-09: 50 mg via ORAL
  Filled 2023-03-09: qty 2

## 2023-03-09 MED ORDER — ATORVASTATIN CALCIUM 10 MG PO TABS: 10.0000 mg | ORAL_TABLET | Freq: Every day | ORAL | Status: DC

## 2023-03-09 NOTE — Assessment & Plan Note (Addendum)
   AV fistula malfunction, right antecubital fossa Patient reports she is planning on having outpatient troubleshooting in IR clinic next week Of note clinically it has a nice thrill and bruit on examination IR consulted for [facility] as inpatient, continue OPD follow-up for this indication

## 2023-03-09 NOTE — Assessment & Plan Note (Signed)
Gout Continue the patient's outpatient febuxostat 40 mg

## 2023-03-09 NOTE — ED Notes (Signed)
Pt was given ginger ale and sandwich, crackers, and apple sauce.

## 2023-03-09 NOTE — Assessment & Plan Note (Signed)
Essential hypertension Systolic blood pressure 140s Continue the patient's amlodipine 5 mg daily as well as losartan 100 mg daily, metoprolol 50 mg extended release

## 2023-03-09 NOTE — Assessment & Plan Note (Signed)
  Hyperlipidemia Continue atorvastatin 10 mg daily

## 2023-03-09 NOTE — Assessment & Plan Note (Signed)
Gastroesophageal reflux disease Continue pantoprazole 40 mg daily

## 2023-03-09 NOTE — ED Notes (Signed)
PT ambulated to the bathroom w/o difficulty or assistance

## 2023-03-09 NOTE — ED Notes (Signed)
Pt went to dialysis on Wednesday and had some dialysis, but stated that they had issues with her dialysis.  Pt noted that she has no complaints such as SHOB, dizziness, headache, chest pain, or visual changes. Pt does have a fistula in the right arm, but it is not working.  She stated she's waiting on the cath lab at [facility] to fix her fistula.

## 2023-03-09 NOTE — Assessment & Plan Note (Signed)
    End-stage renal disease on hemodialysis Patient currently has difficulties with dialysis access namely she has a right upper extremity AV fistula as well as a right IJ tunneled dialysis catheter although reports infiltration of A/V Fistula and only 1 port working [facility], last dialysis was Wednesday with reduced flow rate of note the patient's electrolytes are within normal limits, patient does have some elevated BUN but no signs of uremia currently and the patient does not have any forward volume overload, no emergent indications for dialysis BUN 56, potassium is 4.1 and the sodium is 142, CO2 24 Plan: IR right IJ tunneled dialysis catheter exchange (consulted with thanks), hemodialysis as per nephrology after (consulted), follow electrolytes a.m. BMP ordered

## 2023-03-09 NOTE — H&P (Signed)
History and Physical    Patient: Rachel Villarreal ZOX:[id number] DOB: [birth year] DOA: 03/09/2023 DOS: the patient was seen and examined on 03/09/2023 PCP: Georgann Housekeeper, MD  Patient coming from: Home  Chief Complaint:  Chief Complaint  Patient presents with   Vascular Access Problem   HPI:  77 year old female with a past medical history of end-stage renal disease on hemodialysis, hypertension, breast cancer who presented to the emergency department given she was unable to to undergo outpatient hemodialysis.  The patient reports she is systemically well without any nausea or fatigue or pruritus however she has noticed as outpatient sometimes requiring 1 TDC port and 1 side of her AV fistula to be used simultaneously with significant infiltration ultimately leading to impaired dialysis efficiency and prompting her to come to the emergency department.  She reports she has plan for outpatient AV fistula troubleshooting.  Her last dialysis was Wednesday of this week, 03/07/2023.  As per the notes in the EMR: (last outpatient HD was on Wed-holiday sched, short run time of 1hr ).  4 hours.  F180.  Flow rates: 400/autoflow 1.5. ).  Past medical records reviewed and summarized patient has a nephrology consultation note from 4:59 PM today plan is for IR catheter exchange tomorrow with dialysis tomorrow and then plans for home 01/11/2023 vascular surgery note: Reviewed for AV fistula usage Twelve-lead EKG independently reviewed and interpreted from 11:51 AM today ventricular rate 67 sinus rhythm QTc 450 without any ST changes Case discussed with the emergency department physician and proceeded to place the patient in observation Review of Systems:  Constitutional: Denies Weight Loss, Fever, Chills or Night Sweats Respiratory: Denies Shortness of Breath, Cough, Hemoptysis, Wheezing, Pleurisy Cardiovascular: Denies Chest Pain, Paroxsymal Nocturnal Dyspnea, Palpitations, Edema Gastrointestinal: Denies  Nausea, Vomiting, Diarrhea, Hematemesis, Melena All other systems were reviewed and are negative Past Medical History:  Diagnosis Date   Abnormal Pap smear of vagina    Anemia    Asthma    Mild   Cancer (HCC)    left breast ILC   Chronic kidney disease (CKD)    stage 4   GERD (gastroesophageal reflux disease)    Gout 05/2012   Hernia, incisional    History of abnormal Pap smear    CIN III, 2001   History of blood transfusion    POST OP   History of uterine fibroid    Hypertension    Peritonitis (HCC)    Respiratory failure (HCC)    after ruptured appendix   Ruptured appendix    Seasonal allergies    Shingles 02/2013   very mild case   Urine incontinence    Past Surgical History:  Procedure Laterality Date   APPENDECTOMY     AV FISTULA PLACEMENT Right 04/24/2022   Procedure: RIGHT BRACHIOCEPHALIC ARTERIOVENOUS (AV) FISTULA CREATION;  Surgeon: Chuck Hint, MD;  Location: [facility] OR;  Service: Vascular;  Laterality: Right;   BREAST BIOPSY Right    fibrocystic changes   BREAST BIOPSY Left 08/02/2021   BREAST LUMPECTOMY WITH RADIOACTIVE SEED LOCALIZATION Left 09/12/2021   Procedure: LEFT BREAST LUMPECTOMY WITH RADIOACTIVE SEED LOCALIZATION;  Surgeon: Emelia Loron, MD;  Location: [facility];  Service: General;  Laterality: Left;   CATARACT EXTRACTION Bilateral    COLONOSCOPY     COLONOSCOPY WITH PROPOFOL N/A 02/18/2018   Procedure: COLONOSCOPY WITH PROPOFOL;  Surgeon: Vida Rigger, MD;  Location: [facility] END[facility]COPY;  Service: Endoscopy;  Laterality: N/A;  Use ultraslim scope    LEEP  2000  CIN 2/3   RE-EXCISION OF BREAST LUMPECTOMY Left 09/29/2021   Procedure: LEFT  BREAST RE-EXCISION LUMPECTOMY;  Surgeon: Emelia Loron, MD;  Location: [facility] ORS;  Service: General;  Laterality: Left;   REFRACTIVE SURGERY     SUPRACERVICAL ABDOMINAL HYSTERECTOMY  2001   Social History:  reports that she quit smoking about 50 years ago. Her smoking use included  cigarettes. She has never used smokeless tobacco. She reports that she does not drink alcohol and does not use drugs.  Allergies  Allergen Reactions   Ace Inhibitors     angioedema   Lactose Intolerance (Gi)    Letrozole Hives   Lisinopril Swelling   Mercury     Red eyes, through eye drops that contained thimerosal    Tape Dermatitis    plastic    Family History  Problem Relation Age of Onset   Colon cancer Father    Breast cancer Neg Hx     Prior to Admission medications   Medication Sig Start Date End Date Taking? Authorizing Provider  amLODipine (NORVASC) 2.5 MG tablet Take 2 tablets by mouth daily. evening 07/27/21   [provider]  anastrozole (ARIMIDEX) 1 MG tablet Take 1 tablet (1 mg total) by mouth daily. 09/07/22   Serena Croissant, MD  atorvastatin (LIPITOR) 10 MG tablet Take 10 mg by mouth daily.    [provider]  febuxostat (ULORIC) 40 MG tablet Take 40 mg by mouth daily.    [provider]  losartan (COZAAR) 100 MG tablet Take 100 mg by mouth daily. 07/27/21   [provider]  melatonin 5 MG TABS Take 1 tablet (5 mg total) by mouth at bedtime as needed. 08/29/21   Serena Croissant, MD  metoprolol succinate (TOPROL-XL) 50 MG 24 hr tablet Take 50 mg by mouth daily. At bedtime    [provider]  Multiple Vitamins-Minerals (PRESERVISION AREDS 2+MULTI VIT PO) Take 1 capsule by mouth daily.    [provider]  pantoprazole (PROTONIX) 40 MG tablet Take 40 mg by mouth daily.    [provider]  PROAIR HFA 108 (90 BASE) MCG/ACT inhaler Inhale 2 puffs into the lungs every 6 (six) hours as needed (for respiratory issues.).  12/03/14   [provider]    Physical Exam: Vitals:   03/09/23 1212 03/09/23 1545 03/09/23 2001  BP: (!) 164/68 (!) 164/69 (!) 140/57  Pulse: 74 74 81  Resp: 16 16 20   Temp: 97.6 F (36.4 C) 98.3 F (36.8 C) 97.8 F (36.6 C)  TempSrc:  Oral Oral  SpO2: 100% 100% 97%  Patient seen and  examined at 9:59 PM room 41 of the emergency department Constitutional:  Vital Signs as per [facility] than three noted] No Acute Distress Eyes:  Pink Conjunctiva and no Ptosis Extremity:   Right antecubital fossa has a AV fistula with thrill and bruit present, right IJ tunneled dialysis catheter without any surrounding erythema at the entry site Respiratory:   Respiratory Effort Normal: No Use of Respiratory Muscles,No  Intercostal Retractions             Lungs Clear to Auscultation Bilaterally Cardiovascular:   Heart Auscultated: Regular Regular without any added sounds or murmurs              No Lower Extremity Edema Gastrointestinal:  Abdomen soft and nontender without palpable masses, guarding or rebound  No Palpable Splenomegaly or Hepatomegaly Psychiatric:  Patient Orientated to Time, Place and Person Patient with appropriate mood and  affect Recent and Remote Memory Intact   Data Reviewed: Labs, Radiology, ECG as detailed in HPI and A/P   Assessment and Plan: * ESRD (end stage renal disease) (HCC)    End-stage renal disease on hemodialysis Patient currently has difficulties with dialysis access namely she has a right upper extremity AV fistula as well as a right IJ tunneled dialysis catheter although reports infiltration of A/V Fistula and only 1 port working [facility], last dialysis was Wednesday with reduced flow rate of note the patient's electrolytes are within normal limits, patient does have some elevated BUN but no signs of uremia currently and the patient does not have any forward volume overload, no emergent indications for dialysis BUN 56, potassium is 4.1 and the sodium is 142, CO2 24 Plan: IR right IJ tunneled dialysis catheter exchange (consulted with thanks), hemodialysis as per nephrology after (consulted), follow electrolytes a.m. BMP ordered  Insomnia  Insomnia Continue melatonin 5 mg as needed  A-V fistula (HCC)   AV fistula malfunction, right  antecubital fossa Patient reports she is planning on having outpatient troubleshooting in IR clinic next week Of note clinically it has a nice thrill and bruit on examination IR consulted for [facility] as inpatient, continue OPD follow-up for this indication  Breast cancer [facility])  Breast cancer Continue outpatient follow-up, last outpatient oncology visit 11/27/2022 reviewed and she was noted to be tolerating anastrozole at that time Continue the patient's anastrozole 1 mg daily  GERD (gastroesophageal reflux disease)   Gastroesophageal reflux disease Continue pantoprazole 40 mg daily  Hyperlipidemia  Hyperlipidemia Continue atorvastatin 10 mg daily   Essential hypertension Essential hypertension Systolic blood pressure 140s Continue the patient's amlodipine 5 mg daily as well as losartan 100 mg daily, metoprolol 50 mg extended release  Gout Gout Continue the patient's outpatient febuxostat 40 mg     Advance Care Planning:   Code Status: Full Code as per patient  Consults: Nephrology, Interventional Radiology  Family Communication: Patient reports updated family, no further update requested  Severity of Illness: The appropriate patient status for this patient is OBSERVATION. Observation status is judged to be reasonable and necessary in order to provide the required intensity of service to ensure the patient's safety. The patient's presenting symptoms, physical exam findings, and initial radiographic and laboratory data in the context of their medical condition is felt to place them at decreased risk for further clinical deterioration. Furthermore, it is anticipated that the patient will be medically stable for discharge from the hospital within 2 midnights of admission.   Time spent 58 minutes   Author: Princess Bruins, MD 03/09/2023 11:34 PM  For on call review [email or web address].

## 2023-03-09 NOTE — ED Provider Notes (Signed)
[place] EMERGENCY DEPARTMENT AT [facility] Provider Note   CSN: [id number] Arrival date & time: 03/09/23  1133     History Chief Complaint  Patient presents with   Vascular Access Problem    HPI Rachel Villarreal is a 77 y.o. female presenting for chief complaint of clogged dialysis catheter.  Has a right-sided Port-A-Cath for end-stage renal disease.  Monday Wednesday Friday dialysis.  Had to have a catheter put in for failed AV fistula. They are planning to interrogate the AV fistula in IR clinic next week.  Patient's recorded medical, surgical, social, medication list and allergies were reviewed in the Snapshot window as part of the initial history.   Review of Systems   Review of Systems  Constitutional:  Negative for chills and fever.  HENT:  Negative for ear pain and sore throat.   Eyes:  Negative for pain and visual disturbance.  Respiratory:  Negative for cough and shortness of breath.   Cardiovascular:  Negative for chest pain and palpitations.  Gastrointestinal:  Negative for abdominal pain and vomiting.  Genitourinary:  Negative for dysuria and hematuria.  Musculoskeletal:  Negative for arthralgias and back pain.  Skin:  Negative for color change and rash.  Neurological:  Negative for seizures and syncope.  All other systems reviewed and are negative.   Physical Exam Updated Vital Signs BP (!) 140/57 (BP Location: Left Arm)   Pulse 81   Temp 97.8 F (36.6 C) (Oral)   Resp 20   SpO2 97%  Physical Exam Constitutional:      General: She is not in acute distress.    Appearance: She is not ill-appearing or toxic-appearing.  HENT:     Head: Normocephalic and atraumatic.  Eyes:     Extraocular Movements: Extraocular movements intact.     Pupils: Pupils are equal, round, and reactive to light.  Cardiovascular:     Rate and Rhythm: Normal rate.  Pulmonary:     Effort: No respiratory distress.  Abdominal:     General: Abdomen is flat.   Musculoskeletal:        General: No swelling, deformity or signs of injury.     Cervical back: Normal range of motion. No rigidity.  Skin:    General: Skin is warm and dry.  Neurological:     General: No focal deficit present.     Mental Status: She is alert and oriented to person, place, and time.  Psychiatric:        Mood and Affect: Mood normal.      ED Course/ Medical Decision Making/ A&P    Procedures Procedures   Medications Ordered in ED Medications  Chlorhexidine Gluconate Cloth 2 % PADS 6 each (has no administration in time range)    Medical Decision Making:   77 year old female presenting with 2 episodes of missed dialysis secondary to failed tunneled catheter.  Her fistula does not work. Consulted nephrology who recommended admission due to recurrent missed dialysis for tunneled catheter replacement, test with follow-up session of dialysis and likely discharge if successful.  However there is no available I interventional radiology intervention tonight. Therefore they recommended admission. Disposition:   Based on the above findings, I believe this patient is stable for admission.    Patient/family educated about specific findings on our evaluation and explained exact reasons for admission.  Patient/family educated about clinical situation and time was allowed to answer questions.   Admission team communicated with and agreed with need for admission. Patient admitted.  Patient ready to move at this time.     Emergency Department Medication Summary:   Medications  Chlorhexidine Gluconate Cloth 2 % PADS 6 each (has no administration in time range)        Clinical Impression:  1. ESRD (end stage renal disease) (HCC)      Admit   Final Clinical Impression(s) / ED Diagnoses Final diagnoses:  ESRD (end stage renal disease) (HCC)    Rx / DC Orders ED Discharge Orders     None         Glyn Ade, MD 03/09/23 2315

## 2023-03-09 NOTE — Assessment & Plan Note (Signed)
  Breast cancer Continue outpatient follow-up, last outpatient oncology visit 11/27/2022 reviewed and she was noted to be tolerating anastrozole at that time Continue the patient's anastrozole 1 mg daily

## 2023-03-09 NOTE — Consult Note (Signed)
ESRD Consult Note  Reason for consult: ESRD, provision of dialysis, catheter malfunction  Assessment/Recommendations:  ESRD -outpatient HD orders: [facility]. TTS (last outpatient HD was on Wed-holiday sched, short run time of 1hr ).  4 hours.  F180.  Flow rates: 400/autoflow 1.5.  2K/2.5 calcium.  EDW 63.4 kg.  Heparin: 1900 units bolus.  Meds: Mircera 50 mcg every 2 weeks (last dose 11/21), due to start Venofer 100 mg with treatment  -HD tomorrow once new access obtained. Would anticipate discharge after HD tomorrow. No indications for urgent HD tonight-lungs clear on exam, sat'ing well on RA, K WNL  Catheter malfunction -IR consulted for catheter exchange, appreciate assistance. NPO after midnight  Volume/ hypertension  -UF as tolerated, resume home meds  Anemia of Chronic Kidney Disease Hemoglobin 11.6, at goal, not due for ESA  -Transfuse PRN for Hgb <7  Secondary Hyperparathyroidism/Hyperphosphatemia - resume home binders if on any, chk phos   Recommendations were discussed with ER MD.  Anthony Sar, MD [facility]  History of Present Illness: Rachel Villarreal is a/an 77 y.o. female with a past medical history of ESRD, hypertension, history of breast cancer who presents with catheter malfunction.  Her TDC has not been running well, her last treatment was on Wednesday based on the holiday schedule and per charting they could not get her blood flow rate above 100.  She had been referred for exchange however there have been delays.  She does have a pre-existing RUE AVF which keeps infiltrating with cannulation and she is pending a fistulogram with [facility].  Patient seen and examined in the ER and she reports feeling "perfectly fine."  Denies any chest pain, shortness of breath, nausea/vomiting, aVF pain.   Medications:  Current Facility-Administered Medications  Medication Dose Route Frequency Provider Last Rate Last Admin   [START ON 03/10/2023] Chlorhexidine Gluconate  Cloth 2 % PADS 6 each  6 each Topical Q0600 Anthony Sar, MD       Current Outpatient Medications  Medication Sig Dispense Refill   acetaminophen (TYLENOL) 500 MG tablet Take 500 mg by mouth every 6 (six) hours as needed for moderate pain.     amLODipine (NORVASC) 2.5 MG tablet Take 2 tablets by mouth daily. evening     anastrozole (ARIMIDEX) 1 MG tablet Take 1 tablet (1 mg total) by mouth daily. 90 tablet 3   atorvastatin (LIPITOR) 10 MG tablet Take 10 mg by mouth daily.     budesonide (RHINOCORT AQUA) 32 MCG/ACT nasal spray Place 2 sprays into both nostrils daily.     Cholecalciferol 50 MCG (2000 UT) CAPS 1 tablet Orally once a day     diclofenac Sodium (VOLTAREN) 1 % GEL Apply 1 Application topically 3 (three) times daily as needed (pain).     febuxostat (ULORIC) 40 MG tablet Take 40 mg by mouth daily.     hydrocortisone cream 1 % Apply 1 Application topically 2 (two) times daily as needed for itching.     Lidocaine 0.5 % AERO as directed Externally as needed     loratadine (CLARITIN) 10 MG tablet Take 10 mg by mouth 2 (two) times daily.     losartan (COZAAR) 100 MG tablet Take 100 mg by mouth daily.     melatonin 5 MG TABS Take 1 tablet (5 mg total) by mouth at bedtime as needed.  0   metoprolol succinate (TOPROL-XL) 50 MG 24 hr tablet Take 50 mg by mouth daily. At bedtime     Multiple  Vitamins-Minerals (PRESERVISION AREDS 2+MULTI VIT PO) Take 1 capsule by mouth daily.     pantoprazole (PROTONIX) 40 MG tablet Take 40 mg by mouth daily.     PROAIR HFA 108 (90 BASE) MCG/ACT inhaler Inhale 2 puffs into the lungs every 6 (six) hours as needed (for respiratory issues.).        ALLERGIES Ace inhibitors, Lactose intolerance (gi), Letrozole, Lisinopril, Mercury, and Tape  MEDICAL HISTORY Past Medical History:  Diagnosis Date   Abnormal Pap smear of vagina    Anemia    Asthma    Mild   Cancer (HCC)    left breast ILC   Chronic kidney disease (CKD)    stage 4   GERD (gastroesophageal  reflux disease)    Gout 05/2012   Hernia, incisional    History of abnormal Pap smear    CIN III, 2001   History of blood transfusion    POST OP   History of uterine fibroid    Hypertension    Peritonitis (HCC)    Respiratory failure (HCC)    after ruptured appendix   Ruptured appendix    Seasonal allergies    Shingles 02/2013   very mild case   Urine incontinence      SOCIAL HISTORY Social History   Socioeconomic History   Marital status: Divorced    Spouse name: Not on file   Number of children: Not on file   Years of education: Not on file   Highest education level: Not on file  Occupational History   Not on file  Tobacco Use   Smoking status: Former    Current packs/day: 0.00    Types: Cigarettes    Quit date: 04/10/1972    Years since quitting: 50.9   Smokeless tobacco: Never  Vaping Use   Vaping status: Never Used  Substance and Sexual Activity   Alcohol use: No   Drug use: No   Sexual activity: Not Currently    Partners: Male    Birth control/protection: Surgical    Comment: hysterectomy  Other Topics Concern   Not on file  Social History Narrative   Not on file   Social Determinants of Health   Financial Resource Strain: Low Risk  (09/02/2021)   Overall Financial Resource Strain (CARDIA)    Difficulty of Paying Living Expenses: Not hard at all  Food Insecurity: No Food Insecurity (09/02/2021)   Hunger Vital Sign    Worried About Running Out of Food in the Last Year: Never true    Ran Out of Food in the Last Year: Never true  Transportation Needs: No Transportation Needs (09/02/2021)   PRAPARE - Administrator, Civil Service (Medical): No    Lack of Transportation (Non-Medical): No  Physical Activity: Not on file  Stress: No Stress Concern Present (09/02/2021)   [employer] of Occupational Health - Occupational Stress Questionnaire    Feeling of Stress : Not at all  Social Connections: Not on file  Intimate Partner Violence: Not  on file     FAMILY HISTORY Family History  Problem Relation Age of Onset   Colon cancer Father    Breast cancer Neg Hx      Review of Systems: 12 systems were reviewed and negative except per HPI  Physical Exam: Vitals:   03/09/23 1212 03/09/23 1545  BP: (!) 164/68 (!) 164/69  Pulse: 74 74  Resp: 16 16  Temp: 97.6 F (36.4 C) 98.3 F (36.8 C)  SpO2: 100%  100%   No intake/output data recorded. No intake or output data in the 24 hours ending 03/09/23 1659 General: well-appearing, no acute distress HEENT: anicteric sclera, MMM CV: normal rate, no murmurs Lungs: CTA B/L, bilateral chest rise, normal wob Abd: soft, non-tender, non-distended Skin: no visible lesions or rashes Msk: trace edema b/l ankles Psych: alert, engaged, appropriate mood and affect Neuro: normal speech, no gross focal deficits  Dialysis access: RUE AVF +b/t, right [facility]  Test Results Reviewed Lab Results  Component Value Date   NA 142 03/09/2023   K 4.1 03/09/2023   CL 103 03/09/2023   CO2 24 03/09/2023   BUN 56 (H) 03/09/2023   CREATININE 6.05 (H) 03/09/2023   CALCIUM 9.2 03/09/2023   ALBUMIN 3.9 02/09/2023    I have reviewed relevant outside healthcare records

## 2023-03-09 NOTE — ED Triage Notes (Signed)
Pt states her dialysis catheter in her right chest is clogged. Pt last dialysis was Monday. Pt has no complaints

## 2023-03-09 NOTE — ED Notes (Signed)
ED TO INPATIENT HANDOFF REPORT  ED Nurse Name and Phone #: Angelique Holm RN [phone]  S Name/Age/Gender Rachel Villarreal 77 y.o. female Room/Bed: 041C/041C  Code Status   Code Status: Full Code  Home/SNF/Other Home Patient oriented to: self, place, time, and situation Is this baseline? Yes   Triage Complete: Triage complete  Chief Complaint ESRD (end stage renal disease) (HCC) [N18.6]  Triage Note Pt states her dialysis catheter in her right chest is clogged. Pt last dialysis was Monday. Pt has no complaints   Allergies Allergies  Allergen Reactions   Ace Inhibitors     angioedema   Lactose Intolerance (Gi)    Letrozole Hives   Lisinopril Swelling   Mercury     Red eyes, through eye drops that contained thimerosal    Tape Dermatitis    plastic    Level of Care/Admitting Diagnosis ED Disposition     ED Disposition  Admit   Condition  --   Comment  Hospital Area: [facility] [[id number]]  Level of Care: Med-Surg [16]  May place patient in observation at [facility] or [facility] if equivalent level of care is available:: No  Covid Evaluation: Asymptomatic - no recent exposure (last 10 days) testing not required  Diagnosis: ESRD (end stage renal disease) [facility] [[id number]]  Admitting Physician: Princess Bruins [[id number]]  Attending Physician: Princess Bruins [[id number]]          B Medical/Surgery History Past Medical History:  Diagnosis Date   Abnormal Pap smear of vagina    Anemia    Asthma    Mild   Cancer (HCC)    left breast ILC   Chronic kidney disease (CKD)    stage 4   GERD (gastroesophageal reflux disease)    Gout 05/2012   Hernia, incisional    History of abnormal Pap smear    CIN III, 2001   History of blood transfusion    POST OP   History of uterine fibroid    Hypertension    Peritonitis (HCC)    Respiratory failure (HCC)    after ruptured appendix   Ruptured appendix    Seasonal allergies    Shingles 02/2013   very mild  case   Urine incontinence    Past Surgical History:  Procedure Laterality Date   APPENDECTOMY     AV FISTULA PLACEMENT Right 04/24/2022   Procedure: RIGHT BRACHIOCEPHALIC ARTERIOVENOUS (AV) FISTULA CREATION;  Surgeon: Chuck Hint, MD;  Location: [facility] OR;  Service: Vascular;  Laterality: Right;   BREAST BIOPSY Right    fibrocystic changes   BREAST BIOPSY Left 08/02/2021   BREAST LUMPECTOMY WITH RADIOACTIVE SEED LOCALIZATION Left 09/12/2021   Procedure: LEFT BREAST LUMPECTOMY WITH RADIOACTIVE SEED LOCALIZATION;  Surgeon: Emelia Loron, MD;  Location: [facility];  Service: General;  Laterality: Left;   CATARACT EXTRACTION Bilateral    COLONOSCOPY     COLONOSCOPY WITH PROPOFOL N/A 02/18/2018   Procedure: COLONOSCOPY WITH PROPOFOL;  Surgeon: Vida Rigger, MD;  Location: [facility];  Service: Endoscopy;  Laterality: N/A;  Use ultraslim scope    LEEP  2000   CIN 2/3   RE-EXCISION OF BREAST LUMPECTOMY Left 09/29/2021   Procedure: LEFT  BREAST RE-EXCISION LUMPECTOMY;  Surgeon: Emelia Loron, MD;  Location: [facility];  Service: General;  Laterality: Left;   REFRACTIVE SURGERY     SUPRACERVICAL ABDOMINAL HYSTERECTOMY  2001     A IV Location/Drains/Wounds Patient Lines/Drains/Airways Status  Active Line/Drains/Airways     Name Placement date Placement time Site Days   Fistula / Graft Left Upper arm Arteriovenous fistula 04/24/22  0817  Upper arm  319            Intake/Output Last 24 hours No intake or output data in the 24 hours ending 03/09/23 2339  Labs/Imaging Results for orders placed or performed during the hospital encounter of 03/09/23 (from the past 48 hour(s))  Basic metabolic panel     Status: Abnormal   Collection Time: 03/09/23 12:20 PM  Result Value Ref Range   Sodium 142 135 - 145 mmol/L   Potassium 4.1 3.5 - 5.1 mmol/L   Chloride 103 98 - 111 mmol/L   CO2 24 22 - 32 mmol/L   Glucose, Bld 99 70 - 99 mg/dL    Comment: Glucose  reference range applies only to samples taken after fasting for at least 8 hours.   BUN 56 (H) 8 - 23 mg/dL   Creatinine, Ser 8.75 (H) 0.44 - 1.00 mg/dL   Calcium 9.2 8.9 - 64.3 mg/dL   GFR, Estimated 7 (L) >60 mL/min    Comment: (NOTE) Calculated using the CKD-EPI Creatinine Equation (2021)    Anion gap 15 5 - 15    Comment: Performed at [facility], [address], [place], [place] [address]  CBC     Status: Abnormal   Collection Time: 03/09/23 12:20 PM  Result Value Ref Range   WBC 7.7 4.0 - 10.5 K/uL   RBC 3.69 (L) 3.87 - 5.11 MIL/uL   Hemoglobin 11.6 (L) 12.0 - 15.0 g/dL   HCT 88.4 16.6 - 06.3 %   MCV 97.6 80.0 - 100.0 fL   MCH 31.4 26.0 - 34.0 pg   MCHC 32.2 30.0 - 36.0 g/dL   RDW 01.6 01.0 - 93.2 %   Platelets 218 150 - 400 K/uL   nRBC 0.0 0.0 - 0.2 %    Comment: Performed at [facility], [address] [place], [place] [address]   No results found.  Pending Labs Unresulted Labs (From admission, onward)     Start     Ordered   03/10/23 0500  Basic metabolic panel  Tomorrow morning,   R        03/09/23 2314   03/09/23 2322  CBC  (heparin)  Once,   R       Comments: Baseline for heparin therapy IF NOT ALREADY DRAWN.  Notify MD if PLT < 100 K.    03/09/23 2322   03/09/23 2322  Creatinine, serum  (heparin)  Once,   R       Comments: Baseline for heparin therapy IF NOT ALREADY DRAWN.    03/09/23 2322   03/09/23 1641  Hepatitis B surface antigen  (New Admission [facility] (Hepatitis B))  Once,   URGENT        03/09/23 1640   03/09/23 1641  Hepatitis B surface antibody,quantitative  (New Admission [facility] (Hepatitis B))  Once,   URGENT        03/09/23 1640   Signed and Held  Renal function panel  Once,   R        Signed and Held   Signed and Held  CBC  Once,   R        Signed and Held            Vitals/Pain Today's Vitals   03/09/23 1545 03/09/23 1756 03/09/23  2001 03/09/23 2335  BP: (!) 164/69  (!) 140/57 138/63  Pulse: 74  81 82  Resp: 16   20 18   Temp: 98.3 F (36.8 C)  97.8 F (36.6 C)   TempSrc: Oral  Oral   SpO2: 100%  97% 97%  PainSc:  0-No pain  0-No pain    Isolation Precautions No active isolations  Medications Medications  Chlorhexidine Gluconate Cloth 2 % PADS 6 each (has no administration in time range)  febuxostat (ULORIC) tablet 40 mg (has no administration in time range)  anastrozole (ARIMIDEX) tablet 1 mg (has no administration in time range)  amLODipine (NORVASC) tablet 5 mg (has no administration in time range)  atorvastatin (LIPITOR) tablet 10 mg (has no administration in time range)  losartan (COZAAR) tablet 100 mg (has no administration in time range)  metoprolol succinate (TOPROL-XL) 24 hr tablet 50 mg (50 mg Oral Given 03/09/23 2336)  pantoprazole (PROTONIX) EC tablet 40 mg (has no administration in time range)  melatonin tablet 5 mg (5 mg Oral Given 03/09/23 2336)  multivitamin with minerals tablet 1 tablet (has no administration in time range)  albuterol (PROVENTIL) (2.5 MG/3ML) 0.083% nebulizer solution 2.5 mg (has no administration in time range)  heparin injection 5,000 Units (has no administration in time range)    Mobility walks     Focused Assessments Renal Assessment Handoff:  Hemodialysis Schedule: Hemodialysis Schedule: Tuesday/Thursday/Saturday Last Hemodialysis date and time: Wednesday   Restricted appendage: right  chest temp./right arm fistula   R Recommendations: See Admitting Provider Note  Report given to:   Additional Notes: .

## 2023-03-09 NOTE — Progress Notes (Signed)
Informed of patient in ER. Discussed ER MD. TDC nonfunctional, there have been issues with her outpatient dialysis unit, not able to get her blood flow rate above 100. Needs exchange. IR to be contacted. Of note, she does have a AVF that easily infiltrates and is pending a fgram with [facility].  K WNL, 100% on RA per chart. Will plan for HD tomorrow once new access has been obtained. Full consult to follow. Will keep NPO. Please call with any questions/concerns.  Outpatient HD orders:  [facility]. TTS (last outpatient HD was on Wed-holiday sched, short run time of 1hr ).  4 hours.  F180.  Flow rates: 400/autoflow 1.5.  2K/2.5 calcium.  EDW 63.4 kg.  Heparin: 1900 units bolus.  Meds: Mircera 50 mcg every 2 weeks (last dose 11/21), due to start Venofer 100 mg with treatment  Anthony Sar, MD [facility]

## 2023-03-09 NOTE — Assessment & Plan Note (Signed)
  Insomnia Continue melatonin 5 mg as needed

## 2023-03-10 ENCOUNTER — Observation Stay (HOSPITAL_COMMUNITY): Payer: Medicare Other

## 2023-03-10 DIAGNOSIS — I120 Hypertensive chronic kidney disease with stage 5 chronic kidney disease or end stage renal disease: Secondary | ICD-10-CM | POA: Diagnosis not present

## 2023-03-10 DIAGNOSIS — Z992 Dependence on renal dialysis: Secondary | ICD-10-CM | POA: Diagnosis not present

## 2023-03-10 DIAGNOSIS — N186 End stage renal disease: Secondary | ICD-10-CM | POA: Diagnosis not present

## 2023-03-10 DIAGNOSIS — T82598A Other mechanical complication of other cardiac and vascular devices and implants, initial encounter: Secondary | ICD-10-CM | POA: Diagnosis not present

## 2023-03-10 HISTORY — PX: IR CHEST FLUORO: IMG2383

## 2023-03-10 HISTORY — PX: IR FLUORO GUIDE CV LINE RIGHT: IMG2283

## 2023-03-10 LAB — RENAL FUNCTION PANEL
Albumin: 3 g/dL — ABNORMAL LOW (ref 3.5–5.0)
Anion gap: 12 (ref 5–15)
BUN: 62 mg/dL — ABNORMAL HIGH (ref 8–23)
CO2: 22 mmol/L (ref 22–32)
Calcium: 8.8 mg/dL — ABNORMAL LOW (ref 8.9–10.3)
Chloride: 108 mmol/L (ref 98–111)
Creatinine, Ser: 6.32 mg/dL — ABNORMAL HIGH (ref 0.44–1.00)
GFR, Estimated: 6 mL/min — ABNORMAL LOW (ref >60)
Glucose, Bld: 73 mg/dL (ref 70–99)
Phosphorus: 9.1 mg/dL — ABNORMAL HIGH (ref 2.5–4.6)
Potassium: 4.1 mmol/L (ref 3.5–5.1)
Sodium: 142 mmol/L (ref 135–145)

## 2023-03-10 LAB — BASIC METABOLIC PANEL
Anion gap: 14 (ref 5–15)
BUN: 63 mg/dL — ABNORMAL HIGH (ref 8–23)
CO2: 22 mmol/L (ref 22–32)
Calcium: 9 mg/dL (ref 8.9–10.3)
Chloride: 107 mmol/L (ref 98–111)
Creatinine, Ser: 6.62 mg/dL — ABNORMAL HIGH (ref 0.44–1.00)
GFR, Estimated: 6 mL/min — ABNORMAL LOW (ref >60)
Glucose, Bld: 73 mg/dL (ref 70–99)
Potassium: 4.2 mmol/L (ref 3.5–5.1)
Sodium: 143 mmol/L (ref 135–145)

## 2023-03-10 LAB — CREATININE, SERUM
Creatinine, Ser: 6.59 mg/dL — ABNORMAL HIGH (ref 0.44–1.00)
GFR, Estimated: 6 mL/min — ABNORMAL LOW (ref >60)

## 2023-03-10 LAB — CBC
HCT: 33 % — ABNORMAL LOW (ref 36.0–46.0)
Hemoglobin: 11.4 g/dL — ABNORMAL LOW (ref 12.0–15.0)
MCH: 33.2 pg (ref 26.0–34.0)
MCHC: 34.5 g/dL (ref 30.0–36.0)
MCV: 96.2 fL (ref 80.0–100.0)
Platelets: 202 10*3/uL (ref 150–400)
RBC: 3.43 MIL/uL — ABNORMAL LOW (ref 3.87–5.11)
RDW: 14.8 % (ref 11.5–15.5)
WBC: 8.1 10*3/uL (ref 4.0–10.5)
nRBC: 0 % (ref 0.0–0.2)

## 2023-03-10 LAB — HEPATITIS B SURFACE ANTIGEN: Hepatitis B Surface Ag: NONREACTIVE

## 2023-03-10 MED ORDER — ACETAMINOPHEN 325 MG PO TABS
650.0000 mg | ORAL_TABLET | Freq: Four times a day (QID) | ORAL | Status: DC | PRN
  Administered 2023-03-10: 650 mg via ORAL

## 2023-03-10 MED ORDER — ALTEPLASE 2 MG IJ SOLR: 2.0000 mg | Freq: Once | INTRAMUSCULAR | Status: DC | PRN

## 2023-03-10 MED ORDER — HEPARIN SODIUM (PORCINE) 1000 UNIT/ML DIALYSIS: 1000.0000 [IU] | INTRAMUSCULAR | Status: DC | PRN

## 2023-03-10 MED ORDER — PENTAFLUOROPROP-TETRAFLUOROETH EX AERO: 1.0000 | INHALATION_SPRAY | CUTANEOUS | Status: DC | PRN

## 2023-03-10 MED ORDER — HEPARIN SODIUM (PORCINE) 1000 UNIT/ML IJ SOLN
INTRAMUSCULAR | Status: AC
  Filled 2023-03-10: qty 10

## 2023-03-10 MED ORDER — LIDOCAINE HCL 1 % IJ SOLN
INTRAMUSCULAR | Status: AC
  Filled 2023-03-10: qty 20

## 2023-03-10 MED ORDER — LIDOCAINE HCL (PF) 1 % IJ SOLN: 5.0000 mL | INTRAMUSCULAR | Status: DC | PRN

## 2023-03-10 MED ORDER — ANTICOAGULANT SODIUM CITRATE 4% (200MG/5ML) IV SOLN: 5.0000 mL | Status: DC | PRN

## 2023-03-10 MED ORDER — LIDOCAINE-PRILOCAINE 2.5-2.5 % EX CREA: 1.0000 | TOPICAL_CREAM | CUTANEOUS | Status: DC | PRN

## 2023-03-10 MED ORDER — HEPARIN SODIUM (PORCINE) 1000 UNIT/ML IJ SOLN
INTRAMUSCULAR | Status: AC
  Filled 2023-03-10: qty 4

## 2023-03-10 MED ORDER — LIDOCAINE HCL 1 % IJ SOLN
10.0000 mL | Freq: Once | INTRAMUSCULAR | Status: DC
  Filled 2023-03-10: qty 10

## 2023-03-10 NOTE — Progress Notes (Signed)
   03/10/23 1845  Vitals  Temp 98.4 F (36.9 C)  Pulse Rate 78  Resp (!) 23  BP (!) 149/66  SpO2 96 %  O2 Device Room Air  Weight 62.6 kg  Type of Weight Post-Dialysis  Oxygen Therapy  Patient Activity (if Appropriate) In bed  Post Treatment  Dialyzer Clearance Lightly streaked  Liters Processed 84  Fluid Removed (mL) 2000 mL  Tolerated HD Treatment Yes  Post-Hemodialysis Comments HD tolerated without incident.   Received patient in bed to unit.  Alert and oriented.  Informed consent signed and in chart.   TX duration: 3.5 hrs  Patient tolerated well.  Transported back to the room  Alert, without acute distress.  Hand-off given to patient's nurse.   Access used: R CVC Access issues: none  Total UF removed: 2L

## 2023-03-10 NOTE — Care Management Obs Status (Signed)
MEDICARE OBSERVATION STATUS NOTIFICATION   Patient Details  Name: Rachel Villarreal MRN: [id number] Date of Birth: 1945/04/24   Medicare Observation Status Notification Given:  Yes    Ronny Bacon, RN 03/10/2023, 12:11 PM

## 2023-03-10 NOTE — Progress Notes (Signed)
  [facility] Progress Note    Assessment/ Plan:   ESRD -outpatient HD orders: [facility]. TTS (last outpatient HD was on Wed-holiday sched, short run time of 1hr ).  4 hours.  F180.  Flow rates: 400/autoflow 1.5.  2K/2.5 calcium.  EDW 63.4 kg.  Heparin: 1900 units bolus.  Meds: Mircera 50 mcg every 2 weeks (last dose 11/21), due to start Venofer 100 mg with treatment  -HD today-on TTS sched. Okay for discharge postHD provided no issues.   Catheter malfunction -s/p right tdc exchange with IR 11/30   Volume/ hypertension  -UF as tolerated, resume home meds   Anemia of Chronic Kidney Disease Hemoglobin 11.4, at goal, not due for ESA  -Transfuse PRN for Hgb <7   Secondary Hyperparathyroidism/Hyperphosphatemia - phos 9.1 preHD--resume home binders if on any  Subjective:   S/p tdc exchange this AM w/ IR. Feels well. Ready to go home post HD, should be going soon once machine is set up   Objective:   BP (!) 175/68 (BP Location: Left Arm)   Pulse 70   Temp 97.7 F (36.5 C) (Oral)   Resp 18   Ht 5\' 2"  (1.575 m)   Wt 63.5 kg   SpO2 97%   BMI 25.60 kg/m  No intake or output data in the 24 hours ending 03/10/23 1222 Weight change:   Physical Exam: Gen:NAD CVS: RRR Resp:normal wob Abd: ND Ext: trace edema b/l Les Neuro: awake, alert, ambulating in room Dialysis access: Right TDC, RUE AVF +b/t  Imaging: No results found.  Labs: BMET Recent Labs  Lab 03/09/23 1220 03/10/23 0225 03/10/23 0226 03/10/23 0227  NA 142 143  --  142  K 4.1 4.2  --  4.1  CL 103 107  --  108  CO2 24 22  --  22  GLUCOSE 99 73  --  73  BUN 56* 63*  --  62*  CREATININE 6.05* 6.62* 6.59* 6.32*  CALCIUM 9.2 9.0  --  8.8*  PHOS  --   --   --  9.1*   CBC Recent Labs  Lab 03/09/23 1220 03/10/23 0227  WBC 7.7 8.1  HGB 11.6* 11.4*  HCT 36.0 33.0*  MCV 97.6 96.2  PLT 218 202    Medications:     amLODipine  5 mg Oral Daily   anastrozole  1 mg Oral Daily   atorvastatin   10 mg Oral Daily   Chlorhexidine Gluconate Cloth  6 each Topical Q0600   febuxostat  40 mg Oral Daily   heparin  5,000 Units Subcutaneous Q8H   lidocaine  10 mL Infiltration Once   losartan  100 mg Oral Daily   metoprolol succinate  50 mg Oral QHS   multivitamin with minerals  1 tablet Oral Daily   pantoprazole  40 mg Oral Daily      Anthony Sar, MD [facility] 03/10/2023, 12:22 PM

## 2023-03-10 NOTE — Plan of Care (Signed)

## 2023-03-10 NOTE — Plan of Care (Signed)
  Problem: Nutrition: Goal: Adequate nutrition will be maintained Outcome: Progressing   Problem: Pain Management: Goal: General experience of comfort will improve Outcome: Progressing   Problem: Safety: Goal: Ability to remain free from injury will improve Outcome: Progressing   Problem: Skin Integrity: Goal: Risk for impaired skin integrity will decrease Outcome: Progressing

## 2023-03-10 NOTE — Progress Notes (Signed)
   Patient Status: [facility] - In-pt  Assessment and Plan: Patient in need of venous access.  Tunn HD catheter exchange  ______________________________________________________________________   History of Present Illness: Rachel Villarreal is a 77 y.o. female  ESRD Tunn HD cath placed with VVS - outside facility Has existing RUE dialysis fistula-- continues to infiltrate per MD Last dialysis with HD cath 11/25- complete Dialysis 11/27:  1 hour Now clogged; attempts to clear unsuccessful Dialysis Ctr sent to ED for exchange--- needs dialysis per Nephrology  Allergies and medications reviewed.   Review of Systems: A 12 point ROS discussed and pertinent positives are indicated in the HPI above.  All other systems are negative.    Vital Signs: BP (!) 156/76 (BP Location: Left Arm)   Pulse 68   Temp (!) 97.5 F (36.4 C) (Oral)   Resp 18   Ht 5\' 2"  (1.575 m)   Wt 139 lb 15.9 oz (63.5 kg)   SpO2 100%   BMI 25.60 kg/m   Physical Exam Vitals reviewed.  HENT:     Mouth/Throat:     Mouth: Mucous membranes are moist.  Cardiovascular:     Rate and Rhythm: Normal rate and regular rhythm.     Heart sounds: Normal heart sounds.  Pulmonary:     Effort: Pulmonary effort is normal.     Breath sounds: Normal breath sounds. No wheezing.  Abdominal:     Palpations: Abdomen is soft.  Musculoskeletal:        General: Normal range of motion.  Skin:    General: Skin is warm.  Neurological:     Mental Status: She is alert and oriented to person, place, and time.  Psychiatric:        Behavior: Behavior normal.      Imaging reviewed.   Labs:  COAGS: No results for input(s): "INR", "APTT" in the last 8760 hours.  BMP: Recent Labs    02/09/23 1021 03/09/23 1220 03/10/23 0225 03/10/23 0226 03/10/23 0227  NA 142 142 143  --  142  K 3.4* 4.1 4.2  --  4.1  CL 102 103 107  --  108  CO2 31 24 22   --  22  GLUCOSE 101* 99 73  --  73  BUN 25* 56* 63*  --  62*  CALCIUM 9.5 9.2  9.0  --  8.8*  CREATININE 3.27* 6.05* 6.62* 6.59* 6.32*  GFRNONAA 14* 7* 6* 6* 6*    Scheduled for tunneled dialysis catheter exchange in IR Pt is aware of procedure benefits and risks including but not limited to Infection; bleeding; vessel damage Agreeable to proceed Consent signed and in chart   Electronically Signed: Robet Leu, PA-C 03/10/2023, 9:45 AM   I spent a total of 15 minutes in face to face in clinical consultation, greater than 50% of which was counseling/coordinating care for venous access.

## 2023-03-10 NOTE — Procedures (Signed)
Pre-procedure Diagnosis: ESRD; poorly functioning HD catheter. Post-procedure Diagnosis: Same  Successful fluoro guided exchange of tunneled HD catheter with tips terminating within the superior aspect of the right atrium.    Complications: None Immediate EBL: Trace  The catheter is ready for immediate use.   Ronny Bacon, MD Pager #: [phone]

## 2023-03-10 NOTE — ED Notes (Signed)
Pharmacy tech at bedside 

## 2023-03-10 NOTE — Discharge Summary (Addendum)
Physician Discharge Summary  Rachel Villarreal ZOX:[id number] DOB: 16-Aug-1945 DOA: 03/09/2023  PCP: Rachel Villarreal  Admit date: 03/09/2023 Discharge date: 03/10/2023  Admitted From: Home Disposition:  Home  Discharge Condition:Stable CODE STATUS:FULL Diet recommendation:Renal  Brief/Interim Summary: Patient is a 77 year old female with a past medical history of end-stage renal disease on hemodialysis, hypertension, breast cancer who presented to the emergency department given she was unable to to undergo outpatient hemodialysis. The patient reports she is systemically well without any nausea or fatigue or pruritus however she has noticed as outpatient sometimes requiring 1 TDC port and 1 side of her AV fistula to be used simultaneously with significant infiltration ultimately leading to impaired dialysis efficiency and prompting her to come to the emergency department. She reports she has plan for outpatient AV fistula troubleshooting. Her last dialysis was Wednesday of this week, 03/07/2023.  On presentation, she was hemodynamically stable.  Did not appear volume overloaded.  Nephrology consulted.  IR consulted for catheter exchange.  She underwent tunneled dialysis catheter exchange by IR today .Plan for dialysis.  Medically stable for discharge after dialysis.  She will follow-up with nephrology, vascular surgery as an outpatient.  Following problems were addressed during the hospitalization:  End-stage renal disease on hemodialysis/dialysis catheter malfunction  Patient  has a right upper extremity AV fistula as well as a right IJ tunneled dialysis catheter although reports infiltration of A/V Fistula and only 1 port working [facility], last dialysis was Wednesday with reduced flow rate of note the patient's electrolytes are within normal limits, patient does have some elevated BUN but no signs of uremia currently and the patient did  not have any  volume overload, no emergent indications for  dialysis BUN 56, potassium is 4.1 and the sodium is 142, CO2 24  IR  did right IJ tunneled dialysis catheter exchange .  Nephrology was following, undegoing dialysis today. She has also an appointment with vascular surgery as an outpatient for her AV fistula   H/O breast cancer Continue outpatient follow-up, last outpatient oncology visit 11/27/2022 reviewed and she was noted to be tolerating anastrozole at that time Continue the patient's anastrozole 1 mg daily   GERD  Continue pantoprazole 40 mg daily   Hyperlipidemia  Continue atorvastatin 10 mg daily   Essential hypertension Continue the patient's amlodipine 5 mg daily as well as losartan 100 mg daily, metoprolol 50 mg extended release   Gout  Continue the patient's outpatient febuxostat 40 mg   Discharge Diagnoses:  Principal Problem:   ESRD (end stage renal disease) (HCC) Active Problems:   Gout   Essential hypertension   Hyperlipidemia   GERD (gastroesophageal reflux disease)   Breast cancer (HCC)   A-V fistula (HCC)   Insomnia    Discharge Instructions  Discharge Instructions     Diet - low sodium heart healthy   Complete by: As directed    Discharge instructions   Complete by: As directed    1)Please follow up with nephrology and vascular surgery as an outpatient.   Increase activity slowly   Complete by: As directed    No wound care   Complete by: As directed       Allergies as of 03/10/2023       Reactions   Ace Inhibitors    angioedema   Lactose Intolerance (gi)    Letrozole Hives   Lisinopril Swelling   Mercury    Red eyes, through eye drops that contained thimerosal    Tape Dermatitis  plastic        Medication List     TAKE these medications    acetaminophen 500 MG tablet Commonly known as: TYLENOL Take 500 mg by mouth every 6 (six) hours as needed for mild pain (pain score 1-3).   amLODipine 5 MG tablet Commonly known as: NORVASC Take 5 mg by mouth daily.   anastrozole 1  MG tablet Commonly known as: ARIMIDEX Take 1 tablet (1 mg total) by mouth daily.   atorvastatin 10 MG tablet Commonly known as: LIPITOR Take 10 mg by mouth daily.   budesonide 32 MCG/ACT nasal spray Commonly known as: RHINOCORT AQUA Place 2 sprays into both nostrils daily.   Cholecalciferol 50 MCG (2000 UT) Caps Take 1 capsule by mouth daily.   Claritin 10 MG tablet Generic drug: loratadine Take 10 mg by mouth in the morning and at bedtime.   febuxostat 40 MG tablet Commonly known as: ULORIC Take 40 mg by mouth daily.   furosemide 40 MG tablet Commonly known as: LASIX Take 80 mg by mouth daily.   losartan 100 MG tablet Commonly known as: COZAAR Take 100 mg by mouth daily.   melatonin 5 MG Tabs Take 1 tablet (5 mg total) by mouth at bedtime as needed.   metoprolol succinate 50 MG 24 hr tablet Commonly known as: TOPROL-XL Take 50 mg by mouth daily. At bedtime   pantoprazole 40 MG tablet Commonly known as: PROTONIX Take 40 mg by mouth daily.   PRESERVISION AREDS 2+MULTI VIT PO Take 1 capsule by mouth daily.   ProAir HFA 108 (90 Base) MCG/ACT inhaler Generic drug: albuterol Inhale 2 puffs into the lungs every 6 (six) hours as needed (for respiratory issues.).   sevelamer 800 MG tablet Commonly known as: RENAGEL Take 800 mg by mouth 3 (three) times daily with meals.        Follow-up Information     Connect with your PCP/Specialist as discussed. Schedule an appointment as soon as possible for a visit .   Contact information: [email or web address] Call our physician referral line at [phone].        Rachel Villarreal. Schedule an appointment as soon as possible for a visit in 1 week(s).   Specialty: Internal Medicine Contact information: [address] [place] [address] [phone]                Allergies  Allergen Reactions   Ace Inhibitors     angioedema   Lactose Intolerance (Gi)     Letrozole Hives   Lisinopril Swelling   Mercury     Red eyes, through eye drops that contained thimerosal    Tape Dermatitis    plastic    Consultations: IR, nephrology   Procedures/Studies: VAS US DUPLEX DIALYSIS ACCESS (AVF,AVG)  Result Date: 03/06/2023 DIALYSIS ACCESS Patient Name:  Rachel GREGORIAN Laubscher  Date of Exam:   03/06/2023 Medical Rec #: [id number]        Accession #:    [id number] Date of Birth: 02-04-1946        Patient Gender: F Patient Age:   60 years Exam Location:  [facility] Procedure:      VAS US DUPLEX DIALYSIS ACCESS (AVF, AVG) Referring Phys: Crista Elliot --------------------------------------------------------------------------------  Reason for Exam: Unable to dialyze through AVF/AVG. Access Site: Right Upper Extremity. Access Type: Brachial-cephalic AVF. History: 04/24/22 Right Brachiocephalic AVF creation by Dr Edilia Bo. Performing Technologist: Lowell Guitar RVT, RDMS  Examination Guidelines: A complete evaluation includes B-mode  imaging, spectral Doppler, color Doppler, and power Doppler as needed of all accessible portions of each vessel. Unilateral testing is considered an integral part of a complete examination. Limited examinations for reoccurring indications may be performed as noted.  Findings: +--------------------+----------+-----------------+--------+ AVF                 PSV (cm/s)Flow Vol (mL/min)Comments +--------------------+----------+-----------------+--------+ Native artery inflow   316          1275                +--------------------+----------+-----------------+--------+ AVF Anastomosis        724                              +--------------------+----------+-----------------+--------+  +------------+----------+-------------+----------+--------+ OUTFLOW VEINPSV (cm/s)Diameter (cm)Depth (cm)Describe +------------+----------+-------------+----------+--------+ Prox UA        272        0.61        0.69             +------------+----------+-------------+----------+--------+ Mid UA         474        0.66        0.54            +------------+----------+-------------+----------+--------+ Dist UA        151        0.70        0.62            +------------+----------+-------------+----------+--------+ AC Fossa       404        0.78        0.34            +------------+----------+-------------+----------+--------+   Summary: Patent arteriovenous fistula.  Elevated velocity in the anastomosis.  *See table(s) above for measurements and observations.  Diagnosing physician: Sherald Hess Villarreal Electronically signed by Sherald Hess Villarreal on 03/06/2023 at 12:06:01 PM.    --------------------------------------------------------------------------------   Final       Subjective:  Patient seen and examined the bedside today.  Hemodynamically stable.  Comfortable.  Does not appear volume overloaded.  Medically stable for discharge.  Discharge Exam: Vitals:   03/10/23 0554 03/10/23 0802  BP: (!) 152/58 (!) 156/76  Pulse: 66 68  Resp: 20 18  Temp: 98.2 F (36.8 C) (!) 97.5 F (36.4 C)  SpO2: 95% 100%   Vitals:   03/10/23 0136 03/10/23 0151 03/10/23 0554 03/10/23 0802  BP: (!) 164/67  (!) 152/58 (!) 156/76  Pulse: 73  66 68  Resp: (!) 22  20 18   Temp: 98.5 F (36.9 C)  98.2 F (36.8 C) (!) 97.5 F (36.4 C)  TempSrc: Oral  Oral Oral  SpO2: 96%  95% 100%  Weight:  63.5 kg    Height:  5\' 2"  (1.575 m)      General: Pt is alert, awake, not in acute distress Cardiovascular: RRR, S1/S2 +, no rubs, no gallops Respiratory: CTA bilaterally, no wheezing, no rhonchi Abdominal: Soft, NT, ND, bowel sounds + Extremities: no edema, no cyanosis, AV fistula on the right upper extremity, dialysis catheter on the right chest    The results of significant diagnostics from this hospitalization (including imaging, microbiology, ancillary and laboratory) are listed below for reference.      Microbiology: No results found for this or any previous visit (from the past 240 hour(s)).   Labs: BNP (last 3 results) No results for input(s): "BNP" in the last 8760 hours.  Basic Metabolic Panel: Recent Labs  Lab 03/09/23 1220 03/10/23 0225 03/10/23 0226 03/10/23 0227  NA 142 143  --  142  K 4.1 4.2  --  4.1  CL 103 107  --  108  CO2 24 22  --  22  GLUCOSE 99 73  --  73  BUN 56* 63*  --  62*  CREATININE 6.05* 6.62* 6.59* 6.32*  CALCIUM 9.2 9.0  --  8.8*  PHOS  --   --   --  9.1*   Liver Function Tests: Recent Labs  Lab 03/10/23 0227  ALBUMIN 3.0*   No results for input(s): "LIPASE", "AMYLASE" in the last 168 hours. No results for input(s): "AMMONIA" in the last 168 hours. CBC: Recent Labs  Lab 03/09/23 1220 03/10/23 0227  WBC 7.7 8.1  HGB 11.6* 11.4*  HCT 36.0 33.0*  MCV 97.6 96.2  PLT 218 202   Cardiac Enzymes: No results for input(s): "CKTOTAL", "CKMB", "CKMBINDEX", "TROPONINI" in the last 168 hours. BNP: Invalid input(s): "POCBNP" CBG: No results for input(s): "GLUCAP" in the last 168 hours. D-Dimer No results for input(s): "DDIMER" in the last 72 hours. Hgb A1c No results for input(s): "HGBA1C" in the last 72 hours. Lipid Profile No results for input(s): "CHOL", "HDL", "LDLCALC", "TRIG", "CHOLHDL", "LDLDIRECT" in the last 72 hours. Thyroid function studies No results for input(s): "TSH", "T4TOTAL", "T3FREE", "THYROIDAB" in the last 72 hours.  Invalid input(s): "FREET3" Anemia work up No results for input(s): "VITAMINB12", "FOLATE", "FERRITIN", "TIBC", "IRON", "RETICCTPCT" in the last 72 hours. Urinalysis    Component Value Date/Time   COLORURINE YELLOW 10/16/2022 1340   APPEARANCEUR CLOUDY (A) 10/16/2022 1340   LABSPEC 1.016 10/16/2022 1340   PHURINE 5.0 10/16/2022 1340   GLUCOSEU NEGATIVE 10/16/2022 1340   HGBUR SMALL (A) 10/16/2022 1340   BILIRUBINUR NEGATIVE 10/16/2022 1340   KETONESUR NEGATIVE 10/16/2022 1340   PROTEINUR >=300 (A)  10/16/2022 1340   NITRITE NEGATIVE 10/16/2022 1340   LEUKOCYTESUR MODERATE (A) 10/16/2022 1340   Sepsis Labs Recent Labs  Lab 03/09/23 1220 03/10/23 0227  WBC 7.7 8.1   Microbiology No results found for this or any previous visit (from the past 240 hour(s)).  Please note: You were cared for by a hospitalist during your hospital stay. Once you are discharged, your primary care physician will handle any further medical issues. Please note that NO REFILLS for any discharge medications will be authorized once you are discharged, as it is imperative that you return to your primary care physician (or establish a relationship with a primary care physician if you do not have one) for your post hospital discharge needs so that they can reassess your need for medications and monitor your lab values.    Time coordinating discharge: 40 minutes  SIGNED:   Burnadette Pop, Villarreal  [facility] 03/10/2023, 11:49 AM Pager [phone]  If 7PM-7AM, please contact night-coverage [email or web address] Password [email or web address]

## 2023-03-11 NOTE — Discharge Planning (Signed)
[facility] Patient Discharge Orders- [facility] CLINIC: [place]  Patient's name: Rachel Villarreal Admit/DC Dates: 03/09/2023 - 03/10/2023 Observation  Discharge Diagnoses: Catheter malfunction  s/p TDC exchange  Recent Labs  Lab 03/10/23 0227  HGB 11.4*  K 4.1  CALCIUM 8.8*  PHOS 9.1*  ALBUMIN 3.0*    Aranesp: Given: --   Date of last dose/amount: --   PRBC's Given: -- Date/# of units: -- ESA dose for discharge: No change   Outpatient Dialysis Orders:  -Heparin:  No change -EDW: No change  -[place]: No change  Access intervention/Change:  TDC exchange per IR 03/10/23   IV Antibiotics: --   OTHER/APPTS/LABS    Completed by: Tomasa Blase PA-C   D/C Meds to be reconciled by nurse after every discharge.    Reviewed by: MD:______ RN_______

## 2023-03-12 ENCOUNTER — Encounter (HOSPITAL_COMMUNITY): Payer: Self-pay | Admitting: Radiology

## 2023-03-12 ENCOUNTER — Other Ambulatory Visit: Payer: Self-pay

## 2023-03-12 DIAGNOSIS — N186 End stage renal disease: Secondary | ICD-10-CM

## 2023-03-12 LAB — HEPATITIS B SURFACE ANTIBODY, QUANTITATIVE: Hep B S AB Quant (Post): 360 m[IU]/mL

## 2023-03-12 MED ORDER — SODIUM CHLORIDE 0.9% FLUSH: 3.0000 mL | INTRAVENOUS | Status: DC | PRN

## 2023-03-12 MED ORDER — SODIUM CHLORIDE 0.9 % IV SOLN: 250.0000 mL | INTRAVENOUS | Status: AC | PRN

## 2023-03-13 DIAGNOSIS — N2581 Secondary hyperparathyroidism of renal origin: Secondary | ICD-10-CM | POA: Diagnosis not present

## 2023-03-13 DIAGNOSIS — N186 End stage renal disease: Secondary | ICD-10-CM | POA: Diagnosis not present

## 2023-03-13 DIAGNOSIS — Z23 Encounter for immunization: Secondary | ICD-10-CM | POA: Diagnosis not present

## 2023-03-13 DIAGNOSIS — Z992 Dependence on renal dialysis: Secondary | ICD-10-CM | POA: Diagnosis not present

## 2023-03-13 DIAGNOSIS — D689 Coagulation defect, unspecified: Secondary | ICD-10-CM | POA: Diagnosis not present

## 2023-03-13 DIAGNOSIS — Z4901 Encounter for fitting and adjustment of extracorporeal dialysis catheter: Secondary | ICD-10-CM | POA: Diagnosis not present

## 2023-03-15 DIAGNOSIS — N186 End stage renal disease: Secondary | ICD-10-CM | POA: Diagnosis not present

## 2023-03-15 DIAGNOSIS — N2581 Secondary hyperparathyroidism of renal origin: Secondary | ICD-10-CM | POA: Diagnosis not present

## 2023-03-15 DIAGNOSIS — Z4901 Encounter for fitting and adjustment of extracorporeal dialysis catheter: Secondary | ICD-10-CM | POA: Diagnosis not present

## 2023-03-15 DIAGNOSIS — D689 Coagulation defect, unspecified: Secondary | ICD-10-CM | POA: Diagnosis not present

## 2023-03-15 DIAGNOSIS — Z992 Dependence on renal dialysis: Secondary | ICD-10-CM | POA: Diagnosis not present

## 2023-03-15 DIAGNOSIS — Z23 Encounter for immunization: Secondary | ICD-10-CM | POA: Diagnosis not present

## 2023-03-17 DIAGNOSIS — Z992 Dependence on renal dialysis: Secondary | ICD-10-CM | POA: Diagnosis not present

## 2023-03-17 DIAGNOSIS — Z4901 Encounter for fitting and adjustment of extracorporeal dialysis catheter: Secondary | ICD-10-CM | POA: Diagnosis not present

## 2023-03-17 DIAGNOSIS — N186 End stage renal disease: Secondary | ICD-10-CM | POA: Diagnosis not present

## 2023-03-17 DIAGNOSIS — Z23 Encounter for immunization: Secondary | ICD-10-CM | POA: Diagnosis not present

## 2023-03-17 DIAGNOSIS — D689 Coagulation defect, unspecified: Secondary | ICD-10-CM | POA: Diagnosis not present

## 2023-03-17 DIAGNOSIS — N2581 Secondary hyperparathyroidism of renal origin: Secondary | ICD-10-CM | POA: Diagnosis not present

## 2023-03-19 ENCOUNTER — Other Ambulatory Visit: Payer: Self-pay | Admitting: *Deleted

## 2023-03-19 ENCOUNTER — Other Ambulatory Visit: Payer: Self-pay

## 2023-03-19 ENCOUNTER — Ambulatory Visit (HOSPITAL_COMMUNITY)
Admission: RE | Admit: 2023-03-19 | Discharge: 2023-03-19 | Disposition: A | Discharge status: Home / Self Care | Payer: Medicare Other | Attending: Vascular Surgery | Admitting: Vascular Surgery

## 2023-03-19 ENCOUNTER — Encounter (HOSPITAL_COMMUNITY): Payer: Self-pay | Admitting: Vascular Surgery

## 2023-03-19 ENCOUNTER — Ambulatory Visit (HOSPITAL_COMMUNITY)
Admission: RE | Disposition: A | Discharge status: Home / Self Care | Payer: Self-pay | Source: Home / Self Care | Attending: Vascular Surgery

## 2023-03-19 DIAGNOSIS — N186 End stage renal disease: Secondary | ICD-10-CM | POA: Diagnosis not present

## 2023-03-19 DIAGNOSIS — Z992 Dependence on renal dialysis: Secondary | ICD-10-CM | POA: Diagnosis not present

## 2023-03-19 DIAGNOSIS — T82898A Other specified complication of vascular prosthetic devices, implants and grafts, initial encounter: Secondary | ICD-10-CM

## 2023-03-19 DIAGNOSIS — T82590A Other mechanical complication of surgically created arteriovenous fistula, initial encounter: Secondary | ICD-10-CM | POA: Diagnosis not present

## 2023-03-19 DIAGNOSIS — Y832 Surgical operation with anastomosis, bypass or graft as the cause of abnormal reaction of the patient, or of later complication, without mention of misadventure at the time of the procedure: Secondary | ICD-10-CM | POA: Diagnosis not present

## 2023-03-19 HISTORY — PX: A/V FISTULAGRAM: CATH118298

## 2023-03-19 LAB — POCT I-STAT, CHEM 8
BUN: 38 mg/dL — ABNORMAL HIGH (ref 8–23)
Calcium, Ion: 1.24 mmol/L (ref 1.15–1.40)
Chloride: 105 mmol/L (ref 98–111)
Creatinine, Ser: 5.1 mg/dL — ABNORMAL HIGH (ref 0.44–1.00)
Glucose, Bld: 88 mg/dL (ref 70–99)
HCT: 32 % — ABNORMAL LOW (ref 36.0–46.0)
Hemoglobin: 10.9 g/dL — ABNORMAL LOW (ref 12.0–15.0)
Potassium: 3.6 mmol/L (ref 3.5–5.1)
Sodium: 141 mmol/L (ref 135–145)
TCO2: 25 mmol/L (ref 22–32)

## 2023-03-19 SURGERY — A/V FISTULAGRAM
Anesthesia: LOCAL | Laterality: Right

## 2023-03-19 MED ORDER — MIDAZOLAM HCL 2 MG/2ML IJ SOLN
INTRAMUSCULAR | Status: DC | PRN
  Administered 2023-03-19: 1 mg via INTRAVENOUS

## 2023-03-19 MED ORDER — FENTANYL CITRATE (PF) 100 MCG/2ML IJ SOLN
INTRAMUSCULAR | Status: AC
  Filled 2023-03-19: qty 2

## 2023-03-19 MED ORDER — FENTANYL CITRATE (PF) 100 MCG/2ML IJ SOLN
INTRAMUSCULAR | Status: DC | PRN
  Administered 2023-03-19: 25 ug via INTRAVENOUS

## 2023-03-19 MED ORDER — MIDAZOLAM HCL 2 MG/2ML IJ SOLN
INTRAMUSCULAR | Status: AC
  Filled 2023-03-19: qty 2

## 2023-03-19 MED ORDER — HEPARIN (PORCINE) IN NACL 1000-0.9 UT/500ML-% IV SOLN
INTRAVENOUS | Status: DC | PRN
  Administered 2023-03-19: 500 mL

## 2023-03-19 MED ORDER — IODIXANOL 320 MG/ML IV SOLN
INTRAVENOUS | Status: DC | PRN
  Administered 2023-03-19: 25 mL via INTRAVENOUS

## 2023-03-19 MED ORDER — LIDOCAINE HCL (PF) 1 % IJ SOLN
INTRAMUSCULAR | Status: AC
  Filled 2023-03-19: qty 30

## 2023-03-19 MED ORDER — SODIUM CHLORIDE 0.9% FLUSH: 3.0000 mL | Freq: Two times a day (BID) | INTRAVENOUS | Status: DC

## 2023-03-19 SURGICAL SUPPLY — 9 items
BAG SNAP BAND KOVER 36X36 (MISCELLANEOUS) ×1 IMPLANT
COVER DOME SNAP 22 D (MISCELLANEOUS) ×1 IMPLANT
KIT MICROPUNCTURE NIT STIFF (SHEATH) IMPLANT
KIT SYRINGE INJ CVI SPIKEX1 (MISCELLANEOUS) IMPLANT
SET ATX-X65L (MISCELLANEOUS) IMPLANT
SHEATH PROBE COVER 6X72 (BAG) ×1 IMPLANT
STOPCOCK MORSE 400PSI 3WAY (MISCELLANEOUS) ×1 IMPLANT
TRAY PV CATH (CUSTOM PROCEDURE TRAY) ×1 IMPLANT
TUBING CIL FLEX 10 FLL-RA (TUBING) ×1 IMPLANT

## 2023-03-19 NOTE — Op Note (Signed)
    Patient name: Rachel Villarreal MRN: [id number] DOB: [birth year] Sex: female  03/19/2023 Pre-operative Diagnosis: End-stage renal disease, malfunction right arm AV fistula Post-operative diagnosis:  Same Surgeon:  Luanna Salk. Randie Heinz, MD Procedure Performed: 1.  Ultrasound-guided cannulation right arm AV fistula 2.  Right upper extremity fistulogram 3.  Moderate sedation with fentanyl and Versed for 10 minutes  Indications: 77 year old female with end-stage renal disease currently dialyzing via catheter.  She has a fistula in her right upper extremity which is now 57 months old but has been unable to be cannulated without infiltration.  She is now indicated for fistulogram with possible intervention.  Findings: Fistula is very large near the antecubitum and remained sizable however was much deeper in the upper arm with several branches and is relatively serpentine.  The plan will be to proceed with superficialization versus transposition on a nondialysis day after the holidays.   Procedure:  The patient was identified in the holding area and taken to room 8.  The patient was then placed supine on the table and prepped and draped in the usual sterile fashion.  A time out was called.  Ultrasound was used to evaluate the right arm AV fistula.  She was administered moderate sedation with fentanyl and Versed her vital signs were monitored throughout the case.  The area around the fistula was then anesthetized under ultrasound guidance and this was cannulated with a micropuncture needle followed by wire and a sheath.  Right upper extremity fistulogram was performed including retrograde imaging with compression.  With the above findings we removed the micropuncture sheath and suture-ligated the cannulation site.  She tolerated the procedure well without immediate complication.  Contrast: 10 cc     Ahmon Tosi C. Randie Heinz, MD [facility] Office: [phone] Pager:  [phone]

## 2023-03-19 NOTE — Progress Notes (Signed)
Patient was given discharge instructions. She verbalized understanding. 

## 2023-03-19 NOTE — Interval H&P Note (Signed)
History and Physical Interval Note:  03/19/2023 7:16 AM  Rachel Villarreal  has presented today for surgery, with the diagnosis of instage renal.  The various methods of treatment have been discussed with the patient and family. After consideration of risks, benefits and other options for treatment, the patient has consented to  Procedure(s): A/V Fistulagram (Right) as a surgical intervention.  The patient's history has been reviewed, patient examined, no change in status, stable for surgery.  I have reviewed the patient's chart and labs.  Questions were answered to the patient's satisfaction.     [name]

## 2023-03-20 DIAGNOSIS — N186 End stage renal disease: Secondary | ICD-10-CM | POA: Diagnosis not present

## 2023-03-20 DIAGNOSIS — N2581 Secondary hyperparathyroidism of renal origin: Secondary | ICD-10-CM | POA: Diagnosis not present

## 2023-03-20 DIAGNOSIS — Z4901 Encounter for fitting and adjustment of extracorporeal dialysis catheter: Secondary | ICD-10-CM | POA: Diagnosis not present

## 2023-03-20 DIAGNOSIS — Z992 Dependence on renal dialysis: Secondary | ICD-10-CM | POA: Diagnosis not present

## 2023-03-20 DIAGNOSIS — D689 Coagulation defect, unspecified: Secondary | ICD-10-CM | POA: Diagnosis not present

## 2023-03-20 DIAGNOSIS — Z23 Encounter for immunization: Secondary | ICD-10-CM | POA: Diagnosis not present

## 2023-03-20 MED FILL — Lidocaine HCl Local Preservative Free (PF) Inj 1%: INTRAMUSCULAR | Qty: 30 | Status: AC

## 2023-03-22 DIAGNOSIS — Z992 Dependence on renal dialysis: Secondary | ICD-10-CM | POA: Diagnosis not present

## 2023-03-22 DIAGNOSIS — D689 Coagulation defect, unspecified: Secondary | ICD-10-CM | POA: Diagnosis not present

## 2023-03-22 DIAGNOSIS — N186 End stage renal disease: Secondary | ICD-10-CM | POA: Diagnosis not present

## 2023-03-22 DIAGNOSIS — N2581 Secondary hyperparathyroidism of renal origin: Secondary | ICD-10-CM | POA: Diagnosis not present

## 2023-03-22 DIAGNOSIS — Z4901 Encounter for fitting and adjustment of extracorporeal dialysis catheter: Secondary | ICD-10-CM | POA: Diagnosis not present

## 2023-03-22 DIAGNOSIS — Z23 Encounter for immunization: Secondary | ICD-10-CM | POA: Diagnosis not present

## 2023-03-24 DIAGNOSIS — Z4901 Encounter for fitting and adjustment of extracorporeal dialysis catheter: Secondary | ICD-10-CM | POA: Diagnosis not present

## 2023-03-24 DIAGNOSIS — N186 End stage renal disease: Secondary | ICD-10-CM | POA: Diagnosis not present

## 2023-03-24 DIAGNOSIS — N2581 Secondary hyperparathyroidism of renal origin: Secondary | ICD-10-CM | POA: Diagnosis not present

## 2023-03-24 DIAGNOSIS — D689 Coagulation defect, unspecified: Secondary | ICD-10-CM | POA: Diagnosis not present

## 2023-03-24 DIAGNOSIS — Z992 Dependence on renal dialysis: Secondary | ICD-10-CM | POA: Diagnosis not present

## 2023-03-24 DIAGNOSIS — Z23 Encounter for immunization: Secondary | ICD-10-CM | POA: Diagnosis not present

## 2023-03-27 ENCOUNTER — Other Ambulatory Visit: Payer: Self-pay | Admitting: Internal Medicine

## 2023-03-27 DIAGNOSIS — Z23 Encounter for immunization: Secondary | ICD-10-CM | POA: Diagnosis not present

## 2023-03-27 DIAGNOSIS — M109 Gout, unspecified: Secondary | ICD-10-CM | POA: Diagnosis not present

## 2023-03-27 DIAGNOSIS — Z4901 Encounter for fitting and adjustment of extracorporeal dialysis catheter: Secondary | ICD-10-CM | POA: Diagnosis not present

## 2023-03-27 DIAGNOSIS — D689 Coagulation defect, unspecified: Secondary | ICD-10-CM | POA: Diagnosis not present

## 2023-03-27 DIAGNOSIS — G4700 Insomnia, unspecified: Secondary | ICD-10-CM | POA: Diagnosis not present

## 2023-03-27 DIAGNOSIS — I120 Hypertensive chronic kidney disease with stage 5 chronic kidney disease or end stage renal disease: Secondary | ICD-10-CM | POA: Diagnosis not present

## 2023-03-27 DIAGNOSIS — N2581 Secondary hyperparathyroidism of renal origin: Secondary | ICD-10-CM | POA: Diagnosis not present

## 2023-03-27 DIAGNOSIS — Z1231 Encounter for screening mammogram for malignant neoplasm of breast: Secondary | ICD-10-CM

## 2023-03-27 DIAGNOSIS — E7800 Pure hypercholesterolemia, unspecified: Secondary | ICD-10-CM | POA: Diagnosis not present

## 2023-03-27 DIAGNOSIS — Z0000 Encounter for general adult medical examination without abnormal findings: Secondary | ICD-10-CM | POA: Diagnosis not present

## 2023-03-27 DIAGNOSIS — N186 End stage renal disease: Secondary | ICD-10-CM | POA: Diagnosis not present

## 2023-03-27 DIAGNOSIS — I700 Atherosclerosis of aorta: Secondary | ICD-10-CM | POA: Diagnosis not present

## 2023-03-27 DIAGNOSIS — C50912 Malignant neoplasm of unspecified site of left female breast: Secondary | ICD-10-CM | POA: Diagnosis not present

## 2023-03-27 DIAGNOSIS — D8481 Immunodeficiency due to conditions classified elsewhere: Secondary | ICD-10-CM | POA: Diagnosis not present

## 2023-03-27 DIAGNOSIS — Z992 Dependence on renal dialysis: Secondary | ICD-10-CM | POA: Diagnosis not present

## 2023-03-29 DIAGNOSIS — Z992 Dependence on renal dialysis: Secondary | ICD-10-CM | POA: Diagnosis not present

## 2023-03-29 DIAGNOSIS — N186 End stage renal disease: Secondary | ICD-10-CM | POA: Diagnosis not present

## 2023-03-29 DIAGNOSIS — Z4901 Encounter for fitting and adjustment of extracorporeal dialysis catheter: Secondary | ICD-10-CM | POA: Diagnosis not present

## 2023-03-29 DIAGNOSIS — N2581 Secondary hyperparathyroidism of renal origin: Secondary | ICD-10-CM | POA: Diagnosis not present

## 2023-03-29 DIAGNOSIS — D689 Coagulation defect, unspecified: Secondary | ICD-10-CM | POA: Diagnosis not present

## 2023-03-29 DIAGNOSIS — Z23 Encounter for immunization: Secondary | ICD-10-CM | POA: Diagnosis not present

## 2023-03-31 DIAGNOSIS — N186 End stage renal disease: Secondary | ICD-10-CM | POA: Diagnosis not present

## 2023-03-31 DIAGNOSIS — Z23 Encounter for immunization: Secondary | ICD-10-CM | POA: Diagnosis not present

## 2023-03-31 DIAGNOSIS — D689 Coagulation defect, unspecified: Secondary | ICD-10-CM | POA: Diagnosis not present

## 2023-03-31 DIAGNOSIS — N2581 Secondary hyperparathyroidism of renal origin: Secondary | ICD-10-CM | POA: Diagnosis not present

## 2023-03-31 DIAGNOSIS — Z4901 Encounter for fitting and adjustment of extracorporeal dialysis catheter: Secondary | ICD-10-CM | POA: Diagnosis not present

## 2023-03-31 DIAGNOSIS — Z992 Dependence on renal dialysis: Secondary | ICD-10-CM | POA: Diagnosis not present

## 2023-04-02 DIAGNOSIS — Z992 Dependence on renal dialysis: Secondary | ICD-10-CM | POA: Diagnosis not present

## 2023-04-02 DIAGNOSIS — N186 End stage renal disease: Secondary | ICD-10-CM | POA: Diagnosis not present

## 2023-04-02 DIAGNOSIS — D689 Coagulation defect, unspecified: Secondary | ICD-10-CM | POA: Diagnosis not present

## 2023-04-02 DIAGNOSIS — Z23 Encounter for immunization: Secondary | ICD-10-CM | POA: Diagnosis not present

## 2023-04-02 DIAGNOSIS — N2581 Secondary hyperparathyroidism of renal origin: Secondary | ICD-10-CM | POA: Diagnosis not present

## 2023-04-02 DIAGNOSIS — Z4901 Encounter for fitting and adjustment of extracorporeal dialysis catheter: Secondary | ICD-10-CM | POA: Diagnosis not present

## 2023-04-05 DIAGNOSIS — N2581 Secondary hyperparathyroidism of renal origin: Secondary | ICD-10-CM | POA: Diagnosis not present

## 2023-04-05 DIAGNOSIS — Z4901 Encounter for fitting and adjustment of extracorporeal dialysis catheter: Secondary | ICD-10-CM | POA: Diagnosis not present

## 2023-04-05 DIAGNOSIS — N186 End stage renal disease: Secondary | ICD-10-CM | POA: Diagnosis not present

## 2023-04-05 DIAGNOSIS — Z23 Encounter for immunization: Secondary | ICD-10-CM | POA: Diagnosis not present

## 2023-04-05 DIAGNOSIS — D689 Coagulation defect, unspecified: Secondary | ICD-10-CM | POA: Diagnosis not present

## 2023-04-05 DIAGNOSIS — Z992 Dependence on renal dialysis: Secondary | ICD-10-CM | POA: Diagnosis not present

## 2023-04-07 DIAGNOSIS — Z992 Dependence on renal dialysis: Secondary | ICD-10-CM | POA: Diagnosis not present

## 2023-04-07 DIAGNOSIS — Z4901 Encounter for fitting and adjustment of extracorporeal dialysis catheter: Secondary | ICD-10-CM | POA: Diagnosis not present

## 2023-04-07 DIAGNOSIS — N186 End stage renal disease: Secondary | ICD-10-CM | POA: Diagnosis not present

## 2023-04-07 DIAGNOSIS — N2581 Secondary hyperparathyroidism of renal origin: Secondary | ICD-10-CM | POA: Diagnosis not present

## 2023-04-07 DIAGNOSIS — D689 Coagulation defect, unspecified: Secondary | ICD-10-CM | POA: Diagnosis not present

## 2023-04-09 ENCOUNTER — Telehealth: Payer: Self-pay | Admitting: Hematology and Oncology

## 2023-04-09 NOTE — Telephone Encounter (Signed)
Patient is aware of rescheduled appointment times/dates 

## 2023-04-10 ENCOUNTER — Other Ambulatory Visit: Payer: Self-pay

## 2023-04-10 ENCOUNTER — Encounter (HOSPITAL_COMMUNITY): Payer: Self-pay | Admitting: Vascular Surgery

## 2023-04-10 DIAGNOSIS — N186 End stage renal disease: Secondary | ICD-10-CM | POA: Diagnosis not present

## 2023-04-10 DIAGNOSIS — I120 Hypertensive chronic kidney disease with stage 5 chronic kidney disease or end stage renal disease: Secondary | ICD-10-CM | POA: Diagnosis not present

## 2023-04-10 DIAGNOSIS — Z992 Dependence on renal dialysis: Secondary | ICD-10-CM | POA: Diagnosis not present

## 2023-04-10 NOTE — Progress Notes (Signed)
 PCP - Ransom Other, MD Cardiologist - Denies  EKG - 03/12/2023 Chest x-ray - 08/23/2022 ECHO - Denies Cardiac Cath - Denies  Sleep Study- Denies  DM- Denies  Blood Thinner Instructions: Denies Aspirin  Instructions: Denies  ERAS Protcol - N/A.  NPO ordered COVID TEST- N/A  Anesthesia review: Yes  -------------  SDW INSTRUCTIONS:  Your procedure is scheduled on Friday Jan 3. Please report to [facility] Main Entrance A at 0715 A.M., and check in at the Admitting office. Call this number if you have problems the morning of surgery: [phone]   Remember: Do not eat or drink after midnight the night before your surgery  Medications to take morning of surgery with a sip of water include: amLODipine  (NORVASC )  anastrozole  (ARIMIDEX )  atorvastatin  (LIPITOR )  budesonide  (RHINOCORT  AQUA) 32 MCG/ACT nasal spray  loratadine  (CLARITIN )  pantoprazole  (PROTONIX )   IF NEEDED acetaminophen  (TYLENOL )  PROAIR  HFA 108 (90 BASE) .  Please bring on DOS   As of today, STOP taking any Aspirin  (unless otherwise instructed by your surgeon), Aleve, Naproxen, Ibuprofen, Motrin, Advil, Goody's, BC's, all herbal medications, fish oil, and all vitamins. HOLD diclofenac  Sodium (VOLTAREN ) 1 % GEL     The Morning of Surgery Do not wear jewelry, make-up or nail polish. Do not wear lotions, powders, or perfumes/colognes, or deodorant Do not bring valuables to the hospital. [facility] is not responsible for any belongings or valuables.  If you are a smoker, DO NOT Smoke 24 hours prior to surgery  If you wear a CPAP at night please bring your mask the morning of surgery   Remember that you must have someone to transport you home after your surgery, and remain with you for 24 hours if you are discharged the same day.  Please bring cases for contacts, glasses, hearing aids, dentures or bridgework because it cannot be worn into surgery.   Patients discharged the day of surgery will not be  allowed to drive home.   Please shower the NIGHT BEFORE/MORNING OF SURGERY (use antibacterial soap like DIAL  soap if possible). Wear comfortable clothes the morning of surgery. Oral Hygiene is also important to reduce your risk of infection.  Remember - BRUSH YOUR TEETH THE MORNING OF SURGERY WITH YOUR REGULAR TOOTHPASTE  Patient denies shortness of breath, fever, cough and chest pain.

## 2023-04-12 ENCOUNTER — Other Ambulatory Visit: Payer: Self-pay | Admitting: Internal Medicine

## 2023-04-12 DIAGNOSIS — N186 End stage renal disease: Secondary | ICD-10-CM | POA: Diagnosis not present

## 2023-04-12 DIAGNOSIS — Z1231 Encounter for screening mammogram for malignant neoplasm of breast: Secondary | ICD-10-CM

## 2023-04-12 DIAGNOSIS — D689 Coagulation defect, unspecified: Secondary | ICD-10-CM | POA: Diagnosis not present

## 2023-04-12 DIAGNOSIS — Z992 Dependence on renal dialysis: Secondary | ICD-10-CM | POA: Diagnosis not present

## 2023-04-12 DIAGNOSIS — N2581 Secondary hyperparathyroidism of renal origin: Secondary | ICD-10-CM | POA: Diagnosis not present

## 2023-04-12 DIAGNOSIS — D509 Iron deficiency anemia, unspecified: Secondary | ICD-10-CM | POA: Diagnosis not present

## 2023-04-12 DIAGNOSIS — Z4901 Encounter for fitting and adjustment of extracorporeal dialysis catheter: Secondary | ICD-10-CM | POA: Diagnosis not present

## 2023-04-12 DIAGNOSIS — Z9889 Other specified postprocedural states: Secondary | ICD-10-CM

## 2023-04-12 DIAGNOSIS — Z853 Personal history of malignant neoplasm of breast: Secondary | ICD-10-CM

## 2023-04-12 DIAGNOSIS — R52 Pain, unspecified: Secondary | ICD-10-CM | POA: Diagnosis not present

## 2023-04-12 NOTE — Progress Notes (Signed)
 Patient called stating she has a runny nose and post nasal drip.  She stated she took a covid test at home that was negative.  She states she does not feel bad and is currently on her way to dialysis.  Dr. Sheree was made aware and does not feel like her surgery should be cancelled.  Patient was told to let us  know if her symptoms worsened or she started running a fever.

## 2023-04-13 ENCOUNTER — Other Ambulatory Visit: Payer: Self-pay

## 2023-04-13 ENCOUNTER — Ambulatory Visit (HOSPITAL_COMMUNITY): Payer: Medicare Other | Admitting: Physician Assistant

## 2023-04-13 ENCOUNTER — Encounter (HOSPITAL_COMMUNITY)
Admission: RE | Disposition: A | Discharge status: Home / Self Care | Payer: Self-pay | Source: Home / Self Care | Attending: Vascular Surgery

## 2023-04-13 ENCOUNTER — Encounter (HOSPITAL_COMMUNITY): Payer: Self-pay | Admitting: Vascular Surgery

## 2023-04-13 ENCOUNTER — Ambulatory Visit (HOSPITAL_BASED_OUTPATIENT_CLINIC_OR_DEPARTMENT_OTHER)
Admission: RE | Admit: 2023-04-13 | Discharge: 2023-04-13 | Disposition: A | Discharge status: Home / Self Care | Payer: Medicare Other | Source: Home / Self Care | Attending: Vascular Surgery | Admitting: Vascular Surgery

## 2023-04-13 DIAGNOSIS — T82898A Other specified complication of vascular prosthetic devices, implants and grafts, initial encounter: Secondary | ICD-10-CM | POA: Diagnosis not present

## 2023-04-13 DIAGNOSIS — I120 Hypertensive chronic kidney disease with stage 5 chronic kidney disease or end stage renal disease: Secondary | ICD-10-CM | POA: Insufficient documentation

## 2023-04-13 DIAGNOSIS — Z87891 Personal history of nicotine dependence: Secondary | ICD-10-CM | POA: Insufficient documentation

## 2023-04-13 DIAGNOSIS — Z992 Dependence on renal dialysis: Secondary | ICD-10-CM | POA: Insufficient documentation

## 2023-04-13 DIAGNOSIS — J101 Influenza due to other identified influenza virus with other respiratory manifestations: Secondary | ICD-10-CM | POA: Diagnosis not present

## 2023-04-13 DIAGNOSIS — I132 Hypertensive heart and chronic kidney disease with heart failure and with stage 5 chronic kidney disease, or end stage renal disease: Secondary | ICD-10-CM | POA: Diagnosis not present

## 2023-04-13 DIAGNOSIS — K219 Gastro-esophageal reflux disease without esophagitis: Secondary | ICD-10-CM | POA: Insufficient documentation

## 2023-04-13 DIAGNOSIS — E785 Hyperlipidemia, unspecified: Secondary | ICD-10-CM | POA: Diagnosis not present

## 2023-04-13 DIAGNOSIS — N186 End stage renal disease: Secondary | ICD-10-CM | POA: Insufficient documentation

## 2023-04-13 HISTORY — PX: FISTULA SUPERFICIALIZATION: SHX6341

## 2023-04-13 LAB — POCT I-STAT, CHEM 8
BUN: 23 mg/dL (ref 8–23)
Calcium, Ion: 1.19 mmol/L (ref 1.15–1.40)
Chloride: 102 mmol/L (ref 98–111)
Creatinine, Ser: 4.1 mg/dL — ABNORMAL HIGH (ref 0.44–1.00)
Glucose, Bld: 97 mg/dL (ref 70–99)
HCT: 29 % — ABNORMAL LOW (ref 36.0–46.0)
Hemoglobin: 9.9 g/dL — ABNORMAL LOW (ref 12.0–15.0)
Potassium: 3.8 mmol/L (ref 3.5–5.1)
Sodium: 140 mmol/L (ref 135–145)
TCO2: 26 mmol/L (ref 22–32)

## 2023-04-13 SURGERY — FISTULA SUPERFICIALIZATION
Anesthesia: General | Laterality: Right

## 2023-04-13 MED ORDER — CHLORHEXIDINE GLUCONATE 0.12 % MT SOLN
OROMUCOSAL | Status: AC
  Administered 2023-04-13: 15 mL
  Filled 2023-04-13: qty 15

## 2023-04-13 MED ORDER — ACETAMINOPHEN 500 MG PO TABS
1000.0000 mg | ORAL_TABLET | Freq: Once | ORAL | Status: AC
Start: 2023-04-13 — End: 2023-04-13

## 2023-04-13 MED ORDER — ONDANSETRON HCL 4 MG/2ML IJ SOLN
INTRAMUSCULAR | Status: AC
  Filled 2023-04-13: qty 2

## 2023-04-13 MED ORDER — 0.9 % SODIUM CHLORIDE (POUR BTL) OPTIME
TOPICAL | Status: DC | PRN
  Administered 2023-04-13: 1000 mL

## 2023-04-13 MED ORDER — PROPOFOL 10 MG/ML IV BOLUS
INTRAVENOUS | Status: DC | PRN
  Administered 2023-04-13: 120 mg via INTRAVENOUS

## 2023-04-13 MED ORDER — ACETAMINOPHEN 500 MG PO TABS
ORAL_TABLET | ORAL | Status: AC
  Administered 2023-04-13: 1000 mg via ORAL
  Filled 2023-04-13: qty 2

## 2023-04-13 MED ORDER — OXYCODONE-ACETAMINOPHEN 5-325 MG PO TABS: 1.0000 | ORAL_TABLET | Freq: Four times a day (QID) | ORAL | 0 refills | Status: DC | PRN

## 2023-04-13 MED ORDER — DEXAMETHASONE SODIUM PHOSPHATE 10 MG/ML IJ SOLN
INTRAMUSCULAR | Status: DC | PRN
  Administered 2023-04-13: 5 mg via INTRAVENOUS

## 2023-04-13 MED ORDER — FENTANYL CITRATE (PF) 250 MCG/5ML IJ SOLN
INTRAMUSCULAR | Status: AC
  Filled 2023-04-13: qty 5

## 2023-04-13 MED ORDER — FENTANYL CITRATE (PF) 100 MCG/2ML IJ SOLN: 25.0000 ug | INTRAMUSCULAR | Status: DC | PRN

## 2023-04-13 MED ORDER — PHENYLEPHRINE HCL-NACL 20-0.9 MG/250ML-% IV SOLN
INTRAVENOUS | Status: DC | PRN
  Administered 2023-04-13: 20 ug/min via INTRAVENOUS

## 2023-04-13 MED ORDER — SODIUM CHLORIDE 0.9 % IV SOLN: INTRAVENOUS | Status: DC

## 2023-04-13 MED ORDER — CHLORHEXIDINE GLUCONATE 4 % EX SOLN: 60.0000 mL | Freq: Once | CUTANEOUS | Status: DC

## 2023-04-13 MED ORDER — CEFAZOLIN SODIUM-DEXTROSE 2-4 GM/100ML-% IV SOLN
2.0000 g | INTRAVENOUS | Status: AC
  Administered 2023-04-13: 2 g via INTRAVENOUS

## 2023-04-13 MED ORDER — FENTANYL CITRATE (PF) 250 MCG/5ML IJ SOLN
INTRAMUSCULAR | Status: DC | PRN
  Administered 2023-04-13 (×2): 50 ug via INTRAVENOUS

## 2023-04-13 MED ORDER — PROPOFOL 10 MG/ML IV BOLUS
INTRAVENOUS | Status: AC
  Filled 2023-04-13: qty 20

## 2023-04-13 MED ORDER — ONDANSETRON HCL 4 MG/2ML IJ SOLN: 4.0000 mg | Freq: Once | INTRAMUSCULAR | Status: DC | PRN

## 2023-04-13 MED ORDER — OXYCODONE HCL 5 MG PO TABS
5.0000 mg | ORAL_TABLET | Freq: Once | ORAL | Status: DC | PRN
Start: 2023-04-13 — End: 2023-04-13

## 2023-04-13 MED ORDER — HEPARIN 6000 UNIT IRRIGATION SOLUTION
Status: DC | PRN
  Administered 2023-04-13: 1

## 2023-04-13 MED ORDER — ONDANSETRON HCL 4 MG/2ML IJ SOLN
INTRAMUSCULAR | Status: DC | PRN
  Administered 2023-04-13: 4 mg via INTRAVENOUS

## 2023-04-13 MED ORDER — HEPARIN 6000 UNIT IRRIGATION SOLUTION
Status: AC
  Filled 2023-04-13: qty 500

## 2023-04-13 MED ORDER — OXYCODONE HCL 5 MG/5ML PO SOLN: 5.0000 mg | Freq: Once | ORAL | Status: DC | PRN

## 2023-04-13 MED ORDER — LIDOCAINE 2% (20 MG/ML) 5 ML SYRINGE
INTRAMUSCULAR | Status: DC | PRN
  Administered 2023-04-13: 60 mg via INTRAVENOUS

## 2023-04-13 MED ORDER — PHENYLEPHRINE 80 MCG/ML (10ML) SYRINGE FOR IV PUSH (FOR BLOOD PRESSURE SUPPORT)
PREFILLED_SYRINGE | INTRAVENOUS | Status: DC | PRN
  Administered 2023-04-13: 160 ug via INTRAVENOUS
  Administered 2023-04-13: 80 ug via INTRAVENOUS
  Administered 2023-04-13: 160 ug via INTRAVENOUS

## 2023-04-13 MED ORDER — DEXAMETHASONE SODIUM PHOSPHATE 10 MG/ML IJ SOLN
INTRAMUSCULAR | Status: AC
  Filled 2023-04-13: qty 1

## 2023-04-13 MED ORDER — CEFAZOLIN SODIUM-DEXTROSE 2-4 GM/100ML-% IV SOLN
INTRAVENOUS | Status: AC
  Filled 2023-04-13: qty 100

## 2023-04-13 MED ORDER — LIDOCAINE 2% (20 MG/ML) 5 ML SYRINGE
INTRAMUSCULAR | Status: AC
  Filled 2023-04-13: qty 5

## 2023-04-13 SURGICAL SUPPLY — 27 items
ARMBAND PINK RESTRICT EXTREMIT (MISCELLANEOUS) ×1 IMPLANT
BAG COUNTER SPONGE SURGICOUNT (BAG) ×1 IMPLANT
CANISTER SUCT 3000ML PPV (MISCELLANEOUS) ×1 IMPLANT
CLIP LIGATING EXTRA MED SLVR (CLIP) ×1 IMPLANT
CLIP LIGATING EXTRA SM BLUE (MISCELLANEOUS) ×1 IMPLANT
COVER PROBE W GEL 5X96 (DRAPES) ×1 IMPLANT
DERMABOND ADVANCED .7 DNX12 (GAUZE/BANDAGES/DRESSINGS) ×1 IMPLANT
ELECT REM PT RETURN 9FT ADLT (ELECTROSURGICAL) ×1
ELECTRODE REM PT RTRN 9FT ADLT (ELECTROSURGICAL) ×1 IMPLANT
GLOVE BIO SURGEON STRL SZ7.5 (GLOVE) ×1 IMPLANT
GOWN STRL REUS W/ TWL LRG LVL3 (GOWN DISPOSABLE) ×2 IMPLANT
GOWN STRL REUS W/ TWL XL LVL3 (GOWN DISPOSABLE) ×1 IMPLANT
KIT BASIN OR (CUSTOM PROCEDURE TRAY) ×1 IMPLANT
KIT TURNOVER KIT B (KITS) ×1 IMPLANT
NS IRRIG 1000ML POUR BTL (IV SOLUTION) ×1 IMPLANT
PACK CV ACCESS (CUSTOM PROCEDURE TRAY) ×1 IMPLANT
PAD ARMBOARD 7.5X6 YLW CONV (MISCELLANEOUS) ×2 IMPLANT
POWDER SURGICEL 3.0 GRAM (HEMOSTASIS) IMPLANT
SLING ARM FOAM STRAP LRG (SOFTGOODS) IMPLANT
SLING ARM FOAM STRAP MED (SOFTGOODS) IMPLANT
SUT MNCRL AB 4-0 PS2 18 (SUTURE) ×1 IMPLANT
SUT PROLENE 6 0 BV (SUTURE) ×1 IMPLANT
SUT SILK 2 0 SH (SUTURE) IMPLANT
SUT VIC AB 3-0 SH 27X BRD (SUTURE) ×1 IMPLANT
TOWEL GREEN STERILE (TOWEL DISPOSABLE) ×1 IMPLANT
UNDERPAD 30X36 HEAVY ABSORB (UNDERPADS AND DIAPERS) ×1 IMPLANT
WATER STERILE IRR 1000ML POUR (IV SOLUTION) ×1 IMPLANT

## 2023-04-13 NOTE — H&P (Signed)
 RENNE CORNICK is a 78 y.o. ([birth year]) female who presents for evaluation of dialysis access.  She has a history of right brachiocephalic fistula creation on 04/24/2022 by Dr. Eliza.  She was cleared to use this fistula in October 2024.   She returns today for repeat evaluation.  She states ever since being cleared to use her fistula, dialysis has only been able to use 1 needle.  Typically the dialysis center will stick her fistula with one needle and her [facility] with another.  Every time they have attempted to use 2 needles on the fistula, it will infiltrate.  Her arm will significantly bruise and swell.   She denies any symptoms of steal.         Current Outpatient Medications  Medication Sig Dispense Refill   acetaminophen  (TYLENOL ) 500 MG tablet Take 500 mg by mouth every 6 (six) hours as needed for moderate pain.       amLODipine  (NORVASC ) 2.5 MG tablet Take 2 tablets by mouth daily. evening       anastrozole  (ARIMIDEX ) 1 MG tablet Take 1 tablet (1 mg total) by mouth daily. 90 tablet 3   atorvastatin  (LIPITOR ) 10 MG tablet Take 10 mg by mouth daily.       budesonide  (RHINOCORT  AQUA) 32 MCG/ACT nasal spray Place 2 sprays into both nostrils daily.       Cholecalciferol 50 MCG (2000 UT) CAPS 1 tablet Orally once a day       diclofenac  Sodium (VOLTAREN ) 1 % GEL Apply 1 Application topically 3 (three) times daily as needed (pain).       febuxostat  (ULORIC ) 40 MG tablet Take 40 mg by mouth daily.       hydrocortisone cream 1 % Apply 1 Application topically 2 (two) times daily as needed for itching.       Lidocaine  0.5 % AERO as directed Externally as needed       loratadine  (CLARITIN ) 10 MG tablet Take 10 mg by mouth 2 (two) times daily.       losartan  (COZAAR ) 100 MG tablet Take 100 mg by mouth daily.       melatonin 5 MG TABS Take 1 tablet (5 mg total) by mouth at bedtime as needed.   0   metoprolol  succinate (TOPROL -XL) 50 MG 24 hr tablet Take 50 mg by mouth daily. At bedtime        Multiple Vitamins-Minerals (PRESERVISION AREDS 2+MULTI VIT PO) Take 1 capsule by mouth daily.       pantoprazole  (PROTONIX ) 40 MG tablet Take 40 mg by mouth daily.       PROAIR  HFA 108 (90 BASE) MCG/ACT inhaler Inhale 2 puffs into the lungs every 6 (six) hours as needed (for respiratory issues.).           No current facility-administered medications for this visit.        REVIEW OF SYSTEMS (negative unless checked):    Cardiac:  []  Chest pain or chest pressure? []  Shortness of breath upon activity? []  Shortness of breath when lying flat? []  Irregular heart rhythm?   Vascular:  []  Pain in calf, thigh, or hip brought on by walking? []  Pain in feet at night that wakes you up from your sleep? []  Blood clot in your veins? []  Leg swelling?   Pulmonary:  []  Oxygen at home? []  Productive cough? []  Wheezing?   Neurologic:  []  Sudden weakness in arms or legs? []  Sudden numbness in arms or legs? []  Sudden  onset of difficult speaking or slurred speech? []  Temporary loss of vision in one eye? []  Problems with dizziness?   Gastrointestinal:  []  Blood in stool? []  Vomited blood?   Genitourinary:  []  Burning when urinating? []  Blood in urine?   Psychiatric:  []  Major depression   Hematologic:  []  Bleeding problems? []  Problems with blood clotting?   Dermatologic:  []  Rashes or ulcers?   Constitutional:  []  Fever or chills?   Ear/Nose/Throat:  []  Change in hearing? []  Nose bleeds? []  Sore throat?   Musculoskeletal:  []  Back pain? []  Joint pain? []  Muscle pain?     Physical Examination     Vitals:   04/13/23 0732  BP: (!) 159/70  Pulse: (!) 102  Resp: 20  Temp: 98 F (36.7 C)  SpO2: 94%      General:  WDWN in NAD; vital signs documented above Gait: Not observed HENT: WNL, normocephalic Pulmonary: normal non-labored breathing , without rales, rhonchi,  wheezing Cardiac: regular Abdomen: soft, NT, no masses Skin: without rashes Vascular  Exam/Pulses: palpable right radial pulse Extremities: right upper arm fistula with strong thrill on palpation, slightly pulsatile  Musculoskeletal: no muscle wasting or atrophy       Neurologic: A&O X 3;  No focal weakness or paresthesias are detected Psychiatric:  The pt has Normal affect.     Non-invasive Vascular Imaging    Right Arm Access Duplex  (03/06/2023):  +--------------------+----------+-----------------+--------+  AVF                PSV (cm/s)Flow Vol (mL/min)Comments  +--------------------+----------+-----------------+--------+  Native artery inflow   316          1275                 +--------------------+----------+-----------------+--------+  AVF Anastomosis        724                               +--------------------+----------+-----------------+--------+     +------------+----------+-------------+----------+--------+  OUTFLOW VEINPSV (cm/s)Diameter (cm)Depth (cm)Describe  +------------+----------+-------------+----------+--------+  Prox UA        272        0.61        0.69             +------------+----------+-------------+----------+--------+  Mid UA         474        0.66        0.54             +------------+----------+-------------+----------+--------+  Dist UA        151        0.70        0.62             +------------+----------+-------------+----------+--------+  AC Fossa       404        0.78        0.34             +------------+----------+-------------+----------+--------+          Medical Decision Making    LUCILLE WITTS is a 78 y.o. female who presents for fistula evaluation with difficulty cannulating. She has undergone fistulogram and now planned for superficialization vs transposition. We have again discussed the risks, benefits and alternatives and she demonstrates good understanding.   Clotilde Loth C. Sheree, MD [facility] [place] Office: [phone] Pager:  [phone]

## 2023-04-13 NOTE — Anesthesia Preprocedure Evaluation (Addendum)
 Anesthesia Evaluation  Patient identified by MRN, date of birth, ID band Patient awake    Reviewed: Allergy & Precautions, NPO status , Patient's Chart, lab work & pertinent test results  History of Anesthesia Complications Negative for: history of anesthetic complications  Airway Mallampati: I  TM Distance: >3 FB Neck ROM: Full    Dental  (+) Dental Advisory Given, Teeth Intact   Pulmonary asthma , former smoker   Pulmonary exam normal        Cardiovascular hypertension, Pt. on medications and Pt. on home beta blockers Normal cardiovascular exam     Neuro/Psych negative neurological ROS  negative psych ROS   GI/Hepatic Neg liver ROS,GERD  Medicated and Controlled,,  Endo/Other  negative endocrine ROS    Renal/GU ESRF and DialysisRenal disease  Female GU complaint     Musculoskeletal negative musculoskeletal ROS (+)   Gout    Abdominal   Peds  Hematology negative hematology ROS (+)   Anesthesia Other Findings   Reproductive/Obstetrics  Breast cancer                              Anesthesia Physical Anesthesia Plan  ASA: 3  Anesthesia Plan: General   Post-op Pain Management: Tylenol  PO (pre-op )*   Induction: Intravenous  PONV Risk Score and Plan: 3 and Treatment may vary due to age or medical condition, Ondansetron  and Propofol  infusion  Airway Management Planned: LMA  Additional Equipment: None  Intra-op Plan:   Post-operative Plan: Extubation in OR  Informed Consent: I have reviewed the patients History and Physical, chart, labs and discussed the procedure including the risks, benefits and alternatives for the proposed anesthesia with the patient or authorized representative who has indicated his/her understanding and acceptance.     Dental advisory given  Plan Discussed with: CRNA, Anesthesiologist and Surgeon  Anesthesia Plan Comments:          Anesthesia Quick Evaluation

## 2023-04-13 NOTE — Anesthesia Postprocedure Evaluation (Signed)
 Anesthesia Post Note  Patient: Rachel Villarreal  Procedure(s) Performed: FISTULA SUPERFICIALIZATION (Right)     Patient location during evaluation: PACU Anesthesia Type: General Level of consciousness: awake and alert Pain management: pain level controlled Vital Signs Assessment: post-procedure vital signs reviewed and stable Respiratory status: spontaneous breathing, nonlabored ventilation and respiratory function stable Cardiovascular status: stable and blood pressure returned to baseline Anesthetic complications: no   No notable events documented.  Last Vitals:  Vitals:   04/13/23 1215 04/13/23 1228  BP: (!) 133/50 (!) 143/53  Pulse: 88 90  Resp: 20 16  Temp:  36.9 C  SpO2: 92% 92%    Last Pain:  Vitals:   04/13/23 1228  PainSc: 0-No pain                 Debby FORBES Like

## 2023-04-13 NOTE — Discharge Instructions (Signed)

## 2023-04-13 NOTE — Transfer of Care (Signed)
 Immediate Anesthesia Transfer of Care Note  Patient: Rachel Villarreal  Procedure(s) Performed: FISTULA SUPERFICIALIZATION (Right)  Patient Location: PACU  Anesthesia Type:General  Level of Consciousness: drowsy, patient cooperative, and responds to stimulation  Airway & Oxygen Therapy: Patient Spontanous Breathing and Patient connected to nasal cannula oxygen  Post-op Assessment: Report given to RN and Post -op Vital signs reviewed and stable  Post vital signs: Reviewed and stable  Last Vitals:  Vitals Value Taken Time  BP 134/50 04/13/23 1153  Temp    Pulse 92 04/13/23 1156  Resp 20 04/13/23 1156  SpO2 100 % 04/13/23 1156  Vitals shown include unfiled device data.  Last Pain:  Vitals:   04/13/23 0753  PainSc: 0-No pain         Complications: No notable events documented.

## 2023-04-13 NOTE — Anesthesia Procedure Notes (Signed)
 Procedure Name: LMA Insertion Date/Time: 04/13/2023 10:53 AM  Performed by: Nicholaus Ethelene SAILOR, CRNAPre-anesthesia Checklist: Patient identified, Emergency Drugs available, Suction available and Patient being monitored Patient Re-evaluated:Patient Re-evaluated prior to induction Oxygen Delivery Method: Circle System Utilized Preoxygenation: Pre-oxygenation with 100% oxygen Induction Type: IV induction Ventilation: Mask ventilation without difficulty LMA: LMA inserted LMA Size: 4.0 Number of attempts: 1 Airway Equipment and Method: Bite block Placement Confirmation: positive ETCO2 Tube secured with: Tape Dental Injury: Teeth and Oropharynx as per pre-operative assessment

## 2023-04-13 NOTE — Op Note (Signed)
    Patient name: Rachel Villarreal MRN: [id number] DOB: Dec 14, 1945 Sex: female  04/13/2023 Pre-operative Diagnosis: ESRD Post-operative diagnosis:  Same Surgeon:  Penne C. Sheree, MD Assistant: Damien Gin, MS3 Procedure Performed:  Revision of right arm cephalic vein fistula with branch ligation and superficialization  Indications: 78 year old female with end-stage renal disease currently dialyzing via catheter.  She has a right arm cephalic vein fistula which has had difficulty with cannulation.  She has undergone fistulogram there were multiple branches in the fistula is quite deep in the upper arm she is now indicated for revision.  Findings: Fistula measured at least 8 mm throughout its course.  It was not terribly serpentine there were several branches which were divided.  We were able to elevate this without needing to transpose it on a completion there was a very strong thrill in the fistula and it was just below the skin surface.  Fistula will be okay for cannulation in 4 weeks.   Procedure:  The patient was identified in the holding area and taken to the operating room where she was placed upon operating general anesthesia was induced.  She was to the prepped and draped in the right upper extremity usual fashion, antibiotics were administered and timeout was called.  We marked the fistula on the skin using ultrasound.  We then created 2 vertical incisions dissected down to the fistula.  The fistula was mobilized throughout its course the upper arm dividing branches between ties.  I elected not to transpose the fistula as this was easily superficial lysed.  We obtained stasis and the wounds we then closed the deep fascia below the fistula with 3-0 Vicryl suture.  We then mobilized some of the skin to close over both incisions these were closed directly over the fistula with 4-0 Monocryl.  Dermabond was placed at the skin level.  The patient was awakened from anesthesia having tolerated the  procedure well without immediate complication.  All counts were correct at completion.  EBL: 20cc   Hikaru Delorenzo C. Sheree, MD [facility] Office: [phone] Pager: [phone]

## 2023-04-14 ENCOUNTER — Encounter (HOSPITAL_COMMUNITY): Payer: Self-pay | Admitting: Vascular Surgery

## 2023-04-14 DIAGNOSIS — Z992 Dependence on renal dialysis: Secondary | ICD-10-CM | POA: Diagnosis not present

## 2023-04-14 DIAGNOSIS — N2581 Secondary hyperparathyroidism of renal origin: Secondary | ICD-10-CM | POA: Diagnosis not present

## 2023-04-14 DIAGNOSIS — N186 End stage renal disease: Secondary | ICD-10-CM | POA: Diagnosis not present

## 2023-04-14 DIAGNOSIS — Z4901 Encounter for fitting and adjustment of extracorporeal dialysis catheter: Secondary | ICD-10-CM | POA: Diagnosis not present

## 2023-04-14 DIAGNOSIS — R52 Pain, unspecified: Secondary | ICD-10-CM | POA: Diagnosis not present

## 2023-04-14 DIAGNOSIS — D509 Iron deficiency anemia, unspecified: Secondary | ICD-10-CM | POA: Diagnosis not present

## 2023-04-14 DIAGNOSIS — D689 Coagulation defect, unspecified: Secondary | ICD-10-CM | POA: Diagnosis not present

## 2023-04-16 ENCOUNTER — Emergency Department (HOSPITAL_BASED_OUTPATIENT_CLINIC_OR_DEPARTMENT_OTHER): Payer: Medicare Other

## 2023-04-16 ENCOUNTER — Inpatient Hospital Stay (HOSPITAL_BASED_OUTPATIENT_CLINIC_OR_DEPARTMENT_OTHER)
Admission: EM | Admit: 2023-04-16 | Discharge: 2023-04-25 | DRG: 252 | Disposition: A | Discharge status: Skilled Nursing Facility | Payer: Medicare Other | Attending: Internal Medicine | Admitting: Internal Medicine

## 2023-04-16 ENCOUNTER — Emergency Department (HOSPITAL_BASED_OUTPATIENT_CLINIC_OR_DEPARTMENT_OTHER): Payer: Medicare Other | Admitting: Radiology

## 2023-04-16 ENCOUNTER — Other Ambulatory Visit: Payer: Self-pay

## 2023-04-16 ENCOUNTER — Encounter (HOSPITAL_BASED_OUTPATIENT_CLINIC_OR_DEPARTMENT_OTHER): Payer: Self-pay | Admitting: Emergency Medicine

## 2023-04-16 DIAGNOSIS — J9601 Acute respiratory failure with hypoxia: Secondary | ICD-10-CM | POA: Diagnosis not present

## 2023-04-16 DIAGNOSIS — M6259 Muscle wasting and atrophy, not elsewhere classified, multiple sites: Secondary | ICD-10-CM | POA: Diagnosis not present

## 2023-04-16 DIAGNOSIS — E440 Moderate protein-calorie malnutrition: Secondary | ICD-10-CM | POA: Diagnosis not present

## 2023-04-16 DIAGNOSIS — G2581 Restless legs syndrome: Secondary | ICD-10-CM | POA: Diagnosis not present

## 2023-04-16 DIAGNOSIS — J101 Influenza due to other identified influenza virus with other respiratory manifestations: Secondary | ICD-10-CM | POA: Diagnosis not present

## 2023-04-16 DIAGNOSIS — R531 Weakness: Secondary | ICD-10-CM | POA: Diagnosis not present

## 2023-04-16 DIAGNOSIS — G4700 Insomnia, unspecified: Secondary | ICD-10-CM | POA: Diagnosis present

## 2023-04-16 DIAGNOSIS — J09X1 Influenza due to identified novel influenza A virus with pneumonia: Secondary | ICD-10-CM | POA: Diagnosis not present

## 2023-04-16 DIAGNOSIS — Z170 Estrogen receptor positive status [ER+]: Secondary | ICD-10-CM

## 2023-04-16 DIAGNOSIS — I700 Atherosclerosis of aorta: Secondary | ICD-10-CM | POA: Diagnosis present

## 2023-04-16 DIAGNOSIS — Z888 Allergy status to other drugs, medicaments and biological substances status: Secondary | ICD-10-CM

## 2023-04-16 DIAGNOSIS — Z743 Need for continuous supervision: Secondary | ICD-10-CM | POA: Diagnosis not present

## 2023-04-16 DIAGNOSIS — I480 Paroxysmal atrial fibrillation: Secondary | ICD-10-CM | POA: Diagnosis not present

## 2023-04-16 DIAGNOSIS — Z91048 Other nonmedicinal substance allergy status: Secondary | ICD-10-CM

## 2023-04-16 DIAGNOSIS — J45909 Unspecified asthma, uncomplicated: Secondary | ICD-10-CM | POA: Diagnosis not present

## 2023-04-16 DIAGNOSIS — Z7951 Long term (current) use of inhaled steroids: Secondary | ICD-10-CM

## 2023-04-16 DIAGNOSIS — I5023 Acute on chronic systolic (congestive) heart failure: Secondary | ICD-10-CM | POA: Diagnosis not present

## 2023-04-16 DIAGNOSIS — C50919 Malignant neoplasm of unspecified site of unspecified female breast: Secondary | ICD-10-CM | POA: Diagnosis not present

## 2023-04-16 DIAGNOSIS — M109 Gout, unspecified: Secondary | ICD-10-CM | POA: Diagnosis present

## 2023-04-16 DIAGNOSIS — I10 Essential (primary) hypertension: Secondary | ICD-10-CM | POA: Diagnosis present

## 2023-04-16 DIAGNOSIS — I214 Non-ST elevation (NSTEMI) myocardial infarction: Secondary | ICD-10-CM | POA: Diagnosis present

## 2023-04-16 DIAGNOSIS — E876 Hypokalemia: Secondary | ICD-10-CM | POA: Diagnosis not present

## 2023-04-16 DIAGNOSIS — Z79811 Long term (current) use of aromatase inhibitors: Secondary | ICD-10-CM

## 2023-04-16 DIAGNOSIS — K219 Gastro-esophageal reflux disease without esophagitis: Secondary | ICD-10-CM | POA: Diagnosis present

## 2023-04-16 DIAGNOSIS — N186 End stage renal disease: Secondary | ICD-10-CM | POA: Diagnosis present

## 2023-04-16 DIAGNOSIS — R0602 Shortness of breath: Secondary | ICD-10-CM | POA: Diagnosis not present

## 2023-04-16 DIAGNOSIS — I4892 Unspecified atrial flutter: Secondary | ICD-10-CM | POA: Diagnosis not present

## 2023-04-16 DIAGNOSIS — I2510 Atherosclerotic heart disease of native coronary artery without angina pectoris: Secondary | ICD-10-CM | POA: Diagnosis present

## 2023-04-16 DIAGNOSIS — N2581 Secondary hyperparathyroidism of renal origin: Secondary | ICD-10-CM | POA: Diagnosis present

## 2023-04-16 DIAGNOSIS — R112 Nausea with vomiting, unspecified: Secondary | ICD-10-CM | POA: Diagnosis not present

## 2023-04-16 DIAGNOSIS — I132 Hypertensive heart and chronic kidney disease with heart failure and with stage 5 chronic kidney disease, or end stage renal disease: Secondary | ICD-10-CM | POA: Diagnosis not present

## 2023-04-16 DIAGNOSIS — Z515 Encounter for palliative care: Secondary | ICD-10-CM | POA: Diagnosis not present

## 2023-04-16 DIAGNOSIS — E7800 Pure hypercholesterolemia, unspecified: Secondary | ICD-10-CM | POA: Diagnosis present

## 2023-04-16 DIAGNOSIS — Z992 Dependence on renal dialysis: Secondary | ICD-10-CM | POA: Diagnosis not present

## 2023-04-16 DIAGNOSIS — I120 Hypertensive chronic kidney disease with stage 5 chronic kidney disease or end stage renal disease: Secondary | ICD-10-CM | POA: Diagnosis not present

## 2023-04-16 DIAGNOSIS — D631 Anemia in chronic kidney disease: Secondary | ICD-10-CM | POA: Diagnosis not present

## 2023-04-16 DIAGNOSIS — J690 Pneumonitis due to inhalation of food and vomit: Secondary | ICD-10-CM | POA: Diagnosis not present

## 2023-04-16 DIAGNOSIS — K5900 Constipation, unspecified: Secondary | ICD-10-CM | POA: Diagnosis not present

## 2023-04-16 DIAGNOSIS — J45901 Unspecified asthma with (acute) exacerbation: Secondary | ICD-10-CM | POA: Diagnosis present

## 2023-04-16 DIAGNOSIS — R0902 Hypoxemia: Principal | ICD-10-CM

## 2023-04-16 DIAGNOSIS — M6281 Muscle weakness (generalized): Secondary | ICD-10-CM | POA: Diagnosis not present

## 2023-04-16 DIAGNOSIS — R0989 Other specified symptoms and signs involving the circulatory and respiratory systems: Secondary | ICD-10-CM | POA: Diagnosis not present

## 2023-04-16 DIAGNOSIS — Z7189 Other specified counseling: Secondary | ICD-10-CM | POA: Diagnosis not present

## 2023-04-16 DIAGNOSIS — R2681 Unsteadiness on feet: Secondary | ICD-10-CM | POA: Diagnosis not present

## 2023-04-16 DIAGNOSIS — E8809 Other disorders of plasma-protein metabolism, not elsewhere classified: Secondary | ICD-10-CM | POA: Diagnosis present

## 2023-04-16 DIAGNOSIS — T380X5A Adverse effect of glucocorticoids and synthetic analogues, initial encounter: Secondary | ICD-10-CM | POA: Diagnosis present

## 2023-04-16 DIAGNOSIS — J90 Pleural effusion, not elsewhere classified: Secondary | ICD-10-CM | POA: Diagnosis not present

## 2023-04-16 DIAGNOSIS — J9811 Atelectasis: Secondary | ICD-10-CM | POA: Diagnosis not present

## 2023-04-16 DIAGNOSIS — Z79899 Other long term (current) drug therapy: Secondary | ICD-10-CM

## 2023-04-16 DIAGNOSIS — J1008 Influenza due to other identified influenza virus with other specified pneumonia: Secondary | ICD-10-CM | POA: Diagnosis not present

## 2023-04-16 DIAGNOSIS — Z741 Need for assistance with personal care: Secondary | ICD-10-CM | POA: Diagnosis not present

## 2023-04-16 DIAGNOSIS — R918 Other nonspecific abnormal finding of lung field: Secondary | ICD-10-CM | POA: Diagnosis not present

## 2023-04-16 DIAGNOSIS — I4891 Unspecified atrial fibrillation: Secondary | ICD-10-CM | POA: Diagnosis not present

## 2023-04-16 DIAGNOSIS — K5730 Diverticulosis of large intestine without perforation or abscess without bleeding: Secondary | ICD-10-CM | POA: Diagnosis not present

## 2023-04-16 DIAGNOSIS — Z87891 Personal history of nicotine dependence: Secondary | ICD-10-CM

## 2023-04-16 DIAGNOSIS — Z90710 Acquired absence of both cervix and uterus: Secondary | ICD-10-CM

## 2023-04-16 DIAGNOSIS — Z7401 Bed confinement status: Secondary | ICD-10-CM | POA: Diagnosis not present

## 2023-04-16 DIAGNOSIS — C50412 Malignant neoplasm of upper-outer quadrant of left female breast: Secondary | ICD-10-CM | POA: Diagnosis present

## 2023-04-16 DIAGNOSIS — R7989 Other specified abnormal findings of blood chemistry: Secondary | ICD-10-CM

## 2023-04-16 DIAGNOSIS — Z6825 Body mass index (BMI) 25.0-25.9, adult: Secondary | ICD-10-CM

## 2023-04-16 DIAGNOSIS — Z91011 Allergy to milk products: Secondary | ICD-10-CM

## 2023-04-16 LAB — RESP PANEL BY RT-PCR (RSV, FLU A&B, COVID)  RVPGX2
Influenza A by PCR: POSITIVE — AB
Influenza B by PCR: NEGATIVE
Resp Syncytial Virus by PCR: NEGATIVE
SARS Coronavirus 2 by RT PCR: NEGATIVE

## 2023-04-16 LAB — BASIC METABOLIC PANEL
Anion gap: 13 (ref 5–15)
BUN: 48 mg/dL — ABNORMAL HIGH (ref 8–23)
CO2: 28 mmol/L (ref 22–32)
Calcium: 9.1 mg/dL (ref 8.9–10.3)
Chloride: 97 mmol/L — ABNORMAL LOW (ref 98–111)
Creatinine, Ser: 6.24 mg/dL — ABNORMAL HIGH (ref 0.44–1.00)
GFR, Estimated: 6 mL/min — ABNORMAL LOW (ref >60)
Glucose, Bld: 124 mg/dL — ABNORMAL HIGH (ref 70–99)
Potassium: 3.8 mmol/L (ref 3.5–5.1)
Sodium: 138 mmol/L (ref 135–145)

## 2023-04-16 LAB — CBC WITH DIFFERENTIAL/PLATELET
Abs Immature Granulocytes: 0.1 10*3/uL — ABNORMAL HIGH (ref 0.00–0.07)
Basophils Absolute: 0 10*3/uL (ref 0.0–0.1)
Basophils Relative: 0 %
Eosinophils Absolute: 0 10*3/uL (ref 0.0–0.5)
Eosinophils Relative: 0 %
HCT: 28.4 % — ABNORMAL LOW (ref 36.0–46.0)
Hemoglobin: 9.5 g/dL — ABNORMAL LOW (ref 12.0–15.0)
Immature Granulocytes: 1 %
Lymphocytes Relative: 11 %
Lymphs Abs: 0.8 10*3/uL (ref 0.7–4.0)
MCH: 31.9 pg (ref 26.0–34.0)
MCHC: 33.5 g/dL (ref 30.0–36.0)
MCV: 95.3 fL (ref 80.0–100.0)
Monocytes Absolute: 0.8 10*3/uL (ref 0.1–1.0)
Monocytes Relative: 11 %
Neutro Abs: 6.1 10*3/uL (ref 1.7–7.7)
Neutrophils Relative %: 77 %
Platelets: 182 10*3/uL (ref 150–400)
RBC: 2.98 MIL/uL — ABNORMAL LOW (ref 3.87–5.11)
RDW: 13.9 % (ref 11.5–15.5)
WBC: 7.8 10*3/uL (ref 4.0–10.5)
nRBC: 0 % (ref 0.0–0.2)

## 2023-04-16 LAB — HEPARIN LEVEL (UNFRACTIONATED): Heparin Unfractionated: 0.34 [IU]/mL (ref 0.30–0.70)

## 2023-04-16 LAB — BRAIN NATRIURETIC PEPTIDE: B Natriuretic Peptide: 3699.7 pg/mL — ABNORMAL HIGH (ref 0.0–100.0)

## 2023-04-16 LAB — TROPONIN I (HIGH SENSITIVITY)
Troponin I (High Sensitivity): 5456 ng/L (ref <18)
Troponin I (High Sensitivity): 4458 ng/L (ref <18)

## 2023-04-16 MED ORDER — ALBUTEROL SULFATE (2.5 MG/3ML) 0.083% IN NEBU
2.5000 mg | INHALATION_SOLUTION | Freq: Once | RESPIRATORY_TRACT | Status: AC
  Administered 2023-04-16: 2.5 mg via RESPIRATORY_TRACT
  Filled 2023-04-16: qty 3

## 2023-04-16 MED ORDER — SODIUM CHLORIDE 0.9 % IV SOLN
1.0000 g | Freq: Once | INTRAVENOUS | Status: AC
  Administered 2023-04-16: 1 g via INTRAVENOUS
  Filled 2023-04-16: qty 10

## 2023-04-16 MED ORDER — IOHEXOL 350 MG/ML SOLN
100.0000 mL | Freq: Once | INTRAVENOUS | Status: AC | PRN
  Administered 2023-04-16: 75 mL via INTRAVENOUS

## 2023-04-16 MED ORDER — ASPIRIN 81 MG PO CHEW
324.0000 mg | CHEWABLE_TABLET | Freq: Once | ORAL | Status: AC
  Administered 2023-04-16: 324 mg via ORAL
  Filled 2023-04-16: qty 4

## 2023-04-16 MED ORDER — SODIUM CHLORIDE 0.9 % IV SOLN
500.0000 mg | Freq: Once | INTRAVENOUS | Status: AC
  Administered 2023-04-16: 500 mg via INTRAVENOUS
  Filled 2023-04-16: qty 5

## 2023-04-16 MED ORDER — METOPROLOL TARTRATE 25 MG PO TABS
25.0000 mg | ORAL_TABLET | Freq: Four times a day (QID) | ORAL | Status: DC
  Administered 2023-04-17 (×2): 25 mg via ORAL
  Filled 2023-04-16 (×2): qty 1

## 2023-04-16 MED ORDER — METOPROLOL TARTRATE 5 MG/5ML IV SOLN
5.0000 mg | Freq: Once | INTRAVENOUS | Status: AC
  Administered 2023-04-16: 5 mg via INTRAVENOUS
  Filled 2023-04-16: qty 5

## 2023-04-16 MED ORDER — ALBUTEROL SULFATE (2.5 MG/3ML) 0.083% IN NEBU
5.0000 mg | INHALATION_SOLUTION | Freq: Once | RESPIRATORY_TRACT | Status: DC
  Filled 2023-04-16: qty 6

## 2023-04-16 MED ORDER — OSELTAMIVIR PHOSPHATE 30 MG PO CAPS
30.0000 mg | ORAL_CAPSULE | Freq: Once | ORAL | Status: AC
  Administered 2023-04-16: 30 mg via ORAL
  Filled 2023-04-16: qty 1

## 2023-04-16 MED ORDER — HEPARIN (PORCINE) 25000 UT/250ML-% IV SOLN
800.0000 [IU]/h | INTRAVENOUS | Status: DC
  Administered 2023-04-16 – 2023-04-17 (×2): 800 [IU]/h via INTRAVENOUS
  Filled 2023-04-16 (×2): qty 250

## 2023-04-16 MED ORDER — IPRATROPIUM-ALBUTEROL 0.5-2.5 (3) MG/3ML IN SOLN
3.0000 mL | Freq: Once | RESPIRATORY_TRACT | Status: AC
  Administered 2023-04-16: 3 mL via RESPIRATORY_TRACT
  Filled 2023-04-16: qty 3

## 2023-04-16 MED ORDER — ALBUTEROL SULFATE (2.5 MG/3ML) 0.083% IN NEBU
5.0000 mg | INHALATION_SOLUTION | Freq: Once | RESPIRATORY_TRACT | Status: AC
  Administered 2023-04-16: 5 mg via RESPIRATORY_TRACT
  Filled 2023-04-16: qty 6

## 2023-04-16 MED ORDER — OSELTAMIVIR PHOSPHATE 30 MG PO CAPS: 30.0000 mg | ORAL_CAPSULE | Freq: Every day | ORAL | Status: DC

## 2023-04-16 MED ORDER — HEPARIN BOLUS VIA INFUSION
3800.0000 [IU] | Freq: Once | INTRAVENOUS | Status: AC
  Administered 2023-04-16: 3800 [IU] via INTRAVENOUS

## 2023-04-16 NOTE — ED Provider Notes (Signed)
 [facility] AT [facility] Provider Note  CSN: [id number] Arrival date & time: 04/16/23 1122  Chief Complaint(s) Shortness of Breath  HPI Rachel Villarreal is a 78 y.o. 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 Caine.  On Saturday she began feeling unwell again, coughing, dyspnea, exertional dyspnea, chest tightness.  Felt her asthma was flaring up.  No fevers or chills.  She does not wear home oxygen.  No nausea or vomiting.  Compliant with her medications.  Tried albuterol  at home which did not alleviate her symptoms and reported to the ED for evaluation  Past Medical History Past Medical History:  Diagnosis Date   Abnormal Pap smear of vagina    Anemia    Asthma    Mild   Cancer (HCC)    left breast ILC   Chronic kidney disease (CKD)    stage 4   GERD (gastroesophageal reflux disease)    Gout 05/2012   Hernia, incisional    History of abnormal Pap smear    CIN III, 2001   History of blood transfusion    POST OP   History of uterine fibroid    Hypertension    Peritonitis (HCC)    Respiratory failure (HCC)    after ruptured appendix   Ruptured appendix    Seasonal allergies    Shingles 02/2013   very mild case   Urine incontinence    Patient Active Problem List   Diagnosis Date Noted   ESRD (end stage renal disease) (HCC) 03/09/2023   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   Insomnia 03/09/2023   History of hysterectomy, supracervical 03/01/2022   History of loop electrical excision procedure (LEEP) 03/01/2022   Breast cancer (HCC) 09/12/2021   Malignant neoplasm of upper-outer quadrant of  left breast in female, estrogen receptor positive (HCC) 08/29/2021   Allergic rhinitis 01/20/2021   Chronic kidney disease, stage 4 (severe) (HCC) 01/20/2021   GERD (gastroesophageal reflux disease) 01/20/2021   Osteopenia 01/20/2021   Pure hypercholesterolemia 01/20/2021   Vitamin D deficiency 01/20/2021   Gout 03/28/2016   Essential hypertension 03/28/2016   Hyperlipidemia 03/28/2016   Hormone replacement therapy (HRT) 11/18/2013   Home Medication(s) Prior to Admission medications   Medication Sig Start Date End Date Taking? Authorizing Provider  acetaminophen  (TYLENOL ) 500 MG tablet Take 500 mg by mouth every 6 (six) hours as needed for mild pain (pain score 1-3).    [provider]  amLODipine  (NORVASC ) 5 MG tablet Take 5 mg by mouth in the morning. 02/15/23   [provider]  anastrozole  (ARIMIDEX ) 1 MG tablet Take 1 tablet (1 mg total) by mouth daily. 09/07/22   Gudena, Vinay, MD  atorvastatin  (LIPITOR ) 10 MG tablet Take 10 mg by mouth in the morning.    [provider]  budesonide  (RHINOCORT  AQUA) 32 MCG/ACT nasal spray Place 2 sprays into both nostrils in the morning. 12/12/22   [provider]  Cholecalciferol 50 MCG (2000 UT) CAPS Take 2,000 Units by mouth in the morning. 12/27/22   [provider]  diclofenac  Sodium (VOLTAREN ) 1 % GEL Apply 1 Application topically 4 (four) times daily as needed (pain.).  [provider]  febuxostat  (ULORIC ) 40 MG tablet Take 40 mg by mouth in the morning.    [provider]  furosemide  (LASIX ) 40 MG tablet Take 80 mg by mouth 4 (four) times a week. Take 2 tablets (80 mg) by mouth once daily on non-dialysis days Mondays, Wednesdays, Fridays & Sundays. 02/24/23   [provider]  loratadine  (CLARITIN ) 10 MG tablet Take 10 mg by mouth in the morning and at bedtime. 12/12/22   [provider]  losartan  (COZAAR ) 100 MG tablet Take 100 mg by mouth in the morning. 07/27/21    [provider]  melatonin 5 MG TABS Take 1 tablet (5 mg total) by mouth at bedtime as needed. Patient taking differently: Take 10 mg by mouth at bedtime. 08/29/21   Gudena, Vinay, MD  metoprolol  succinate (TOPROL -XL) 50 MG 24 hr tablet Take 50 mg by mouth at bedtime. At bedtime    [provider]  Multiple Vitamins-Minerals (PRESERVISION AREDS 2+MULTI VIT PO) Take 1 capsule by mouth in the morning and at bedtime.    [provider]  oxyCODONE -acetaminophen  (PERCOCET/ROXICET) 5-325 MG tablet Take 1 tablet by mouth every 6 (six) hours as needed for severe pain (pain score 7-10). 04/13/23   Gerome Maurilio HERO, PA-C  pantoprazole  (PROTONIX ) 40 MG tablet Take 40 mg by mouth daily.    [provider]  PROAIR  HFA 108 (90 BASE) MCG/ACT inhaler Inhale 1-2 puffs into the lungs every 6 (six) hours as needed for shortness of breath or wheezing (for respiratory issues.). 12/03/14   [provider]  sevelamer  (RENAGEL ) 800 MG tablet Take 800 mg by mouth 3 (three) times daily with meals. 03/05/23   [provider]                                                                                                                                    Past Surgical History Past Surgical History:  Procedure Laterality Date   A/V FISTULAGRAM Right 03/19/2023   Procedure: A/V Fistulagram;  Surgeon: Sheree Penne Bruckner, MD;  Location: [facility] INVASIVE CV LAB;  Service: Cardiovascular;  Laterality: Right;   APPENDECTOMY     AV FISTULA PLACEMENT Right 04/24/2022   Procedure: RIGHT BRACHIOCEPHALIC ARTERIOVENOUS (AV) FISTULA CREATION;  Surgeon: Eliza Bruckner RAMAN, MD;  Location: [facility] OR;  Service: Vascular;  Laterality: Right;   BREAST BIOPSY Right    fibrocystic changes   BREAST BIOPSY Left 08/02/2021   BREAST LUMPECTOMY WITH RADIOACTIVE SEED LOCALIZATION Left 09/12/2021   Procedure: LEFT BREAST LUMPECTOMY WITH RADIOACTIVE SEED LOCALIZATION;  Surgeon: Ebbie Cough, MD;   Location: [facility];  Service: General;  Laterality: Left;   CATARACT EXTRACTION Bilateral    COLONOSCOPY     COLONOSCOPY WITH PROPOFOL  N/A 02/18/2018   Procedure: COLONOSCOPY WITH PROPOFOL ;  Surgeon: Rosalie Kitchens, MD;  Location: [facility] ENDOSCOPY;  Service: Endoscopy;  Laterality: N/A;  Use ultraslim scope  FISTULA SUPERFICIALIZATION Right 04/13/2023   Procedure: FISTULA SUPERFICIALIZATION;  Surgeon: Sheree Penne Bruckner, MD;  Location: [facility] OR;  Service: Vascular;  Laterality: Right;   IR FLUORO GUIDE CV LINE RIGHT  03/10/2023   LEEP  2000   CIN 2/3   RE-EXCISION OF BREAST LUMPECTOMY Left 09/29/2021   Procedure: LEFT  BREAST RE-EXCISION LUMPECTOMY;  Surgeon: Ebbie Cough, MD;  Location: [facility] ORS;  Service: General;  Laterality: Left;   REFRACTIVE SURGERY     SUPRACERVICAL ABDOMINAL HYSTERECTOMY  2001   Family History Family History  Problem Relation Age of Onset   Colon cancer Father    Breast cancer Neg Hx     Social History Social History   Tobacco Use   Smoking status: Former    Current packs/day: 0.00    Types: Cigarettes    Quit date: 04/10/1972    Years since quitting: 51.0   Smokeless tobacco: Never  Vaping Use   Vaping status: Never Used  Substance Use Topics   Alcohol  use: No   Drug use: No   Allergies Ace inhibitors, Lactose intolerance (gi), Letrozole , Lisinopril, Mercury, and Tape  Review of Systems Review of Systems  Constitutional:  Positive for fatigue.  HENT:  Positive for congestion and rhinorrhea.   Respiratory:  Positive for cough, chest tightness and shortness of breath.   Cardiovascular:  Negative for chest pain and palpitations.  Gastrointestinal:  Negative for abdominal pain, nausea and vomiting.  Genitourinary:  Negative for difficulty urinating and dysuria.  Musculoskeletal:  Positive for arthralgias.  Neurological:  Negative for light-headedness.  All other systems reviewed and are negative.   Physical Exam Vital Signs   I have reviewed the triage vital signs BP (!) 138/47 (BP Location: Left Arm)   Pulse (!) 109   Temp 99.2 F (37.3 C) (Oral)   Resp (!) 23   SpO2 98%  Physical Exam Vitals and nursing note reviewed.  Constitutional:      General: She is not in acute distress.    Appearance: Normal appearance. She is ill-appearing.  HENT:     Head: Normocephalic and atraumatic.     Right Ear: External ear normal.     Left Ear: External ear normal.     Nose: Nose normal.     Mouth/Throat:     Mouth: Mucous membranes are moist.  Eyes:     General: No scleral icterus.       Right eye: No discharge.        Left eye: No discharge.  Cardiovascular:     Rate and Rhythm: Tachycardia present.     Pulses: Normal pulses.     Heart sounds: Normal heart sounds.  Pulmonary:     Effort: Pulmonary effort is normal. Tachypnea present.     Breath sounds: Decreased air movement present. No stridor. Decreased breath sounds present.     Comments: Mild respiratory distress Coarse breath sounds bilateral Abdominal:     General: Abdomen is flat. There is no distension.     Palpations: Abdomen is soft.     Tenderness: There is no abdominal tenderness.  Musculoskeletal:     Cervical back: No rigidity.     Right lower leg: No edema.     Left lower leg: No edema.  Skin:    General: Skin is warm and dry.     Capillary Refill: Capillary refill takes less than 2 seconds.       Neurological:     Mental Status: She is alert.  Psychiatric:        Mood and Affect: Mood normal.        Behavior: Behavior normal. Behavior is cooperative.     ED Results and Treatments Labs (all labs ordered are listed, but only abnormal results are displayed) Labs Reviewed  RESP PANEL BY RT-PCR (RSV, FLU A&B, COVID)  RVPGX2 - Abnormal; Notable for the following components:      Result Value   Influenza A by PCR POSITIVE (*)    All other components within normal limits  BASIC METABOLIC PANEL - Abnormal; Notable for the following  components:   Chloride 97 (*)    Glucose, Bld 124 (*)    BUN 48 (*)    Creatinine, Ser 6.24 (*)    GFR, Estimated 6 (*)    All other components within normal limits  CBC WITH DIFFERENTIAL/PLATELET - Abnormal; Notable for the following components:   RBC 2.98 (*)    Hemoglobin 9.5 (*)    HCT 28.4 (*)    Abs Immature Granulocytes 0.10 (*)    All other components within normal limits  BRAIN NATRIURETIC PEPTIDE - Abnormal; Notable for the following components:   B Natriuretic Peptide 3,699.7 (*)    All other components within normal limits  TROPONIN I (HIGH SENSITIVITY) - Abnormal; Notable for the following components:   Troponin I (High Sensitivity) 5,456 (*)    All other components within normal limits  TROPONIN I (HIGH SENSITIVITY) - Abnormal; Notable for the following components:   Troponin I (High Sensitivity) 4,458 (*)    All other components within normal limits  HEPARIN  LEVEL (UNFRACTIONATED)                                                                                                                          Radiology DG Chest 2 View Result Date: 04/16/2023 CLINICAL DATA:  Shortness of breath for 3 days, worse with ambulation. Cold-like symptoms last week. Hemodialysis patient. EXAM: CHEST - 2 VIEW COMPARISON:  Radiographs 08/23/2022 and 12/21/2020.  CT 11/23/2022. FINDINGS: Right IJ hemodialysis catheter projects over the upper right atrium. The heart size and mediastinal contours are stable. Increased mildly heterogeneous perihilar opacities in both lungs compared with prior radiographs. No confluent airspace disease or significant pleural effusion identified. There is chronic scarring in the lingula. No pneumothorax. The bones appear unchanged. IMPRESSION: Increased mildly heterogeneous perihilar opacities in both lungs, possibly mild edema or atypical infection. No confluent airspace disease or significant pleural effusion. Short-term radiographic follow-up recommended if the  patient remains symptomatic. Electronically Signed   By: Elsie Perone M.D.   On: 04/16/2023 13:40    Pertinent labs & imaging results that were available during my care of the patient were reviewed by me and considered in my medical decision making (see MDM for details).  Medications Ordered in ED Medications  albuterol  (PROVENTIL ) (2.5 MG/3ML) 0.083% nebulizer solution 5 mg (has no administration in time range)  heparin  ADULT infusion 100 units/mL (25000 units/250mL) (  800 Units/hr Intravenous New Bag/Given 04/16/23 1449)  albuterol  (PROVENTIL ) (2.5 MG/3ML) 0.083% nebulizer solution 2.5 mg (2.5 mg Nebulization Given 04/16/23 1207)  ipratropium-albuterol  (DUONEB) 0.5-2.5 (3) MG/3ML nebulizer solution 3 mL (3 mLs Nebulization Given 04/16/23 1207)  albuterol  (PROVENTIL ) (2.5 MG/3ML) 0.083% nebulizer solution 5 mg (5 mg Nebulization Given 04/16/23 1248)  aspirin  chewable tablet 324 mg (324 mg Oral Given 04/16/23 1438)  heparin  bolus via infusion 3,800 Units (3,800 Units Intravenous Bolus from Bag 04/16/23 1450)  oseltamivir  (TAMIFLU ) capsule 30 mg (30 mg Oral Given 04/16/23 1618)                                                                                                                                     Procedures .Critical Care  Performed by: Elnor Jayson LABOR, DO Authorized by: Elnor Jayson LABOR, DO   Critical care provider statement:    Critical care time (minutes):  70   Critical care time was exclusive of:  Separately billable procedures and treating other patients   Critical care was necessary to treat or prevent imminent or life-threatening deterioration of the following conditions:  Cardiac failure and respiratory failure   Critical care was time spent personally by me on the following activities:  Development of treatment plan with patient or surrogate, discussions with consultants, evaluation of patient's response to treatment, examination of patient, ordering and review of laboratory studies,  ordering and review of radiographic studies, ordering and performing treatments and interventions, pulse oximetry, re-evaluation of patient's condition, review of old charts and obtaining history from patient or surrogate   (including critical care time)  Medical Decision Making / ED Course    Medical Decision Making:    Rachel Villarreal is a 78 y.o. 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 The complaint involves an extensive differential diagnosis and also carries with it a high risk of complications and morbidity.  Serious etiology was considered. Ddx includes but is not limited to: In my evaluation of this patient's dyspnea my DDx includes, but is not limited to, pneumonia, pulmonary embolism, pneumothorax, pulmonary edema, metabolic acidosis, asthma, COPD, cardiac cause, anemia, anxiety, etc.    Complete initial physical exam performed, notably the patient was in mild respiratory stress, she is hypoxic, tachycardia and tachypnea noted..    Reviewed and confirmed nursing documentation for past medical history, family history, social history.  Vital signs reviewed.      Clinical Course as of 04/16/23 1628  Mon Apr 16, 2023  1330 Influenza A By PCR(!): POSITIVE [SG]  1427 B Natriuretic Peptide(!): 3,699.7 She does not appear overtly volume overloaded on exam.  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  Here with URI type symptoms, dyspnea.  Proxy on room air  on arrival, placed on 2 L nasal cannula improvement to pulse ox greater than 94%.  Given nebulized breathing treatments, coarse breath sounds bilateral.  Hypoxia on arrival, improved with nasal cannula.  EKG with multiple PACs, no STEMI.  Troponin over 5000, she has no chest pain but does have dyspnea, hypoxia.  Will repeat EKG, give aspirin , start heparin .  Given recent surgery on Friday, hypoxia and elevated troponin we will get CT PE. Will discuss with cardiology  She is positive for flu A.  She is hypoxic, she has possible pneumonia on her chest x-ray. Will get CTPE to further eval given recent surg and new hypoxia, handoff to incoming EDP pending CT PE and admission. ?viral myocarditis given flu +  Continue heparin   Will need HD non-emergently while admitted               Additional history obtained: -Additional history obtained from family -External records from outside source obtained and reviewed including: Chart review including previous notes, labs, imaging, consultation notes including  Prior labs/imaging/home meds/recent surgery   Lab Tests: -I ordered, reviewed, and interpreted labs.   The pertinent results include:   Labs Reviewed  RESP PANEL BY RT-PCR (RSV, FLU A&B, COVID)  RVPGX2 - Abnormal; Notable for the following components:      Result Value   Influenza A by PCR POSITIVE (*)    All other components within normal limits  BASIC METABOLIC PANEL - Abnormal; Notable for the following components:   Chloride 97 (*)    Glucose, Bld 124 (*)    BUN 48 (*)    Creatinine, Ser 6.24 (*)    GFR, Estimated 6 (*)    All other components within normal limits  CBC WITH DIFFERENTIAL/PLATELET - Abnormal; Notable for the following components:   RBC 2.98 (*)    Hemoglobin 9.5 (*)    HCT 28.4 (*)    Abs Immature Granulocytes 0.10 (*)    All other components within normal limits  BRAIN NATRIURETIC PEPTIDE - Abnormal; Notable for the following components:   B  Natriuretic Peptide 3,699.7 (*)    All other components within normal limits  TROPONIN I (HIGH SENSITIVITY) - Abnormal; Notable for the following components:   Troponin I (High Sensitivity) 5,456 (*)    All other components within normal limits  TROPONIN I (HIGH SENSITIVITY) - Abnormal; Notable for the following components:   Troponin I (High Sensitivity) 4,458 (*)    All other components within normal limits  HEPARIN  LEVEL (UNFRACTIONATED)    Notable for flu + trop + bnp+  EKG   EKG Interpretation Date/Time:  Monday April 16 2023 11:45:07 EST Ventricular Rate:  98 PR Interval:  142 QRS Duration:  70 QT Interval:  334 QTC Calculation: 426 R Axis:   113  Text Interpretation: Sinus rhythm with Premature atrial complexes and Premature ventricular complexes or Fusion complexes Right axis deviation Septal infarct (cited on or before 09-Mar-2023) Abnormal ECG When compared with ECG of 09-Mar-2023 11:51, Fusion complexes are now Present Premature ventricular complexes are now Present Premature atrial complexes are now Present QRS axis Shifted right Questionable change  in initial forces of Septal leads Confirmed by Elnor Savant ([id number]) on 04/16/2023 2:25:42 PM         Imaging Studies ordered: I ordered imaging studies including cxr, ctpe (CT PENDING) I independently visualized the following imaging with scope of interpretation limited to determining acute life threatening conditions related to emergency care; findings noted above I independently visualized and interpreted imaging. I agree with the radiologist interpretation   Medicines ordered and prescription drug management: Meds ordered this encounter  Medications   albuterol  (PROVENTIL ) (2.5 MG/3ML) 0.083% nebulizer solution 2.5 mg   ipratropium-albuterol  (DUONEB) 0.5-2.5 (3) MG/3ML nebulizer solution 3 mL   albuterol  (PROVENTIL ) (2.5 MG/3ML) 0.083% nebulizer solution 5 mg   albuterol  (PROVENTIL ) (2.5 MG/3ML) 0.083% nebulizer  solution 5 mg   aspirin  chewable tablet 324 mg   heparin  bolus via infusion 3,800 Units   heparin  ADULT infusion 100 units/mL (25000 units/250mL)   oseltamivir  (TAMIFLU ) capsule 30 mg    -I have reviewed the patients home medicines and have made adjustments as needed   Consultations Obtained: I requested consultation with the dr michele,  and discussed lab and imaging findings as well as pertinent plan - they recommend: will see in consult   Cardiac Monitoring: The patient was maintained on a cardiac monitor.  I personally viewed and interpreted the cardiac monitored which showed an underlying rhythm of: sinus tachy Continuous pulse oximetry interpreted by myself, 98% on 2L.    Social Determinants of Health:  Diagnosis or treatment significantly limited by social determinants of health: former smoker   Reevaluation: After the interventions noted above, I reevaluated the patient and found that they have improved  Co morbidities that complicate the patient evaluation  Past Medical History:  Diagnosis Date   Abnormal Pap smear of vagina    Anemia    Asthma    Mild   Cancer (HCC)    left breast ILC   Chronic kidney disease (CKD)    stage 4   GERD (gastroesophageal reflux disease)    Gout 05/2012   Hernia, incisional    History of abnormal Pap smear    CIN III, 2001   History of blood transfusion    POST OP   History of uterine fibroid    Hypertension    Peritonitis (HCC)    Respiratory failure (HCC)    after ruptured appendix   Ruptured appendix    Seasonal allergies    Shingles 02/2013   very mild case   Urine incontinence       Dispostion: Disposition decision including need for hospitalization was considered, and patient disposition pending at time of sign out.    Final Clinical Impression(s) / ED Diagnoses Final diagnoses:  Hypoxia  Troponin level elevated  ESRD on hemodialysis (HCC)  Influenza A        Elnor Savant LABOR, DO 04/16/23 1628

## 2023-04-16 NOTE — Progress Notes (Signed)
 PHARMACY - ANTICOAGULATION CONSULT NOTE  Pharmacy Consult for heparin  Indication: chest pain/ACS  Allergies  Allergen Reactions   Ace Inhibitors     angioedema   Lactose Intolerance (Gi) Other (See Comments)    GI upset   Letrozole  Hives   Lisinopril Swelling   Mercury     Red eyes, through eye drops that contained thimerosal    Tape Dermatitis    plastic    Patient Measurements:   Heparin  Dosing Weight: 63kg  Vital Signs: Temp: 98.1 F (36.7 C) (01/06 1147) BP: 146/70 (01/06 1230) Pulse Rate: 107 (01/06 1230)  Labs: Recent Labs    04/16/23 1230  HGB 9.5*  HCT 28.4*  PLT 182  CREATININE 6.24*  TROPONINIHS 5,456*    Estimated Creatinine Clearance: 6.4 mL/min (A) (by C-G formula based on SCr of 6.24 mg/dL (H)).   Medical History: Past Medical History:  Diagnosis Date   Abnormal Pap smear of vagina    Anemia    Asthma    Mild   Cancer (HCC)    left breast ILC   Chronic kidney disease (CKD)    stage 4   GERD (gastroesophageal reflux disease)    Gout 05/2012   Hernia, incisional    History of abnormal Pap smear    CIN III, 2001   History of blood transfusion    POST OP   History of uterine fibroid    Hypertension    Peritonitis (HCC)    Respiratory failure (HCC)    after ruptured appendix   Ruptured appendix    Seasonal allergies    Shingles 02/2013   very mild case   Urine incontinence     Medications:  Infusions:   heparin       Assessment: 68 yof presented to the ED with SOB. Troponin elevated and now starting IV heparin . Baseline Hgb is low at 9.5 but appears to be her baseline and platelets are WNL. She is not on anticoagulation PTA.   Goal of Therapy:  Heparin  level 0.3-0.7 units/ml Monitor platelets by anticoagulation protocol: Yes   Plan:  Heparin  bolus 3800 units IV x 1 Heparin  gtt 800 units/hr Check an 8 hr heparin  level Daily heparin  level and CBC  Yan Pankratz, Vernell Helling 04/16/2023,2:26 PM

## 2023-04-16 NOTE — Progress Notes (Signed)
 PHARMACY - ANTICOAGULATION CONSULT NOTE  Pharmacy Consult for heparin  Indication: chest pain/ACS  Allergies  Allergen Reactions   Ace Inhibitors     angioedema   Lactose Intolerance (Gi) Other (See Comments)    GI upset   Letrozole  Hives   Lisinopril Swelling   Mercury     Red eyes, through eye drops that contained thimerosal    Tape Dermatitis    plastic    Patient Measurements: Height: 5' 2 (157.5 cm) Weight: 66.9 kg (147 lb 7.8 oz) IBW/kg (Calculated) : 50.1 Heparin  Dosing Weight: 63kg  Vital Signs: Temp: 98.3 F (36.8 C) (01/06 2236) Temp Source: Oral (01/06 2236) BP: 131/60 (01/06 2325) Pulse Rate: 92 (01/06 2350)  Labs: Recent Labs    04/16/23 1230 04/16/23 1427 04/16/23 2243  HGB 9.5*  --   --   HCT 28.4*  --   --   PLT 182  --   --   HEPARINUNFRC  --   --  0.34  CREATININE 6.24*  --   --   TROPONINIHS 5,456* 4,458*  --     Estimated Creatinine Clearance: 6.6 mL/min (A) (by C-G formula based on SCr of 6.24 mg/dL (H)).   Assessment: 81 yof presented to the ED with SOB. Troponin elevated and now starting IV heparin . Baseline Hgb is low at 9.5 but appears to be her baseline and platelets are WNL. She is not on anticoagulation PTA.   1/6 PM: heparin  level returned at 0.34 on 800 units/hr. No issues with the heparin  infusion running continuously or signs/symptoms of bleeding reported  Goal of Therapy:  Heparin  level 0.3-0.7 units/ml Monitor platelets by anticoagulation protocol: Yes   Plan:  Heparin  gtt 800 units/hr Check an 8 hr heparin  level Daily heparin  level and CBC  Lynwood Poplar, PharmD. Clinical Pharmacist 04/16/2023 11:55 PM

## 2023-04-16 NOTE — Plan of Care (Signed)
   Problem: Education: Goal: Knowledge of General Education information will improve Description Including pain rating scale, medication(s)/side effects and non-pharmacologic comfort measures Outcome: Progressing   Problem: Activity: Goal: Risk for activity intolerance will decrease Outcome: Progressing   Problem: Safety: Goal: Ability to remain free from injury will improve Outcome: Progressing

## 2023-04-16 NOTE — ED Notes (Signed)
 Went to CT on monitor with nurse

## 2023-04-16 NOTE — ED Provider Notes (Signed)
  Physical Exam  BP (!) 146/70   Pulse (!) 107   Temp 98.1 F (36.7 C)   Resp (!) 23   SpO2 98%     Procedures  .Critical Care  Performed by: Jerrol Agent, MD Authorized by: Jerrol Agent, MD   Critical care provider statement:    Critical care time (minutes):  30   Critical care was time spent personally by me on the following activities:  Development of treatment plan with patient or surrogate, discussions with consultants, evaluation of patient's response to treatment, examination of patient, ordering and review of laboratory studies, ordering and review of radiographic studies, ordering and performing treatments and interventions, pulse oximetry, re-evaluation of patient's condition and review of old charts   Care discussed with: admitting provider     ED Course / MDM   Clinical Course as of 04/16/23 1822  Mon Apr 16, 2023  1330 Influenza A By PCR(!): POSITIVE [SG]  1427 B Natriuretic Peptide(!): 3,699.7 She does not appear overtly volume overloaded on exam.  Reports her weight has been stable over the past week, last HD was Saturday [SG]  1525 Concern for PE, she has been on HD <6 mos, RT requesting d/w nephro for clearance [SG]  1554 Spoke with Dr Jerrye nephrology, okay to get CTPE [SG]    Clinical Course User Index [SG] Elnor Jayson LABOR, DO   Medical Decision Making Amount and/or Complexity of Data Reviewed Labs: ordered. Decision-making details documented in ED Course. Radiology: ordered.  Risk OTC drugs. Prescription drug management. Decision regarding hospitalization.   65F, presenting flu positive, NSTEMI with downtrending trops but in the thousands, on Heparin , ESRD on HD TuThSat last dialyzed on Sat waiting on PE study then needs admission. On 2L stable.    CTA PE: IMPRESSION:  1. No evidence of significant pulmonary embolus.  2. Mild cardiac enlargement.  3. Aortic atherosclerosis.  4. Small bilateral pleural effusions.  5. Patchy airspace disease  throughout both lungs with bronchial wall  thickening likely representing multifocal pneumonia, possibly  aspiration, possibly with underlying airways disease.    ETA without evidence of PE.  Hemodynamically stable on 2 L O2 oxygen supplementation for hypoxic respiratory failure, on heparin  for concern for NSTEMI, flu positive, hospitalist medicine consulted for admission, Dr. Franky accepting.    Jerrol Agent, MD 04/16/23 [phone]

## 2023-04-16 NOTE — ED Triage Notes (Signed)
 Pt reports hx of asthma, was referred by pcp, endorses "cold like symptoms" last week. Pt reports shob x 3 days, worse with ambulation. Used albuterol 1 hr pta. Last dialysis was sat. Pt speaking in complete sentences, some shob after ambulation noted

## 2023-04-16 NOTE — ED Notes (Signed)
 Attempt to call report.  Floor RN not available.  States will call back.

## 2023-04-16 NOTE — Progress Notes (Signed)
 Plan of Care Note for accepted transfer   Patient: Rachel Villarreal MRN: [id number]   DOA: 04/16/2023  Facility requesting transfer: [facility]  ER. Requesting Provider: Dr. Jerrol. Reason for transfer: Non-ST elevation MI. Facility course: 78 year old female with history of ESRD on hemodialysis, hypertension presents to the ER because of shortness of breath.  Denies any chest pain.  CT angiogram of the chest shows multifocal pneumonia.  Patient came back positive for influenza A.  Troponins were elevated at 5400 and repeat was 4400.  EKG shows nonspecific changes.  ER physician discussed with Dr. Michele cardiologist on-call and patient was started on heparin  and admitted for non-ST ovation MI and also influenza A with multifocal pneumonia.  Plan of care: The patient is accepted for admission to Telemetry unit, at [facility].  Author: Redia LOISE Cleaver, MD 04/16/2023  Check [email or web address] for on-call coverage.  Nursing staff, Please call [facility] Admits & Consults System-Wide number on Amion as soon as patient's arrival, so appropriate admitting provider can evaluate the pt.

## 2023-04-16 NOTE — Consult Note (Addendum)
 Cardiology Consultation   Patient ID: Rachel Villarreal MRN: [id number]; DOB: 12-Apr-1945  Admit date: 04/16/2023 Date of Consult: 04/16/2023  PCP:  Ransom Other, MD   [facility] Providers Cardiologist:  None        Patient Profile:   Rachel Villarreal is a 78 y.o. female with a hx of ESRD on IHD, HTN, HLD, prior breast CA, gout, and GERD who is being seen 04/16/2023 for the evaluation of elevated troponins at the request of Dr. Jerrol.   History of Present Illness:   Ms. Rachel Villarreal reports that approximately 1 week ago she developed URI symptoms consisting of rhinorrhea, cough and SOB.  Over the course of the week her SOB worsened and she developed associated wheezing as well.  She denies chest pain, PND, orthopnea, swelling, palpitations, presyncope, or syncope.  She denies fever as well.  She reported her SOB symptoms to her physician prompted her to come to the ED.  She initially presented to the [facility] where she was found to be hypoxic and placed on 2 L O2 nasal cannula.  CT PE was negative for PE, but there was mild cardiac enlargement.  ECG initially showed NSR with PVCs.  Subsequent EKG showed atrial flutter with RVR.  Labs most notable for troponin 5456 -> 4,458, BNP 3699.  Respiratory viral panel revealed influenza A positivity.  She was started on heparin  and transferred to [facility] given concern for NSTEMI.   Past Medical History:  Diagnosis Date   Abnormal Pap smear of vagina    Anemia    Asthma    Mild   Cancer (HCC)    left breast ILC   Chronic kidney disease (CKD)    stage 4   GERD (gastroesophageal reflux disease)    Gout 05/2012   Hernia, incisional    History of abnormal Pap smear    CIN III, 2001   History of blood transfusion    POST OP   History of uterine fibroid    Hypertension    Peritonitis (HCC)    Respiratory failure (HCC)    after ruptured appendix   Ruptured appendix    Seasonal allergies    Shingles 02/2013   very mild case    Urine incontinence     Past Surgical History:  Procedure Laterality Date   A/V FISTULAGRAM Right 03/19/2023   Procedure: A/V Fistulagram;  Surgeon: Sheree Penne Bruckner, MD;  Location: [facility] INVASIVE CV LAB;  Service: Cardiovascular;  Laterality: Right;   APPENDECTOMY     AV FISTULA PLACEMENT Right 04/24/2022   Procedure: RIGHT BRACHIOCEPHALIC ARTERIOVENOUS (AV) FISTULA CREATION;  Surgeon: Eliza Bruckner RAMAN, MD;  Location: [facility] OR;  Service: Vascular;  Laterality: Right;   BREAST BIOPSY Right    fibrocystic changes   BREAST BIOPSY Left 08/02/2021   BREAST LUMPECTOMY WITH RADIOACTIVE SEED LOCALIZATION Left 09/12/2021   Procedure: LEFT BREAST LUMPECTOMY WITH RADIOACTIVE SEED LOCALIZATION;  Surgeon: Ebbie Cough, MD;  Location: [facility];  Service: General;  Laterality: Left;   CATARACT EXTRACTION Bilateral    COLONOSCOPY     COLONOSCOPY WITH PROPOFOL  N/A 02/18/2018   Procedure: COLONOSCOPY WITH PROPOFOL ;  Surgeon: Rosalie Kitchens, MD;  Location: [facility] ENDOSCOPY;  Service: Endoscopy;  Laterality: N/A;  Use ultraslim scope    FISTULA SUPERFICIALIZATION Right 04/13/2023   Procedure: FISTULA SUPERFICIALIZATION;  Surgeon: Sheree Penne Bruckner, MD;  Location: [facility] OR;  Service: Vascular;  Laterality: Right;   IR FLUORO GUIDE CV LINE RIGHT  03/10/2023  LEEP  2000   CIN 2/3   RE-EXCISION OF BREAST LUMPECTOMY Left 09/29/2021   Procedure: LEFT  BREAST RE-EXCISION LUMPECTOMY;  Surgeon: Ebbie Cough, MD;  Location: [facility] ORS;  Service: General;  Laterality: Left;   REFRACTIVE SURGERY     SUPRACERVICAL ABDOMINAL HYSTERECTOMY  2001     Home Medications:  Prior to Admission medications   Medication Sig Start Date End Date Taking? Authorizing Provider  eszopiclone (LUNESTA) 1 MG TABS tablet Take 1 mg by mouth at bedtime as needed for sleep. Take immediately before bedtime   Yes [provider]  acetaminophen  (TYLENOL ) 500 MG tablet Take 500 mg by mouth every 6 (six) hours  as needed for mild pain (pain score 1-3).    [provider]  amLODipine  (NORVASC ) 5 MG tablet Take 5 mg by mouth in the morning. 02/15/23   [provider]  anastrozole  (ARIMIDEX ) 1 MG tablet Take 1 tablet (1 mg total) by mouth daily. 09/07/22   Gudena, Vinay, MD  atorvastatin  (LIPITOR ) 10 MG tablet Take 10 mg by mouth in the morning.    [provider]  budesonide  (RHINOCORT  AQUA) 32 MCG/ACT nasal spray Place 2 sprays into both nostrils in the morning. 12/12/22   [provider]  Cholecalciferol 50 MCG (2000 UT) CAPS Take 2,000 Units by mouth in the morning. 12/27/22   [provider]  diclofenac  Sodium (VOLTAREN ) 1 % GEL Apply 1 Application topically 4 (four) times daily as needed (pain.).    [provider]  febuxostat  (ULORIC ) 40 MG tablet Take 40 mg by mouth in the morning.    [provider]  furosemide  (LASIX ) 40 MG tablet Take 80 mg by mouth 4 (four) times a week. Take 2 tablets (80 mg) by mouth once daily on non-dialysis days Mondays, Wednesdays, Fridays & Sundays. 02/24/23   [provider]  loratadine  (CLARITIN ) 10 MG tablet Take 10 mg by mouth in the morning and at bedtime. 12/12/22   [provider]  losartan  (COZAAR ) 100 MG tablet Take 100 mg by mouth in the morning. 07/27/21   [provider]  melatonin 5 MG TABS Take 1 tablet (5 mg total) by mouth at bedtime as needed. Patient taking differently: Take 10 mg by mouth at bedtime. 08/29/21   Gudena, Vinay, MD  metoprolol  succinate (TOPROL -XL) 50 MG 24 hr tablet Take 50 mg by mouth at bedtime. At bedtime    [provider]  Multiple Vitamins-Minerals (PRESERVISION AREDS 2+MULTI VIT PO) Take 1 capsule by mouth in the morning and at bedtime.    [provider]  oxyCODONE -acetaminophen  (PERCOCET/ROXICET) 5-325 MG tablet Take 1 tablet by mouth every 6 (six) hours as needed for severe pain (pain score 7-10). 04/13/23   Gerome Maurilio HERO, PA-C   pantoprazole  (PROTONIX ) 40 MG tablet Take 40 mg by mouth daily.    [provider]  PROAIR  HFA 108 (90 BASE) MCG/ACT inhaler Inhale 1-2 puffs into the lungs every 6 (six) hours as needed for shortness of breath or wheezing (for respiratory issues.). 12/03/14   [provider]  sevelamer  (RENAGEL ) 800 MG tablet Take 800 mg by mouth 3 (three) times daily with meals. 03/05/23   [provider]    Inpatient Medications: Scheduled Meds:  albuterol   5 mg Nebulization Once   Continuous Infusions:  heparin  800 Units/hr (04/16/23 1449)   PRN Meds:   Allergies:    Allergies  Allergen Reactions   Ace Inhibitors     angioedema  Lactose Intolerance (Gi) Other (See Comments)    GI upset   Letrozole  Hives   Lisinopril Swelling   Mercury     Red eyes, through eye drops that contained thimerosal    Tape Dermatitis    plastic    Social History:   Social History   Socioeconomic History   Marital status: Divorced    Spouse name: Not on file   Number of children: Not on file   Years of education: Not on file   Highest education level: Not on file  Occupational History   Not on file  Tobacco Use   Smoking status: Former    Current packs/day: 0.00    Types: Cigarettes    Quit date: 04/10/1972    Years since quitting: 51.0   Smokeless tobacco: Never  Vaping Use   Vaping status: Never Used  Substance and Sexual Activity   Alcohol  use: No   Drug use: No   Sexual activity: Not Currently    Partners: Male    Birth control/protection: Surgical    Comment: hysterectomy  Other Topics Concern   Not on file  Social History Narrative   Not on file   Social Drivers of Health   Financial Resource Strain: Low Risk  (09/02/2021)   Overall Financial Resource Strain (CARDIA)    Difficulty of Paying Living Expenses: Not hard at all  Food Insecurity: No Food Insecurity (03/10/2023)   Hunger Vital Sign    Worried About Running Out of Food in the Last Year: Never  true    Ran Out of Food in the Last Year: Never true  Transportation Needs: No Transportation Needs (03/10/2023)   PRAPARE - Administrator, Civil Service (Medical): No    Lack of Transportation (Non-Medical): No  Physical Activity: Not on file  Stress: No Stress Concern Present (09/02/2021)   [employer] of Occupational Health - Occupational Stress Questionnaire    Feeling of Stress : Not at all  Social Connections: Not on file  Intimate Partner Violence: Not At Risk (03/10/2023)   Humiliation, Afraid, Rape, and Kick questionnaire    Fear of Current or Ex-Partner: No    Emotionally Abused: No    Physically Abused: No    Sexually Abused: No    Family History:    Family History  Problem Relation Age of Onset   Colon cancer Father    Breast cancer Neg Hx      ROS:  Please see the history of present illness.  All other ROS reviewed and negative.     Physical Exam/Data:   Vitals:   04/16/23 1622 04/16/23 2037 04/16/23 2151 04/16/23 2236  BP: (!) 138/47  134/74 121/87  Pulse: (!) 109  (!) 103 (!) 169  Resp: (!) 23  (!) 21 19  Temp:  98.4 F (36.9 C) 98.3 F (36.8 C) 98.3 F (36.8 C)  TempSrc:  Oral Oral Oral  SpO2: 98%  100% 93%  Weight:    66.9 kg  Height:    5' 2 (1.575 m)   No intake or output data in the 24 hours ending 04/16/23 2249    04/16/2023   10:36 PM 04/13/2023    7:32 AM 03/19/2023    6:06 AM  Last 3 Weights  Weight (lbs) 147 lb 7.8 oz 141 lb 140 lb  Weight (kg) 66.9 kg 63.957 kg 63.504 kg     Body mass index is 26.98 kg/m.  General:  Well nourished, well developed, in no  acute distress, very pleasant HEENT: Atraumatic, normocephalic Neck: JVD to mid neck Vascular: No carotid bruits; Distal pulses 2+ bilaterally Cardiac: Tachycardic with irregularly irregular rhythm, normal S1/S2, no M/R/G Lungs: Diffuse rhonchi and wheezes heard throughout, no clear Rales Abd: soft, nontender, no hepatomegaly  Ext: no edema Musculoskeletal:  No  deformities, BUE and BLE strength normal and equal Skin: warm and dry  Neuro:  CNs 2-12 intact, no focal abnormalities noted Psych:  Normal affect   EKG:  The EKG was personally reviewed and demonstrates: A flutter with RVR variable response     Telemetry:  Telemetry was personally reviewed and demonstrates: Atrial flutter with RVR  Relevant CV Studies: N/A  Laboratory Data:  High Sensitivity Troponin:   Recent Labs  Lab 04/16/23 1230 04/16/23 1427  TROPONINIHS 5,456* 4,458*     Chemistry Recent Labs  Lab 04/13/23 0821 04/16/23 1230  NA 140 138  K 3.8 3.8  CL 102 97*  CO2  --  28  GLUCOSE 97 124*  BUN 23 48*  CREATININE 4.10* 6.24*  CALCIUM   --  9.1  GFRNONAA  --  6*  ANIONGAP  --  13    No results for input(s): PROT, ALBUMIN , AST, ALT, ALKPHOS, BILITOT in the last 168 hours. Lipids No results for input(s): CHOL, TRIG, HDL, LABVLDL, LDLCALC, CHOLHDL in the last 168 hours.  Hematology Recent Labs  Lab 04/13/23 0821 04/16/23 1230  WBC  --  7.8  RBC  --  2.98*  HGB 9.9* 9.5*  HCT 29.0* 28.4*  MCV  --  95.3  MCH  --  31.9  MCHC  --  33.5  RDW  --  13.9  PLT  --  182   Thyroid  No results for input(s): TSH, FREET4 in the last 168 hours.  BNP Recent Labs  Lab 04/16/23 1230  BNP 3,699.7*    DDimer No results for input(s): DDIMER in the last 168 hours.   Radiology/Studies:  CT Angio Chest PE W and/or Wo Contrast Result Date: 04/16/2023 CLINICAL DATA:  Pulmonary embolus suspected with high probability. History of asthma. Cold-like symptoms for a week. Shortness of breath for 3 days. EXAM: CT ANGIOGRAPHY CHEST WITH CONTRAST TECHNIQUE: Multidetector CT imaging of the chest was performed using the standard protocol during bolus administration of intravenous contrast. Multiplanar CT image reconstructions and MIPs were obtained to evaluate the vascular anatomy. RADIATION DOSE REDUCTION: This exam was performed according to the  departmental dose-optimization program which includes automated exposure control, adjustment of the mA and/or kV according to patient size and/or use of iterative reconstruction technique. CONTRAST:  75mL OMNIPAQUE  IOHEXOL  350 MG/ML SOLN COMPARISON:  CT chest abdomen and pelvis 11/23/2022 FINDINGS: Cardiovascular: Technically adequate study with moderately good opacification of the central and segmental pulmonary arteries. Mild motion artifact. No focal filling defects are identified. No evidence of significant pulmonary embolus. Right central venous catheter with tip in the low SVC. Mild cardiac enlargement. No pericardial effusions. Normal caliber thoracic aorta. No aortic dissection. Calcification of the aorta and coronary arteries. Mediastinum/Nodes: Thyroid  gland is unremarkable. Esophagus is decompressed. Mediastinal lymph nodes are not pathologically enlarged, likely reactive. Lungs/Pleura: Small bilateral pleural effusions with basilar atelectasis. Patchy airspace disease throughout both lungs with bronchial wall thickening likely representing multifocal pneumonia or possibly aspiration. Underlying airways disease may be present. Upper Abdomen: No acute abnormalities demonstrated. Musculoskeletal: Degenerative changes in the spine. No acute bony abnormalities. Review of the MIP images confirms the above findings. IMPRESSION: 1. No evidence of significant pulmonary  embolus. 2. Mild cardiac enlargement. 3. Aortic atherosclerosis. 4. Small bilateral pleural effusions. 5. Patchy airspace disease throughout both lungs with bronchial wall thickening likely representing multifocal pneumonia, possibly aspiration, possibly with underlying airways disease. Electronically Signed   By: Elsie Gravely M.D.   On: 04/16/2023 18:11   DG Chest 2 View Result Date: 04/16/2023 CLINICAL DATA:  Shortness of breath for 3 days, worse with ambulation. Cold-like symptoms last week. Hemodialysis patient. EXAM: CHEST - 2 VIEW  COMPARISON:  Radiographs 08/23/2022 and 12/21/2020.  CT 11/23/2022. FINDINGS: Right IJ hemodialysis catheter projects over the upper right atrium. The heart size and mediastinal contours are stable. Increased mildly heterogeneous perihilar opacities in both lungs compared with prior radiographs. No confluent airspace disease or significant pleural effusion identified. There is chronic scarring in the lingula. No pneumothorax. The bones appear unchanged. IMPRESSION: Increased mildly heterogeneous perihilar opacities in both lungs, possibly mild edema or atypical infection. No confluent airspace disease or significant pleural effusion. Short-term radiographic follow-up recommended if the patient remains symptomatic. Electronically Signed   By: Elsie Perone M.D.   On: 04/16/2023 13:40     Assessment and Plan:   Rachel Villarreal is a 78 y.o. female with a hx of ESRD on IHD, HTN, HLD, prior breast CA, gout, and GERD who is being seen 04/16/2023 for the evaluation of elevated troponins at the request of Dr. Jerrol.   #Elevated Troponins Likely Myocardial Injury #New Atrial Flutter/Afib with RVR :: Patient presented with acute hypoxic respiratory failure in the setting of being influenza A positive.  Cardiac workup revealed markedly elevated troponins.  She has no cardiac history or major risk factors for from age.  She has never had any chest pain and felt fine prior to contracting influenza A.  I suspect her elevated troponins are secondary to myocardial injury related to her acute viral illness.  There are no ischemic changes on EKG or ischemic symptoms to suggest an active MI.  Given the degree of troponin elevation in the CAC seen on her CT PE however, I think is reasonable to treat her with heparin  and aspirin  while she recovers from her pulmonary infection with a later evaluation for coronary artery disease.  She also developed new onset a flutter/A-fib with RVR which is putting further stress on her  heart. -Continue heparin  per pharmacy -Continue aspirin  81 mg daily -Complete echo -Maintain telemetry -Start metoprolol  tartrate 25 mg every 6 hr for rate control of a flutter -She appears volume up.  Recommend consultation to nephrology for dialysis. -Lipid panel, A1c, LPA, TSH -Will need to consider ischemic evaluation likely prior to discharge with either LHC versus CTA  #ESRD -Consults nephrology for dialysis   Risk Assessment/Risk Scores:     TIMI Risk Score for Unstable Angina or Non-ST Elevation MI:   The patient's TIMI risk score is 3, which indicates a 13% risk of all cause mortality, new or recurrent myocardial infarction or need for urgent revascularization in the next 14 days.    CHA2DS2-VASc Score = 3  This indicates a 3.2% annual risk of stroke. The patient's score is based upon:           For questions or updates, please contact  [facility] Please consult [email or web address] for contact info under    Signed, Georganna Archer, MD  04/16/2023 10:49 PM   Personally seen and examined. Agree with Fellow above with the following comments:  Miss Maragh, a 78 year old with a history of end-stage renal disease on hemodialysis,  hypertension, hyperlipidemia, and prior breast cancer (ER positive, PR positive, HER2 negative from 2023) , presented with a notable NSTEMI. The patient was also found to be flu positive and had upper respiratory infection symptoms including a cough and shortness of breath. However, she denied experiencing chest pain or shortness of breath prior to the emergency room visit. The patient was given Tamiflu  for symptom reduction and did not experience any nausea with that. She was also on Metoprolol  for her blood pressure. The patient's ferritin level in 2024 was 400.   The patient has significant risk factors for coronary artery disease, including end-stage renal disease and hyperlipidemia. She was on Verzenio  and then on Lestrozole, and presently  on Anastrozole  for breast cancer treatment. She did not have QT prolongation on the Verzenio . The patient was pleasantly surprised that she had never needed a cardiologist in the past. She is a former [employer].  The patient was found to have atrial flutter and a troponin level of 5,400 that decreased to 4,458. 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  A CT pulmonary embolism was performed, showing small pleural effusions with bibasilar atelectasis, patchy airspace disease bilaterally that could represent multifocal pneumonia, and aortic atherosclerosis with subclavian calcifications. The patient did not have significant coronary calcifications in her left main left circ LAD.   General:  Well nourished, well developed, in no acute distress at 30 degrees Neck: + JVD Vascular: No carotid bruits; Distal pulses 2+ bilaterally Cardiac: Regular tachycardia normal S1/S2, no M/R/G Lungs: Diffuse rhonchi and wheezes heard throughout, decreased breath sounds in bases, no tachypnea Abd: soft, nontender, non distended Ext: no edema Rest as above  LABS Troponin: 5400 (04/17/2023) Ferritin: 400 (03/2023)  RADIOLOGY CT Pulmonary Embolism: Small pleural effusions, bibasilar atelectasis, patchy airspace disease bilaterally, multifocal pneumonia, aortic atherosclerosis, subclavian calcifications (04/17/2023)  NSTEMI- Type III suspected Non-ST-elevation myocardial infarction (NSTEMI) with a troponin level of 5400, which decreased to 4,458. Significant risk factors include end-stage renal disease, hyperlipidemia, and a history of breast cancer. Presently in sinus rhythm with frequent PVCs. No chest pain reported. Discussed potential need for left heart  catheterization or cardiac CT based on echocardiogram and clinical status. Informed consent included discussion of risks such as bleeding, infection, and contrast-induced nephropathy, benefits of accurate diagnosis and treatment planning, and alternatives including non-invasive imaging. - Continue heparin  and aspirin  - Complete echocardiogram - Discuss cardiac CT or left heart catheterization based on echocardiogram and clinical status  Atrial Flutter Atrial flutter with variable conduction, closer to 2:1 conduction. Heart rates are relatively controlled. Converted to sinus rhythm with frequent PVCs at 4:18 AM. Discussed risks of atrial flutter including stroke and heart failure, benefits of rate control, and alternatives such as ablation or antiarrhythmic drugs. - Attempt higher dose of metoprolol  given blood pressure of 142/74 - Consolidate to metoprolol  succinate peri-discharge  Viral Multifocal Pneumonia Bilateral small pleural effusions, bibasilar atelectasis, and patchy airspace disease on CT, likely representing viral multifocal pneumonia. Currently on Tamiflu  for symptom reduction. Discussed risks of pneumonia including respiratory failure, benefits of antiviral treatment, and alternatives such as supportive care. - Continue Tamiflu   End-Stage Renal Disease End-stage renal disease on hemodialysis. Discussed risks of dialysis including infection and hypotension, benefits of fluid and electrolyte  balance, and alternatives such as conservative management. - Continue dialysis  Hypertension Hypertension with current blood pressure of 142/74. BB as above   Hyperlipidemia and aortic atherosclerosis Hyperlipidemia, a significant risk factor for coronary artery disease.  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 Currently on anastrozole . No cardiotoxic chemotherapy noted  Baseline  Anemia Baseline anemia, likely related to breast cancer therapy and anemia of chronic disease. Ferritin level was 400 in 2024. - no changes in therapy   Will establish with me going forward.  Stanly Leavens, MD FASE [facility] Cardiologist [facility]  [address] [place], [address] [place], [place] [address] [phone]  9:59 AM

## 2023-04-17 ENCOUNTER — Inpatient Hospital Stay (HOSPITAL_COMMUNITY): Payer: Medicare Other

## 2023-04-17 DIAGNOSIS — I480 Paroxysmal atrial fibrillation: Secondary | ICD-10-CM | POA: Diagnosis not present

## 2023-04-17 DIAGNOSIS — J09X1 Influenza due to identified novel influenza A virus with pneumonia: Secondary | ICD-10-CM | POA: Diagnosis present

## 2023-04-17 DIAGNOSIS — N186 End stage renal disease: Secondary | ICD-10-CM

## 2023-04-17 DIAGNOSIS — I214 Non-ST elevation (NSTEMI) myocardial infarction: Secondary | ICD-10-CM | POA: Diagnosis not present

## 2023-04-17 DIAGNOSIS — I10 Essential (primary) hypertension: Secondary | ICD-10-CM

## 2023-04-17 LAB — CBC
HCT: 28.4 % — ABNORMAL LOW (ref 36.0–46.0)
Hemoglobin: 9.5 g/dL — ABNORMAL LOW (ref 12.0–15.0)
MCH: 32 pg (ref 26.0–34.0)
MCHC: 33.5 g/dL (ref 30.0–36.0)
MCV: 95.6 fL (ref 80.0–100.0)
Platelets: 208 10*3/uL (ref 150–400)
RBC: 2.97 MIL/uL — ABNORMAL LOW (ref 3.87–5.11)
RDW: 13.8 % (ref 11.5–15.5)
WBC: 7.5 10*3/uL (ref 4.0–10.5)
nRBC: 0 % (ref 0.0–0.2)

## 2023-04-17 LAB — ECHOCARDIOGRAM COMPLETE
AR max vel: 1.58 cm2
AV Area VTI: 1.73 cm2
AV Area mean vel: 1.62 cm2
AV Mean grad: 7 mm[Hg]
AV Peak grad: 15.5 mm[Hg]
Ao pk vel: 1.97 m/s
Area-P 1/2: 4.89 cm2
Calc EF: 54.1 %
Height: 62 in
S' Lateral: 3.7 cm
Single Plane A2C EF: 60.1 %
Single Plane A4C EF: 47.2 %
Weight: 2359.8 [oz_av]

## 2023-04-17 LAB — PROCALCITONIN: Procalcitonin: 3.18 ng/mL

## 2023-04-17 LAB — COMPREHENSIVE METABOLIC PANEL
ALT: <5 U/L (ref 0–44)
AST: 40 U/L (ref 15–41)
Albumin: 2.6 g/dL — ABNORMAL LOW (ref 3.5–5.0)
Alkaline Phosphatase: 71 U/L (ref 38–126)
Anion gap: 20 — ABNORMAL HIGH (ref 5–15)
BUN: 49 mg/dL — ABNORMAL HIGH (ref 8–23)
CO2: 19 mmol/L — ABNORMAL LOW (ref 22–32)
Calcium: 8.6 mg/dL — ABNORMAL LOW (ref 8.9–10.3)
Chloride: 95 mmol/L — ABNORMAL LOW (ref 98–111)
Creatinine, Ser: 6.77 mg/dL — ABNORMAL HIGH (ref 0.44–1.00)
GFR, Estimated: 6 mL/min — ABNORMAL LOW (ref >60)
Glucose, Bld: 81 mg/dL (ref 70–99)
Potassium: 3.9 mmol/L (ref 3.5–5.1)
Sodium: 134 mmol/L — ABNORMAL LOW (ref 135–145)
Total Bilirubin: 1.6 mg/dL — ABNORMAL HIGH (ref 0.0–1.2)
Total Protein: 5.4 g/dL — ABNORMAL LOW (ref 6.5–8.1)

## 2023-04-17 LAB — HEPARIN LEVEL (UNFRACTIONATED): Heparin Unfractionated: 0.54 [IU]/mL (ref 0.30–0.70)

## 2023-04-17 LAB — HEPATITIS B SURFACE ANTIGEN: Hepatitis B Surface Ag: NONREACTIVE

## 2023-04-17 LAB — LIPID PANEL
Cholesterol: 137 mg/dL (ref 0–200)
HDL: 36 mg/dL — ABNORMAL LOW (ref >40)
LDL Cholesterol: 79 mg/dL (ref 0–99)
Total CHOL/HDL Ratio: 3.8 {ratio}
Triglycerides: 108 mg/dL (ref <150)
VLDL: 22 mg/dL (ref 0–40)

## 2023-04-17 LAB — TSH: TSH: 0.468 u[IU]/mL (ref 0.350–4.500)

## 2023-04-17 LAB — LACTIC ACID, PLASMA: Lactic Acid, Venous: 1 mmol/L (ref 0.5–1.9)

## 2023-04-17 LAB — MRSA NEXT GEN BY PCR, NASAL: MRSA by PCR Next Gen: DETECTED — AB

## 2023-04-17 LAB — MAGNESIUM: Magnesium: 2.1 mg/dL (ref 1.7–2.4)

## 2023-04-17 MED ORDER — CHLORHEXIDINE GLUCONATE CLOTH 2 % EX PADS
6.0000 | MEDICATED_PAD | Freq: Every day | CUTANEOUS | Status: DC
  Administered 2023-04-18 – 2023-04-20 (×3): 6 via TOPICAL

## 2023-04-17 MED ORDER — ACETAMINOPHEN 325 MG PO TABS
650.0000 mg | ORAL_TABLET | Freq: Four times a day (QID) | ORAL | Status: DC | PRN
  Administered 2023-04-22 – 2023-04-24 (×5): 650 mg via ORAL
  Filled 2023-04-17 (×5): qty 2

## 2023-04-17 MED ORDER — LIDOCAINE-PRILOCAINE 2.5-2.5 % EX CREA: 1.0000 | TOPICAL_CREAM | CUTANEOUS | Status: DC | PRN

## 2023-04-17 MED ORDER — CHLORHEXIDINE GLUCONATE CLOTH 2 % EX PADS
6.0000 | MEDICATED_PAD | Freq: Every day | CUTANEOUS | Status: AC
  Administered 2023-04-16: 6 via TOPICAL

## 2023-04-17 MED ORDER — AZITHROMYCIN 500 MG PO TABS
500.0000 mg | ORAL_TABLET | Freq: Every day | ORAL | Status: DC
  Administered 2023-04-17 – 2023-04-18 (×2): 500 mg via ORAL
  Filled 2023-04-17 (×2): qty 1

## 2023-04-17 MED ORDER — HEPARIN SODIUM (PORCINE) 1000 UNIT/ML DIALYSIS
1000.0000 [IU] | INTRAMUSCULAR | Status: DC | PRN
  Administered 2023-04-18: 3200 [IU]
  Filled 2023-04-17 (×3): qty 1

## 2023-04-17 MED ORDER — METOPROLOL TARTRATE 25 MG PO TABS
37.5000 mg | ORAL_TABLET | Freq: Four times a day (QID) | ORAL | Status: AC
  Administered 2023-04-17 – 2023-04-20 (×9): 37.5 mg via ORAL
  Filled 2023-04-17 (×11): qty 1

## 2023-04-17 MED ORDER — ATORVASTATIN CALCIUM 10 MG PO TABS
10.0000 mg | ORAL_TABLET | Freq: Every morning | ORAL | Status: DC
  Administered 2023-04-18: 10 mg via ORAL
  Filled 2023-04-17: qty 1

## 2023-04-17 MED ORDER — SEVELAMER CARBONATE 800 MG PO TABS
800.0000 mg | ORAL_TABLET | Freq: Three times a day (TID) | ORAL | Status: DC
  Administered 2023-04-18 – 2023-04-25 (×13): 800 mg via ORAL
  Filled 2023-04-17 (×17): qty 1

## 2023-04-17 MED ORDER — PENTAFLUOROPROP-TETRAFLUOROETH EX AERO: 1.0000 | INHALATION_SPRAY | CUTANEOUS | Status: DC | PRN

## 2023-04-17 MED ORDER — ONDANSETRON HCL 4 MG/2ML IJ SOLN
4.0000 mg | Freq: Four times a day (QID) | INTRAMUSCULAR | Status: DC | PRN
  Administered 2023-04-19: 4 mg via INTRAVENOUS
  Filled 2023-04-17: qty 2

## 2023-04-17 MED ORDER — ALTEPLASE 2 MG IJ SOLR
2.0000 mg | Freq: Once | INTRAMUSCULAR | Status: DC | PRN
Start: 2023-04-17 — End: 2023-04-18

## 2023-04-17 MED ORDER — HEPARIN SODIUM (PORCINE) 1000 UNIT/ML DIALYSIS
3000.0000 [IU] | Freq: Once | INTRAMUSCULAR | Status: AC
  Administered 2023-04-17: 3000 [IU] via INTRAVENOUS_CENTRAL
  Filled 2023-04-17: qty 3

## 2023-04-17 MED ORDER — ACETAMINOPHEN 650 MG RE SUPP: 650.0000 mg | Freq: Four times a day (QID) | RECTAL | Status: DC | PRN

## 2023-04-17 MED ORDER — DOCUSATE SODIUM 100 MG PO CAPS
100.0000 mg | ORAL_CAPSULE | Freq: Two times a day (BID) | ORAL | Status: DC
  Administered 2023-04-17 – 2023-04-19 (×6): 100 mg via ORAL
  Filled 2023-04-17 (×6): qty 1

## 2023-04-17 MED ORDER — DICLOFENAC SODIUM 1 % EX GEL
1.0000 | Freq: Four times a day (QID) | CUTANEOUS | Status: DC | PRN
  Administered 2023-04-17 (×2): 1 via TOPICAL
  Filled 2023-04-17: qty 100

## 2023-04-17 MED ORDER — ASPIRIN 81 MG PO TBEC
81.0000 mg | DELAYED_RELEASE_TABLET | Freq: Every day | ORAL | Status: DC
  Administered 2023-04-17 – 2023-04-25 (×8): 81 mg via ORAL
  Filled 2023-04-17 (×9): qty 1

## 2023-04-17 MED ORDER — PERFLUTREN LIPID MICROSPHERE
1.0000 mL | INTRAVENOUS | Status: AC | PRN
  Administered 2023-04-17: 3 mL via INTRAVENOUS

## 2023-04-17 MED ORDER — ALBUTEROL SULFATE (2.5 MG/3ML) 0.083% IN NEBU
2.5000 mg | INHALATION_SOLUTION | Freq: Four times a day (QID) | RESPIRATORY_TRACT | Status: DC | PRN
  Administered 2023-04-17 (×2): 2.5 mg via RESPIRATORY_TRACT
  Filled 2023-04-17 (×2): qty 3

## 2023-04-17 MED ORDER — HEPARIN SODIUM (PORCINE) 1000 UNIT/ML DIALYSIS
1500.0000 [IU] | INTRAMUSCULAR | Status: DC | PRN
Start: 2023-04-17 — End: 2023-04-18

## 2023-04-17 MED ORDER — FEBUXOSTAT 40 MG PO TABS
40.0000 mg | ORAL_TABLET | Freq: Every morning | ORAL | Status: DC
  Administered 2023-04-18 – 2023-04-25 (×8): 40 mg via ORAL
  Filled 2023-04-17 (×8): qty 1

## 2023-04-17 MED ORDER — LEVALBUTEROL HCL 1.25 MG/0.5ML IN NEBU
1.2500 mg | INHALATION_SOLUTION | Freq: Four times a day (QID) | RESPIRATORY_TRACT | Status: DC | PRN
  Administered 2023-04-17 – 2023-04-19 (×3): 1.25 mg via RESPIRATORY_TRACT
  Filled 2023-04-17 (×5): qty 0.5

## 2023-04-17 MED ORDER — PANTOPRAZOLE SODIUM 40 MG PO TBEC
40.0000 mg | DELAYED_RELEASE_TABLET | Freq: Every day | ORAL | Status: DC
  Administered 2023-04-17 – 2023-04-20 (×4): 40 mg via ORAL
  Filled 2023-04-17 (×4): qty 1

## 2023-04-17 MED ORDER — ONDANSETRON HCL 4 MG PO TABS: 4.0000 mg | ORAL_TABLET | Freq: Four times a day (QID) | ORAL | Status: DC | PRN

## 2023-04-17 MED ORDER — ANASTROZOLE 1 MG PO TABS
1.0000 mg | ORAL_TABLET | Freq: Every day | ORAL | Status: DC
  Administered 2023-04-17 – 2023-04-25 (×9): 1 mg via ORAL
  Filled 2023-04-17 (×10): qty 1

## 2023-04-17 MED ORDER — ANTICOAGULANT SODIUM CITRATE 4% (200MG/5ML) IV SOLN: 5.0000 mL | Status: DC | PRN

## 2023-04-17 MED ORDER — SODIUM CHLORIDE 0.9 % IV SOLN
2.0000 g | INTRAVENOUS | Status: DC
  Administered 2023-04-17 – 2023-04-18 (×2): 2 g via INTRAVENOUS
  Filled 2023-04-17 (×2): qty 20

## 2023-04-17 MED ORDER — OSELTAMIVIR PHOSPHATE 30 MG PO CAPS
30.0000 mg | ORAL_CAPSULE | Freq: Every day | ORAL | Status: DC
  Filled 2023-04-17: qty 1

## 2023-04-17 MED ORDER — LIDOCAINE HCL (PF) 1 % IJ SOLN: 5.0000 mL | INTRAMUSCULAR | Status: DC | PRN

## 2023-04-17 MED ORDER — OSELTAMIVIR PHOSPHATE 30 MG PO CAPS
30.0000 mg | ORAL_CAPSULE | ORAL | Status: AC
  Administered 2023-04-18: 30 mg via ORAL
  Filled 2023-04-17: qty 1

## 2023-04-17 MED ORDER — FLUTICASONE PROPIONATE 50 MCG/ACT NA SUSP
1.0000 | Freq: Every day | NASAL | Status: DC
  Administered 2023-04-18 – 2023-04-25 (×8): 1 via NASAL
  Filled 2023-04-17: qty 16

## 2023-04-17 MED ORDER — NEPRO/CARBSTEADY PO LIQD: 237.0000 mL | ORAL | Status: DC | PRN

## 2023-04-17 MED ORDER — MUPIROCIN 2 % EX OINT
1.0000 | TOPICAL_OINTMENT | Freq: Two times a day (BID) | CUTANEOUS | Status: AC
  Administered 2023-04-17 – 2023-04-21 (×9): 1 via NASAL
  Filled 2023-04-17: qty 22

## 2023-04-17 NOTE — Plan of Care (Signed)
  Problem: Clinical Measurements: Goal: Ability to maintain clinical measurements within normal limits will improve Outcome: Progressing Goal: Respiratory complications will improve Outcome: Not Progressing   Problem: Activity: Goal: Risk for activity intolerance will decrease Outcome: Not Progressing   Problem: Safety: Goal: Ability to remain free from injury will improve Outcome: Progressing

## 2023-04-17 NOTE — Progress Notes (Signed)
   Patient seen and examined at bedside, patient admitted after midnight, please see earlier detailed admission note by Redia LOISE Cleaver, MD. Briefly, patient presented secondary to dyspnea and found to have evidence of multifocal pneumonia in setting of influenza A infection. Hospitalization complicated by evidence of NSTEMI in addition to atrial fibrillation with RVR  Subjective: Breathing is better than on admission. No chest pain.  BP (!) 142/74   Pulse (!) 116   Temp 98.1 F (36.7 C) (Oral)   Resp 14   Ht 5' 2 (1.575 m)   Wt 66.9 kg   SpO2 94%   BMI 26.98 kg/m   General exam: Appears calm and comfortable Respiratory system: Transmitted upper airway wheezing noted. Respiratory effort normal. Cardiovascular system: S1 & S2 heard, irregular with normal rate. Gastrointestinal system: Abdomen is nondistended, soft and nontender.  Normal bowel sounds heard. Central nervous system: Alert and oriented. No focal neurological deficits. Musculoskeletal: No edema. No calf tenderness Psychiatry: Judgement and insight appear normal. Mood & affect appropriate.   Brief assessment/Plan:  Influenza A infection Multifocal pneumonia -Continue Tamiflu  and supportive care -Will resume Ceftriaxone  and azithromycin  secondary to elevated procalcitonin  Acute respiratory failure with hypoxia Secondary to pneumonia. -Wean to room air as able  NSTEMI Cardiology consulted. Heparin  drip started. -Follow-up cardiology recommendations: pending today; heparin  IV  Paroxysmal atrial fibrillation with RVR New onset. Patient is on Toprol  XL as an outpatient -Cardiology recommendations: metoprolol   ESRD on HD Nephrology consulted for HD needs  Primary hypertension -Continue metoprolol  -Will resume home medications pending cardiology recommendations  Family communication: None at bedside DVT prophylaxis: Heparin  IV Disposition: Discharge likely home pending wean off oxygen and cardiology  recommendations  Rachel Lam, MD [facility] 04/17/2023, 12:03 PM

## 2023-04-17 NOTE — Consult Note (Signed)
 Renal Service Consult Note [facility]  Rachel Villarreal 04/17/2023 Rachel JONETTA Fret, MD Requesting Physician: Dr. Briana  Reason for Consult: ESRD pt w/ SOB, URI symptoms HPI: The patient is a 78 y.o. year-old w/ PMH as below who presented to ED due to SOB. In ED CTA chest showed multifocal PNA. Pt started on IV abx. Viral panel + for flu A. Troponins 4k- 5k, also had afib / RVR in ED. Cardiology consulted. Pt admitted. We are asked to see for dialysis.   Pt seen in room. Having SOB and anxiety off and on. 3 L [place] O2 here. No CP.  Just had revision of RUE aVF w/ branch ligation and superficialization.   ROS - denies CP, no joint pain, no HA, no blurry vision, no rash, no diarrhea, no nausea/ vomiting, no dysuria, no difficulty voiding   Past Medical History  Past Medical History:  Diagnosis Date   Abnormal Pap smear of vagina    Anemia    Asthma    Mild   Cancer (HCC)    left breast ILC   Chronic kidney disease (CKD)    stage 4   GERD (gastroesophageal reflux disease)    Gout 05/2012   Hernia, incisional    History of abnormal Pap smear    CIN III, 2001   History of blood transfusion    POST OP   History of uterine fibroid    Hypertension    Peritonitis (HCC)    Respiratory failure (HCC)    after ruptured appendix   Ruptured appendix    Seasonal allergies    Shingles 02/2013   very mild case   Urine incontinence    Past Surgical History  Past Surgical History:  Procedure Laterality Date   A/V FISTULAGRAM Right 03/19/2023   Procedure: A/V Fistulagram;  Surgeon: Sheree Penne Bruckner, MD;  Location: [facility] INVASIVE CV LAB;  Service: Cardiovascular;  Laterality: Right;   APPENDECTOMY     AV FISTULA PLACEMENT Right 04/24/2022   Procedure: RIGHT BRACHIOCEPHALIC ARTERIOVENOUS (AV) FISTULA CREATION;  Surgeon: Eliza Bruckner RAMAN, MD;  Location: [facility] OR;  Service: Vascular;  Laterality: Right;   BREAST BIOPSY Right    fibrocystic changes   BREAST BIOPSY Left  08/02/2021   BREAST LUMPECTOMY WITH RADIOACTIVE SEED LOCALIZATION Left 09/12/2021   Procedure: LEFT BREAST LUMPECTOMY WITH RADIOACTIVE SEED LOCALIZATION;  Surgeon: Ebbie Cough, MD;  Location: [facility];  Service: General;  Laterality: Left;   CATARACT EXTRACTION Bilateral    COLONOSCOPY     COLONOSCOPY WITH PROPOFOL  N/A 02/18/2018   Procedure: COLONOSCOPY WITH PROPOFOL ;  Surgeon: Rosalie Kitchens, MD;  Location: [facility] ENDOSCOPY;  Service: Endoscopy;  Laterality: N/A;  Use ultraslim scope    FISTULA SUPERFICIALIZATION Right 04/13/2023   Procedure: FISTULA SUPERFICIALIZATION;  Surgeon: Sheree Penne Bruckner, MD;  Location: [facility] OR;  Service: Vascular;  Laterality: Right;   IR FLUORO GUIDE CV LINE RIGHT  03/10/2023   LEEP  2000   CIN 2/3   RE-EXCISION OF BREAST LUMPECTOMY Left 09/29/2021   Procedure: LEFT  BREAST RE-EXCISION LUMPECTOMY;  Surgeon: Ebbie Cough, MD;  Location: [facility] ORS;  Service: General;  Laterality: Left;   REFRACTIVE SURGERY     SUPRACERVICAL ABDOMINAL HYSTERECTOMY  2001   Family History  Family History  Problem Relation Age of Onset   Colon cancer Father    Breast cancer Neg Hx    Social History  reports that she quit smoking about 51 years ago. Her smoking use included  cigarettes. She has never used smokeless tobacco. She reports that she does not drink alcohol  and does not use drugs. Allergies  Allergies  Allergen Reactions   Ace Inhibitors     angioedema   Lactose Intolerance (Gi) Other (See Comments)    GI upset   Letrozole  Hives   Lisinopril Swelling   Mercury     Red eyes, through eye drops that contained thimerosal    Tape Dermatitis    plastic   Home medications Prior to Admission medications   Medication Sig Start Date End Date Taking? Authorizing Provider  acetaminophen  (TYLENOL ) 500 MG tablet Take 500 mg by mouth every 6 (six) hours as needed for mild pain (pain score 1-3).   Yes [provider]  amLODipine  (NORVASC ) 5 MG  tablet Take 5 mg by mouth in the morning. 02/15/23  Yes [provider]  anastrozole  (ARIMIDEX ) 1 MG tablet Take 1 tablet (1 mg total) by mouth daily. 09/07/22  Yes Gudena, Vinay, MD  atorvastatin  (LIPITOR ) 10 MG tablet Take 10 mg by mouth in the morning.   Yes [provider]  budesonide  (RHINOCORT  AQUA) 32 MCG/ACT nasal spray Place 2 sprays into both nostrils in the morning. 12/12/22  Yes [provider]  Cholecalciferol 50 MCG (2000 UT) CAPS Take 2,000 Units by mouth in the morning. 12/27/22  Yes [provider]  diclofenac  Sodium (VOLTAREN ) 1 % GEL Apply 1 Application topically 4 (four) times daily as needed (pain.).   Yes [provider]  eszopiclone (LUNESTA) 2 MG TABS tablet Take 2 mg by mouth at bedtime. 03/27/23  Yes [provider]  febuxostat  (ULORIC ) 40 MG tablet Take 40 mg by mouth in the morning.   Yes [provider]  furosemide  (LASIX ) 80 MG tablet Take 80 mg by mouth See admin instructions. Non dialysis days. (Sunday, Monday, Wednesday, and Friday) 03/26/23  Yes [provider]  loratadine  (CLARITIN ) 10 MG tablet Take 10 mg by mouth 2 (two) times daily. 12/12/22  Yes [provider]  losartan  (COZAAR ) 100 MG tablet Take 100 mg by mouth in the morning. 07/27/21  Yes [provider]  metoprolol  succinate (TOPROL -XL) 50 MG 24 hr tablet Take 50 mg by mouth at bedtime. At bedtime   Yes [provider]  Multiple Vitamins-Minerals (PRESERVISION AREDS 2+MULTI VIT PO) Take 1 capsule by mouth 2 (two) times daily.   Yes [provider]  pantoprazole  (PROTONIX ) 40 MG tablet Take 40 mg by mouth daily.   Yes [provider]  PROAIR  HFA 108 (90 BASE) MCG/ACT inhaler Inhale 1-2 puffs into the lungs every 6 (six) hours as needed for shortness of breath or wheezing (for respiratory issues.). 12/03/14  Yes [provider]  sevelamer  carbonate (RENVELA ) 800 MG tablet Take 800 mg by mouth 3  (three) times daily with meals. 03/15/23  Yes [provider]     Vitals:   04/17/23 0600 04/17/23 0802 04/17/23 0940 04/17/23 1238  BP: (!) 113/52 126/65 (!) 142/74 (!) 146/95  Pulse: 70  (!) 116 88  Resp: 14     Temp:  98.1 F (36.7 C)  98.3 F (36.8 C)  TempSrc:  Oral  Oral  SpO2: 99% 94%    Weight:      Height:       Exam Gen alert, no distress No rash, cyanosis or gangrene Sclera anicteric, throat clear  No jvd or bruits Chest occ crackles bilat, mild wheezing RRR no MRG Abd soft ntnd no mass  or ascites +bs GU defer MS no joint effusions or deformity Ext no LE or UE edema, no wounds or ulcers Neuro is alert, Ox 3 , nf    R AVF +bruit, recent wound healing/ RIJ TDC       OP HD: NW TTS 4h   400/1.5   2/2.5 bath  64 kg  Heparin  4000   Right TDC (revised RUE AVF)  - mircera 50 mcg q 2, last 1/02, due 1/16   Assessment/ Plan: Flu A/ multifocal pna - by CXR and CT chest. Per pmd Acute hypoxic resp failure - due to pna. Getting 2-3 L [place]. No vol overload on exam. Max UF w/ HD as tolerated.  ESRD - on HD TTS. Plan HD tonight or possibly tomorrow am.  HD access - per [facility] AVF should be ready 4 wks fro m1/3/25.  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 Follow.  Secondary hyperparathyroidism - CCa in range. Cont renvela  as binder ac.       Rachel Fret  MD [facility] 04/17/2023, 2:06 PM  Recent Labs  Lab 04/16/23 1230 04/17/23 0018 04/17/23 0628  HGB 9.5*  --  9.5*  ALBUMIN   --  2.6*  --   CALCIUM  9.1 8.6*  --   CREATININE 6.24* 6.77*  --   K 3.8 3.9  --    Inpatient medications:  albuterol   5 mg Nebulization Once   anastrozole   1 mg Oral Daily   aspirin  EC  81 mg Oral Daily   [START ON 04/18/2023] atorvastatin   10 mg Oral q AM   azithromycin   500 mg Oral Daily   Chlorhexidine  Gluconate Cloth  6 each Topical Q0600   docusate sodium   100 mg Oral BID   [START ON 04/18/2023] febuxostat   40 mg  Oral q AM   [START ON 04/18/2023] fluticasone   1 spray Each Nare Daily   metoprolol  tartrate  37.5 mg Oral Q6H   mupirocin  ointment  1 Application Nasal BID   oseltamivir   30 mg Oral QHS   pantoprazole   40 mg Oral Daily   sevelamer  carbonate  800 mg Oral TID WC    cefTRIAXone  (ROCEPHIN )  IV 2 g (04/17/23 1236)   heparin  800 Units/hr (04/16/23 1449)   acetaminophen  **OR** acetaminophen , albuterol , diclofenac  Sodium, ondansetron  **OR** ondansetron  (ZOFRAN ) IV, perflutren  lipid microspheres (DEFINITY ) IV suspension

## 2023-04-17 NOTE — Assessment & Plan Note (Signed)
 Med rec pending ON metoprolol ordered by cards Im gonna hold off on ordering any of her other home BP meds (amlodipine, losartan) and defer to cards for the moment.

## 2023-04-17 NOTE — Assessment & Plan Note (Signed)
 Call nephro in AM for routine dialysis during stay. Med rec pending Plan to continue other dialysis related meds: ie phosphate binders, lasix.

## 2023-04-17 NOTE — Progress Notes (Signed)
 Patient arrived on unit. Requested to go to bathroom before being hooked up to monitor. Patient ambulated back to bed and placed on monitor. HR in the 170s. Patient denies chest pain/pressure/discomfort, palpitations, increasing shortness of breath. EKG done.   Dr. Franky notified that patient arrived and current vital signs. Dr. Franky arrived to bedside and shortly after Dr. Floretta arrived.   Dr. Floretta gave verbal order for metoprolol  5 mg IV push x1 dose. This order was read back and confirmed. Additionally a verbal order for metoprolol  tartrate 25 mg PO Q6H, which was read back and verified. I requested CMP and magnesium  level, this order was given and placed.

## 2023-04-17 NOTE — Plan of Care (Signed)

## 2023-04-17 NOTE — Progress Notes (Signed)
  Echocardiogram 2D Echocardiogram has been performed.  Rachel Villarreal P Oleva Koo 04/17/2023, 1:12 PM

## 2023-04-17 NOTE — Assessment & Plan Note (Signed)
 Tamiflu Not clear if Influenza A PNA, vs superimposed bacterial PNA WBC nl Got single dose rocephin + azithro in ED Check procalcitonin to help decide if  needs further ABx

## 2023-04-17 NOTE — Progress Notes (Signed)
 Patient called out complaining of difficulty breathing. Patient was at 83% on 2 liters upon arrival to room with obvious sign of distress. Patient was relieved following repositioning and prn nebulizer.

## 2023-04-17 NOTE — H&P (Signed)
 History and Physical    Patient: Rachel Villarreal FMW:[id number] DOB: 04-01-1946 DOA: 04/16/2023 DOS: the patient was seen and examined on 04/17/2023 PCP: Ransom Other, MD  Patient coming from: Home  Chief Complaint:  Chief Complaint  Patient presents with   Shortness of Breath   HPI: Rachel Villarreal is a 78 y.o. female with medical history significant of HTN, ESRD on HD, breast CA.  Pt in to ED at med center due to SOB.  No CP.  Pt with recent airline travel, also exposures at HD.  CTA chest showed multifocal PNA.  Pt given doses of rocephin  and azithromycin .  Viral panel came back positive for influenza A.  Trops elevated 5400 -> 4400.  Pt went into A.Fib RVR (new onset) on arrival to [facility].  Cards managing with metoprolol .    Review of Systems: As mentioned in the history of present illness. All other systems reviewed and are negative. Past Medical History:  Diagnosis Date   Abnormal Pap smear of vagina    Anemia    Asthma    Mild   Cancer (HCC)    left breast ILC   Chronic kidney disease (CKD)    stage 4   GERD (gastroesophageal reflux disease)    Gout 05/2012   Hernia, incisional    History of abnormal Pap smear    CIN III, 2001   History of blood transfusion    POST OP   History of uterine fibroid    Hypertension    Peritonitis (HCC)    Respiratory failure (HCC)    after ruptured appendix   Ruptured appendix    Seasonal allergies    Shingles 02/2013   very mild case   Urine incontinence    Past Surgical History:  Procedure Laterality Date   A/V FISTULAGRAM Right 03/19/2023   Procedure: A/V Fistulagram;  Surgeon: Sheree Penne Bruckner, MD;  Location: [facility] INVASIVE CV LAB;  Service: Cardiovascular;  Laterality: Right;   APPENDECTOMY     AV FISTULA PLACEMENT Right 04/24/2022   Procedure: RIGHT BRACHIOCEPHALIC ARTERIOVENOUS (AV) FISTULA CREATION;  Surgeon: Eliza Bruckner RAMAN, MD;  Location: [facility] OR;  Service: Vascular;  Laterality: Right;   BREAST BIOPSY Right     fibrocystic changes   BREAST BIOPSY Left 08/02/2021   BREAST LUMPECTOMY WITH RADIOACTIVE SEED LOCALIZATION Left 09/12/2021   Procedure: LEFT BREAST LUMPECTOMY WITH RADIOACTIVE SEED LOCALIZATION;  Surgeon: Ebbie Cough, MD;  Location: [facility];  Service: General;  Laterality: Left;   CATARACT EXTRACTION Bilateral    COLONOSCOPY     COLONOSCOPY WITH PROPOFOL  N/A 02/18/2018   Procedure: COLONOSCOPY WITH PROPOFOL ;  Surgeon: Rosalie Kitchens, MD;  Location: [facility] ENDOSCOPY;  Service: Endoscopy;  Laterality: N/A;  Use ultraslim scope    FISTULA SUPERFICIALIZATION Right 04/13/2023   Procedure: FISTULA SUPERFICIALIZATION;  Surgeon: Sheree Penne Bruckner, MD;  Location: [facility] OR;  Service: Vascular;  Laterality: Right;   IR FLUORO GUIDE CV LINE RIGHT  03/10/2023   LEEP  2000   CIN 2/3   RE-EXCISION OF BREAST LUMPECTOMY Left 09/29/2021   Procedure: LEFT  BREAST RE-EXCISION LUMPECTOMY;  Surgeon: Ebbie Cough, MD;  Location: [facility] ORS;  Service: General;  Laterality: Left;   REFRACTIVE SURGERY     SUPRACERVICAL ABDOMINAL HYSTERECTOMY  2001   Social History:  reports that she quit smoking about 51 years ago. Her smoking use included cigarettes. She has never used smokeless tobacco. She reports that she does not drink alcohol  and does not use drugs.  Allergies  Allergen Reactions   Ace Inhibitors     angioedema   Lactose Intolerance (Gi) Other (See Comments)    GI upset   Letrozole  Hives   Lisinopril Swelling   Mercury     Red eyes, through eye drops that contained thimerosal    Tape Dermatitis    plastic    Family History  Problem Relation Age of Onset   Colon cancer Father    Breast cancer Neg Hx     Prior to Admission medications   Medication Sig Start Date End Date Taking? Authorizing Provider  eszopiclone (LUNESTA) 1 MG TABS tablet Take 1 mg by mouth at bedtime as needed for sleep. Take immediately before bedtime   Yes [provider]  acetaminophen   (TYLENOL ) 500 MG tablet Take 500 mg by mouth every 6 (six) hours as needed for mild pain (pain score 1-3).    [provider]  amLODipine  (NORVASC ) 5 MG tablet Take 5 mg by mouth in the morning. 02/15/23   [provider]  anastrozole  (ARIMIDEX ) 1 MG tablet Take 1 tablet (1 mg total) by mouth daily. 09/07/22   Gudena, Vinay, MD  atorvastatin  (LIPITOR ) 10 MG tablet Take 10 mg by mouth in the morning.    [provider]  budesonide  (RHINOCORT  AQUA) 32 MCG/ACT nasal spray Place 2 sprays into both nostrils in the morning. 12/12/22   [provider]  Cholecalciferol 50 MCG (2000 UT) CAPS Take 2,000 Units by mouth in the morning. 12/27/22   [provider]  diclofenac  Sodium (VOLTAREN ) 1 % GEL Apply 1 Application topically 4 (four) times daily as needed (pain.).    [provider]  febuxostat  (ULORIC ) 40 MG tablet Take 40 mg by mouth in the morning.    [provider]  furosemide  (LASIX ) 40 MG tablet Take 80 mg by mouth 4 (four) times a week. Take 2 tablets (80 mg) by mouth once daily on non-dialysis days Mondays, Wednesdays, Fridays & Sundays. 02/24/23   [provider]  loratadine  (CLARITIN ) 10 MG tablet Take 10 mg by mouth in the morning and at bedtime. 12/12/22   [provider]  losartan  (COZAAR ) 100 MG tablet Take 100 mg by mouth in the morning. 07/27/21   [provider]  melatonin 5 MG TABS Take 1 tablet (5 mg total) by mouth at bedtime as needed. Patient taking differently: Take 10 mg by mouth at bedtime. 08/29/21   Gudena, Vinay, MD  metoprolol  succinate (TOPROL -XL) 50 MG 24 hr tablet Take 50 mg by mouth at bedtime. At bedtime    [provider]  Multiple Vitamins-Minerals (PRESERVISION AREDS 2+MULTI VIT PO) Take 1 capsule by mouth in the morning and at bedtime.    [provider]  oxyCODONE -acetaminophen  (PERCOCET/ROXICET) 5-325 MG tablet Take 1 tablet by mouth every 6 (six) hours as needed for  severe pain (pain score 7-10). 04/13/23   Gerome Maurilio HERO, PA-C  pantoprazole  (PROTONIX ) 40 MG tablet Take 40 mg by mouth daily.    [provider]  PROAIR  HFA 108 (90 BASE) MCG/ACT inhaler Inhale 1-2 puffs into the lungs every 6 (six) hours as needed for shortness of breath or wheezing (for respiratory issues.). 12/03/14   [provider]  sevelamer  (RENAGEL ) 800 MG tablet Take 800 mg by mouth 3 (three) times daily with meals. 03/05/23   [provider]    Physical Exam: Vitals:   04/16/23 2300 04/16/23 2325 04/16/23 2350 04/17/23 0000  BP: (!) 133/104 131/60  ROLLEN)  144/62  Pulse: 99 (!) 175 92 89  Resp: (!) 23 20 (!) 28 (!) 22  Temp:      TempSrc:      SpO2: (!) 88% 93% 91% 91%  Weight:      Height:       Constitutional: NAD, calm, comfortable Respiratory: clear to auscultation bilaterally, no wheezing, no crackles. Normal respiratory effort. No accessory muscle use.  Cardiovascular: Converted out of a.fib at this time Abdomen: no tenderness, no masses palpated. No hepatosplenomegaly. Bowel sounds positive.  Neurologic: CN 2-12 grossly intact. Sensation intact, DTR normal. Strength 5/5 in all 4.  Psychiatric: Normal judgment and insight. Alert and oriented x 3. Normal mood.   Data Reviewed:    Labs on Admission: I have personally reviewed following labs and imaging studies  CBC: Recent Labs  Lab 04/13/23 0821 04/16/23 1230  WBC  --  7.8  NEUTROABS  --  6.1  HGB 9.9* 9.5*  HCT 29.0* 28.4*  MCV  --  95.3  PLT  --  182   Basic Metabolic Panel: Recent Labs  Lab 04/13/23 0821 04/16/23 1230  NA 140 138  K 3.8 3.8  CL 102 97*  CO2  --  28  GLUCOSE 97 124*  BUN 23 48*  CREATININE 4.10* 6.24*  CALCIUM   --  9.1   GFR: Estimated Creatinine Clearance: 6.6 mL/min (A) (by C-G formula based on SCr of 6.24 mg/dL (H)). Liver Function Tests: No results for input(s): AST, ALT, ALKPHOS, BILITOT, PROT, ALBUMIN  in the last 168 hours. No  results for input(s): LIPASE, AMYLASE in the last 168 hours. No results for input(s): AMMONIA in the last 168 hours. Coagulation Profile: No results for input(s): INR, PROTIME in the last 168 hours. Cardiac Enzymes: No results for input(s): CKTOTAL, CKMB, CKMBINDEX, TROPONINI in the last 168 hours. BNP (last 3 results) No results for input(s): PROBNP in the last 8760 hours. HbA1C: No results for input(s): HGBA1C in the last 72 hours. CBG: No results for input(s): GLUCAP in the last 168 hours. Lipid Profile: No results for input(s): CHOL, HDL, LDLCALC, TRIG, CHOLHDL, LDLDIRECT in the last 72 hours. Thyroid  Function Tests: No results for input(s): TSH, T4TOTAL, FREET4, T3FREE, THYROIDAB in the last 72 hours. Anemia Panel: No results for input(s): VITAMINB12, FOLATE, FERRITIN, TIBC, IRON, RETICCTPCT in the last 72 hours. Urine analysis:    Component Value Date/Time   COLORURINE YELLOW 10/16/2022 1340   APPEARANCEUR CLOUDY (A) 10/16/2022 1340   LABSPEC 1.016 10/16/2022 1340   PHURINE 5.0 10/16/2022 1340   GLUCOSEU NEGATIVE 10/16/2022 1340   HGBUR SMALL (A) 10/16/2022 1340   BILIRUBINUR NEGATIVE 10/16/2022 1340   KETONESUR NEGATIVE 10/16/2022 1340   PROTEINUR >=300 (A) 10/16/2022 1340   NITRITE NEGATIVE 10/16/2022 1340   LEUKOCYTESUR MODERATE (A) 10/16/2022 1340    Radiological Exams on Admission: CT Angio Chest PE W and/or Wo Contrast Result Date: 04/16/2023 CLINICAL DATA:  Pulmonary embolus suspected with high probability. History of asthma. Cold-like symptoms for a week. Shortness of breath for 3 days. EXAM: CT ANGIOGRAPHY CHEST WITH CONTRAST TECHNIQUE: Multidetector CT imaging of the chest was performed using the standard protocol during bolus administration of intravenous contrast. Multiplanar CT image reconstructions and MIPs were obtained to evaluate the vascular anatomy. RADIATION DOSE REDUCTION: This exam was performed  according to the departmental dose-optimization program which includes automated exposure control, adjustment of the mA and/or kV according to patient size and/or use of iterative reconstruction technique. CONTRAST:  75mL OMNIPAQUE  IOHEXOL  350  MG/ML SOLN COMPARISON:  CT chest abdomen and pelvis 11/23/2022 FINDINGS: Cardiovascular: Technically adequate study with moderately good opacification of the central and segmental pulmonary arteries. Mild motion artifact. No focal filling defects are identified. No evidence of significant pulmonary embolus. Right central venous catheter with tip in the low SVC. Mild cardiac enlargement. No pericardial effusions. Normal caliber thoracic aorta. No aortic dissection. Calcification of the aorta and coronary arteries. Mediastinum/Nodes: Thyroid  gland is unremarkable. Esophagus is decompressed. Mediastinal lymph nodes are not pathologically enlarged, likely reactive. Lungs/Pleura: Small bilateral pleural effusions with basilar atelectasis. Patchy airspace disease throughout both lungs with bronchial wall thickening likely representing multifocal pneumonia or possibly aspiration. Underlying airways disease may be present. Upper Abdomen: No acute abnormalities demonstrated. Musculoskeletal: Degenerative changes in the spine. No acute bony abnormalities. Review of the MIP images confirms the above findings. IMPRESSION: 1. No evidence of significant pulmonary embolus. 2. Mild cardiac enlargement. 3. Aortic atherosclerosis. 4. Small bilateral pleural effusions. 5. Patchy airspace disease throughout both lungs with bronchial wall thickening likely representing multifocal pneumonia, possibly aspiration, possibly with underlying airways disease. Electronically Signed   By: Elsie Gravely M.D.   On: 04/16/2023 18:11   DG Chest 2 View Result Date: 04/16/2023 CLINICAL DATA:  Shortness of breath for 3 days, worse with ambulation. Cold-like symptoms last week. Hemodialysis patient. EXAM:  CHEST - 2 VIEW COMPARISON:  Radiographs 08/23/2022 and 12/21/2020.  CT 11/23/2022. FINDINGS: Right IJ hemodialysis catheter projects over the upper right atrium. The heart size and mediastinal contours are stable. Increased mildly heterogeneous perihilar opacities in both lungs compared with prior radiographs. No confluent airspace disease or significant pleural effusion identified. There is chronic scarring in the lingula. No pneumothorax. The bones appear unchanged. IMPRESSION: Increased mildly heterogeneous perihilar opacities in both lungs, possibly mild edema or atypical infection. No confluent airspace disease or significant pleural effusion. Short-term radiographic follow-up recommended if the patient remains symptomatic. Electronically Signed   By: Elsie Perone M.D.   On: 04/16/2023 13:40    EKG: Independently reviewed.   Assessment and Plan: * Non-ST elevated myocardial infarction (HCC) No CP ? If just demand ischemia in setting of new onset a.fib RVR which in turn is in setting of Influenza A + PNA. Heparin  gtt Tele monitor Rate control of A.Fib per cards (they have ordered scheduled metoprolol ) 2d echo tomorrow Making NPO except sips with meds for the moment until cards clears her to advance diet / decides on plan if/when LHC? Cont lipitor  Check FLP in AM  Paroxysmal atrial fibrillation with RVR (HCC) New onset a.fib RVR in setting of acute illness (Flu). Heparin  gtt Rate control per cards (scheduled metoprolol ).  Influenza A with pneumonia Tamiflu  Not clear if Influenza A PNA, vs superimposed bacterial PNA WBC nl Got single dose rocephin  + azithro in ED Check procalcitonin to help decide if  needs further ABx  ESRD (end stage renal disease) (HCC) Call nephro in AM for routine dialysis during stay. Med rec pending Plan to continue other dialysis related meds: ie phosphate binders, lasix .  Essential hypertension Med rec pending ON metoprolol  ordered by cards Im  gonna hold off on ordering any of her other home BP meds (amlodipine , losartan ) and defer to cards for the moment.      Advance Care Planning:   Code Status: Full Code  Consults: Dr. Floretta  Family Communication: No family in room  Severity of Illness: The appropriate patient status for this patient is OBSERVATION. Observation status is judged to be reasonable and  necessary in order to provide the required intensity of service to ensure the patient's safety. The patient's presenting symptoms, physical exam findings, and initial radiographic and laboratory data in the context of their medical condition is felt to place them at decreased risk for further clinical deterioration. Furthermore, it is anticipated that the patient will be medically stable for discharge from the hospital within 2 midnights of admission.   Author: Shereece Wellborn M., DO 04/17/2023 1:09 AM  For on call review [email or web address].

## 2023-04-17 NOTE — Assessment & Plan Note (Signed)
 New onset a.fib RVR in setting of acute illness (Flu). Heparin gtt Rate control per cards (scheduled metoprolol).

## 2023-04-17 NOTE — Progress Notes (Signed)
 PHARMACY - ANTICOAGULATION CONSULT NOTE  Pharmacy Consult for heparin  Indication: chest pain/ACS  Allergies  Allergen Reactions   Ace Inhibitors     angioedema   Lactose Intolerance (Gi) Other (See Comments)    GI upset   Letrozole  Hives   Lisinopril Swelling   Mercury     Red eyes, through eye drops that contained thimerosal    Tape Dermatitis    plastic    Patient Measurements: Height: 5' 2 (157.5 cm) Weight: 66.9 kg (147 lb 7.8 oz) IBW/kg (Calculated) : 50.1 Heparin  Dosing Weight: 63kg  Vital Signs: Temp: 98.1 F (36.7 C) (01/07 0802) Temp Source: Oral (01/07 0802) BP: 126/65 (01/07 0802) Pulse Rate: 70 (01/07 0600)  Labs: Recent Labs    04/16/23 1230 04/16/23 1427 04/16/23 2243 04/17/23 0018 04/17/23 0628  HGB 9.5*  --   --   --  9.5*  HCT 28.4*  --   --   --  28.4*  PLT 182  --   --   --  208  HEPARINUNFRC  --   --  0.34  --  0.54  CREATININE 6.24*  --   --  6.77*  --   TROPONINIHS 5,456* 4,458*  --   --   --     Estimated Creatinine Clearance: 6 mL/min (A) (by C-G formula based on SCr of 6.77 mg/dL (H)).   Assessment: 59 yof presented to the ED with SOB. Troponin elevated and with afib/RVR now on IV heparin .  -heparin  level at goal on 800 units/hr -CBC stable  Goal of Therapy:  Heparin  level 0.3-0.7 units/ml Monitor platelets by anticoagulation protocol: Yes   Plan:  Continue heparin  gtt 800 units/hr Daily heparin  level and CBC  Prentice Poisson, PharmD Clinical Pharmacist **Pharmacist phone directory can now be found on [email or web address] ([email or web address]).  Listed under [facility].

## 2023-04-17 NOTE — Assessment & Plan Note (Addendum)
 No CP ? If just demand ischemia in setting of new onset a.fib RVR which in turn is in setting of Influenza A + PNA. Heparin  gtt Tele monitor Rate control of A.Fib per cards (they have ordered scheduled metoprolol ) 2d echo tomorrow Making NPO except sips with meds for the moment until cards clears her to advance diet / decides on plan if/when LHC? Cont lipitor  Check FLP in AM

## 2023-04-18 DIAGNOSIS — I214 Non-ST elevation (NSTEMI) myocardial infarction: Secondary | ICD-10-CM | POA: Diagnosis not present

## 2023-04-18 DIAGNOSIS — I10 Essential (primary) hypertension: Secondary | ICD-10-CM | POA: Diagnosis not present

## 2023-04-18 DIAGNOSIS — J101 Influenza due to other identified influenza virus with other respiratory manifestations: Secondary | ICD-10-CM

## 2023-04-18 DIAGNOSIS — I480 Paroxysmal atrial fibrillation: Secondary | ICD-10-CM | POA: Diagnosis not present

## 2023-04-18 DIAGNOSIS — Z992 Dependence on renal dialysis: Secondary | ICD-10-CM

## 2023-04-18 DIAGNOSIS — N186 End stage renal disease: Secondary | ICD-10-CM | POA: Diagnosis not present

## 2023-04-18 LAB — CBC
HCT: 30.5 % — ABNORMAL LOW (ref 36.0–46.0)
Hemoglobin: 10.3 g/dL — ABNORMAL LOW (ref 12.0–15.0)
MCH: 31.4 pg (ref 26.0–34.0)
MCHC: 33.8 g/dL (ref 30.0–36.0)
MCV: 93 fL (ref 80.0–100.0)
Platelets: 251 10*3/uL (ref 150–400)
RBC: 3.28 MIL/uL — ABNORMAL LOW (ref 3.87–5.11)
RDW: 13.5 % (ref 11.5–15.5)
WBC: 8.8 10*3/uL (ref 4.0–10.5)
nRBC: 0 % (ref 0.0–0.2)

## 2023-04-18 LAB — COMPREHENSIVE METABOLIC PANEL
ALT: 7 U/L (ref 0–44)
AST: 33 U/L (ref 15–41)
Albumin: 2.9 g/dL — ABNORMAL LOW (ref 3.5–5.0)
Alkaline Phosphatase: 81 U/L (ref 38–126)
Anion gap: 14 (ref 5–15)
BUN: 23 mg/dL (ref 8–23)
CO2: 26 mmol/L (ref 22–32)
Calcium: 8.8 mg/dL — ABNORMAL LOW (ref 8.9–10.3)
Chloride: 96 mmol/L — ABNORMAL LOW (ref 98–111)
Creatinine, Ser: 3.9 mg/dL — ABNORMAL HIGH (ref 0.44–1.00)
GFR, Estimated: 11 mL/min — ABNORMAL LOW (ref >60)
Glucose, Bld: 105 mg/dL — ABNORMAL HIGH (ref 70–99)
Potassium: 3.5 mmol/L (ref 3.5–5.1)
Sodium: 136 mmol/L (ref 135–145)
Total Bilirubin: 1 mg/dL (ref 0.0–1.2)
Total Protein: 6.2 g/dL — ABNORMAL LOW (ref 6.5–8.1)

## 2023-04-18 LAB — HEMOGLOBIN A1C
Hgb A1c MFr Bld: 5.2 % (ref 4.8–5.6)
Mean Plasma Glucose: 103 mg/dL

## 2023-04-18 LAB — HEPATITIS B SURFACE ANTIBODY, QUANTITATIVE: Hep B S AB Quant (Post): 188 m[IU]/mL

## 2023-04-18 LAB — LIPOPROTEIN A (LPA): Lipoprotein (a): 217.1 nmol/L — ABNORMAL HIGH (ref <75.0)

## 2023-04-18 LAB — HEPARIN LEVEL (UNFRACTIONATED): Heparin Unfractionated: 0.2 [IU]/mL — ABNORMAL LOW (ref 0.30–0.70)

## 2023-04-18 MED ORDER — ENOXAPARIN SODIUM 60 MG/0.6ML IJ SOSY
55.0000 mg | PREFILLED_SYRINGE | INTRAMUSCULAR | Status: DC
  Administered 2023-04-18: 55 mg via SUBCUTANEOUS
  Filled 2023-04-18 (×2): qty 0.6

## 2023-04-18 MED ORDER — CHLORHEXIDINE GLUCONATE CLOTH 2 % EX PADS
6.0000 | MEDICATED_PAD | Freq: Every day | CUTANEOUS | Status: DC
  Administered 2023-04-20 – 2023-04-25 (×3): 6 via TOPICAL

## 2023-04-18 MED ORDER — ATORVASTATIN CALCIUM 80 MG PO TABS
80.0000 mg | ORAL_TABLET | Freq: Every morning | ORAL | Status: DC
  Administered 2023-04-19 – 2023-04-25 (×7): 80 mg via ORAL
  Filled 2023-04-18 (×7): qty 1

## 2023-04-18 MED ORDER — HYDRALAZINE HCL 25 MG PO TABS
25.0000 mg | ORAL_TABLET | Freq: Three times a day (TID) | ORAL | Status: DC
  Administered 2023-04-18 (×3): 25 mg via ORAL
  Filled 2023-04-18 (×3): qty 1

## 2023-04-18 MED ORDER — OSELTAMIVIR PHOSPHATE 30 MG PO CAPS
30.0000 mg | ORAL_CAPSULE | ORAL | Status: AC
  Administered 2023-04-20: 30 mg via ORAL
  Filled 2023-04-18: qty 1

## 2023-04-18 NOTE — Progress Notes (Signed)
 PHARMACY - ANTICOAGULATION CONSULT NOTE  Pharmacy Consult for heparin  Indication: chest pain/ACS  Allergies  Allergen Reactions   Ace Inhibitors     angioedema   Lactose Intolerance (Gi) Other (See Comments)    GI upset   Letrozole  Hives   Lisinopril Swelling   Mercury     Red eyes, through eye drops that contained thimerosal    Tape Dermatitis    plastic    Patient Measurements: Height: 5' 2 (157.5 cm) Weight: 64.8 kg (142 lb 13.7 oz) IBW/kg (Calculated) : 50.1 Heparin  Dosing Weight: 63kg  Vital Signs: Temp: 98 F (36.7 C) (01/08 1100) Temp Source: Oral (01/08 1100) BP: 133/75 (01/08 1100) Pulse Rate: 85 (01/08 1100)  Labs: Recent Labs    04/16/23 1230 04/16/23 1427 04/16/23 2243 04/17/23 0018 04/17/23 0628 04/18/23 0243 04/18/23 1020  HGB 9.5*  --   --   --  9.5* 10.3*  --   HCT 28.4*  --   --   --  28.4* 30.5*  --   PLT 182  --   --   --  208 251  --   HEPARINUNFRC  --   --  0.34  --  0.54  --  0.20*  CREATININE 6.24*  --   --  6.77*  --  3.90*  --   TROPONINIHS 5,456* 4,458*  --   --   --   --   --     Estimated Creatinine Clearance: 10.3 mL/min (A) (by C-G formula based on SCr of 3.9 mg/dL (H)).   Assessment: 25 yof presented to the ED with SOB. Troponin elevated and with afib/RVR. Pharmacy consulted for heparin .   Heparin  level 0.2 is subtherapeutic on 800 units/hr. Spoke with Cardiology who agrees with switching to enoxaparin  with plan to hold morning dose on day of cath. Cath plan pending. Noted patient is on dialysis, will dose adjust enoxaparin .  Goal of Therapy:  Heparin  level 0.3-0.7 units/ml Monitor platelets by anticoagulation protocol: Yes   Plan:  Stop heparin  gtt 800 units/hr Start enoxaparin  0.85mg /kg = 55 mg q24hr  F/u cath plans, hold enoxaparin  on the day of cath  Jinnie Door, PharmD, BCPS, BCCP Clinical Pharmacist  Please check AMION for all [facility] Pharm[facility] phone numbers After 10:00 PM, call Main Pharmacy [phone]

## 2023-04-18 NOTE — Progress Notes (Signed)
 [facility] Progress Note  Subjective: seen in room. 2 L off last night w/ HD. Looks better, not as SOB today.   Vitals:   04/18/23 1100 04/18/23 1200 04/18/23 1300 04/18/23 1400  BP: 133/75 136/67 134/66 (!) 147/67  Pulse: 85 88 78 79  Resp: (!) 24 (!) 22 (!) 21 (!) 21  Temp: 98 F (36.7 C)     TempSrc: Oral     SpO2: 97% 97% 97% 99%  Weight:      Height:        Exam: Gen alert, no distress No rash, cyanosis or gangrene Sclera anicteric, throat clear  No jvd or bruits Chest occ crackles bilat, mild wheezing RRR no MRG Abd soft ntnd no mass or ascites +bs Ext no LE or UE edema, no wounds or ulcers Neuro is alert, Ox 3 , nf    R AVF +bruit, recent wound healing/ RIJ TDC            OP HD: [facility] TTS 4h   400/1.5   2/2.5 bath  64 kg  Heparin  4000   Right TDC (revised RUE AVF)  - mircera 50 mcg q 2, last 1/02, due 1/16     Assessment/ Plan: Flu A/ multifocal pna - by CXR and CT chest. Per pmd Acute hypoxic resp failure - due to pna. Getting 2-3 L [place]. Not on home O2. No vol overload on exam. Got UF 2 L w/ HD last night.  ESRD - on HD TTS. Had HD here yesterday. HD tomorrow.  HD access - per [facility] AVF should be ready 4 wks from 04/13/23.  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 Follow.  Secondary hyperparathyroidism - CCa in range. Cont renvela  as binder ac.        Rachel Fret MD  [facility] 04/18/2023, 4:11 PM  Recent Labs  Lab 04/17/23 0018 04/17/23 0628 04/18/23 0243  HGB  --  9.5* 10.3*  ALBUMIN  2.6*  --  2.9*  CALCIUM  8.6*  --  8.8*  CREATININE 6.77*  --  3.90*  K 3.9  --  3.5   No results for input(s): IRON, TIBC, FERRITIN in the last 168 hours. Inpatient medications:  albuterol   5 mg Nebulization Once   anastrozole   1 mg Oral Daily   aspirin  EC  81 mg Oral Daily   [START ON 04/19/2023] atorvastatin   80 mg Oral q AM   azithromycin   500 mg Oral Daily   Chlorhexidine  Gluconate  Cloth  6 each Topical Q0600   Chlorhexidine  Gluconate Cloth  6 each Topical Q0600   docusate sodium   100 mg Oral BID   enoxaparin  (LOVENOX ) injection  55 mg Subcutaneous Q24H   febuxostat   40 mg Oral q AM   fluticasone   1 spray Each Nare Daily   hydrALAZINE   25 mg Oral Q8H   metoprolol  tartrate  37.5 mg Oral Q6H   mupirocin  ointment  1 Application Nasal BID   [START ON 04/19/2023] oseltamivir   30 mg Oral Q T,Th,Sat-1800   pantoprazole   40 mg Oral Daily   sevelamer  carbonate  800 mg Oral TID WC    cefTRIAXone  (ROCEPHIN )  IV 2 g (04/18/23 1302)   acetaminophen  **OR** acetaminophen , diclofenac  Sodium, levalbuterol , ondansetron  **OR** ondansetron  (ZOFRAN ) IV

## 2023-04-18 NOTE — Progress Notes (Signed)
 Pt turns to Afib RVR HR @150s . Pt alert and oriented. Lopressor  schedule for midnight was given early.  Rechecked VS. BP (!) 154/62   Pulse 80   Temp 98.5 F (36.9 C)   Resp 17   Ht 5' 2 (1.575 m)   Wt 64.8 kg   SpO2 96%   BMI 26.13 kg/m   Back to sinus rhythm. Care ongoing.

## 2023-04-18 NOTE — Progress Notes (Signed)
   04/18/23 0151  Vitals  Temp 98.9 F (37.2 C)  Temp Source Oral  BP (!) 140/57  MAP (mmHg) 80  BP Location Left Arm  BP Method Automatic  Patient Position (if appropriate) Lying  Pulse Rate 94  Pulse Rate Source Monitor  ECG Heart Rate 97  Resp (!) 30  Oxygen Therapy  SpO2 93 %  O2 Device Nasal Cannula  O2 Flow Rate (L/min) 3 L/min  Patient Activity (if Appropriate) In bed  Pulse Oximetry Type Continuous  During Treatment Monitoring  Duration of HD Treatment -hour(s) 3 hour(s)  Cumulative Fluid Removed (mL) per Treatment  2015.14  Post Treatment  Dialyzer Clearance Lightly streaked  Hemodialysis Intake (mL) 0 mL  Liters Processed 70.6  Fluid Removed (mL) 2000 mL  Tolerated HD Treatment Yes (Unable to meet prescribed UF goal d/t LE cramping; UF removal paused and sx resolved.)  Post-Hemodialysis Comments Treatment completed and blood returned without issue.  Note  Patient Observations Patient alert, no c/o voiced, no acute distress noted; patient condition stable upon this procedure discharge.  Fistula / Graft Right Upper arm  No placement date or time found.   Orientation: Right  Access Location: Upper arm  Site Condition No complications  Fistula / Graft Assessment Present;Thrill;Bruit  Status Deaccessed  Drainage Description None  Hemodialysis Catheter Right Internal jugular Double lumen Permanent (Tunneled)  Placement Date/Time: 03/10/23 1104   Placed prior to admission: No  Serial / Lot #: [id number]  Expiration Date: 08/08/27  Time Out: Correct patient;Correct site;Correct procedure  Maximum sterile barrier precautions: Hand hygiene;Cap;Mask;Sterile gown...  Site Condition No complications  Blue Lumen Status Flushed;Heparin  locked;Dead end cap in place  Red Lumen Status Flushed;Heparin  locked;Dead end cap in place  Purple Lumen Status N/A  Catheter fill solution Heparin  1000 units/ml  Catheter fill volume (Arterial) 1.6 cc  Catheter fill volume (Venous) 1.6   Dressing Type Transparent  Dressing Status Antimicrobial disc in place;Clean, Dry, Intact  Interventions Dressing changed  Drainage Description None  Dressing Change Due 04/25/23  Post treatment catheter status Capped and Clamped

## 2023-04-18 NOTE — TOC CM/SW Note (Signed)
 Transition of Care [facility]) - Inpatient Brief Assessment   Patient Details  Name: Rachel Villarreal MRN: [id number] Date of Birth: 03/21/1946  Transition of Care [facility]) CM/SW Contact:    Roxie KANDICE Stain, RN Phone Number: 04/18/2023, 2:50 PM   Clinical Narrative:  Patient is + FLU and has PNA. Cardiology recommends LHC when PNA is better.  TOC follow for needs.  Transition of Care Asessment: Insurance and Status: Insurance coverage has been reviewed Patient has primary care physician: Yes Home environment has been reviewed: safe to discharge home when medically stable Prior level of function:: independent Prior/Current Home Services: No current home services Social Drivers of Health Review: SDOH reviewed no interventions necessary Readmission risk has been reviewed: Yes Transition of care needs: no transition of care needs at this time

## 2023-04-18 NOTE — Progress Notes (Signed)
 Heart Failure Navigator Progress Note  Assessed for Heart & Vascular TOC clinic readiness.  Patient does not meet criteria due to ESRD on hemodialysis.   Navigator will sign off at this time.   Rhae Hammock, BSN, Scientist, clinical (histocompatibility and immunogenetics) Only

## 2023-04-18 NOTE — Progress Notes (Addendum)
 Progress Note  Patient Name: Rachel Villarreal Date of Encounter: 04/18/2023 Primary Cardiologist: None   Subjective   Overnight Echo was performed: pleural effusion noted. RVSP elevated 44 mm Hg with RV pressure volume overload.  Moderately reduced LVEF without WMA Patient notes that her breathing spells are improving. Still has so SOB.  HD last night.  Vital Signs    Vitals:   04/18/23 0437 04/18/23 0500 04/18/23 0600 04/18/23 0700  BP:  (!) 144/63 (!) 144/72 (!) 162/79  Pulse:  90 82 94  Resp:  (!) 23 (!) 27 (!) 21  Temp:    98.4 F (36.9 C)  TempSrc:    Oral  SpO2:  97% 100% 96%  Weight: 64.8 kg     Height:        Intake/Output Summary (Last 24 hours) at 04/18/2023 0812 Last data filed at 04/18/2023 0800 Gross per 24 hour  Intake 772.96 ml  Output 2000 ml  Net -1227.04 ml   Filed Weights   04/16/23 2236 04/18/23 0437  Weight: 66.9 kg 64.8 kg    Physical Exam   GEN: No acute distress.   Neck: + JVD Cardiac: RRR, systolic murmurs; no rubs, or gallops.  R arm Fistula Respiratory: Rales with rhonchi in the bases bilaterally GI: Soft, nontender, non-distended  MS: No edema  Labs   Telemetry: SR with PACs   Chemistry Recent Labs  Lab 04/16/23 1230 04/17/23 0018 04/18/23 0243  NA 138 134* 136  K 3.8 3.9 3.5  CL 97* 95* 96*  CO2 28 19* 26  GLUCOSE 124* 81 105*  BUN 48* 49* 23  CREATININE 6.24* 6.77* 3.90*  CALCIUM  9.1 8.6* 8.8*  PROT  --  5.4* 6.2*  ALBUMIN   --  2.6* 2.9*  AST  --  40 33  ALT  --  <5 7  ALKPHOS  --  71 81  BILITOT  --  1.6* 1.0  GFRNONAA 6* 6* 11*  ANIONGAP 13 20* 14     Hematology Recent Labs  Lab 04/16/23 1230 04/17/23 0628 04/18/23 0243  WBC 7.8 7.5 8.8  RBC 2.98* 2.97* 3.28*  HGB 9.5* 9.5* 10.3*  HCT 28.4* 28.4* 30.5*  MCV 95.3 95.6 93.0  MCH 31.9 32.0 31.4  MCHC 33.5 33.5 33.8  RDW 13.9 13.8 13.5  PLT 182 208 251    Cardiac EnzymesNo results for input(s): TROPONINI in the last 168 hours. No results for  input(s): TROPIPOC in the last 168 hours.   BNP Recent Labs  Lab 04/16/23 1230  BNP 3,699.7*     DDimer No results for input(s): DDIMER in the last 168 hours.   Cardiac Studies   Cardiac Studies & Procedures      ECHOCARDIOGRAM  ECHOCARDIOGRAM COMPLETE 04/17/2023  Narrative ECHOCARDIOGRAM REPORT    Patient Name:   Rachel Villarreal Date of Exam: 04/17/2023 Medical Rec #:  [id number]       Height:       62.0 in Accession #:    [id number]      Weight:       147.5 lb Date of Birth:  1945/07/05       BSA:          1.680 m Patient Age:    77 years        BP:           126/65 mmHg Patient Gender: F               HR:  96 bpm. Exam Location:  Inpatient  Procedure: 2D Echo, Cardiac Doppler, Color Doppler and Intracardiac Opacification Agent  Indications:    NSTEMI  History:        Patient has no prior history of Echocardiogram examinations. Risk Factors:Hypertension and Former Smoker.  Sonographer:    Ozell Free Referring Phys: [id number] GEORGANNA ARCHER   Sonographer Comments: Technically challenging study due to limited acoustic windows. IMPRESSIONS   1. Left ventricular ejection fraction, by estimation, is 35 to 40%. The left ventricle has moderately decreased function. The left ventricle demonstrates global hypokinesis. There is mild concentric left ventricular hypertrophy. Left ventricular diastolic parameters are indeterminate. There is the interventricular septum is flattened in systole and diastole, consistent with right ventricular pressure and volume overload. 2. Right ventricular systolic function is moderately reduced. The right ventricular size is moderately enlarged. There is mildly elevated pulmonary artery systolic pressure. The estimated right ventricular systolic pressure is 44.0 mmHg. 3. Left atrial size was severely dilated. 4. Right atrial size was moderately dilated. 5. The mitral valve is grossly normal. Mild mitral valve regurgitation. No  evidence of mitral stenosis. 6. Tricuspid valve regurgitation is mild to moderate. 7. The aortic valve is tricuspid. Aortic valve regurgitation is not visualized. No aortic stenosis is present. 8. The inferior vena cava is dilated in size with >50% respiratory variability, suggesting right atrial pressure of 8 mmHg.  FINDINGS Left Ventricle: Left ventricular ejection fraction, by estimation, is 35 to 40%. The left ventricle has moderately decreased function. The left ventricle demonstrates global hypokinesis. Definity  contrast agent was given IV to delineate the left ventricular endocardial borders. The left ventricular internal cavity size was normal in size. There is mild concentric left ventricular hypertrophy. The interventricular septum is flattened in systole and diastole, consistent with right ventricular pressure and volume overload. Left ventricular diastolic parameters are indeterminate.  Right Ventricle: The right ventricular size is moderately enlarged. No increase in right ventricular wall thickness. Right ventricular systolic function is moderately reduced. There is mildly elevated pulmonary artery systolic pressure. The tricuspid regurgitant velocity is 3.00 m/s, and with an assumed right atrial pressure of 8 mmHg, the estimated right ventricular systolic pressure is 44.0 mmHg.  Left Atrium: Left atrial size was severely dilated.  Right Atrium: Right atrial size was moderately dilated.  Pericardium: Trivial pericardial effusion is present.  Mitral Valve: The mitral valve is grossly normal. Mild mitral valve regurgitation. No evidence of mitral valve stenosis.  Tricuspid Valve: The tricuspid valve is grossly normal. Tricuspid valve regurgitation is mild to moderate. No evidence of tricuspid stenosis.  Aortic Valve: The aortic valve is tricuspid. Aortic valve regurgitation is not visualized. No aortic stenosis is present. Aortic valve mean gradient measures 7.0 mmHg. Aortic valve  peak gradient measures 15.5 mmHg. Aortic valve area, by VTI measures 1.73 cm.  Pulmonic Valve: The pulmonic valve was grossly normal. Pulmonic valve regurgitation is mild. No evidence of pulmonic stenosis.  Aorta: The aortic root and ascending aorta are structurally normal, with no evidence of dilitation.  Venous: The inferior vena cava is dilated in size with greater than 50% respiratory variability, suggesting right atrial pressure of 8 mmHg.  IAS/Shunts: The atrial septum is grossly normal.  Additional Comments: There is a small pleural effusion in the left lateral region.   LEFT VENTRICLE PLAX 2D LVIDd:         5.20 cm      Diastology LVIDs:         3.70 cm  LV e' medial:    6.85 cm/s LV PW:         1.30 cm      LV E/e' medial:  20.1 LV IVS:        1.20 cm      LV e' lateral:   10.00 cm/s LVOT diam:     2.00 cm      LV E/e' lateral: 13.8 LV SV:         66 LV SV Index:   39 LVOT Area:     3.14 cm  LV Volumes (MOD) LV vol d, MOD A2C: 102.0 ml LV vol d, MOD A4C: 86.1 ml LV vol s, MOD A2C: 40.7 ml LV vol s, MOD A4C: 45.4 ml LV SV MOD A2C:     61.3 ml LV SV MOD A4C:     86.1 ml LV SV MOD BP:      50.6 ml  RIGHT VENTRICLE             IVC RV Basal diam:  4.70 cm     IVC diam: 2.70 cm RV S prime:     12.00 cm/s TAPSE (M-mode): 2.3 cm  LEFT ATRIUM              Index        RIGHT ATRIUM           Index LA diam:        4.80 cm  2.86 cm/m   RA Area:     20.90 cm LA Vol (A2C):   111.0 ml 66.09 ml/m  RA Volume:   74.60 ml  44.42 ml/m LA Vol (A4C):   68.0 ml  40.49 ml/m LA Biplane Vol: 89.1 ml  53.05 ml/m AORTIC VALVE AV Area (Vmax):    1.58 cm AV Area (Vmean):   1.62 cm AV Area (VTI):     1.73 cm AV Vmax:           197.00 cm/s AV Vmean:          124.000 cm/s AV VTI:            0.381 m AV Peak Grad:      15.5 mmHg AV Mean Grad:      7.0 mmHg LVOT Vmax:         98.90 cm/s LVOT Vmean:        63.800 cm/s LVOT VTI:          0.210 m LVOT/AV VTI ratio:  0.55  AORTA Ao Root diam: 2.90 cm Ao Asc diam:  3.50 cm  MITRAL VALVE                TRICUSPID VALVE MV Area (PHT): 4.89 cm     TR Peak grad:   36.0 mmHg MV Decel Time: 155 msec     TR Vmax:        300.00 cm/s MV E velocity: 138.00 cm/s MV A velocity: 114.00 cm/s  SHUNTS MV E/A ratio:  1.21         Systemic VTI:  0.21 m Systemic Diam: 2.00 cm  Darryle Decent MD Electronically signed by Darryle Decent MD Signature Date/Time: 04/17/2023/2:06:18 PM    Final                  Assessment & Plan   NSTEMI- Type II suspected - Continue heparin  and aspirin  - Though she has no focal WMA; and this may be related to AFL, given her reduced LVEF, when she  is better from PNA would recommend LHC - additionally she has some evidence of PH vs RV pressure/volume overload related to her need for HD.  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  These include bleeding, infection, kidney damage, stroke, heart attack, death.  The patient understands these risks and is willing to proceed.  Access recommendations: Left radial (ESRD without L arm fistula, prior breast cancer  but OK to take BP from left arm) vs R femoral, Left AC Procedural considerations: Has AF; would be on triple therapy post cath  Timing would be based of her recovery from PNA.  Either 1/9 or 1/10 She has not been scheduled; will make NPO at midnight and if recovering will add on to 1/9 schedule tomorrow   Atrial Flutter - BB and AC for now   Viral Multifocal Pneumonia - Continue Tamiflu  as per primary   End-Stage Renal Disease - End-stage renal disease on hemodialysis. 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 Currently on anastrozole . No cardiotoxic chemotherapy noted   Baseline Anemia - no changes in therapy    For questions or updates, please contact [facility] Please consult [email or web address] for contact info under Cardiology/STEMI.      Stanly Leavens, MD FASE [facility] Cardiologist [facility]  [address] [place], [address] [place], [place] [address] [phone]  8:12 AM

## 2023-04-18 NOTE — Progress Notes (Signed)
 [facility]                                                                               Rachel Villarreal, is a 78 y.o. female, DOB - 03-Sep-1945, FMW:[id number] Admit date - 04/16/2023    Outpatient Primary MD for the patient is Husain, Karrar, MD  LOS - 2  days    Brief summary   Rachel Villarreal is a 78 y.o. female with medical history significant of HTN, ESRD on HD, breast CA admitted for SOB. She was found to have multifocal pneumonia, in the setting of influenza A infection. Hospitalization complicated by evidence of NSTEMI, In addition to atrial fibrillation with RVR.   Assessment & Plan    Assessment and Plan:     Non-ST elevated myocardial infarction [facility]) Continue with heparin  and aspirin .  Cardiology on board and recommending RHC. Probably tomorrow.    Acute on Chronic HFrEF Not in decompensation.   Paroxysmal atrial fibrillation with RVR (HCC) Rate controlled, continue with metoprolol  37.5 mg every 6 hours.   Influenza A with  multifocal pneumonia On tamiflu .  Continue with azithromycin  and ceftriaxone   to complete the course.  [place] oxygen to keep sats greater than 90%.   ESRD (end stage renal disease) (HCC) - on TTS.  - nephrology on board.    Essential hypertension Optimal BP parameters.  On hydralazine  25 mg TID.     Hyperlipidemia:  Continue with lipitor  80 mg daily.    Breast Cancer ER positive , PR positive.  Continue with anastrozole .    Anemia of chronic disease: Hemoglobin of 10.3 today.    Estimated body mass index is 26.13 kg/m as calculated from the following:   Height as of this encounter: 5' 2 (1.575 m).   Weight as of this encounter: 64.8 kg.  Code Status: full code.  DVT Prophylaxis:  IV heparin .   Level of Care: Level of care: Telemetry Cardiac Family Communication: none at bedside.   Disposition Plan:     Remains inpatient appropriate:  pending clinical improvement.   Procedures:  None.   Consultants:    Cardiology.  nephrology  Antimicrobials:   Anti-infectives (From admission, onward)    Start     Dose/Rate Route Frequency Ordered Stop   04/19/23 1800  oseltamivir  (TAMIFLU ) capsule 30 mg        30 mg Oral Every T-Th-Sa (1800) 04/18/23 0947 04/21/23 1759   04/17/23 2300  oseltamivir  (TAMIFLU ) capsule 30 mg        30 mg Oral Every T-Th-Sa (1800) 04/17/23 2101 04/18/23 0213   04/17/23 2200  oseltamivir  (TAMIFLU ) capsule 30 mg  Status:  Discontinued        30 mg Oral Daily at bedtime 04/17/23 0858 04/17/23 2101   04/17/23 1300  cefTRIAXone  (ROCEPHIN ) 2 g in sodium chloride  0.9 % 100 mL IVPB        2 g 200 mL/hr over 30 Minutes Intravenous Every 24 hours 04/17/23 1212 04/23/23 1259   04/17/23 1300  azithromycin  (ZITHROMAX ) tablet 500 mg        500 mg Oral Daily 04/17/23 1212 04/21/23 0959   04/17/23 1000  oseltamivir  (TAMIFLU ) capsule  30 mg  Status:  Discontinued        30 mg Oral Daily 04/16/23 2308 04/17/23 0858   04/16/23 1845  cefTRIAXone  (ROCEPHIN ) 1 g in sodium chloride  0.9 % 100 mL IVPB        1 g 200 mL/hr over 30 Minutes Intravenous  Once 04/16/23 1844 04/16/23 1930   04/16/23 1845  azithromycin  (ZITHROMAX ) 500 mg in sodium chloride  0.9 % 250 mL IVPB        500 mg 250 mL/hr over 60 Minutes Intravenous  Once 04/16/23 1844 04/16/23 2037   04/16/23 1545  oseltamivir  (TAMIFLU ) capsule 30 mg        30 mg Oral  Once 04/16/23 1543 04/16/23 1618        Medications  Scheduled Meds:  albuterol   5 mg Nebulization Once   anastrozole   1 mg Oral Daily   aspirin  EC  81 mg Oral Daily   [START ON 04/19/2023] atorvastatin   80 mg Oral q AM   azithromycin   500 mg Oral Daily   Chlorhexidine  Gluconate Cloth  6 each Topical Q0600   Chlorhexidine  Gluconate Cloth  6 each Topical Q0600   docusate sodium   100 mg Oral BID   febuxostat   40 mg Oral q AM   fluticasone   1 spray Each Nare Daily   hydrALAZINE   25 mg Oral Q8H   metoprolol  tartrate  37.5 mg Oral Q6H   mupirocin  ointment  1  Application Nasal BID   [START ON 04/19/2023] oseltamivir   30 mg Oral Q T,Th,Sat-1800   pantoprazole   40 mg Oral Daily   sevelamer  carbonate  800 mg Oral TID WC   Continuous Infusions:  cefTRIAXone  (ROCEPHIN )  IV 2 g (04/17/23 1236)   heparin  800 Units/hr (04/17/23 1713)   PRN Meds:.acetaminophen  **OR** acetaminophen , diclofenac  Sodium, levalbuterol , ondansetron  **OR** ondansetron  (ZOFRAN ) IV    Subjective:   [name] was seen and examined today.  Reports some sob, better than yesterday.   Objective:   Vitals:   04/18/23 0500 04/18/23 0600 04/18/23 0700 04/18/23 0800  BP: (!) 144/63 (!) 144/72 (!) 162/79 (!) 144/70  Pulse: 90 82 94 79  Resp: (!) 23 (!) 27 (!) 21 (!) 21  Temp:   98.4 F (36.9 C)   TempSrc:   Oral   SpO2: 97% 100% 96% 98%  Weight:      Height:        Intake/Output Summary (Last 24 hours) at 04/18/2023 1101 Last data filed at 04/18/2023 0800 Gross per 24 hour  Intake 772.96 ml  Output 2000 ml  Net -1227.04 ml   Filed Weights   04/16/23 2236 04/18/23 0437  Weight: 66.9 kg 64.8 kg     Exam General exam: Appears calm and comfortable  Respiratory system: basilar rhonchi bilateral on 4 lit of [place] oxygen.  Cardiovascular system: S1 & S2 heard,  irregularly irregular, tachycardia, JVD+ Gastrointestinal system: Abdomen is nondistended, soft and nontender.  Central nervous system: Alert and oriented. No focal neurological deficits. Extremities: Symmetric 5 x 5 power., RIGHT ARM FISTULA Skin: No rashes, Psychiatry: Mood & affect appropriate.     Data Reviewed:  I have personally reviewed following labs and imaging studies   CBC Lab Results  Component Value Date   WBC 8.8 04/18/2023   RBC 3.28 (L) 04/18/2023   HGB 10.3 (L) 04/18/2023   HCT 30.5 (L) 04/18/2023   MCV 93.0 04/18/2023   MCH 31.4 04/18/2023   PLT 251 04/18/2023   MCHC 33.8 04/18/2023  RDW 13.5 04/18/2023   LYMPHSABS 0.8 04/16/2023   MONOABS 0.8 04/16/2023   EOSABS 0.0 04/16/2023    BASOSABS 0.0 04/16/2023     Last metabolic panel Lab Results  Component Value Date   NA 136 04/18/2023   K 3.5 04/18/2023   CL 96 (L) 04/18/2023   CO2 26 04/18/2023   BUN 23 04/18/2023   CREATININE 3.90 (H) 04/18/2023   GLUCOSE 105 (H) 04/18/2023   GFRNONAA 11 (L) 04/18/2023   CALCIUM  8.8 (L) 04/18/2023   PHOS 9.1 (H) 03/10/2023   PROT 6.2 (L) 04/18/2023   ALBUMIN  2.9 (L) 04/18/2023   BILITOT 1.0 04/18/2023   ALKPHOS 81 04/18/2023   AST 33 04/18/2023   ALT 7 04/18/2023   ANIONGAP 14 04/18/2023    CBG (last 3)  No results for input(s): GLUCAP in the last 72 hours.    Coagulation Profile: No results for input(s): INR, PROTIME in the last 168 hours.   Radiology Studies: ECHOCARDIOGRAM COMPLETE Result Date: 04/17/2023    ECHOCARDIOGRAM REPORT   Patient Name:   TYSHAWNA ALARID Martin Date of Exam: 04/17/2023 Medical Rec #:  [id number]       Height:       62.0 in Accession #:    [id number]      Weight:       147.5 lb Date of Birth:  1945/08/07       BSA:          1.680 m Patient Age:    77 years        BP:           126/65 mmHg Patient Gender: F               HR:           96 bpm. Exam Location:  Inpatient Procedure: 2D Echo, Cardiac Doppler, Color Doppler and Intracardiac            Opacification Agent Indications:    NSTEMI  History:        Patient has no prior history of Echocardiogram examinations.                 Risk Factors:Hypertension and Former Smoker.  Sonographer:    Ozell Free Referring Phys: [id number] GEORGANNA ARCHER  Sonographer Comments: Technically challenging study due to limited acoustic windows. IMPRESSIONS  1. Left ventricular ejection fraction, by estimation, is 35 to 40%. The left ventricle has moderately decreased function. The left ventricle demonstrates global hypokinesis. There is mild concentric left ventricular hypertrophy. Left ventricular diastolic parameters are indeterminate. There is the interventricular septum is flattened in systole and diastole,  consistent with right ventricular pressure and volume overload.  2. Right ventricular systolic function is moderately reduced. The right ventricular size is moderately enlarged. There is mildly elevated pulmonary artery systolic pressure. The estimated right ventricular systolic pressure is 44.0 mmHg.  3. Left atrial size was severely dilated.  4. Right atrial size was moderately dilated.  5. The mitral valve is grossly normal. Mild mitral valve regurgitation. No evidence of mitral stenosis.  6. Tricuspid valve regurgitation is mild to moderate.  7. The aortic valve is tricuspid. Aortic valve regurgitation is not visualized. No aortic stenosis is present.  8. The inferior vena cava is dilated in size with >50% respiratory variability, suggesting right atrial pressure of 8 mmHg. FINDINGS  Left Ventricle: Left ventricular ejection fraction, by estimation, is 35 to 40%. The left ventricle has moderately decreased function. The left ventricle demonstrates  global hypokinesis. Definity  contrast agent was given IV to delineate the left ventricular endocardial borders. The left ventricular internal cavity size was normal in size. There is mild concentric left ventricular hypertrophy. The interventricular septum is flattened in systole and diastole, consistent with right ventricular pressure and volume overload. Left ventricular diastolic parameters are indeterminate. Right Ventricle: The right ventricular size is moderately enlarged. No increase in right ventricular wall thickness. Right ventricular systolic function is moderately reduced. There is mildly elevated pulmonary artery systolic pressure. The tricuspid regurgitant velocity is 3.00 m/s, and with an assumed right atrial pressure of 8 mmHg, the estimated right ventricular systolic pressure is 44.0 mmHg. Left Atrium: Left atrial size was severely dilated. Right Atrium: Right atrial size was moderately dilated. Pericardium: Trivial pericardial effusion is present.  Mitral Valve: The mitral valve is grossly normal. Mild mitral valve regurgitation. No evidence of mitral valve stenosis. Tricuspid Valve: The tricuspid valve is grossly normal. Tricuspid valve regurgitation is mild to moderate. No evidence of tricuspid stenosis. Aortic Valve: The aortic valve is tricuspid. Aortic valve regurgitation is not visualized. No aortic stenosis is present. Aortic valve mean gradient measures 7.0 mmHg. Aortic valve peak gradient measures 15.5 mmHg. Aortic valve area, by VTI measures 1.73  cm. Pulmonic Valve: The pulmonic valve was grossly normal. Pulmonic valve regurgitation is mild. No evidence of pulmonic stenosis. Aorta: The aortic root and ascending aorta are structurally normal, with no evidence of dilitation. Venous: The inferior vena cava is dilated in size with greater than 50% respiratory variability, suggesting right atrial pressure of 8 mmHg. IAS/Shunts: The atrial septum is grossly normal. Additional Comments: There is a small pleural effusion in the left lateral region.  LEFT VENTRICLE PLAX 2D LVIDd:         5.20 cm      Diastology LVIDs:         3.70 cm      LV e' medial:    6.85 cm/s LV PW:         1.30 cm      LV E/e' medial:  20.1 LV IVS:        1.20 cm      LV e' lateral:   10.00 cm/s LVOT diam:     2.00 cm      LV E/e' lateral: 13.8 LV SV:         66 LV SV Index:   39 LVOT Area:     3.14 cm  LV Volumes (MOD) LV vol d, MOD A2C: 102.0 ml LV vol d, MOD A4C: 86.1 ml LV vol s, MOD A2C: 40.7 ml LV vol s, MOD A4C: 45.4 ml LV SV MOD A2C:     61.3 ml LV SV MOD A4C:     86.1 ml LV SV MOD BP:      50.6 ml RIGHT VENTRICLE             IVC RV Basal diam:  4.70 cm     IVC diam: 2.70 cm RV S prime:     12.00 cm/s TAPSE (M-mode): 2.3 cm LEFT ATRIUM              Index        RIGHT ATRIUM           Index LA diam:        4.80 cm  2.86 cm/m   RA Area:     20.90 cm LA Vol (A2C):   111.0 ml 66.09 ml/m  RA Volume:  74.60 ml  44.42 ml/m LA Vol (A4C):   68.0 ml  40.49 ml/m LA Biplane Vol:  89.1 ml  53.05 ml/m  AORTIC VALVE AV Area (Vmax):    1.58 cm AV Area (Vmean):   1.62 cm AV Area (VTI):     1.73 cm AV Vmax:           197.00 cm/s AV Vmean:          124.000 cm/s AV VTI:            0.381 m AV Peak Grad:      15.5 mmHg AV Mean Grad:      7.0 mmHg LVOT Vmax:         98.90 cm/s LVOT Vmean:        63.800 cm/s LVOT VTI:          0.210 m LVOT/AV VTI ratio: 0.55  AORTA Ao Root diam: 2.90 cm Ao Asc diam:  3.50 cm MITRAL VALVE                TRICUSPID VALVE MV Area (PHT): 4.89 cm     TR Peak grad:   36.0 mmHg MV Decel Time: 155 msec     TR Vmax:        300.00 cm/s MV E velocity: 138.00 cm/s MV A velocity: 114.00 cm/s  SHUNTS MV E/A ratio:  1.21         Systemic VTI:  0.21 m                             Systemic Diam: 2.00 cm Darryle Decent MD Electronically signed by Darryle Decent MD Signature Date/Time: 04/17/2023/2:06:18 PM    Final    CT Angio Chest PE W and/or Wo Contrast Result Date: 04/16/2023 CLINICAL DATA:  Pulmonary embolus suspected with high probability. History of asthma. Cold-like symptoms for a week. Shortness of breath for 3 days. EXAM: CT ANGIOGRAPHY CHEST WITH CONTRAST TECHNIQUE: Multidetector CT imaging of the chest was performed using the standard protocol during bolus administration of intravenous contrast. Multiplanar CT image reconstructions and MIPs were obtained to evaluate the vascular anatomy. RADIATION DOSE REDUCTION: This exam was performed according to the departmental dose-optimization program which includes automated exposure control, adjustment of the mA and/or kV according to patient size and/or use of iterative reconstruction technique. CONTRAST:  75mL OMNIPAQUE  IOHEXOL  350 MG/ML SOLN COMPARISON:  CT chest abdomen and pelvis 11/23/2022 FINDINGS: Cardiovascular: Technically adequate study with moderately good opacification of the central and segmental pulmonary arteries. Mild motion artifact. No focal filling defects are identified. No evidence of significant pulmonary  embolus. Right central venous catheter with tip in the low SVC. Mild cardiac enlargement. No pericardial effusions. Normal caliber thoracic aorta. No aortic dissection. Calcification of the aorta and coronary arteries. Mediastinum/Nodes: Thyroid  gland is unremarkable. Esophagus is decompressed. Mediastinal lymph nodes are not pathologically enlarged, likely reactive. Lungs/Pleura: Small bilateral pleural effusions with basilar atelectasis. Patchy airspace disease throughout both lungs with bronchial wall thickening likely representing multifocal pneumonia or possibly aspiration. Underlying airways disease may be present. Upper Abdomen: No acute abnormalities demonstrated. Musculoskeletal: Degenerative changes in the spine. No acute bony abnormalities. Review of the MIP images confirms the above findings. IMPRESSION: 1. No evidence of significant pulmonary embolus. 2. Mild cardiac enlargement. 3. Aortic atherosclerosis. 4. Small bilateral pleural effusions. 5. Patchy airspace disease throughout both lungs with bronchial wall thickening likely representing multifocal pneumonia, possibly aspiration, possibly with underlying  airways disease. Electronically Signed   By: Elsie Gravely M.D.   On: 04/16/2023 18:11   DG Chest 2 View Result Date: 04/16/2023 CLINICAL DATA:  Shortness of breath for 3 days, worse with ambulation. Cold-like symptoms last week. Hemodialysis patient. EXAM: CHEST - 2 VIEW COMPARISON:  Radiographs 08/23/2022 and 12/21/2020.  CT 11/23/2022. FINDINGS: Right IJ hemodialysis catheter projects over the upper right atrium. The heart size and mediastinal contours are stable. Increased mildly heterogeneous perihilar opacities in both lungs compared with prior radiographs. No confluent airspace disease or significant pleural effusion identified. There is chronic scarring in the lingula. No pneumothorax. The bones appear unchanged. IMPRESSION: Increased mildly heterogeneous perihilar opacities in both  lungs, possibly mild edema or atypical infection. No confluent airspace disease or significant pleural effusion. Short-term radiographic follow-up recommended if the patient remains symptomatic. Electronically Signed   By: Elsie Perone M.D.   On: 04/16/2023 13:40       Elgie Butter M.D. [facility] 04/18/2023, 11:01 AM  Available via Epic secure chat 7am-7pm After 7 pm, please refer to night coverage provider listed on amion.

## 2023-04-19 ENCOUNTER — Inpatient Hospital Stay (HOSPITAL_COMMUNITY): Payer: Medicare Other

## 2023-04-19 DIAGNOSIS — I10 Essential (primary) hypertension: Secondary | ICD-10-CM | POA: Diagnosis not present

## 2023-04-19 DIAGNOSIS — N186 End stage renal disease: Secondary | ICD-10-CM | POA: Diagnosis not present

## 2023-04-19 DIAGNOSIS — I214 Non-ST elevation (NSTEMI) myocardial infarction: Secondary | ICD-10-CM | POA: Diagnosis not present

## 2023-04-19 DIAGNOSIS — I480 Paroxysmal atrial fibrillation: Secondary | ICD-10-CM | POA: Diagnosis not present

## 2023-04-19 LAB — BASIC METABOLIC PANEL
Anion gap: 14 (ref 5–15)
BUN: 46 mg/dL — ABNORMAL HIGH (ref 8–23)
CO2: 25 mmol/L (ref 22–32)
Calcium: 9.3 mg/dL (ref 8.9–10.3)
Chloride: 96 mmol/L — ABNORMAL LOW (ref 98–111)
Creatinine, Ser: 6.45 mg/dL — ABNORMAL HIGH (ref 0.44–1.00)
GFR, Estimated: 6 mL/min — ABNORMAL LOW (ref >60)
Glucose, Bld: 185 mg/dL — ABNORMAL HIGH (ref 70–99)
Potassium: 4.3 mmol/L (ref 3.5–5.1)
Sodium: 135 mmol/L (ref 135–145)

## 2023-04-19 LAB — HEPARIN LEVEL (UNFRACTIONATED): Heparin Unfractionated: 0.25 [IU]/mL — ABNORMAL LOW (ref 0.30–0.70)

## 2023-04-19 LAB — CBC
HCT: 33.4 % — ABNORMAL LOW (ref 36.0–46.0)
Hemoglobin: 11.3 g/dL — ABNORMAL LOW (ref 12.0–15.0)
MCH: 31.5 pg (ref 26.0–34.0)
MCHC: 33.8 g/dL (ref 30.0–36.0)
MCV: 93 fL (ref 80.0–100.0)
Platelets: 307 10*3/uL (ref 150–400)
RBC: 3.59 MIL/uL — ABNORMAL LOW (ref 3.87–5.11)
RDW: 13.6 % (ref 11.5–15.5)
WBC: 13.5 10*3/uL — ABNORMAL HIGH (ref 4.0–10.5)
nRBC: 0 % (ref 0.0–0.2)

## 2023-04-19 LAB — BLOOD GAS, ARTERIAL
Acid-Base Excess: 0.3 mmol/L (ref 0.0–2.0)
Bicarbonate: 24.6 mmol/L (ref 20.0–28.0)
Drawn by: 33176
O2 Saturation: 89.1 %
Patient temperature: 36.6
pCO2 arterial: 37 mm[Hg] (ref 32–48)
pH, Arterial: 7.43 (ref 7.35–7.45)
pO2, Arterial: 53 mm[Hg] — ABNORMAL LOW (ref 83–108)

## 2023-04-19 LAB — LACTIC ACID, PLASMA: Lactic Acid, Venous: 1 mmol/L (ref 0.5–1.9)

## 2023-04-19 LAB — MAGNESIUM: Magnesium: 2.3 mg/dL (ref 1.7–2.4)

## 2023-04-19 MED ORDER — MIDODRINE HCL 5 MG PO TABS
10.0000 mg | ORAL_TABLET | ORAL | Status: AC
  Administered 2023-04-19: 10 mg via ORAL
  Filled 2023-04-19: qty 2

## 2023-04-19 MED ORDER — TRAZODONE HCL 50 MG PO TABS
25.0000 mg | ORAL_TABLET | Freq: Every day | ORAL | Status: DC
  Administered 2023-04-19 – 2023-04-24 (×7): 25 mg via ORAL
  Filled 2023-04-19 (×7): qty 1

## 2023-04-19 MED ORDER — PREDNISONE 20 MG PO TABS
20.0000 mg | ORAL_TABLET | Freq: Every day | ORAL | Status: AC
Start: 2023-04-25 — End: 2023-04-30
  Administered 2023-04-25: 20 mg via ORAL
  Filled 2023-04-19: qty 1

## 2023-04-19 MED ORDER — IPRATROPIUM BROMIDE 0.02 % IN SOLN: 0.5000 mg | RESPIRATORY_TRACT | Status: DC

## 2023-04-19 MED ORDER — AMIODARONE LOAD VIA INFUSION
150.0000 mg | Freq: Once | INTRAVENOUS | Status: AC
  Administered 2023-04-19: 150 mg via INTRAVENOUS
  Filled 2023-04-19: qty 83.34

## 2023-04-19 MED ORDER — PREDNISONE 20 MG PO TABS
40.0000 mg | ORAL_TABLET | Freq: Every day | ORAL | Status: AC
  Administered 2023-04-20 – 2023-04-24 (×5): 40 mg via ORAL
  Filled 2023-04-19 (×5): qty 2

## 2023-04-19 MED ORDER — PIPERACILLIN-TAZOBACTAM IN DEX 2-0.25 GM/50ML IV SOLN
2.2500 g | Freq: Three times a day (TID) | INTRAVENOUS | Status: AC
  Administered 2023-04-19 – 2023-04-24 (×14): 2.25 g via INTRAVENOUS
  Filled 2023-04-19 (×16): qty 50

## 2023-04-19 MED ORDER — AMIODARONE IV BOLUS ONLY 150 MG/100ML: 150.0000 mg | Freq: Once | INTRAVENOUS | Status: DC

## 2023-04-19 MED ORDER — MAGNESIUM SULFATE 2 GM/50ML IV SOLN
2.0000 g | Freq: Once | INTRAVENOUS | Status: AC
  Administered 2023-04-19: 2 g via INTRAVENOUS
  Filled 2023-04-19: qty 50

## 2023-04-19 MED ORDER — AMIODARONE HCL IN DEXTROSE 360-4.14 MG/200ML-% IV SOLN
30.0000 mg/h | INTRAVENOUS | Status: DC
  Administered 2023-04-19 – 2023-04-20 (×2): 30 mg/h via INTRAVENOUS
  Filled 2023-04-19 (×4): qty 200

## 2023-04-19 MED ORDER — LEVALBUTEROL HCL 0.63 MG/3ML IN NEBU: 0.6300 mg | INHALATION_SOLUTION | RESPIRATORY_TRACT | Status: DC

## 2023-04-19 MED ORDER — IPRATROPIUM BROMIDE 0.02 % IN SOLN
0.5000 mg | Freq: Once | RESPIRATORY_TRACT | Status: AC
  Administered 2023-04-19: 0.5 mg via RESPIRATORY_TRACT
  Filled 2023-04-19: qty 2.5

## 2023-04-19 MED ORDER — ARFORMOTEROL TARTRATE 15 MCG/2ML IN NEBU
15.0000 ug | INHALATION_SOLUTION | Freq: Two times a day (BID) | RESPIRATORY_TRACT | Status: DC
  Administered 2023-04-19 – 2023-04-25 (×12): 15 ug via RESPIRATORY_TRACT
  Filled 2023-04-19 (×13): qty 2

## 2023-04-19 MED ORDER — DOXYCYCLINE HYCLATE 100 MG PO TABS
100.0000 mg | ORAL_TABLET | Freq: Two times a day (BID) | ORAL | Status: AC
  Administered 2023-04-19 – 2023-04-23 (×10): 100 mg via ORAL
  Filled 2023-04-19 (×11): qty 1

## 2023-04-19 MED ORDER — IPRATROPIUM-ALBUTEROL 0.5-2.5 (3) MG/3ML IN SOLN: 3.0000 mL | RESPIRATORY_TRACT | Status: DC

## 2023-04-19 MED ORDER — FUROSEMIDE 10 MG/ML IJ SOLN
40.0000 mg | Freq: Once | INTRAMUSCULAR | Status: AC
  Administered 2023-04-19: 40 mg via INTRAVENOUS
  Filled 2023-04-19: qty 4

## 2023-04-19 MED ORDER — BUDESONIDE 0.5 MG/2ML IN SUSP
0.5000 mg | Freq: Two times a day (BID) | RESPIRATORY_TRACT | Status: DC
  Administered 2023-04-19 – 2023-04-25 (×12): 0.5 mg via RESPIRATORY_TRACT
  Filled 2023-04-19 (×13): qty 2

## 2023-04-19 MED ORDER — AMIODARONE HCL IN DEXTROSE 360-4.14 MG/200ML-% IV SOLN: 60.0000 mg/h | INTRAVENOUS | Status: DC

## 2023-04-19 MED ORDER — HEPARIN (PORCINE) 25000 UT/250ML-% IV SOLN
1200.0000 [IU]/h | INTRAVENOUS | Status: DC
Start: 2023-04-19 — End: 2023-04-23
  Administered 2023-04-19: 1000 [IU]/h via INTRAVENOUS
  Administered 2023-04-21 – 2023-04-23 (×2): 1200 [IU]/h via INTRAVENOUS
  Filled 2023-04-19 (×5): qty 250

## 2023-04-19 MED ORDER — METHYLPREDNISOLONE SODIUM SUCC 125 MG IJ SOLR
60.0000 mg | INTRAMUSCULAR | Status: AC
  Administered 2023-04-19: 60 mg via INTRAVENOUS
  Filled 2023-04-19: qty 2

## 2023-04-19 MED ORDER — HEPARIN SODIUM (PORCINE) 1000 UNIT/ML IJ SOLN
INTRAMUSCULAR | Status: AC
  Administered 2023-04-19: 1000 [IU]
  Filled 2023-04-19: qty 4

## 2023-04-19 MED ORDER — MIDODRINE HCL 5 MG PO TABS
10.0000 mg | ORAL_TABLET | Freq: Once | ORAL | Status: DC
  Filled 2023-04-19: qty 2

## 2023-04-19 MED ORDER — AMIODARONE HCL IN DEXTROSE 360-4.14 MG/200ML-% IV SOLN
60.0000 mg/h | INTRAVENOUS | Status: AC
  Administered 2023-04-19: 60 mg/h via INTRAVENOUS
  Filled 2023-04-19: qty 200

## 2023-04-19 MED ORDER — PREDNISONE 20 MG PO TABS: 40.0000 mg | ORAL_TABLET | Freq: Every day | ORAL | Status: DC

## 2023-04-19 MED ORDER — METOPROLOL TARTRATE 12.5 MG HALF TABLET
12.5000 mg | ORAL_TABLET | ORAL | Status: AC
  Administered 2023-04-19: 12.5 mg via ORAL
  Filled 2023-04-19: qty 1

## 2023-04-19 MED ORDER — AMIODARONE HCL IN DEXTROSE 360-4.14 MG/200ML-% IV SOLN: 30.0000 mg/h | INTRAVENOUS | Status: DC

## 2023-04-19 MED ORDER — MIDODRINE HCL 5 MG PO TABS
10.0000 mg | ORAL_TABLET | Freq: Three times a day (TID) | ORAL | Status: DC
  Administered 2023-04-19: 10 mg via ORAL
  Filled 2023-04-19: qty 2

## 2023-04-19 NOTE — Consult Note (Signed)
 NAME:  Rachel Villarreal, MRN:  [id number], DOB:  1945/05/06, LOS: 3 ADMISSION DATE:  04/16/2023, CONSULTATION DATE:  04/18/22 REFERRING MD:  Cherlyn, CHIEF COMPLAINT:  SOB   History of Present Illness:  Seen for worsening resp failure.   No home O2, MMRC 0, hx asthma Worsening SOB found to have influenza, multifocal PNA, and Afib/RVR Yesterday was trying to swallow a big pill that got stuck then she had a big episode of emesis. Now having a little higher O2 needs so PCCM consulted. Denies prior aspiration issues. Labs/imaging/current meds reviewed  Pertinent  Medical History   Past Medical History:  Diagnosis Date   Abnormal Pap smear of vagina    Anemia    Asthma    Mild   Cancer (HCC)    left breast ILC   Chronic kidney disease (CKD)    stage 4   GERD (gastroesophageal reflux disease)    Gout 05/2012   Hernia, incisional    History of abnormal Pap smear    CIN III, 2001   History of blood transfusion    POST OP   History of uterine fibroid    Hypertension    Peritonitis (HCC)    Respiratory failure (HCC)    after ruptured appendix   Ruptured appendix    Seasonal allergies    Shingles 02/2013   very mild case   Urine incontinence      Significant Hospital Events: Including procedures, antibiotic start and stop dates in addition to other pertinent events   1/7 admit 1/9 pulm consult  Interim History / Subjective:  Pulm consult  Objective   Blood pressure 119/87, pulse (!) 127, temperature 97.9 F (36.6 C), temperature source Axillary, resp. rate (!) 27, height 5' 2 (1.575 m), weight 64.8 kg, SpO2 95%.        Intake/Output Summary (Last 24 hours) at 04/19/2023 1030 Last data filed at 04/18/2023 1302 Gross per 24 hour  Intake 240 ml  Output --  Net 240 ml   Filed Weights   04/16/23 2236 04/18/23 0437  Weight: 66.9 kg 64.8 kg    Examination: General: no distress HENT: MMM, trachea midline Lungs: harsh rhonci bilaterally, mild tachypnea Cardiovascular:  Tachy, irregular ext warm Abdomen: soft, hypoactive BS Extremities: trace edema, LUE fistula Neuro: Moves to command, pleasant Skin: no rashes  Patient Lines/Drains/Airways Status     Active Line/Drains/Airways     Name Placement date Placement time Site Days   Peripheral IV 04/16/23 22 G 1 Anterior;Left Forearm 04/16/23  1447  Forearm  3   Peripheral IV 04/19/23 20 G 1.88 Left;Anterior Forearm 04/19/23  0513  Forearm  less than 1   Fistula / Graft Left Upper arm Arteriovenous fistula 04/24/22  0817  Upper arm  360   Fistula / Graft Right Upper arm --  --  Upper arm  --   Hemodialysis Catheter Right Internal jugular Double lumen Permanent (Tunneled) 03/10/23  1104  Internal jugular  40   Incision (Closed) 04/17/23 Arm Anterior;Right;Upper 04/17/23  1347  -- 2            Resolved Hospital Problem list   N/A  Assessment & Plan:  Presumed CAP Asthma in flare Influenza A Afib/RVR 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 Hx ESRD on HD  - HD as neg as tolerated by hemodynamics - Switch nebs to brovana /pulmicort  - doxy/zosyn  should work x 5 days - Prednisone  taper as ordered - IS, flutter, mobility - Afib and cardiomyopathy workup per cardiology -  May benefit from OP pulm f/u - Wean O2 for sats > 90% - Will check on tomorrow to assure heading in right direction  Best Practice (right click and Reselect all SmartList Selections daily)  Per primary  Labs   CBC: Recent Labs  Lab 04/13/23 0821 04/16/23 1230 04/17/23 0628 04/18/23 0243 04/19/23 0226  WBC  --  7.8 7.5 8.8 13.5*  NEUTROABS  --  6.1  --   --   --   HGB 9.9* 9.5* 9.5* 10.3* 11.3*  HCT 29.0* 28.4* 28.4* 30.5* 33.4*  MCV  --  95.3 95.6 93.0 93.0  PLT  --  182 208 251 307    Basic Metabolic Panel: Recent Labs  Lab 04/13/23 0821 04/16/23 1230 04/17/23 0018 04/18/23 0243  NA 140 138 134* 136  K 3.8 3.8 3.9 3.5  CL 102 97* 95* 96*  CO2  --  28 19* 26  GLUCOSE 97 124* 81 105*  BUN 23 48* 49* 23   CREATININE 4.10* 6.24* 6.77* 3.90*  CALCIUM   --  9.1 8.6* 8.8*  MG  --   --  2.1  --    GFR: Estimated Creatinine Clearance: 10.3 mL/min (A) (by C-G formula based on SCr of 3.9 mg/dL (H)). Recent Labs  Lab 04/16/23 1230 04/17/23 0018 04/17/23 0628 04/18/23 0243 04/19/23 0226  PROCALCITON  --  3.18  --   --   --   WBC 7.8  --  7.5 8.8 13.5*  LATICACIDVEN  --   --  1.0  --  1.0    Liver Function Tests: Recent Labs  Lab 04/17/23 0018 04/18/23 0243  AST 40 33  ALT <5 7  ALKPHOS 71 81  BILITOT 1.6* 1.0  PROT 5.4* 6.2*  ALBUMIN  2.6* 2.9*   No results for input(s): LIPASE, AMYLASE in the last 168 hours. No results for input(s): AMMONIA in the last 168 hours.  ABG    Component Value Date/Time   PHART 7.43 04/19/2023 0939   PCO2ART 37 04/19/2023 0939   PO2ART 53 (L) 04/19/2023 0939   HCO3 24.6 04/19/2023 0939   TCO2 26 04/13/2023 0821   O2SAT 89.1 04/19/2023 0939     Coagulation Profile: No results for input(s): INR, PROTIME in the last 168 hours.  Cardiac Enzymes: No results for input(s): CKTOTAL, CKMB, CKMBINDEX, TROPONINI in the last 168 hours.  HbA1C: Hgb A1c MFr Bld  Date/Time Value Ref Range Status  04/17/2023 06:28 AM 5.2 4.8 - 5.6 % Final    Comment:    (NOTE)         Prediabetes: 5.7 - 6.4         Diabetes: >6.4         Glycemic control for adults with diabetes: <7.0     CBG: No results for input(s): GLUCAP in the last 168 hours.  Review of Systems:    Positive Symptoms in bold:  Constitutional fevers, chills, weight loss, fatigue, anorexia, malaise  Eyes decreased vision, double vision, eye irritation  Ears, Nose, Mouth, Throat sore throat, trouble swallowing, sinus congestion  Cardiovascular chest pain, paroxysmal nocturnal dyspnea, lower ext edema, palpitations   Respiratory SOB, cough, DOE, hemoptysis, wheezing  Gastrointestinal nausea, vomiting, diarrhea  Genitourinary burning with urination, trouble urinating   Musculoskeletal joint aches, joint swelling, back pain  Integumentary  rashes, skin lesions  Neurological focal weakness, focal numbness, trouble speaking, headaches  Psychiatric depression, anxiety, confusion  Endocrine polyuria, polydipsia, cold intolerance, heat intolerance  Hematologic abnormal bruising, abnormal bleeding,  unexplained nose bleeds  Allergic/Immunologic recurrent infections, hives, swollen lymph nodes     Past Medical History:  She,  has a past medical history of Abnormal Pap smear of vagina, Anemia, Asthma, Cancer (HCC), Chronic kidney disease (CKD), GERD (gastroesophageal reflux disease), Gout (05/2012), Hernia, incisional, History of abnormal Pap smear, History of blood transfusion, History of uterine fibroid, Hypertension, Peritonitis (HCC), Respiratory failure (HCC), Ruptured appendix, Seasonal allergies, Shingles (02/2013), and Urine incontinence.   Surgical History:   Past Surgical History:  Procedure Laterality Date   A/V FISTULAGRAM Right 03/19/2023   Procedure: A/V Fistulagram;  Surgeon: Sheree Penne Bruckner, MD;  Location: [facility] INVASIVE CV LAB;  Service: Cardiovascular;  Laterality: Right;   APPENDECTOMY     AV FISTULA PLACEMENT Right 04/24/2022   Procedure: RIGHT BRACHIOCEPHALIC ARTERIOVENOUS (AV) FISTULA CREATION;  Surgeon: Eliza Bruckner RAMAN, MD;  Location: [facility] OR;  Service: Vascular;  Laterality: Right;   BREAST BIOPSY Right    fibrocystic changes   BREAST BIOPSY Left 08/02/2021   BREAST LUMPECTOMY WITH RADIOACTIVE SEED LOCALIZATION Left 09/12/2021   Procedure: LEFT BREAST LUMPECTOMY WITH RADIOACTIVE SEED LOCALIZATION;  Surgeon: Ebbie Cough, MD;  Location: [facility];  Service: General;  Laterality: Left;   CATARACT EXTRACTION Bilateral    COLONOSCOPY     COLONOSCOPY WITH PROPOFOL  N/A 02/18/2018   Procedure: COLONOSCOPY WITH PROPOFOL ;  Surgeon: Rosalie Kitchens, MD;  Location: [facility] ENDOSCOPY;  Service: Endoscopy;  Laterality: N/A;   Use ultraslim scope    FISTULA SUPERFICIALIZATION Right 04/13/2023   Procedure: FISTULA SUPERFICIALIZATION;  Surgeon: Sheree Penne Bruckner, MD;  Location: [facility] OR;  Service: Vascular;  Laterality: Right;   IR FLUORO GUIDE CV LINE RIGHT  03/10/2023   LEEP  2000   CIN 2/3   RE-EXCISION OF BREAST LUMPECTOMY Left 09/29/2021   Procedure: LEFT  BREAST RE-EXCISION LUMPECTOMY;  Surgeon: Ebbie Cough, MD;  Location: [facility] ORS;  Service: General;  Laterality: Left;   REFRACTIVE SURGERY     SUPRACERVICAL ABDOMINAL HYSTERECTOMY  2001     Social History:   reports that she quit smoking about 51 years ago. Her smoking use included cigarettes. She has never used smokeless tobacco. She reports that she does not drink alcohol  and does not use drugs.   Family History:  Her family history includes Colon cancer in her father. There is no history of Breast cancer.   Allergies Allergies  Allergen Reactions   Ace Inhibitors     angioedema   Lactose Intolerance (Gi) Other (See Comments)    GI upset   Letrozole  Hives   Lisinopril Swelling   Mercury     Red eyes, through eye drops that contained thimerosal    Tape Dermatitis    plastic     Home Medications  Prior to Admission medications   Medication Sig Start Date End Date Taking? Authorizing Provider  acetaminophen  (TYLENOL ) 500 MG tablet Take 500 mg by mouth every 6 (six) hours as needed for mild pain (pain score 1-3).   Yes [provider]  amLODipine  (NORVASC ) 5 MG tablet Take 5 mg by mouth in the morning. 02/15/23  Yes [provider]  anastrozole  (ARIMIDEX ) 1 MG tablet Take 1 tablet (1 mg total) by mouth daily. 09/07/22  Yes Gudena, Vinay, MD  atorvastatin  (LIPITOR ) 10 MG tablet Take 10 mg by mouth in the morning.   Yes [provider]  budesonide  (RHINOCORT  AQUA) 32 MCG/ACT nasal spray Place 2 sprays into both nostrils in the morning. 12/12/22  Yes [provider]  Cholecalciferol 50 MCG (2000 UT) CAPS Take  2,000 Units by mouth in the morning. 12/27/22  Yes [provider]  diclofenac  Sodium (VOLTAREN ) 1 % GEL Apply 1 Application topically 4 (four) times daily as needed (pain.).   Yes [provider]  eszopiclone (LUNESTA) 2 MG TABS tablet Take 2 mg by mouth at bedtime. 03/27/23  Yes [provider]  febuxostat  (ULORIC ) 40 MG tablet Take 40 mg by mouth in the morning.   Yes [provider]  furosemide  (LASIX ) 80 MG tablet Take 80 mg by mouth See admin instructions. Non dialysis days. (Sunday, Monday, Wednesday, and Friday) 03/26/23  Yes [provider]  loratadine  (CLARITIN ) 10 MG tablet Take 10 mg by mouth 2 (two) times daily. 12/12/22  Yes [provider]  losartan  (COZAAR ) 100 MG tablet Take 100 mg by mouth in the morning. 07/27/21  Yes [provider]  metoprolol  succinate (TOPROL -XL) 50 MG 24 hr tablet Take 50 mg by mouth at bedtime. At bedtime   Yes [provider]  Multiple Vitamins-Minerals (PRESERVISION AREDS 2+MULTI VIT PO) Take 1 capsule by mouth 2 (two) times daily.   Yes [provider]  pantoprazole  (PROTONIX ) 40 MG tablet Take 40 mg by mouth daily.   Yes [provider]  PROAIR  HFA 108 (90 BASE) MCG/ACT inhaler Inhale 1-2 puffs into the lungs every 6 (six) hours as needed for shortness of breath or wheezing (for respiratory issues.). 12/03/14  Yes [provider]  sevelamer  carbonate (RENVELA ) 800 MG tablet Take 800 mg by mouth 3 (three) times daily with meals. 03/15/23  Yes [provider]     Critical care time: N/A

## 2023-04-19 NOTE — Progress Notes (Signed)
   04/19/23 0038  Assess: MEWS Score  BP (!) 148/99  MAP (mmHg) 109  Pulse Rate (!) 144  ECG Heart Rate (!) 145  Resp (!) 21  SpO2 93 %  O2 Device Nasal Cannula  O2 Flow Rate (L/min) 4 L/min  Assess: MEWS Score  MEWS Temp 0  MEWS Systolic 0  MEWS Pulse 3  MEWS RR 1  MEWS LOC 0  MEWS Score 4  MEWS Score Color Red  Assess: if the MEWS score is Yellow or Red  Were vital signs accurate and taken at a resting state? Yes  MEWS guidelines implemented  No, previously red, continue vital signs every 4 hours  Notify: Charge Nurse/RN  Name of Charge Nurse/RN Notified Tanya RN  Provider Notification  Provider Name/Title DR Shona  Date Provider Notified 04/19/23  Time Provider Notified [phone]  Method of Notification Page  Date of Provider Response 04/19/23  Time of Provider Response 0040  Assess: SIRS CRITERIA  SIRS Temperature  0  SIRS Respirations  1  SIRS Pulse 1  SIRS WBC 0  SIRS Score Sum  2   DR Shona came at bedside.  -Lasix , midodrine , C-xray, labs

## 2023-04-19 NOTE — Progress Notes (Signed)
 [facility] Progress Note  Subjective: seen in room. Has worsening afib/ RVR and worsening SOB. Started on IV amio drip. HR 150s earlier this am, now is back in NSR at 1:30pm. O2 requirements rising to 8L HFNC this am.   Vitals:   04/19/23 1116 04/19/23 1121 04/19/23 1122 04/19/23 1138  BP: 124/62     Pulse: 76     Resp: 20     Temp: 97.8 F (36.6 C)     TempSrc: Axillary     SpO2: 94% 93% 93% 95%  Weight:      Height:        Exam: Gen alert, ^wob compared to yest No rash, cyanosis or gangrene Sclera anicteric, throat clear  No jvd or bruits Chest occ crackles bilat RRR no MRG Abd soft ntnd no mass or ascites +bs Ext no LE or UE edema, no wounds or ulcers Neuro is alert, Ox 3 , nf    R AVF +bruit, recent wound healing/ RIJ TDC    OP HD: NW TTS 4h   400/1.5   2/2.5 bath  64 kg  Heparin  4000   Right TDC (revised RUE AVF)  - mircera 50 mcg q 2, last 1/02, due 1/16     Assessment/ Plan: Acute hypoxic resp failure - due to pna. Getting 2-3 L . Not on home O2. No vol overload on exam. Got UF 2 L w/ HD Tuesday. Plan HD today w/ further volume removal as tolerated.  Flu A/ multifocal pna - by CXR and CT chest. Per pmd Afib / RVR - affecting her condition. Started IV amio and is back in NSR this afternoon.  ESRD - on HD TTS. Had HD here Tuesday. Next HD today.  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 Follow.  Secondary hyperparathyroidism - CCa in range. Cont renvela  as binder ac.  HD access - per [facility] AVF should be ready 4 wks from 04/13/23.        Myer Fret MD  [facility] 04/19/2023, 1:39 PM  Recent Labs  Lab 04/17/23 0018 04/17/23 0628 04/18/23 0243 04/19/23 0226  HGB  --    < > 10.3* 11.3*  ALBUMIN  2.6*  --  2.9*  --   CALCIUM  8.6*  --  8.8*  --   CREATININE 6.77*  --  3.90*  --   K 3.9  --  3.5  --    < > = values in this interval not displayed.   No results for input(s): IRON,  TIBC, FERRITIN in the last 168 hours. Inpatient medications:  albuterol   5 mg Nebulization Once   anastrozole   1 mg Oral Daily   arformoterol   15 mcg Nebulization BID   aspirin  EC  81 mg Oral Daily   atorvastatin   80 mg Oral q AM   budesonide  (PULMICORT ) nebulizer solution  0.5 mg Nebulization BID   Chlorhexidine  Gluconate Cloth  6 each Topical Q0600   Chlorhexidine  Gluconate Cloth  6 each Topical Q0600   Chlorhexidine  Gluconate Cloth  6 each Topical Q0600   docusate sodium   100 mg Oral BID   doxycycline   100 mg Oral Q12H   febuxostat   40 mg Oral q AM   fluticasone   1 spray Each Nare Daily   metoprolol  tartrate  37.5 mg Oral Q6H   midodrine   10 mg Oral Once in dialysis   mupirocin  ointment  1  Application Nasal BID   oseltamivir   30 mg Oral Q T,Th,Sat-1800   pantoprazole   40 mg Oral Daily   [START ON 04/20/2023] predniSONE   40 mg Oral Q breakfast   Followed by   [name] ON 04/25/2023] predniSONE   20 mg Oral Q breakfast   sevelamer  carbonate  800 mg Oral TID WC   traZODone   25 mg Oral QHS    amiodarone  30 mg/hr (04/19/23 1143)   heparin  1,000 Units/hr (04/19/23 1000)   piperacillin -tazobactam (ZOSYN )  IV 2.25 g (04/19/23 1225)   acetaminophen  **OR** acetaminophen , diclofenac  Sodium, ondansetron  **OR** ondansetron  (ZOFRAN ) IV

## 2023-04-19 NOTE — Progress Notes (Signed)
 Pharmacy Antibiotic Note  Rachel Villarreal is a 78 y.o. female admitted on 04/16/2023 with Flu and now possible aspiration pneumonia, WBC up.  Pharmacy has been consulted for piperacillin /tazobactam dosing. Asked to consider narrowing to ampicillin/sulbactam. Noted pt is on dialysis.  Plan: piperacillin nadine 2.25g q8hr EI Narrow abx as able and f/u duration    Height: 5' 2 (157.5 cm) Weight: 64.8 kg (142 lb 13.7 oz) IBW/kg (Calculated) : 50.1  Temp (24hrs), Avg:98.1 F (36.7 C), Min:97.9 F (36.6 C), Max:98.5 F (36.9 C)  Recent Labs  Lab 04/13/23 0821 04/16/23 1230 04/17/23 0018 04/17/23 0628 04/18/23 0243 04/19/23 0226  WBC  --  7.8  --  7.5 8.8 13.5*  CREATININE 4.10* 6.24* 6.77*  --  3.90*  --   LATICACIDVEN  --   --   --  1.0  --  1.0    Estimated Creatinine Clearance: 10.3 mL/min (A) (by C-G formula based on SCr of 3.9 mg/dL (H)).    Allergies  Allergen Reactions   Ace Inhibitors     angioedema   Lactose Intolerance (Gi) Other (See Comments)    GI upset   Letrozole  Hives   Lisinopril Swelling   Mercury     Red eyes, through eye drops that contained thimerosal    Tape Dermatitis    plastic    Antimicrobials this admission: tamiflu  1/6 - 1/10 Azith 1/7 > 1/8  CTX 1/7> 1/8 Piptazo 1/9>>     Microbiology results: 1/6 MRSA PCR: positive  1/6 Flu A positive  Thank you for allowing pharmacy to be a part of this patient's care.  Jinnie Door, PharmD, BCPS, BCCP Clinical Pharmacist  Please check AMION for all [facility] Pharmacy phone numbers After 10:00 PM, call Main Pharmacy [phone]

## 2023-04-19 NOTE — Progress Notes (Signed)
 PHARMACY - ANTICOAGULATION CONSULT NOTE  Pharmacy Consult for heparin  Indication: chest pain/ACS  Allergies  Allergen Reactions   Ace Inhibitors     angioedema   Lactose Intolerance (Gi) Other (See Comments)    GI upset   Letrozole  Hives   Lisinopril Swelling   Mercury     Red eyes, through eye drops that contained thimerosal    Tape Dermatitis    plastic    Patient Measurements: Height: 5' 2 (157.5 cm) Weight: 64.8 kg (142 lb 13.7 oz) IBW/kg (Calculated) : 50.1 Heparin  Dosing Weight: 63kg  Vital Signs: Temp: 97.9 F (36.6 C) (01/09 0721) Temp Source: Axillary (01/09 0721) BP: 119/87 (01/09 0721) Pulse Rate: 127 (01/09 0721)  Labs: Recent Labs    04/16/23 1230 04/16/23 1427 04/16/23 2243 04/17/23 0018 04/17/23 0628 04/18/23 0243 04/18/23 1020 04/19/23 0226  HGB 9.5*  --   --   --  9.5* 10.3*  --  11.3*  HCT 28.4*  --   --   --  28.4* 30.5*  --  33.4*  PLT 182  --   --   --  208 251  --  307  HEPARINUNFRC  --   --  0.34  --  0.54  --  0.20*  --   CREATININE 6.24*  --   --  6.77*  --  3.90*  --   --   TROPONINIHS 5,456* 4,458*  --   --   --   --   --   --     Estimated Creatinine Clearance: 10.3 mL/min (A) (by C-G formula based on SCr of 3.9 mg/dL (H)).   Assessment: 63 yof presented to the ED with SOB. Troponin elevated and with afib/RVR. Pharmacy consulted for heparin . Patient was switched to enoxaparin  treatment dose 1/8 and back to heparin  1/9.   Patient is in afib RVR again 1/9 am. Last enoxaparin  dose 1/8 at 1300. Last heparin  level 0.2 was subtherapeutic on 800 units/hr.  Goal of Therapy:  Heparin  level 0.3-0.7 units/ml Monitor platelets by anticoagulation protocol: Yes   Plan:  Stop enoxaparin  Restart heparin  gtt at 1000 units/hr, no bolus F/u 8hr heparin  level  Monitor daily heparin  level, CBC, signs/symptoms of bleeding  F/u cath plans   Jinnie Door, PharmD, BCPS, BCCP Clinical Pharmacist  Please check AMION for all [facility] Pharmacy phone  numbers After 10:00 PM, call Main Pharmacy [phone]

## 2023-04-19 NOTE — Progress Notes (Signed)
 Received a call from bedside RN regarding the patient being tachycardic and tachypneic, on 4 L nasal cannula, not on oxygen supplementation at baseline.  Currently being treated for influenza A with superimposed bacterial pneumonia and acute hypoxic respiratory failure.  Presented at bedside.  The patient is alert and orient x 4.  She is a retired proofreader.  Breathing is labored.  Bibasilar rales and diffuse expiratory wheezes noted on lungs auscultation.  The patient received total of 80 mg of IV Lasix , 20 mg of p.o. midodrine , 1 dose of IV Solu-Medrol  40 mg x 1, Xopenex  nebs and Atrovent  nebs.    The patient was switched to subcu Lovenox  per cardiology, currently fully anticoagulated.  Amiodarone  drip started for rate control.  Discussed with cardiology Dr. Floretta.  Will continue to closely monitor and treat as indicated.  Time: 15 minutes.

## 2023-04-19 NOTE — Progress Notes (Addendum)
 Patient Name: Rachel Villarreal Date of Encounter: 04/19/2023 [facility] Cardiologist: None   Interval Summary  .    Patient was seen overnight, around 2AM, by hospitalist due to patient becoming tachycardic and tachypneic. Patient reports feeling very short of breath last night. She admits that since receiving medication changes last night she is starting to feel better. She is still on 4L oxygen via [place] when I saw her this morning. Her SpO2 was reading at 96%, respirations were 31-35 with her HR in the 130s. She agrees with me that she would not likely tolerate a heart cath today and that it would be best to wait until she is more recovered from her acute respiratory illness.    Vital Signs .    Vitals:   04/19/23 0232 04/19/23 0355 04/19/23 0415 04/19/23 0721  BP: 128/75 (!) 148/92 132/75 119/87  Pulse: (!) 146 (!) 151 (!) 152 (!) 127  Resp: 18 (!) 32 (!) 28 (!) 27  Temp: 97.9 F (36.6 C)   97.9 F (36.6 C)  TempSrc: Oral   Axillary  SpO2: 91%   95%  Weight:      Height:        Intake/Output Summary (Last 24 hours) at 04/19/2023 9167 Last data filed at 04/18/2023 1302 Gross per 24 hour  Intake 240 ml  Output --  Net 240 ml      04/18/2023    4:37 AM 04/16/2023   10:36 PM 04/13/2023    7:32 AM  Last 3 Weights  Weight (lbs) 142 lb 13.7 oz 147 lb 7.8 oz 141 lb  Weight (kg) 64.8 kg 66.9 kg 63.957 kg      Telemetry/ECG    Resumed rapid atrial flutter from ~midnight onward, prior to that was NSR with brief burst of flutter earlier in evening - Personally Reviewed  Physical Exam .   GEN: No acute distress. Patient in bed on 4L oxygen via [place]  Neck: No JVD Cardiac: RRR, no murmurs. Respiratory: bibasilar crackles with wheezing  GI: Soft, nontender, non-distended  MS: No edema  Assessment & Plan .     Acute hypoxic respiratory failure Suspected multifactorial in setting of influenza A, multifocal PNA, acute HFreF Defer abx plan to IM, will msg Dr. Cherlyn ? Plan to  resume  NSTEMI, type II suspected Elevated troponins [phone]. Unclear if this represents demand ischemia versus underlying ACS. Patient had an overnight event where her breathing became worse. She was feeling better this morning from her event last night but still agrees that she would likely be unable to tolerate a heart cath today  Echo showed: LVEF 35-40%, global hypokinesis, mild concentric LVH, moderately enlarged RV, moderately reduced RV systolic function, mildly elevated pulmonary artery systolic pressure, severely dilated left atrium, moderately dilated right atrium, trivial pericardial effusion, mild MR, mild to moderate TR, mild PR, dilated IVC -- Once recovered from PNA would recommend LHC/RHC  -- For now continue medical management with heparin , ASA, statin  Acute on chronic HFrEF Hypertension Elevated BNP 3,699 Only two recorded weights in her chart (1/6, 1/8) where we went from 147.49 lb to 148.86 lb  It does not appear that patients I/Os are being properly documented  Most recent BP reading was 119/87 this morning  Was given hydralazine  25 mg TID and Lopressor  37.5 mg Q6 on 1/8 Lopressor  was held this morning due to dialysis scheduled today for patient  GDMT limited due to ESRD, also receiving midodrine   -- Reviewed update  in SOB overnight with care team, nephrology plans to prioritize HD -- Await tolerance to HD before continuing BB plan  Atrial flutter, paroxysmal, newly recognized this admission Patient telemetry shows patient going into and out of atrial flutter - back in flutter overnight HR this morning in the 130s -- Started heparin  per pharmacy dosing for anticoagulation, temporarily transitioned to Lovenox  overnight, back to IV heparin  today -- TSH normal -- Given paroxysmal nature of arrhythmia, do not anticipate electrical cardioversion, but will bolus with IV amio this AM per d/w MD (drip started overnight)  Hyperlipidemia Lipid panel showed: total 137,  HDL 36, LDL 79, trigs 108 Lipoprotein A 217.1 -- Continue Lipitor  80 mg daily - long term benefit not as convincing in ESRD but reasonable to continue with possible ACS  ESRD Patient scheduled to get dialysis today  -- Followed by nephrology   For questions or updates, please contact [facility] Please consult [email or web address] for contact info under        Signed, [place] Rachel Donath, PA-C   Personally seen and examined. Agree with APP above with the following comments: Patient notes she had issues with her meds, choked, hand has felt much worse. Back into AFL Hypotension requiring midodrine    Exam  GEN: Mild distress.  Neck: No JVD Cardiac: regular tachycardia, no murmurs. Respiratory: bibasilar crackles with wheezing and rhonchi on the right new from 1/8 Rest as above  Cancel Cath for now Agree with Abx escalation - IV amiodarone  bolus and drip - HD - no GDMT - HD planned today - discussed with TRH and Renal teams  Stanly Leavens, MD [facility] Cardiologist [facility]  [address], [address] [place], [place] [address] [phone]  10:13 AM

## 2023-04-19 NOTE — Plan of Care (Signed)
   Problem: Nutrition: Goal: Adequate nutrition will be maintained Outcome: Progressing   Problem: Coping: Goal: Level of anxiety will decrease Outcome: Progressing   Problem: Safety: Goal: Ability to remain free from injury will improve Outcome: Progressing

## 2023-04-19 NOTE — Evaluation (Signed)
 Clinical/Bedside Swallow Evaluation Patient Details  Name: Rachel Villarreal MRN: [id number] Date of Birth: 1945/04/15  Today's Date: 04/19/2023 Time: SLP Start Time (ACUTE ONLY): 1010 SLP Stop Time (ACUTE ONLY): 1022 SLP Time Calculation (min) (ACUTE ONLY): 12 min  Past Medical History:  Past Medical History:  Diagnosis Date   Abnormal Pap smear of vagina    Anemia    Asthma    Mild   Cancer (HCC)    left breast ILC   Chronic kidney disease (CKD)    stage 4   GERD (gastroesophageal reflux disease)    Gout 05/2012   Hernia, incisional    History of abnormal Pap smear    CIN III, 2001   History of blood transfusion    POST OP   History of uterine fibroid    Hypertension    Peritonitis (HCC)    Respiratory failure (HCC)    after ruptured appendix   Ruptured appendix    Seasonal allergies    Shingles 02/2013   very mild case   Urine incontinence    Past Surgical History:  Past Surgical History:  Procedure Laterality Date   A/V FISTULAGRAM Right 03/19/2023   Procedure: A/V Fistulagram;  Surgeon: Sheree Penne Bruckner, MD;  Location: [facility] INVASIVE CV LAB;  Service: Cardiovascular;  Laterality: Right;   APPENDECTOMY     AV FISTULA PLACEMENT Right 04/24/2022   Procedure: RIGHT BRACHIOCEPHALIC ARTERIOVENOUS (AV) FISTULA CREATION;  Surgeon: Eliza Bruckner RAMAN, MD;  Location: [facility] OR;  Service: Vascular;  Laterality: Right;   BREAST BIOPSY Right    fibrocystic changes   BREAST BIOPSY Left 08/02/2021   BREAST LUMPECTOMY WITH RADIOACTIVE SEED LOCALIZATION Left 09/12/2021   Procedure: LEFT BREAST LUMPECTOMY WITH RADIOACTIVE SEED LOCALIZATION;  Surgeon: Ebbie Cough, MD;  Location: [facility];  Service: General;  Laterality: Left;   CATARACT EXTRACTION Bilateral    COLONOSCOPY     COLONOSCOPY WITH PROPOFOL  N/A 02/18/2018   Procedure: COLONOSCOPY WITH PROPOFOL ;  Surgeon: Rosalie Kitchens, MD;  Location: [facility] ENDOSCOPY;  Service: Endoscopy;  Laterality: N/A;  Use  ultraslim scope    FISTULA SUPERFICIALIZATION Right 04/13/2023   Procedure: FISTULA SUPERFICIALIZATION;  Surgeon: Sheree Penne Bruckner, MD;  Location: [facility] OR;  Service: Vascular;  Laterality: Right;   IR FLUORO GUIDE CV LINE RIGHT  03/10/2023   LEEP  2000   CIN 2/3   RE-EXCISION OF BREAST LUMPECTOMY Left 09/29/2021   Procedure: LEFT  BREAST RE-EXCISION LUMPECTOMY;  Surgeon: Ebbie Cough, MD;  Location: [facility] ORS;  Service: General;  Laterality: Left;   REFRACTIVE SURGERY     SUPRACERVICAL ABDOMINAL HYSTERECTOMY  2001   HPI:  Rachel Villarreal is a 78 yo female presenting to ED 1/7 with SOB. CTA shows multifocal PNA in the setting of influenza A. Admission complicated by evidence of NSTEMI in addition to new onset A-fib with RVR. PMH includes HTN, ESRD on HD, breast cancer    Assessment / Plan / Recommendation  Clinical Impression  Pt endorses a history of esophageal reflux, which she manages with daily medication. No s/s of dysphagia or aspiration noted with trials of thin liquids or regular solids. Provided education regarding esophageal precautions. Recommend pt continue current diet without further SLP f/u. SLP Visit Diagnosis: Dysphagia, unspecified (R13.10)    Aspiration Risk  Mild aspiration risk    Diet Recommendation Regular;Thin liquid    Liquid Administration via: Cup;Straw Medication Administration: Whole meds with liquid Supervision: Patient able to self feed Compensations: Slow rate;Small sips/bites Postural  Changes: Seated upright at 90 degrees    Other  Recommendations Oral Care Recommendations: Oral care BID    Recommendations for follow up therapy are one component of a multi-disciplinary discharge planning process, led by the attending physician.  Recommendations may be updated based on patient status, additional functional criteria and insurance authorization.  Follow up Recommendations No SLP follow up      Assistance Recommended at Discharge    Functional  Status Assessment Patient has not had a recent decline in their functional status  Frequency and Duration            Prognosis Prognosis for improved oropharyngeal function: Good      Swallow Study   General HPI: Rachel Villarreal is a 78 yo female presenting to ED 1/7 with SOB. CTA shows multifocal PNA in the setting of influenza A. Admission complicated by evidence of NSTEMI in addition to new onset A-fib with RVR. PMH includes HTN, ESRD on HD, breast cancer Type of Study: Bedside Swallow Evaluation Previous Swallow Assessment: none in chart Diet Prior to this Study: Regular;Thin liquids (Level 0) Temperature Spikes Noted: No Respiratory Status: Nasal cannula History of Recent Intubation: No Behavior/Cognition: Alert;Cooperative;Pleasant mood Oral Cavity Assessment: Within Functional Limits Oral Care Completed by SLP: No Oral Cavity - Dentition: Adequate natural dentition Vision: Functional for self-feeding Self-Feeding Abilities: Able to feed self Patient Positioning: Upright in bed Baseline Vocal Quality: Normal Volitional Cough: Strong Volitional Swallow: Able to elicit    Oral/Motor/Sensory Function Overall Oral Motor/Sensory Function: Within functional limits   Ice Chips Ice chips: Not tested   Thin Liquid Thin Liquid: Within functional limits Presentation: Straw;Self Fed    Nectar Thick Nectar Thick Liquid: Not tested   Honey Thick Honey Thick Liquid: Not tested   Puree Puree: Not tested   Solid     Solid: Within functional limits Presentation: Self Fed      Damien Blumenthal, M.A., CF-SLP Speech Language Pathology, Acute Rehabilitation Services  Secure Chat preferred [phone]  04/19/2023,10:25 AM

## 2023-04-19 NOTE — Progress Notes (Signed)
 [facility]                                                                               Rachel Villarreal, is a 78 y.o. female, DOB - 06/13/1945, FMW:[id number] Admit date - 04/16/2023    Outpatient Primary MD for the patient is Husain, Karrar, MD  LOS - 3  days    Brief summary   Rachel Villarreal is a 78 y.o. female with medical history significant of HTN, ESRD on HD, breast CA admitted for SOB. She was found to have multifocal pneumonia, in the setting of influenza A infection. Hospitalization complicated by evidence of NSTEMI, In addition to atrial fibrillation with RVR.   Assessment & Plan    Assessment and Plan:     Non-ST elevated myocardial infarction (HCC) Continue with heparin  and aspirin .  Cardiology on board and recommending RHC till she is more stable.   Acute respiratory failure with hypoxia secondary to a combination of aspiration pneumonia, influenza A and fluid overload.  D/c ceftriaxone  and start her on IV zosyn  for possible aspiration event last night.  New leukocytosis but remains afebrile,  SLP evaluation.  Increased oxygen demands overnight , currently on 6 lit [place] oxygen , tachypnea and diffuse rhonchi on exam.  ABG shows hypoxia.    Acute on Chronic HFrEF Fluid management as per HD.  Pt reports having urine output with IV lasix  last night.  Cardiology on board.    Paroxysmal atrial fibrillation with RVR (HCC) Rate not well controlled.  Currently on amiodarone  gtt in addition to BB.  On IV heparin  for anticoagulation.   ESRD (end stage renal disease) (HCC) - on TTS.  - nephrology on board.    Essential hypertension Optimal BP parameters.  On hydralazine  25 mg TID.     Hyperlipidemia:  Continue with lipitor  80 mg daily.    Breast Cancer ER positive , PR positive.  Continue with anastrozole .    Anemia of chronic disease: Hemoglobin of 10.3 today.    Estimated body mass index is 26.13 kg/m as calculated from the  following:   Height as of this encounter: 5' 2 (1.575 m).   Weight as of this encounter: 64.8 kg.  Code Status: full code.  DVT Prophylaxis:  IV heparin .   Level of Care: Level of care: Telemetry Cardiac Family Communication: none at bedside.   Disposition Plan:     Remains inpatient appropriate:  pending clinical improvement.   Procedures:  None.   Consultants:   Cardiology.  nephrology  Antimicrobials:   Anti-infectives (From admission, onward)    Start     Dose/Rate Route Frequency Ordered Stop   04/19/23 1800  oseltamivir  (TAMIFLU ) capsule 30 mg        30 mg Oral Every T-Th-Sa (1800) 04/18/23 0947 04/21/23 1759   04/17/23 2300  oseltamivir  (TAMIFLU ) capsule 30 mg        30 mg Oral Every T-Th-Sa (1800) 04/17/23 2101 04/18/23 0213   04/17/23 2200  oseltamivir  (TAMIFLU ) capsule 30 mg  Status:  Discontinued        30 mg Oral Daily at bedtime 04/17/23 0858 04/17/23 2101   04/17/23 1300  cefTRIAXone  (  ROCEPHIN ) 2 g in sodium chloride  0.9 % 100 mL IVPB  Status:  Discontinued        2 g 200 mL/hr over 30 Minutes Intravenous Every 24 hours 04/17/23 1212 04/19/23 0914   04/17/23 1300  azithromycin  (ZITHROMAX ) tablet 500 mg  Status:  Discontinued        500 mg Oral Daily 04/17/23 1212 04/19/23 0914   04/17/23 1000  oseltamivir  (TAMIFLU ) capsule 30 mg  Status:  Discontinued        30 mg Oral Daily 04/16/23 2308 04/17/23 0858   04/16/23 1845  cefTRIAXone  (ROCEPHIN ) 1 g in sodium chloride  0.9 % 100 mL IVPB        1 g 200 mL/hr over 30 Minutes Intravenous  Once 04/16/23 1844 04/16/23 1930   04/16/23 1845  azithromycin  (ZITHROMAX ) 500 mg in sodium chloride  0.9 % 250 mL IVPB        500 mg 250 mL/hr over 60 Minutes Intravenous  Once 04/16/23 1844 04/16/23 2037   04/16/23 1545  oseltamivir  (TAMIFLU ) capsule 30 mg        30 mg Oral  Once 04/16/23 1543 04/16/23 1618        Medications  Scheduled Meds:  albuterol   5 mg Nebulization Once   amiodarone   150 mg Intravenous Once    anastrozole   1 mg Oral Daily   aspirin  EC  81 mg Oral Daily   atorvastatin   80 mg Oral q AM   Chlorhexidine  Gluconate Cloth  6 each Topical Q0600   Chlorhexidine  Gluconate Cloth  6 each Topical Q0600   Chlorhexidine  Gluconate Cloth  6 each Topical Q0600   docusate sodium   100 mg Oral BID   febuxostat   40 mg Oral q AM   fluticasone   1 spray Each Nare Daily   ipratropium  0.5 mg Nebulization STAT   levalbuterol   0.63 mg Nebulization STAT   metoprolol  tartrate  37.5 mg Oral Q6H   midodrine   10 mg Oral Once in dialysis   mupirocin  ointment  1 Application Nasal BID   oseltamivir   30 mg Oral Q T,Th,Sat-1800   pantoprazole   40 mg Oral Daily   sevelamer  carbonate  800 mg Oral TID WC   traZODone   25 mg Oral QHS   Continuous Infusions:  amiodarone  60 mg/hr (04/19/23 0527)   amiodarone      heparin      magnesium  sulfate bolus IVPB     PRN Meds:.acetaminophen  **OR** acetaminophen , diclofenac  Sodium, levalbuterol , ondansetron  **OR** ondansetron  (ZOFRAN ) IV    Subjective:   Rachel Villarreal was seen and examined today.  Dyspneic, lethargic.   Objective:   Vitals:   04/19/23 0232 04/19/23 0355 04/19/23 0415 04/19/23 0721  BP: 128/75 (!) 148/92 132/75 119/87  Pulse: (!) 146 (!) 151 (!) 152 (!) 127  Resp: 18 (!) 32 (!) 28 (!) 27  Temp: 97.9 F (36.6 C)   97.9 F (36.6 C)  TempSrc: Oral   Axillary  SpO2: 91%   95%  Weight:      Height:        Intake/Output Summary (Last 24 hours) at 04/19/2023 0945 Last data filed at 04/18/2023 1302 Gross per 24 hour  Intake 240 ml  Output --  Net 240 ml   Filed Weights   04/16/23 2236 04/18/23 0437  Weight: 66.9 kg 64.8 kg     Exam General exam: ill appearing lady in mod distress from sob.  Respiratory system: bilateral rhonchi, on 6lit of [place] oxygen.tachypnea  Cardiovascular system: S1 &  S2 heard, irregularly irregular, no pedal edema.  Gastrointestinal system: Abdomen is nondistended, soft and nontender. Central nervous system: lethargic,  but answering all questions.  Extremities: No edema.  Skin: No rashes,  Psychiatry: sleepy.      Data Reviewed:  I have personally reviewed following labs and imaging studies   CBC Lab Results  Component Value Date   WBC 13.5 (H) 04/19/2023   RBC 3.59 (L) 04/19/2023   HGB 11.3 (L) 04/19/2023   HCT 33.4 (L) 04/19/2023   MCV 93.0 04/19/2023   MCH 31.5 04/19/2023   PLT 307 04/19/2023   MCHC 33.8 04/19/2023   RDW 13.6 04/19/2023   LYMPHSABS 0.8 04/16/2023   MONOABS 0.8 04/16/2023   EOSABS 0.0 04/16/2023   BASOSABS 0.0 04/16/2023     Last metabolic panel Lab Results  Component Value Date   NA 136 04/18/2023   K 3.5 04/18/2023   CL 96 (L) 04/18/2023   CO2 26 04/18/2023   BUN 23 04/18/2023   CREATININE 3.90 (H) 04/18/2023   GLUCOSE 105 (H) 04/18/2023   GFRNONAA 11 (L) 04/18/2023   CALCIUM  8.8 (L) 04/18/2023   PHOS 9.1 (H) 03/10/2023   PROT 6.2 (L) 04/18/2023   ALBUMIN  2.9 (L) 04/18/2023   BILITOT 1.0 04/18/2023   ALKPHOS 81 04/18/2023   AST 33 04/18/2023   ALT 7 04/18/2023   ANIONGAP 14 04/18/2023    CBG (last 3)  No results for input(s): GLUCAP in the last 72 hours.    Coagulation Profile: No results for input(s): INR, PROTIME in the last 168 hours.   Radiology Studies: DG CHEST PORT 1 VIEW Result Date: 04/19/2023 CLINICAL DATA:  Shortness of breath EXAM: PORTABLE CHEST 1 VIEW COMPARISON:  04/16/2023 FINDINGS: Right dialysis catheter remains in place, unchanged. Cardiomegaly, vascular congestion. Small right pleural effusion and bibasilar opacities, likely atelectasis. No acute bony abnormality or overt edema. IMPRESSION: Cardiomegaly, vascular congestion. Small right effusion. Bibasilar atelectasis. Electronically Signed   By: Franky Crease M.D.   On: 04/19/2023 02:08   ECHOCARDIOGRAM COMPLETE Result Date: 04/17/2023    ECHOCARDIOGRAM REPORT   Patient Name:   Rachel Villarreal Date of Exam: 04/17/2023 Medical Rec #:  [id number]       Height:       62.0 in  Accession #:    [id number]      Weight:       147.5 lb Date of Birth:  Dec 14, 1945       BSA:          1.680 m Patient Age:    77 years        BP:           126/65 mmHg Patient Gender: F               HR:           96 bpm. Exam Location:  Inpatient Procedure: 2D Echo, Cardiac Doppler, Color Doppler and Intracardiac            Opacification Agent Indications:    NSTEMI  History:        Patient has no prior history of Echocardiogram examinations.                 Risk Factors:Hypertension and Former Smoker.  Sonographer:    Ozell Free Referring Phys: [id number] GEORGANNA ARCHER  Sonographer Comments: Technically challenging study due to limited acoustic windows. IMPRESSIONS  1. Left ventricular ejection fraction, by estimation, is 35 to 40%. The left ventricle  has moderately decreased function. The left ventricle demonstrates global hypokinesis. There is mild concentric left ventricular hypertrophy. Left ventricular diastolic parameters are indeterminate. There is the interventricular septum is flattened in systole and diastole, consistent with right ventricular pressure and volume overload.  2. Right ventricular systolic function is moderately reduced. The right ventricular size is moderately enlarged. There is mildly elevated pulmonary artery systolic pressure. The estimated right ventricular systolic pressure is 44.0 mmHg.  3. Left atrial size was severely dilated.  4. Right atrial size was moderately dilated.  5. The mitral valve is grossly normal. Mild mitral valve regurgitation. No evidence of mitral stenosis.  6. Tricuspid valve regurgitation is mild to moderate.  7. The aortic valve is tricuspid. Aortic valve regurgitation is not visualized. No aortic stenosis is present.  8. The inferior vena cava is dilated in size with >50% respiratory variability, suggesting right atrial pressure of 8 mmHg. FINDINGS  Left Ventricle: Left ventricular ejection fraction, by estimation, is 35 to 40%. The left ventricle has  moderately decreased function. The left ventricle demonstrates global hypokinesis. Definity  contrast agent was given IV to delineate the left ventricular endocardial borders. The left ventricular internal cavity size was normal in size. There is mild concentric left ventricular hypertrophy. The interventricular septum is flattened in systole and diastole, consistent with right ventricular pressure and volume overload. Left ventricular diastolic parameters are indeterminate. Right Ventricle: The right ventricular size is moderately enlarged. No increase in right ventricular wall thickness. Right ventricular systolic function is moderately reduced. There is mildly elevated pulmonary artery systolic pressure. The tricuspid regurgitant velocity is 3.00 m/s, and with an assumed right atrial pressure of 8 mmHg, the estimated right ventricular systolic pressure is 44.0 mmHg. Left Atrium: Left atrial size was severely dilated. Right Atrium: Right atrial size was moderately dilated. Pericardium: Trivial pericardial effusion is present. Mitral Valve: The mitral valve is grossly normal. Mild mitral valve regurgitation. No evidence of mitral valve stenosis. Tricuspid Valve: The tricuspid valve is grossly normal. Tricuspid valve regurgitation is mild to moderate. No evidence of tricuspid stenosis. Aortic Valve: The aortic valve is tricuspid. Aortic valve regurgitation is not visualized. No aortic stenosis is present. Aortic valve mean gradient measures 7.0 mmHg. Aortic valve peak gradient measures 15.5 mmHg. Aortic valve area, by VTI measures 1.73  cm. Pulmonic Valve: The pulmonic valve was grossly normal. Pulmonic valve regurgitation is mild. No evidence of pulmonic stenosis. Aorta: The aortic root and ascending aorta are structurally normal, with no evidence of dilitation. Venous: The inferior vena cava is dilated in size with greater than 50% respiratory variability, suggesting right atrial pressure of 8 mmHg. IAS/Shunts:  The atrial septum is grossly normal. Additional Comments: There is a small pleural effusion in the left lateral region.  LEFT VENTRICLE PLAX 2D LVIDd:         5.20 cm      Diastology LVIDs:         3.70 cm      LV e' medial:    6.85 cm/s LV PW:         1.30 cm      LV E/e' medial:  20.1 LV IVS:        1.20 cm      LV e' lateral:   10.00 cm/s LVOT diam:     2.00 cm      LV E/e' lateral: 13.8 LV SV:         66 LV SV Index:   39 LVOT Area:  3.14 cm  LV Volumes (MOD) LV vol d, MOD A2C: 102.0 ml LV vol d, MOD A4C: 86.1 ml LV vol s, MOD A2C: 40.7 ml LV vol s, MOD A4C: 45.4 ml LV SV MOD A2C:     61.3 ml LV SV MOD A4C:     86.1 ml LV SV MOD BP:      50.6 ml RIGHT VENTRICLE             IVC RV Basal diam:  4.70 cm     IVC diam: 2.70 cm RV S prime:     12.00 cm/s TAPSE (M-mode): 2.3 cm LEFT ATRIUM              Index        RIGHT ATRIUM           Index LA diam:        4.80 cm  2.86 cm/m   RA Area:     20.90 cm LA Vol (A2C):   111.0 ml 66.09 ml/m  RA Volume:   74.60 ml  44.42 ml/m LA Vol (A4C):   68.0 ml  40.49 ml/m LA Biplane Vol: 89.1 ml  53.05 ml/m  AORTIC VALVE AV Area (Vmax):    1.58 cm AV Area (Vmean):   1.62 cm AV Area (VTI):     1.73 cm AV Vmax:           197.00 cm/s AV Vmean:          124.000 cm/s AV VTI:            0.381 m AV Peak Grad:      15.5 mmHg AV Mean Grad:      7.0 mmHg LVOT Vmax:         98.90 cm/s LVOT Vmean:        63.800 cm/s LVOT VTI:          0.210 m LVOT/AV VTI ratio: 0.55  AORTA Ao Root diam: 2.90 cm Ao Asc diam:  3.50 cm MITRAL VALVE                TRICUSPID VALVE MV Area (PHT): 4.89 cm     TR Peak grad:   36.0 mmHg MV Decel Time: 155 msec     TR Vmax:        300.00 cm/s MV E velocity: 138.00 cm/s MV A velocity: 114.00 cm/s  SHUNTS MV E/A ratio:  1.21         Systemic VTI:  0.21 m                             Systemic Diam: 2.00 cm Darryle Decent MD Electronically signed by Darryle Decent MD Signature Date/Time: 04/17/2023/2:06:18 PM    Final        Elgie Butter M.D. [facility] 04/19/2023, 9:45 AM  Available via Epic secure chat 7am-7pm After 7 pm, please refer to night coverage provider listed on amion.

## 2023-04-19 NOTE — Progress Notes (Signed)
 PHARMACY - ANTICOAGULATION CONSULT NOTE  Pharmacy Consult for heparin  Indication: chest pain/ACS  Allergies  Allergen Reactions   Ace Inhibitors     angioedema   Lactose Intolerance (Gi) Other (See Comments)    GI upset   Letrozole  Hives   Lisinopril Swelling   Mercury     Red eyes, through eye drops that contained thimerosal    Tape Dermatitis    plastic    Patient Measurements: Height: 5' 2 (157.5 cm) Weight: 64.3 kg (141 lb 12.1 oz) IBW/kg (Calculated) : 50.1 Heparin  Dosing Weight: 63kg  Vital Signs: Temp: 98 F (36.7 C) (01/09 2005) Temp Source: Axillary (01/09 2005) BP: 134/61 (01/09 2005) Pulse Rate: 91 (01/09 2007)  Labs: Recent Labs    04/16/23 2243 04/17/23 0018 04/17/23 0628 04/18/23 0243 04/18/23 1020 04/19/23 0226 04/19/23 1309 04/19/23 2056  HGB   < >  --  9.5* 10.3*  --  11.3*  --   --   HCT  --   --  28.4* 30.5*  --  33.4*  --   --   PLT  --   --  208 251  --  307  --   --   HEPARINUNFRC  --   --  0.54  --  0.20*  --   --  0.25*  CREATININE  --  6.77*  --  3.90*  --   --  6.45*  --    < > = values in this interval not displayed.    Estimated Creatinine Clearance: 6.2 mL/min (A) (by C-G formula based on SCr of 6.45 mg/dL (H)).   Assessment: 48 yof presented to the ED with SOB. Troponin elevated and with afib/RVR. Pharmacy consulted for heparin . Patient was switched to enoxaparin  treatment dose 1/8 and back to heparin  1/9.   -heparin  level= 0.25 on 1000 units/hr  Goal of Therapy:  Heparin  level 0.3-0.7 units/ml Monitor platelets by anticoagulation protocol: Yes   Plan:  -Increase heparin  to 1150 units/hr -Heparin  level in 8 hours and daily wth CBC daily  Prentice Poisson, PharmD Clinical Pharmacist **Pharmacist phone directory can now be found on [email or web address] ([email or web address]).  Listed under [facility].

## 2023-04-19 NOTE — Progress Notes (Signed)
 Received patient in bed to unit.  Alert and oriented.  Informed consent signed and in chart.   TX duration:   Patient tolerated well.  Transported back to the room  Alert, without acute distress.  Hand-off given to patient's nurse Eva Idol  Access used: Right IJ Access issues: none  Total UF removed: 2L Medication(s) given: none Post HD VS: Bp 114/67 p 79 resp 21 02 Sat 96% temp 97.8 Post HD weight: 64.3kg  Rock [place], RN [facility]

## 2023-04-20 ENCOUNTER — Other Ambulatory Visit (HOSPITAL_COMMUNITY): Payer: Self-pay

## 2023-04-20 ENCOUNTER — Telehealth (HOSPITAL_COMMUNITY): Payer: Self-pay | Admitting: Pharmacy Technician

## 2023-04-20 ENCOUNTER — Inpatient Hospital Stay (HOSPITAL_COMMUNITY): Payer: Medicare Other

## 2023-04-20 DIAGNOSIS — I214 Non-ST elevation (NSTEMI) myocardial infarction: Secondary | ICD-10-CM | POA: Diagnosis not present

## 2023-04-20 DIAGNOSIS — N186 End stage renal disease: Secondary | ICD-10-CM | POA: Diagnosis not present

## 2023-04-20 DIAGNOSIS — I480 Paroxysmal atrial fibrillation: Secondary | ICD-10-CM | POA: Diagnosis not present

## 2023-04-20 DIAGNOSIS — J09X1 Influenza due to identified novel influenza A virus with pneumonia: Secondary | ICD-10-CM | POA: Diagnosis not present

## 2023-04-20 LAB — CBC
HCT: 33.6 % — ABNORMAL LOW (ref 36.0–46.0)
Hemoglobin: 11 g/dL — ABNORMAL LOW (ref 12.0–15.0)
MCH: 31.3 pg (ref 26.0–34.0)
MCHC: 32.7 g/dL (ref 30.0–36.0)
MCV: 95.5 fL (ref 80.0–100.0)
Platelets: 329 10*3/uL (ref 150–400)
RBC: 3.52 MIL/uL — ABNORMAL LOW (ref 3.87–5.11)
RDW: 13.7 % (ref 11.5–15.5)
WBC: 12 10*3/uL — ABNORMAL HIGH (ref 4.0–10.5)
nRBC: 0 % (ref 0.0–0.2)

## 2023-04-20 LAB — BASIC METABOLIC PANEL
Anion gap: 16 — ABNORMAL HIGH (ref 5–15)
BUN: 31 mg/dL — ABNORMAL HIGH (ref 8–23)
CO2: 27 mmol/L (ref 22–32)
Calcium: 9.6 mg/dL (ref 8.9–10.3)
Chloride: 92 mmol/L — ABNORMAL LOW (ref 98–111)
Creatinine, Ser: 4.18 mg/dL — ABNORMAL HIGH (ref 0.44–1.00)
GFR, Estimated: 10 mL/min — ABNORMAL LOW (ref >60)
Glucose, Bld: 113 mg/dL — ABNORMAL HIGH (ref 70–99)
Potassium: 4.5 mmol/L (ref 3.5–5.1)
Sodium: 135 mmol/L (ref 135–145)

## 2023-04-20 LAB — HEPARIN LEVEL (UNFRACTIONATED): Heparin Unfractionated: 0.32 [IU]/mL (ref 0.30–0.70)

## 2023-04-20 MED ORDER — SENNOSIDES-DOCUSATE SODIUM 8.6-50 MG PO TABS
2.0000 | ORAL_TABLET | Freq: Two times a day (BID) | ORAL | Status: DC
  Administered 2023-04-20 – 2023-04-23 (×5): 2 via ORAL
  Filled 2023-04-20 (×9): qty 2

## 2023-04-20 MED ORDER — BISACODYL 10 MG RE SUPP
10.0000 mg | Freq: Every day | RECTAL | Status: DC
  Administered 2023-04-20: 10 mg via RECTAL
  Filled 2023-04-20 (×3): qty 1

## 2023-04-20 MED ORDER — AMIODARONE HCL 200 MG PO TABS
400.0000 mg | ORAL_TABLET | Freq: Two times a day (BID) | ORAL | Status: DC
  Administered 2023-04-20 – 2023-04-25 (×11): 400 mg via ORAL
  Filled 2023-04-20 (×11): qty 2

## 2023-04-20 MED ORDER — AMIODARONE HCL 200 MG PO TABS: 200.0000 mg | ORAL_TABLET | Freq: Every day | ORAL | Status: DC

## 2023-04-20 MED ORDER — METOCLOPRAMIDE HCL 5 MG/ML IJ SOLN
5.0000 mg | Freq: Three times a day (TID) | INTRAMUSCULAR | Status: AC
  Administered 2023-04-20 – 2023-04-21 (×3): 5 mg via INTRAVENOUS
  Filled 2023-04-20 (×3): qty 2

## 2023-04-20 MED ORDER — POLYETHYLENE GLYCOL 3350 17 G PO PACK
17.0000 g | PACK | Freq: Every day | ORAL | Status: DC
  Administered 2023-04-20 – 2023-04-22 (×3): 17 g via ORAL
  Filled 2023-04-20 (×5): qty 1

## 2023-04-20 MED ORDER — METOPROLOL SUCCINATE ER 100 MG PO TB24
100.0000 mg | ORAL_TABLET | Freq: Every day | ORAL | Status: DC
  Administered 2023-04-21 – 2023-04-25 (×5): 100 mg via ORAL
  Filled 2023-04-20 (×5): qty 1

## 2023-04-20 MED ORDER — PANTOPRAZOLE SODIUM 40 MG PO TBEC
40.0000 mg | DELAYED_RELEASE_TABLET | Freq: Two times a day (BID) | ORAL | Status: DC
Start: 2023-04-20 — End: 2023-04-25
  Administered 2023-04-20 – 2023-04-25 (×10): 40 mg via ORAL
  Filled 2023-04-20 (×12): qty 1

## 2023-04-20 MED ORDER — CHLORHEXIDINE GLUCONATE CLOTH 2 % EX PADS
6.0000 | MEDICATED_PAD | Freq: Every day | CUTANEOUS | Status: DC
  Administered 2023-04-22 – 2023-04-25 (×3): 6 via TOPICAL

## 2023-04-20 NOTE — Progress Notes (Signed)
 [facility] Progress Note  Subjective: seen in room. Had IV amio started yesterday and returned to NSR, also had HD w/ 2 L off. Looks sig better today.   Vitals:   04/20/23 0833 04/20/23 0835 04/20/23 1116 04/20/23 1552  BP:   (!) 123/56 133/70  Pulse:  63 69 78  Resp:  18 18 18   Temp:   98.4 F (36.9 C) 98.7 F (37.1 C)  TempSrc:   Oral Oral  SpO2: 100% 100% 97% 91%  Weight:      Height:        Exam: Gen alert, no sig ^wob today No rash, cyanosis or gangrene Sclera anicteric, throat clear  No jvd or bruits Chest clear bilat  RRR no MRG Abd soft ntnd no mass or ascites +bs Ext no LE or UE edema, no wounds or ulcers Neuro is alert, Ox 3 , nf    R AVF +bruit, recent wound healing/ RIJ TDC    OP HD: [facility] 4h   400/1.5   2/2.5 bath  64 kg  Heparin  4000   Right TDC (revised RUE AVF)  - mircera 50 mcg q 2, last 1/02, due 1/16     Assessment/ Plan: Acute hypoxic resp failure - due to pna and poss vol excess. Not on home O2. Got UF 2 L w/ HD Tuesday and 2 L off Thursday. Looks much better today. Max UF w/ HD tomorrow Flu A/ multifocal pna - by CXR and CT chest. Per pmd Afib / RVR - affecting her condition. Started IV amio and is back in NSR this afternoon.  ESRD - on HD TTS. Had HD here Tuesday and Thursday. HD tomorrow.  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 Follow.  Secondary hyperparathyroidism - CCa in range. Cont renvela  as binder ac.  HD access - per VVS AVF should be ready 4 wks from 04/13/23.        Myer Fret MD  [facility] 04/20/2023, 4:56 PM  Recent Labs  Lab 04/17/23 0018 04/17/23 9371 04/18/23 0243 04/19/23 0226 04/19/23 1309 04/20/23 0926  HGB  --    < > 10.3* 11.3*  --  11.0*  ALBUMIN  2.6*  --  2.9*  --   --   --   CALCIUM  8.6*  --  8.8*  --  9.3 9.6  CREATININE 6.77*  --  3.90*  --  6.45* 4.18*  K 3.9  --  3.5  --  4.3 4.5   < > = values in this interval not  displayed.   No results for input(s): IRON, TIBC, FERRITIN in the last 168 hours. Inpatient medications:  albuterol   5 mg Nebulization Once   [START ON 04/30/2023] amiodarone   200 mg Oral Daily   amiodarone   400 mg Oral BID   anastrozole   1 mg Oral Daily   arformoterol   15 mcg Nebulization BID   aspirin  EC  81 mg Oral Daily   atorvastatin   80 mg Oral q AM   bisacodyl   10 mg Rectal Q0600   budesonide  (PULMICORT ) nebulizer solution  0.5 mg Nebulization BID   Chlorhexidine  Gluconate Cloth  6 each Topical Q0600   Chlorhexidine  Gluconate Cloth  6 each Topical Q0600   doxycycline   100 mg Oral Q12H   febuxostat   40 mg Oral q AM   fluticasone   1 spray Each Nare Daily   metoCLOPramide  (REGLAN ) injection  5 mg Intravenous Q8H   [START ON 04/21/2023] metoprolol  succinate  100 mg Oral Daily   metoprolol  tartrate  37.5 mg Oral Q6H   midodrine   10 mg Oral Once in dialysis   mupirocin  ointment  1 Application Nasal BID   pantoprazole   40 mg Oral BID   polyethylene glycol  17 g Oral Daily   predniSONE   40 mg Oral Q breakfast   Followed by   [name] ON 04/25/2023] predniSONE   20 mg Oral Q breakfast   senna-docusate  2 tablet Oral BID   sevelamer  carbonate  800 mg Oral TID WC   traZODone   25 mg Oral QHS    heparin  1,200 Units/hr (04/20/23 1134)   piperacillin -tazobactam (ZOSYN )  IV 2.25 g (04/20/23 1131)   acetaminophen  **OR** acetaminophen , diclofenac  Sodium, ondansetron  **OR** ondansetron  (ZOFRAN ) IV

## 2023-04-20 NOTE — Progress Notes (Signed)
Pt receives out-pt HD at [facility] on TTS. Will assist as needed.   Shemiah Rosch Renal Navigator [phone] 

## 2023-04-20 NOTE — Progress Notes (Addendum)
 PHARMACY - ANTICOAGULATION CONSULT NOTE  Pharmacy Consult for heparin  Indication: chest pain/ACS  Allergies  Allergen Reactions   Ace Inhibitors     angioedema   Lactose Intolerance (Gi) Other (See Comments)    GI upset   Letrozole  Hives   Lisinopril Swelling   Mercury     Red eyes, through eye drops that contained thimerosal    Tape Dermatitis    plastic    Patient Measurements: Height: 5' 2 (157.5 cm) Weight: 69 kg (152 lb 1.9 oz) IBW/kg (Calculated) : 50.1 Heparin  Dosing Weight: 63kg  Vital Signs: Temp: 98 F (36.7 C) (01/10 0815) Temp Source: Oral (01/10 0815) BP: 138/69 (01/10 0815) Pulse Rate: 63 (01/10 0835)  Labs: Recent Labs    04/18/23 0243 04/18/23 1020 04/19/23 0226 04/19/23 1309 04/19/23 2056 04/20/23 0925 04/20/23 0926  HGB 10.3*  --  11.3*  --   --   --  11.0*  HCT 30.5*  --  33.4*  --   --   --  33.6*  PLT 251  --  307  --   --   --  329  HEPARINUNFRC  --  0.20*  --   --  0.25* 0.32  --   CREATININE 3.90*  --   --  6.45*  --   --  4.18*    Estimated Creatinine Clearance: 9.9 mL/min (A) (by C-G formula based on SCr of 4.18 mg/dL (H)).   Assessment: 3 yof presented to the ED with SOB. Troponin elevated and with afib/RVR. Pharmacy consulted for heparin . Patient was switched to enoxaparin  treatment dose 1/8 and back to heparin  1/9.   Heparin  level 0.32 is at low end of therapeutic on 1150 units/hr.  CBC stable.  No issues with infusion or bleeding per RN.Will increase slightly to keep in goal.    Goal of Therapy:  Heparin  level 0.3-0.7 units/ml Monitor platelets by anticoagulation protocol: Yes   Plan:   Increase heparin  gtt 1200 units/hr   Monitor daily heparin  level, CBC, signs/symptoms of bleeding  F/u cath plans   Jinnie Door, PharmD, BCPS, [facility] Clinical Pharmacist  Please check AMION for all [facility] Pharmacy phone numbers After 10:00 PM, call Main Pharmacy [phone]

## 2023-04-20 NOTE — Telephone Encounter (Signed)
 Patient Product/process Development Scientist completed.    The patient is insured through [facility]. Patient has Medicare and is not eligible for a copay card, but may be able to apply for patient assistance or Medicare RX Payment Plan (Patient Must reach out to their plan, if eligible for payment plan), if available.    Ran test claim for Eliquis  5 mg and the current 30 day co-pay is $456.78 due to a deductible.   This test claim was processed through [facility]- copay amounts may vary at other pharmacies due to pharmacy/plan contracts, or as the patient moves through the different stages of their insurance plan.     Reyes Sharps, CPHT Pharmacy Technician III Certified Patient Advocate [facility] Pharm[facility] Patient Advocate Team Direct Number: [phone]  Fax: [phone]

## 2023-04-20 NOTE — Progress Notes (Signed)
 Pt vomited while eating her dinner. Pt mentioned that "I am not nauseous, it is just , whatever I swallow, It won't get down and it comes back up". PRN meds given. Some scheduled pills was taken. See Mar. Care ongoing.

## 2023-04-20 NOTE — Progress Notes (Signed)
 04/20/2023 Agree with note as written by Belia Heman and Minor: have added some reglan and suppositories as she is having trouble with PO and no BM in multiple days.  Also dropped o2 to 1LPM, wean to RA when able.  Myrla Halsted MD PCCM

## 2023-04-20 NOTE — Progress Notes (Signed)
 Progress Note  Patient Name: Rachel Villarreal Date of Encounter: 04/20/2023 Primary Cardiologist: None   Subjective   Overnight BP has improved.  Does not need standing midodrine . Patient notes that she is worried about aspiration.  She did not aspirate but did not eat breakfast this AM SOB has improved.  Vital Signs    Vitals:   04/20/23 0815 04/20/23 0828 04/20/23 0833 04/20/23 0835  BP: 138/69     Pulse: 72 66  63  Resp: 20 16  18   Temp: 98 F (36.7 C)     TempSrc: Oral     SpO2: 99% 100% 100% 100%  Weight:      Height:        Intake/Output Summary (Last 24 hours) at 04/20/2023 0853 Last data filed at 04/20/2023 0500 Gross per 24 hour  Intake 883.24 ml  Output 2000 ml  Net -1116.76 ml   Filed Weights   04/19/23 1452 04/19/23 1828 04/20/23 0427  Weight: 66.6 kg 64.3 kg 69 kg    Physical Exam   GEN: No acute distress.   Neck: No JVD Cardiac: RRR, no murmurs, rubs, or gallops.  Respiratory: Rhonchi bilaterally GI: Soft, nontender, non-distended  MS: No edema  On exam weaned to no O2, lying flat, with no tachypnea, sat 93%.  Labs   Telemetry: AF RVR-> SR   Chemistry Recent Labs  Lab 04/17/23 0018 04/18/23 0243 04/19/23 1309  NA 134* 136 135  K 3.9 3.5 4.3  CL 95* 96* 96*  CO2 19* 26 25  GLUCOSE 81 105* 185*  BUN 49* 23 46*  CREATININE 6.77* 3.90* 6.45*  CALCIUM  8.6* 8.8* 9.3  PROT 5.4* 6.2*  --   ALBUMIN  2.6* 2.9*  --   AST 40 33  --   ALT <5 7  --   ALKPHOS 71 81  --   BILITOT 1.6* 1.0  --   GFRNONAA 6* 11* 6*  ANIONGAP 20* 14 14     Hematology Recent Labs  Lab 04/17/23 0628 04/18/23 0243 04/19/23 0226  WBC 7.5 8.8 13.5*  RBC 2.97* 3.28* 3.59*  HGB 9.5* 10.3* 11.3*  HCT 28.4* 30.5* 33.4*  MCV 95.6 93.0 93.0  MCH 32.0 31.4 31.5  MCHC 33.5 33.8 33.8  RDW 13.8 13.5 13.6  PLT 208 251 307    Cardiac EnzymesNo results for input(s): TROPONINI in the last 168 hours. No results for input(s): TROPIPOC in the last 168 hours.    BNP Recent Labs  Lab 04/16/23 1230  BNP 3,699.7*     DDimer No results for input(s): DDIMER in the last 168 hours.   Cardiac Studies   Cardiac Studies & Procedures      ECHOCARDIOGRAM  ECHOCARDIOGRAM COMPLETE 04/17/2023  Narrative ECHOCARDIOGRAM REPORT    Patient Name:   KEMONI QUESENBERRY [place]n Date of Exam: 04/17/2023 Medical Rec #:  [id number]       Height:       62.0 in Accession #:    [id number]      Weight:       147.5 lb Date of Birth:  22-Sep-1945       BSA:          1.680 m Patient Age:    77 years        BP:           126/65 mmHg Patient Gender: F               HR:  96 bpm. Exam Location:  Inpatient  Procedure: 2D Echo, Cardiac Doppler, Color Doppler and Intracardiac Opacification Agent  Indications:    NSTEMI  History:        Patient has no prior history of Echocardiogram examinations. Risk Factors:Hypertension and Former Smoker.  Sonographer:    Ozell Free Referring Phys: [id number] GEORGANNA ARCHER   Sonographer Comments: Technically challenging study due to limited acoustic windows. IMPRESSIONS   1. Left ventricular ejection fraction, by estimation, is 35 to 40%. The left ventricle has moderately decreased function. The left ventricle demonstrates global hypokinesis. There is mild concentric left ventricular hypertrophy. Left ventricular diastolic parameters are indeterminate. There is the interventricular septum is flattened in systole and diastole, consistent with right ventricular pressure and volume overload. 2. Right ventricular systolic function is moderately reduced. The right ventricular size is moderately enlarged. There is mildly elevated pulmonary artery systolic pressure. The estimated right ventricular systolic pressure is 44.0 mmHg. 3. Left atrial size was severely dilated. 4. Right atrial size was moderately dilated. 5. The mitral valve is grossly normal. Mild mitral valve regurgitation. No evidence of mitral stenosis. 6. Tricuspid valve  regurgitation is mild to moderate. 7. The aortic valve is tricuspid. Aortic valve regurgitation is not visualized. No aortic stenosis is present. 8. The inferior vena cava is dilated in size with >50% respiratory variability, suggesting right atrial pressure of 8 mmHg.  FINDINGS Left Ventricle: Left ventricular ejection fraction, by estimation, is 35 to 40%. The left ventricle has moderately decreased function. The left ventricle demonstrates global hypokinesis. Definity  contrast agent was given IV to delineate the left ventricular endocardial borders. The left ventricular internal cavity size was normal in size. There is mild concentric left ventricular hypertrophy. The interventricular septum is flattened in systole and diastole, consistent with right ventricular pressure and volume overload. Left ventricular diastolic parameters are indeterminate.  Right Ventricle: The right ventricular size is moderately enlarged. No increase in right ventricular wall thickness. Right ventricular systolic function is moderately reduced. There is mildly elevated pulmonary artery systolic pressure. The tricuspid regurgitant velocity is 3.00 m/s, and with an assumed right atrial pressure of 8 mmHg, the estimated right ventricular systolic pressure is 44.0 mmHg.  Left Atrium: Left atrial size was severely dilated.  Right Atrium: Right atrial size was moderately dilated.  Pericardium: Trivial pericardial effusion is present.  Mitral Valve: The mitral valve is grossly normal. Mild mitral valve regurgitation. No evidence of mitral valve stenosis.  Tricuspid Valve: The tricuspid valve is grossly normal. Tricuspid valve regurgitation is mild to moderate. No evidence of tricuspid stenosis.  Aortic Valve: The aortic valve is tricuspid. Aortic valve regurgitation is not visualized. No aortic stenosis is present. Aortic valve mean gradient measures 7.0 mmHg. Aortic valve peak gradient measures 15.5 mmHg. Aortic valve  area, by VTI measures 1.73 cm.  Pulmonic Valve: The pulmonic valve was grossly normal. Pulmonic valve regurgitation is mild. No evidence of pulmonic stenosis.  Aorta: The aortic root and ascending aorta are structurally normal, with no evidence of dilitation.  Venous: The inferior vena cava is dilated in size with greater than 50% respiratory variability, suggesting right atrial pressure of 8 mmHg.  IAS/Shunts: The atrial septum is grossly normal.  Additional Comments: There is a small pleural effusion in the left lateral region.   LEFT VENTRICLE PLAX 2D LVIDd:         5.20 cm      Diastology LVIDs:         3.70 cm  LV e' medial:    6.85 cm/s LV PW:         1.30 cm      LV E/e' medial:  20.1 LV IVS:        1.20 cm      LV e' lateral:   10.00 cm/s LVOT diam:     2.00 cm      LV E/e' lateral: 13.8 LV SV:         66 LV SV Index:   39 LVOT Area:     3.14 cm  LV Volumes (MOD) LV vol d, MOD A2C: 102.0 ml LV vol d, MOD A4C: 86.1 ml LV vol s, MOD A2C: 40.7 ml LV vol s, MOD A4C: 45.4 ml LV SV MOD A2C:     61.3 ml LV SV MOD A4C:     86.1 ml LV SV MOD BP:      50.6 ml  RIGHT VENTRICLE             IVC RV Basal diam:  4.70 cm     IVC diam: 2.70 cm RV S prime:     12.00 cm/s TAPSE (M-mode): 2.3 cm  LEFT ATRIUM              Index        RIGHT ATRIUM           Index LA diam:        4.80 cm  2.86 cm/m   RA Area:     20.90 cm LA Vol (A2C):   111.0 ml 66.09 ml/m  RA Volume:   74.60 ml  44.42 ml/m LA Vol (A4C):   68.0 ml  40.49 ml/m LA Biplane Vol: 89.1 ml  53.05 ml/m AORTIC VALVE AV Area (Vmax):    1.58 cm AV Area (Vmean):   1.62 cm AV Area (VTI):     1.73 cm AV Vmax:           197.00 cm/s AV Vmean:          124.000 cm/s AV VTI:            0.381 m AV Peak Grad:      15.5 mmHg AV Mean Grad:      7.0 mmHg LVOT Vmax:         98.90 cm/s LVOT Vmean:        63.800 cm/s LVOT VTI:          0.210 m LVOT/AV VTI ratio: 0.55  AORTA Ao Root diam: 2.90 cm Ao Asc diam:  3.50  cm  MITRAL VALVE                TRICUSPID VALVE MV Area (PHT): 4.89 cm     TR Peak grad:   36.0 mmHg MV Decel Time: 155 msec     TR Vmax:        300.00 cm/s MV E velocity: 138.00 cm/s MV A velocity: 114.00 cm/s  SHUNTS MV E/A ratio:  1.21         Systemic VTI:  0.21 m Systemic Diam: 2.00 cm  Darryle Decent MD Electronically signed by Darryle Decent MD Signature Date/Time: 04/17/2023/2:06:18 PM    Final                  Assessment & Plan   Acute hypoxic respiratory failure - Suspected multifactorial in setting of influenza A, multifocal PNA - s/p HD, ABX as per primary  NSTEMI, type II suspected - She has  high risk for native CAD.  No clear WMA.  I offered her LHC/RHC today.  She declined for today but would be open for it in the future - continue ASA and statin - she is not planned for DC tomorrow.  If she declines LHC would transition her to DOAC and defer ischemic eval to outpatient  Acute on chronic HFrEF Hypertension - euvolemic; getting HD - will not start hydralazine  given low BP 04/19/23 - will consolidate to succinate tomorrow at lower dose  Atrial flutter, paroxysmal, newly recognized this admission - AC as above - transition to PO amiodarone  (400 mg PO BID 10 days then 200 mg daily- already ordered)  Hyperlipidemia -- Continue Lipitor  80 mg daily   ESRD -- Followed by nephrology    For questions or updates, please contact [facility] Please consult [email or web address] for contact info under Cardiology/STEMI.      Stanly Leavens, MD FASE [facility] Cardiologist [facility]  [address] [place], [address] [place], [place] [address] [phone]  8:53 AM

## 2023-04-20 NOTE — Consult Note (Addendum)
 NAME:  Rachel Villarreal, MRN:  [id number], DOB:  Dec 27, 1945, LOS: 4 ADMISSION DATE:  04/16/2023, CONSULTATION DATE:  04/18/22 REFERRING MD:  Cherlyn, CHIEF COMPLAINT:  SOB   History of Present Illness:  Seen for worsening resp failure.   No home O2, MMRC 0, hx asthma Worsening SOB found to have influenza, multifocal PNA, and Afib/RVR Yesterday was trying to swallow a big pill that got stuck then she had a big episode of emesis. Now having a little higher O2 needs so PCCM consulted. Denies prior aspiration issues. Labs/imaging/current meds reviewed  Pertinent  Medical History   Past Medical History:  Diagnosis Date   Abnormal Pap smear of vagina    Anemia    Asthma    Mild   Cancer (HCC)    left breast ILC   Chronic kidney disease (CKD)    stage 4   GERD (gastroesophageal reflux disease)    Gout 05/2012   Hernia, incisional    History of abnormal Pap smear    CIN III, 2001   History of blood transfusion    POST OP   History of uterine fibroid    Hypertension    Peritonitis (HCC)    Respiratory failure (HCC)    after ruptured appendix   Ruptured appendix    Seasonal allergies    Shingles 02/2013   very mild case   Urine incontinence      Significant Hospital Events: Including procedures, antibiotic start and stop dates in addition to other pertinent events   1/7 admit 1/9 pulm consult  Interim History / Subjective:  Pulm consult  Objective   Blood pressure 138/69, pulse 63, temperature 98 F (36.7 C), temperature source Oral, resp. rate 18, height 5' 2 (1.575 m), weight 69 kg, SpO2 100%.        Intake/Output Summary (Last 24 hours) at 04/20/2023 1117 Last data filed at 04/20/2023 0500 Gross per 24 hour  Intake 883.24 ml  Output 2000 ml  Net -1116.76 ml   Filed Weights   04/19/23 1452 04/19/23 1828 04/20/23 0427  Weight: 66.6 kg 64.3 kg 69 kg    Examination: Awake and alert reports breathing better But he is appreciated Heart sounds are  regular Diminished breath sounds in the bases Abdomen soft nontender Remedies with mild edema Noted to be -1100 cc for 24 hours  Patient Lines/Drains/Airways Status     Active Line/Drains/Airways     Name Placement date Placement time Site Days   Peripheral IV 04/16/23 22 G 1 Anterior;Left Forearm 04/16/23  1447  Forearm  3   Peripheral IV 04/19/23 20 G 1.88 Left;Anterior Forearm 04/19/23  0513  Forearm  less than 1   Fistula / Graft Left Upper arm Arteriovenous fistula 04/24/22  0817  Upper arm  360   Fistula / Graft Right Upper arm --  --  Upper arm  --   Hemodialysis Catheter Right Internal jugular Double lumen Permanent (Tunneled) 03/10/23  1104  Internal jugular  40   Incision (Closed) 04/17/23 Arm Anterior;Right;Upper 04/17/23  1347  -- 2            Resolved Hospital Problem list   N/A  Assessment & Plan:  Presumed CAP Asthma in flare Influenza A Afib/RVR 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  Wean O2 to off Continue dialysis as needed Continue current bronchodilators Prednisone   tapers in place Doxycycline  and Zosyn  for total 5 days Pulmonary toilet with incentive spirometer flutter and mobility  Atrial fibrillation and cardiomyopathy being evaluated per cardiology possible cardiac catheterization in the future  Pulmonary crit care available as needed  Best Practice (right click and Reselect all SmartList Selections daily)  Per primary  Labs   CBC: Recent Labs  Lab 04/16/23 1230 04/17/23 0628 04/18/23 0243 04/19/23 0226 04/20/23 0926  WBC 7.8 7.5 8.8 13.5* 12.0*  NEUTROABS 6.1  --   --   --   --   HGB 9.5* 9.5* 10.3* 11.3* 11.0*  HCT 28.4* 28.4* 30.5* 33.4* 33.6*  MCV 95.3 95.6 93.0 93.0 95.5  PLT 182 208 251 307 329    Basic Metabolic Panel: Recent Labs  Lab 04/16/23 1230 04/17/23 0018 04/18/23 0243  04/19/23 1309 04/20/23 0926  NA 138 134* 136 135 135  K 3.8 3.9 3.5 4.3 4.5  CL 97* 95* 96* 96* 92*  CO2 28 19* 26 25 27   GLUCOSE 124* 81 105* 185* 113*  BUN 48* 49* 23 46* 31*  CREATININE 6.24* 6.77* 3.90* 6.45* 4.18*  CALCIUM  9.1 8.6* 8.8* 9.3 9.6  MG  --  2.1  --  2.3  --    GFR: Estimated Creatinine Clearance: 9.9 mL/min (A) (by C-G formula based on SCr of 4.18 mg/dL (H)). Recent Labs  Lab 04/17/23 0018 04/17/23 0628 04/18/23 0243 04/19/23 0226 04/20/23 0926  PROCALCITON 3.18  --   --   --   --   WBC  --  7.5 8.8 13.5* 12.0*  LATICACIDVEN  --  1.0  --  1.0  --     Liver Function Tests: Recent Labs  Lab 04/17/23 0018 04/18/23 0243  AST 40 33  ALT <5 7  ALKPHOS 71 81  BILITOT 1.6* 1.0  PROT 5.4* 6.2*  ALBUMIN  2.6* 2.9*   No results for input(s): LIPASE, AMYLASE in the last 168 hours. No results for input(s): AMMONIA in the last 168 hours.  ABG    Component Value Date/Time   PHART 7.43 04/19/2023 0939   PCO2ART 37 04/19/2023 0939   PO2ART 53 (L) 04/19/2023 0939   HCO3 24.6 04/19/2023 0939   TCO2 26 04/13/2023 0821   O2SAT 89.1 04/19/2023 0939     Coagulation Profile: No results for input(s): INR, PROTIME in the last 168 hours.  Cardiac Enzymes: No results for input(s): CKTOTAL, CKMB, CKMBINDEX, TROPONINI in the last 168 hours.  HbA1C: Hgb A1c MFr Bld  Date/Time Value Ref Range Status  04/17/2023 06:28 AM 5.2 4.8 - 5.6 % Final    Comment:    (NOTE)         Prediabetes: 5.7 - 6.4         Diabetes: >6.4         Glycemic control for adults with diabetes: <7.0     CBG: No results for input(s): GLUCAP in the last 168 hours.     Marcey Minor ACNP Acute Care Nurse Practitioner [facility] Pul[facility]/Critical Care Please consult Amion 04/20/2023, 11:18 AM

## 2023-04-20 NOTE — Plan of Care (Signed)
  Problem: Education: Goal: Knowledge of General Education information will improve Description: Including pain rating scale, medication(s)/side effects and non-pharmacologic comfort measures Outcome: Progressing   Problem: Health Behavior/Discharge Planning: Goal: Ability to manage health-related needs will improve Outcome: Progressing   Problem: Clinical Measurements: Goal: Ability to maintain clinical measurements within normal limits will improve Outcome: Progressing Goal: Will remain free from infection Outcome: Progressing Goal: Diagnostic test results will improve Outcome: Progressing Goal: Respiratory complications will improve Outcome: Progressing Goal: Cardiovascular complication will be avoided Outcome: Progressing   Problem: Activity: Goal: Risk for activity intolerance will decrease Outcome: Progressing   Problem: Nutrition: Goal: Adequate nutrition will be maintained Outcome: Progressing   Problem: Coping: Goal: Level of anxiety will decrease Outcome: Progressing   Problem: Elimination: Goal: Will not experience complications related to bowel motility Outcome: Progressing Goal: Will not experience complications related to urinary retention Outcome: Progressing   Problem: Pain Management: Goal: General experience of comfort will improve Outcome: Progressing   Problem: Safety: Goal: Ability to remain free from injury will improve Outcome: Progressing   Problem: Skin Integrity: Goal: Risk for impaired skin integrity will decrease Outcome: Progressing   Problem: Education: Goal: Knowledge of disease or condition will improve Outcome: Progressing Goal: Understanding of medication regimen will improve Outcome: Progressing Goal: Individualized Educational Video(s) Outcome: Progressing   Problem: Cardiac: Goal: Ability to achieve and maintain adequate cardiopulmonary perfusion will improve Outcome: Progressing   Problem: Education: Goal:  Understanding of cardiac disease, CV risk reduction, and recovery process will improve Outcome: Progressing Goal: Individualized Educational Video(s) Outcome: Progressing   Problem: Activity: Goal: Ability to tolerate increased activity will improve Outcome: Progressing   Problem: Cardiac: Goal: Ability to achieve and maintain adequate cardiovascular perfusion will improve Outcome: Progressing   Problem: Health Behavior/Discharge Planning: Goal: Ability to safely manage health-related needs after discharge will improve Outcome: Progressing

## 2023-04-20 NOTE — Progress Notes (Signed)
 [facility]                                                                               Rachel Villarreal, is a 78 y.o. female, DOB - 11/18/1945, FMW:[id number] Admit date - 04/16/2023    Outpatient Primary MD for the patient is Husain, Karrar, MD  LOS - 4  days    Brief summary   Rachel Villarreal is a 78 y.o. female with medical history significant of HTN, ESRD on HD, breast CA admitted for SOB. She was found to have multifocal pneumonia, in the setting of influenza A infection. Hospitalization complicated by evidence of NSTEMI, In addition to atrial fibrillation with RVR.   Assessment & Plan    Assessment and Plan:     Non-ST elevated myocardial infarction [facility]) Currently chest pain free.  She requesting no cath today, but would be open to cath on Monday.  Cardiology on board.  Continue with IV heparin , aspirin  and statin.    Acute respiratory failure with hypoxia secondary to a combination of aspiration pneumonia, influenza A and fluid overload with a component of asthma exacerbation D/c ceftriaxone  and started her on IV zosyn  for possible aspiration event  on the night of 1/9 and doxycycline  .  [place] oxygen to keep sats greater than 90%. She required upto 6 lit/min , slowly weaning her oxygen down to 2 lit/min.  PCCM consulted.  New leukocytosis but remains afebrile, slowly improving.  SLP evaluation recommended regular diet with thin liquid, with compensations.  On steroid taper    Acute on Chronic HFrEF Fluid management with HD.    Paroxysmal atrial fibrillation with RVR (HCC) Rate  better controlled.  Currently on amiodarone  gtt in addition to BB. , transition to po amiodarone  today.  On IV heparin  for anticoagulation, transition to DOAC on discharge.   ESRD (end stage renal disease) (HCC)  TTS schedule - nephrology on board.    Essential hypertension  Well controlled.  On hydralazine  25 mg TID.     Hyperlipidemia:  Continue with lipitor  80 mg  daily.    Breast Cancer ER positive , PR positive.  Continue with anastrozole .    Anemia of chronic disease:  Hemoglobin is stable.    Constipation:  - added senna and miralax .    GERD With episodes of reflux, . Started on protonix  , increased to BID.     Estimated body mass index is 27.82 kg/m as calculated from the following:   Height as of this encounter: 5' 2 (1.575 m).   Weight as of this encounter: 69 kg.  Code Status: full code.  DVT Prophylaxis:  IV heparin .   Level of Care: Level of care: Telemetry Cardiac Family Communication: none at bedside.   Disposition Plan:     Remains inpatient appropriate:  pending clinical improvement.   Procedures:  None.   Consultants:   Cardiology.  Nephrology pccm  Antimicrobials:   Anti-infectives (From admission, onward)    Start     Dose/Rate Route Frequency Ordered Stop   04/19/23 1800  oseltamivir  (TAMIFLU ) capsule 30 mg        30 mg Oral Every T-Th-Sa (1800) 04/18/23 9052 04/20/23  9370   04/19/23 1130  doxycycline  (VIBRA -TABS) tablet 100 mg        100 mg Oral Every 12 hours 04/19/23 1043 04/24/23 0959   04/19/23 1030  piperacillin -tazobactam (ZOSYN ) IVPB 2.25 g        2.25 g 100 mL/hr over 30 Minutes Intravenous Every 8 hours 04/19/23 0949     04/17/23 2300  oseltamivir  (TAMIFLU ) capsule 30 mg        30 mg Oral Every T-Th-Sa (1800) 04/17/23 2101 04/18/23 0213   04/17/23 2200  oseltamivir  (TAMIFLU ) capsule 30 mg  Status:  Discontinued        30 mg Oral Daily at bedtime 04/17/23 0858 04/17/23 2101   04/17/23 1300  cefTRIAXone  (ROCEPHIN ) 2 g in sodium chloride  0.9 % 100 mL IVPB  Status:  Discontinued        2 g 200 mL/hr over 30 Minutes Intravenous Every 24 hours 04/17/23 1212 04/19/23 0914   04/17/23 1300  azithromycin  (ZITHROMAX ) tablet 500 mg  Status:  Discontinued        500 mg Oral Daily 04/17/23 1212 04/19/23 0914   04/17/23 1000  oseltamivir  (TAMIFLU ) capsule 30 mg  Status:  Discontinued        30 mg  Oral Daily 04/16/23 2308 04/17/23 0858   04/16/23 1845  cefTRIAXone  (ROCEPHIN ) 1 g in sodium chloride  0.9 % 100 mL IVPB        1 g 200 mL/hr over 30 Minutes Intravenous  Once 04/16/23 1844 04/16/23 1930   04/16/23 1845  azithromycin  (ZITHROMAX ) 500 mg in sodium chloride  0.9 % 250 mL IVPB        500 mg 250 mL/hr over 60 Minutes Intravenous  Once 04/16/23 1844 04/16/23 2037   04/16/23 1545  oseltamivir  (TAMIFLU ) capsule 30 mg        30 mg Oral  Once 04/16/23 1543 04/16/23 1618        Medications  Scheduled Meds:  albuterol   5 mg Nebulization Once   [START ON 04/30/2023] amiodarone   200 mg Oral Daily   amiodarone   400 mg Oral BID   anastrozole   1 mg Oral Daily   arformoterol   15 mcg Nebulization BID   aspirin  EC  81 mg Oral Daily   atorvastatin   80 mg Oral q AM   budesonide  (PULMICORT ) nebulizer solution  0.5 mg Nebulization BID   Chlorhexidine  Gluconate Cloth  6 each Topical Q0600   Chlorhexidine  Gluconate Cloth  6 each Topical Q0600   Chlorhexidine  Gluconate Cloth  6 each Topical Q0600   docusate sodium   100 mg Oral BID   doxycycline   100 mg Oral Q12H   febuxostat   40 mg Oral q AM   fluticasone   1 spray Each Nare Daily   [START ON 04/21/2023] metoprolol  succinate  100 mg Oral Daily   metoprolol  tartrate  37.5 mg Oral Q6H   midodrine   10 mg Oral Once in dialysis   mupirocin  ointment  1 Application Nasal BID   pantoprazole   40 mg Oral Daily   predniSONE   40 mg Oral Q breakfast   Followed by   [name] ON 04/25/2023] predniSONE   20 mg Oral Q breakfast   sevelamer  carbonate  800 mg Oral TID WC   traZODone   25 mg Oral QHS   Continuous Infusions:  heparin  1,150 Units/hr (04/19/23 2220)   piperacillin -tazobactam (ZOSYN )  IV 2.25 g (04/20/23 0422)   PRN Meds:.acetaminophen  **OR** acetaminophen , diclofenac  Sodium, ondansetron  **OR** ondansetron  (ZOFRAN ) IV    Subjective:   Rachel Villarreal was seen and examined today. No chest pain. But she reports breathing is the same.  Reports  one episode of vomiting last night.   Objective:   Vitals:   04/20/23 0815 04/20/23 0828 04/20/23 0833 04/20/23 0835  BP: 138/69     Pulse: 72 66  63  Resp: 20 16  18   Temp: 98 F (36.7 C)     TempSrc: Oral     SpO2: 99% 100% 100% 100%  Weight:      Height:        Intake/Output Summary (Last 24 hours) at 04/20/2023 0903 Last data filed at 04/20/2023 0500 Gross per 24 hour  Intake 883.24 ml  Output 2000 ml  Net -1116.76 ml   Filed Weights   04/19/23 1452 04/19/23 1828 04/20/23 0427  Weight: 66.6 kg 64.3 kg 69 kg     Exam General exam: Appears calm and comfortable  Respiratory system: diminished air entry , on 2 lit of [place] of oxygen.  Cardiovascular system: S1 & S2 heard, irregularly irregular.  No JVD,  Gastrointestinal system: Abdomen is nondistended, soft and nontender.  Central nervous system: Alert and oriented.  Extremities: Symmetric 5 x 5 power. Skin: No rashes,  Psychiatry: Mood & affect appropriate.       Data Reviewed:  I have personally reviewed following labs and imaging studies   CBC Lab Results  Component Value Date   WBC 13.5 (H) 04/19/2023   RBC 3.59 (L) 04/19/2023   HGB 11.3 (L) 04/19/2023   HCT 33.4 (L) 04/19/2023   MCV 93.0 04/19/2023   MCH 31.5 04/19/2023   PLT 307 04/19/2023   MCHC 33.8 04/19/2023   RDW 13.6 04/19/2023   LYMPHSABS 0.8 04/16/2023   MONOABS 0.8 04/16/2023   EOSABS 0.0 04/16/2023   BASOSABS 0.0 04/16/2023     Last metabolic panel Lab Results  Component Value Date   NA 135 04/19/2023   K 4.3 04/19/2023   CL 96 (L) 04/19/2023   CO2 25 04/19/2023   BUN 46 (H) 04/19/2023   CREATININE 6.45 (H) 04/19/2023   GLUCOSE 185 (H) 04/19/2023   GFRNONAA 6 (L) 04/19/2023   CALCIUM  9.3 04/19/2023   PHOS 9.1 (H) 03/10/2023   PROT 6.2 (L) 04/18/2023   ALBUMIN  2.9 (L) 04/18/2023   BILITOT 1.0 04/18/2023   ALKPHOS 81 04/18/2023   AST 33 04/18/2023   ALT 7 04/18/2023   ANIONGAP 14 04/19/2023    CBG (last 3)  No results  for input(s): GLUCAP in the last 72 hours.    Coagulation Profile: No results for input(s): INR, PROTIME in the last 168 hours.   Radiology Studies: DG CHEST PORT 1 VIEW Result Date: 04/19/2023 CLINICAL DATA:  Shortness of breath EXAM: PORTABLE CHEST 1 VIEW COMPARISON:  04/16/2023 FINDINGS: Right dialysis catheter remains in place, unchanged. Cardiomegaly, vascular congestion. Small right pleural effusion and bibasilar opacities, likely atelectasis. No acute bony abnormality or overt edema. IMPRESSION: Cardiomegaly, vascular congestion. Small right effusion. Bibasilar atelectasis. Electronically Signed   By: Franky Crease M.D.   On: 04/19/2023 02:08       Elgie Butter M.D. [facility] 04/20/2023, 9:03 AM  Available via Epic secure chat 7am-7pm After 7 pm, please refer to night coverage provider listed on amion.

## 2023-04-20 NOTE — Plan of Care (Signed)
 Patient improving, PCCM to sign off

## 2023-04-21 DIAGNOSIS — I214 Non-ST elevation (NSTEMI) myocardial infarction: Secondary | ICD-10-CM | POA: Diagnosis not present

## 2023-04-21 LAB — CBC
HCT: 29.2 % — ABNORMAL LOW (ref 36.0–46.0)
Hemoglobin: 9.6 g/dL — ABNORMAL LOW (ref 12.0–15.0)
MCH: 31.4 pg (ref 26.0–34.0)
MCHC: 32.9 g/dL (ref 30.0–36.0)
MCV: 95.4 fL (ref 80.0–100.0)
Platelets: 316 10*3/uL (ref 150–400)
RBC: 3.06 MIL/uL — ABNORMAL LOW (ref 3.87–5.11)
RDW: 13.6 % (ref 11.5–15.5)
WBC: 14.2 10*3/uL — ABNORMAL HIGH (ref 4.0–10.5)
nRBC: 0 % (ref 0.0–0.2)

## 2023-04-21 LAB — BASIC METABOLIC PANEL
Anion gap: 17 — ABNORMAL HIGH (ref 5–15)
BUN: 51 mg/dL — ABNORMAL HIGH (ref 8–23)
CO2: 20 mmol/L — ABNORMAL LOW (ref 22–32)
Calcium: 9 mg/dL (ref 8.9–10.3)
Chloride: 96 mmol/L — ABNORMAL LOW (ref 98–111)
Creatinine, Ser: 5.44 mg/dL — ABNORMAL HIGH (ref 0.44–1.00)
GFR, Estimated: 8 mL/min — ABNORMAL LOW (ref >60)
Glucose, Bld: 103 mg/dL — ABNORMAL HIGH (ref 70–99)
Potassium: 3.9 mmol/L (ref 3.5–5.1)
Sodium: 133 mmol/L — ABNORMAL LOW (ref 135–145)

## 2023-04-21 LAB — HEPARIN LEVEL (UNFRACTIONATED): Heparin Unfractionated: 0.44 [IU]/mL (ref 0.30–0.70)

## 2023-04-21 MED ORDER — HEPARIN SODIUM (PORCINE) 1000 UNIT/ML DIALYSIS
3000.0000 [IU] | Freq: Once | INTRAMUSCULAR | Status: DC
  Filled 2023-04-21: qty 3

## 2023-04-21 MED ORDER — NEPRO/CARBSTEADY PO LIQD: 237.0000 mL | ORAL | Status: DC | PRN

## 2023-04-21 MED ORDER — HEPARIN SODIUM (PORCINE) 1000 UNIT/ML DIALYSIS: 1000.0000 [IU] | INTRAMUSCULAR | Status: DC | PRN

## 2023-04-21 MED ORDER — HEPARIN SODIUM (PORCINE) 1000 UNIT/ML IJ SOLN
INTRAMUSCULAR | Status: AC
  Filled 2023-04-21: qty 4

## 2023-04-21 MED ORDER — ALTEPLASE 2 MG IJ SOLR: 2.0000 mg | Freq: Once | INTRAMUSCULAR | Status: DC | PRN

## 2023-04-21 MED ORDER — ANTICOAGULANT SODIUM CITRATE 4% (200MG/5ML) IV SOLN: 5.0000 mL | Status: DC | PRN

## 2023-04-21 MED ORDER — MIDODRINE HCL 5 MG PO TABS
10.0000 mg | ORAL_TABLET | Freq: Once | ORAL | Status: AC
  Administered 2023-04-21: 10 mg via ORAL
  Filled 2023-04-21: qty 2

## 2023-04-21 MED ORDER — HEPARIN SODIUM (PORCINE) 1000 UNIT/ML DIALYSIS
2000.0000 [IU] | INTRAMUSCULAR | Status: AC | PRN
  Administered 2023-04-21: 2000 [IU] via INTRAVENOUS_CENTRAL
  Filled 2023-04-21: qty 2

## 2023-04-21 NOTE — Evaluation (Signed)
 Physical Therapy Evaluation Patient Details Name: Rachel Villarreal MRN: [id number] DOB: Aug 30, 1945 Today's Date: 04/21/2023  History of Present Illness  Pt is 78 yo presenting to [facility] ED with shortness of breathe. Pt was found to have multifocal pneumonia with influenza A. Pt then had evidence of NSTEMI with a-fib and RVR. Cath planned for 04/22/22. PMH: HTN, ESRD on HD, breast cancer.  Clinical Impression  Pt is presenting well below her baseline level of functioning. Prior to hospitalization pt was ind with all functional mobility. Currently pt is CGA-Min A for bed mobility, Min-MoD A for sit to stand and Min A for very short non-functional distance gait with RW. Due to pt current functional status, home set up and available assistance at home recommending skilled physical therapy services < 3 hours/day in order to address strength, balance and functional mobility to decrease risk for falls, injury, immobility, skin break down and re-hospitalization.          If plan is discharge home, recommend the following: Assistance with cooking/housework;Assist for transportation;Help with stairs or ramp for entrance;A little help with walking and/or transfers   Can travel by private vehicle   No    Equipment Recommendations Rolling walker (2 wheels);BSC/3in1     Functional Status Assessment Patient has had a recent decline in their functional status and demonstrates the ability to make significant improvements in function in a reasonable and predictable amount of time.     Precautions / Restrictions Precautions Precautions: Fall Precaution Comments: droplet Restrictions Weight Bearing Restrictions Per Provider Order: No      Mobility  Bed Mobility Overal bed mobility: Needs Assistance Bed Mobility: Supine to Sit, Sit to Supine     Supine to sit: Contact guard Sit to supine: Min assist   General bed mobility comments: CGA for trunk with HOB up and MIn A for LE for sitting to supine due to  fatigue.    Transfers Overall transfer level: Needs assistance Equipment used: Rolling walker (2 wheels), None Transfers: Sit to/from Stand Sit to Stand: Min assist           General transfer comment: Min A with unsuccessful initial attempt without assistance due to heavy posterior lean with COM over BOS posteriorly. Pt attempted to use UE to maintain balance but was unable required physical intervention to prevent fall. Min A for safe standing with RW. Quick eccentric sits for all sitting from standing with out safe hand placement despite cueing.    Ambulation/Gait Ambulation/Gait assistance: Min assist, Mod assist Gait Distance (Feet): 4 Feet Assistive device: Rolling walker (2 wheels) Gait Pattern/deviations: Step-through pattern, Step-to pattern, Decreased step length - right, Decreased step length - left, Narrow base of support, Trunk flexed Gait velocity: decreased Gait velocity interpretation: <1.31 ft/sec, indicative of household ambulator   General Gait Details: trunk slightly flexed with COM posteriorly over BOS. Pt has very small step to to partial step through gait pattern and fatigued quickly. Pt took steps posteriorly to sit EOB with Mod A to prevent posterior fall and heavy multi modal cueing to keep AD close to pt. O2 sats down to 84% with mobility but uneven pleth line. In sitting pleth line continued to be uneven but reading at 93%      Balance Overall balance assessment: Needs assistance Sitting-balance support: Single extremity supported, Feet supported, Feet unsupported Sitting balance-Leahy Scale: Fair Sitting balance - Comments: no overt LOB Postural control: Posterior lean Standing balance support: Bilateral upper extremity supported, During functional activity, Reliant on  assistive device for balance Standing balance-Leahy Scale: Poor Standing balance comment: requires external support to remain standing.         Pertinent Vitals/Pain Pain  Assessment Pain Assessment: No/denies pain    Home Living Family/patient expects to be discharged to:: Private residence Living Arrangements: Alone;Other (Comment) (has a cat) Available Help at Discharge: Friend(s) (has a friend named Garment/textile Technologist  who can help intermittently but she lives in [place]) Type of Home: House Home Access: Stairs to enter Entrance Stairs-Rails: Left Entrance Stairs-Number of Steps: 3-4 Alternate Level Stairs-Number of Steps: a step to the den and a step to the kitchen. Home Layout: Two level;Able to live on main level with bedroom/bathroom Home Equipment: Shower seat - built in      Prior Function Prior Level of Function : Independent/Modified Independent;Driving             Mobility Comments: ambulated without an AD ADLs Comments: Ind with ADL's and IADL's.     Extremity/Trunk Assessment   Upper Extremity Assessment Upper Extremity Assessment: Generalized weakness    Lower Extremity Assessment Lower Extremity Assessment: Generalized weakness    Cervical / Trunk Assessment Cervical / Trunk Assessment: Normal  Communication   Communication Communication: No apparent difficulties Cueing Techniques: Verbal cues  Cognition Arousal: Alert Behavior During Therapy: WFL for tasks assessed/performed Overall Cognitive Status: Within Functional Limits for tasks assessed       General Comments: some safety deficits        General Comments General comments (skin integrity, edema, etc.): Pt has lymphedema of the LUE. Pt is aware and has garments and vasopneumatic pump but does not use equipment to manage.        Assessment/Plan    PT Assessment Patient needs continued PT services  PT Problem List Decreased strength;Decreased activity tolerance;Decreased mobility;Decreased balance       PT Treatment Interventions DME instruction;Stair training;Therapeutic activities;Balance training;Gait training;Functional mobility training;Therapeutic  exercise;Neuromuscular re-education;Patient/family education    PT Goals (Current goals can be found in the Care Plan section)  Acute Rehab PT Goals Patient Stated Goal: to get stronger and go home safely PT Goal Formulation: With patient Time For Goal Achievement: 05/05/23 Potential to Achieve Goals: Good    Frequency Min 1X/week        AM-PAC PT 6 Clicks Mobility  Outcome Measure Help needed turning from your back to your side while in a flat bed without using bedrails?: A Little Help needed moving from lying on your back to sitting on the side of a flat bed without using bedrails?: A Little Help needed moving to and from a bed to a chair (including a wheelchair)?: A Little Help needed standing up from a chair using your arms (e.g., wheelchair or bedside chair)?: A Lot Help needed to walk in hospital room?: A Lot Help needed climbing 3-5 steps with a railing? : A Lot 6 Click Score: 15    End of Session Equipment Utilized During Treatment: Gait belt Activity Tolerance: Patient tolerated treatment well;Patient limited by fatigue Patient left: in bed;with call bell/phone within reach;with bed alarm set Nurse Communication: Mobility status PT Visit Diagnosis: Unsteadiness on feet (R26.81);Other abnormalities of gait and mobility (R26.89);Muscle weakness (generalized) (M62.81)    Time: 8561-8497 PT Time Calculation (min) (ACUTE ONLY): 24 min   Charges:   PT Evaluation $PT Eval Low Complexity: 1 Low PT Treatments $Therapeutic Activity: 8-22 mins PT General Charges $$ ACUTE PT VISIT: 1 Visit       Dorothyann Maier, DPT, CLT  Acute Rehabilitation Services Office: [phone] (Secure chat preferred)   Dorothyann VEAR Maier 04/21/2023, 4:50 PM

## 2023-04-21 NOTE — Progress Notes (Signed)
 PHARMACY - ANTICOAGULATION CONSULT NOTE  Pharmacy Consult for heparin  Indication: chest pain/ACS  Allergies  Allergen Reactions   Ace Inhibitors     angioedema   Lactose Intolerance (Gi) Other (See Comments)    GI upset   Letrozole  Hives   Lisinopril Swelling   Mercury     Red eyes, through eye drops that contained thimerosal    Tape Dermatitis    plastic    Patient Measurements: Height: 5' 2 (157.5 cm) Weight: 69 kg (152 lb 1.9 oz) IBW/kg (Calculated) : 50.1 Heparin  Dosing Weight: 63kg  Vital Signs: Temp: 98.3 F (36.8 C) (01/11 0403) Temp Source: Oral (01/11 0403) BP: 122/46 (01/11 0403) Pulse Rate: 85 (01/11 0403)  Labs: Recent Labs    04/19/23 0226 04/19/23 1309 04/19/23 2056 04/20/23 0925 04/20/23 0926 04/21/23 0210  HGB 11.3*  --   --   --  11.0* 9.6*  HCT 33.4*  --   --   --  33.6* 29.2*  PLT 307  --   --   --  329 316  HEPARINUNFRC  --   --  0.25* 0.32  --  0.44  CREATININE  --  6.45*  --   --  4.18* 5.44*    Estimated Creatinine Clearance: 7.6 mL/min (A) (by C-G formula based on SCr of 5.44 mg/dL (H)).   Assessment: 45 yof presented to the ED with SOB. Troponin elevated and with afib/RVR. Pharmacy consulted for heparin . Patient was switched to enoxaparin  treatment dose 1/8 and back to heparin  1/9.   Heparin  level 0.44 is therapeutic on 1200 units/hr.  CBC stable.  No bleeding or bruising, no issues with infusion or bleeding per RN.   Goal of Therapy:  Heparin  level 0.3-0.7 units/ml Monitor platelets by anticoagulation protocol: Yes   Plan:   Continue heparin  gtt 1200 units/hr   Future heparin  levels not ordered with plans for either LHC today or transition to DOAC if the patient is not undergoing LHC F/u LHC plans   Signe Dawn, PharmD PGY2 Cardiology Pharmacy Resident  Please check AMION for all [facility] Pharmacy phone numbers After 10:00 PM, call Main Pharmacy [phone]

## 2023-04-21 NOTE — Procedures (Signed)
 HD Note:  Some information was entered later than the data was gathered due to patient care needs. The stated time with the data is accurate.  Received patient in bed to unit.   Alert and oriented.   Informed consent signed and in chart.   Access used: Right upper chest HD catheter Access issues: None  Patient tolerated treatment well.   TX duration: 3 hours  Alert, without acute distress.  Total UF removed: 3300 ml  Hand-off given to patient's nurse.   Transported back to the room   Janiyha Montufar L. Lenon, RN Kidney Dialysis Unit.

## 2023-04-21 NOTE — Progress Notes (Signed)
 Progress Note  Patient Name: Rachel Villarreal Date of Encounter: 04/21/2023  Primary Cardiologist:   None   Subjective   She has stilL some SOB.  No pain.     Inpatient Medications    Scheduled Meds:  albuterol   5 mg Nebulization Once   [START ON 04/30/2023] amiodarone   200 mg Oral Daily   amiodarone   400 mg Oral BID   anastrozole   1 mg Oral Daily   arformoterol   15 mcg Nebulization BID   aspirin  EC  81 mg Oral Daily   atorvastatin   80 mg Oral q AM   bisacodyl   10 mg Rectal Q0600   budesonide  (PULMICORT ) nebulizer solution  0.5 mg Nebulization BID   Chlorhexidine  Gluconate Cloth  6 each Topical Q0600   Chlorhexidine  Gluconate Cloth  6 each Topical Q0600   doxycycline   100 mg Oral Q12H   febuxostat   40 mg Oral q AM   fluticasone   1 spray Each Nare Daily   [START ON 04/22/2023] heparin   3,000 Units Dialysis Once in dialysis   metoprolol  succinate  100 mg Oral Daily   midodrine   10 mg Oral Once in dialysis   mupirocin  ointment  1 Application Nasal BID   pantoprazole   40 mg Oral BID   polyethylene glycol  17 g Oral Daily   predniSONE   40 mg Oral Q breakfast   Followed by   [name] ON 04/25/2023] predniSONE   20 mg Oral Q breakfast   senna-docusate  2 tablet Oral BID   sevelamer  carbonate  800 mg Oral TID WC   traZODone   25 mg Oral QHS   Continuous Infusions:  anticoagulant sodium citrate      heparin  1,200 Units/hr (04/20/23 1134)   piperacillin -tazobactam (ZOSYN )  IV 2.25 g (04/21/23 0314)   PRN Meds: acetaminophen  **OR** acetaminophen , alteplase , anticoagulant sodium citrate , diclofenac  Sodium, feeding supplement (NEPRO CARB STEADY), heparin , [START ON 04/22/2023] heparin , ondansetron  **OR** ondansetron  (ZOFRAN ) IV   Vital Signs    Vitals:   04/20/23 1116 04/20/23 1552 04/20/23 2345 04/21/23 0403  BP: (!) 123/56 133/70 (!) 124/57 (!) 122/46  Pulse: 69 78 78 85  Resp: 18 18 20 15   Temp: 98.4 F (36.9 C) 98.7 F (37.1 C) 98.2 F (36.8 C) 98.3 F (36.8 C)   TempSrc: Oral Oral Oral Oral  SpO2: 97% 91% 93% 94%  Weight:      Height:       No intake or output data in the 24 hours ending 04/21/23 0744 Filed Weights   04/19/23 1452 04/19/23 1828 04/20/23 0427  Weight: 66.6 kg 64.3 kg 69 kg    Telemetry    NSR - Personally Reviewed  ECG    NA - Personally Reviewed  Physical Exam   GEN: No acute distress.   Neck: No  JVD Cardiac: RRR, no murmurs, rubs, or gallops.  Respiratory:   Decreased breath sounds with diffuse wheezing and coarse crackles.  GI: Soft, nontender, non-distended  MS: No  edema; No deformity. Neuro:  Nonfocal  Psych: Normal affect   Labs    Chemistry Recent Labs  Lab 04/17/23 0018 04/18/23 0243 04/19/23 1309 04/20/23 0926 04/21/23 0210  NA 134* 136 135 135 133*  K 3.9 3.5 4.3 4.5 3.9  CL 95* 96* 96* 92* 96*  CO2 19* 26 25 27  20*  GLUCOSE 81 105* 185* 113* 103*  BUN 49* 23 46* 31* 51*  CREATININE 6.77* 3.90* 6.45* 4.18* 5.44*  CALCIUM  8.6* 8.8* 9.3 9.6 9.0  PROT  5.4* 6.2*  --   --   --   ALBUMIN  2.6* 2.9*  --   --   --   AST 40 33  --   --   --   ALT <5 7  --   --   --   ALKPHOS 71 81  --   --   --   BILITOT 1.6* 1.0  --   --   --   GFRNONAA 6* 11* 6* 10* 8*  ANIONGAP 20* 14 14 16* 17*     Hematology Recent Labs  Lab 04/19/23 0226 04/20/23 0926 04/21/23 0210  WBC 13.5* 12.0* 14.2*  RBC 3.59* 3.52* 3.06*  HGB 11.3* 11.0* 9.6*  HCT 33.4* 33.6* 29.2*  MCV 93.0 95.5 95.4  MCH 31.5 31.3 31.4  MCHC 33.8 32.7 32.9  RDW 13.6 13.7 13.6  PLT 307 329 316    Cardiac EnzymesNo results for input(s): TROPONINI in the last 168 hours. No results for input(s): TROPIPOC in the last 168 hours.   BNP Recent Labs  Lab 04/16/23 1230  BNP 3,699.7*     DDimer No results for input(s): DDIMER in the last 168 hours.   Radiology    DG Abd 1 View Result Date: 04/20/2023 CLINICAL DATA:  78 year old female with nausea and vomiting. History of breast cancer. EXAM: ABDOMEN - 1 VIEW COMPARISON:   Restaging CT Abdomen and Pelvis 11/23/2022. FINDINGS: AP view at 1049 hours. Chronic pelvic surgical clips. Chronic distal large bowel diverticulosis. Non obstructed bowel gas pattern. Stable abdominal visceral contours. No acute or suspicious osseous abnormality identified. IMPRESSION: Non obstructed bowel gas pattern. Chronic distal large bowel diverticulosis. Electronically Signed   By: VEAR Hurst M.D.   On: 04/20/2023 12:07    Cardiac Studies   Echo:   1. Left ventricular ejection fraction, by estimation, is 35 to 40%. The  left ventricle has moderately decreased function. The left ventricle  demonstrates global hypokinesis. There is mild concentric left ventricular  hypertrophy. Left ventricular  diastolic parameters are indeterminate. There is the interventricular  septum is flattened in systole and diastole, consistent with right  ventricular pressure and volume overload.   2. Right ventricular systolic function is moderately reduced. The right  ventricular size is moderately enlarged. There is mildly elevated  pulmonary artery systolic pressure. The estimated right ventricular  systolic pressure is 44.0 mmHg.   3. Left atrial size was severely dilated.   4. Right atrial size was moderately dilated.   5. The mitral valve is grossly normal. Mild mitral valve regurgitation.  No evidence of mitral stenosis.   6. Tricuspid valve regurgitation is mild to moderate.   7. The aortic valve is tricuspid. Aortic valve regurgitation is not  visualized. No aortic stenosis is present.   8. The inferior vena cava is dilated in size with >50% respiratory  variability, suggesting right atrial pressure of 8 mmHg.    Patient Profile     78 y.o. female with a hx of ESRD on IHD, HTN, HLD, prior breast CA, gout, and GERD who is being seen 04/16/2023 for the evaluation of elevated troponins at the request of Dr. Jerrol.   Assessment & Plan    Acute hypoxic respiratory failure:   Multifactorial in the  setting of pneumonia, flu.  Continue supportive care per primary team.       Atrial fib with RVR:   Rate is controlled.  No plan for invasive evaluation.  Will change to DOAC after cath.  Continue current  meds.    NSTEMI:  She consents to cath before discharge.  Will plan for Monday.   The patient understands that risks included but are not limited to stroke (1 in 1000), death (1 in 1000), kidney failure [usually temporary] (1 in 500), bleeding (1 in 200), allergic reaction [possibly serious] (1 in 200).  The patient understands and agrees to proceed.   HTN:    This has been low.  Unable to titrate meds with intermittent hypotension with dialysis.  She did tolerate two doses of metoprolol  yesterday.  She is ordered to get Toprol  XL 100 mg today and we can see how she tolerates this  ESRD: Per nephrology  Acute systolic HF:    Volume per dialysis.       For questions or updates, please contact [facility] Please consult [email or web address] for contact info under Cardiology/STEMI.   Signed, Lynwood Schilling, MD  04/21/2023, 7:44 AM

## 2023-04-21 NOTE — Plan of Care (Signed)

## 2023-04-21 NOTE — Progress Notes (Signed)
 PROGRESS NOTE    Rachel Villarreal  FMW:[id number] DOB: [birth year] DOA: 04/16/2023 PCP: Ransom Other, MD   Brief Narrative:  This 78 y.o. female with medical history significant of HTN, ESRD on HD, breast CA admitted for SOB. She was found to have multifocal pneumonia, in the setting of influenza A infection. Hospitalization complicated by evidence of NSTEMI, In addition to atrial fibrillation with RVR.  Cardiology and Nephrology was consulted  Assessment & Plan:   Principal Problem:   Non-ST elevated myocardial infarction [facility]) Active Problems:   Influenza A with pneumonia   Paroxysmal atrial fibrillation with RVR (HCC)   ESRD (end stage renal disease) (HCC)   Essential hypertension   NSTEMI: Currently chest pain free.  Cardiology on board, recommended left heart cath.  Patient is agreeable to cath on Monday Continue with IV heparin , aspirin  and statin.      Acute hypoxic respiratory failure, multifactorial: Likely due  to a combination of aspiration pneumonia, influenza A and fluid overload with a component of asthma exacerbation D/c ceftriaxone  and started on IV zosyn  for possible aspiration event on the night of 1/9 and doxycycline  .  Continue supplemental oxygen to keep saturation above 90% .She required upto 6 lit/min , slowly weaning her oxygen down to 2 lit/min.  PCCM consulted.  New leukocytosis but remains afebrile, slowly improving.  SLP evaluation recommended regular diet with thin liquid, with compensations.  On steroid taper    Acute on Chronic HFrEF Fluid management with HD.    Paroxysmal atrial fibrillation with RVR (HCC) Heart rate better controlled.  Currently on amiodarone  gtt in addition to BB. , transition to po amiodarone  today.  On IV heparin  for anticoagulation, transition to DOAC on discharge.    ESRD (end stage renal disease) (HCC)  TTS schedule Nephrology on board.     Essential hypertension Well controlled.  Continue hydralazine  25 mg TID.     Hyperlipidemia:  Continue with lipitor  80 mg daily.    Breast Cancer ER positive , PR positive.  Continue with anastrozole .    Anemia of chronic disease: Hemoglobin is stable.    Constipation:  Continue Senokot and MiraLAX    GERD Continue Protonix  40 twice daily       Estimated body mass index is 27.82 kg/m as calculated from the following:   Height as of this encounter: 5' 2 (1.575 m).   Weight as of this encounter: 69 kg.    DVT prophylaxis: Heparin  IV Code Status:Full code Family Communication: No family at bed side. Disposition Plan:   Status is: Inpatient Remains inpatient appropriate because: Admitted for  NSTEMI,    Consultants:  Cardiology Nephrology  Procedures: None  Antimicrobials:  Anti-infectives (From admission, onward)    Start     Dose/Rate Route Frequency Ordered Stop   04/19/23 1800  oseltamivir  (TAMIFLU ) capsule 30 mg        30 mg Oral Every T-Th-Sa (1800) 04/18/23 0947 04/20/23 0629   04/19/23 1130  doxycycline  (VIBRA -TABS) tablet 100 mg        100 mg Oral Every 12 hours 04/19/23 1043 04/24/23 0959   04/19/23 1030  piperacillin -tazobactam (ZOSYN ) IVPB 2.25 g        2.25 g 100 mL/hr over 30 Minutes Intravenous Every 8 hours 04/19/23 0949     04/17/23 2300  oseltamivir  (TAMIFLU ) capsule 30 mg        30 mg Oral Every T-Th-Sa (1800) 04/17/23 2101 04/18/23 0213   04/17/23 2200  oseltamivir  (TAMIFLU ) capsule 30 mg  Status:  Discontinued        30 mg Oral Daily at bedtime 04/17/23 0858 04/17/23 2101   04/17/23 1300  cefTRIAXone  (ROCEPHIN ) 2 g in sodium chloride  0.9 % 100 mL IVPB  Status:  Discontinued        2 g 200 mL/hr over 30 Minutes Intravenous Every 24 hours 04/17/23 1212 04/19/23 0914   04/17/23 1300  azithromycin  (ZITHROMAX ) tablet 500 mg  Status:  Discontinued        500 mg Oral Daily 04/17/23 1212 04/19/23 0914   04/17/23 1000  oseltamivir  (TAMIFLU ) capsule 30 mg  Status:  Discontinued        30 mg Oral Daily 04/16/23 2308  04/17/23 0858   04/16/23 1845  cefTRIAXone  (ROCEPHIN ) 1 g in sodium chloride  0.9 % 100 mL IVPB        1 g 200 mL/hr over 30 Minutes Intravenous  Once 04/16/23 1844 04/16/23 1930   04/16/23 1845  azithromycin  (ZITHROMAX ) 500 mg in sodium chloride  0.9 % 250 mL IVPB        500 mg 250 mL/hr over 60 Minutes Intravenous  Once 04/16/23 1844 04/16/23 2037   04/16/23 1545  oseltamivir  (TAMIFLU ) capsule 30 mg        30 mg Oral  Once 04/16/23 1543 04/16/23 1618      Subjective: Patient was seen and examined at bedside.  Overnight events noted.   Patient reports doing much better.  Denies any chest pain.  Objective: Vitals:   04/21/23 1130 04/21/23 1200 04/21/23 1220 04/21/23 1230  BP: (!) 110/45 (!) 113/95 123/64 (!) 110/52  Pulse: 65 69 (!) 52 72  Resp: 14 20 20 18   Temp:    97.9 F (36.6 C)  TempSrc:      SpO2: 98% 96% 99% 100%  Weight:    65.7 kg  Height:        Intake/Output Summary (Last 24 hours) at 04/21/2023 1414 Last data filed at 04/21/2023 1230 Gross per 24 hour  Intake --  Output 3300 ml  Net -3300 ml   Filed Weights   04/20/23 0427 04/21/23 0817 04/21/23 1230  Weight: 69 kg 69 kg 65.7 kg    Examination:  General exam: Appears calm and comfortable, deconditioned, not in any acute distress. Respiratory system: Clear to auscultation. Respiratory effort normal.  RR 15 Cardiovascular system: S1 & S2 heard, RRR. No JVD, murmurs, rubs, gallops or clicks.  Gastrointestinal system: Abdomen is non distended, soft and non tender. Normal bowel sounds heard. Central nervous system: Alert and oriented x 3. No focal neurological deficits. Extremities: Symmetric 5 x 5 power. Skin: No rashes, lesions or ulcers Psychiatry: Judgement and insight appear normal. Mood & affect appropriate.     Data Reviewed: I have personally reviewed following labs and imaging studies  CBC: Recent Labs  Lab 04/16/23 1230 04/17/23 0628 04/18/23 0243 04/19/23 0226 04/20/23 0926 04/21/23 0210   WBC 7.8 7.5 8.8 13.5* 12.0* 14.2*  NEUTROABS 6.1  --   --   --   --   --   HGB 9.5* 9.5* 10.3* 11.3* 11.0* 9.6*  HCT 28.4* 28.4* 30.5* 33.4* 33.6* 29.2*  MCV 95.3 95.6 93.0 93.0 95.5 95.4  PLT 182 208 251 307 329 316   Basic Metabolic Panel: Recent Labs  Lab 04/17/23 0018 04/18/23 0243 04/19/23 1309 04/20/23 0926 04/21/23 0210  NA 134* 136 135 135 133*  K 3.9 3.5 4.3 4.5 3.9  CL 95* 96* 96* 92* 96*  CO2 19*  26 25 27  20*  GLUCOSE 81 105* 185* 113* 103*  BUN 49* 23 46* 31* 51*  CREATININE 6.77* 3.90* 6.45* 4.18* 5.44*  CALCIUM  8.6* 8.8* 9.3 9.6 9.0  MG 2.1  --  2.3  --   --    GFR: Estimated Creatinine Clearance: 7.5 mL/min (A) (by C-G formula based on SCr of 5.44 mg/dL (H)). Liver Function Tests: Recent Labs  Lab 04/17/23 0018 04/18/23 0243  AST 40 33  ALT <5 7  ALKPHOS 71 81  BILITOT 1.6* 1.0  PROT 5.4* 6.2*  ALBUMIN  2.6* 2.9*   No results for input(s): LIPASE, AMYLASE in the last 168 hours. No results for input(s): AMMONIA in the last 168 hours. Coagulation Profile: No results for input(s): INR, PROTIME in the last 168 hours. Cardiac Enzymes: No results for input(s): CKTOTAL, CKMB, CKMBINDEX, TROPONINI in the last 168 hours. BNP (last 3 results) No results for input(s): PROBNP in the last 8760 hours. HbA1C: No results for input(s): HGBA1C in the last 72 hours. CBG: No results for input(s): GLUCAP in the last 168 hours. Lipid Profile: No results for input(s): CHOL, HDL, LDLCALC, TRIG, CHOLHDL, LDLDIRECT in the last 72 hours. Thyroid  Function Tests: No results for input(s): TSH, T4TOTAL, FREET4, T3FREE, THYROIDAB in the last 72 hours. Anemia Panel: No results for input(s): VITAMINB12, FOLATE, FERRITIN, TIBC, IRON, RETICCTPCT in the last 72 hours. Sepsis Labs: Recent Labs  Lab 04/17/23 0018 04/17/23 0628 04/19/23 0226  PROCALCITON 3.18  --   --   LATICACIDVEN  --  1.0 1.0    Recent Results  (from the past 240 hours)  Resp panel by RT-PCR (RSV, Flu A&B, Covid) Anterior Nasal Swab     Status: Abnormal   Collection Time: 04/16/23 12:30 PM   Specimen: Anterior Nasal Swab  Result Value Ref Range Status   SARS Coronavirus 2 by RT PCR NEGATIVE NEGATIVE Final    Comment: (NOTE) SARS-CoV-2 target nucleic acids are NOT DETECTED.  The SARS-CoV-2 RNA is generally detectable in upper respiratory specimens during the acute phase of infection. The lowest concentration of SARS-CoV-2 viral copies this assay can detect is 138 copies/mL. A negative result does not preclude SARS-Cov-2 infection and should not be used as the sole basis for treatment or other patient management decisions. A negative result may occur with  improper specimen collection/handling, submission of specimen other than nasopharyngeal swab, presence of viral mutation(s) within the areas targeted by this assay, and inadequate number of viral copies(<138 copies/mL). A negative result must be combined with clinical observations, patient history, and epidemiological information. The expected result is Negative.  Fact Sheet for Patients:  bloggercourse.com  Fact Sheet for Healthcare Providers:  seriousbroker.it  This test is no t yet approved or cleared by the [place]  FDA and  has been authorized for detection and/or diagnosis of SARS-CoV-2 by FDA under an Emergency Use Authorization (EUA). This EUA will remain  in effect (meaning this test can be used) for the duration of the COVID-19 declaration under Section 564(b)(1) of the Act, 21 U.S.C.section 360bbb-3(b)(1), unless the authorization is terminated  or revoked sooner.       Influenza A by PCR POSITIVE (A) NEGATIVE Final   Influenza B by PCR NEGATIVE NEGATIVE Final    Comment: (NOTE) The Xpert Xpress SARS-CoV-2/FLU/RSV plus assay is intended as an aid in the diagnosis of influenza from Nasopharyngeal swab  specimens and should not be used as a sole basis for treatment. Nasal washings and aspirates are unacceptable for Xpert  Xpress SARS-CoV-2/FLU/RSV testing.  Fact Sheet for Patients: bloggercourse.com  Fact Sheet for Healthcare Providers: seriousbroker.it  This test is not yet approved or cleared by the [place]  FDA and has been authorized for detection and/or diagnosis of SARS-CoV-2 by FDA under an Emergency Use Authorization (EUA). This EUA will remain in effect (meaning this test can be used) for the duration of the COVID-19 declaration under Section 564(b)(1) of the Act, 21 U.S.C. section 360bbb-3(b)(1), unless the authorization is terminated or revoked.     Resp Syncytial Virus by PCR NEGATIVE NEGATIVE Final    Comment: (NOTE) Fact Sheet for Patients: bloggercourse.com  Fact Sheet for Healthcare Providers: seriousbroker.it  This test is not yet approved or cleared by the [place]  FDA and has been authorized for detection and/or diagnosis of SARS-CoV-2 by FDA under an Emergency Use Authorization (EUA). This EUA will remain in effect (meaning this test can be used) for the duration of the COVID-19 declaration under Section 564(b)(1) of the Act, 21 U.S.C. section 360bbb-3(b)(1), unless the authorization is terminated or revoked.  Performed at [facility], [address], [place], [place] [address]   MRSA Next Gen by PCR, Nasal     Status: Abnormal   Collection Time: 04/16/23 10:43 PM   Specimen: Nasal Mucosa; Nasal Swab  Result Value Ref Range Status   MRSA by PCR Next Gen DETECTED (A) NOT DETECTED Final    Comment: RESULT CALLED TO, READ BACK BY AND VERIFIED WITH: TONYA,RN@0049  04/17/23 MK (NOTE) The GeneXpert MRSA Assay (FDA approved for NASAL specimens only), is one component of a comprehensive MRSA colonization surveillance program. It  is not intended to diagnose MRSA infection nor to guide or monitor treatment for MRSA infections. Test performance is not FDA approved in patients less than 53 years old. Performed at [facility], [address], [place], [place] [address]     Radiology Studies: DG Abd 1 View Result Date: 04/20/2023 CLINICAL DATA:  78 year old female with nausea and vomiting. History of breast cancer. EXAM: ABDOMEN - 1 VIEW COMPARISON:  Restaging CT Abdomen and Pelvis 11/23/2022. FINDINGS: AP view at 1049 hours. Chronic pelvic surgical clips. Chronic distal large bowel diverticulosis. Non obstructed bowel gas pattern. Stable abdominal visceral contours. No acute or suspicious osseous abnormality identified. IMPRESSION: Non obstructed bowel gas pattern. Chronic distal large bowel diverticulosis. Electronically Signed   By: VEAR Hurst M.D.   On: 04/20/2023 12:07   Scheduled Meds:  albuterol   5 mg Nebulization Once   [START ON 04/30/2023] amiodarone   200 mg Oral Daily   amiodarone   400 mg Oral BID   anastrozole   1 mg Oral Daily   arformoterol   15 mcg Nebulization BID   aspirin  EC  81 mg Oral Daily   atorvastatin   80 mg Oral q AM   bisacodyl   10 mg Rectal Q0600   budesonide  (PULMICORT ) nebulizer solution  0.5 mg Nebulization BID   Chlorhexidine  Gluconate Cloth  6 each Topical Q0600   Chlorhexidine  Gluconate Cloth  6 each Topical Q0600   doxycycline   100 mg Oral Q12H   febuxostat   40 mg Oral q AM   fluticasone   1 spray Each Nare Daily   heparin  sodium (porcine)       metoprolol  succinate  100 mg Oral Daily   mupirocin  ointment  1 Application Nasal BID   pantoprazole   40 mg Oral BID   polyethylene glycol  17 g Oral Daily   predniSONE   40 mg Oral Q breakfast  Followed by   [START ON 04/25/2023] predniSONE   20 mg Oral Q breakfast   senna-docusate  2 tablet Oral BID   sevelamer  carbonate  800 mg Oral TID WC   traZODone   25 mg Oral QHS   Continuous Infusions:  heparin  1,200 Units/hr (04/21/23 1354)    piperacillin -tazobactam (ZOSYN )  IV 2.25 g (04/21/23 0314)     LOS: 5 days    Time spent: 50 mins    Darcel Dawley, MD [facility]   If 7PM-7AM, please contact night-coverage

## 2023-04-21 NOTE — Progress Notes (Signed)
 [facility] Progress Note  Subjective: seen in room. HR's are much better in the 70 -90s. Got 3.3 L off today  Vitals:   04/21/23 1200 04/21/23 1220 04/21/23 1230 04/21/23 1606  BP: (!) 113/95 123/64 (!) 110/52 (!) 98/46  Pulse: 69 (!) 52 72   Resp: 20 20 18    Temp:   97.9 F (36.6 C) 98.2 F (36.8 C)  TempSrc:    Oral  SpO2: 96% 99% 100%   Weight:   65.7 kg   Height:        Exam: Gen alert, no sig ^wob today No rash, cyanosis or gangrene Sclera anicteric, throat clear  No jvd or bruits Chest clear bilat  RRR no MRG Abd soft ntnd no mass or ascites +bs Ext no LE or UE edema, no wounds or ulcers Neuro is alert, Ox 3 , nf    R AVF +bruit, recent wound healing/ RIJ TDC    OP HD: NW TTS 4h   400/1.5   2/2.5 bath  64 kg  Heparin  4000   Right TDC (revised RUE AVF)  - mircera 50 mcg q 2, last 1/02, due 1/16     Assessment/ Plan: Acute hypoxic resp failure - due to pna and poss vol excess. Not on home O2. Got UF 2 L w/ HD Tuesday and 2 L off Thursday. Continues to look good today. 3.3 L off w/ HD today, came off still 1.5kg over dry wt. HFNC peaked at 6L on 1/09, improving down to 1-2 L today.  Flu A/ multifocal pna - by CXR and CT chest. Per pmd Afib / RVR - affecting her condition. Started IV amio 1/09 for afib w/ RVR. Now HR is much better w/ HR's 70-90 (NSR and /or afib).  ESRD - on HD TTS. Had HD here Tuesday and Thursday. HD today.  HTN - BP's are wnl, cont home meds  Volume - as above, not sure accurate weights, but cannot stand. No edema. 1kg up post HD today.  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 Follow.  Secondary hyperparathyroidism - CCa in range. Cont renvela  as binder ac.  HD access - sp AVF surgery 04/13/23, per [facility] should be ready 4 wks from 04/13/23.        Myer Fret MD  [facility] 04/21/2023, 4:15 PM  Recent Labs  Lab 04/17/23 0018 04/17/23 9371 04/18/23 0243 04/19/23 0226 04/20/23 0926 04/21/23 0210  HGB  --    < > 10.3*   < >  11.0* 9.6*  ALBUMIN  2.6*  --  2.9*  --   --   --   CALCIUM  8.6*  --  8.8*   < > 9.6 9.0  CREATININE 6.77*  --  3.90*   < > 4.18* 5.44*  K 3.9  --  3.5   < > 4.5 3.9   < > = values in this interval not displayed.   No results for input(s): IRON, TIBC, FERRITIN in the last 168 hours. Inpatient medications:  albuterol   5 mg Nebulization Once   [START ON 04/30/2023] amiodarone   200 mg Oral Daily   amiodarone   400 mg Oral BID   anastrozole   1 mg Oral Daily   arformoterol   15 mcg Nebulization BID   aspirin  EC  81 mg Oral Daily   atorvastatin   80 mg Oral q AM   bisacodyl   10 mg Rectal Q0600   budesonide  (PULMICORT ) nebulizer solution  0.5 mg Nebulization BID  Chlorhexidine  Gluconate Cloth  6 each Topical Q0600   Chlorhexidine  Gluconate Cloth  6 each Topical Q0600   doxycycline   100 mg Oral Q12H   febuxostat   40 mg Oral q AM   fluticasone   1 spray Each Nare Daily   heparin  sodium (porcine)       metoprolol  succinate  100 mg Oral Daily   mupirocin  ointment  1 Application Nasal BID   pantoprazole   40 mg Oral BID   polyethylene glycol  17 g Oral Daily   predniSONE   40 mg Oral Q breakfast   Followed by   [name] ON 04/25/2023] predniSONE   20 mg Oral Q breakfast   senna-docusate  2 tablet Oral BID   sevelamer  carbonate  800 mg Oral TID WC   traZODone   25 mg Oral QHS    heparin  1,200 Units/hr (04/21/23 1354)   piperacillin -tazobactam (ZOSYN )  IV 2.25 g (04/21/23 1517)   acetaminophen  **OR** acetaminophen , diclofenac  Sodium, heparin  sodium (porcine), ondansetron  **OR** ondansetron  (ZOFRAN ) IV

## 2023-04-22 DIAGNOSIS — Z515 Encounter for palliative care: Secondary | ICD-10-CM

## 2023-04-22 DIAGNOSIS — Z7189 Other specified counseling: Secondary | ICD-10-CM

## 2023-04-22 DIAGNOSIS — I480 Paroxysmal atrial fibrillation: Secondary | ICD-10-CM | POA: Diagnosis not present

## 2023-04-22 DIAGNOSIS — I214 Non-ST elevation (NSTEMI) myocardial infarction: Secondary | ICD-10-CM | POA: Diagnosis not present

## 2023-04-22 LAB — BASIC METABOLIC PANEL
Anion gap: 15 (ref 5–15)
BUN: 36 mg/dL — ABNORMAL HIGH (ref 8–23)
CO2: 26 mmol/L (ref 22–32)
Calcium: 9.3 mg/dL (ref 8.9–10.3)
Chloride: 99 mmol/L (ref 98–111)
Creatinine, Ser: 4.18 mg/dL — ABNORMAL HIGH (ref 0.44–1.00)
GFR, Estimated: 10 mL/min — ABNORMAL LOW (ref >60)
Glucose, Bld: 106 mg/dL — ABNORMAL HIGH (ref 70–99)
Potassium: 3.2 mmol/L — ABNORMAL LOW (ref 3.5–5.1)
Sodium: 140 mmol/L (ref 135–145)

## 2023-04-22 LAB — CBC
HCT: 33.2 % — ABNORMAL LOW (ref 36.0–46.0)
Hemoglobin: 11.1 g/dL — ABNORMAL LOW (ref 12.0–15.0)
MCH: 31.2 pg (ref 26.0–34.0)
MCHC: 33.4 g/dL (ref 30.0–36.0)
MCV: 93.3 fL (ref 80.0–100.0)
Platelets: 382 10*3/uL (ref 150–400)
RBC: 3.56 MIL/uL — ABNORMAL LOW (ref 3.87–5.11)
RDW: 13.4 % (ref 11.5–15.5)
WBC: 12.5 10*3/uL — ABNORMAL HIGH (ref 4.0–10.5)
nRBC: 0 % (ref 0.0–0.2)

## 2023-04-22 LAB — PHOSPHORUS: Phosphorus: 3.1 mg/dL (ref 2.5–4.6)

## 2023-04-22 LAB — HEPARIN LEVEL (UNFRACTIONATED): Heparin Unfractionated: 0.49 [IU]/mL (ref 0.30–0.70)

## 2023-04-22 LAB — MAGNESIUM: Magnesium: 2.1 mg/dL (ref 1.7–2.4)

## 2023-04-22 MED ORDER — POTASSIUM CHLORIDE 20 MEQ PO PACK
40.0000 meq | PACK | Freq: Once | ORAL | Status: AC
Start: 2023-04-22 — End: 2023-04-22
  Administered 2023-04-22: 40 meq via ORAL
  Filled 2023-04-22: qty 2

## 2023-04-22 MED ORDER — POTASSIUM CHLORIDE CRYS ER 20 MEQ PO TBCR: 40.0000 meq | EXTENDED_RELEASE_TABLET | Freq: Once | ORAL | Status: DC

## 2023-04-22 NOTE — Progress Notes (Signed)
 PHARMACY - ANTICOAGULATION CONSULT NOTE  Pharmacy Consult for heparin  Indication: chest pain/ACS  Allergies  Allergen Reactions   Ace Inhibitors     angioedema   Lactose Intolerance (Gi) Other (See Comments)    GI upset   Letrozole  Hives   Lisinopril Swelling   Mercury     Red eyes, through eye drops that contained thimerosal    Tape Dermatitis    plastic    Patient Measurements: Height: 5' 2 (157.5 cm) Weight: 65.7 kg (144 lb 13.5 oz) IBW/kg (Calculated) : 50.1 Heparin  Dosing Weight: 63kg  Vital Signs: Temp: 98.3 F (36.8 C) (01/12 0359) Temp Source: Oral (01/12 0359) BP: 130/47 (01/12 0359) Pulse Rate: 62 (01/12 0359)  Labs: Recent Labs    04/19/23 1309 04/20/23 0925 04/20/23 0926 04/21/23 0210 04/22/23 0219  HGB   < >  --  11.0* 9.6* 11.1*  HCT  --   --  33.6* 29.2* 33.2*  PLT  --   --  329 316 382  HEPARINUNFRC  --  0.32  --  0.44 0.49  CREATININE  --   --  4.18* 5.44* 4.18*   < > = values in this interval not displayed.    Estimated Creatinine Clearance: 9.7 mL/min (A) (by C-G formula based on SCr of 4.18 mg/dL (H)).   Assessment: 54 yof presented to the ED with SOB. Troponin elevated and with afib/RVR. Pharmacy consulted for heparin . Patient was switched to enoxaparin  treatment dose 1/8 and back to heparin  1/9. Current plans are for the patient to undergo LHC on 1/13 and will start OAC after the procedure.  Heparin  level therapeutic at 0.49 on 1200 units/hr.  CBC stable.  No bleeding or bruising, no issues with infusion or bleeding per RN.   Goal of Therapy:  Heparin  level 0.3-0.7 units/ml Monitor platelets by anticoagulation protocol: Yes   Plan:   Continue heparin  gtt 1200 units/hr   Plans for [facility] tomorrow (1/13) and for patient to be switched to DOAC at this point. Monitor daily heparin  level, CBC, and signs of bleeding.  Signe Dawn, PharmD PGY2 Cardiology Pharmacy Resident  Please check AMION for all [facility] Pharmacy phone numbers After  10:00 PM, call Main Pharmacy [phone]

## 2023-04-22 NOTE — Plan of Care (Signed)

## 2023-04-22 NOTE — Progress Notes (Signed)
 Progress Note  Patient Name: Rachel Villarreal Date of Encounter: 04/22/2023  Primary Cardiologist:   None   Subjective   No chest pain.  Breathing improved.   Inpatient Medications    Scheduled Meds:  albuterol   5 mg Nebulization Once   [START ON 04/30/2023] amiodarone   200 mg Oral Daily   amiodarone   400 mg Oral BID   anastrozole   1 mg Oral Daily   arformoterol   15 mcg Nebulization BID   aspirin  EC  81 mg Oral Daily   atorvastatin   80 mg Oral q AM   bisacodyl   10 mg Rectal Q0600   budesonide  (PULMICORT ) nebulizer solution  0.5 mg Nebulization BID   Chlorhexidine  Gluconate Cloth  6 each Topical Q0600   Chlorhexidine  Gluconate Cloth  6 each Topical Q0600   doxycycline   100 mg Oral Q12H   febuxostat   40 mg Oral q AM   fluticasone   1 spray Each Nare Daily   metoprolol  succinate  100 mg Oral Daily   mupirocin  ointment  1 Application Nasal BID   pantoprazole   40 mg Oral BID   polyethylene glycol  17 g Oral Daily   predniSONE   40 mg Oral Q breakfast   Followed by   [name] ON 04/25/2023] predniSONE   20 mg Oral Q breakfast   senna-docusate  2 tablet Oral BID   sevelamer  carbonate  800 mg Oral TID WC   traZODone   25 mg Oral QHS   Continuous Infusions:  heparin  1,200 Units/hr (04/21/23 1354)   piperacillin -tazobactam (ZOSYN )  IV 2.25 g (04/22/23 0656)   PRN Meds: acetaminophen  **OR** acetaminophen , diclofenac  Sodium, ondansetron  **OR** ondansetron  (ZOFRAN ) IV   Vital Signs    Vitals:   04/21/23 1230 04/21/23 1606 04/21/23 1958 04/22/23 0359  BP: (!) 110/52 (!) 98/46 (!) 114/45 (!) 130/47  Pulse: 72   62  Resp: 18   (!) 23  Temp: 97.9 F (36.6 C) 98.2 F (36.8 C) 98.6 F (37 C) 98.3 F (36.8 C)  TempSrc:  Oral Oral Oral  SpO2: 100%   97%  Weight: 65.7 kg     Height:        Intake/Output Summary (Last 24 hours) at 04/22/2023 0735 Last data filed at 04/21/2023 2156 Gross per 24 hour  Intake 120 ml  Output 3300 ml  Net -3180 ml   Filed Weights   04/20/23  0427 04/21/23 0817 04/21/23 1230  Weight: 69 kg 69 kg 65.7 kg    Telemetry    NSR, PACs - Personally Reviewed  ECG    NA - Personally Reviewed  Physical Exam   GEN: No  acute distress.   Neck: No  JVD Cardiac: RRR, no murmurs, rubs, or gallops.  Respiratory:     Decreased breath sounds at the bases.  Scattered wheezing and coarse crackles.  GI: Soft, nontender, non-distended, normal bowel sounds  MS:  No edema; No deformity. Neuro:   Nonfocal  Psych: Oriented and appropriate    Labs    Chemistry Recent Labs  Lab 04/17/23 0018 04/18/23 0243 04/19/23 1309 04/20/23 0926 04/21/23 0210 04/22/23 0219  NA 134* 136   < > 135 133* 140  K 3.9 3.5   < > 4.5 3.9 3.2*  CL 95* 96*   < > 92* 96* 99  CO2 19* 26   < > 27 20* 26  GLUCOSE 81 105*   < > 113* 103* 106*  BUN 49* 23   < > 31* 51* 36*  CREATININE 6.77* 3.90*   < > 4.18* 5.44* 4.18*  CALCIUM  8.6* 8.8*   < > 9.6 9.0 9.3  PROT 5.4* 6.2*  --   --   --   --   ALBUMIN  2.6* 2.9*  --   --   --   --   AST 40 33  --   --   --   --   ALT <5 7  --   --   --   --   ALKPHOS 71 81  --   --   --   --   BILITOT 1.6* 1.0  --   --   --   --   GFRNONAA 6* 11*   < > 10* 8* 10*  ANIONGAP 20* 14   < > 16* 17* 15   < > = values in this interval not displayed.     Hematology Recent Labs  Lab 04/20/23 0926 04/21/23 0210 04/22/23 0219  WBC 12.0* 14.2* 12.5*  RBC 3.52* 3.06* 3.56*  HGB 11.0* 9.6* 11.1*  HCT 33.6* 29.2* 33.2*  MCV 95.5 95.4 93.3  MCH 31.3 31.4 31.2  MCHC 32.7 32.9 33.4  RDW 13.7 13.6 13.4  PLT 329 316 382    Cardiac EnzymesNo results for input(s): TROPONINI in the last 168 hours. No results for input(s): TROPIPOC in the last 168 hours.   BNP Recent Labs  Lab 04/16/23 1230  BNP 3,699.7*     DDimer No results for input(s): DDIMER in the last 168 hours.   Radiology    DG Abd 1 View Result Date: 04/20/2023 CLINICAL DATA:  78 year old female with nausea and vomiting. History of breast cancer. EXAM:  ABDOMEN - 1 VIEW COMPARISON:  Restaging CT Abdomen and Pelvis 11/23/2022. FINDINGS: AP view at 1049 hours. Chronic pelvic surgical clips. Chronic distal large bowel diverticulosis. Non obstructed bowel gas pattern. Stable abdominal visceral contours. No acute or suspicious osseous abnormality identified. IMPRESSION: Non obstructed bowel gas pattern. Chronic distal large bowel diverticulosis. Electronically Signed   By: VEAR Hurst M.D.   On: 04/20/2023 12:07    Cardiac Studies   Echo:   1. Left ventricular ejection fraction, by estimation, is 35 to 40%. The  left ventricle has moderately decreased function. The left ventricle  demonstrates global hypokinesis. There is mild concentric left ventricular  hypertrophy. Left ventricular  diastolic parameters are indeterminate. There is the interventricular  septum is flattened in systole and diastole, consistent with right  ventricular pressure and volume overload.   2. Right ventricular systolic function is moderately reduced. The right  ventricular size is moderately enlarged. There is mildly elevated  pulmonary artery systolic pressure. The estimated right ventricular  systolic pressure is 44.0 mmHg.   3. Left atrial size was severely dilated.   4. Right atrial size was moderately dilated.   5. The mitral valve is grossly normal. Mild mitral valve regurgitation.  No evidence of mitral stenosis.   6. Tricuspid valve regurgitation is mild to moderate.   7. The aortic valve is tricuspid. Aortic valve regurgitation is not  visualized. No aortic stenosis is present.   8. The inferior vena cava is dilated in size with >50% respiratory  variability, suggesting right atrial pressure of 8 mmHg.    Patient Profile     78 y.o. female with a hx of ESRD on IHD, HTN, HLD, prior breast CA, gout, and GERD who is being seen 04/16/2023 for the evaluation of elevated troponins at the request of Dr. Jerrol.  Assessment & Plan    Acute hypoxic respiratory  failure:   Multifactorial.  Continue care per primary team.   Atrial fib with RVR:    Continue PO amiodarone  and change to DOAC after cath.   Maintaining NSR.  Change to 200 mg amiodarone  at discharge.   NSTEMI:  She consents to cath before discharge.  Will plan for Monday.    HTN:    This has been low.   She did tolerate the Toprol  XL 100 mg yesterday.  Continue with this.    ESRD: Per nephrology  Acute systolic HF:    Volume per dialysis.     Hypokalemia:  Supplemented.   For questions or updates, please contact [facility] Please consult [email or web address] for contact info under Cardiology/STEMI.   Signed, Lynwood Schilling, MD  04/22/2023, 7:35 AM

## 2023-04-22 NOTE — Progress Notes (Signed)
 [facility] Progress Note  Subjective: seen in room. HR's are much better in the 70- 80 range.3.3 L uf w/ hd yest  Vitals:   04/21/23 1606 04/21/23 1958 04/22/23 0359 04/22/23 0822  BP: (!) 98/46 (!) 114/45 (!) 130/47 (!) 138/93  Pulse:   62 70  Resp:   (!) 23 (!) 26  Temp: 98.2 F (36.8 C) 98.6 F (37 C) 98.3 F (36.8 C) 98.2 F (36.8 C)  TempSrc: Oral Oral Oral Oral  SpO2:   97% 92%  Weight:      Height:        Exam: Gen alert, comfortable No jvd or bruits Chest clear bilat  RRR no MRG Abd soft ntnd no mass or ascites +bs Ext no LE edema  Neuro is alert, Ox 3 , nf    R AVF +bruit, recent wound healing/ RIJ TDC    OP HD: NW TTS 4h   400/1.5   2/2.5 bath  64 kg  Heparin  4000   Right TDC (revised RUE AVF)  - mircera 50 mcg q 2, last 1/02, due 1/16     Assessment/ Plan: Acute hypoxic resp failure - due to pna and vol excess. No home O2. Got UF 2 L w/ HD Tuesday and 2 L off Thursday and 3.3 L Sat. Continues to improve from resp standpoint.  Flu A/ multifocal pna - by CXR and CT chest. Per pmd Afib / RVR - started IV amio 1/09 for afib w/ RVR. HR is much better w/ HR's 70-80s  ESRD - on HD TTS. Had HD here Tuesday, Thursday and Sat. Next HD 1/14.  HTN - BP's are wnl, cont home meds  Volume - euvolemic on exam, 1kg up by wts. Follow.  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 Follow.  Secondary hyperparathyroidism - CCa in range. Cont renvela  as binder ac.  HD access - sp AVF surgery 04/13/23, per [facility] should be ready 4 wks from 04/13/23.        Myer Fret MD  [facility] 04/22/2023, 10:38 AM  Recent Labs  Lab 04/17/23 0018 04/17/23 9371 04/18/23 0243 04/19/23 0226 04/21/23 0210 04/22/23 0219  HGB  --    < > 10.3*   < > 9.6* 11.1*  ALBUMIN  2.6*  --  2.9*  --   --   --   CALCIUM  8.6*  --  8.8*   < > 9.0 9.3  PHOS  --   --   --   --   --  3.1  CREATININE 6.77*  --  3.90*   < > 5.44* 4.18*  K 3.9  --  3.5   < > 3.9 3.2*   < > = values in this  interval not displayed.   No results for input(s): IRON, TIBC, FERRITIN in the last 168 hours. Inpatient medications:  albuterol   5 mg Nebulization Once   [START ON 04/30/2023] amiodarone   200 mg Oral Daily   amiodarone   400 mg Oral BID   anastrozole   1 mg Oral Daily   arformoterol   15 mcg Nebulization BID   aspirin  EC  81 mg Oral Daily   atorvastatin   80 mg Oral q AM   bisacodyl   10 mg Rectal Q0600   budesonide  (PULMICORT ) nebulizer solution  0.5 mg Nebulization BID   Chlorhexidine  Gluconate Cloth  6 each Topical Q0600   Chlorhexidine  Gluconate Cloth  6 each Topical Q0600   doxycycline   100 mg Oral Q12H  febuxostat   40 mg Oral q AM   fluticasone   1 spray Each Nare Daily   metoprolol  succinate  100 mg Oral Daily   pantoprazole   40 mg Oral BID   polyethylene glycol  17 g Oral Daily   predniSONE   40 mg Oral Q breakfast   Followed by   [name] ON 04/25/2023] predniSONE   20 mg Oral Q breakfast   senna-docusate  2 tablet Oral BID   sevelamer  carbonate  800 mg Oral TID WC   traZODone   25 mg Oral QHS    heparin  1,200 Units/hr (04/21/23 1354)   piperacillin -tazobactam (ZOSYN )  IV 2.25 g (04/22/23 0656)   acetaminophen  **OR** acetaminophen , diclofenac  Sodium, ondansetron  **OR** ondansetron  (ZOFRAN ) IV

## 2023-04-22 NOTE — Consult Note (Signed)
 Palliative Care Consult Note                                  Date: 04/22/2023   Patient Name: Rachel Villarreal  DOB: Apr 05, 1946  MRN: [id number]  Age / Sex: 78 y.o., female  PCP: Rachel Other, MD Referring Physician: Leotis Bogus, MD  Reason for Consultation: Establishing goals of care  HPI/Patient Profile: 78 y.o. female  with past medical history of ESRD on HD, breast cancer, asthma, and hypertension who presented to the ED on 04/16/2023 with shortness of breath.  She was found to have multifocal pneumonia, in the setting of influenza A infection.  Hospitalization complicated by evidence of NSTEMI as well as atrial fibrillation with RVR.  Palliative Medicine has been consulted for goals of care discussions.   Subjective:   Extensive chart review has been completed prior to meeting with patient/family including labs, vital signs, imaging, progress/consult notes, orders, medications and available advance directive documents.   I met with patient at bedside to discuss diagnosis, prognosis, and GOC.  I introduced Palliative Medicine as specialized medical care for people with serious illness.   Created space and opportunity for patient and family to express thoughts and feelings regarding current medical situation. Values and goals of care were attempted to be elicited.  Life Review: Rachel Villarreal is originally from [place], [place]. She attended pharmacy school at [employer]. 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   Functional Status: Prior to admission, she lived independently at home.   GOC Discussion: We discussed patient's current illness and what it means in the larger context of her ongoing co-morbidities. Hospital course and current clinical status was reviewed. We reviewed her acute and chronic issues including ESRD, breast cancer, influenza A with pneumonia, NSTEMI, acute on chronic HFrEF,and atrial fibrillation. As a retired  teacher, early years/pre, Rachel Villarreal has an excellent understanding of her medical issues.   A discussion was had today regarding advanced directives. Rachel Villarreal reports that her next of kin is her cousin, but she unfortunately has some health issues and resides in a facility.  Rachel Villarreal has designated her cousin's son Rachel Villarreal as her HCPOA. Rachel Villarreal's wife Rachel Villarreal handles everything and is listed as her emergency contact.   We also reviewed the MOST form on file from 2021. Concepts specific to code status, artifical feeding and hydration, continued IV antibiotics and rehospitalization were reviewed. Rachel Villarreal agrees with the treatment preferences as previously documented:  Cardiopulmonary Resuscitation: Attempt Resuscitation (CPR)  Medical Interventions: Full Scope of Treatment: Use intubation, advanced airway interventions, mechanical ventilation, cardioversion as indicated, medical treatment, IV fluids, etc, also provide comfort measures. Transfer to the hospital if indicated  Antibiotics: Antibiotics if indicated  IV Fluids: IV fluids for a defined trial period  Feeding Tube: Left Blank       Review of Systems  Cardiovascular:  Negative for chest pain.    Objective:   Primary Diagnoses: Present on Admission:  Non-ST elevated myocardial infarction (HCC)  ESRD (end stage renal disease) (HCC)  Influenza A with pneumonia  Essential hypertension  Paroxysmal atrial fibrillation with RVR (HCC)   Physical Exam Vitals reviewed.  Constitutional:      General: She is not in acute distress. Cardiovascular:     Rate and Rhythm: Normal rate.  Pulmonary:     Effort: Pulmonary effort is normal.  Neurological:     Mental Status: She is alert and oriented to  person, place, and time.    Palliative Assessment/Data: PPS 60%     Assessment & Plan:   SUMMARY OF RECOMMENDATIONS   Continue full scope treatment Goal of care is medical stabilization and recovery PMT will continue to follow  Primary Decision  Maker: PATIENT HCPOA is Rachel Villarreal (his wife Rachel Villarreal is the emergency contact)  Code Status/Advance Care Planning: Full code  Prognosis:  Unable to determine  Discharge Planning:  To Be Determined     Thank you for allowing us  to participate in the care of Rachel Villarreal   Time Total: 75 minutes  Detailed review of medical records (labs, imaging, vital signs), medically appropriate exam, discussed with treatment team, counseling and education to patient, family, & staff, documenting clinical information, coordination of care.   Signed by: Recardo Loll, NP Palliative Medicine Team  Team Phone # [phone]  For individual providers, please see AMION

## 2023-04-22 NOTE — Progress Notes (Signed)
 PROGRESS NOTE    Rachel Villarreal  FMW:[id number] DOB: 10-Mar-1946 DOA: 04/16/2023 PCP: Ransom Other, MD   Brief Narrative:  This 78 y.o. female with medical history significant of HTN, ESRD on HD, breast CA admitted for SOB. She was found to have multifocal pneumonia, in the setting of influenza A infection. Hospitalization complicated by evidence of NSTEMI, In addition to atrial fibrillation with RVR.  Cardiology and Nephrology was consulted  Assessment & Plan:   Principal Problem:   Non-ST elevated myocardial infarction [facility]) Active Problems:   Influenza A with pneumonia   Paroxysmal atrial fibrillation with RVR (HCC)   ESRD (end stage renal disease) (HCC)   Essential hypertension   NSTEMI: Currently chest pain free.  Cardiology on board, recommended left heart cath.  Plan for left heart cath on Monday. Continue with IV heparin , aspirin  and statin.    Acute hypoxic respiratory failure, multifactorial: Likely due  to a combination of aspiration pneumonia, influenza A and fluid overload with a component of asthma exacerbation Continue IV zosyn  for possible aspiration  and Continue doxycycline  .  Continue supplemental oxygen to keep saturation above 90% .  She required upto 6 lit/min , slowly weaning her oxygen down to 2 lit/min.  PCCM consulted.  New leukocytosis but remains afebrile, slowly improving.  SLP evaluation recommended regular diet with thin liquid, with compensations.  On steroid taper   Acute on Chronic HFrEF Fluid management with HD.    Paroxysmal atrial fibrillation with RVR (HCC) Heart rate better controlled.  Maintaining normal sinus rhythm. Continue amiodarone  400 mg twice daily for 10 days. Change to amiodarone  200 mg daily at discharge. Continue IV heparin .  Plan DOAC after catheterization.   ESRD (end stage renal disease) (HCC) TTS schedule Nephrology on board.     Essential hypertension: Well controlled.  Continue hydralazine  25 mg TID.     Hyperlipidemia:  Continue with lipitor  80 mg daily.    Breast Cancer: ER positive , PR positive.  Continue with anastrozole .    Anemia of chronic disease: Hemoglobin is stable.  No any obvious visible bleeding.   Constipation:  Continue Senokot and MiraLAX    GERD: Continue Protonix  40 twice daily     Estimated body mass index is 27.82 kg/m as calculated from the following:   Height as of this encounter: 5' 2 (1.575 m).   Weight as of this encounter: 69 kg.    DVT prophylaxis: Heparin  IV Code Status:Full code Family Communication: No family at bed side. Disposition Plan:   Status is: Inpatient Remains inpatient appropriate because: Admitted for  NSTEMI, plan for left heart cath Monday   Consultants:  Cardiology Nephrology  Procedures: None  Antimicrobials:  Anti-infectives (From admission, onward)    Start     Dose/Rate Route Frequency Ordered Stop   04/19/23 1800  oseltamivir  (TAMIFLU ) capsule 30 mg        30 mg Oral Every T-Th-Sa (1800) 04/18/23 0947 04/20/23 0629   04/19/23 1130  doxycycline  (VIBRA -TABS) tablet 100 mg        100 mg Oral Every 12 hours 04/19/23 1043 04/24/23 0959   04/19/23 1030  piperacillin -tazobactam (ZOSYN ) IVPB 2.25 g        2.25 g 100 mL/hr over 30 Minutes Intravenous Every 8 hours 04/19/23 0949     04/17/23 2300  oseltamivir  (TAMIFLU ) capsule 30 mg        30 mg Oral Every T-Th-Sa (1800) 04/17/23 2101 04/18/23 0213   04/17/23 2200  oseltamivir  (TAMIFLU ) capsule 30  mg  Status:  Discontinued        30 mg Oral Daily at bedtime 04/17/23 0858 04/17/23 2101   04/17/23 1300  cefTRIAXone  (ROCEPHIN ) 2 g in sodium chloride  0.9 % 100 mL IVPB  Status:  Discontinued        2 g 200 mL/hr over 30 Minutes Intravenous Every 24 hours 04/17/23 1212 04/19/23 0914   04/17/23 1300  azithromycin  (ZITHROMAX ) tablet 500 mg  Status:  Discontinued        500 mg Oral Daily 04/17/23 1212 04/19/23 0914   04/17/23 1000  oseltamivir  (TAMIFLU ) capsule 30 mg  Status:   Discontinued        30 mg Oral Daily 04/16/23 2308 04/17/23 0858   04/16/23 1845  cefTRIAXone  (ROCEPHIN ) 1 g in sodium chloride  0.9 % 100 mL IVPB        1 g 200 mL/hr over 30 Minutes Intravenous  Once 04/16/23 1844 04/16/23 1930   04/16/23 1845  azithromycin  (ZITHROMAX ) 500 mg in sodium chloride  0.9 % 250 mL IVPB        500 mg 250 mL/hr over 60 Minutes Intravenous  Once 04/16/23 1844 04/16/23 2037   04/16/23 1545  oseltamivir  (TAMIFLU ) capsule 30 mg        30 mg Oral  Once 04/16/23 1543 04/16/23 1618      Subjective: Patient was seen and examined at bedside.  Overnight events noted.   Patient reports doing much better.  Denies any chest pain.  She is weaned down to 2 L of supplemental oxygen. She is scheduled to have left heart cath tomorrow.  Objective: Vitals:   04/21/23 1958 04/22/23 0359 04/22/23 0822 04/22/23 1045  BP: (!) 114/45 (!) 130/47 (!) 138/93   Pulse:  62 70   Resp:  (!) 23 (!) 26   Temp: 98.6 F (37 C) 98.3 F (36.8 C) 98.2 F (36.8 C)   TempSrc: Oral Oral Oral   SpO2:  97% 92% 97%  Weight:      Height:        Intake/Output Summary (Last 24 hours) at 04/22/2023 1127 Last data filed at 04/21/2023 2156 Gross per 24 hour  Intake 120 ml  Output 3300 ml  Net -3180 ml   Filed Weights   04/20/23 0427 04/21/23 0817 04/21/23 1230  Weight: 69 kg 69 kg 65.7 kg    Examination:  General exam: Appears comfortable, deconditioned, not in any acute distress. Respiratory system: CTA bilaterally. Respiratory effort normal.  RR 15 Cardiovascular system: S1 & S2 heard, RRR. No JVD, murmurs, rubs, gallops or clicks.  Gastrointestinal system: Abdomen is non distended, soft and non tender. Normal bowel sounds heard. Central nervous system: Alert and oriented x 3. No focal neurological deficits. Extremities: Symmetric 5 x 5 power. Skin: No rashes, lesions or ulcers Psychiatry: Judgement and insight appear normal. Mood & affect appropriate.     Data Reviewed: I have  personally reviewed following labs and imaging studies  CBC: Recent Labs  Lab 04/16/23 1230 04/17/23 0628 04/18/23 0243 04/19/23 0226 04/20/23 0926 04/21/23 0210 04/22/23 0219  WBC 7.8   < > 8.8 13.5* 12.0* 14.2* 12.5*  NEUTROABS 6.1  --   --   --   --   --   --   HGB 9.5*   < > 10.3* 11.3* 11.0* 9.6* 11.1*  HCT 28.4*   < > 30.5* 33.4* 33.6* 29.2* 33.2*  MCV 95.3   < > 93.0 93.0 95.5 95.4 93.3  PLT 182   < >  251 307 329 316 382   < > = values in this interval not displayed.   Basic Metabolic Panel: Recent Labs  Lab 04/17/23 0018 04/18/23 0243 04/19/23 1309 04/20/23 0926 04/21/23 0210 04/22/23 0219  NA 134* 136 135 135 133* 140  K 3.9 3.5 4.3 4.5 3.9 3.2*  CL 95* 96* 96* 92* 96* 99  CO2 19* 26 25 27  20* 26  GLUCOSE 81 105* 185* 113* 103* 106*  BUN 49* 23 46* 31* 51* 36*  CREATININE 6.77* 3.90* 6.45* 4.18* 5.44* 4.18*  CALCIUM  8.6* 8.8* 9.3 9.6 9.0 9.3  MG 2.1  --  2.3  --   --  2.1  PHOS  --   --   --   --   --  3.1   GFR: Estimated Creatinine Clearance: 9.7 mL/min (A) (by C-G formula based on SCr of 4.18 mg/dL (H)). Liver Function Tests: Recent Labs  Lab 04/17/23 0018 04/18/23 0243  AST 40 33  ALT <5 7  ALKPHOS 71 81  BILITOT 1.6* 1.0  PROT 5.4* 6.2*  ALBUMIN  2.6* 2.9*   No results for input(s): LIPASE, AMYLASE in the last 168 hours. No results for input(s): AMMONIA in the last 168 hours. Coagulation Profile: No results for input(s): INR, PROTIME in the last 168 hours. Cardiac Enzymes: No results for input(s): CKTOTAL, CKMB, CKMBINDEX, TROPONINI in the last 168 hours. BNP (last 3 results) No results for input(s): PROBNP in the last 8760 hours. HbA1C: No results for input(s): HGBA1C in the last 72 hours. CBG: No results for input(s): GLUCAP in the last 168 hours. Lipid Profile: No results for input(s): CHOL, HDL, LDLCALC, TRIG, CHOLHDL, LDLDIRECT in the last 72 hours. Thyroid  Function Tests: No results for  input(s): TSH, T4TOTAL, FREET4, T3FREE, THYROIDAB in the last 72 hours. Anemia Panel: No results for input(s): VITAMINB12, FOLATE, FERRITIN, TIBC, IRON, RETICCTPCT in the last 72 hours. Sepsis Labs: Recent Labs  Lab 04/17/23 0018 04/17/23 0628 04/19/23 0226  PROCALCITON 3.18  --   --   LATICACIDVEN  --  1.0 1.0    Recent Results (from the past 240 hours)  Resp panel by RT-PCR (RSV, Flu A&B, Covid) Anterior Nasal Swab     Status: Abnormal   Collection Time: 04/16/23 12:30 PM   Specimen: Anterior Nasal Swab  Result Value Ref Range Status   SARS Coronavirus 2 by RT PCR NEGATIVE NEGATIVE Final    Comment: (NOTE) SARS-CoV-2 target nucleic acids are NOT DETECTED.  The SARS-CoV-2 RNA is generally detectable in upper respiratory specimens during the acute phase of infection. The lowest concentration of SARS-CoV-2 viral copies this assay can detect is 138 copies/mL. A negative result does not preclude SARS-Cov-2 infection and should not be used as the sole basis for treatment or other patient management decisions. A negative result may occur with  improper specimen collection/handling, submission of specimen other than nasopharyngeal swab, presence of viral mutation(s) within the areas targeted by this assay, and inadequate number of viral copies(<138 copies/mL). A negative result must be combined with clinical observations, patient history, and epidemiological information. The expected result is Negative.  Fact Sheet for Patients:  bloggercourse.com  Fact Sheet for Healthcare Providers:  seriousbroker.it  This test is no t yet approved or cleared by the [place]  FDA and  has been authorized for detection and/or diagnosis of SARS-CoV-2 by FDA under an Emergency Use Authorization (EUA). This EUA will remain  in effect (meaning this test can be used) for the duration of the  COVID-19 declaration under Section  564(b)(1) of the Act, 21 U.S.C.section 360bbb-3(b)(1), unless the authorization is terminated  or revoked sooner.       Influenza A by PCR POSITIVE (A) NEGATIVE Final   Influenza B by PCR NEGATIVE NEGATIVE Final    Comment: (NOTE) The Xpert Xpress SARS-CoV-2/FLU/RSV plus assay is intended as an aid in the diagnosis of influenza from Nasopharyngeal swab specimens and should not be used as a sole basis for treatment. Nasal washings and aspirates are unacceptable for Xpert Xpress SARS-CoV-2/FLU/RSV testing.  Fact Sheet for Patients: bloggercourse.com  Fact Sheet for Healthcare Providers: seriousbroker.it  This test is not yet approved or cleared by the [place]  FDA and has been authorized for detection and/or diagnosis of SARS-CoV-2 by FDA under an Emergency Use Authorization (EUA). This EUA will remain in effect (meaning this test can be used) for the duration of the COVID-19 declaration under Section 564(b)(1) of the Act, 21 U.S.C. section 360bbb-3(b)(1), unless the authorization is terminated or revoked.     Resp Syncytial Virus by PCR NEGATIVE NEGATIVE Final    Comment: (NOTE) Fact Sheet for Patients: bloggercourse.com  Fact Sheet for Healthcare Providers: seriousbroker.it  This test is not yet approved or cleared by the [place]  FDA and has been authorized for detection and/or diagnosis of SARS-CoV-2 by FDA under an Emergency Use Authorization (EUA). This EUA will remain in effect (meaning this test can be used) for the duration of the COVID-19 declaration under Section 564(b)(1) of the Act, 21 U.S.C. section 360bbb-3(b)(1), unless the authorization is terminated or revoked.  Performed at [facility], [address], [place], [place] [address]   MRSA Next Gen by PCR, Nasal     Status: Abnormal   Collection Time: 04/16/23 10:43 PM    Specimen: Nasal Mucosa; Nasal Swab  Result Value Ref Range Status   MRSA by PCR Next Gen DETECTED (A) NOT DETECTED Final    Comment: RESULT CALLED TO, READ BACK BY AND VERIFIED WITH: TONYA,RN@0049  04/17/23 MK (NOTE) The GeneXpert MRSA Assay (FDA approved for NASAL specimens only), is one component of a comprehensive MRSA colonization surveillance program. It is not intended to diagnose MRSA infection nor to guide or monitor treatment for MRSA infections. Test performance is not FDA approved in patients less than 31 years old. Performed at [facility], [address] [place], [place] [address]     Radiology Studies: No results found.  Scheduled Meds:  albuterol   5 mg Nebulization Once   [START ON 04/30/2023] amiodarone   200 mg Oral Daily   amiodarone   400 mg Oral BID   anastrozole   1 mg Oral Daily   arformoterol   15 mcg Nebulization BID   aspirin  EC  81 mg Oral Daily   atorvastatin   80 mg Oral q AM   bisacodyl   10 mg Rectal Q0600   budesonide  (PULMICORT ) nebulizer solution  0.5 mg Nebulization BID   Chlorhexidine  Gluconate Cloth  6 each Topical Q0600   Chlorhexidine  Gluconate Cloth  6 each Topical Q0600   doxycycline   100 mg Oral Q12H   febuxostat   40 mg Oral q AM   fluticasone   1 spray Each Nare Daily   metoprolol  succinate  100 mg Oral Daily   pantoprazole   40 mg Oral BID   polyethylene glycol  17 g Oral Daily   predniSONE   40 mg Oral Q breakfast   Followed by   [name] ON 04/25/2023] predniSONE   20 mg Oral Q breakfast   senna-docusate  2 tablet Oral BID   sevelamer  carbonate  800 mg Oral TID WC   traZODone   25 mg Oral QHS   Continuous Infusions:  heparin  1,200 Units/hr (04/21/23 1354)   piperacillin -tazobactam (ZOSYN )  IV 2.25 g (04/22/23 0656)     LOS: 6 days    Time spent: 35 mins    Darcel Dawley, MD [facility]   If 7PM-7AM, please contact night-coverage

## 2023-04-22 NOTE — Progress Notes (Signed)
 Mobility Specialist Progress Note:   04/22/23 1303  Mobility  Activity Ambulated with assistance in room  Level of Assistance Contact guard assist, steadying assist  Assistive Device  (IV Pole)  Distance Ambulated (ft) 30 ft  Activity Response Tolerated well  Mobility Referral Yes  Mobility visit 1 Mobility  Mobility Specialist Start Time (ACUTE ONLY) 1038  Mobility Specialist Stop Time (ACUTE ONLY) 1048  Mobility Specialist Time Calculation (min) (ACUTE ONLY) 10 min   Pt received in bed, agreeable to mobility. CG to stand and ambulate short distance in room. Ambulated on 1 L. VSS. Pt c/o slight fatigue towards EOS, otherwise asx throughout. Pt left in bed with call bell in reach and all needs met.   Brown Husband  Mobility Specialist Please contact via [employer] office at [phone]

## 2023-04-23 ENCOUNTER — Encounter (HOSPITAL_COMMUNITY)
Admission: EM | Disposition: A | Discharge status: Skilled Nursing Facility | Payer: Self-pay | Source: Home / Self Care | Attending: Internal Medicine

## 2023-04-23 DIAGNOSIS — I214 Non-ST elevation (NSTEMI) myocardial infarction: Secondary | ICD-10-CM | POA: Diagnosis not present

## 2023-04-23 DIAGNOSIS — I4891 Unspecified atrial fibrillation: Secondary | ICD-10-CM

## 2023-04-23 HISTORY — PX: RIGHT/LEFT HEART CATH AND CORONARY ANGIOGRAPHY: CATH118266

## 2023-04-23 LAB — POCT I-STAT 7, (LYTES, BLD GAS, ICA,H+H)
Acid-base deficit: 3 mmol/L — ABNORMAL HIGH (ref 0.0–2.0)
Bicarbonate: 23.5 mmol/L (ref 20.0–28.0)
Calcium, Ion: 1.27 mmol/L (ref 1.15–1.40)
HCT: 30 % — ABNORMAL LOW (ref 36.0–46.0)
Hemoglobin: 10.2 g/dL — ABNORMAL LOW (ref 12.0–15.0)
O2 Saturation: 94 %
Potassium: 3.7 mmol/L (ref 3.5–5.1)
Sodium: 134 mmol/L — ABNORMAL LOW (ref 135–145)
TCO2: 25 mmol/L (ref 22–32)
pCO2 arterial: 47.1 mm[Hg] (ref 32–48)
pH, Arterial: 7.307 — ABNORMAL LOW (ref 7.35–7.45)
pO2, Arterial: 77 mm[Hg] — ABNORMAL LOW (ref 83–108)

## 2023-04-23 LAB — POCT I-STAT EG7
Acid-base deficit: 2 mmol/L (ref 0.0–2.0)
Acid-base deficit: 2 mmol/L (ref 0.0–2.0)
Bicarbonate: 24.3 mmol/L (ref 20.0–28.0)
Bicarbonate: 24.3 mmol/L (ref 20.0–28.0)
Calcium, Ion: 1.25 mmol/L (ref 1.15–1.40)
Calcium, Ion: 1.29 mmol/L (ref 1.15–1.40)
HCT: 31 % — ABNORMAL LOW (ref 36.0–46.0)
HCT: 31 % — ABNORMAL LOW (ref 36.0–46.0)
Hemoglobin: 10.5 g/dL — ABNORMAL LOW (ref 12.0–15.0)
Hemoglobin: 10.5 g/dL — ABNORMAL LOW (ref 12.0–15.0)
O2 Saturation: 63 %
O2 Saturation: 64 %
Potassium: 3.7 mmol/L (ref 3.5–5.1)
Potassium: 3.8 mmol/L (ref 3.5–5.1)
Sodium: 135 mmol/L (ref 135–145)
Sodium: 136 mmol/L (ref 135–145)
TCO2: 26 mmol/L (ref 22–32)
TCO2: 26 mmol/L (ref 22–32)
pCO2, Ven: 48.5 mm[Hg] (ref 44–60)
pCO2, Ven: 47.1 mm[Hg] (ref 44–60)
pH, Ven: 7.308 (ref 7.25–7.43)
pH, Ven: 7.32 (ref 7.25–7.43)
pO2, Ven: 36 mm[Hg] (ref 32–45)
pO2, Ven: 36 mm[Hg] (ref 32–45)

## 2023-04-23 LAB — BASIC METABOLIC PANEL
Anion gap: 19 — ABNORMAL HIGH (ref 5–15)
BUN: 60 mg/dL — ABNORMAL HIGH (ref 8–23)
CO2: 21 mmol/L — ABNORMAL LOW (ref 22–32)
Calcium: 9.4 mg/dL (ref 8.9–10.3)
Chloride: 98 mmol/L (ref 98–111)
Creatinine, Ser: 6.41 mg/dL — ABNORMAL HIGH (ref 0.44–1.00)
GFR, Estimated: 6 mL/min — ABNORMAL LOW (ref >60)
Glucose, Bld: 126 mg/dL — ABNORMAL HIGH (ref 70–99)
Potassium: 3.5 mmol/L (ref 3.5–5.1)
Sodium: 138 mmol/L (ref 135–145)

## 2023-04-23 LAB — CBC
HCT: 31.6 % — ABNORMAL LOW (ref 36.0–46.0)
Hemoglobin: 10.3 g/dL — ABNORMAL LOW (ref 12.0–15.0)
MCH: 31.1 pg (ref 26.0–34.0)
MCHC: 32.6 g/dL (ref 30.0–36.0)
MCV: 95.5 fL (ref 80.0–100.0)
Platelets: 322 10*3/uL (ref 150–400)
RBC: 3.31 MIL/uL — ABNORMAL LOW (ref 3.87–5.11)
RDW: 13.5 % (ref 11.5–15.5)
WBC: 13.8 10*3/uL — ABNORMAL HIGH (ref 4.0–10.5)
nRBC: 0 % (ref 0.0–0.2)

## 2023-04-23 LAB — PHOSPHORUS: Phosphorus: 4.1 mg/dL (ref 2.5–4.6)

## 2023-04-23 LAB — HEPARIN LEVEL (UNFRACTIONATED): Heparin Unfractionated: 0.48 [IU]/mL (ref 0.30–0.70)

## 2023-04-23 LAB — MAGNESIUM: Magnesium: 1.9 mg/dL (ref 1.7–2.4)

## 2023-04-23 SURGERY — RIGHT/LEFT HEART CATH AND CORONARY ANGIOGRAPHY
Anesthesia: LOCAL

## 2023-04-23 MED ORDER — LIDOCAINE HCL (PF) 1 % IJ SOLN
INTRAMUSCULAR | Status: AC
  Filled 2023-04-23: qty 30

## 2023-04-23 MED ORDER — FENTANYL CITRATE (PF) 100 MCG/2ML IJ SOLN
INTRAMUSCULAR | Status: DC | PRN
  Administered 2023-04-23: 25 ug via INTRAVENOUS

## 2023-04-23 MED ORDER — SODIUM CHLORIDE 0.9% FLUSH: 3.0000 mL | INTRAVENOUS | Status: DC | PRN

## 2023-04-23 MED ORDER — FENTANYL CITRATE (PF) 100 MCG/2ML IJ SOLN
INTRAMUSCULAR | Status: AC
  Filled 2023-04-23: qty 2

## 2023-04-23 MED ORDER — POTASSIUM CHLORIDE 20 MEQ PO PACK
40.0000 meq | PACK | Freq: Once | ORAL | Status: AC
  Administered 2023-04-23: 40 meq via ORAL
  Filled 2023-04-23: qty 2

## 2023-04-23 MED ORDER — HEPARIN (PORCINE) IN NACL 1000-0.9 UT/500ML-% IV SOLN
INTRAVENOUS | Status: DC | PRN
  Administered 2023-04-23 (×2): 500 mL

## 2023-04-23 MED ORDER — BOOST / RESOURCE BREEZE PO LIQD CUSTOM
1.0000 | Freq: Three times a day (TID) | ORAL | Status: DC
  Administered 2023-04-23 – 2023-04-24 (×3): 1 via ORAL

## 2023-04-23 MED ORDER — SODIUM CHLORIDE 0.9 % IV SOLN: INTRAVENOUS | Status: DC

## 2023-04-23 MED ORDER — RENA-VITE PO TABS
1.0000 | ORAL_TABLET | Freq: Every day | ORAL | Status: DC
Start: 2023-04-23 — End: 2023-04-25
  Administered 2023-04-23 – 2023-04-24 (×2): 1 via ORAL
  Filled 2023-04-23 (×2): qty 1

## 2023-04-23 MED ORDER — LIDOCAINE HCL (PF) 1 % IJ SOLN
INTRAMUSCULAR | Status: DC | PRN
  Administered 2023-04-23: 10 mL

## 2023-04-23 MED ORDER — POTASSIUM CHLORIDE CRYS ER 20 MEQ PO TBCR
40.0000 meq | EXTENDED_RELEASE_TABLET | Freq: Once | ORAL | Status: DC
Start: 2023-04-23 — End: 2023-04-23

## 2023-04-23 MED ORDER — IOHEXOL 350 MG/ML SOLN
INTRAVENOUS | Status: DC | PRN
  Administered 2023-04-23: 40 mL

## 2023-04-23 MED ORDER — MIDAZOLAM HCL 2 MG/2ML IJ SOLN
INTRAMUSCULAR | Status: AC
  Filled 2023-04-23: qty 2

## 2023-04-23 MED ORDER — APIXABAN 5 MG PO TABS
5.0000 mg | ORAL_TABLET | Freq: Two times a day (BID) | ORAL | Status: DC
  Administered 2023-04-23 – 2023-04-25 (×4): 5 mg via ORAL
  Filled 2023-04-23 (×4): qty 1

## 2023-04-23 MED ORDER — ASPIRIN 81 MG PO CHEW
81.0000 mg | CHEWABLE_TABLET | ORAL | Status: AC
Start: 2023-04-23 — End: 2023-04-23
  Administered 2023-04-23: 81 mg via ORAL
  Filled 2023-04-23: qty 1

## 2023-04-23 MED ORDER — SODIUM CHLORIDE 0.9% FLUSH
3.0000 mL | Freq: Two times a day (BID) | INTRAVENOUS | Status: DC
  Administered 2023-04-23 – 2023-04-25 (×5): 3 mL via INTRAVENOUS

## 2023-04-23 MED ORDER — HYDRALAZINE HCL 20 MG/ML IJ SOLN: 10.0000 mg | INTRAMUSCULAR | Status: AC | PRN

## 2023-04-23 MED ORDER — MIDAZOLAM HCL 2 MG/2ML IJ SOLN
INTRAMUSCULAR | Status: DC | PRN
  Administered 2023-04-23: 1 mg via INTRAVENOUS

## 2023-04-23 MED ORDER — SODIUM CHLORIDE 0.9 % IV SOLN: 250.0000 mL | INTRAVENOUS | Status: AC | PRN

## 2023-04-23 SURGICAL SUPPLY — 10 items
CATH INFINITI 5FR MULTPACK ANG (CATHETERS) IMPLANT
CATH SWAN GANZ 7F STRAIGHT (CATHETERS) IMPLANT
CLOSURE MYNX CONTROL 5F (Vascular Products) IMPLANT
KIT MICROPUNCTURE NIT STIFF (SHEATH) IMPLANT
PACK CARDIAC CATHETERIZATION (CUSTOM PROCEDURE TRAY) ×1 IMPLANT
SET ATX-X65L (MISCELLANEOUS) IMPLANT
SHEATH PINNACLE 5F 10CM (SHEATH) IMPLANT
SHEATH PINNACLE 7F 10CM (SHEATH) IMPLANT
SHEATH PROBE COVER 6X72 (BAG) IMPLANT
WIRE EMERALD 3MM-J .035X150CM (WIRE) IMPLANT

## 2023-04-23 NOTE — NC FL2 (Addendum)
 [place]  MEDICAID FL2 LEVEL OF CARE FORM     IDENTIFICATION  Patient Name: Rachel Villarreal Birthdate: [birth year] Sex: female Admission Date (Current Location): 04/16/2023  [facility] and [place] Number:  Producer, Television/film/video and Address:  [facility], [address] [place], [place] [address]      Provider Number: [id number]  Attending Physician Name and Address:  Jillian Buttery, MD  Relative Name and Phone Number:  Jon Cave; Relative; [phone]    Current Level of Care: Hospital Recommended Level of Care: Skilled Nursing Facility Prior Approval Number:    Date Approved/Denied:   PASRR Number:  [id number]  Discharge Plan: SNF    Current Diagnoses: Patient Active Problem List   Diagnosis Date Noted   Influenza A with pneumonia 04/17/2023   Paroxysmal atrial fibrillation with RVR (HCC) 04/17/2023   Non-ST elevated myocardial infarction (HCC) 04/16/2023   ESRD (end stage renal disease) (HCC) 03/09/2023   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   Insomnia 03/09/2023   History of hysterectomy, supracervical 03/01/2022   History of loop electrical excision procedure (LEEP) 03/01/2022   Breast cancer (HCC) 09/12/2021   Malignant neoplasm of upper-outer quadrant of left breast in female, estrogen receptor positive (HCC) 08/29/2021   Allergic rhinitis 01/20/2021   GERD (gastroesophageal reflux disease) 01/20/2021   Osteopenia 01/20/2021   Pure hypercholesterolemia 01/20/2021   Vitamin D deficiency 01/20/2021   Gout 03/28/2016   Essential hypertension 03/28/2016   Hyperlipidemia 03/28/2016   Hormone replacement therapy (HRT) 11/18/2013    Orientation RESPIRATION BLADDER Height & Weight     Self, Time, Situation, Place  Normal (Room Air) Continent Weight: 133 lb 6.1 oz (60.5 kg) Height:  5' 2 (157.5 cm)  BEHAVIORAL SYMPTOMS/MOOD NEUROLOGICAL BOWEL NUTRITION STATUS      Continent Diet (Please see dc summary)  AMBULATORY STATUS COMMUNICATION  OF NEEDS Skin   Limited Assist Verbally Surgical wounds (Incision (Closed) 04/17/23 Arm Anterior;Right;Upper)                       Personal Care Assistance Level of Assistance  Bathing, Feeding, Dressing Bathing Assistance: Limited assistance Feeding assistance: Limited assistance Dressing Assistance: Limited assistance     Functional Limitations Info             SPECIAL CARE FACTORS FREQUENCY  PT (By licensed PT), OT (By licensed OT)     PT Frequency: 5x OT Frequency: 5x            Contractures Contractures Info: Not present    Additional Factors Info  Code Status, Allergies Code Status Info: Full Code Allergies Info: Ace Inhibitors; Lactose Intolerance (GI); Letrozole ; Lisinopril; Mercury; Tape           Current Medications (04/23/2023):  This is the current hospital active medication list Current Facility-Administered Medications  Medication Dose Route Frequency Provider Last Rate Last Admin   0.9 %  sodium chloride  infusion  250 mL Intravenous PRN Jordan, Peter M, MD       acetaminophen  (TYLENOL ) tablet 650 mg  650 mg Oral Q6H PRN Jordan, Peter M, MD   650 mg at 04/23/23 1006   Or   acetaminophen  (TYLENOL ) suppository 650 mg  650 mg Rectal Q6H PRN Jordan, Peter M, MD       albuterol  (PROVENTIL ) (2.5 MG/3ML) 0.083% nebulizer solution 5 mg  5 mg Nebulization Once Jordan, Peter M, MD       [name] ON 04/30/2023] amiodarone  (PACERONE ) tablet 200 mg  200 mg Oral Daily Jordan, Peter M, MD       amiodarone  (PACERONE ) tablet 400 mg  400 mg Oral BID Jordan, Peter M, MD   400 mg at 04/23/23 9043   anastrozole  (ARIMIDEX ) tablet 1 mg  1 mg Oral Daily Jordan, Peter M, MD   1 mg at 04/23/23 1003   apixaban  (ELIQUIS ) tablet 5 mg  5 mg Oral BID Jordan, Peter M, MD       arformoterol  (BROVANA ) nebulizer solution 15 mcg  15 mcg Nebulization BID Jordan, Peter M, MD   15 mcg at 04/23/23 0809   aspirin  EC tablet 81 mg  81 mg Oral Daily Jordan, Peter M, MD   81 mg at 04/22/23  9143   atorvastatin  (LIPITOR ) tablet 80 mg  80 mg Oral q AM Jordan, Peter M, MD   80 mg at 04/23/23 0600   bisacodyl  (DULCOLAX) suppository 10 mg  10 mg Rectal Q0600 Jordan, Peter M, MD   10 mg at 04/20/23 1524   budesonide  (PULMICORT ) nebulizer solution 0.5 mg  0.5 mg Nebulization BID Jordan, Peter M, MD   0.5 mg at 04/23/23 0809   Chlorhexidine  Gluconate Cloth 2 % PADS 6 each  6 each Topical Q0600 Jordan, Peter M, MD   6 each at 04/21/23 9347   Chlorhexidine  Gluconate Cloth 2 % PADS 6 each  6 each Topical Q0600 Jordan, Peter M, MD   6 each at 04/23/23 0539   diclofenac  Sodium (VOLTAREN ) 1 % topical gel 1 Application  1 Application Topical QID PRN Jordan, Peter M, MD   1 Application at 04/17/23 9167   doxycycline  (VIBRA -TABS) tablet 100 mg  100 mg Oral Q12H Jordan, Peter M, MD   100 mg at 04/23/23 9044   febuxostat  (ULORIC ) tablet 40 mg  40 mg Oral q AM Jordan, Peter M, MD   40 mg at 04/23/23 0554   feeding supplement (BOOST / RESOURCE BREEZE) liquid 1 Container  1 Container Oral TID BM Jillian Buttery, MD       fluticasone  (FLONASE ) 50 MCG/ACT nasal spray 1 spray  1 spray Each Nare Daily Jordan, Peter M, MD   1 spray at 04/23/23 9043   hydrALAZINE  (APRESOLINE ) injection 10 mg  10 mg Intravenous Q20 Min PRN Jordan, Peter M, MD       metoprolol  succinate (TOPROL -XL) 24 hr tablet 100 mg  100 mg Oral Daily Jordan, Peter M, MD   100 mg at 04/23/23 0955   multivitamin (RENA-VIT) tablet 1 tablet  1 tablet Oral QHS Jillian Buttery, MD       ondansetron  (ZOFRAN ) tablet 4 mg  4 mg Oral Q6H PRN Jordan, Peter M, MD       Or   ondansetron  (ZOFRAN ) injection 4 mg  4 mg Intravenous Q6H PRN Jordan, Peter M, MD   4 mg at 04/19/23 2125   pantoprazole  (PROTONIX ) EC tablet 40 mg  40 mg Oral BID Jordan, Peter M, MD   40 mg at 04/23/23 9044   piperacillin -tazobactam (ZOSYN ) IVPB 2.25 g  2.25 g Intravenous Q8H Jordan, Peter M, MD 100 mL/hr at 04/23/23 0601 2.25 g at 04/23/23 0601   polyethylene glycol (MIRALAX  /  GLYCOLAX ) packet 17 g  17 g Oral Daily Jordan, Peter M, MD   17 g at 04/22/23 9144   predniSONE  (DELTASONE ) tablet 40 mg  40 mg Oral Q breakfast Jordan, Peter M, MD   40 mg at 04/23/23 0600   Followed by   [name] ON 04/25/2023]  predniSONE  (DELTASONE ) tablet 20 mg  20 mg Oral Q breakfast Jordan, Peter M, MD       senna-docusate (Senokot-S) tablet 2 tablet  2 tablet Oral BID Jordan, Peter M, MD   2 tablet at 04/22/23 2222   sevelamer  carbonate (RENVELA ) tablet 800 mg  800 mg Oral TID WC Jordan, Peter M, MD   800 mg at 04/23/23 0955   sodium chloride  flush (NS) 0.9 % injection 3 mL  3 mL Intravenous Q12H Jordan, Peter M, MD       sodium chloride  flush (NS) 0.9 % injection 3 mL  3 mL Intravenous PRN Jordan, Peter M, MD       traZODone  (DESYREL ) tablet 25 mg  25 mg Oral QHS Jordan, Peter M, MD   25 mg at 04/22/23 2223     Discharge Medications: Please see discharge summary for a list of discharge medications.  Relevant Imaging Results:  Relevant Lab Results:   Additional Information SS# [id number]  Lauraine FORBES Saa, LCSW

## 2023-04-23 NOTE — Progress Notes (Signed)
 Initial Nutrition Assessment  DOCUMENTATION CODES:   Non-severe (moderate) malnutrition in context of acute illness/injury  INTERVENTION:  Boost Breeze po TID, each supplement provides 250 kcal and 9 grams of protein  Renal MVI Discussed importance of HBV protein intake  NUTRITION DIAGNOSIS:  Moderate Malnutrition related to inability to eat, acute illness as evidenced by energy intake < 75% for > 7 days, moderate muscle depletion.  GOAL:  Patient will meet greater than or equal to 90% of their needs  MONITOR:   PO intake  REASON FOR ASSESSMENT:   NPO/Clear Liquid Diet    ASSESSMENT:   Patient admitted for SOB and found to have multifocal PNA in setting of influenza A infection. Hospitalization complicated by NSTEMI, and A-fib w/ RVR. PMH: HTN, ESRD on iHD, breast CA.   She endorses adequate appetite w/ no significant changes in recent past. Is adherent with iHD txs. Treats TTS. NPO today for heart catheterization.   24-Hour Recall: B: English muffin w/ butter and lemonade L: Pimento cheese sandwich w/ water D: Turkey sandwich w/ chips (renal friendly) and water or lemonade Snack: Fruit  Averaging 50% intake x 6 meals documented. Patient does not like typical Ensure/Boost/Nepro supplements. Amicable to trying Boost Breeze. Reiterated importance of protein intake while admitted and in general r/t iHD txs as well as preserving lean body mass in working with therapy.   Admit Weight: 64kg Current Weight: 60.5kg  She could not report what her current TW is at [facility] facility or average IDWG. Weight has trended down over last six months since starting dialysis r/t fluid removal. Was in the 150s six months ago (15%), per patient report. Of note, patient supplemented with K+ today. Will start renal MVI.   Net IO Since Admission: -4,683.8 mL [04/23/23 1416]   Appears euvolemic with little to no swelling to BLEs. She is independent at baseline and lives alone. She drives herself to  and from dialysis treatments. Plans to return home upon discharge. No wounds present. Does have incision site to RUE d/t recent second step surgery to AVF.   Labs: K+ 3.5<--3.2<--3.9 CBGs 81-185mg /dL x 7 days J8r 5.2 (98/7974) WBC 13.8<--12.5<--14.2  Meds: prednisone , sevelamer  carbonate, pantoprazole , ABX, KCl   NUTRITION - FOCUSED PHYSICAL EXAM:  Flowsheet Row Most Recent Value  Orbital Region No depletion  Upper Arm Region No depletion  Thoracic and Lumbar Region No depletion  Buccal Region No depletion  Temple Region Moderate depletion  Clavicle Bone Region Mild depletion  Clavicle and Acromion Bone Region Mild depletion  Scapular Bone Region Mild depletion  Dorsal Hand Moderate depletion  Patellar Region Moderate depletion  Anterior Thigh Region Moderate depletion  Posterior Calf Region Moderate depletion  Edema (RD Assessment) None  Hair Reviewed  Eyes Reviewed  Mouth Reviewed  Skin Reviewed  Nails Reviewed       Diet Order:   Diet Order             Diet NPO time specified  Diet effective ____             EDUCATION NEEDS:   Education needs have been addressed  Skin:  Skin Assessment: Reviewed RN Assessment  Last BM:  01/12  Height:  Ht Readings from Last 1 Encounters:  04/16/23 5' 2 (1.575 m)   Weight:  Wt Readings from Last 1 Encounters:  04/23/23 60.5 kg    Ideal Body Weight:  50 kg  BMI:  Body mass index is 24.4 kg/m.  Estimated Nutritional Needs:  Kcal:  1700-1900kcal  Protein:  80-90g  Fluid:  1L + UOP  Blair Deaner MS, RD, LDN Registered Dietitian Clinical Nutrition RD Inpatient Contact Info in Amion

## 2023-04-23 NOTE — TOC Initial Note (Signed)
 Transition of Care [facility]) - Initial/Assessment Note    Patient Details  Name: Rachel Villarreal MRN: [id number] Date of Birth: 08/12/1945  Transition of [facility]) CM/SW Contact:    Lauraine FORBES Saa, LCSW Phone Number: 04/23/2023, 4:27 PM  Clinical Narrative:                  4:27 PM CSW spoke with patient regarding therapy recommendation of SNF. Patient expressed interest in and consented to discharging to SNFs in [facility]. CSW educated patient on SNF (coverage, process). Patient expressed understanding of this.  Expected Discharge Plan: Skilled Nursing Facility Barriers to Discharge: Continued Medical Work up, SNF Pending bed offer   Patient Goals and CMS Choice Patient states their goals for this hospitalization and ongoing recovery are:: SNF CMS Medicare.gov Compare Post Acute Care list provided to:: Patient Choice offered to / list presented to : Patient      Expected Discharge Plan and Services In-house Referral: Clinical Social Work   Post Acute Care Choice: Skilled Nursing Facility Living arrangements for the past 2 months: Single Family Home                                      Prior Living Arrangements/Services Living arrangements for the past 2 months: Single Family Home Lives with:: Self Patient language and need for interpreter reviewed:: Yes Do you feel safe going back to the place where you live?: Yes      Need for Family Participation in Patient Care: Yes (Comment) Care giver support system in place?: Yes (comment)   Criminal Activity/Legal Involvement Pertinent to Current Situation/Hospitalization: No - Comment as needed  Activities of Daily Living   ADL Screening (condition at time of admission) Independently performs ADLs?: Yes (appropriate for developmental age) Is the patient deaf or have difficulty hearing?: No Does the patient have difficulty seeing, even when wearing glasses/contacts?: No Does the patient have difficulty  concentrating, remembering, or making decisions?: No  Permission Sought/Granted Permission sought to share information with : Family Supports, Oceanographer granted to share information with : No, Yes, Verbal Permission Granted (Contact information on chart)  Share Information with NAME: Jon Cave  Permission granted to share info w AGENCY: SNF  Permission granted to share info w Relationship: Relative  Permission granted to share info w Contact Information: [phone]  Emotional Assessment   Attitude/Demeanor/Rapport: Engaged Affect (typically observed): Accepting, Appropriate, Adaptable, Calm, Stable, Pleasant Orientation: : Oriented to Self, Oriented to Place, Oriented to  Time, Oriented to Situation Alcohol  / Substance Use: Not Applicable Psych Involvement: No (comment)  Admission diagnosis:  Influenza A [J10.1] Hypoxia [R09.02] Troponin level elevated [R79.89] ESRD on hemodialysis (HCC) [N18.6, Z99.2] Non-ST elevated myocardial infarction [facility]) [I21.4] Patient Active Problem List   Diagnosis Date Noted   Influenza A with pneumonia 04/17/2023   Paroxysmal atrial fibrillation with RVR (HCC) 04/17/2023   Non-ST elevated myocardial infarction (HCC) 04/16/2023   ESRD (end stage renal disease) (HCC) 03/09/2023   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   Insomnia 03/09/2023   History of hysterectomy, supracervical 03/01/2022   History of loop electrical excision procedure (LEEP) 03/01/2022   Breast cancer (HCC) 09/12/2021   Malignant neoplasm of upper-outer quadrant of left breast in female, estrogen receptor positive (HCC) 08/29/2021   Allergic rhinitis 01/20/2021   GERD (gastroesophageal reflux disease) 01/20/2021   Osteopenia 01/20/2021   Pure hypercholesterolemia 01/20/2021   Vitamin  D deficiency 01/20/2021   Gout 03/28/2016   Essential hypertension 03/28/2016   Hyperlipidemia 03/28/2016   Hormone replacement therapy (HRT) 11/18/2013   PCP:   Ransom Other, MD Pharmacy:   [id number] [address] [place], [place] - [address] [place] [address] Phone: [phone] Fax: [phone]  [facility] [address] [place] [address] Phone: [phone] Fax: [phone]     Social Drivers of Health (SDOH) Social History: SDOH Screenings   Food Insecurity: No Food Insecurity (04/16/2023)  Housing: Low Risk  (04/16/2023)  Transportation Needs: No Transportation Needs (04/16/2023)  Utilities: Not At Risk (04/16/2023)  Depression (PHQ2-9): Low Risk  (02/28/2022)  Financial Resource Strain: Low Risk  (09/02/2021)  Social Connections: Moderately Integrated (04/16/2023)  Stress: No Stress Concern Present (09/02/2021)  Tobacco Use: Medium Risk (04/16/2023)   SDOH Interventions:     Readmission Risk Interventions     No data to display

## 2023-04-23 NOTE — Progress Notes (Signed)
 Progress Note  Patient Name: Rachel Villarreal Date of Encounter: 04/23/2023  Primary Cardiologist:   None   Subjective   Ambulated in room on 1 liter yesterday.  Breathing better.  No pain.    Inpatient Medications    Scheduled Meds:  albuterol   5 mg Nebulization Once   [START ON 04/30/2023] amiodarone   200 mg Oral Daily   amiodarone   400 mg Oral BID   anastrozole   1 mg Oral Daily   arformoterol   15 mcg Nebulization BID   aspirin  EC  81 mg Oral Daily   atorvastatin   80 mg Oral q AM   bisacodyl   10 mg Rectal Q0600   budesonide  (PULMICORT ) nebulizer solution  0.5 mg Nebulization BID   Chlorhexidine  Gluconate Cloth  6 each Topical Q0600   Chlorhexidine  Gluconate Cloth  6 each Topical Q0600   doxycycline   100 mg Oral Q12H   febuxostat   40 mg Oral q AM   fluticasone   1 spray Each Nare Daily   metoprolol  succinate  100 mg Oral Daily   pantoprazole   40 mg Oral BID   polyethylene glycol  17 g Oral Daily   predniSONE   40 mg Oral Q breakfast   Followed by   [name] ON 04/25/2023] predniSONE   20 mg Oral Q breakfast   senna-docusate  2 tablet Oral BID   sevelamer  carbonate  800 mg Oral TID WC   traZODone   25 mg Oral QHS   Continuous Infusions:  sodium chloride  10 mL/hr at 04/23/23 0617   heparin  1,200 Units/hr (04/21/23 1354)   piperacillin -tazobactam (ZOSYN )  IV 2.25 g (04/23/23 0601)   PRN Meds: acetaminophen  **OR** acetaminophen , diclofenac  Sodium, ondansetron  **OR** ondansetron  (ZOFRAN ) IV   Vital Signs    Vitals:   04/22/23 1613 04/22/23 1946 04/22/23 2243 04/23/23 0530  BP: (!) 136/51 118/79 137/60 (!) 145/53  Pulse: 67 68 65 62  Resp: 16 19 18    Temp: 98.4 F (36.9 C) 98.1 F (36.7 C) 98.2 F (36.8 C) 98.2 F (36.8 C)  TempSrc: Oral Oral Oral Oral  SpO2: 100% 100% 100%   Weight:    60.5 kg  Height:        Intake/Output Summary (Last 24 hours) at 04/23/2023 0721 Last data filed at 04/22/2023 1700 Gross per 24 hour  Intake 600 ml  Output --  Net 600 ml    Filed Weights   04/21/23 0817 04/21/23 1230 04/23/23 0530  Weight: 69 kg 65.7 kg 60.5 kg    Telemetry    NSR, PACs - Personally Reviewed  ECG    NA - Personally Reviewed  Physical Exam   GEN: No  acute distress.   Neck: No  JVD Cardiac: RRR, no murmurs, rubs, or gallops.  Respiratory:     Decreased breath sounds with coarse crackles at the right base.  GI: Soft, nontender, non-distended, normal bowel sounds  MS:  No edema; No deformity. Neuro:   Nonfocal  Psych: Oriented and appropriate     Labs    Chemistry Recent Labs  Lab 04/17/23 0018 04/18/23 0243 04/19/23 1309 04/21/23 0210 04/22/23 0219 04/23/23 0223  NA 134* 136   < > 133* 140 138  K 3.9 3.5   < > 3.9 3.2* 3.5  CL 95* 96*   < > 96* 99 98  CO2 19* 26   < > 20* 26 21*  GLUCOSE 81 105*   < > 103* 106* 126*  BUN 49* 23   < > 51*  36* 60*  CREATININE 6.77* 3.90*   < > 5.44* 4.18* 6.41*  CALCIUM  8.6* 8.8*   < > 9.0 9.3 9.4  PROT 5.4* 6.2*  --   --   --   --   ALBUMIN  2.6* 2.9*  --   --   --   --   AST 40 33  --   --   --   --   ALT <5 7  --   --   --   --   ALKPHOS 71 81  --   --   --   --   BILITOT 1.6* 1.0  --   --   --   --   GFRNONAA 6* 11*   < > 8* 10* 6*  ANIONGAP 20* 14   < > 17* 15 19*   < > = values in this interval not displayed.     Hematology Recent Labs  Lab 04/21/23 0210 04/22/23 0219 04/23/23 0223  WBC 14.2* 12.5* 13.8*  RBC 3.06* 3.56* 3.31*  HGB 9.6* 11.1* 10.3*  HCT 29.2* 33.2* 31.6*  MCV 95.4 93.3 95.5  MCH 31.4 31.2 31.1  MCHC 32.9 33.4 32.6  RDW 13.6 13.4 13.5  PLT 316 382 322    Cardiac EnzymesNo results for input(s): TROPONINI in the last 168 hours. No results for input(s): TROPIPOC in the last 168 hours.   BNP Recent Labs  Lab 04/16/23 1230  BNP 3,699.7*     DDimer No results for input(s): DDIMER in the last 168 hours.   Radiology    No results found.   Cardiac Studies   Echo:   1. Left ventricular ejection fraction, by estimation, is 35 to  40%. The  left ventricle has moderately decreased function. The left ventricle  demonstrates global hypokinesis. There is mild concentric left ventricular  hypertrophy. Left ventricular  diastolic parameters are indeterminate. There is the interventricular  septum is flattened in systole and diastole, consistent with right  ventricular pressure and volume overload.   2. Right ventricular systolic function is moderately reduced. The right  ventricular size is moderately enlarged. There is mildly elevated  pulmonary artery systolic pressure. The estimated right ventricular  systolic pressure is 44.0 mmHg.   3. Left atrial size was severely dilated.   4. Right atrial size was moderately dilated.   5. The mitral valve is grossly normal. Mild mitral valve regurgitation.  No evidence of mitral stenosis.   6. Tricuspid valve regurgitation is mild to moderate.   7. The aortic valve is tricuspid. Aortic valve regurgitation is not  visualized. No aortic stenosis is present.   8. The inferior vena cava is dilated in size with >50% respiratory  variability, suggesting right atrial pressure of 8 mmHg.    Patient Profile     78 y.o. female with a hx of ESRD on IHD, HTN, HLD, prior breast CA, gout, and GERD who is being seen 04/16/2023 for the evaluation of elevated troponins at the request of Dr. Jerrol.   Assessment & Plan    Acute hypoxic respiratory failure:   Multifactorial.  Continues to make progress.  Wean O2 as possible   Atrial fib with RVR:    Continue PO amiodarone  load changing to 200 mg at discharge.   Start DOAC after cath.   Maintaining NSR.    NSTEMI:   Cath this afternoon.    HTN:    BP has been labile but tolerating beta blocker.  I am holding off on  trying to titrate meds further at this time.    ESRD: Per nephrology  Acute systolic HF:    Volume per dialysis.   Hypokalemia:  Will give additional potassium today.    For questions or updates, please contact [facility] Please consult [email or web address] for contact info under Cardiology/STEMI.   Signed, Lynwood Schilling, MD  04/23/2023, 7:21 AM

## 2023-04-23 NOTE — H&P (View-Only) (Signed)
 Progress Note  Patient Name: Rachel Villarreal Date of Encounter: 04/23/2023  Primary Cardiologist:   None   Subjective   Ambulated in room on 1 liter yesterday.  Breathing better.  No pain.    Inpatient Medications    Scheduled Meds:  albuterol   5 mg Nebulization Once   [START ON 04/30/2023] amiodarone   200 mg Oral Daily   amiodarone   400 mg Oral BID   anastrozole   1 mg Oral Daily   arformoterol   15 mcg Nebulization BID   aspirin  EC  81 mg Oral Daily   atorvastatin   80 mg Oral q AM   bisacodyl   10 mg Rectal Q0600   budesonide  (PULMICORT ) nebulizer solution  0.5 mg Nebulization BID   Chlorhexidine  Gluconate Cloth  6 each Topical Q0600   Chlorhexidine  Gluconate Cloth  6 each Topical Q0600   doxycycline   100 mg Oral Q12H   febuxostat   40 mg Oral q AM   fluticasone   1 spray Each Nare Daily   metoprolol  succinate  100 mg Oral Daily   pantoprazole   40 mg Oral BID   polyethylene glycol  17 g Oral Daily   predniSONE   40 mg Oral Q breakfast   Followed by   Rachel Villarreal ON 04/25/2023] predniSONE   20 mg Oral Q breakfast   senna-docusate  2 tablet Oral BID   sevelamer  carbonate  800 mg Oral TID WC   traZODone   25 mg Oral QHS   Continuous Infusions:  sodium chloride  10 mL/hr at 04/23/23 0617   heparin  1,200 Units/hr (04/21/23 1354)   piperacillin -tazobactam (ZOSYN )  IV 2.25 g (04/23/23 0601)   PRN Meds: acetaminophen  **OR** acetaminophen , diclofenac  Sodium, ondansetron  **OR** ondansetron  (ZOFRAN ) IV   Vital Signs    Vitals:   04/22/23 1613 04/22/23 1946 04/22/23 2243 04/23/23 0530  BP: (!) 136/51 118/79 137/60 (!) 145/53  Pulse: 67 68 65 62  Resp: 16 19 18    Temp: 98.4 F (36.9 C) 98.1 F (36.7 C) 98.2 F (36.8 C) 98.2 F (36.8 C)  TempSrc: Oral Oral Oral Oral  SpO2: 100% 100% 100%   Weight:    60.5 kg  Height:        Intake/Output Summary (Last 24 hours) at 04/23/2023 0721 Last data filed at 04/22/2023 1700 Gross per 24 hour  Intake 600 ml  Output --  Net 600 ml    Filed Weights   04/21/23 0817 04/21/23 1230 04/23/23 0530  Weight: 69 kg 65.7 kg 60.5 kg    Telemetry    NSR, PACs - Personally Reviewed  ECG    NA - Personally Reviewed  Physical Exam   GEN: No  acute distress.   Neck: No  JVD Cardiac: RRR, no murmurs, rubs, or gallops.  Respiratory:     Decreased breath sounds with coarse crackles at the right base.  GI: Soft, nontender, non-distended, normal bowel sounds  MS:  No edema; No deformity. Neuro:   Nonfocal  Psych: Oriented and appropriate     Labs    Chemistry Recent Labs  Lab 04/17/23 0018 04/18/23 0243 04/19/23 1309 04/21/23 0210 04/22/23 0219 04/23/23 0223  NA 134* 136   < > 133* 140 138  K 3.9 3.5   < > 3.9 3.2* 3.5  CL 95* 96*   < > 96* 99 98  CO2 19* 26   < > 20* 26 21*  GLUCOSE 81 105*   < > 103* 106* 126*  BUN 49* 23   < > 51*  36* 60*  CREATININE 6.77* 3.90*   < > 5.44* 4.18* 6.41*  CALCIUM  8.6* 8.8*   < > 9.0 9.3 9.4  PROT 5.4* 6.2*  --   --   --   --   ALBUMIN  2.6* 2.9*  --   --   --   --   AST 40 33  --   --   --   --   ALT <5 7  --   --   --   --   ALKPHOS 71 81  --   --   --   --   BILITOT 1.6* 1.0  --   --   --   --   GFRNONAA 6* 11*   < > 8* 10* 6*  ANIONGAP 20* 14   < > 17* 15 19*   < > = values in this interval not displayed.     Hematology Recent Labs  Lab 04/21/23 0210 04/22/23 0219 04/23/23 0223  WBC 14.2* 12.5* 13.8*  RBC 3.06* 3.56* 3.31*  HGB 9.6* 11.1* 10.3*  HCT 29.2* 33.2* 31.6*  MCV 95.4 93.3 95.5  MCH 31.4 31.2 31.1  MCHC 32.9 33.4 32.6  RDW 13.6 13.4 13.5  PLT 316 382 322    Cardiac EnzymesNo results for input(s): TROPONINI in the last 168 hours. No results for input(s): TROPIPOC in the last 168 hours.   BNP Recent Labs  Lab 04/16/23 1230  BNP 3,699.7*     DDimer No results for input(s): DDIMER in the last 168 hours.   Radiology    No results found.   Cardiac Studies   Echo:   1. Left ventricular ejection fraction, by estimation, is 35 to  40%. The  left ventricle has moderately decreased function. The left ventricle  demonstrates global hypokinesis. There is mild concentric left ventricular  hypertrophy. Left ventricular  diastolic parameters are indeterminate. There is the interventricular  septum is flattened in systole and diastole, consistent with right  ventricular pressure and volume overload.   2. Right ventricular systolic function is moderately reduced. The right  ventricular size is moderately enlarged. There is mildly elevated  pulmonary artery systolic pressure. The estimated right ventricular  systolic pressure is 44.0 mmHg.   3. Left atrial size was severely dilated.   4. Right atrial size was moderately dilated.   5. The mitral valve is grossly normal. Mild mitral valve regurgitation.  No evidence of mitral stenosis.   6. Tricuspid valve regurgitation is mild to moderate.   7. The aortic valve is tricuspid. Aortic valve regurgitation is not  visualized. No aortic stenosis is present.   8. The inferior vena cava is dilated in size with >50% respiratory  variability, suggesting right atrial pressure of 8 mmHg.    Patient Profile     78 y.o. female with a hx of ESRD on IHD, HTN, HLD, prior breast CA, gout, and GERD who is being seen 04/16/2023 for the evaluation of elevated troponins at the request of Dr. Jerrol.   Assessment & Plan    Acute hypoxic respiratory failure:   Multifactorial.  Continues to make progress.  Wean O2 as possible   Atrial fib with RVR:    Continue PO amiodarone  load changing to 200 mg at discharge.   Start DOAC after cath.   Maintaining NSR.    NSTEMI:   Cath this afternoon.    HTN:    BP has been labile but tolerating beta blocker.  I am holding off on  trying to titrate meds further at this time.    ESRD: Per nephrology  Acute systolic HF:    Volume per dialysis.   Hypokalemia:  Will give additional potassium today.    For questions or updates, please contact [facility] Please consult [email or web address] for contact info under Cardiology/STEMI.   Signed, Rachel Schilling, MD  04/23/2023, 7:21 AM

## 2023-04-23 NOTE — Progress Notes (Signed)
 [facility] Progress Note  Subjective:  seen in room.  For LHC today after NSTEMI Stable VS on 1L [place] No c/o  Vitals:   04/22/23 1613 04/22/23 1946 04/22/23 2243 04/23/23 0530  BP: (!) 136/51 118/79 137/60 (!) 145/53  Pulse: 67 68 65 62  Resp: 16 19 18    Temp: 98.4 F (36.9 C) 98.1 F (36.7 C) 98.2 F (36.8 C) 98.2 F (36.8 C)  TempSrc: Oral Oral Oral Oral  SpO2: 100% 100% 100%   Weight:    60.5 kg  Height:        Exam: Gen alert, comfortable No jvd or bruits Chest clear bilat; good air mov't in bases RRR no MRG Abd soft ntnd no mass or ascites +bs Ext no LE edema  Neuro is alert, Ox 3 , nf    R AVF +bruit, recent wound healing c/d/I; RIJ TDC     OP HD: NW TTS 4h   400/1.5   2/2.5 bath  64 kg  Heparin  4000   Right TDC (revised RUE AVF)  - mircera 50 mcg q 2, last 1/02, due 1/16     Assessment/ Plan: Acute hypoxic resp failure - due to pna, NSTEMI, and vol excess. No home O2. Weights down ? 9kg, 4kg under EDW if accurate. Continues to improve from resp standpoint.  Flu A/ multifocal pna - by CXR and CT chest. Per pmd on doxy/zosyn  Afib / RVR - started amio 1/09 for afib w/ RVR. HR is much better w/ HR's 70-80s  ESRD - on HD TTS. Had HD here Tuesday, Thursday and Sat. Next HD 1/14. 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 Follow.  Secondary hyperparathyroidism - CCa in range. Cont renvela  as binder ac.  HD access - sp AVF superficialization RUE surgery 04/13/23, per [facility] should be ready 4 wks from 04/13/23.    [name], MD  04/23/2023, 8:09 AM  Recent Labs  Lab 04/17/23 0018 04/17/23 9371 04/18/23 0243 04/19/23 0226 04/22/23 0219 04/23/23 0223  HGB  --    < > 10.3*   < > 11.1* 10.3*  ALBUMIN  2.6*  --  2.9*  --   --   --   CALCIUM  8.6*  --  8.8*   < > 9.3 9.4  PHOS  --   --   --   --  3.1 4.1  CREATININE 6.77*   --  3.90*   < > 4.18* 6.41*  K 3.9  --  3.5   < > 3.2* 3.5   < > = values in this interval not displayed.   No results for input(s): IRON, TIBC, FERRITIN in the last 168 hours. Inpatient medications:  albuterol   5 mg Nebulization Once   [START ON 04/30/2023] amiodarone   200 mg Oral Daily   amiodarone   400 mg Oral BID   anastrozole   1 mg Oral Daily   arformoterol   15 mcg Nebulization BID   aspirin  EC  81 mg Oral Daily   atorvastatin   80 mg Oral q AM   bisacodyl   10 mg Rectal Q0600   budesonide  (PULMICORT ) nebulizer solution  0.5 mg Nebulization BID   Chlorhexidine  Gluconate Cloth  6 each Topical Q0600   Chlorhexidine  Gluconate Cloth  6 each Topical Q0600   doxycycline   100 mg Oral Q12H   febuxostat   40 mg Oral q AM   fluticasone   1 spray Each Nare Daily   metoprolol  succinate  100 mg Oral Daily   pantoprazole   40 mg Oral BID   polyethylene glycol  17 g Oral Daily   potassium chloride   40 mEq Oral Once   predniSONE   40 mg Oral Q breakfast   Followed by   [name] ON 04/25/2023] predniSONE   20 mg Oral Q breakfast   senna-docusate  2 tablet Oral BID   sevelamer  carbonate  800 mg Oral TID WC   traZODone   25 mg Oral QHS    sodium chloride  10 mL/hr at 04/23/23 0617   heparin  1,200 Units/hr (04/21/23 1354)   piperacillin -tazobactam (ZOSYN )  IV 2.25 g (04/23/23 0601)   acetaminophen  **OR** acetaminophen , diclofenac  Sodium, ondansetron  **OR** ondansetron  (ZOFRAN ) IV

## 2023-04-23 NOTE — Plan of Care (Signed)
  Problem: Education: Goal: Knowledge of General Education information will improve Description: Including pain rating scale, medication(s)/side effects and non-pharmacologic comfort measures Outcome: Progressing   Problem: Clinical Measurements: Goal: Ability to maintain clinical measurements within normal limits will improve Outcome: Progressing Goal: Respiratory complications will improve Outcome: Progressing Goal: Cardiovascular complication will be avoided Outcome: Progressing   Problem: Nutrition: Goal: Adequate nutrition will be maintained Outcome: Progressing   Problem: Elimination: Goal: Will not experience complications related to bowel motility Outcome: Progressing Goal: Will not experience complications related to urinary retention Outcome: Progressing   Problem: Pain Management: Goal: General experience of comfort will improve Outcome: Progressing

## 2023-04-23 NOTE — Progress Notes (Signed)
 PHARMACY - ANTICOAGULATION CONSULT NOTE  Pharmacy Consult for heparin  Indication: chest pain/ACS  Allergies  Allergen Reactions   Ace Inhibitors     angioedema   Lactose Intolerance (Gi) Other (See Comments)    GI upset   Letrozole  Hives   Lisinopril Swelling   Mercury     Red eyes, through eye drops that contained thimerosal    Tape Dermatitis    plastic    Patient Measurements: Height: 5' 2 (157.5 cm) Weight: 60.5 kg (133 lb 6.1 oz) IBW/kg (Calculated) : 50.1 Heparin  Dosing Weight: 63kg  Vital Signs: Temp: 98.2 F (36.8 C) (01/13 0530) Temp Source: Oral (01/13 0530) BP: 145/52 (01/13 0955) Pulse Rate: 64 (01/13 0955)  Labs: Recent Labs    04/21/23 0210 04/22/23 0219 04/23/23 0223  HGB 9.6* 11.1* 10.3*  HCT 29.2* 33.2* 31.6*  PLT 316 382 322  HEPARINUNFRC 0.44 0.49 0.48  CREATININE 5.44* 4.18* 6.41*    Estimated Creatinine Clearance: 6.1 mL/min (A) (by C-G formula based on SCr of 6.41 mg/dL (H)).   Assessment: 6 yof presented to the ED with SOB. Troponin elevated and with afib/RVR. Pharmacy consulted for heparin . Patient was switched to enoxaparin  treatment dose 1/8 and back to heparin  1/9. Current plans are for the patient to undergo LHC on 1/13 and will start OAC after the procedure.  Heparin  level therapeutic at 0.48 on 1200 units/hr.  CBC stable.   Goal of Therapy:  Heparin  level 0.3-0.7 units/ml Monitor platelets by anticoagulation protocol: Yes   Plan:   Continue heparin  gtt 1200 units/hr   Will follow plans post cath  Prentice Poisson, PharmD Clinical Pharmacist **Pharmacist phone directory can now be found on [email or web address] ([email or web address]).  Listed under [facility].

## 2023-04-23 NOTE — Progress Notes (Addendum)
 PROGRESS NOTE  Rachel Villarreal  FMW:[id number] DOB: 1945/07/24 DOA: 04/16/2023 PCP: Ransom Other, MD   Brief Narrative: Patient is a 78 year old female with history of hypertension, ESRD on dialysis, breast cancer who presented with shortness of breath.  Found to have multifocal pneumonia, influenza A infection.  Hospital course remarkable for an NSTEMI, A-fib with RVR.  Nephrology, cardiology following.  Plan for left heart cath. Palliaitve care following for goals of care  Assessment & Plan:  Principal Problem:   Non-ST elevated myocardial infarction [facility]) Active Problems:   Influenza A with pneumonia   Paroxysmal atrial fibrillation with RVR (HCC)   ESRD (end stage renal disease) (HCC)   Essential hypertension   NSTEMI: Currently chest pain-free.  Cardiology following.  Currently on IV heparin , aspirin , statin.  Plan for left heart cath.  Acute hypoxic respiratory failure/multifocal pneumonia likely from aspiration/influenza A: Presented with shortness of breath.  Found to have multifocal pneumonia, influenza A positive.  Fluid overload component also contributing.  On Zosyn , doxycycline .  Currently on 2Lof oxygen per minute.  Not on oxygen at baseline.  Continue to wean.  PCCM was following.  Speech therapy recommended regular diet.  On a steroid taper for asthma exacerbation.  Has mild leukocytosis, likely from steroids.  Paroxysmal A-fib with RVR: Currently in normal sinus rhythm.  On amiodarone .  Plan for 400 mg twice daily for 10 days followed by 200 mg daily.  On IV heparin .  Plan to change to DOAC after cath  ESRD: Nephrology following.  On TTS schedule.  Hypertension: Currently well-controlled.  On hydralazine   Hyperlipidemia: On Lipitor   Breast cancer: ER, PR positive.  On anastrozole   Chronic normocytic anemia: Hemoglobin stable.  No evidence of acute blood loss  GERD: Continue PPI  Constipation: Continue bowel regimen  Debility/deconditioning: Consulted PT/OT, SNF  recommended.  TOC consulted  Goals of care: 78 year old female with multiple comorbidities.  Palliative care consulted for goals of care.  Current plan is to continue full scope of treatment         DVT prophylaxis:iv heparin      Code Status: Full Code  Family Communication: None at the bedside  Patient status:Inpatient  Patient is from :home  Anticipated discharge to: SNF  Estimated DC date: 1 to 2 days   Consultants: Cardiology, nephrology, palliative care, PCCM  Procedures: Dialysis  Antimicrobials:  Anti-infectives (From admission, onward)    Start     Dose/Rate Route Frequency Ordered Stop   04/19/23 1800  oseltamivir  (TAMIFLU ) capsule 30 mg        30 mg Oral Every T-Th-Sa (1800) 04/18/23 0947 04/20/23 0629   04/19/23 1130  doxycycline  (VIBRA -TABS) tablet 100 mg        100 mg Oral Every 12 hours 04/19/23 1043 04/24/23 0959   04/19/23 1030  piperacillin -tazobactam (ZOSYN ) IVPB 2.25 g        2.25 g 100 mL/hr over 30 Minutes Intravenous Every 8 hours 04/19/23 0949     04/17/23 2300  oseltamivir  (TAMIFLU ) capsule 30 mg        30 mg Oral Every T-Th-Sa (1800) 04/17/23 2101 04/18/23 0213   04/17/23 2200  oseltamivir  (TAMIFLU ) capsule 30 mg  Status:  Discontinued        30 mg Oral Daily at bedtime 04/17/23 0858 04/17/23 2101   04/17/23 1300  cefTRIAXone  (ROCEPHIN ) 2 g in sodium chloride  0.9 % 100 mL IVPB  Status:  Discontinued        2 g 200 mL/hr over 30 Minutes  Intravenous Every 24 hours 04/17/23 1212 04/19/23 0914   04/17/23 1300  azithromycin  (ZITHROMAX ) tablet 500 mg  Status:  Discontinued        500 mg Oral Daily 04/17/23 1212 04/19/23 0914   04/17/23 1000  oseltamivir  (TAMIFLU ) capsule 30 mg  Status:  Discontinued        30 mg Oral Daily 04/16/23 2308 04/17/23 0858   04/16/23 1845  cefTRIAXone  (ROCEPHIN ) 1 g in sodium chloride  0.9 % 100 mL IVPB        1 g 200 mL/hr over 30 Minutes Intravenous  Once 04/16/23 1844 04/16/23 1930   04/16/23 1845  azithromycin   (ZITHROMAX ) 500 mg in sodium chloride  0.9 % 250 mL IVPB        500 mg 250 mL/hr over 60 Minutes Intravenous  Once 04/16/23 1844 04/16/23 2037   04/16/23 1545  oseltamivir  (TAMIFLU ) capsule 30 mg        30 mg Oral  Once 04/16/23 1543 04/16/23 1618       Subjective: Patient seen and examined at bedside today.  Hemodynamically stable.  Overall comfortable.  Lying in bed.  Denies any worsening shortness of breath of chest pain or cough.  I weaned her to room air  Objective: Vitals:   04/22/23 1613 04/22/23 1946 04/22/23 2243 04/23/23 0530  BP: (!) 136/51 118/79 137/60 (!) 145/53  Pulse: 67 68 65 62  Resp: 16 19 18    Temp: 98.4 F (36.9 C) 98.1 F (36.7 C) 98.2 F (36.8 C) 98.2 F (36.8 C)  TempSrc: Oral Oral Oral Oral  SpO2: 100% 100% 100%   Weight:    60.5 kg  Height:        Intake/Output Summary (Last 24 hours) at 04/23/2023 0751 Last data filed at 04/22/2023 1700 Gross per 24 hour  Intake 360 ml  Output --  Net 360 ml   Filed Weights   04/21/23 0817 04/21/23 1230 04/23/23 0530  Weight: 69 kg 65.7 kg 60.5 kg    Examination:  General exam: Overall comfortable, not in distress, pleasant elderly female HEENT: PERRL Respiratory system:  no wheezes or crackles  Cardiovascular system: S1 & S2 heard, RRR.  Gastrointestinal system: Abdomen is nondistended, soft and nontender. Central nervous system: Alert and oriented Extremities: No edema, no clubbing ,no cyanosis Skin: No rashes, no ulcers,no icterus     Data Reviewed: I have personally reviewed following labs and imaging studies  CBC: Recent Labs  Lab 04/16/23 1230 04/17/23 0628 04/19/23 0226 04/20/23 0926 04/21/23 0210 04/22/23 0219 04/23/23 0223  WBC 7.8   < > 13.5* 12.0* 14.2* 12.5* 13.8*  NEUTROABS 6.1  --   --   --   --   --   --   HGB 9.5*   < > 11.3* 11.0* 9.6* 11.1* 10.3*  HCT 28.4*   < > 33.4* 33.6* 29.2* 33.2* 31.6*  MCV 95.3   < > 93.0 95.5 95.4 93.3 95.5  PLT 182   < > 307 329 316 382 322   <  > = values in this interval not displayed.   Basic Metabolic Panel: Recent Labs  Lab 04/17/23 0018 04/18/23 0243 04/19/23 1309 04/20/23 0926 04/21/23 0210 04/22/23 0219 04/23/23 0223  NA 134*   < > 135 135 133* 140 138  K 3.9   < > 4.3 4.5 3.9 3.2* 3.5  CL 95*   < > 96* 92* 96* 99 98  CO2 19*   < > 25 27 20* 26 21*  GLUCOSE 81   < > 185* 113* 103* 106* 126*  BUN 49*   < > 46* 31* 51* 36* 60*  CREATININE 6.77*   < > 6.45* 4.18* 5.44* 4.18* 6.41*  CALCIUM  8.6*   < > 9.3 9.6 9.0 9.3 9.4  MG 2.1  --  2.3  --   --  2.1 1.9  PHOS  --   --   --   --   --  3.1 4.1   < > = values in this interval not displayed.     Recent Results (from the past 240 hours)  Resp panel by RT-PCR (RSV, Flu A&B, Covid) Anterior Nasal Swab     Status: Abnormal   Collection Time: 04/16/23 12:30 PM   Specimen: Anterior Nasal Swab  Result Value Ref Range Status   SARS Coronavirus 2 by RT PCR NEGATIVE NEGATIVE Final    Comment: (NOTE) SARS-CoV-2 target nucleic acids are NOT DETECTED.  The SARS-CoV-2 RNA is generally detectable in upper respiratory specimens during the acute phase of infection. The lowest concentration of SARS-CoV-2 viral copies this assay can detect is 138 copies/mL. A negative result does not preclude SARS-Cov-2 infection and should not be used as the sole basis for treatment or other patient management decisions. A negative result may occur with  improper specimen collection/handling, submission of specimen other than nasopharyngeal swab, presence of viral mutation(s) within the areas targeted by this assay, and inadequate number of viral copies(<138 copies/mL). A negative result must be combined with clinical observations, patient history, and epidemiological information. The expected result is Negative.  Fact Sheet for Patients:  bloggercourse.com  Fact Sheet for Healthcare Providers:  seriousbroker.it  This test is no t yet  approved or cleared by the [place]  FDA and  has been authorized for detection and/or diagnosis of SARS-CoV-2 by FDA under an Emergency Use Authorization (EUA). This EUA will remain  in effect (meaning this test can be used) for the duration of the COVID-19 declaration under Section 564(b)(1) of the Act, 21 U.S.C.section 360bbb-3(b)(1), unless the authorization is terminated  or revoked sooner.       Influenza A by PCR POSITIVE (A) NEGATIVE Final   Influenza B by PCR NEGATIVE NEGATIVE Final    Comment: (NOTE) The Xpert Xpress SARS-CoV-2/FLU/RSV plus assay is intended as an aid in the diagnosis of influenza from Nasopharyngeal swab specimens and should not be used as a sole basis for treatment. Nasal washings and aspirates are unacceptable for Xpert Xpress SARS-CoV-2/FLU/RSV testing.  Fact Sheet for Patients: bloggercourse.com  Fact Sheet for Healthcare Providers: seriousbroker.it  This test is not yet approved or cleared by the [place]  FDA and has been authorized for detection and/or diagnosis of SARS-CoV-2 by FDA under an Emergency Use Authorization (EUA). This EUA will remain in effect (meaning this test can be used) for the duration of the COVID-19 declaration under Section 564(b)(1) of the Act, 21 U.S.C. section 360bbb-3(b)(1), unless the authorization is terminated or revoked.     Resp Syncytial Virus by PCR NEGATIVE NEGATIVE Final    Comment: (NOTE) Fact Sheet for Patients: bloggercourse.com  Fact Sheet for Healthcare Providers: seriousbroker.it  This test is not yet approved or cleared by the [place]  FDA and has been authorized for detection and/or diagnosis of SARS-CoV-2 by FDA under an Emergency Use Authorization (EUA). This EUA will remain in effect (meaning this test can be used) for the duration of the COVID-19 declaration under Section 564(b)(1)  of the Act, 21 U.S.C.  section 360bbb-3(b)(1), unless the authorization is terminated or revoked.  Performed at [facility], [address], [place], [place] [address]   MRSA Next Gen by PCR, Nasal     Status: Abnormal   Collection Time: 04/16/23 10:43 PM   Specimen: Nasal Mucosa; Nasal Swab  Result Value Ref Range Status   MRSA by PCR Next Gen DETECTED (A) NOT DETECTED Final    Comment: RESULT CALLED TO, READ BACK BY AND VERIFIED WITH: TONYA,RN@0049  04/17/23 MK (NOTE) The GeneXpert MRSA Assay (FDA approved for NASAL specimens only), is one component of a comprehensive MRSA colonization surveillance program. It is not intended to diagnose MRSA infection nor to guide or monitor treatment for MRSA infections. Test performance is not FDA approved in patients less than 69 years old. Performed at [facility], [address] [place], [place] [address]      Radiology Studies: No results found.  Scheduled Meds:  albuterol   5 mg Nebulization Once   [START ON 04/30/2023] amiodarone   200 mg Oral Daily   amiodarone   400 mg Oral BID   anastrozole   1 mg Oral Daily   arformoterol   15 mcg Nebulization BID   aspirin  EC  81 mg Oral Daily   atorvastatin   80 mg Oral q AM   bisacodyl   10 mg Rectal Q0600   budesonide  (PULMICORT ) nebulizer solution  0.5 mg Nebulization BID   Chlorhexidine  Gluconate Cloth  6 each Topical Q0600   Chlorhexidine  Gluconate Cloth  6 each Topical Q0600   doxycycline   100 mg Oral Q12H   febuxostat   40 mg Oral q AM   fluticasone   1 spray Each Nare Daily   metoprolol  succinate  100 mg Oral Daily   pantoprazole   40 mg Oral BID   polyethylene glycol  17 g Oral Daily   potassium chloride   40 mEq Oral Once   predniSONE   40 mg Oral Q breakfast   Followed by   [name] ON 04/25/2023] predniSONE   20 mg Oral Q breakfast   senna-docusate  2 tablet Oral BID   sevelamer  carbonate  800 mg Oral TID WC   traZODone   25 mg Oral QHS   Continuous  Infusions:  sodium chloride  10 mL/hr at 04/23/23 0617   heparin  1,200 Units/hr (04/21/23 1354)   piperacillin -tazobactam (ZOSYN )  IV 2.25 g (04/23/23 0601)     LOS: 7 days   Ivonne Mustache, MD [facility] P1/13/2025, 7:51 AM

## 2023-04-23 NOTE — Interval H&P Note (Signed)
 History and Physical Interval Note:  04/23/2023 12:33 PM  Rachel Villarreal  has presented today for surgery, with the diagnosis of NSTEMI.  The various methods of treatment have been discussed with the patient and family. After consideration of risks, benefits and other options for treatment, the patient has consented to  Procedure(s): RIGHT/LEFT HEART CATH AND CORONARY ANGIOGRAPHY (N/A) as a surgical intervention.  The patient's history has been reviewed, patient examined, no change in status, stable for surgery.  I have reviewed the patient's chart and labs.  Questions were answered to the patient's satisfaction.    Cath Lab Visit (complete for each Cath Lab visit)  Clinical Evaluation Leading to the Procedure:   ACS: Yes.    Non-ACS:    Anginal Classification: CCS II  Anti-ischemic medical therapy: Maximal Therapy (2 or more classes of medications)  Non-Invasive Test Results: No non-invasive testing performed  Prior CABG: No previous CABG       [facility] 04/23/2023 12:33 PM

## 2023-04-24 ENCOUNTER — Encounter (HOSPITAL_COMMUNITY): Payer: Self-pay | Admitting: Cardiology

## 2023-04-24 DIAGNOSIS — I214 Non-ST elevation (NSTEMI) myocardial infarction: Secondary | ICD-10-CM | POA: Diagnosis not present

## 2023-04-24 DIAGNOSIS — E440 Moderate protein-calorie malnutrition: Secondary | ICD-10-CM | POA: Insufficient documentation

## 2023-04-24 LAB — CBC
HCT: 31.9 % — ABNORMAL LOW (ref 36.0–46.0)
Hemoglobin: 10.7 g/dL — ABNORMAL LOW (ref 12.0–15.0)
MCH: 31.4 pg (ref 26.0–34.0)
MCHC: 33.5 g/dL (ref 30.0–36.0)
MCV: 93.5 fL (ref 80.0–100.0)
Platelets: 313 10*3/uL (ref 150–400)
RBC: 3.41 MIL/uL — ABNORMAL LOW (ref 3.87–5.11)
RDW: 13.7 % (ref 11.5–15.5)
WBC: 15.9 10*3/uL — ABNORMAL HIGH (ref 4.0–10.5)
nRBC: 0 % (ref 0.0–0.2)

## 2023-04-24 LAB — BASIC METABOLIC PANEL
Anion gap: 14 (ref 5–15)
BUN: 76 mg/dL — ABNORMAL HIGH (ref 8–23)
CO2: 23 mmol/L (ref 22–32)
Calcium: 9.7 mg/dL (ref 8.9–10.3)
Chloride: 102 mmol/L (ref 98–111)
Creatinine, Ser: 8.35 mg/dL — ABNORMAL HIGH (ref 0.44–1.00)
GFR, Estimated: 4 mL/min — ABNORMAL LOW (ref >60)
Glucose, Bld: 141 mg/dL — ABNORMAL HIGH (ref 70–99)
Potassium: 3.9 mmol/L (ref 3.5–5.1)
Sodium: 139 mmol/L (ref 135–145)

## 2023-04-24 MED ORDER — ROPINIROLE HCL 0.5 MG PO TABS
0.5000 mg | ORAL_TABLET | Freq: Two times a day (BID) | ORAL | Status: DC | PRN
  Administered 2023-04-25: 0.5 mg via ORAL
  Filled 2023-04-24 (×2): qty 1

## 2023-04-24 MED ORDER — HEPARIN SODIUM (PORCINE) 1000 UNIT/ML IJ SOLN
3200.0000 [IU] | Freq: Once | INTRAMUSCULAR | Status: AC
  Administered 2023-04-24: 3200 [IU]

## 2023-04-24 MED ORDER — HEPARIN SODIUM (PORCINE) 1000 UNIT/ML IJ SOLN
INTRAMUSCULAR | Status: AC
  Filled 2023-04-24: qty 4

## 2023-04-24 NOTE — TOC Progression Note (Addendum)
 Transition of Care [facility]) - Progression Note    Patient Details  Name: Rachel Villarreal MRN: [id number] Date of Birth: April 08, 1946  Transition of [facility]) CM/SW Contact  Lauraine FORBES Saa, LCSW Phone Number: 04/24/2023, 10:59 AM  Clinical Narrative:     10:59 AM CSW provided SNF options and Medicare ratings to patient at bedside [facility], [facility] w/ changed dialysis time, [facility], [facility]). Patient confirmed that a SNF decision would be made today.  12:13 PM CSW returned to patient's bedside where patient informed CSW of SNF decision [facility]). CSW followed up on transportation for discharge and explained ambulance transportation process (may be a copay that would be billed upon service). Patient expressed understanding of the following information and interest in ambulance transportation for discharge.  3:12 PM Insurance authorization (ID [id number]) is currently pending.  5:03 PM CSW received notification that patient's insurance authorization for SNF has been approved and expires 04/27/2023. Patient is able to discharge to SNF once medically stable.  Expected Discharge Plan: Skilled Nursing Facility Barriers to Discharge: Continued Medical Work up, SNF Pending bed offer  Expected Discharge Plan and Services In-house Referral: Clinical Social Work   Post Acute Care Choice: Skilled Nursing Facility Living arrangements for the past 2 months: Single Family Home                                       Social Determinants of Health (SDOH) Interventions SDOH Screenings   Food Insecurity: No Food Insecurity (04/16/2023)  Housing: Low Risk  (04/16/2023)  Transportation Needs: No Transportation Needs (04/16/2023)  Utilities: Not At Risk (04/16/2023)  Depression (PHQ2-9): Low Risk  (02/28/2022)  Financial Resource Strain: Low Risk  (09/02/2021)  Social Connections: Moderately Integrated (04/16/2023)  Stress: No Stress Concern Present (09/02/2021)  Tobacco Use:  Medium Risk (04/16/2023)    Readmission Risk Interventions     No data to display

## 2023-04-24 NOTE — Progress Notes (Signed)
   04/24/23 1639  Vitals  Pulse Rate (!) 58  Resp 19  BP (!) 110/42  SpO2 99 %  O2 Device Nasal Cannula  Weight 67.5 kg (via bed.)  Type of Weight Post-Dialysis  Oxygen Therapy  O2 Flow Rate (L/min) 2 L/min  Patient Activity (if Appropriate) In bed  Pulse Oximetry Type Continuous  Oximetry Probe Site Changed No  Post Treatment  Dialyzer Clearance Clear  Liters Processed 65.1  Fluid Removed (mL) 1500 mL  Tolerated HD Treatment Yes   Received patient in bed to unit.  Alert and oriented.  Informed consent signed and in chart.   TX duration:3  Patient tolerated well.  Transported back to the room  Alert, without acute distress.  Hand-off given to patient's nurse.   Access used: Yes Access issues: No  Total UF removed: 1500 Medication(s) given: See Mar Post HD VS: See Above Grid Post HD weight: 67.5 kg   [facility]LE[facility]

## 2023-04-24 NOTE — Progress Notes (Signed)
 Physical Therapy Treatment Patient Details Name: Rachel Villarreal MRN: [id number] DOB: [birth year] Today's Date: 04/24/2023   History of Present Illness Pt is 78 yo presenting to [facility] ED with shortness of breathe. Pt was found to have multifocal pneumonia with influenza A. Pt then had evidence of NSTEMI with a-fib and RVR. Cath planned for 04/22/22. PMH: HTN, ESRD on HD, breast cancer.    PT Comments  Pt received in bed having just finished lunch. Focused session today on ambulation and balance. Pt declined use of RW but does need UE support to prevent falling. Given HHA today and pt reports being agreeable to trying cane next session. Pt with posterior bias in sitting and standing but self corrected LOB today. Worked on controlled sitting as pt has difficulty with last 25% of sit and flops onto bed. Improved with practice. VSS on RA with SPO2 in low 90's. Patient will benefit from continued inpatient follow up therapy, <3 hours/day. PT will continue to follow.     If plan is discharge home, recommend the following: Assistance with cooking/housework;Assist for transportation;Help with stairs or ramp for entrance;A little help with walking and/or transfers   Can travel by private vehicle     No  Equipment Recommendations  Rolling walker (2 wheels);BSC/3in1    Recommendations for Other Services       Precautions / Restrictions Precautions Precautions: Fall Precaution Comments: droplet Restrictions Weight Bearing Restrictions Per Provider Order: No     Mobility  Bed Mobility Overal bed mobility: Needs Assistance Bed Mobility: Supine to Sit, Sit to Supine     Supine to sit: Contact guard Sit to supine: Min assist   General bed mobility comments: CGA for trunk with HOB up, initial posterior LOB. MIn A for LE for sitting to supine due to fatigue.    Transfers Overall transfer level: Needs assistance Equipment used: 1 person hand held assist Transfers: Sit to/from Stand Sit to  Stand: Min assist           General transfer comment: min A to steady and min A to control descent with sitting. Worked on this 4 more times to better control descent and increase safety    Ambulation/Gait Ambulation/Gait assistance: Editor, Commissioning (Feet): 40 Feet Assistive device: 1 person hand held assist Gait Pattern/deviations: Step-through pattern, Step-to pattern, Decreased step length - right, Decreased step length - left, Narrow base of support, Trunk flexed Gait velocity: decreased Gait velocity interpretation: <1.31 ft/sec, indicative of household ambulator   General Gait Details: pt declined use of RW. Short step length with very low step height. Worked on increasing step length safely. Practiced navigating obstacles and changing directions. Pt very much needs Min A to prevent fall   Stairs             Wheelchair Mobility     Tilt Bed    Modified Rankin (Stroke Patients Only)       Balance Overall balance assessment: Needs assistance Sitting-balance support: Single extremity supported, Feet supported, Feet unsupported Sitting balance-Leahy Scale: Fair Sitting balance - Comments: occasional posterior LOB with dynamic exercises, self correction Postural control: Posterior lean Standing balance support: During functional activity, Single extremity supported Standing balance-Leahy Scale: Poor Standing balance comment: needs UE support even in static stance                            Cognition Arousal: Alert Behavior During Therapy: WFL for tasks assessed/performed Overall Cognitive  Status: Within Functional Limits for tasks assessed                                 General Comments: some decreased attention to safety and verbalizes that she is unsteady but declines using RW. However she was more agreeable to trying cane so could train with that next session.        Exercises      General Comments General comments  (skin integrity, edema, etc.): SPO2 low 90's but waveform not good. Pt with mild SOB after ambulation      Pertinent Vitals/Pain Pain Assessment Pain Assessment: No/denies pain    Home Living                          Prior Function            PT Goals (current goals can now be found in the care plan section) Acute Rehab PT Goals Patient Stated Goal: to get stronger and go home safely PT Goal Formulation: With patient Time For Goal Achievement: 05/05/23 Potential to Achieve Goals: Good Progress towards PT goals: Progressing toward goals    Frequency    Min 1X/week      PT Plan      Co-evaluation              AM-PAC PT 6 Clicks Mobility   Outcome Measure  Help needed turning from your back to your side while in a flat bed without using bedrails?: A Little Help needed moving from lying on your back to sitting on the side of a flat bed without using bedrails?: A Little Help needed moving to and from a bed to a chair (including a wheelchair)?: A Little Help needed standing up from a chair using your arms (e.g., wheelchair or bedside chair)?: A Little Help needed to walk in hospital room?: A Lot Help needed climbing 3-5 steps with a railing? : A Lot 6 Click Score: 16    End of Session Equipment Utilized During Treatment: Gait belt Activity Tolerance: Patient tolerated treatment well Patient left: in bed;with call bell/phone within reach Nurse Communication: Mobility status PT Visit Diagnosis: Unsteadiness on feet (R26.81);Other abnormalities of gait and mobility (R26.89);Muscle weakness (generalized) (M62.81)     Time: 8778-8757 PT Time Calculation (min) (ACUTE ONLY): 21 min  Charges:    $Gait Training: 8-22 mins PT General Charges $$ ACUTE PT VISIT: 1 Visit                     Richerd Lipoma, PT  Acute Rehab Services Secure chat preferred Office [phone]    Richerd CROME [name] 04/24/2023, 2:21 PM

## 2023-04-24 NOTE — Discharge Instructions (Signed)

## 2023-04-24 NOTE — Progress Notes (Signed)
 Progress Note  Patient Name: Rachel Villarreal Date of Encounter: 04/24/2023  Primary Cardiologist:   None   Subjective   She denies chest pain or SOB.  Currently oxygenating well on RA  Inpatient Medications    Scheduled Meds:  albuterol   5 mg Nebulization Once   [START ON 04/30/2023] amiodarone   200 mg Oral Daily   amiodarone   400 mg Oral BID   anastrozole   1 mg Oral Daily   apixaban   5 mg Oral BID   arformoterol   15 mcg Nebulization BID   aspirin  EC  81 mg Oral Daily   atorvastatin   80 mg Oral q AM   bisacodyl   10 mg Rectal Q0600   budesonide  (PULMICORT ) nebulizer solution  0.5 mg Nebulization BID   Chlorhexidine  Gluconate Cloth  6 each Topical Q0600   Chlorhexidine  Gluconate Cloth  6 each Topical Q0600   febuxostat   40 mg Oral q AM   feeding supplement  1 Container Oral TID BM   fluticasone   1 spray Each Nare Daily   metoprolol  succinate  100 mg Oral Daily   multivitamin  1 tablet Oral QHS   pantoprazole   40 mg Oral BID   polyethylene glycol  17 g Oral Daily   [START ON 04/25/2023] predniSONE   20 mg Oral Q breakfast   senna-docusate  2 tablet Oral BID   sevelamer  carbonate  800 mg Oral TID WC   sodium chloride  flush  3 mL Intravenous Q12H   traZODone   25 mg Oral QHS   Continuous Infusions:  sodium chloride      PRN Meds: sodium chloride , acetaminophen  **OR** acetaminophen , diclofenac  Sodium, ondansetron  **OR** ondansetron  (ZOFRAN ) IV, sodium chloride  flush   Vital Signs    Vitals:   04/23/23 1927 04/23/23 2038 04/23/23 2322 04/24/23 0400  BP: (!) 136/51  (!) 123/54 118/88  Pulse: 62 68 (!) 58 (!) 56  Resp: 20 17 16 16   Temp: 98.2 F (36.8 C)  97.8 F (36.6 C) 97.8 F (36.6 C)  TempSrc: Oral  Oral Oral  SpO2: 97% 95% 96% 98%  Weight:      Height:        Intake/Output Summary (Last 24 hours) at 04/24/2023 0732 Last data filed at 04/24/2023 0400 Gross per 24 hour  Intake 591.8 ml  Output --  Net 591.8 ml   Filed Weights   04/21/23 0817 04/21/23  1230 04/23/23 0530  Weight: 69 kg 65.7 kg 60.5 kg    Telemetry    NSR - Personally Reviewed  ECG    NA - Personally Reviewed  Physical Exam   GEN: No  acute distress.   Neck: No  JVD Cardiac: RRR, no murmurs, rubs, or gallops.  Respiratory: Clear   to auscultation bilaterally. GI: Soft, nontender, non-distended, normal bowel sounds  MS:  No edema; No deformity.  Right femoral access site without bleeding or brusing.  Neuro:   Nonfocal  Psych: Oriented and appropriate     Labs    Chemistry Recent Labs  Lab 04/18/23 0243 04/19/23 1309 04/22/23 0219 04/23/23 0223 04/23/23 1256 04/23/23 1300 04/24/23 0216  NA 136   < > 140 138 136  135 134* 139  K 3.5   < > 3.2* 3.5 3.8  3.7 3.7 3.9  CL 96*   < > 99 98  --   --  102  CO2 26   < > 26 21*  --   --  23  GLUCOSE 105*   < >  106* 126*  --   --  141*  BUN 23   < > 36* 60*  --   --  76*  CREATININE 3.90*   < > 4.18* 6.41*  --   --  8.35*  CALCIUM  8.8*   < > 9.3 9.4  --   --  9.7  PROT 6.2*  --   --   --   --   --   --   ALBUMIN  2.9*  --   --   --   --   --   --   AST 33  --   --   --   --   --   --   ALT 7  --   --   --   --   --   --   ALKPHOS 81  --   --   --   --   --   --   BILITOT 1.0  --   --   --   --   --   --   GFRNONAA 11*   < > 10* 6*  --   --  4*  ANIONGAP 14   < > 15 19*  --   --  14   < > = values in this interval not displayed.     Hematology Recent Labs  Lab 04/22/23 0219 04/23/23 0223 04/23/23 1256 04/23/23 1300 04/24/23 0216  WBC 12.5* 13.8*  --   --  15.9*  RBC 3.56* 3.31*  --   --  3.41*  HGB 11.1* 10.3* 10.5*  10.5* 10.2* 10.7*  HCT 33.2* 31.6* 31.0*  31.0* 30.0* 31.9*  MCV 93.3 95.5  --   --  93.5  MCH 31.2 31.1  --   --  31.4  MCHC 33.4 32.6  --   --  33.5  RDW 13.4 13.5  --   --  13.7  PLT 382 322  --   --  313    Cardiac EnzymesNo results for input(s): TROPONINI in the last 168 hours. No results for input(s): TROPIPOC in the last 168 hours.   BNP No results for  input(s): BNP, PROBNP in the last 168 hours.    DDimer No results for input(s): DDIMER in the last 168 hours.   Radiology    CARDIAC CATHETERIZATION Result Date: 04/23/2023   Prox LAD to Mid LAD lesion is 30% stenosed.   Mid Cx lesion is 85% stenosed.   Prox RCA to Dist RCA lesion is 20% stenosed.   LV end diastolic pressure is normal. 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 LVEDP 9 mm Hg, PCWP 6/4 mean 5 mm Hg Normal right heart pressures. PAP 30/7 mean 17 mm Hg Normal cardiac output 5.11 L/min, index 3.06 Plan: patient has no active angina. While the LCx could be treated with PCI I think the appropriate thing to do is treat her medically. She is at higher bleeding risk and needs DOAC for Afib. If she has refractory angina on medical therapy then LCx PCI could be considered.     Cardiac Studies   Echo:   1. Left ventricular ejection fraction, by estimation, is 35 to 40%. The  left ventricle has moderately decreased function. The left ventricle  demonstrates global hypokinesis. There is mild concentric left ventricular  hypertrophy. Left ventricular  diastolic parameters are indeterminate. There is the interventricular  septum is flattened in systole and diastole, consistent with right  ventricular pressure  and volume overload.   2. Right ventricular systolic function is moderately reduced. The right  ventricular size is moderately enlarged. There is mildly elevated  pulmonary artery systolic pressure. The estimated right ventricular  systolic pressure is 44.0 mmHg.   3. Left atrial size was severely dilated.   4. Right atrial size was moderately dilated.   5. The mitral valve is grossly normal. Mild mitral valve regurgitation.  No evidence of mitral stenosis.   6. Tricuspid valve regurgitation is mild to moderate.   7. The aortic valve is tricuspid. Aortic valve regurgitation is not  visualized. No aortic stenosis is present.   8. The  inferior vena cava is dilated in size with >50% respiratory  variability, suggesting right atrial pressure of 8 mmHg.   Cardiac cath:  Diagnostic Dominance: Right   Patient Profile     78 y.o. female with a hx of ESRD on IHD, HTN, HLD, prior breast CA, gout, and GERD who is being seen 04/16/2023 for the evaluation of elevated troponins at the request of Dr. Jerrol.   Assessment & Plan    Acute hypoxic respiratory failure:   Multifactorial.  Improved.  Plan per primary team.   Atrial fib with RVR:    Continue PO amiodarone  load changing to 200 mg at discharge.   Started on DOAC after cath.   Maintaining NSR.    Atrial fib might have been secondary to the primary pulmonary process so likely will not be long term on amio.    NSTEMI:    Anatomy as above.  Films reviewed and results discussed with the patient.  Plan medical management. Continue beta blocker. Continue ASA and Eliquis  for now given atrial fib and NSTEMI.      HTN:    BP is controlled.    ESRD: Per nephrology.  Dialysis today.    Acute systolic HF:    Euvolemic.   Labile BP is precluding med titration.     For questions or updates, please contact [facility] Please consult [email or web address] for contact info under Cardiology/STEMI.   Signed, Lynwood Schilling, MD  04/24/2023, 7:32 AM

## 2023-04-24 NOTE — Progress Notes (Signed)
 [facility] Progress Note  Subjective:  seen in room.  LHC yesterday 1V disease plan med mgmt ? For CIR Stable VS on RA No c/o  Vitals:   04/23/23 2038 04/23/23 2322 04/24/23 0400 04/24/23 0756  BP:  (!) 123/54 118/88 (!) 154/56  Pulse: 68 (!) 58 (!) 56 65  Resp: 17 16 16 17   Temp:  97.8 F (36.6 C) 97.8 F (36.6 C) 97.9 F (36.6 C)  TempSrc:  Oral Oral Oral  SpO2: 95% 96% 98% 97%  Weight:      Height:        Exam: Gen alert, comfortable No jvd or bruits Chest clear bilat; good air mov't in bases RRR no MRG Abd soft ntnd no mass or ascites +bs Ext no LE edema  Neuro is alert, Ox 3 , nf    R AVF +bruit, recent wound healing c/d/I; RIJ TDC     OP HD: [facility] TTS 4h   400/1.5   2/2.5 bath  64 kg  Heparin  4000   Right TDC (revised RUE AVF)  - mircera 50 mcg q 2, last 1/02, due 1/16     Assessment/ Plan: Acute hypoxic resp failure - due to pna, NSTEMI, and vol excess. No home O2. Weights down ? 9kg, 4kg under EDW if accurate. Continues to improve from resp standpoint, now on RA Flu A/ multifocal pna - by CXR and CT chest. Per pmd  Afib / RVR -amio + DOAC per cariology ESRD - on HD TTS. Next HD 1/14. 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 Follow.  Secondary hyperparathyroidism - CCa in range. Cont renvela  as binder ac.  HD access - sp AVF superficialization RUE surgery 04/13/23, per [facility] should be ready 4 wks from 04/13/23. TDC currently   Rachel KATHEE Gasman, MD  04/24/2023, 10:01 AM  Recent Labs  Lab 04/18/23 0243 04/19/23 0226 04/22/23 0219 04/23/23 0223 04/23/23 1256 04/23/23 1300 04/24/23 0216  HGB 10.3*   < > 11.1* 10.3*   < > 10.2* 10.7*  ALBUMIN  2.9*  --   --   --   --   --   --   CALCIUM  8.8*   < > 9.3 9.4  --   --  9.7  PHOS  --   --  3.1 4.1  --   --   --   CREATININE 3.90*   < > 4.18* 6.41*  --   --  8.35*  K 3.5   < > 3.2* 3.5   < > 3.7 3.9   <  > = values in this interval not displayed.   No results for input(s): IRON, TIBC, FERRITIN in the last 168 hours. Inpatient medications:  albuterol   5 mg Nebulization Once   [START ON 04/30/2023] amiodarone   200 mg Oral Daily   amiodarone   400 mg Oral BID   anastrozole   1 mg Oral Daily   apixaban   5 mg Oral BID   arformoterol   15 mcg Nebulization BID   aspirin  EC  81 mg Oral Daily   atorvastatin   80 mg Oral q AM   bisacodyl   10 mg Rectal Q0600   budesonide  (PULMICORT ) nebulizer solution  0.5 mg Nebulization BID   Chlorhexidine  Gluconate Cloth  6 each Topical Q0600   Chlorhexidine  Gluconate Cloth  6 each Topical Q0600   febuxostat   40 mg Oral  q AM   feeding supplement  1 Container Oral TID BM   fluticasone   1 spray Each Nare Daily   metoprolol  succinate  100 mg Oral Daily   multivitamin  1 tablet Oral QHS   pantoprazole   40 mg Oral BID   polyethylene glycol  17 g Oral Daily   [START ON 04/25/2023] predniSONE   20 mg Oral Q breakfast   senna-docusate  2 tablet Oral BID   sevelamer  carbonate  800 mg Oral TID WC   sodium chloride  flush  3 mL Intravenous Q12H   traZODone   25 mg Oral QHS    sodium chloride      sodium chloride , acetaminophen  **OR** acetaminophen , diclofenac  Sodium, ondansetron  **OR** ondansetron  (ZOFRAN ) IV, rOPINIRole , sodium chloride  flush

## 2023-04-24 NOTE — Progress Notes (Signed)
 Pt's snf placement discussed with CSW. Will assist as needed.   Olivia Canter Renal Navigator [phone]

## 2023-04-24 NOTE — Progress Notes (Signed)
 PROGRESS NOTE  Rachel Villarreal  FMW:[id number] DOB: 12/14/1945 DOA: 04/16/2023 PCP: Ransom Other, MD   Brief Narrative: Patient is a 78 year old female with history of hypertension, ESRD on dialysis, breast cancer who presented with shortness of breath.  Found to have multifocal pneumonia, influenza A infection.  Hospital course remarkable for an NSTEMI, A-fib with RVR.  Nephrology, cardiology following.  S/P  left heart cath.  Lives alone.  PT/OT recommending SNF for discharge.  Waiting for skilled nursing facility bed  Assessment & Plan:  Principal Problem:   Non-ST elevated myocardial infarction [facility]) Active Problems:   Influenza A with pneumonia   Paroxysmal atrial fibrillation with RVR (HCC)   ESRD (end stage renal disease) (HCC)   Essential hypertension   Malnutrition of moderate degree   NSTEMI: Cardiology following.  Continue aspirin , statin, beta-blocker.  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  Recommended medical management.  No anginal symptoms now.  Acute hypoxic respiratory failure/multifocal pneumonia likely from aspiration/influenza A: Presented with shortness of breath.  Found to have multifocal pneumonia, influenza A positive.  Fluid overload component also contributing.  Completed antibiotics course.  Currently has been weaned to room air.  PCCM was following.  Speech therapy recommended regular diet.  On a steroid taper for asthma exacerbation.  Has mild leukocytosis, likely from steroids.  Paroxysmal A-fib with RVR: Currently in normal sinus rhythm.  On amiodarone .  Plan for 400 mg twice daily for 10 days followed by 200 mg daily.  On eliquis   ESRD: Nephrology following.  On TTS schedule.  Hypertension: On metoprolol   Hyperlipidemia: On Lipitor   Breast cancer: ER, PR positive.  On anastrozole   Chronic normocytic anemia: Hemoglobin stable.  No evidence of acute blood loss  GERD: Continue  PPI  Constipation: Continue bowel regimen  Debility/deconditioning: Consulted PT/OT, SNF recommended.  TOC consulted  Restless leg syndrome: Started on ropinirole   Goals of care: 78 year old female with multiple comorbidities.  Palliative care consulted for goals of care.  Current plan is to continue full scope of treatment    Nutrition Problem: Moderate Malnutrition Etiology: inability to eat, acute illness    DVT prophylaxis:iv heparin  apixaban  (ELIQUIS ) tablet 5 mg     Code Status: Full Code  Family Communication: None at the bedside  Patient status:Inpatient  Patient is from :home  Anticipated discharge to: SNF  Estimated DC date: Whenever bed is available at the SNF   Consultants: Cardiology, nephrology, palliative care, PCCM  Procedures: Dialysis, cardiac cath  Antimicrobials:  Anti-infectives (From admission, onward)    Start     Dose/Rate Route Frequency Ordered Stop   04/19/23 1800  oseltamivir  (TAMIFLU ) capsule 30 mg        30 mg Oral Every T-Th-Sa (1800) 04/18/23 0947 04/20/23 0629   04/19/23 1130  doxycycline  (VIBRA -TABS) tablet 100 mg        100 mg Oral Every 12 hours 04/19/23 1043 04/23/23 2142   04/19/23 1030  piperacillin -tazobactam (ZOSYN ) IVPB 2.25 g        2.25 g 100 mL/hr over 30 Minutes Intravenous Every 8 hours 04/19/23 0949 04/24/23 0133   04/17/23 2300  oseltamivir  (TAMIFLU ) capsule 30 mg        30 mg Oral Every T-Th-Sa (1800) 04/17/23 2101 04/18/23 0213   04/17/23 2200  oseltamivir  (TAMIFLU ) capsule 30 mg  Status:  Discontinued        30 mg Oral Daily at bedtime 04/17/23 0858 04/17/23 2101   04/17/23  1300  cefTRIAXone  (ROCEPHIN ) 2 g in sodium chloride  0.9 % 100 mL IVPB  Status:  Discontinued        2 g 200 mL/hr over 30 Minutes Intravenous Every 24 hours 04/17/23 1212 04/19/23 0914   04/17/23 1300  azithromycin  (ZITHROMAX ) tablet 500 mg  Status:  Discontinued        500 mg Oral Daily 04/17/23 1212 04/19/23 0914   04/17/23 1000   oseltamivir  (TAMIFLU ) capsule 30 mg  Status:  Discontinued        30 mg Oral Daily 04/16/23 2308 04/17/23 0858   04/16/23 1845  cefTRIAXone  (ROCEPHIN ) 1 g in sodium chloride  0.9 % 100 mL IVPB        1 g 200 mL/hr over 30 Minutes Intravenous  Once 04/16/23 1844 04/16/23 1930   04/16/23 1845  azithromycin  (ZITHROMAX ) 500 mg in sodium chloride  0.9 % 250 mL IVPB        500 mg 250 mL/hr over 60 Minutes Intravenous  Once 04/16/23 1844 04/16/23 2037   04/16/23 1545  oseltamivir  (TAMIFLU ) capsule 30 mg        30 mg Oral  Once 04/16/23 1543 04/16/23 1618       Subjective: Patient seen and examined at bedside today.  Hemodynamically stable.  Sitting on bed.  On room air.  Any shortness of breath or cough or chest pain.  No new complaints   Objective: Vitals:   04/23/23 2038 04/23/23 2322 04/24/23 0400 04/24/23 0756  BP:  (!) 123/54 118/88 (!) 154/56  Pulse: 68 (!) 58 (!) 56 65  Resp: 17 16 16 17   Temp:  97.8 F (36.6 C) 97.8 F (36.6 C) 97.9 F (36.6 C)  TempSrc:  Oral Oral Oral  SpO2: 95% 96% 98% 97%  Weight:      Height:        Intake/Output Summary (Last 24 hours) at 04/24/2023 1105 Last data filed at 04/24/2023 0400 Gross per 24 hour  Intake 591.8 ml  Output --  Net 591.8 ml   Filed Weights   04/21/23 0817 04/21/23 1230 04/23/23 0530  Weight: 69 kg 65.7 kg 60.5 kg    Examination:  General exam: Overall comfortable, not in distress, pleasant elderly female HEENT: PERRL Respiratory system:  no wheezes or crackles  Cardiovascular system: S1 & S2 heard, RRR.  Gastrointestinal system: Abdomen is nondistended, soft and nontender. Central nervous system: Alert and oriented Extremities: No edema, no clubbing ,no cyanosis Skin: No rashes, no ulcers,no icterus      Data Reviewed: I have personally reviewed following labs and imaging studies  CBC: Recent Labs  Lab 04/20/23 0926 04/21/23 0210 04/22/23 0219 04/23/23 0223 04/23/23 1256 04/23/23 1300 04/24/23 0216  WBC  12.0* 14.2* 12.5* 13.8*  --   --  15.9*  HGB 11.0* 9.6* 11.1* 10.3* 10.5*  10.5* 10.2* 10.7*  HCT 33.6* 29.2* 33.2* 31.6* 31.0*  31.0* 30.0* 31.9*  MCV 95.5 95.4 93.3 95.5  --   --  93.5  PLT 329 316 382 322  --   --  313   Basic Metabolic Panel: Recent Labs  Lab 04/19/23 1309 04/20/23 0926 04/21/23 0210 04/22/23 0219 04/23/23 0223 04/23/23 1256 04/23/23 1300 04/24/23 0216  NA 135 135 133* 140 138 136  135 134* 139  K 4.3 4.5 3.9 3.2* 3.5 3.8  3.7 3.7 3.9  CL 96* 92* 96* 99 98  --   --  102  CO2 25 27 20* 26 21*  --   --  23  GLUCOSE 185* 113* 103* 106* 126*  --   --  141*  BUN 46* 31* 51* 36* 60*  --   --  76*  CREATININE 6.45* 4.18* 5.44* 4.18* 6.41*  --   --  8.35*  CALCIUM  9.3 9.6 9.0 9.3 9.4  --   --  9.7  MG 2.3  --   --  2.1 1.9  --   --   --   PHOS  --   --   --  3.1 4.1  --   --   --      Recent Results (from the past 240 hours)  Resp panel by RT-PCR (RSV, Flu A&B, Covid) Anterior Nasal Swab     Status: Abnormal   Collection Time: 04/16/23 12:30 PM   Specimen: Anterior Nasal Swab  Result Value Ref Range Status   SARS Coronavirus 2 by RT PCR NEGATIVE NEGATIVE Final    Comment: (NOTE) SARS-CoV-2 target nucleic acids are NOT DETECTED.  The SARS-CoV-2 RNA is generally detectable in upper respiratory specimens during the acute phase of infection. The lowest concentration of SARS-CoV-2 viral copies this assay can detect is 138 copies/mL. A negative result does not preclude SARS-Cov-2 infection and should not be used as the sole basis for treatment or other patient management decisions. A negative result may occur with  improper specimen collection/handling, submission of specimen other than nasopharyngeal swab, presence of viral mutation(s) within the areas targeted by this assay, and inadequate number of viral copies(<138 copies/mL). A negative result must be combined with clinical observations, patient history, and epidemiological information. The expected  result is Negative.  Fact Sheet for Patients:  bloggercourse.com  Fact Sheet for Healthcare Providers:  seriousbroker.it  This test is no t yet approved or cleared by the [place]  FDA and  has been authorized for detection and/or diagnosis of SARS-CoV-2 by FDA under an Emergency Use Authorization (EUA). This EUA will remain  in effect (meaning this test can be used) for the duration of the COVID-19 declaration under Section 564(b)(1) of the Act, 21 U.S.C.section 360bbb-3(b)(1), unless the authorization is terminated  or revoked sooner.       Influenza A by PCR POSITIVE (A) NEGATIVE Final   Influenza B by PCR NEGATIVE NEGATIVE Final    Comment: (NOTE) The Xpert Xpress SARS-CoV-2/FLU/RSV plus assay is intended as an aid in the diagnosis of influenza from Nasopharyngeal swab specimens and should not be used as a sole basis for treatment. Nasal washings and aspirates are unacceptable for Xpert Xpress SARS-CoV-2/FLU/RSV testing.  Fact Sheet for Patients: bloggercourse.com  Fact Sheet for Healthcare Providers: seriousbroker.it  This test is not yet approved or cleared by the [place]  FDA and has been authorized for detection and/or diagnosis of SARS-CoV-2 by FDA under an Emergency Use Authorization (EUA). This EUA will remain in effect (meaning this test can be used) for the duration of the COVID-19 declaration under Section 564(b)(1) of the Act, 21 U.S.C. section 360bbb-3(b)(1), unless the authorization is terminated or revoked.     Resp Syncytial Virus by PCR NEGATIVE NEGATIVE Final    Comment: (NOTE) Fact Sheet for Patients: bloggercourse.com  Fact Sheet for Healthcare Providers: seriousbroker.it  This test is not yet approved or cleared by the [place]  FDA and has been authorized for detection and/or  diagnosis of SARS-CoV-2 by FDA under an Emergency Use Authorization (EUA). This EUA will remain in effect (meaning this test can be used) for the duration of the COVID-19 declaration under  Section 564(b)(1) of the Act, 21 U.S.C. section 360bbb-3(b)(1), unless the authorization is terminated or revoked.  Performed at [facility], [address] [place], [place] [address]   MRSA Next Gen by PCR, Nasal     Status: Abnormal   Collection Time: 04/16/23 10:43 PM   Specimen: Nasal Mucosa; Nasal Swab  Result Value Ref Range Status   MRSA by PCR Next Gen DETECTED (A) NOT DETECTED Final    Comment: RESULT CALLED TO, READ BACK BY AND VERIFIED WITH: TONYA,RN@0049  04/17/23 MK (NOTE) The GeneXpert MRSA Assay (FDA approved for NASAL specimens only), is one component of a comprehensive MRSA colonization surveillance program. It is not intended to diagnose MRSA infection nor to guide or monitor treatment for MRSA infections. Test performance is not FDA approved in patients less than 25 years old. Performed at [facility], [address] [place], [place] [address]      Radiology Studies: CARDIAC CATHETERIZATION Result Date: 04/23/2023   Prox LAD to Mid LAD lesion is 30% stenosed.   Mid Cx lesion is 85% stenosed.   Prox RCA to Dist RCA lesion is 20% stenosed.   LV end diastolic pressure is normal. 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 LVEDP 9 mm Hg, PCWP 6/4 mean 5 mm Hg Normal right heart pressures. PAP 30/7 mean 17 mm Hg Normal cardiac output 5.11 L/min, index 3.06 Plan: patient has no active angina. While the LCx could be treated with PCI I think the appropriate thing to do is treat her medically. She is at higher bleeding risk and needs DOAC for Afib. If she has refractory angina on medical therapy then LCx PCI could be considered.    Scheduled Meds:  albuterol   5 mg Nebulization Once   [START ON 04/30/2023] amiodarone   200 mg  Oral Daily   amiodarone   400 mg Oral BID   anastrozole   1 mg Oral Daily   apixaban   5 mg Oral BID   arformoterol   15 mcg Nebulization BID   aspirin  EC  81 mg Oral Daily   atorvastatin   80 mg Oral q AM   bisacodyl   10 mg Rectal Q0600   budesonide  (PULMICORT ) nebulizer solution  0.5 mg Nebulization BID   Chlorhexidine  Gluconate Cloth  6 each Topical Q0600   Chlorhexidine  Gluconate Cloth  6 each Topical Q0600   febuxostat   40 mg Oral q AM   feeding supplement  1 Container Oral TID BM   fluticasone   1 spray Each Nare Daily   metoprolol  succinate  100 mg Oral Daily   multivitamin  1 tablet Oral QHS   pantoprazole   40 mg Oral BID   polyethylene glycol  17 g Oral Daily   [START ON 04/25/2023] predniSONE   20 mg Oral Q breakfast   senna-docusate  2 tablet Oral BID   sevelamer  carbonate  800 mg Oral TID WC   sodium chloride  flush  3 mL Intravenous Q12H   traZODone   25 mg Oral QHS   Continuous Infusions:  sodium chloride        LOS: 8 days   Ivonne Mustache, MD [facility] P1/14/2025, 11:05 AM

## 2023-04-25 DIAGNOSIS — Z743 Need for continuous supervision: Secondary | ICD-10-CM | POA: Diagnosis not present

## 2023-04-25 DIAGNOSIS — M6281 Muscle weakness (generalized): Secondary | ICD-10-CM | POA: Diagnosis not present

## 2023-04-25 DIAGNOSIS — D689 Coagulation defect, unspecified: Secondary | ICD-10-CM | POA: Diagnosis not present

## 2023-04-25 DIAGNOSIS — R2681 Unsteadiness on feet: Secondary | ICD-10-CM | POA: Diagnosis not present

## 2023-04-25 DIAGNOSIS — N186 End stage renal disease: Secondary | ICD-10-CM | POA: Diagnosis not present

## 2023-04-25 DIAGNOSIS — D509 Iron deficiency anemia, unspecified: Secondary | ICD-10-CM | POA: Diagnosis not present

## 2023-04-25 DIAGNOSIS — I480 Paroxysmal atrial fibrillation: Secondary | ICD-10-CM | POA: Diagnosis not present

## 2023-04-25 DIAGNOSIS — R0602 Shortness of breath: Secondary | ICD-10-CM | POA: Diagnosis not present

## 2023-04-25 DIAGNOSIS — J9601 Acute respiratory failure with hypoxia: Secondary | ICD-10-CM | POA: Diagnosis not present

## 2023-04-25 DIAGNOSIS — I1311 Hypertensive heart and chronic kidney disease without heart failure, with stage 5 chronic kidney disease, or end stage renal disease: Secondary | ICD-10-CM | POA: Diagnosis not present

## 2023-04-25 DIAGNOSIS — Z4901 Encounter for fitting and adjustment of extracorporeal dialysis catheter: Secondary | ICD-10-CM | POA: Diagnosis not present

## 2023-04-25 DIAGNOSIS — Z992 Dependence on renal dialysis: Secondary | ICD-10-CM | POA: Diagnosis not present

## 2023-04-25 DIAGNOSIS — Z7401 Bed confinement status: Secondary | ICD-10-CM | POA: Diagnosis not present

## 2023-04-25 DIAGNOSIS — R52 Pain, unspecified: Secondary | ICD-10-CM | POA: Diagnosis not present

## 2023-04-25 DIAGNOSIS — I214 Non-ST elevation (NSTEMI) myocardial infarction: Secondary | ICD-10-CM | POA: Diagnosis not present

## 2023-04-25 DIAGNOSIS — N2581 Secondary hyperparathyroidism of renal origin: Secondary | ICD-10-CM | POA: Diagnosis not present

## 2023-04-25 DIAGNOSIS — I4891 Unspecified atrial fibrillation: Secondary | ICD-10-CM | POA: Diagnosis not present

## 2023-04-25 DIAGNOSIS — J4530 Mild persistent asthma, uncomplicated: Secondary | ICD-10-CM | POA: Diagnosis not present

## 2023-04-25 DIAGNOSIS — R531 Weakness: Secondary | ICD-10-CM | POA: Diagnosis not present

## 2023-04-25 DIAGNOSIS — M6259 Muscle wasting and atrophy, not elsewhere classified, multiple sites: Secondary | ICD-10-CM | POA: Diagnosis not present

## 2023-04-25 DIAGNOSIS — Z741 Need for assistance with personal care: Secondary | ICD-10-CM | POA: Diagnosis not present

## 2023-04-25 DIAGNOSIS — I120 Hypertensive chronic kidney disease with stage 5 chronic kidney disease or end stage renal disease: Secondary | ICD-10-CM | POA: Diagnosis not present

## 2023-04-25 LAB — CBC
HCT: 32.5 % — ABNORMAL LOW (ref 36.0–46.0)
Hemoglobin: 11.1 g/dL — ABNORMAL LOW (ref 12.0–15.0)
MCH: 31.7 pg (ref 26.0–34.0)
MCHC: 34.2 g/dL (ref 30.0–36.0)
MCV: 92.9 fL (ref 80.0–100.0)
Platelets: 282 10*3/uL (ref 150–400)
RBC: 3.5 MIL/uL — ABNORMAL LOW (ref 3.87–5.11)
RDW: 13.8 % (ref 11.5–15.5)
WBC: 14.1 10*3/uL — ABNORMAL HIGH (ref 4.0–10.5)
nRBC: 0 % (ref 0.0–0.2)

## 2023-04-25 MED ORDER — TRAZODONE HCL 50 MG PO TABS: 25.0000 mg | ORAL_TABLET | Freq: Every day | ORAL | Status: DC

## 2023-04-25 MED ORDER — ASPIRIN 81 MG PO TBEC: 81.0000 mg | DELAYED_RELEASE_TABLET | Freq: Every day | ORAL | Status: DC

## 2023-04-25 MED ORDER — AMIODARONE HCL 200 MG PO TABS: 200.0000 mg | ORAL_TABLET | Freq: Every day | ORAL | Status: DC

## 2023-04-25 MED ORDER — ALUM & MAG HYDROXIDE-SIMETH 200-200-20 MG/5ML PO SUSP: 30.0000 mL | Freq: Four times a day (QID) | ORAL | Status: DC | PRN

## 2023-04-25 MED ORDER — POLYETHYLENE GLYCOL 3350 17 G PO PACK: 17.0000 g | PACK | Freq: Every day | ORAL | Status: DC

## 2023-04-25 MED ORDER — ALUM & MAG HYDROXIDE-SIMETH 200-200-20 MG/5ML PO SUSP
30.0000 mL | Freq: Four times a day (QID) | ORAL | Status: DC | PRN
  Administered 2023-04-25: 30 mL via ORAL
  Filled 2023-04-25: qty 30

## 2023-04-25 MED ORDER — SENNOSIDES-DOCUSATE SODIUM 8.6-50 MG PO TABS: 2.0000 | ORAL_TABLET | Freq: Two times a day (BID) | ORAL | Status: DC

## 2023-04-25 MED ORDER — APIXABAN 5 MG PO TABS: 5.0000 mg | ORAL_TABLET | Freq: Two times a day (BID) | ORAL | Status: DC

## 2023-04-25 MED ORDER — ROPINIROLE HCL 0.5 MG PO TABS: 0.5000 mg | ORAL_TABLET | Freq: Two times a day (BID) | ORAL | Status: AC | PRN

## 2023-04-25 MED ORDER — METOPROLOL SUCCINATE ER 100 MG PO TB24: 100.0000 mg | ORAL_TABLET | Freq: Every day | ORAL | Status: DC

## 2023-04-25 MED ORDER — ATORVASTATIN CALCIUM 80 MG PO TABS: 80.0000 mg | ORAL_TABLET | Freq: Every morning | ORAL | Status: DC

## 2023-04-25 NOTE — Progress Notes (Signed)
 [facility] Progress Note  Subjective:  seen in room.  Pending for SNF HD yesterday 1.5L UF No c/o  Vitals:   04/25/23 0831 04/25/23 1049 04/25/23 1103 04/25/23 1108  BP: (!) 122/45 (!) 119/45  (!) 109/48  Pulse: (!) 53 61 62 (!) 59  Resp: 20  19 16   Temp: 98.1 F (36.7 C)  98.3 F (36.8 C)   TempSrc: Oral  Oral   SpO2: 96%  94% 91%  Weight:      Height:        Exam: Gen alert, comfortable No jvd or bruits Chest clear bilat; good air mov't in bases RRR no MRG Abd soft ntnd no mass or ascites +bs Ext no LE edema  Neuro is alert, Ox 3 , nf    R AVF +bruit, recent wound healing c/d/I; RIJ TDC     OP HD: [facility] TTS 4h   400/1.5   2/2.5 bath  64 kg  Heparin  4000   Right TDC (revised RUE AVF)  - mircera 50 mcg q 2, last 1/02, due 1/16     Assessment/ Plan: Acute hypoxic resp failure - due to pna, NSTEMI, and vol excess. No home O2. Weights variable, but prob need EDW down at DC.  Stable on RA Flu A/ multifocal pna - by CXR and CT chest. Per pmd. Largely resolved.  Afib / RVR -amio + DOAC per cariology ESRD - on HD TTS. Next HD 1/16. 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  But Hb > 11, trend for now.   Secondary hyperparathyroidism - CCa in range. Cont renvela  as binder ac.  HD access - sp AVF superficialization RUE surgery 04/13/23, per [facility] should be ready 4 wks from 04/13/23. TDC currently   Charletta Cons, MD  04/25/2023, 11:33 AM  Recent Labs  Lab 04/22/23 0219 04/23/23 0223 04/23/23 1256 04/23/23 1300 04/24/23 0216 04/25/23 0231  HGB 11.1* 10.3*   < > 10.2* 10.7* 11.1*  CALCIUM  9.3 9.4  --   --  9.7  --   PHOS 3.1 4.1  --   --   --   --   CREATININE 4.18* 6.41*  --   --  8.35*  --   K 3.2* 3.5   < > 3.7 3.9  --    < > = values in this interval not displayed.   No results for input(s): "IRON", "TIBC", "FERRITIN" in the last 168 hours. Inpatient  medications:  albuterol   5 mg Nebulization Once   [START ON 04/30/2023] amiodarone   200 mg Oral Daily   amiodarone   400 mg Oral BID   anastrozole   1 mg Oral Daily   apixaban   5 mg Oral BID   arformoterol   15 mcg Nebulization BID   aspirin  EC  81 mg Oral Daily   atorvastatin   80 mg Oral q AM   bisacodyl   10 mg Rectal Q0600   budesonide  (PULMICORT ) nebulizer solution  0.5 mg Nebulization BID   Chlorhexidine  Gluconate Cloth  6 each Topical Q0600   Chlorhexidine  Gluconate Cloth  6 each Topical Q0600   febuxostat   40 mg Oral q AM   feeding supplement  1 Container Oral TID BM   fluticasone   1 spray Each Nare Daily   metoprolol  succinate  100 mg Oral Daily   multivitamin  1 tablet Oral QHS  pantoprazole   40 mg Oral BID   polyethylene glycol  17 g Oral Daily   predniSONE   20 mg Oral Q breakfast   senna-docusate  2 tablet Oral BID   sevelamer  carbonate  800 mg Oral TID WC   sodium chloride  flush  3 mL Intravenous Q12H   traZODone   25 mg Oral QHS     acetaminophen  **OR** acetaminophen , alum & mag hydroxide-simeth, diclofenac  Sodium, ondansetron  **OR** ondansetron  (ZOFRAN ) IV, rOPINIRole , sodium chloride  flush

## 2023-04-25 NOTE — Progress Notes (Signed)
 Progress Note  Patient Name: Rachel Villarreal Date of Encounter: 04/25/2023  Primary Cardiologist:   None   Subjective   No acute complaints.  No pain.  NO SOB.   Inpatient Medications    Scheduled Meds:  albuterol   5 mg Nebulization Once   [START ON 04/30/2023] amiodarone   200 mg Oral Daily   amiodarone   400 mg Oral BID   anastrozole   1 mg Oral Daily   apixaban   5 mg Oral BID   arformoterol   15 mcg Nebulization BID   aspirin  EC  81 mg Oral Daily   atorvastatin   80 mg Oral q AM   bisacodyl   10 mg Rectal Q0600   budesonide  (PULMICORT ) nebulizer solution  0.5 mg Nebulization BID   Chlorhexidine  Gluconate Cloth  6 each Topical Q0600   Chlorhexidine  Gluconate Cloth  6 each Topical Q0600   febuxostat   40 mg Oral q AM   feeding supplement  1 Container Oral TID BM   fluticasone   1 spray Each Nare Daily   metoprolol  succinate  100 mg Oral Daily   multivitamin  1 tablet Oral QHS   pantoprazole   40 mg Oral BID   polyethylene glycol  17 g Oral Daily   predniSONE   20 mg Oral Q breakfast   senna-docusate  2 tablet Oral BID   sevelamer  carbonate  800 mg Oral TID WC   sodium chloride  flush  3 mL Intravenous Q12H   traZODone   25 mg Oral QHS   Continuous Infusions:  PRN Meds: acetaminophen  **OR** acetaminophen , diclofenac  Sodium, ondansetron  **OR** ondansetron  (ZOFRAN ) IV, rOPINIRole , sodium chloride  flush   Vital Signs    Vitals:   04/24/23 2022 04/24/23 2340 04/25/23 0256 04/25/23 0831  BP:  (!) 130/45 (!) 117/46 (!) 122/45  Pulse:  62 (!) 58 (!) 53  Resp:  15 15 20   Temp:  98.3 F (36.8 C) 98 F (36.7 C) 98.1 F (36.7 C)  TempSrc:  Oral Oral Oral  SpO2: 93% 93% 95% 96%  Weight:      Height:        Intake/Output Summary (Last 24 hours) at 04/25/2023 0918 Last data filed at 04/25/2023 0838 Gross per 24 hour  Intake 120 ml  Output 1500 ml  Net -1380 ml   Filed Weights   04/23/23 0530 04/24/23 1315 04/24/23 1639  Weight: 60.5 kg 68 kg 67.5 kg    Telemetry     NSR - Personally Reviewed  ECG    NA - Personally Reviewed  Physical Exam   GEN: No acute distress.   Neck: No  JVD Cardiac: RRR, no murmurs, rubs, or gallops.  Respiratory: Clear  to auscultation bilaterally. GI: Soft, nontender, non-distended  MS: No  edema; No deformity. Neuro:  Nonfocal  Psych: Normal affect   Labs    Chemistry Recent Labs  Lab 04/22/23 0219 04/23/23 0223 04/23/23 1256 04/23/23 1300 04/24/23 0216  NA 140 138 136  135 134* 139  K 3.2* 3.5 3.8  3.7 3.7 3.9  CL 99 98  --   --  102  CO2 26 21*  --   --  23  GLUCOSE 106* 126*  --   --  141*  BUN 36* 60*  --   --  76*  CREATININE 4.18* 6.41*  --   --  8.35*  CALCIUM  9.3 9.4  --   --  9.7  GFRNONAA 10* 6*  --   --  4*  ANIONGAP 15 19*  --   --  14     Hematology Recent Labs  Lab 04/23/23 0223 04/23/23 1256 04/23/23 1300 04/24/23 0216 04/25/23 0231  WBC 13.8*  --   --  15.9* 14.1*  RBC 3.31*  --   --  3.41* 3.50*  HGB 10.3*   < > 10.2* 10.7* 11.1*  HCT 31.6*   < > 30.0* 31.9* 32.5*  MCV 95.5  --   --  93.5 92.9  MCH 31.1  --   --  31.4 31.7  MCHC 32.6  --   --  33.5 34.2  RDW 13.5  --   --  13.7 13.8  PLT 322  --   --  313 282   < > = values in this interval not displayed.    Cardiac EnzymesNo results for input(s): "TROPONINI" in the last 168 hours. No results for input(s): "TROPIPOC" in the last 168 hours.   BNPNo results for input(s): "BNP", "PROBNP" in the last 168 hours.   DDimer No results for input(s): "DDIMER" in the last 168 hours.   Radiology    CARDIAC CATHETERIZATION Result Date: 04/23/2023   Prox LAD to Mid LAD lesion is 30% stenosed.   Mid Cx lesion is 85% stenosed.   Prox RCA to Dist RCA lesion is 20% stenosed.   LV end diastolic pressure is normal. 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 LVEDP 9 mm Hg, PCWP 6/4 mean 5 mm Hg Normal right heart pressures. PAP 30/7 mean 17 mm Hg Normal cardiac output 5.11 L/min, index 3.06 Plan:  patient has no active angina. While the LCx could be treated with PCI I think the appropriate thing to do is treat her medically. She is at higher bleeding risk and needs DOAC for Afib. If she has refractory angina on medical therapy then LCx PCI could be considered.    Cardiac Studies   Echo:    1. Left ventricular ejection fraction, by estimation, is 35 to 40%. The  left ventricle has moderately decreased function. The left ventricle  demonstrates global hypokinesis. There is mild concentric left ventricular  hypertrophy. Left ventricular  diastolic parameters are indeterminate. There is the interventricular  septum is flattened in systole and diastole, consistent with right  ventricular pressure and volume overload.   2. Right ventricular systolic function is moderately reduced. The right  ventricular size is moderately enlarged. There is mildly elevated  pulmonary artery systolic pressure. The estimated right ventricular  systolic pressure is 44.0 mmHg.   3. Left atrial size was severely dilated.   4. Right atrial size was moderately dilated.   5. The mitral valve is grossly normal. Mild mitral valve regurgitation.  No evidence of mitral stenosis.   6. Tricuspid valve regurgitation is mild to moderate.   7. The aortic valve is tricuspid. Aortic valve regurgitation is not  visualized. No aortic stenosis is present.   8. The inferior vena cava is dilated in size with >50% respiratory  variability, suggesting right atrial pressure of 8 mmHg.    Cardiac cath:   Diagnostic Dominance: Right   Patient Profile     78 y.o. female with a hx of ESRD on IHD, HTN, HLD, prior breast CA, gout, and GERD who is being seen 04/16/2023 for the evaluation of elevated troponins at the request of Dr. Adrain Alar.   Assessment & Plan    Acute hypoxic respiratory failure:   Improved.     Atrial fib with RVR:   No change in therapy.  Discharge  on amiodarone  200 mg daily.     NSTEMI:    For now ASA  and DOAC.    Anatomy as above.  Films reviewed and results discussed with the patient.  Plan medical management. Continue beta blocker. Continue ASA and Eliquis  for now given atrial fib and NSTEMI.    I sent a message to her new primary cardiologist to review DAPT at the next visit.  I would not suggest both agents past 30 days.     HTN:    BP at target.    ESRD: Per nephrology.    Acute systolic HF:    Euvolemic.  Labile BP precludes med titration.   For questions or updates, please contact [facility] Please consult [email or web address] for contact info under Cardiology/STEMI.   Signed, Eilleen Grates, MD  04/25/2023, 9:18 AM

## 2023-04-25 NOTE — TOC Transition Note (Signed)
 Transition of Care [facility]) - Discharge Note   Patient Details  Name: Rachel Villarreal MRN: [id number] Date of Birth: [birth year]  Transition of Care [facility]) CM/SW Contact:  Juliane Och, LCSW Phone Number: 04/25/2023, 1:32 PM   Clinical Narrative:     Patient will DC to: [facility] Anticipated DC date: 04/25/2023 Friend notified: By patient Transport by: Lyna Sandhoff   Per MD patient ready for DC to [facility]. RN to call report prior to discharge [phone]). RN, patient, patient's friend (called by patient), and facility notified of DC. Discharge Summary and FL2 sent to facility. DC packet on chart. Ambulance transport requested for patient.   CSW will sign off for now as social work intervention is no longer needed. Please consult us  again if new needs arise.   Final next level of care: Skilled Nursing Facility Barriers to Discharge: Barriers Resolved   Patient Goals and CMS Choice Patient states their goals for this hospitalization and ongoing recovery are:: SNF CMS Medicare.gov Compare Post Acute Care list provided to:: Patient Choice offered to / list presented to : Patient      Discharge Placement              Patient chooses bed at: [facility] Patient to be transferred to facility by: [facility]      Discharge Plan and Services Additional resources added to the After Visit Summary for   In-house Referral: Clinical Social Work   Post Acute Care Choice: Skilled Nursing Facility                               Social Drivers of Health (SDOH) Interventions SDOH Screenings   Food Insecurity: No Food Insecurity (04/16/2023)  Housing: Low Risk  (04/16/2023)  Transportation Needs: No Transportation Needs (04/16/2023)  Utilities: Not At Risk (04/16/2023)  Depression (PHQ2-9): Low Risk  (02/28/2022)  Financial Resource Strain: Low Risk  (09/02/2021)  Social Connections: Moderately Integrated (04/16/2023)  Stress: No Stress  Concern Present (09/02/2021)  Tobacco Use: Medium Risk (04/16/2023)     Readmission Risk Interventions     No data to display

## 2023-04-25 NOTE — Progress Notes (Signed)
 Cardiology follow up arranged on 05/10/23 per Dr Lavonne Prairie request, see AVS

## 2023-04-25 NOTE — Consult Note (Signed)
 [facility] Liaison Consult Note   04/25/2023  Rachel Villarreal 01-29-1946 [id number]  Insurance: [place] [employer] Medicare   Primary Care Provider: Jearldine Mina, MD, with [facility], this provider is listed for the transition of care follow up appointments  and with the [facility] team for calls   [facility] Liaison screened the patient remotely at [facility].  Patient is on droplet precautions and to preserve PPE for unit and hospital staff.   The patient was screened for LLOS of 9  day hospitalization with noted high risk score for unplanned readmission risk 2 hospital admissions in 6 months.  Patient is being considered for a skilled nursing facility level of care for post hospital transition. Currently, Patient is showing for [facility].  If the patient goes to this  VBCI affiliated facility then, patient can be followed by [facility] RN with traditional Medicare and approved Medicare Advantage plans.    Plan:  If transitions to affiliated facility, then will notify the [facility] RN can follow for any known or needs for transitional care needs for returning to post facility care coordination needs to return to community.  For questions or referrals, please contact:  [place], RN, BSN, CCM [place]  [facility] Liaison Direct Dial : [phone] or secure chat Email: [email or web address]

## 2023-04-25 NOTE — Evaluation (Signed)
 Occupational Therapy Evaluation Patient Details Name: Rachel Villarreal MRN: [id number] DOB: 20-Aug-1945 Today's Date: 04/25/2023   History of Present Illness Pt is 78 yo presenting to [facility] ED with shortness of breathe. Pt was found to have multifocal pneumonia with influenza A. Pt then had evidence of NSTEMI with a-fib and RVR. Cath planned for 04/22/22. PMH: HTN, ESRD on HD, breast cancer.   Clinical Impression   Pt reports ind at baseline with ADLs/functional mobility, lives alone but has PRN assist from neighbors. Pt needing up to mod A for ADLs, CGA for bed mobility and min A for transfers without AD. Pt frequently reaching out for walls/therapist as external support. SpO2 with poor wave pleth during ambulation, but was mid 90's when semi-reclined in bed at end of session. Pt presenting with impairments listed below, will follow acutely. Patient will benefit from continued inpatient follow up therapy, <3 hours/day to maximize safety/ind with ADL/functional mobility.        If plan is discharge home, recommend the following: A little help with walking and/or transfers;A little help with bathing/dressing/bathroom;Assistance with cooking/housework;Help with stairs or ramp for entrance    Functional Status Assessment  Patient has had a recent decline in their functional status and demonstrates the ability to make significant improvements in function in a reasonable and predictable amount of time.  Equipment Recommendations  Other (comment) (defer)    Recommendations for Other Services PT consult     Precautions / Restrictions Precautions Precautions: Fall Precaution Comments: droplet Restrictions Weight Bearing Restrictions Per Provider Order: No      Mobility Bed Mobility Overal bed mobility: Needs Assistance Bed Mobility: Supine to Sit, Sit to Supine     Supine to sit: Contact guard Sit to supine: Contact guard assist        Transfers Overall transfer level: Needs  assistance Equipment used: 1 person hand held assist Transfers: Sit to/from Stand Sit to Stand: Min assist                  Balance Overall balance assessment: Needs assistance Sitting-balance support: Single extremity supported, Feet supported, Feet unsupported Sitting balance-Leahy Scale: Good     Standing balance support: During functional activity, Single extremity supported Standing balance-Leahy Scale: Fair                             ADL either performed or assessed with clinical judgement   ADL Overall ADL's : Needs assistance/impaired Eating/Feeding: Set up   Grooming: Set up   Upper Body Bathing: Minimal assistance   Lower Body Bathing: Moderate assistance   Upper Body Dressing : Minimal assistance   Lower Body Dressing: Minimal assistance   Toilet Transfer: Contact guard assist;Ambulation;Regular Toilet   Toileting- Clothing Manipulation and Hygiene: Contact guard assist       Functional mobility during ADLs: Contact guard assist       Vision   Vision Assessment?: No apparent visual deficits     Perception Perception: Not tested       Praxis Praxis: Not tested       Pertinent Vitals/Pain Pain Assessment Pain Assessment: No/denies pain     Extremity/Trunk Assessment Upper Extremity Assessment Upper Extremity Assessment: Generalized weakness   Lower Extremity Assessment Lower Extremity Assessment: Defer to PT evaluation   Cervical / Trunk Assessment Cervical / Trunk Assessment: Normal   Communication Communication Communication: No apparent difficulties   Cognition Arousal: Alert Behavior During Therapy: [facility] for tasks  assessed/performed Overall Cognitive Status: Within Functional Limits for tasks assessed                                       General Comments  SpO2 with poor wave form dropping to high 70s on RA, pt denies SOB/DOE, wave form improved once still supine in bed, mid 90s    Exercises      Shoulder Instructions      Home Living Family/patient expects to be discharged to:: Private residence Living Arrangements: Alone Available Help at Discharge: Friend(s);Available PRN/intermittently Type of Home: House Home Access: Stairs to enter [employer] of Steps: 3-4 Entrance Stairs-Rails: Left Home Layout: Two level;Able to live on main level with bedroom/bathroom Alternate Level Stairs-Number of Steps: a step to the den and a step to the kitchen. Alternate Level Stairs-Rails: None Bathroom Shower/Tub: Producer, television/film/video: Handicapped height     Home Equipment: Shower seat - built in          Prior Functioning/Environment Prior Level of Function : Independent/Modified Independent;Driving             Mobility Comments: ambulated without an AD ADLs Comments: Ind with ADL's and IADL's.        OT Problem List: Decreased range of motion;Decreased strength;Decreased activity tolerance      OT Treatment/Interventions: Self-care/ADL training;Therapeutic exercise;Energy conservation;DME and/or AE instruction;Therapeutic activities;Balance training;Patient/family education    OT Goals(Current goals can be found in the care plan section) Acute Rehab OT Goals Patient Stated Goal: none stated OT Goal Formulation: With patient Time For Goal Achievement: 05/09/23 Potential to Achieve Goals: Good ADL Goals Pt Will Perform Upper Body Dressing: with modified independence;standing Pt Will Perform Lower Body Dressing: with modified independence;sitting/lateral leans;sit to/from stand Pt Will Transfer to Toilet: with modified independence;ambulating;regular height toilet Pt Will Perform Tub/Shower Transfer: Tub transfer;Shower transfer;with modified independence;ambulating Additional ADL Goal #1: pt will verbalize x3 energy conservation strategies in prep for ADLs  OT Frequency: Min 1X/week    Co-evaluation              AM-PAC OT "6 Clicks"  Daily Activity     Outcome Measure Help from another person eating meals?: A Little Help from another person taking care of personal grooming?: A Little Help from another person toileting, which includes using toliet, bedpan, or urinal?: A Little Help from another person bathing (including washing, rinsing, drying)?: A Lot Help from another person to put on and taking off regular upper body clothing?: A Little Help from another person to put on and taking off regular lower body clothing?: A Little 6 Click Score: 17   End of Session Equipment Utilized During Treatment: Gait belt Nurse Communication: Mobility status  Activity Tolerance: Patient tolerated treatment well Patient left: in bed;with call bell/phone within reach;with bed alarm set  OT Visit Diagnosis: Unsteadiness on feet (R26.81);Other abnormalities of gait and mobility (R26.89);Muscle weakness (generalized) (M62.81)                Time: 1610-9604 OT Time Calculation (min): 21 min Charges:  OT General Charges $OT Visit: 1 Visit OT Evaluation $OT Eval Moderate Complexity: 1 Mod  Andra Matsuo K, OTD, OTR/L SecureChat Preferred Acute Rehab [phone]   Jaye Saal K Koonce 04/25/2023, 1:02 PM

## 2023-04-25 NOTE — Discharge Planning (Signed)
 [facility] Patient Discharge Orders- [facility] CLINIC: [facility]  Patient's name: Rachel Villarreal Admit/DC Dates: 04/16/2023 - 04/25/2023  Discharge Diagnoses: NSTEMI s/p LHC. Obstructive CAD. Medical management   ARF/Pneumonia   AFib/RVR  - Resolved   Recent Labs  Lab 04/23/23 0223 04/23/23 1256 04/24/23 0216 04/25/23 0231  HGB 10.3*   < > 10.7* 11.1*  K 3.5   < > 3.9  --   CALCIUM  9.4  --  9.7  --   PHOS 4.1  --   --   --    < > = values in this interval not displayed.   Aranesp: Given: --   Date of last dose/amount: --   PRBC's Given: -- Date/# of units: -- Mircera : 50 mcg IV q 2 wks   Outpatient Dialysis Orders:  -Heparin : No change  -EDW No change   -Bath: No change   Access intervention/Change: ---  IV antibiotics:--  Anticoagulation: on Eliquis    CODE STATUS: Had palliative care consult. Remains full code/full scope    Completed by: Elona Hal PA-C   D/C Meds to be reconciled by nurse after every discharge.    Reviewed by: MD:______ RN_______

## 2023-04-25 NOTE — Discharge Summary (Signed)
 Physician Discharge Summary  Rachel Villarreal YQM:[id number] DOB: 1945-06-20 DOA: 04/16/2023  PCP: Jearldine Mina, MD  Admit date: 04/16/2023 Discharge date: 04/25/2023  Admitted From: Home Disposition:  Home  Discharge Condition:Stable CODE STATUS:FULL Diet recommendation: Renal  Brief/Interim Summary: Patient is a 78 year old female with history of hypertension, ESRD on dialysis, breast cancer who presented with shortness of breath.  Found to have multifocal pneumonia, influenza A infection.  Hospital course remarkable for an NSTEMI, A-fib with RVR.  Nephrology, cardiology following.  S/P  left heart cath.  Lives alone.  PT/OT recommending SNF for discharge.  Now clinically stable. hemodynamically stable for discharge to SNF.  Following problems were addressed during the hospitalization:  NSTEMI: Cardiology following.  Continue aspirin , statin, beta-blocker.  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  Recommended medical management.  No anginal symptoms now.   Acute hypoxic respiratory failure/multifocal pneumonia likely from aspiration/influenza A: Presented with shortness of breath.  Found to have multifocal pneumonia, influenza A positive.  Fluid overload component also contributing.  Completed antibiotics course.  Currently has been weaned to room air.  PCCM was following.  Speech therapy recommended regular diet.   Has mild leukocytosis, likely from steroids.   Paroxysmal A-fib with RVR: Currently in normal sinus rhythm.  On amiodarone  200 mg daily,metoprolol  .  On eliquis    ESRD: Nephrology following.  On TTS schedule.   Hypertension: On metoprolol    Hyperlipidemia: On Lipitor    Breast cancer: ER, PR positive.  On anastrozole    Chronic normocytic anemia: Hemoglobin stable.  No evidence of acute blood loss   GERD: Continue PPI   Constipation: Continue bowel regimen   Debility/deconditioning: Consulted PT/OT, SNF  recommended.  TOC consulted   Restless leg syndrome: Started on ropinirole    Goals of care: 78 year old female with multiple comorbidities.  Palliative care consulted for goals of care.  Current plan is to continue full scope of treatment   Discharge Diagnoses:  Principal Problem:   Non-ST elevated myocardial infarction [facility]) Active Problems:   Influenza A with pneumonia   Paroxysmal atrial fibrillation with RVR (HCC)   ESRD (end stage renal disease) (HCC)   Essential hypertension   Malnutrition of moderate degree    Discharge Instructions  Discharge Instructions     Diet - low sodium heart healthy   Complete by: As directed    Discharge instructions   Complete by: As directed    1)Please take your medications as instructed 2)Follow up with cardiology as an outpatient.Name and number of the provider has been attached   Increase activity slowly   Complete by: As directed       Allergies as of 04/25/2023       Reactions   Ace Inhibitors    angioedema   Lactose Intolerance (gi) Other (See Comments)   GI upset   Letrozole  Hives   Lisinopril Swelling   Mercury    Red eyes, through eye drops that contained thimerosal    Tape Dermatitis   plastic        Medication List     STOP taking these medications    amLODipine  5 MG tablet Commonly known as: NORVASC    furosemide  80 MG tablet Commonly known as: LASIX    losartan  100 MG tablet Commonly known as: COZAAR        TAKE these medications    acetaminophen  500 MG tablet Commonly known as: TYLENOL  Take 500 mg by mouth every 6 (six) hours as needed for  mild pain (pain score 1-3).   alum & mag hydroxide-simeth 200-200-20 MG/5ML suspension Commonly known as: MAALOX/MYLANTA Take 30 mLs by mouth every 6 (six) hours as needed for indigestion or heartburn.   amiodarone  200 MG tablet Commonly known as: PACERONE  Take 1 tablet (200 mg total) by mouth daily. Start taking on: April 30, 2023   anastrozole  1 MG  tablet Commonly known as: ARIMIDEX  Take 1 tablet (1 mg total) by mouth daily.   apixaban  5 MG Tabs tablet Commonly known as: ELIQUIS  Take 1 tablet (5 mg total) by mouth 2 (two) times daily.   aspirin  EC 81 MG tablet Take 1 tablet (81 mg total) by mouth daily. Swallow whole. Start taking on: April 26, 2023   atorvastatin  80 MG tablet Commonly known as: LIPITOR  Take 1 tablet (80 mg total) by mouth in the morning. Start taking on: April 26, 2023 What changed:  medication strength how much to take   budesonide  32 MCG/ACT nasal spray Commonly known as: RHINOCORT  AQUA Place 2 sprays into both nostrils in the morning.   Cholecalciferol 50 MCG (2000 UT) Caps Take 2,000 Units by mouth in the morning.   Claritin  10 MG tablet Generic drug: loratadine  Take 10 mg by mouth 2 (two) times daily.   eszopiclone 2 MG Tabs tablet Commonly known as: LUNESTA Take 2 mg by mouth at bedtime.   febuxostat  40 MG tablet Commonly known as: ULORIC  Take 40 mg by mouth in the morning.   metoprolol  succinate 100 MG 24 hr tablet Commonly known as: TOPROL -XL Take 1 tablet (100 mg total) by mouth daily. Take with or immediately following a meal. Start taking on: April 26, 2023 What changed:  medication strength how much to take when to take this additional instructions   pantoprazole  40 MG tablet Commonly known as: PROTONIX  Take 40 mg by mouth daily.   polyethylene glycol 17 g packet Commonly known as: MIRALAX  / GLYCOLAX  Take 17 g by mouth daily. Start taking on: April 26, 2023   PRESERVISION AREDS 2+MULTI VIT PO Take 1 capsule by mouth 2 (two) times daily.   ProAir  HFA 108 (90 Base) MCG/ACT inhaler Generic drug: albuterol  Inhale 1-2 puffs into the lungs every 6 (six) hours as needed for shortness of breath or wheezing (for respiratory issues.).   rOPINIRole  0.5 MG tablet Commonly known as: REQUIP  Take 1 tablet (0.5 mg total) by mouth every 12 (twelve) hours as needed (Restless  legs).   senna-docusate 8.6-50 MG tablet Commonly known as: Senokot-S Take 2 tablets by mouth 2 (two) times daily.   sevelamer  carbonate 800 MG tablet Commonly known as: RENVELA  Take 800 mg by mouth 3 (three) times daily with meals.   traZODone  50 MG tablet Commonly known as: DESYREL  Take 0.5 tablets (25 mg total) by mouth at bedtime.   Voltaren  1 % Gel Generic drug: diclofenac  Sodium Apply 1 Application topically 4 (four) times daily as needed (pain.).        Follow-up Information     Gerald Kitty., NP Follow up on 05/10/2023.   Specialty: Cardiology Why: AT 1055am for your cardiology follow up appointment Contact information: [address] [place] [address] [phone]                Allergies  Allergen Reactions   Ace Inhibitors     angioedema   Lactose Intolerance (Gi) Other (See Comments)    GI upset   Letrozole  Hives   Lisinopril Swelling   Mercury  Red eyes, through eye drops that contained thimerosal    Tape Dermatitis    plastic    Consultations: Cardiology   Procedures/Studies: CARDIAC CATHETERIZATION Result Date: 04/23/2023   Prox LAD to Mid LAD lesion is 30% stenosed.   Mid Cx lesion is 85% stenosed.   Prox RCA to Dist RCA lesion is 20% stenosed.   LV end diastolic pressure is normal. 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 LVEDP 9 mm Hg, PCWP 6/4 mean 5 mm Hg Normal right heart pressures. PAP 30/7 mean 17 mm Hg Normal cardiac output 5.11 L/min, index 3.06 Plan: patient has no active angina. While the LCx could be treated with PCI I think the appropriate thing to do is treat her medically. She is at higher bleeding risk and needs DOAC for Afib. If she has refractory angina on medical therapy then LCx PCI could be considered.   DG Abd 1 View Result Date: 04/20/2023 CLINICAL DATA:  78 year old female with nausea and vomiting. History of breast cancer. EXAM: ABDOMEN - 1 VIEW  COMPARISON:  Restaging CT Abdomen and Pelvis 11/23/2022. FINDINGS: AP view at 1049 hours. Chronic pelvic surgical clips. Chronic distal large bowel diverticulosis. Non obstructed bowel gas pattern. Stable abdominal visceral contours. No acute or suspicious osseous abnormality identified. IMPRESSION: Non obstructed bowel gas pattern. Chronic distal large bowel diverticulosis. Electronically Signed   By: Marlise Simpers M.D.   On: 04/20/2023 12:07   DG CHEST PORT 1 VIEW Result Date: 04/19/2023 CLINICAL DATA:  Shortness of breath EXAM: PORTABLE CHEST 1 VIEW COMPARISON:  04/16/2023 FINDINGS: Right dialysis catheter remains in place, unchanged. Cardiomegaly, vascular congestion. Small right pleural effusion and bibasilar opacities, likely atelectasis. No acute bony abnormality or overt edema. IMPRESSION: Cardiomegaly, vascular congestion. Small right effusion. Bibasilar atelectasis. Electronically Signed   By: Janeece Mechanic M.D.   On: 04/19/2023 02:08   ECHOCARDIOGRAM COMPLETE Result Date: 04/17/2023    ECHOCARDIOGRAM REPORT   Patient Name:   Rachel Villarreal Delon Date of Exam: 04/17/2023 Medical Rec #:  [id number]       Height:       62.0 in Accession #:    [id number]      Weight:       147.5 lb Date of Birth:  08/01/1945       BSA:          1.680 m Patient Age:    77 years        BP:           126/65 mmHg Patient Gender: F               HR:           96 bpm. Exam Location:  Inpatient Procedure: 2D Echo, Cardiac Doppler, Color Doppler and Intracardiac            Opacification Agent Indications:    NSTEMI  History:        Patient has no prior history of Echocardiogram examinations.                 Risk Factors:Hypertension and Former Smoker.  Sonographer:    Reta Cassis Referring Phys: [id number] Christena Covert  Sonographer Comments: Technically challenging study due to limited acoustic windows. IMPRESSIONS  1. Left ventricular ejection fraction, by estimation, is 35 to 40%. The left ventricle has moderately decreased function. The left  ventricle demonstrates global hypokinesis. There is mild concentric left ventricular hypertrophy. Left ventricular diastolic parameters are indeterminate. There  is the interventricular septum is flattened in systole and diastole, consistent with right ventricular pressure and volume overload.  2. Right ventricular systolic function is moderately reduced. The right ventricular size is moderately enlarged. There is mildly elevated pulmonary artery systolic pressure. The estimated right ventricular systolic pressure is 44.0 mmHg.  3. Left atrial size was severely dilated.  4. Right atrial size was moderately dilated.  5. The mitral valve is grossly normal. Mild mitral valve regurgitation. No evidence of mitral stenosis.  6. Tricuspid valve regurgitation is mild to moderate.  7. The aortic valve is tricuspid. Aortic valve regurgitation is not visualized. No aortic stenosis is present.  8. The inferior vena cava is dilated in size with >50% respiratory variability, suggesting right atrial pressure of 8 mmHg. FINDINGS  Left Ventricle: Left ventricular ejection fraction, by estimation, is 35 to 40%. The left ventricle has moderately decreased function. The left ventricle demonstrates global hypokinesis. Definity  contrast agent was given IV to delineate the left ventricular endocardial borders. The left ventricular internal cavity size was normal in size. There is mild concentric left ventricular hypertrophy. The interventricular septum is flattened in systole and diastole, consistent with right ventricular pressure and volume overload. Left ventricular diastolic parameters are indeterminate. Right Ventricle: The right ventricular size is moderately enlarged. No increase in right ventricular wall thickness. Right ventricular systolic function is moderately reduced. There is mildly elevated pulmonary artery systolic pressure. The tricuspid regurgitant velocity is 3.00 m/s, and with an assumed right atrial pressure of 8 mmHg,  the estimated right ventricular systolic pressure is 44.0 mmHg. Left Atrium: Left atrial size was severely dilated. Right Atrium: Right atrial size was moderately dilated. Pericardium: Trivial pericardial effusion is present. Mitral Valve: The mitral valve is grossly normal. Mild mitral valve regurgitation. No evidence of mitral valve stenosis. Tricuspid Valve: The tricuspid valve is grossly normal. Tricuspid valve regurgitation is mild to moderate. No evidence of tricuspid stenosis. Aortic Valve: The aortic valve is tricuspid. Aortic valve regurgitation is not visualized. No aortic stenosis is present. Aortic valve mean gradient measures 7.0 mmHg. Aortic valve peak gradient measures 15.5 mmHg. Aortic valve area, by VTI measures 1.73  cm. Pulmonic Valve: The pulmonic valve was grossly normal. Pulmonic valve regurgitation is mild. No evidence of pulmonic stenosis. Aorta: The aortic root and ascending aorta are structurally normal, with no evidence of dilitation. Venous: The inferior vena cava is dilated in size with greater than 50% respiratory variability, suggesting right atrial pressure of 8 mmHg. IAS/Shunts: The atrial septum is grossly normal. Additional Comments: There is a small pleural effusion in the left lateral region.  LEFT VENTRICLE PLAX 2D LVIDd:         5.20 cm      Diastology LVIDs:         3.70 cm      LV e' medial:    6.85 cm/s LV PW:         1.30 cm      LV E/e' medial:  20.1 LV IVS:        1.20 cm      LV e' lateral:   10.00 cm/s LVOT diam:     2.00 cm      LV E/e' lateral: 13.8 LV SV:         66 LV SV Index:   39 LVOT Area:     3.14 cm  LV Volumes (MOD) LV vol d, MOD A2C: 102.0 ml LV vol d, MOD A4C: 86.1 ml LV vol s,  MOD A2C: 40.7 ml LV vol s, MOD A4C: 45.4 ml LV SV MOD A2C:     61.3 ml LV SV MOD A4C:     86.1 ml LV SV MOD BP:      50.6 ml RIGHT VENTRICLE             IVC RV Basal diam:  4.70 cm     IVC diam: 2.70 cm RV S prime:     12.00 cm/s TAPSE (M-mode): 2.3 cm LEFT ATRIUM               Index        RIGHT ATRIUM           Index LA diam:        4.80 cm  2.86 cm/m   RA Area:     20.90 cm LA Vol (A2C):   111.0 ml 66.09 ml/m  RA Volume:   74.60 ml  44.42 ml/m LA Vol (A4C):   68.0 ml  40.49 ml/m LA Biplane Vol: 89.1 ml  53.05 ml/m  AORTIC VALVE AV Area (Vmax):    1.58 cm AV Area (Vmean):   1.62 cm AV Area (VTI):     1.73 cm AV Vmax:           197.00 cm/s AV Vmean:          124.000 cm/s AV VTI:            0.381 m AV Peak Grad:      15.5 mmHg AV Mean Grad:      7.0 mmHg LVOT Vmax:         98.90 cm/s LVOT Vmean:        63.800 cm/s LVOT VTI:          0.210 m LVOT/AV VTI ratio: 0.55  AORTA Ao Root diam: 2.90 cm Ao Asc diam:  3.50 cm MITRAL VALVE                TRICUSPID VALVE MV Area (PHT): 4.89 cm     TR Peak grad:   36.0 mmHg MV Decel Time: 155 msec     TR Vmax:        300.00 cm/s MV E velocity: 138.00 cm/s MV A velocity: 114.00 cm/s  SHUNTS MV E/A ratio:  1.21         Systemic VTI:  0.21 m                             Systemic Diam: 2.00 cm Jackquelyn Mass MD Electronically signed by Jackquelyn Mass MD Signature Date/Time: 04/17/2023/2:06:18 PM    Final    CT Angio Chest PE W and/or Wo Contrast Result Date: 04/16/2023 CLINICAL DATA:  Pulmonary embolus suspected with high probability. History of asthma. Cold-like symptoms for a week. Shortness of breath for 3 days. EXAM: CT ANGIOGRAPHY CHEST WITH CONTRAST TECHNIQUE: Multidetector CT imaging of the chest was performed using the standard protocol during bolus administration of intravenous contrast. Multiplanar CT image reconstructions and MIPs were obtained to evaluate the vascular anatomy. RADIATION DOSE REDUCTION: This exam was performed according to the departmental dose-optimization program which includes automated exposure control, adjustment of the mA and/or kV according to patient size and/or use of iterative reconstruction technique. CONTRAST:  75mL OMNIPAQUE  IOHEXOL  350 MG/ML SOLN COMPARISON:  CT chest abdomen and pelvis 11/23/2022 FINDINGS:  Cardiovascular: Technically adequate study with moderately good opacification of the central and segmental pulmonary arteries.  Mild motion artifact. No focal filling defects are identified. No evidence of significant pulmonary embolus. Right central venous catheter with tip in the low SVC. Mild cardiac enlargement. No pericardial effusions. Normal caliber thoracic aorta. No aortic dissection. Calcification of the aorta and coronary arteries. Mediastinum/Nodes: Thyroid  gland is unremarkable. Esophagus is decompressed. Mediastinal lymph nodes are not pathologically enlarged, likely reactive. Lungs/Pleura: Small bilateral pleural effusions with basilar atelectasis. Patchy airspace disease throughout both lungs with bronchial wall thickening likely representing multifocal pneumonia or possibly aspiration. Underlying airways disease may be present. Upper Abdomen: No acute abnormalities demonstrated. Musculoskeletal: Degenerative changes in the spine. No acute bony abnormalities. Review of the MIP images confirms the above findings. IMPRESSION: 1. No evidence of significant pulmonary embolus. 2. Mild cardiac enlargement. 3. Aortic atherosclerosis. 4. Small bilateral pleural effusions. 5. Patchy airspace disease throughout both lungs with bronchial wall thickening likely representing multifocal pneumonia, possibly aspiration, possibly with underlying airways disease. Electronically Signed   By: Boyce Byes M.D.   On: 04/16/2023 18:11   DG Chest 2 View Result Date: 04/16/2023 CLINICAL DATA:  Shortness of breath for 3 days, worse with ambulation. Cold-like symptoms last week. Hemodialysis patient. EXAM: CHEST - 2 VIEW COMPARISON:  Radiographs 08/23/2022 and 12/21/2020.  CT 11/23/2022. FINDINGS: Right IJ hemodialysis catheter projects over the upper right atrium. The heart size and mediastinal contours are stable. Increased mildly heterogeneous perihilar opacities in both lungs compared with prior radiographs. No  confluent airspace disease or significant pleural effusion identified. There is chronic scarring in the lingula. No pneumothorax. The bones appear unchanged. IMPRESSION: Increased mildly heterogeneous perihilar opacities in both lungs, possibly mild edema or atypical infection. No confluent airspace disease or significant pleural effusion. Short-term radiographic follow-up recommended if the patient remains symptomatic. Electronically Signed   By: Elmon Hagedorn M.D.   On: 04/16/2023 13:40      Subjective: Patient seen and examined the bedside today.  Hemodynamically stable.  She had some GERD symptoms this morning which improved after she was given Maalox.  She has a bed at skilled  nursing facility.  Medically stable for discharge  Discharge Exam: Vitals:   04/25/23 1103 04/25/23 1108  BP:  (!) 109/48  Pulse: 62 (!) 59  Resp: 19 16  Temp: 98.3 F (36.8 C)   SpO2: 94% 91%   Vitals:   04/25/23 0831 04/25/23 1049 04/25/23 1103 04/25/23 1108  BP: (!) 122/45 (!) 119/45  (!) 109/48  Pulse: (!) 53 61 62 (!) 59  Resp: 20  19 16   Temp: 98.1 F (36.7 C)  98.3 F (36.8 C)   TempSrc: Oral  Oral   SpO2: 96%  94% 91%  Weight:      Height:        General: Pt is alert, awake, not in acute distress Cardiovascular: RRR, S1/S2 +, no rubs, no gallops Respiratory: CTA bilaterally, no wheezing, no rhonchi Abdominal: Soft, NT, ND, bowel sounds + Extremities: no edema, no cyanosis    The results of significant diagnostics from this hospitalization (including imaging, microbiology, ancillary and laboratory) are listed below for reference.     Microbiology: Recent Results (from the past 240 hours)  Resp panel by RT-PCR (RSV, Flu A&B, Covid) Anterior Nasal Swab     Status: Abnormal   Collection Time: 04/16/23 12:30 PM   Specimen: Anterior Nasal Swab  Result Value Ref Range Status   SARS Coronavirus 2 by RT PCR NEGATIVE NEGATIVE Final    Comment: (NOTE) SARS-CoV-2 target nucleic acids are  NOT DETECTED.  The SARS-CoV-2 RNA is generally detectable in upper respiratory specimens during the acute phase of infection. The lowest concentration of SARS-CoV-2 viral copies this assay can detect is 138 copies/mL. A negative result does not preclude SARS-Cov-2 infection and should not be used as the sole basis for treatment or other patient management decisions. A negative result may occur with  improper specimen collection/handling, submission of specimen other than nasopharyngeal swab, presence of viral mutation(s) within the areas targeted by this assay, and inadequate number of viral copies(<138 copies/mL). A negative result must be combined with clinical observations, patient history, and epidemiological information. The expected result is Negative.  Fact Sheet for Patients:  BloggerCourse.com  Fact Sheet for Healthcare Providers:  SeriousBroker.it  This test is no t yet approved or cleared by the [place]  FDA and  has been authorized for detection and/or diagnosis of SARS-CoV-2 by FDA under an Emergency Use Authorization (EUA). This EUA will remain  in effect (meaning this test can be used) for the duration of the COVID-19 declaration under Section 564(b)(1) of the Act, 21 U.S.C.section 360bbb-3(b)(1), unless the authorization is terminated  or revoked sooner.       Influenza A by PCR POSITIVE (A) NEGATIVE Final   Influenza B by PCR NEGATIVE NEGATIVE Final    Comment: (NOTE) The Xpert Xpress SARS-CoV-2/FLU/RSV plus assay is intended as an aid in the diagnosis of influenza from Nasopharyngeal swab specimens and should not be used as a sole basis for treatment. Nasal washings and aspirates are unacceptable for Xpert Xpress SARS-CoV-2/FLU/RSV testing.  Fact Sheet for Patients: BloggerCourse.com  Fact Sheet for Healthcare Providers: SeriousBroker.it  This test is  not yet approved or cleared by the [place]  FDA and has been authorized for detection and/or diagnosis of SARS-CoV-2 by FDA under an Emergency Use Authorization (EUA). This EUA will remain in effect (meaning this test can be used) for the duration of the COVID-19 declaration under Section 564(b)(1) of the Act, 21 U.S.C. section 360bbb-3(b)(1), unless the authorization is terminated or revoked.     Resp Syncytial Virus by PCR NEGATIVE NEGATIVE Final    Comment: (NOTE) Fact Sheet for Patients: BloggerCourse.com  Fact Sheet for Healthcare Providers: SeriousBroker.it  This test is not yet approved or cleared by the [place]  FDA and has been authorized for detection and/or diagnosis of SARS-CoV-2 by FDA under an Emergency Use Authorization (EUA). This EUA will remain in effect (meaning this test can be used) for the duration of the COVID-19 declaration under Section 564(b)(1) of the Act, 21 U.S.C. section 360bbb-3(b)(1), unless the authorization is terminated or revoked.  Performed at [facility], [address] [place], [place] [address]   MRSA Next Gen by PCR, Nasal     Status: Abnormal   Collection Time: 04/16/23 10:43 PM   Specimen: Nasal Mucosa; Nasal Swab  Result Value Ref Range Status   MRSA by PCR Next Gen DETECTED (A) NOT DETECTED Final    Comment: RESULT CALLED TO, READ BACK BY AND VERIFIED WITH: TONYA,RN@[id number]  04/17/23 MK (NOTE) The GeneXpert MRSA Assay (FDA approved for NASAL specimens only), is one component of a comprehensive MRSA colonization surveillance program. It is not intended to diagnose MRSA infection nor to guide or monitor treatment for MRSA infections. Test performance is not FDA approved in patients less than 76 years old. Performed at [facility], [address], [place], [place] [address]      Labs: BNP (last 3 results) Recent Labs  04/16/23 1230  BNP  3,699.7*   Basic Metabolic Panel: Recent Labs  Lab 04/19/23 1309 04/20/23 0926 04/21/23 0210 04/22/23 0219 04/23/23 0223 04/23/23 1256 04/23/23 1300 04/24/23 0216  NA 135 135 133* 140 138 136  135 134* 139  K 4.3 4.5 3.9 3.2* 3.5 3.8  3.7 3.7 3.9  CL 96* 92* 96* 99 98  --   --  102  CO2 25 27 20* 26 21*  --   --  23  GLUCOSE 185* 113* 103* 106* 126*  --   --  141*  BUN 46* 31* 51* 36* 60*  --   --  76*  CREATININE 6.45* 4.18* 5.44* 4.18* 6.41*  --   --  8.35*  CALCIUM  9.3 9.6 9.0 9.3 9.4  --   --  9.7  MG 2.3  --   --  2.1 1.9  --   --   --   PHOS  --   --   --  3.1 4.1  --   --   --    Liver Function Tests: No results for input(s): "AST", "ALT", "ALKPHOS", "BILITOT", "PROT", "ALBUMIN" in the last 168 hours. No results for input(s): "LIPASE", "AMYLASE" in the last 168 hours. No results for input(s): "AMMONIA" in the last 168 hours. CBC: Recent Labs  Lab 04/21/23 0210 04/22/23 0219 04/23/23 0223 04/23/23 1256 04/23/23 1300 04/24/23 0216 04/25/23 0231  WBC 14.2* 12.5* 13.8*  --   --  15.9* 14.1*  HGB 9.6* 11.1* 10.3* 10.5*  10.5* 10.2* 10.7* 11.1*  HCT 29.2* 33.2* 31.6* 31.0*  31.0* 30.0* 31.9* 32.5*  MCV 95.4 93.3 95.5  --   --  93.5 92.9  PLT 316 382 322  --   --  313 282   Cardiac Enzymes: No results for input(s): "CKTOTAL", "CKMB", "CKMBINDEX", "TROPONINI" in the last 168 hours. BNP: Invalid input(s): "POCBNP" CBG: No results for input(s): "GLUCAP" in the last 168 hours. D-Dimer No results for input(s): "DDIMER" in the last 72 hours. Hgb A1c No results for input(s): "HGBA1C" in the last 72 hours. Lipid Profile No results for input(s): "CHOL", "HDL", "LDLCALC", "TRIG", "CHOLHDL", "LDLDIRECT" in the last 72 hours. Thyroid  function studies No results for input(s): "TSH", "T4TOTAL", "T3FREE", "THYROIDAB" in the last 72 hours.  Invalid input(s): "FREET3" Anemia work up No results for input(s): "VITAMINB12", "FOLATE", "FERRITIN", "TIBC", "IRON",  "RETICCTPCT" in the last 72 hours. Urinalysis    Component Value Date/Time   COLORURINE YELLOW 10/16/2022 1340   APPEARANCEUR CLOUDY (A) 10/16/2022 1340   LABSPEC 1.016 10/16/2022 1340   PHURINE 5.0 10/16/2022 1340   GLUCOSEU NEGATIVE 10/16/2022 1340   HGBUR SMALL (A) 10/16/2022 1340   BILIRUBINUR NEGATIVE 10/16/2022 1340   KETONESUR NEGATIVE 10/16/2022 1340   PROTEINUR >=300 (A) 10/16/2022 1340   NITRITE NEGATIVE 10/16/2022 1340   LEUKOCYTESUR MODERATE (A) 10/16/2022 1340   Sepsis Labs Recent Labs  Lab 04/22/23 0219 04/23/23 0223 04/24/23 0216 04/25/23 0231  WBC 12.5* 13.8* 15.9* 14.1*   Microbiology Recent Results (from the past 240 hours)  Resp panel by RT-PCR (RSV, Flu A&B, Covid) Anterior Nasal Swab     Status: Abnormal   Collection Time: 04/16/23 12:30 PM   Specimen: Anterior Nasal Swab  Result Value Ref Range Status   SARS Coronavirus 2 by RT PCR NEGATIVE NEGATIVE Final    Comment: (NOTE) SARS-CoV-2 target nucleic acids are NOT DETECTED.  The SARS-CoV-2 RNA is generally detectable in upper respiratory specimens during the acute phase of infection. The  lowest concentration of SARS-CoV-2 viral copies this assay can detect is 138 copies/mL. A negative result does not preclude SARS-Cov-2 infection and should not be used as the sole basis for treatment or other patient management decisions. A negative result may occur with  improper specimen collection/handling, submission of specimen other than nasopharyngeal swab, presence of viral mutation(s) within the areas targeted by this assay, and inadequate number of viral copies(<138 copies/mL). A negative result must be combined with clinical observations, patient history, and epidemiological information. The expected result is Negative.  Fact Sheet for Patients:  BloggerCourse.com  Fact Sheet for Healthcare Providers:  SeriousBroker.it  This test is no t yet approved  or cleared by the [place]  FDA and  has been authorized for detection and/or diagnosis of SARS-CoV-2 by FDA under an Emergency Use Authorization (EUA). This EUA will remain  in effect (meaning this test can be used) for the duration of the COVID-19 declaration under Section 564(b)(1) of the Act, 21 U.S.C.section 360bbb-3(b)(1), unless the authorization is terminated  or revoked sooner.       Influenza A by PCR POSITIVE (A) NEGATIVE Final   Influenza B by PCR NEGATIVE NEGATIVE Final    Comment: (NOTE) The Xpert Xpress SARS-CoV-2/FLU/RSV plus assay is intended as an aid in the diagnosis of influenza from Nasopharyngeal swab specimens and should not be used as a sole basis for treatment. Nasal washings and aspirates are unacceptable for Xpert Xpress SARS-CoV-2/FLU/RSV testing.  Fact Sheet for Patients: BloggerCourse.com  Fact Sheet for Healthcare Providers: SeriousBroker.it  This test is not yet approved or cleared by the [place]  FDA and has been authorized for detection and/or diagnosis of SARS-CoV-2 by FDA under an Emergency Use Authorization (EUA). This EUA will remain in effect (meaning this test can be used) for the duration of the COVID-19 declaration under Section 564(b)(1) of the Act, 21 U.S.C. section 360bbb-3(b)(1), unless the authorization is terminated or revoked.     Resp Syncytial Virus by PCR NEGATIVE NEGATIVE Final    Comment: (NOTE) Fact Sheet for Patients: BloggerCourse.com  Fact Sheet for Healthcare Providers: SeriousBroker.it  This test is not yet approved or cleared by the [place]  FDA and has been authorized for detection and/or diagnosis of SARS-CoV-2 by FDA under an Emergency Use Authorization (EUA). This EUA will remain in effect (meaning this test can be used) for the duration of the COVID-19 declaration under Section 564(b)(1) of the  Act, 21 U.S.C. section 360bbb-3(b)(1), unless the authorization is terminated or revoked.  Performed at [facility], [address] [place], [place] [address]   MRSA Next Gen by PCR, Nasal     Status: Abnormal   Collection Time: 04/16/23 10:43 PM   Specimen: Nasal Mucosa; Nasal Swab  Result Value Ref Range Status   MRSA by PCR Next Gen DETECTED (A) NOT DETECTED Final    Comment: RESULT CALLED TO, READ BACK BY AND VERIFIED WITH: TONYA,RN@0049  04/17/23 MK (NOTE) The GeneXpert MRSA Assay (FDA approved for NASAL specimens only), is one component of a comprehensive MRSA colonization surveillance program. It is not intended to diagnose MRSA infection nor to guide or monitor treatment for MRSA infections. Test performance is not FDA approved in patients less than 60 years old. Performed at [facility], [address] [place], [place] [address]     Please note: You were cared for by a hospitalist during your hospital stay. Once you are discharged, your primary care physician will handle any further medical issues. Please note  that NO REFILLS for any discharge medications will be authorized once you are discharged, as it is imperative that you return to your primary care physician (or establish a relationship with a primary care physician if you do not have one) for your post hospital discharge needs so that they can reassess your need for medications and monitor your lab values.    Time coordinating discharge: 40 minutes  SIGNED:   Leona Rake, MD  [facility] 04/25/2023, 12:52 PM Pager [phone]  If 7PM-7AM, please contact night-coverage [email or web address] Password [email or web address]

## 2023-04-25 NOTE — Progress Notes (Signed)
 D/C order noted. Contacted [facility] to be advised of pt's d/c to snf today and that pt should resume care tomorrow. Clinic provided snf name as well.   Lauraine Polite Renal Navigator [phone]

## 2023-04-26 DIAGNOSIS — N186 End stage renal disease: Secondary | ICD-10-CM | POA: Diagnosis not present

## 2023-04-26 DIAGNOSIS — Z4901 Encounter for fitting and adjustment of extracorporeal dialysis catheter: Secondary | ICD-10-CM | POA: Diagnosis not present

## 2023-04-26 DIAGNOSIS — R52 Pain, unspecified: Secondary | ICD-10-CM | POA: Diagnosis not present

## 2023-04-26 DIAGNOSIS — I4891 Unspecified atrial fibrillation: Secondary | ICD-10-CM | POA: Diagnosis not present

## 2023-04-26 DIAGNOSIS — N2581 Secondary hyperparathyroidism of renal origin: Secondary | ICD-10-CM | POA: Diagnosis not present

## 2023-04-26 DIAGNOSIS — I214 Non-ST elevation (NSTEMI) myocardial infarction: Secondary | ICD-10-CM | POA: Diagnosis not present

## 2023-04-26 DIAGNOSIS — D689 Coagulation defect, unspecified: Secondary | ICD-10-CM | POA: Diagnosis not present

## 2023-04-26 DIAGNOSIS — J9601 Acute respiratory failure with hypoxia: Secondary | ICD-10-CM | POA: Diagnosis not present

## 2023-04-26 DIAGNOSIS — D509 Iron deficiency anemia, unspecified: Secondary | ICD-10-CM | POA: Diagnosis not present

## 2023-04-26 DIAGNOSIS — I1311 Hypertensive heart and chronic kidney disease without heart failure, with stage 5 chronic kidney disease, or end stage renal disease: Secondary | ICD-10-CM | POA: Diagnosis not present

## 2023-04-26 DIAGNOSIS — Z992 Dependence on renal dialysis: Secondary | ICD-10-CM | POA: Diagnosis not present

## 2023-04-27 DIAGNOSIS — I1311 Hypertensive heart and chronic kidney disease without heart failure, with stage 5 chronic kidney disease, or end stage renal disease: Secondary | ICD-10-CM | POA: Diagnosis not present

## 2023-04-27 DIAGNOSIS — I214 Non-ST elevation (NSTEMI) myocardial infarction: Secondary | ICD-10-CM | POA: Diagnosis not present

## 2023-04-27 DIAGNOSIS — J4530 Mild persistent asthma, uncomplicated: Secondary | ICD-10-CM | POA: Diagnosis not present

## 2023-04-27 DIAGNOSIS — I4891 Unspecified atrial fibrillation: Secondary | ICD-10-CM | POA: Diagnosis not present

## 2023-04-27 DIAGNOSIS — I480 Paroxysmal atrial fibrillation: Secondary | ICD-10-CM | POA: Diagnosis not present

## 2023-04-27 DIAGNOSIS — M6281 Muscle weakness (generalized): Secondary | ICD-10-CM | POA: Diagnosis not present

## 2023-04-27 DIAGNOSIS — Z741 Need for assistance with personal care: Secondary | ICD-10-CM | POA: Diagnosis not present

## 2023-04-27 DIAGNOSIS — J9601 Acute respiratory failure with hypoxia: Secondary | ICD-10-CM | POA: Diagnosis not present

## 2023-04-27 DIAGNOSIS — R2681 Unsteadiness on feet: Secondary | ICD-10-CM | POA: Diagnosis not present

## 2023-04-27 DIAGNOSIS — N186 End stage renal disease: Secondary | ICD-10-CM | POA: Diagnosis not present

## 2023-04-28 DIAGNOSIS — Z992 Dependence on renal dialysis: Secondary | ICD-10-CM | POA: Diagnosis not present

## 2023-04-28 DIAGNOSIS — R52 Pain, unspecified: Secondary | ICD-10-CM | POA: Diagnosis not present

## 2023-04-28 DIAGNOSIS — N186 End stage renal disease: Secondary | ICD-10-CM | POA: Diagnosis not present

## 2023-04-28 DIAGNOSIS — D509 Iron deficiency anemia, unspecified: Secondary | ICD-10-CM | POA: Diagnosis not present

## 2023-04-28 DIAGNOSIS — Z4901 Encounter for fitting and adjustment of extracorporeal dialysis catheter: Secondary | ICD-10-CM | POA: Diagnosis not present

## 2023-04-28 DIAGNOSIS — N2581 Secondary hyperparathyroidism of renal origin: Secondary | ICD-10-CM | POA: Diagnosis not present

## 2023-04-28 DIAGNOSIS — D689 Coagulation defect, unspecified: Secondary | ICD-10-CM | POA: Diagnosis not present

## 2023-04-30 ENCOUNTER — Ambulatory Visit: Payer: Medicare Other | Admitting: Nurse Practitioner

## 2023-05-01 ENCOUNTER — Other Ambulatory Visit: Payer: Self-pay | Admitting: *Deleted

## 2023-05-01 DIAGNOSIS — N186 End stage renal disease: Secondary | ICD-10-CM | POA: Diagnosis not present

## 2023-05-01 DIAGNOSIS — N2581 Secondary hyperparathyroidism of renal origin: Secondary | ICD-10-CM | POA: Diagnosis not present

## 2023-05-01 DIAGNOSIS — I214 Non-ST elevation (NSTEMI) myocardial infarction: Secondary | ICD-10-CM | POA: Diagnosis not present

## 2023-05-01 DIAGNOSIS — D509 Iron deficiency anemia, unspecified: Secondary | ICD-10-CM | POA: Diagnosis not present

## 2023-05-01 DIAGNOSIS — I1311 Hypertensive heart and chronic kidney disease without heart failure, with stage 5 chronic kidney disease, or end stage renal disease: Secondary | ICD-10-CM | POA: Diagnosis not present

## 2023-05-01 DIAGNOSIS — R52 Pain, unspecified: Secondary | ICD-10-CM | POA: Diagnosis not present

## 2023-05-01 DIAGNOSIS — J9601 Acute respiratory failure with hypoxia: Secondary | ICD-10-CM | POA: Diagnosis not present

## 2023-05-01 DIAGNOSIS — I4891 Unspecified atrial fibrillation: Secondary | ICD-10-CM | POA: Diagnosis not present

## 2023-05-01 DIAGNOSIS — D689 Coagulation defect, unspecified: Secondary | ICD-10-CM | POA: Diagnosis not present

## 2023-05-01 DIAGNOSIS — Z992 Dependence on renal dialysis: Secondary | ICD-10-CM | POA: Diagnosis not present

## 2023-05-01 DIAGNOSIS — Z4901 Encounter for fitting and adjustment of extracorporeal dialysis catheter: Secondary | ICD-10-CM | POA: Diagnosis not present

## 2023-05-01 NOTE — Patient Outreach (Signed)
Ms. Dominey resides in [facility]. Screening for potential care coordination services as benefit of health plan and PCP.  Secure communication sent to [facility]  to make aware writer is following. Will plan to send discharge notification to [facility] care management team.   Will continue to follow.  Raiford Noble, MSN, RN, BSN [place]  [facility] RN Post- Acute Care Manager Direct Dial: [phone]

## 2023-05-02 DIAGNOSIS — I1311 Hypertensive heart and chronic kidney disease without heart failure, with stage 5 chronic kidney disease, or end stage renal disease: Secondary | ICD-10-CM | POA: Diagnosis not present

## 2023-05-02 DIAGNOSIS — I4891 Unspecified atrial fibrillation: Secondary | ICD-10-CM | POA: Diagnosis not present

## 2023-05-02 DIAGNOSIS — I214 Non-ST elevation (NSTEMI) myocardial infarction: Secondary | ICD-10-CM | POA: Diagnosis not present

## 2023-05-02 DIAGNOSIS — J9601 Acute respiratory failure with hypoxia: Secondary | ICD-10-CM | POA: Diagnosis not present

## 2023-05-02 DIAGNOSIS — N186 End stage renal disease: Secondary | ICD-10-CM | POA: Diagnosis not present

## 2023-05-03 DIAGNOSIS — N186 End stage renal disease: Secondary | ICD-10-CM | POA: Diagnosis not present

## 2023-05-03 DIAGNOSIS — R52 Pain, unspecified: Secondary | ICD-10-CM | POA: Diagnosis not present

## 2023-05-03 DIAGNOSIS — Z992 Dependence on renal dialysis: Secondary | ICD-10-CM | POA: Diagnosis not present

## 2023-05-03 DIAGNOSIS — D689 Coagulation defect, unspecified: Secondary | ICD-10-CM | POA: Diagnosis not present

## 2023-05-03 DIAGNOSIS — N2581 Secondary hyperparathyroidism of renal origin: Secondary | ICD-10-CM | POA: Diagnosis not present

## 2023-05-03 DIAGNOSIS — Z4901 Encounter for fitting and adjustment of extracorporeal dialysis catheter: Secondary | ICD-10-CM | POA: Diagnosis not present

## 2023-05-03 DIAGNOSIS — D509 Iron deficiency anemia, unspecified: Secondary | ICD-10-CM | POA: Diagnosis not present

## 2023-05-04 DIAGNOSIS — I480 Paroxysmal atrial fibrillation: Secondary | ICD-10-CM | POA: Diagnosis not present

## 2023-05-04 DIAGNOSIS — I4891 Unspecified atrial fibrillation: Secondary | ICD-10-CM | POA: Diagnosis not present

## 2023-05-04 DIAGNOSIS — I214 Non-ST elevation (NSTEMI) myocardial infarction: Secondary | ICD-10-CM | POA: Diagnosis not present

## 2023-05-04 DIAGNOSIS — M6281 Muscle weakness (generalized): Secondary | ICD-10-CM | POA: Diagnosis not present

## 2023-05-04 DIAGNOSIS — Z741 Need for assistance with personal care: Secondary | ICD-10-CM | POA: Diagnosis not present

## 2023-05-04 DIAGNOSIS — J9601 Acute respiratory failure with hypoxia: Secondary | ICD-10-CM | POA: Diagnosis not present

## 2023-05-04 DIAGNOSIS — N186 End stage renal disease: Secondary | ICD-10-CM | POA: Diagnosis not present

## 2023-05-04 DIAGNOSIS — R2681 Unsteadiness on feet: Secondary | ICD-10-CM | POA: Diagnosis not present

## 2023-05-04 DIAGNOSIS — J4530 Mild persistent asthma, uncomplicated: Secondary | ICD-10-CM | POA: Diagnosis not present

## 2023-05-04 DIAGNOSIS — I1311 Hypertensive heart and chronic kidney disease without heart failure, with stage 5 chronic kidney disease, or end stage renal disease: Secondary | ICD-10-CM | POA: Diagnosis not present

## 2023-05-05 DIAGNOSIS — D689 Coagulation defect, unspecified: Secondary | ICD-10-CM | POA: Diagnosis not present

## 2023-05-05 DIAGNOSIS — N186 End stage renal disease: Secondary | ICD-10-CM | POA: Diagnosis not present

## 2023-05-05 DIAGNOSIS — Z992 Dependence on renal dialysis: Secondary | ICD-10-CM | POA: Diagnosis not present

## 2023-05-05 DIAGNOSIS — N2581 Secondary hyperparathyroidism of renal origin: Secondary | ICD-10-CM | POA: Diagnosis not present

## 2023-05-05 DIAGNOSIS — D509 Iron deficiency anemia, unspecified: Secondary | ICD-10-CM | POA: Diagnosis not present

## 2023-05-05 DIAGNOSIS — Z4901 Encounter for fitting and adjustment of extracorporeal dialysis catheter: Secondary | ICD-10-CM | POA: Diagnosis not present

## 2023-05-05 DIAGNOSIS — R52 Pain, unspecified: Secondary | ICD-10-CM | POA: Diagnosis not present

## 2023-05-07 DIAGNOSIS — Z7901 Long term (current) use of anticoagulants: Secondary | ICD-10-CM | POA: Diagnosis not present

## 2023-05-07 DIAGNOSIS — Z7982 Long term (current) use of aspirin: Secondary | ICD-10-CM | POA: Diagnosis not present

## 2023-05-07 DIAGNOSIS — I1311 Hypertensive heart and chronic kidney disease without heart failure, with stage 5 chronic kidney disease, or end stage renal disease: Secondary | ICD-10-CM | POA: Diagnosis not present

## 2023-05-07 DIAGNOSIS — E785 Hyperlipidemia, unspecified: Secondary | ICD-10-CM | POA: Diagnosis not present

## 2023-05-07 DIAGNOSIS — N186 End stage renal disease: Secondary | ICD-10-CM | POA: Diagnosis not present

## 2023-05-07 DIAGNOSIS — I214 Non-ST elevation (NSTEMI) myocardial infarction: Secondary | ICD-10-CM | POA: Diagnosis not present

## 2023-05-07 DIAGNOSIS — D500 Iron deficiency anemia secondary to blood loss (chronic): Secondary | ICD-10-CM | POA: Diagnosis not present

## 2023-05-07 DIAGNOSIS — Z853 Personal history of malignant neoplasm of breast: Secondary | ICD-10-CM | POA: Diagnosis not present

## 2023-05-07 DIAGNOSIS — J9601 Acute respiratory failure with hypoxia: Secondary | ICD-10-CM | POA: Diagnosis not present

## 2023-05-07 DIAGNOSIS — C50911 Malignant neoplasm of unspecified site of right female breast: Secondary | ICD-10-CM | POA: Diagnosis not present

## 2023-05-07 DIAGNOSIS — I480 Paroxysmal atrial fibrillation: Secondary | ICD-10-CM | POA: Diagnosis not present

## 2023-05-07 DIAGNOSIS — Z992 Dependence on renal dialysis: Secondary | ICD-10-CM | POA: Diagnosis not present

## 2023-05-08 DIAGNOSIS — Z992 Dependence on renal dialysis: Secondary | ICD-10-CM | POA: Diagnosis not present

## 2023-05-08 DIAGNOSIS — N2581 Secondary hyperparathyroidism of renal origin: Secondary | ICD-10-CM | POA: Diagnosis not present

## 2023-05-08 DIAGNOSIS — Z4901 Encounter for fitting and adjustment of extracorporeal dialysis catheter: Secondary | ICD-10-CM | POA: Diagnosis not present

## 2023-05-08 DIAGNOSIS — D689 Coagulation defect, unspecified: Secondary | ICD-10-CM | POA: Diagnosis not present

## 2023-05-08 DIAGNOSIS — D509 Iron deficiency anemia, unspecified: Secondary | ICD-10-CM | POA: Diagnosis not present

## 2023-05-08 DIAGNOSIS — R52 Pain, unspecified: Secondary | ICD-10-CM | POA: Diagnosis not present

## 2023-05-08 DIAGNOSIS — N186 End stage renal disease: Secondary | ICD-10-CM | POA: Diagnosis not present

## 2023-05-09 ENCOUNTER — Encounter: Payer: Self-pay | Admitting: Physician Assistant

## 2023-05-09 ENCOUNTER — Ambulatory Visit (INDEPENDENT_AMBULATORY_CARE_PROVIDER_SITE_OTHER): Payer: Medicare Other | Admitting: Physician Assistant

## 2023-05-09 VITALS — BP 145/70 | HR 63 | Temp 97.4°F | Resp 18 | Ht 62.0 in | Wt 140.4 lb

## 2023-05-09 DIAGNOSIS — N186 End stage renal disease: Secondary | ICD-10-CM

## 2023-05-09 NOTE — Progress Notes (Signed)
POST OPERATIVE OFFICE NOTE    CC:  F/u for surgery  HPI:  This is a 78 y.o. female who is s/p revision  right arm cephalic vein fistula with branch ligation and superficialization on 04/13/23 by Dr. Randie Heinz.    Pt returns today for follow up.  Pt states she is pleased with her care here and feels like her fistula is doing well.  She denies symptoms of steal.  No pain, loss of motor or loss of sensation.  Her incisions have healed well.     Allergies  Allergen Reactions   Ace Inhibitors     angioedema   Lactose Intolerance (Gi) Other (See Comments)    GI upset   Letrozole Hives   Lisinopril Swelling   Mercury     Red eyes, through eye drops that contained thimerosal    Tape Dermatitis    plastic    Current Outpatient Medications  Medication Sig Dispense Refill   acetaminophen (TYLENOL) 500 MG tablet Take 500 mg by mouth every 6 (six) hours as needed for mild pain (pain score 1-3).     alum & mag hydroxide-simeth (MAALOX/MYLANTA) 200-200-20 MG/5ML suspension Take 30 mLs by mouth every 6 (six) hours as needed for indigestion or heartburn.     amiodarone (PACERONE) 200 MG tablet Take 1 tablet (200 mg total) by mouth daily.     anastrozole (ARIMIDEX) 1 MG tablet Take 1 tablet (1 mg total) by mouth daily. 90 tablet 3   apixaban (ELIQUIS) 5 MG TABS tablet Take 1 tablet (5 mg total) by mouth 2 (two) times daily.     aspirin EC 81 MG tablet Take 1 tablet (81 mg total) by mouth daily. Swallow whole.     atorvastatin (LIPITOR) 80 MG tablet Take 1 tablet (80 mg total) by mouth in the morning.     budesonide (RHINOCORT AQUA) 32 MCG/ACT nasal spray Place 2 sprays into both nostrils in the morning.     Cholecalciferol 50 MCG (2000 UT) CAPS Take 2,000 Units by mouth in the morning.     diclofenac Sodium (VOLTAREN) 1 % GEL Apply 1 Application topically 4 (four) times daily as needed (pain.).     eszopiclone (LUNESTA) 2 MG TABS tablet Take 2 mg by mouth at bedtime.     febuxostat (ULORIC) 40 MG  tablet Take 40 mg by mouth in the morning.     loratadine (CLARITIN) 10 MG tablet Take 10 mg by mouth 2 (two) times daily.     metoprolol succinate (TOPROL-XL) 100 MG 24 hr tablet Take 1 tablet (100 mg total) by mouth daily. Take with or immediately following a meal.     Multiple Vitamins-Minerals (PRESERVISION AREDS 2+MULTI VIT PO) Take 1 capsule by mouth 2 (two) times daily.     pantoprazole (PROTONIX) 40 MG tablet Take 40 mg by mouth daily.     polyethylene glycol (MIRALAX / GLYCOLAX) 17 g packet Take 17 g by mouth daily.     PROAIR HFA 108 (90 BASE) MCG/ACT inhaler Inhale 1-2 puffs into the lungs every 6 (six) hours as needed for shortness of breath or wheezing (for respiratory issues.).     rOPINIRole (REQUIP) 0.5 MG tablet Take 1 tablet (0.5 mg total) by mouth every 12 (twelve) hours as needed (Restless legs).     senna-docusate (SENOKOT-S) 8.6-50 MG tablet Take 2 tablets by mouth 2 (two) times daily.     sevelamer carbonate (RENVELA) 800 MG tablet Take 800 mg by mouth 3 (three) times daily  with meals.     traZODone (DESYREL) 50 MG tablet Take 0.5 tablets (25 mg total) by mouth at bedtime.     No current facility-administered medications for this visit.     ROS:  See HPI  Physical Exam:    Incision:  well healed Extremities:  palpable thrill in the fistula,  radial pulse easily palpable Neuro: sensation grossly intact     Assessment/Plan:  This is a 78 y.o. female who is s/p:revision of the right UE AV fistula superficialization and branch ligation.     -The fistula need 4 weeks to fully heal from revision before it can be accessed.  F/U PRN.  Good thrill and palpable radial pulse.  05/14/23 or later the fistula may be accessed.     Mosetta Pigeon PA-C [facility] [phone]   Clinic MD:  Randie Heinz

## 2023-05-09 NOTE — Progress Notes (Unsigned)
Cardiology Office Note    Patient Name: Rachel Villarreal Date of Encounter: 05/10/2023  Primary Care Provider:  Georgann Housekeeper, MD Primary Cardiologist:  Christell Constant, MD Primary Electrophysiologist: None   Past Medical History    Past Medical History:  Diagnosis Date   Abnormal Pap smear of vagina    Anemia    Asthma    Mild   Cancer (HCC)    left breast ILC   Chronic kidney disease (CKD)    stage 4   GERD (gastroesophageal reflux disease)    Gout 05/2012   Hernia, incisional    History of abnormal Pap smear    CIN III, 2001   History of blood transfusion    POST OP   History of uterine fibroid    Hypertension    Peritonitis (HCC)    Respiratory failure (HCC)    after ruptured appendix   Ruptured appendix    Seasonal allergies    Shingles 02/2013   very mild case   Urine incontinence     History of Present Illness  Rachel Villarreal is a 78 y.o. female with a PMH of CAD s/p NSTEMI with LHC showing single-vessel disease in mid circumflex at 85% treated medically, HFrEF, paroxysmal AF, essential HTN, HLD, breast CA, GERD, asthma ESRD (Tuesday, Thursday, Saturday) who presents today for posthospital follow-up.  Rachel Villarreal was admitted to the ED on 04/16/2023 with complaint of respiratory infection symptoms with cough and shortness of breath.  She initially presented to be hypoxic and placed on 2 L nasal cannula initially showing sinus rhythm with PVCs and subsequently showing atrial flutter with RVR.  Labs were completed and notable for troponin 5456 -> 4,458, BNP 3699.  She was found to be positive for influenza and started on heparin and transferred to [facility]  2D echo was completed moderately decreased EF of 35 as 40% with mild concentric LVH mildly reduced RV function with moderately enlarged RV, and pleural effusions.  She was started on anticoagulation following her cath and transition to p.o. amiodarone for rate control.  She was started on empiric  antibiotics and with improvement pursued with [facility] for further evaluation.  Cath results showed proximal LAD to mid LAD 85% stenosed mid circumflex lesion treated medically as well as RCA to distal RCA lesion of 20%.  She had normal right heart pressures with normal LVEDP.  She was discharged with Eliquis and ASA 81 mg with plan to discontinue ASA in 30 days.  She was discharged to skilled nursing facility.  She had follow-up visit for fistula revision by Dr. Randie Heinz yesterday.  Rachel Villarreal presents today with her family member for posthospital follow-up. The patient was previously admitted due to respiratory infection and atrial flutter. The patient was treated with medications and transferred to [facility] for further treatment. The patient underwent a heart catheterization which showed some blockage in the proximal part of the left artery in the heart. The patient was treated medically for the blockage. The patient is currently on medications including amiodarone, metoprolol, aspirin, and Eliquis. The patient has been experiencing episodes of increased heart rate. The patient also has a history of asthma. The patient reports no chest pain or discomfort. The patient has been experiencing episodes of increased heart rate, which have been concerning. The patient reports that these episodes last for two to three hours.   Patient denies chest pain, palpitations, dyspnea, PND, orthopnea, nausea, vomiting, dizziness, syncope, edema, weight gain, or early satiety.  Review of Systems  Please see the history of present illness.    All other systems reviewed and are otherwise negative except as noted above.  Physical Exam    Wt Readings from Last 3 Encounters:  05/10/23 142 lb 9.6 oz (64.7 kg)  05/09/23 140 lb 6.4 oz (63.7 kg)  04/24/23 148 lb 13 oz (67.5 kg)   VS: Vitals:   05/10/23 1045  BP: (!) 130/58  Pulse: 61  SpO2: 94%  ,Body mass index is 26.08 kg/m. GEN: Well nourished, well developed in no  acute distress Neck: No JVD; No carotid bruits Pulmonary: Clear to auscultation without rales, wheezing or rhonchi  Cardiovascular: Normal rate. Regular rhythm. Normal S1. Normal S2.   Murmurs: There is no murmur.  ABDOMEN: Soft, non-tender, non-distended EXTREMITIES:  No edema; No deformity   EKG/LABS/ Recent Cardiac Studies   ECG personally reviewed by me today -sinus rhythm with a rate of 61 bpm and no acute changes consistent with previous EKG.  Risk Assessment/Calculations:    CHA2DS2-VASc Score = 6   This indicates a 9.7% annual risk of stroke. The patient's score is based upon: CHF History: 1 HTN History: 1 Diabetes History: 0 Stroke History: 0 Vascular Disease History: 1 Age Score: 2 Gender Score: 1         Lab Results  Component Value Date   WBC 14.1 (H) 04/25/2023   HGB 11.1 (L) 04/25/2023   HCT 32.5 (L) 04/25/2023   MCV 92.9 04/25/2023   PLT 282 04/25/2023   Lab Results  Component Value Date   CREATININE 8.35 (H) 04/24/2023   BUN 76 (H) 04/24/2023   NA 139 04/24/2023   K 3.9 04/24/2023   CL 102 04/24/2023   CO2 23 04/24/2023   Lab Results  Component Value Date   CHOL 137 04/17/2023   HDL 36 (L) 04/17/2023   LDLCALC 79 04/17/2023   TRIG 108 04/17/2023   CHOLHDL 3.8 04/17/2023    Lab Results  Component Value Date   HGBA1C 5.2 04/17/2023   Assessment & Plan    1.  Coronary artery disease: -Recent heart catheterization showed blockage in the pLAD to mid LAD 85% stenosed mid circumflex lesion treated medically as well as RCA to distal RCA lesion of 20%. -Currently on Eliquis and aspirin. Discussed plan to stop aspirin in 30 days due to increased risk of bleeding with dual antiplatelet therapy. -Stop aspirin in 30 days. -Continue Eliquis 5 mg twice daily.  2.  Paroxysmal AF: -Recent hospitalization for atrial flutter with rapid ventricular rate. Currently in normal sinus rhythm on amiodarone 200 mg daily and Toprol-XL 100 mg daily.  -Order labs  to monitor thyroid and liver function due to potential side effects of amiodarone. -Consider discontinuing amiodarone in a few months if heart rhythm remains stable. -Plan for event monitor in a few months to assess for recurrence of atrial flutter. -CHA2DS2-VASc Score = 6 [CHF History: 1, HTN History: 1, Diabetes History: 0, Stroke History: 0, Vascular Disease History: 1, Age Score: 2, Gender Score: 1].  Therefore, the patient's annual risk of stroke is 9.7 %.       3.  HFrEF: -EF of 35 as 40% with mild concentric LVH mildly reduced RV function with moderately enlarged RV, and pleural effusions.  -Patient currently euvolemic with labile BP limiting GDMT titration. -Volume currently managed by dialysis -Continue Toprol-XL 100 mg daily -Plan to increase GDMT as BP allows and repeat 2D echo in 3 months. -Low sodium diet, fluid  restriction <2L, and daily weights encouraged. Educated to contact our office for weight gain of 2 lbs overnight or 5 lbs in one week.   4.  Essential hypertension: -Previously on amlodipine and losartan, but these were discontinued due to concerns about blood pressure and renal function. Discussed potential need to reintroduce these medications if blood pressure increases. -Monitor blood pressure and consider reintroduction of amlodipine or losartan if necessary.  5.  Hyperlipidemia: -Patient's LDL cholesterol was 79 above goal. -Continue atorvastatin 80 mg daily  6.  ESRD: -New AV fistula in right upper arm currently maturing with plan to access in mid February. -Continue current treatment on Monday, Wednesday, Friday per nephrology  Disposition: Follow-up with Christell Constant, MD or APP in 1 months    Signed, Napoleon Form, Leodis Rains, NP 05/10/2023, 12:48 PM [facility]

## 2023-05-10 ENCOUNTER — Encounter: Payer: Self-pay | Admitting: Nurse Practitioner

## 2023-05-10 ENCOUNTER — Ambulatory Visit: Payer: Medicare Other | Attending: Nurse Practitioner | Admitting: Nurse Practitioner

## 2023-05-10 VITALS — BP 130/58 | HR 61 | Ht 62.0 in | Wt 142.6 lb

## 2023-05-10 DIAGNOSIS — I480 Paroxysmal atrial fibrillation: Secondary | ICD-10-CM | POA: Diagnosis not present

## 2023-05-10 DIAGNOSIS — I2510 Atherosclerotic heart disease of native coronary artery without angina pectoris: Secondary | ICD-10-CM | POA: Diagnosis not present

## 2023-05-10 DIAGNOSIS — N186 End stage renal disease: Secondary | ICD-10-CM | POA: Diagnosis not present

## 2023-05-10 DIAGNOSIS — E785 Hyperlipidemia, unspecified: Secondary | ICD-10-CM | POA: Diagnosis not present

## 2023-05-10 DIAGNOSIS — I5020 Unspecified systolic (congestive) heart failure: Secondary | ICD-10-CM | POA: Diagnosis not present

## 2023-05-10 DIAGNOSIS — I10 Essential (primary) hypertension: Secondary | ICD-10-CM | POA: Diagnosis not present

## 2023-05-10 MED ORDER — METOPROLOL SUCCINATE ER 100 MG PO TB24: ORAL_TABLET | ORAL | Status: DC

## 2023-05-10 MED ORDER — METOPROLOL SUCCINATE ER 100 MG PO TB24: 50.0000 mg | ORAL_TABLET | ORAL | Status: DC | PRN

## 2023-05-10 NOTE — Patient Instructions (Signed)
Medication Instructions:  Your physician has recommended you make the following change in your medication:   TAKE Toprol Xl 100 only 1/2 tablet as needed for elevated heart rate  *If you need a refill on your cardiac medications before your next appointment, please call your pharmacy*   Lab Work: TODAY:  CMET & TSH  If you have labs (blood work) drawn today and your tests are completely normal, you will receive your results only by: MyChart Message (if you have MyChart) OR A paper copy in the mail If you have any lab test that is abnormal or we need to change your treatment, we will call you to review the results.   Testing/Procedures: None ordered   Follow-Up: At [facility], you and your health needs are our priority.  As part of our continuing mission to provide you with exceptional heart care, we have created designated Provider Care Teams.  These Care Teams include your primary Cardiologist (physician) and Advanced Practice Providers (APPs -  Physician Assistants and Nurse Practitioners) who all work together to provide you with the care you need, when you need it.  We recommend signing up for the patient portal called "MyChart".  Sign up information is provided on this After Visit Summary.  MyChart is used to connect with patients for Virtual Visits (Telemedicine).  Patients are able to view lab/test results, encounter notes, upcoming appointments, etc.  Non-urgent messages can be sent to your provider as well.   To learn more about what you can do with MyChart, go to [email or web address].    Your next appointment:   1 month(s)  Provider:   Christell Constant, MD / Robin Searing, NP    Other Instructions   1st Floor: - Lobby - Registration  - Pharmacy  - Lab - Cafe  2nd Floor: - PV Lab - Diagnostic Testing (echo, CT, nuclear med)  3rd Floor: - Vacant  4th Floor: - TCTS (cardiothoracic surgery) - AFib Clinic - Structural Heart Clinic - Vascular  Surgery  - Vascular Ultrasound  5th Floor: - HeartCare Cardiology (general and EP) - Clinical Pharmacy for coumadin, hypertension, lipid, weight-loss medications, and med management appointments    Valet parking services will be available as well.

## 2023-05-11 DIAGNOSIS — R52 Pain, unspecified: Secondary | ICD-10-CM | POA: Diagnosis not present

## 2023-05-11 DIAGNOSIS — N2581 Secondary hyperparathyroidism of renal origin: Secondary | ICD-10-CM | POA: Diagnosis not present

## 2023-05-11 DIAGNOSIS — D509 Iron deficiency anemia, unspecified: Secondary | ICD-10-CM | POA: Diagnosis not present

## 2023-05-11 DIAGNOSIS — D689 Coagulation defect, unspecified: Secondary | ICD-10-CM | POA: Diagnosis not present

## 2023-05-11 DIAGNOSIS — I120 Hypertensive chronic kidney disease with stage 5 chronic kidney disease or end stage renal disease: Secondary | ICD-10-CM | POA: Diagnosis not present

## 2023-05-11 DIAGNOSIS — Z4901 Encounter for fitting and adjustment of extracorporeal dialysis catheter: Secondary | ICD-10-CM | POA: Diagnosis not present

## 2023-05-11 DIAGNOSIS — N186 End stage renal disease: Secondary | ICD-10-CM | POA: Diagnosis not present

## 2023-05-11 DIAGNOSIS — Z992 Dependence on renal dialysis: Secondary | ICD-10-CM | POA: Diagnosis not present

## 2023-05-11 LAB — COMPREHENSIVE METABOLIC PANEL
ALT: 10 [IU]/L (ref 0–32)
AST: 14 [IU]/L (ref 0–40)
Albumin: 3.4 g/dL — ABNORMAL LOW (ref 3.8–4.8)
Alkaline Phosphatase: 137 [IU]/L — ABNORMAL HIGH (ref 44–121)
BUN/Creatinine Ratio: 7 — ABNORMAL LOW (ref 12–28)
BUN: 40 mg/dL — ABNORMAL HIGH (ref 8–27)
Bilirubin Total: 0.4 mg/dL (ref 0.0–1.2)
CO2: 24 mmol/L (ref 20–29)
Calcium: 9.1 mg/dL (ref 8.7–10.3)
Chloride: 97 mmol/L (ref 96–106)
Creatinine, Ser: 6.1 mg/dL — ABNORMAL HIGH (ref 0.57–1.00)
Globulin, Total: 2.1 g/dL (ref 1.5–4.5)
Glucose: 84 mg/dL (ref 70–99)
Potassium: 3.8 mmol/L (ref 3.5–5.2)
Sodium: 141 mmol/L (ref 134–144)
Total Protein: 5.5 g/dL — ABNORMAL LOW (ref 6.0–8.5)
eGFR: 7 mL/min/{1.73_m2} — ABNORMAL LOW (ref >59)

## 2023-05-11 LAB — TSH: TSH: 5.97 u[IU]/mL — ABNORMAL HIGH (ref 0.450–4.500)

## 2023-05-12 DIAGNOSIS — R52 Pain, unspecified: Secondary | ICD-10-CM | POA: Diagnosis not present

## 2023-05-12 DIAGNOSIS — D509 Iron deficiency anemia, unspecified: Secondary | ICD-10-CM | POA: Diagnosis not present

## 2023-05-12 DIAGNOSIS — N2581 Secondary hyperparathyroidism of renal origin: Secondary | ICD-10-CM | POA: Diagnosis not present

## 2023-05-12 DIAGNOSIS — D689 Coagulation defect, unspecified: Secondary | ICD-10-CM | POA: Diagnosis not present

## 2023-05-12 DIAGNOSIS — N186 End stage renal disease: Secondary | ICD-10-CM | POA: Diagnosis not present

## 2023-05-12 DIAGNOSIS — Z992 Dependence on renal dialysis: Secondary | ICD-10-CM | POA: Diagnosis not present

## 2023-05-12 DIAGNOSIS — Z4901 Encounter for fitting and adjustment of extracorporeal dialysis catheter: Secondary | ICD-10-CM | POA: Diagnosis not present

## 2023-05-12 DIAGNOSIS — Z23 Encounter for immunization: Secondary | ICD-10-CM | POA: Diagnosis not present

## 2023-05-13 DIAGNOSIS — I1311 Hypertensive heart and chronic kidney disease without heart failure, with stage 5 chronic kidney disease, or end stage renal disease: Secondary | ICD-10-CM | POA: Diagnosis not present

## 2023-05-13 DIAGNOSIS — Z7901 Long term (current) use of anticoagulants: Secondary | ICD-10-CM | POA: Diagnosis not present

## 2023-05-13 DIAGNOSIS — Z7982 Long term (current) use of aspirin: Secondary | ICD-10-CM | POA: Diagnosis not present

## 2023-05-13 DIAGNOSIS — Z992 Dependence on renal dialysis: Secondary | ICD-10-CM | POA: Diagnosis not present

## 2023-05-13 DIAGNOSIS — C50911 Malignant neoplasm of unspecified site of right female breast: Secondary | ICD-10-CM | POA: Diagnosis not present

## 2023-05-13 DIAGNOSIS — I480 Paroxysmal atrial fibrillation: Secondary | ICD-10-CM | POA: Diagnosis not present

## 2023-05-13 DIAGNOSIS — I214 Non-ST elevation (NSTEMI) myocardial infarction: Secondary | ICD-10-CM | POA: Diagnosis not present

## 2023-05-13 DIAGNOSIS — N186 End stage renal disease: Secondary | ICD-10-CM | POA: Diagnosis not present

## 2023-05-13 DIAGNOSIS — E785 Hyperlipidemia, unspecified: Secondary | ICD-10-CM | POA: Diagnosis not present

## 2023-05-13 DIAGNOSIS — J9601 Acute respiratory failure with hypoxia: Secondary | ICD-10-CM | POA: Diagnosis not present

## 2023-05-13 DIAGNOSIS — Z853 Personal history of malignant neoplasm of breast: Secondary | ICD-10-CM | POA: Diagnosis not present

## 2023-05-13 DIAGNOSIS — D500 Iron deficiency anemia secondary to blood loss (chronic): Secondary | ICD-10-CM | POA: Diagnosis not present

## 2023-05-14 DIAGNOSIS — C50911 Malignant neoplasm of unspecified site of right female breast: Secondary | ICD-10-CM | POA: Diagnosis not present

## 2023-05-14 DIAGNOSIS — Z992 Dependence on renal dialysis: Secondary | ICD-10-CM | POA: Diagnosis not present

## 2023-05-14 DIAGNOSIS — I214 Non-ST elevation (NSTEMI) myocardial infarction: Secondary | ICD-10-CM | POA: Diagnosis not present

## 2023-05-14 DIAGNOSIS — I1311 Hypertensive heart and chronic kidney disease without heart failure, with stage 5 chronic kidney disease, or end stage renal disease: Secondary | ICD-10-CM | POA: Diagnosis not present

## 2023-05-14 DIAGNOSIS — N186 End stage renal disease: Secondary | ICD-10-CM | POA: Diagnosis not present

## 2023-05-14 DIAGNOSIS — E785 Hyperlipidemia, unspecified: Secondary | ICD-10-CM | POA: Diagnosis not present

## 2023-05-14 DIAGNOSIS — Z7901 Long term (current) use of anticoagulants: Secondary | ICD-10-CM | POA: Diagnosis not present

## 2023-05-14 DIAGNOSIS — I480 Paroxysmal atrial fibrillation: Secondary | ICD-10-CM | POA: Diagnosis not present

## 2023-05-14 DIAGNOSIS — Z7982 Long term (current) use of aspirin: Secondary | ICD-10-CM | POA: Diagnosis not present

## 2023-05-14 DIAGNOSIS — Z853 Personal history of malignant neoplasm of breast: Secondary | ICD-10-CM | POA: Diagnosis not present

## 2023-05-14 DIAGNOSIS — D500 Iron deficiency anemia secondary to blood loss (chronic): Secondary | ICD-10-CM | POA: Diagnosis not present

## 2023-05-14 DIAGNOSIS — J9601 Acute respiratory failure with hypoxia: Secondary | ICD-10-CM | POA: Diagnosis not present

## 2023-05-15 DIAGNOSIS — D509 Iron deficiency anemia, unspecified: Secondary | ICD-10-CM | POA: Diagnosis not present

## 2023-05-15 DIAGNOSIS — R52 Pain, unspecified: Secondary | ICD-10-CM | POA: Diagnosis not present

## 2023-05-15 DIAGNOSIS — Z4901 Encounter for fitting and adjustment of extracorporeal dialysis catheter: Secondary | ICD-10-CM | POA: Diagnosis not present

## 2023-05-15 DIAGNOSIS — Z992 Dependence on renal dialysis: Secondary | ICD-10-CM | POA: Diagnosis not present

## 2023-05-15 DIAGNOSIS — D689 Coagulation defect, unspecified: Secondary | ICD-10-CM | POA: Diagnosis not present

## 2023-05-15 DIAGNOSIS — N186 End stage renal disease: Secondary | ICD-10-CM | POA: Diagnosis not present

## 2023-05-15 DIAGNOSIS — N2581 Secondary hyperparathyroidism of renal origin: Secondary | ICD-10-CM | POA: Diagnosis not present

## 2023-05-15 DIAGNOSIS — Z23 Encounter for immunization: Secondary | ICD-10-CM | POA: Diagnosis not present

## 2023-05-16 ENCOUNTER — Ambulatory Visit (HOSPITAL_COMMUNITY)
Admission: RE | Admit: 2023-05-16 | Discharge: 2023-05-16 | Disposition: A | Discharge status: Home / Self Care | Payer: Medicare Other | Source: Ambulatory Visit | Attending: Hematology and Oncology | Admitting: Hematology and Oncology

## 2023-05-16 DIAGNOSIS — Z170 Estrogen receptor positive status [ER+]: Secondary | ICD-10-CM | POA: Insufficient documentation

## 2023-05-16 DIAGNOSIS — E785 Hyperlipidemia, unspecified: Secondary | ICD-10-CM | POA: Diagnosis not present

## 2023-05-16 DIAGNOSIS — K8020 Calculus of gallbladder without cholecystitis without obstruction: Secondary | ICD-10-CM | POA: Diagnosis not present

## 2023-05-16 DIAGNOSIS — D500 Iron deficiency anemia secondary to blood loss (chronic): Secondary | ICD-10-CM | POA: Diagnosis not present

## 2023-05-16 DIAGNOSIS — R599 Enlarged lymph nodes, unspecified: Secondary | ICD-10-CM | POA: Diagnosis not present

## 2023-05-16 DIAGNOSIS — I214 Non-ST elevation (NSTEMI) myocardial infarction: Secondary | ICD-10-CM | POA: Diagnosis not present

## 2023-05-16 DIAGNOSIS — I480 Paroxysmal atrial fibrillation: Secondary | ICD-10-CM | POA: Diagnosis not present

## 2023-05-16 DIAGNOSIS — Z992 Dependence on renal dialysis: Secondary | ICD-10-CM | POA: Diagnosis not present

## 2023-05-16 DIAGNOSIS — J8410 Pulmonary fibrosis, unspecified: Secondary | ICD-10-CM | POA: Diagnosis not present

## 2023-05-16 DIAGNOSIS — C50412 Malignant neoplasm of upper-outer quadrant of left female breast: Secondary | ICD-10-CM | POA: Insufficient documentation

## 2023-05-16 DIAGNOSIS — Z853 Personal history of malignant neoplasm of breast: Secondary | ICD-10-CM | POA: Diagnosis not present

## 2023-05-16 DIAGNOSIS — N186 End stage renal disease: Secondary | ICD-10-CM | POA: Diagnosis not present

## 2023-05-16 DIAGNOSIS — C50911 Malignant neoplasm of unspecified site of right female breast: Secondary | ICD-10-CM | POA: Diagnosis not present

## 2023-05-16 DIAGNOSIS — Z7901 Long term (current) use of anticoagulants: Secondary | ICD-10-CM | POA: Diagnosis not present

## 2023-05-16 DIAGNOSIS — Z7982 Long term (current) use of aspirin: Secondary | ICD-10-CM | POA: Diagnosis not present

## 2023-05-16 DIAGNOSIS — J9601 Acute respiratory failure with hypoxia: Secondary | ICD-10-CM | POA: Diagnosis not present

## 2023-05-16 DIAGNOSIS — I1311 Hypertensive heart and chronic kidney disease without heart failure, with stage 5 chronic kidney disease, or end stage renal disease: Secondary | ICD-10-CM | POA: Diagnosis not present

## 2023-05-17 DIAGNOSIS — Z992 Dependence on renal dialysis: Secondary | ICD-10-CM | POA: Diagnosis not present

## 2023-05-17 DIAGNOSIS — N2581 Secondary hyperparathyroidism of renal origin: Secondary | ICD-10-CM | POA: Diagnosis not present

## 2023-05-17 DIAGNOSIS — N186 End stage renal disease: Secondary | ICD-10-CM | POA: Diagnosis not present

## 2023-05-17 DIAGNOSIS — D509 Iron deficiency anemia, unspecified: Secondary | ICD-10-CM | POA: Diagnosis not present

## 2023-05-17 DIAGNOSIS — R52 Pain, unspecified: Secondary | ICD-10-CM | POA: Diagnosis not present

## 2023-05-17 DIAGNOSIS — Z23 Encounter for immunization: Secondary | ICD-10-CM | POA: Diagnosis not present

## 2023-05-17 DIAGNOSIS — D689 Coagulation defect, unspecified: Secondary | ICD-10-CM | POA: Diagnosis not present

## 2023-05-17 DIAGNOSIS — Z4901 Encounter for fitting and adjustment of extracorporeal dialysis catheter: Secondary | ICD-10-CM | POA: Diagnosis not present

## 2023-05-18 ENCOUNTER — Ambulatory Visit: Payer: Medicare Other | Admitting: Hematology and Oncology

## 2023-05-18 DIAGNOSIS — I480 Paroxysmal atrial fibrillation: Secondary | ICD-10-CM | POA: Diagnosis not present

## 2023-05-18 DIAGNOSIS — J9601 Acute respiratory failure with hypoxia: Secondary | ICD-10-CM | POA: Diagnosis not present

## 2023-05-18 DIAGNOSIS — D500 Iron deficiency anemia secondary to blood loss (chronic): Secondary | ICD-10-CM | POA: Diagnosis not present

## 2023-05-18 DIAGNOSIS — E785 Hyperlipidemia, unspecified: Secondary | ICD-10-CM | POA: Diagnosis not present

## 2023-05-18 DIAGNOSIS — N186 End stage renal disease: Secondary | ICD-10-CM | POA: Diagnosis not present

## 2023-05-18 DIAGNOSIS — Z7982 Long term (current) use of aspirin: Secondary | ICD-10-CM | POA: Diagnosis not present

## 2023-05-18 DIAGNOSIS — Z992 Dependence on renal dialysis: Secondary | ICD-10-CM | POA: Diagnosis not present

## 2023-05-18 DIAGNOSIS — I1311 Hypertensive heart and chronic kidney disease without heart failure, with stage 5 chronic kidney disease, or end stage renal disease: Secondary | ICD-10-CM | POA: Diagnosis not present

## 2023-05-18 DIAGNOSIS — Z853 Personal history of malignant neoplasm of breast: Secondary | ICD-10-CM | POA: Diagnosis not present

## 2023-05-18 DIAGNOSIS — C50911 Malignant neoplasm of unspecified site of right female breast: Secondary | ICD-10-CM | POA: Diagnosis not present

## 2023-05-18 DIAGNOSIS — Z7901 Long term (current) use of anticoagulants: Secondary | ICD-10-CM | POA: Diagnosis not present

## 2023-05-18 DIAGNOSIS — I214 Non-ST elevation (NSTEMI) myocardial infarction: Secondary | ICD-10-CM | POA: Diagnosis not present

## 2023-05-19 DIAGNOSIS — Z4901 Encounter for fitting and adjustment of extracorporeal dialysis catheter: Secondary | ICD-10-CM | POA: Diagnosis not present

## 2023-05-19 DIAGNOSIS — Z23 Encounter for immunization: Secondary | ICD-10-CM | POA: Diagnosis not present

## 2023-05-19 DIAGNOSIS — N2581 Secondary hyperparathyroidism of renal origin: Secondary | ICD-10-CM | POA: Diagnosis not present

## 2023-05-19 DIAGNOSIS — Z992 Dependence on renal dialysis: Secondary | ICD-10-CM | POA: Diagnosis not present

## 2023-05-19 DIAGNOSIS — N186 End stage renal disease: Secondary | ICD-10-CM | POA: Diagnosis not present

## 2023-05-19 DIAGNOSIS — D509 Iron deficiency anemia, unspecified: Secondary | ICD-10-CM | POA: Diagnosis not present

## 2023-05-19 DIAGNOSIS — R52 Pain, unspecified: Secondary | ICD-10-CM | POA: Diagnosis not present

## 2023-05-19 DIAGNOSIS — D689 Coagulation defect, unspecified: Secondary | ICD-10-CM | POA: Diagnosis not present

## 2023-05-21 ENCOUNTER — Inpatient Hospital Stay: Payer: Medicare Other | Attending: Hematology and Oncology | Admitting: Hematology and Oncology

## 2023-05-21 VITALS — BP 167/56 | HR 64 | Temp 98.2°F | Resp 17 | Ht 62.0 in | Wt 142.1 lb

## 2023-05-21 DIAGNOSIS — Z1721 Progesterone receptor positive status: Secondary | ICD-10-CM | POA: Insufficient documentation

## 2023-05-21 DIAGNOSIS — I214 Non-ST elevation (NSTEMI) myocardial infarction: Secondary | ICD-10-CM | POA: Diagnosis not present

## 2023-05-21 DIAGNOSIS — Z170 Estrogen receptor positive status [ER+]: Secondary | ICD-10-CM | POA: Insufficient documentation

## 2023-05-21 DIAGNOSIS — N186 End stage renal disease: Secondary | ICD-10-CM | POA: Diagnosis not present

## 2023-05-21 DIAGNOSIS — Z7982 Long term (current) use of aspirin: Secondary | ICD-10-CM | POA: Diagnosis not present

## 2023-05-21 DIAGNOSIS — J9601 Acute respiratory failure with hypoxia: Secondary | ICD-10-CM | POA: Diagnosis not present

## 2023-05-21 DIAGNOSIS — E785 Hyperlipidemia, unspecified: Secondary | ICD-10-CM | POA: Diagnosis not present

## 2023-05-21 DIAGNOSIS — C50412 Malignant neoplasm of upper-outer quadrant of left female breast: Secondary | ICD-10-CM | POA: Diagnosis not present

## 2023-05-21 DIAGNOSIS — I480 Paroxysmal atrial fibrillation: Secondary | ICD-10-CM | POA: Diagnosis not present

## 2023-05-21 DIAGNOSIS — Z79899 Other long term (current) drug therapy: Secondary | ICD-10-CM | POA: Insufficient documentation

## 2023-05-21 DIAGNOSIS — Z992 Dependence on renal dialysis: Secondary | ICD-10-CM | POA: Diagnosis not present

## 2023-05-21 DIAGNOSIS — Z853 Personal history of malignant neoplasm of breast: Secondary | ICD-10-CM | POA: Diagnosis not present

## 2023-05-21 DIAGNOSIS — D500 Iron deficiency anemia secondary to blood loss (chronic): Secondary | ICD-10-CM | POA: Diagnosis not present

## 2023-05-21 DIAGNOSIS — Z1732 Human epidermal growth factor receptor 2 negative status: Secondary | ICD-10-CM | POA: Diagnosis not present

## 2023-05-21 DIAGNOSIS — Z7901 Long term (current) use of anticoagulants: Secondary | ICD-10-CM | POA: Diagnosis not present

## 2023-05-21 DIAGNOSIS — I1311 Hypertensive heart and chronic kidney disease without heart failure, with stage 5 chronic kidney disease, or end stage renal disease: Secondary | ICD-10-CM | POA: Diagnosis not present

## 2023-05-21 DIAGNOSIS — C50911 Malignant neoplasm of unspecified site of right female breast: Secondary | ICD-10-CM | POA: Diagnosis not present

## 2023-05-21 NOTE — Progress Notes (Signed)
 Patient Care Team: Jearldine Mina, MD as PCP - General (Internal Medicine) Jann Melody, MD as PCP - Cardiology (Cardiology) Lillian Rein, MD as Consulting Physician (Gynecology) Auther Bo, RN as Oncology Nurse Navigator Alane Hsu, RN as Oncology Nurse Navigator Cameron Cea, MD as Consulting Physician (Hematology and Oncology) Baron Border, MD as Attending Physician (Nephrology) Center, [facility]  DIAGNOSIS:  Encounter Diagnosis  Name Primary?   Malignant neoplasm of upper-outer quadrant of left breast in female, estrogen receptor positive (HCC) Yes    SUMMARY OF ONCOLOGIC HISTORY: Oncology History  Malignant neoplasm of upper-outer quadrant of left breast in female, estrogen receptor positive (HCC)  08/02/2021 Initial Diagnosis   Screening mammogram detected left breast distortion measuring 1.1 cm by ultrasound, negative axillary lymph nodes.  Biopsy revealed grade 2 ILC ER 100%, PR 95%, Ki-67 5%, HER2 IHC 2+, negative ratio 1.29   08/29/2021 Cancer Staging   Staging form: Breast, AJCC 8th Edition - Clinical stage from 08/29/2021: Stage IA (cT1c, cN0, cM0, G2, ER+, PR+, HER2-) - Signed by Cameron Cea, MD on 08/29/2021 Stage prefix: Initial diagnosis Histologic grading system: 3 grade system     CHIEF COMPLIANT: Follow-up of metastatic breast cancer of recent scans  HISTORY OF PRESENT ILLNESS:  History of Present Illness   Rachel Villarreal "Marily Shows" is a 78 year old female with metastatic breast cancer who presents for follow-up after recent hospitalization for pneumonia and cardiac issues.  She was hospitalized for ten days followed by ten days in rehabilitation due to influenza A, which led to pneumonia. During her hospitalization, she experienced elevated troponin levels and was diagnosed with a myocardial infarction, although she did not have any symptoms. She was also noted to have atrial flutter, initially thought to be  atrial fibrillation, which was attributed to the pneumonia.  A recent scan showed stable findings with no new areas of concern. The lymph node in the middle of her chest remains unchanged in size, and there are no new findings in her abdomen.  She is currently on two antiplatelet medications and plans to discontinue aspirin  after finishing the current bottle. She mentions the possibility of discontinuing Eliquis  in the future and wearing a Holter monitor for further evaluation.  She initially went to the ER thinking she was having an asthma attack due to breathlessness, but it was related to her pneumonia. She is using her inhaler to prevent coughing spells. Her energy levels have returned, and she feels good overall.  She is taking anastrozole  and experiences hot flashes, which are not long-lasting. She received a small electric fan during her hospital stay, which she finds helpful.  Her anemia worsened during the hospital stay but improved by discharge, with hemoglobin levels rising from 10.2 to 11.1. She received an EPO injection after leaving the hospital.  No abdominal pain.         ALLERGIES:  is allergic to ace inhibitors, lactose intolerance (gi), letrozole , lisinopril, mercury, and tape.  MEDICATIONS:  Current Outpatient Medications  Medication Sig Dispense Refill   acetaminophen  (TYLENOL ) 500 MG tablet Take 500 mg by mouth every 6 (six) hours as needed for mild pain (pain score 1-3).     alum & mag hydroxide-simeth (MAALOX/MYLANTA) 200-200-20 MG/5ML suspension Take 30 mLs by mouth every 6 (six) hours as needed for indigestion or heartburn.     amiodarone  (PACERONE ) 200 MG tablet Take 1 tablet (200 mg total) by mouth daily.     anastrozole  (ARIMIDEX ) 1  MG tablet Take 1 tablet (1 mg total) by mouth daily. 90 tablet 3   apixaban  (ELIQUIS ) 5 MG TABS tablet Take 1 tablet (5 mg total) by mouth 2 (two) times daily.     aspirin  EC 81 MG tablet Take 1 tablet (81 mg total) by mouth daily.  Swallow whole.     atorvastatin  (LIPITOR ) 80 MG tablet Take 1 tablet (80 mg total) by mouth in the morning.     budesonide  (RHINOCORT  AQUA) 32 MCG/ACT nasal spray Place 2 sprays into both nostrils in the morning.     Cholecalciferol 50 MCG (2000 UT) CAPS Take 2,000 Units by mouth in the morning.     diclofenac  Sodium (VOLTAREN ) 1 % GEL Apply 1 Application topically 4 (four) times daily as needed (pain.).     eszopiclone (LUNESTA) 2 MG TABS tablet Take 2 mg by mouth at bedtime.     febuxostat  (ULORIC ) 40 MG tablet Take 40 mg by mouth in the morning.     loratadine  (CLARITIN ) 10 MG tablet Take 10 mg by mouth 2 (two) times daily.     metoprolol  succinate (TOPROL -XL) 100 MG 24 hr tablet TAKE 1 TABLET BY MOUTH DAILY, YOU MAY TAKE 1/2 TABLET DAILY ONLY AS NEEDED FOR ELEVATED HEART RATE. Take with or immediately following a meal.     Multiple Vitamins-Minerals (PRESERVISION AREDS 2+MULTI VIT PO) Take 1 capsule by mouth 2 (two) times daily.     pantoprazole  (PROTONIX ) 40 MG tablet Take 40 mg by mouth daily.     polyethylene glycol (MIRALAX  / GLYCOLAX ) 17 g packet Take 17 g by mouth daily.     PROAIR  HFA 108 (90 BASE) MCG/ACT inhaler Inhale 1-2 puffs into the lungs every 6 (six) hours as needed for shortness of breath or wheezing (for respiratory issues.).     rOPINIRole  (REQUIP ) 0.5 MG tablet Take 1 tablet (0.5 mg total) by mouth every 12 (twelve) hours as needed (Restless legs).     senna-docusate (SENOKOT-S) 8.6-50 MG tablet Take 2 tablets by mouth 2 (two) times daily.     sevelamer  carbonate (RENVELA ) 800 MG tablet Take 800 mg by mouth 3 (three) times daily with meals.     traZODone  (DESYREL ) 50 MG tablet Take 0.5 tablets (25 mg total) by mouth at bedtime.     No current facility-administered medications for this visit.    PHYSICAL EXAMINATION: ECOG PERFORMANCE STATUS: 1 - Symptomatic but completely ambulatory  Vitals:   05/21/23 1110  BP: (!) 167/56  Pulse: 64  Resp: 17  Temp: 98.2 F (36.8  C)  SpO2: 96%   Filed Weights   05/21/23 1110  Weight: 142 lb 1.6 oz (64.5 kg)    Physical Exam          (exam performed in the presence of a chaperone)  LABORATORY DATA:  I have reviewed the data as listed    Latest Ref Rng & Units 05/10/2023    1:14 PM 04/24/2023    2:16 AM 04/23/2023    1:00 PM  CMP  Glucose 70 - 99 mg/dL 84  409    BUN 8 - 27 mg/dL 40  76    Creatinine 8.11 - 1.00 mg/dL 9.14  7.82    Sodium 956 - 144 mmol/L 141  139  134   Potassium 3.5 - 5.2 mmol/L 3.8  3.9  3.7   Chloride 96 - 106 mmol/L 97  102    CO2 20 - 29 mmol/L 24  23  Calcium  8.7 - 10.3 mg/dL 9.1  9.7    Total Protein 6.0 - 8.5 g/dL 5.5     Total Bilirubin 0.0 - 1.2 mg/dL 0.4     Alkaline Phos 44 - 121 IU/L 137     AST 0 - 40 IU/L 14     ALT 0 - 32 IU/L 10       Lab Results  Component Value Date   WBC 14.1 (H) 04/25/2023   HGB 11.1 (L) 04/25/2023   HCT 32.5 (L) 04/25/2023   MCV 92.9 04/25/2023   PLT 282 04/25/2023   NEUTROABS 6.1 04/16/2023    ASSESSMENT & PLAN:  Malignant neoplasm of upper-outer quadrant of left breast in female, estrogen receptor positive (HCC) Left Lumpectomy: 1.1 cm Grade 1 ILC , Positive Posterior margin, ER 100%, PR 95%, Ki-67 5%, HER2 IHC 2+, negative ratio 1.29 Margin excision 09/29/2021: Benign  Left lower quadrant abdominal pain: CT abdomen 02/07/2022: Right lower quadrant mesenteric mass, 5 mm right lung base nodule   PET CT scan 02/10/2022: 2.7 cm mesenteric mass is hypermetabolic consistent with neoplastic process.  Multiple hypermetabolic bone lesions involving the spine most notable at L4, focus of hypermetabolism or vaginal cuff, right atrial appendage -------------------------------------------------------------------- Treatment plan:  Verzinio (started 04/11/2022 stopped May 2024) to letrozole    07/10/2022: CT CAP: Interval decrease in the size of the soft tissue nodule in the right side of mid pelvic mesentery 2 cm (previously 2.7 cm), skin thickening  left chest, patchy groundglass parenchymal opacities in the lungs right-sided renal atrophy   Recommendation: Stopped Verzinio 08/23/2022 Switch letrozole  to anastrozole  (because of the maculopapular rash)   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  New bilateral small pleural effusions, 1.4 cm wedge-shaped opacity right lower lobe needs follow-up.   CT CAP 11/23/2022: No new or progressive metastatic disease.  Wedge-shaped opacity significantly decreased.  Stable 1.8 cm mesenteric lymph node. CT CAP 05/18/2023: Slightly increased left pleural effusion.  Unchanged nonspecific right lung nodule, unchanged mesenteric nodule 1.7 cm and, unchanged precaval lymph node 1.6 cm, unchanged left adrenal benign nodule     Breast Cancer Stable on Anastrozole  with manageable hot flashes and no rash. Recent scans showed no new or worsening findings. -Continue Anastrozole .  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 Hospitalization: January 2025: Influenza pneumonia, diagnosed with atrial fibrillation/atrial flutter  Follow-up in 6 months after scans  Orders Placed This Encounter  Procedures   CT CHEST ABDOMEN PELVIS WO CONTRAST    Standing Status:   Future    Expected Date:   11/18/2023    Expiration Date:   05/20/2024    Preferred imaging location?:   [facility]    If indicated for the ordered procedure, I authorize the administration of oral contrast media per Radiology protocol:   Yes    Does the patient have a contrast media/X-ray dye allergy?:   Yes    Release to patient:   Immediate   The patient has a good understanding of the overall plan. she agrees with it. she will call with any problems that may develop before the next visit here. Total time spent: 30 mins including face to face time and time spent for planning, charting and co-ordination of care   Viinay K Nyx Keady, MD 05/21/23

## 2023-05-21 NOTE — Assessment & Plan Note (Signed)
 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  Multiple hypermetabolic bone lesions involving the spine most notable at L4, focus of hypermetabolism or vaginal cuff, right atrial appendage -------------------------------------------------------------------- Treatment plan:  Verzinio (started 04/11/2022 stopped May 2024) to letrozole    07/10/2022: CT CAP: Interval decrease in the size of the soft tissue nodule in the right side of mid pelvic mesentery 2 cm (previously 2.7 cm), skin thickening left chest, patchy groundglass parenchymal opacities in the lungs right-sided renal atrophy   Recommendation: Stopped Verzinio 08/23/2022 Switch letrozole  to anastrozole  (because of the maculopapular rash)   Anemia: Secondary to Verzinio.  Improving.     CT CAP 10/11/2022: No new metastatic breast cancer identified.  New bilateral small pleural effusions, 1.4 cm wedge-shaped opacity right lower lobe needs follow-up.   CT CAP 11/23/2022: No new or progressive metastatic disease.  Wedge-shaped opacity significantly decreased.  Stable 1.8 cm mesenteric lymph node. CT CAP 05/18/2023: Slightly increased left pleural effusion.  Unchanged nonspecific right lung nodule, unchanged mesenteric nodule 1.7 cm and, unchanged precaval lymph node 1.6 cm, unchanged left adrenal benign nodule     Breast Cancer Stable on Anastrozole  with manageable hot flashes and no rash. Recent scans showed no new or worsening findings. -Continue Anastrozole . Scans will be done every 6 months.  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

## 2023-05-22 DIAGNOSIS — Z4901 Encounter for fitting and adjustment of extracorporeal dialysis catheter: Secondary | ICD-10-CM | POA: Diagnosis not present

## 2023-05-22 DIAGNOSIS — D689 Coagulation defect, unspecified: Secondary | ICD-10-CM | POA: Diagnosis not present

## 2023-05-22 DIAGNOSIS — N186 End stage renal disease: Secondary | ICD-10-CM | POA: Diagnosis not present

## 2023-05-22 DIAGNOSIS — R52 Pain, unspecified: Secondary | ICD-10-CM | POA: Diagnosis not present

## 2023-05-22 DIAGNOSIS — Z23 Encounter for immunization: Secondary | ICD-10-CM | POA: Diagnosis not present

## 2023-05-22 DIAGNOSIS — D509 Iron deficiency anemia, unspecified: Secondary | ICD-10-CM | POA: Diagnosis not present

## 2023-05-22 DIAGNOSIS — Z992 Dependence on renal dialysis: Secondary | ICD-10-CM | POA: Diagnosis not present

## 2023-05-22 DIAGNOSIS — N2581 Secondary hyperparathyroidism of renal origin: Secondary | ICD-10-CM | POA: Diagnosis not present

## 2023-05-23 DIAGNOSIS — Z7982 Long term (current) use of aspirin: Secondary | ICD-10-CM | POA: Diagnosis not present

## 2023-05-23 DIAGNOSIS — Z7901 Long term (current) use of anticoagulants: Secondary | ICD-10-CM | POA: Diagnosis not present

## 2023-05-23 DIAGNOSIS — Z853 Personal history of malignant neoplasm of breast: Secondary | ICD-10-CM | POA: Diagnosis not present

## 2023-05-23 DIAGNOSIS — N186 End stage renal disease: Secondary | ICD-10-CM | POA: Diagnosis not present

## 2023-05-23 DIAGNOSIS — I480 Paroxysmal atrial fibrillation: Secondary | ICD-10-CM | POA: Diagnosis not present

## 2023-05-23 DIAGNOSIS — I1311 Hypertensive heart and chronic kidney disease without heart failure, with stage 5 chronic kidney disease, or end stage renal disease: Secondary | ICD-10-CM | POA: Diagnosis not present

## 2023-05-23 DIAGNOSIS — I214 Non-ST elevation (NSTEMI) myocardial infarction: Secondary | ICD-10-CM | POA: Diagnosis not present

## 2023-05-23 DIAGNOSIS — Z992 Dependence on renal dialysis: Secondary | ICD-10-CM | POA: Diagnosis not present

## 2023-05-23 DIAGNOSIS — E785 Hyperlipidemia, unspecified: Secondary | ICD-10-CM | POA: Diagnosis not present

## 2023-05-23 DIAGNOSIS — D500 Iron deficiency anemia secondary to blood loss (chronic): Secondary | ICD-10-CM | POA: Diagnosis not present

## 2023-05-23 DIAGNOSIS — J9601 Acute respiratory failure with hypoxia: Secondary | ICD-10-CM | POA: Diagnosis not present

## 2023-05-23 DIAGNOSIS — C50911 Malignant neoplasm of unspecified site of right female breast: Secondary | ICD-10-CM | POA: Diagnosis not present

## 2023-05-24 DIAGNOSIS — N2581 Secondary hyperparathyroidism of renal origin: Secondary | ICD-10-CM | POA: Diagnosis not present

## 2023-05-24 DIAGNOSIS — Z992 Dependence on renal dialysis: Secondary | ICD-10-CM | POA: Diagnosis not present

## 2023-05-24 DIAGNOSIS — R52 Pain, unspecified: Secondary | ICD-10-CM | POA: Diagnosis not present

## 2023-05-24 DIAGNOSIS — D509 Iron deficiency anemia, unspecified: Secondary | ICD-10-CM | POA: Diagnosis not present

## 2023-05-24 DIAGNOSIS — Z23 Encounter for immunization: Secondary | ICD-10-CM | POA: Diagnosis not present

## 2023-05-24 DIAGNOSIS — Z4901 Encounter for fitting and adjustment of extracorporeal dialysis catheter: Secondary | ICD-10-CM | POA: Diagnosis not present

## 2023-05-24 DIAGNOSIS — N186 End stage renal disease: Secondary | ICD-10-CM | POA: Diagnosis not present

## 2023-05-24 DIAGNOSIS — D689 Coagulation defect, unspecified: Secondary | ICD-10-CM | POA: Diagnosis not present

## 2023-05-25 DIAGNOSIS — E785 Hyperlipidemia, unspecified: Secondary | ICD-10-CM | POA: Diagnosis not present

## 2023-05-25 DIAGNOSIS — C50911 Malignant neoplasm of unspecified site of right female breast: Secondary | ICD-10-CM | POA: Diagnosis not present

## 2023-05-25 DIAGNOSIS — D500 Iron deficiency anemia secondary to blood loss (chronic): Secondary | ICD-10-CM | POA: Diagnosis not present

## 2023-05-25 DIAGNOSIS — J9601 Acute respiratory failure with hypoxia: Secondary | ICD-10-CM | POA: Diagnosis not present

## 2023-05-25 DIAGNOSIS — I214 Non-ST elevation (NSTEMI) myocardial infarction: Secondary | ICD-10-CM | POA: Diagnosis not present

## 2023-05-25 DIAGNOSIS — Z853 Personal history of malignant neoplasm of breast: Secondary | ICD-10-CM | POA: Diagnosis not present

## 2023-05-25 DIAGNOSIS — I480 Paroxysmal atrial fibrillation: Secondary | ICD-10-CM | POA: Diagnosis not present

## 2023-05-25 DIAGNOSIS — I1311 Hypertensive heart and chronic kidney disease without heart failure, with stage 5 chronic kidney disease, or end stage renal disease: Secondary | ICD-10-CM | POA: Diagnosis not present

## 2023-05-25 DIAGNOSIS — Z992 Dependence on renal dialysis: Secondary | ICD-10-CM | POA: Diagnosis not present

## 2023-05-25 DIAGNOSIS — N186 End stage renal disease: Secondary | ICD-10-CM | POA: Diagnosis not present

## 2023-05-25 DIAGNOSIS — Z7982 Long term (current) use of aspirin: Secondary | ICD-10-CM | POA: Diagnosis not present

## 2023-05-25 DIAGNOSIS — Z7901 Long term (current) use of anticoagulants: Secondary | ICD-10-CM | POA: Diagnosis not present

## 2023-05-26 DIAGNOSIS — Z23 Encounter for immunization: Secondary | ICD-10-CM | POA: Diagnosis not present

## 2023-05-26 DIAGNOSIS — R52 Pain, unspecified: Secondary | ICD-10-CM | POA: Diagnosis not present

## 2023-05-26 DIAGNOSIS — D509 Iron deficiency anemia, unspecified: Secondary | ICD-10-CM | POA: Diagnosis not present

## 2023-05-26 DIAGNOSIS — N186 End stage renal disease: Secondary | ICD-10-CM | POA: Diagnosis not present

## 2023-05-26 DIAGNOSIS — Z992 Dependence on renal dialysis: Secondary | ICD-10-CM | POA: Diagnosis not present

## 2023-05-26 DIAGNOSIS — Z4901 Encounter for fitting and adjustment of extracorporeal dialysis catheter: Secondary | ICD-10-CM | POA: Diagnosis not present

## 2023-05-26 DIAGNOSIS — D689 Coagulation defect, unspecified: Secondary | ICD-10-CM | POA: Diagnosis not present

## 2023-05-26 DIAGNOSIS — N2581 Secondary hyperparathyroidism of renal origin: Secondary | ICD-10-CM | POA: Diagnosis not present

## 2023-05-28 DIAGNOSIS — Z853 Personal history of malignant neoplasm of breast: Secondary | ICD-10-CM | POA: Diagnosis not present

## 2023-05-28 DIAGNOSIS — I480 Paroxysmal atrial fibrillation: Secondary | ICD-10-CM | POA: Diagnosis not present

## 2023-05-28 DIAGNOSIS — I214 Non-ST elevation (NSTEMI) myocardial infarction: Secondary | ICD-10-CM | POA: Diagnosis not present

## 2023-05-28 DIAGNOSIS — Z992 Dependence on renal dialysis: Secondary | ICD-10-CM | POA: Diagnosis not present

## 2023-05-28 DIAGNOSIS — N186 End stage renal disease: Secondary | ICD-10-CM | POA: Diagnosis not present

## 2023-05-28 DIAGNOSIS — D500 Iron deficiency anemia secondary to blood loss (chronic): Secondary | ICD-10-CM | POA: Diagnosis not present

## 2023-05-28 DIAGNOSIS — E785 Hyperlipidemia, unspecified: Secondary | ICD-10-CM | POA: Diagnosis not present

## 2023-05-28 DIAGNOSIS — C50911 Malignant neoplasm of unspecified site of right female breast: Secondary | ICD-10-CM | POA: Diagnosis not present

## 2023-05-28 DIAGNOSIS — Z7901 Long term (current) use of anticoagulants: Secondary | ICD-10-CM | POA: Diagnosis not present

## 2023-05-28 DIAGNOSIS — J9601 Acute respiratory failure with hypoxia: Secondary | ICD-10-CM | POA: Diagnosis not present

## 2023-05-28 DIAGNOSIS — I1311 Hypertensive heart and chronic kidney disease without heart failure, with stage 5 chronic kidney disease, or end stage renal disease: Secondary | ICD-10-CM | POA: Diagnosis not present

## 2023-05-28 DIAGNOSIS — Z7982 Long term (current) use of aspirin: Secondary | ICD-10-CM | POA: Diagnosis not present

## 2023-05-29 DIAGNOSIS — R52 Pain, unspecified: Secondary | ICD-10-CM | POA: Diagnosis not present

## 2023-05-29 DIAGNOSIS — Z23 Encounter for immunization: Secondary | ICD-10-CM | POA: Diagnosis not present

## 2023-05-29 DIAGNOSIS — Z992 Dependence on renal dialysis: Secondary | ICD-10-CM | POA: Diagnosis not present

## 2023-05-29 DIAGNOSIS — Z4901 Encounter for fitting and adjustment of extracorporeal dialysis catheter: Secondary | ICD-10-CM | POA: Diagnosis not present

## 2023-05-29 DIAGNOSIS — D689 Coagulation defect, unspecified: Secondary | ICD-10-CM | POA: Diagnosis not present

## 2023-05-29 DIAGNOSIS — D509 Iron deficiency anemia, unspecified: Secondary | ICD-10-CM | POA: Diagnosis not present

## 2023-05-29 DIAGNOSIS — N2581 Secondary hyperparathyroidism of renal origin: Secondary | ICD-10-CM | POA: Diagnosis not present

## 2023-05-29 DIAGNOSIS — N186 End stage renal disease: Secondary | ICD-10-CM | POA: Diagnosis not present

## 2023-06-01 DIAGNOSIS — Z23 Encounter for immunization: Secondary | ICD-10-CM | POA: Diagnosis not present

## 2023-06-01 DIAGNOSIS — Z4901 Encounter for fitting and adjustment of extracorporeal dialysis catheter: Secondary | ICD-10-CM | POA: Diagnosis not present

## 2023-06-01 DIAGNOSIS — Z992 Dependence on renal dialysis: Secondary | ICD-10-CM | POA: Diagnosis not present

## 2023-06-01 DIAGNOSIS — R52 Pain, unspecified: Secondary | ICD-10-CM | POA: Diagnosis not present

## 2023-06-01 DIAGNOSIS — N2581 Secondary hyperparathyroidism of renal origin: Secondary | ICD-10-CM | POA: Diagnosis not present

## 2023-06-01 DIAGNOSIS — D689 Coagulation defect, unspecified: Secondary | ICD-10-CM | POA: Diagnosis not present

## 2023-06-01 DIAGNOSIS — D509 Iron deficiency anemia, unspecified: Secondary | ICD-10-CM | POA: Diagnosis not present

## 2023-06-01 DIAGNOSIS — N186 End stage renal disease: Secondary | ICD-10-CM | POA: Diagnosis not present

## 2023-06-02 DIAGNOSIS — Z992 Dependence on renal dialysis: Secondary | ICD-10-CM | POA: Diagnosis not present

## 2023-06-02 DIAGNOSIS — N186 End stage renal disease: Secondary | ICD-10-CM | POA: Diagnosis not present

## 2023-06-02 DIAGNOSIS — D509 Iron deficiency anemia, unspecified: Secondary | ICD-10-CM | POA: Diagnosis not present

## 2023-06-02 DIAGNOSIS — Z23 Encounter for immunization: Secondary | ICD-10-CM | POA: Diagnosis not present

## 2023-06-02 DIAGNOSIS — D689 Coagulation defect, unspecified: Secondary | ICD-10-CM | POA: Diagnosis not present

## 2023-06-02 DIAGNOSIS — N2581 Secondary hyperparathyroidism of renal origin: Secondary | ICD-10-CM | POA: Diagnosis not present

## 2023-06-02 DIAGNOSIS — Z4901 Encounter for fitting and adjustment of extracorporeal dialysis catheter: Secondary | ICD-10-CM | POA: Diagnosis not present

## 2023-06-02 DIAGNOSIS — R52 Pain, unspecified: Secondary | ICD-10-CM | POA: Diagnosis not present

## 2023-06-04 DIAGNOSIS — Z7901 Long term (current) use of anticoagulants: Secondary | ICD-10-CM | POA: Diagnosis not present

## 2023-06-04 DIAGNOSIS — I214 Non-ST elevation (NSTEMI) myocardial infarction: Secondary | ICD-10-CM | POA: Diagnosis not present

## 2023-06-04 DIAGNOSIS — J9601 Acute respiratory failure with hypoxia: Secondary | ICD-10-CM | POA: Diagnosis not present

## 2023-06-04 DIAGNOSIS — E785 Hyperlipidemia, unspecified: Secondary | ICD-10-CM | POA: Diagnosis not present

## 2023-06-04 DIAGNOSIS — I480 Paroxysmal atrial fibrillation: Secondary | ICD-10-CM | POA: Diagnosis not present

## 2023-06-04 DIAGNOSIS — Z853 Personal history of malignant neoplasm of breast: Secondary | ICD-10-CM | POA: Diagnosis not present

## 2023-06-04 DIAGNOSIS — C50911 Malignant neoplasm of unspecified site of right female breast: Secondary | ICD-10-CM | POA: Diagnosis not present

## 2023-06-04 DIAGNOSIS — I1311 Hypertensive heart and chronic kidney disease without heart failure, with stage 5 chronic kidney disease, or end stage renal disease: Secondary | ICD-10-CM | POA: Diagnosis not present

## 2023-06-04 DIAGNOSIS — N186 End stage renal disease: Secondary | ICD-10-CM | POA: Diagnosis not present

## 2023-06-04 DIAGNOSIS — Z992 Dependence on renal dialysis: Secondary | ICD-10-CM | POA: Diagnosis not present

## 2023-06-04 DIAGNOSIS — D500 Iron deficiency anemia secondary to blood loss (chronic): Secondary | ICD-10-CM | POA: Diagnosis not present

## 2023-06-04 DIAGNOSIS — Z7982 Long term (current) use of aspirin: Secondary | ICD-10-CM | POA: Diagnosis not present

## 2023-06-05 ENCOUNTER — Other Ambulatory Visit (HOSPITAL_COMMUNITY): Payer: Self-pay

## 2023-06-05 ENCOUNTER — Other Ambulatory Visit: Payer: Self-pay | Admitting: Internal Medicine

## 2023-06-05 DIAGNOSIS — Z23 Encounter for immunization: Secondary | ICD-10-CM | POA: Diagnosis not present

## 2023-06-05 DIAGNOSIS — R52 Pain, unspecified: Secondary | ICD-10-CM | POA: Diagnosis not present

## 2023-06-05 DIAGNOSIS — N2581 Secondary hyperparathyroidism of renal origin: Secondary | ICD-10-CM | POA: Diagnosis not present

## 2023-06-05 DIAGNOSIS — D689 Coagulation defect, unspecified: Secondary | ICD-10-CM | POA: Diagnosis not present

## 2023-06-05 DIAGNOSIS — Z992 Dependence on renal dialysis: Secondary | ICD-10-CM | POA: Diagnosis not present

## 2023-06-05 DIAGNOSIS — N186 End stage renal disease: Secondary | ICD-10-CM | POA: Diagnosis not present

## 2023-06-05 DIAGNOSIS — Z4901 Encounter for fitting and adjustment of extracorporeal dialysis catheter: Secondary | ICD-10-CM | POA: Diagnosis not present

## 2023-06-05 DIAGNOSIS — D509 Iron deficiency anemia, unspecified: Secondary | ICD-10-CM | POA: Diagnosis not present

## 2023-06-05 MED ORDER — METOPROLOL SUCCINATE ER 100 MG PO TB24
100.0000 mg | ORAL_TABLET | Freq: Every day | ORAL | 3 refills | Status: DC
  Filled 2023-06-05: qty 90, 90d supply, fill #0

## 2023-06-05 MED ORDER — AMIODARONE HCL 200 MG PO TABS
200.0000 mg | ORAL_TABLET | Freq: Every day | ORAL | 3 refills | Status: DC
  Filled 2023-06-05: qty 90, 90d supply, fill #0

## 2023-06-05 MED ORDER — APIXABAN 5 MG PO TABS
5.0000 mg | ORAL_TABLET | Freq: Two times a day (BID) | ORAL | 1 refills | Status: DC
  Filled 2023-06-05: qty 180, 90d supply, fill #0

## 2023-06-05 MED ORDER — ATORVASTATIN CALCIUM 80 MG PO TABS
80.0000 mg | ORAL_TABLET | Freq: Every morning | ORAL | 3 refills | Status: DC
  Filled 2023-06-05: qty 90, 90d supply, fill #0

## 2023-06-05 MED ORDER — PANTOPRAZOLE SODIUM 40 MG PO TBEC
40.0000 mg | DELAYED_RELEASE_TABLET | Freq: Every day | ORAL | 3 refills | Status: AC
  Filled 2023-06-05 – 2024-05-08 (×2): qty 90, 90d supply, fill #0

## 2023-06-05 NOTE — Telephone Encounter (Signed)
 All Pts RX's are reordered. Sent Eliquis to Coumadin. Please see PCP for Ropinirole refill.

## 2023-06-05 NOTE — Telephone Encounter (Signed)
*  STAT* If patient is at the pharmacy, call can be transferred to refill team.   1. Which medications need to be refilled? (please list name of each medication and dose if known)   amiodarone (PACERONE) 200 MG tablet  atorvastatin (LIPITOR) 80 MG tablet  metoprolol succinate (TOPROL-XL) 100 MG 24 hr tablet  pantoprazole (PROTONIX) 40 MG tablet  apixaban (ELIQUIS) 5 MG TABS tablet  rOPINIRole (REQUIP) 0.5 MG tablet   2. Would you like to learn more about the convenience, safety, & potential cost savings by using the [facility]?   3. Are you open to using the [facility] (Type [facility]. ).  4. Which pharmacy/location (including street and city if local pharmacy) is medication to be sent to?  [id number] - [place], [place] - [address]   5. Do they need a 30 day or 90 day supply?   30 day  Patient is completely out of these medications.  Patient has appointment scheduled on 2/28.

## 2023-06-05 NOTE — Telephone Encounter (Signed)
 Prescription refill request for Eliquis received. Indication: AF Last office visit: 05/10/23  Inda Coke NP Scr: 6.10 on 05/10/23  Epic Age: 78 Weight: 64.7kg  Based on above findings Eliquis 5mg  twice daily is the appropriate dose.  Refill approved.

## 2023-06-06 DIAGNOSIS — I10 Essential (primary) hypertension: Secondary | ICD-10-CM | POA: Diagnosis not present

## 2023-06-06 DIAGNOSIS — C50919 Malignant neoplasm of unspecified site of unspecified female breast: Secondary | ICD-10-CM | POA: Diagnosis not present

## 2023-06-06 DIAGNOSIS — Z992 Dependence on renal dialysis: Secondary | ICD-10-CM | POA: Diagnosis not present

## 2023-06-06 DIAGNOSIS — E559 Vitamin D deficiency, unspecified: Secondary | ICD-10-CM | POA: Diagnosis not present

## 2023-06-06 DIAGNOSIS — N186 End stage renal disease: Secondary | ICD-10-CM | POA: Diagnosis not present

## 2023-06-06 DIAGNOSIS — G2581 Restless legs syndrome: Secondary | ICD-10-CM | POA: Diagnosis not present

## 2023-06-06 DIAGNOSIS — I252 Old myocardial infarction: Secondary | ICD-10-CM | POA: Diagnosis not present

## 2023-06-06 DIAGNOSIS — G4700 Insomnia, unspecified: Secondary | ICD-10-CM | POA: Diagnosis not present

## 2023-06-06 DIAGNOSIS — I2510 Atherosclerotic heart disease of native coronary artery without angina pectoris: Secondary | ICD-10-CM | POA: Diagnosis not present

## 2023-06-06 DIAGNOSIS — I4892 Unspecified atrial flutter: Secondary | ICD-10-CM | POA: Diagnosis not present

## 2023-06-06 DIAGNOSIS — I700 Atherosclerosis of aorta: Secondary | ICD-10-CM | POA: Diagnosis not present

## 2023-06-07 DIAGNOSIS — D509 Iron deficiency anemia, unspecified: Secondary | ICD-10-CM | POA: Diagnosis not present

## 2023-06-07 DIAGNOSIS — N2581 Secondary hyperparathyroidism of renal origin: Secondary | ICD-10-CM | POA: Diagnosis not present

## 2023-06-07 DIAGNOSIS — Z23 Encounter for immunization: Secondary | ICD-10-CM | POA: Diagnosis not present

## 2023-06-07 DIAGNOSIS — Z4901 Encounter for fitting and adjustment of extracorporeal dialysis catheter: Secondary | ICD-10-CM | POA: Diagnosis not present

## 2023-06-07 DIAGNOSIS — R52 Pain, unspecified: Secondary | ICD-10-CM | POA: Diagnosis not present

## 2023-06-07 DIAGNOSIS — Z992 Dependence on renal dialysis: Secondary | ICD-10-CM | POA: Diagnosis not present

## 2023-06-07 DIAGNOSIS — N186 End stage renal disease: Secondary | ICD-10-CM | POA: Diagnosis not present

## 2023-06-07 DIAGNOSIS — D689 Coagulation defect, unspecified: Secondary | ICD-10-CM | POA: Diagnosis not present

## 2023-06-07 NOTE — Progress Notes (Unsigned)
 Cardiology Office Note    Patient Name: Rachel Villarreal Date of Encounter: 06/07/2023  Primary Care Provider:  Georgann Housekeeper, MD Primary Cardiologist:  Christell Constant, MD Primary Electrophysiologist: None   Past Medical History    Past Medical History:  Diagnosis Date   Abnormal Pap smear of vagina    Anemia    Asthma    Mild   Cancer (HCC)    left breast ILC   Chronic kidney disease (CKD)    stage 4   GERD (gastroesophageal reflux disease)    Gout 05/2012   Hernia, incisional    History of abnormal Pap smear    CIN III, 2001   History of blood transfusion    POST OP   History of uterine fibroid    Hypertension    Peritonitis (HCC)    Respiratory failure (HCC)    after ruptured appendix   Ruptured appendix    Seasonal allergies    Shingles 02/2013   very mild case   Urine incontinence     History of Present Illness  Rachel Villarreal is a 78 y.o. female with a PMH of CAD s/p NSTEMI with LHC showing single-vessel disease in mid circumflex at 85% treated medically, HFrEF, paroxysmal AF, essential HTN, HLD, breast CA, GERD, asthma ESRD (Monday, Wednesday, Friday) who presents today for follow-up.  Rachel Villarreal was last seen on 05/10/2023 for hospital follow-up.  She reported doing well but did experience a few episodes of tachycardia within 2 to 3 hours.  She was continued on current rate control with amiodarone and Toprol-XL.  She was advised to discontinue aspirin in 30 days.  She was euvolemic and blood pressure was stable with plan to increase GDMT as blood pressure improves.  Rachel Villarreal presents today for 1 month follow-up. The patient was previously on aspirin, which has been discontinued. The patient reports no recent episodes of tachycardia since the last visit. The patient is currently on amiodarone and metoprolol for heart rate control.  Blood pressure today is stable at 130/70 and heart rate is 72 bpm.  The patient also reports occasional low blood pressure  episodes post-dialysis, which may be contributing to the heart rate changes. The patient's fistula for dialysis is maturing well and is being used for dialysis at next visit. The patient also reports occasional wrist pain, possibly due to carpal tunnel syndrome. Patient denies chest pain, palpitations, dyspnea, PND, orthopnea, nausea, vomiting, dizziness, syncope, edema, weight gain, or early satiety.  Review of Systems  Please see the history of present illness.    All other systems reviewed and are otherwise negative except as noted above.  Physical Exam    Wt Readings from Last 3 Encounters:  05/21/23 142 lb 1.6 oz (64.5 kg)  05/10/23 142 lb 9.6 oz (64.7 kg)  05/09/23 140 lb 6.4 oz (63.7 kg)   VH:QIONG were no vitals filed for this visit.,There is no height or weight on file to calculate BMI. GEN: Well nourished, well developed in no acute distress Neck: No JVD; No carotid bruits Pulmonary: Clear to auscultation without rales, wheezing or rhonchi  Cardiovascular: Normal rate. Regular rhythm. Normal S1. Normal S2.   Murmurs: There is no murmur.  ABDOMEN: Soft, non-tender, non-distended EXTREMITIES:  No edema; No deformity   EKG/LABS/ Recent Cardiac Studies   ECG personally reviewed by me today -none completed today  Risk Assessment/Calculations:    CHA2DS2-VASc Score = 6   This indicates a 9.7% annual risk of stroke. The  patient's score is based upon: CHF History: 1 HTN History: 1 Diabetes History: 0 Stroke History: 0 Vascular Disease History: 1 Age Score: 2 Gender Score: 1         Lab Results  Component Value Date   WBC 14.1 (H) 04/25/2023   HGB 11.1 (L) 04/25/2023   HCT 32.5 (L) 04/25/2023   MCV 92.9 04/25/2023   PLT 282 04/25/2023   Lab Results  Component Value Date   CREATININE 6.10 (H) 05/10/2023   BUN 40 (H) 05/10/2023   NA 141 05/10/2023   K 3.8 05/10/2023   CL 97 05/10/2023   CO2 24 05/10/2023   Lab Results  Component Value Date   CHOL 137  04/17/2023   HDL 36 (L) 04/17/2023   LDLCALC 79 04/17/2023   TRIG 108 04/17/2023   CHOLHDL 3.8 04/17/2023    Lab Results  Component Value Date   HGBA1C 5.2 04/17/2023   Assessment & Plan    1.Paroxysmal AF:  -The patient reports no recurrence of tachycardia or atrial fibrillation since her previous visit. -Plan to wean off Amiodarone due to potential long-term side effects and monitor for recurrence of AFib. -Reduce Amiodarone to 100mg  daily for 2 weeks, then discontinue. -Continue Eliquis 5 mg twice daily -Order 30-day event monitor to be used after discontinuation of Amiodarone. -Consider alternative medications such as Tikosyn or Rythmol if recurrence of AFib is noted.  2.Coronary artery disease: -Recent heart catheterization showed blockage in the pLAD to mid LAD 85% stenosed mid circumflex lesion treated medically -Reports no chest pain or angina since previous visit. -She has discontinued ASA 81 mg continue Toprol-XL milligrams daily  3.  HFrEF:  -EF of 35 as 40% with mild concentric LVH mildly reduced RV function with moderately enlarged RV, and pleural effusions.  -Patient currently remains on examination. -Patient's volume status currently managed by dialysis -Low sodium diet, fluid restriction <2L, and daily weights encouraged. Educated to contact our office for weight gain of 2 lbs overnight or 5 lbs in one week.   4.Essential hypertension:  -Patient's blood pressure today was stable at 130/70 -Continue Toprol-XL 100 mg daily  5. ESRD:  -Currently dialyzed Tuesday, Thursday, Friday -Patient reports some bouts of hypotension postdialysis -Patient advised to hold Toprol dose until following dialysis session.  Disposition: Follow-up with Christell Constant, MD or APP in 3 months    Signed, Napoleon Form, Leodis Rains, NP 06/07/2023, 9:13 AM [facility]

## 2023-06-08 ENCOUNTER — Ambulatory Visit: Payer: Medicare Other | Attending: Nurse Practitioner | Admitting: Nurse Practitioner

## 2023-06-08 ENCOUNTER — Encounter: Payer: Self-pay | Admitting: Nurse Practitioner

## 2023-06-08 VITALS — BP 130/70 | HR 72 | Ht 62.0 in

## 2023-06-08 DIAGNOSIS — N186 End stage renal disease: Secondary | ICD-10-CM

## 2023-06-08 DIAGNOSIS — I10 Essential (primary) hypertension: Secondary | ICD-10-CM

## 2023-06-08 DIAGNOSIS — I214 Non-ST elevation (NSTEMI) myocardial infarction: Secondary | ICD-10-CM | POA: Diagnosis not present

## 2023-06-08 DIAGNOSIS — I2510 Atherosclerotic heart disease of native coronary artery without angina pectoris: Secondary | ICD-10-CM | POA: Diagnosis not present

## 2023-06-08 DIAGNOSIS — C50911 Malignant neoplasm of unspecified site of right female breast: Secondary | ICD-10-CM | POA: Diagnosis not present

## 2023-06-08 DIAGNOSIS — Z853 Personal history of malignant neoplasm of breast: Secondary | ICD-10-CM | POA: Diagnosis not present

## 2023-06-08 DIAGNOSIS — Z992 Dependence on renal dialysis: Secondary | ICD-10-CM | POA: Diagnosis not present

## 2023-06-08 DIAGNOSIS — Z7982 Long term (current) use of aspirin: Secondary | ICD-10-CM | POA: Diagnosis not present

## 2023-06-08 DIAGNOSIS — E785 Hyperlipidemia, unspecified: Secondary | ICD-10-CM | POA: Diagnosis not present

## 2023-06-08 DIAGNOSIS — I120 Hypertensive chronic kidney disease with stage 5 chronic kidney disease or end stage renal disease: Secondary | ICD-10-CM | POA: Diagnosis not present

## 2023-06-08 DIAGNOSIS — I480 Paroxysmal atrial fibrillation: Secondary | ICD-10-CM | POA: Diagnosis not present

## 2023-06-08 DIAGNOSIS — I1311 Hypertensive heart and chronic kidney disease without heart failure, with stage 5 chronic kidney disease, or end stage renal disease: Secondary | ICD-10-CM | POA: Diagnosis not present

## 2023-06-08 DIAGNOSIS — J9601 Acute respiratory failure with hypoxia: Secondary | ICD-10-CM | POA: Diagnosis not present

## 2023-06-08 DIAGNOSIS — I5020 Unspecified systolic (congestive) heart failure: Secondary | ICD-10-CM | POA: Diagnosis not present

## 2023-06-08 DIAGNOSIS — D500 Iron deficiency anemia secondary to blood loss (chronic): Secondary | ICD-10-CM | POA: Diagnosis not present

## 2023-06-08 DIAGNOSIS — Z7901 Long term (current) use of anticoagulants: Secondary | ICD-10-CM | POA: Diagnosis not present

## 2023-06-08 MED ORDER — AMIODARONE HCL 200 MG PO TABS: ORAL_TABLET | ORAL | Status: DC

## 2023-06-08 NOTE — Patient Instructions (Addendum)
 Medication Instructions:  Your physician has recommended you make the following change in your medication:   STOP Aspirin  REDUCE the Amiodarone to 200 mg taking 1/2 tablet for 2 weeks then stop *If you need a refill on your cardiac medications before your next appointment, please call your pharmacy*   Lab Work: None ordered  If you have labs (blood work) drawn today and your tests are completely normal, you will receive your results only by: MyChart Message (if you have MyChart) OR A paper copy in the mail If you have any lab test that is abnormal or we need to change your treatment, we will call you to review the results.   Testing/Procedures: [employer] Cardiac Event Monitor Instructions  Your physician has requested you wear your cardiac event monitor for 30 days, (1-30). [employer] may call or text to confirm a shipping address. The monitor will be sent to a land address via UPS. [employer] will not ship a monitor to a PO BOX. It typically takes 3-5 days to receive your monitor after it has been enrolled. [employer] will assist with USPS tracking if your package is delayed. The telephone number for [employer] is [phone]. Once you have received your monitor, please review the enclosed instructions. Instruction tutorials can also be viewed under help and settings on the enclosed cell phone. Your monitor has already been registered assigning a specific monitor serial # to you.  Billing and Self Pay Discount Information  [employer] has been provided the insurance information we had on file for you.  If your insurance has been updated, please call [employer] at [phone] to provide them with your updated insurance information.   [employer] offers a discounted Self Pay option for patients who have insurance that does not cover their cardiac event monitor or patients without insurance.  The discounted cost of a Self Pay Cardiac Event Monitor would be $225.00 , if the patient  contacts [employer] at [phone] within 7 days of applying the monitor to make payment arrangements.  If the patient does not contact [employer] within 7 days of applying the monitor, the cost of the cardiac event monitor will be $350.00.  Applying the monitor  Remove cell phone from case and turn it on. The cell phone works as IT consultant and needs to be within [employer] of you at all times. The cell phone will need to be charged on a daily basis. We recommend you plug the cell phone into the enclosed charger at your bedside table every night.  Monitor batteries: You will receive two monitor batteries labelled #1 and #2. These are your recorders. Plug battery #2 onto the second connection on the enclosed charger. Keep one battery on the charger at all times. This will keep the monitor battery deactivated. It will also keep it fully charged for when you need to switch your monitor batteries. A small light will be blinking on the battery emblem when it is charging. The light on the battery emblem will remain on when the battery is fully charged.  Open package of a Monitor strip. Insert battery #1 into black hood on strip and gently squeeze monitor battery onto connection as indicated in instruction booklet. Set aside while preparing skin.  Choose location for your strip, vertical or horizontal, as indicated in the instruction booklet. Shave to remove all hair from location. There cannot be any lotions, oils, powders, or colognes on skin where monitor is to be applied. Wipe skin clean with enclosed Saline wipe. Dry skin completely.  Peel paper labeled #1 off the back of the Monitor strip exposing the adhesive. Place the monitor on the chest in the vertical or horizontal position shown in the instruction booklet. One arrow on the monitor strip must be pointing upward. Carefully remove paper labeled #2, attaching remainder of strip to your skin. Try not to create any folds or wrinkles in  the strip as you apply it.  Firmly press and release the circle in the center of the monitor battery. You will hear a small beep. This is turning the monitor battery on. The heart emblem on the monitor battery will light up every 5 seconds if the monitor battery in turned on and connected to the patient securely. Do not push and hold the circle down as this turns the monitor battery off. The cell phone will locate the monitor battery. A screen will appear on the cell phone checking the connection of your monitor strip. This may read poor connection initially but change to good connection within the next minute. Once your monitor accepts the connection you will hear a series of 3 beeps followed by a climbing crescendo of beeps. A screen will appear on the cell phone showing the two monitor strip placement options. Touch the picture that demonstrates where you applied the monitor strip.  Your monitor strip and battery are waterproof. You are able to shower, bathe, or swim with the monitor on. They just ask you do not submerge deeper than 3 feet underwater. We recommend removing the monitor if you are swimming in a lake, river, or ocean.  Your monitor battery will need to be switched to a fully charged monitor battery approximately once a week. The cell phone will alert you of an action which needs to be made.  On the cell phone, tap for details to reveal connection status, monitor battery status, and cell phone battery status. The green dots indicates your monitor is in good status. A red dot indicates there is something that needs your attention.  To record a symptom, click the circle on the monitor battery. In 30-60 seconds a list of symptoms will appear on the cell phone. Select your symptom and tap save. Your monitor will record a sustained or significant arrhythmia regardless of you clicking the button. Some patients do not feel the heart rhythm irregularities. [employer] will notify us  of any serious or critical events.  Refer to instruction booklet for instructions on switching batteries, changing strips, the Do not disturb or Pause features, or any additional questions.  Call [employer] at [phone], to confirm your monitor is transmitting and record your baseline. They will answer any questions you may have regarding the monitor instructions at that time.  Returning the monitor to [employer]  Place all equipment back into blue box. Peel off strip of paper to expose adhesive and close box securely. There is a prepaid UPS shipping label on this box. Drop in a UPS drop box, or at a UPS facility like [employer]. You may also contact [employer] to arrange UPS to pick up monitor package at your home.\   Follow-Up: At [facility], you and your health needs are our priority.  As part of our continuing mission to provide you with exceptional heart care, we have created designated Provider Care Teams.  These Care Teams include your primary Cardiologist (physician) and Advanced Practice Providers (APPs -  Physician Assistants and Nurse Practitioners) who all work together to provide you with the care you need, when you need it.  We recommend signing up for the patient portal called "MyChart".  Sign up information is provided on this After Visit Summary.  MyChart is used to connect with patients for Virtual Visits (Telemedicine).  Patients are able to view lab/test results, encounter notes, upcoming appointments, etc.  Non-urgent messages can be sent to your provider as well.   To learn more about what you can do with MyChart, go to [email or web address].    Your next appointment:   3 month(s)  Provider:   Robin Searing, NP         Other Instructions GET A KARDIA DEVICE    1st Floor: - Lobby - Registration  - Pharmacy  - Lab - Cafe  2nd Floor: - PV Lab - Diagnostic Testing (echo, CT, nuclear med)  3rd Floor: - Vacant  4th Floor: - TCTS  (cardiothoracic surgery) - AFib Clinic - Structural Heart Clinic - Vascular Surgery  - Vascular Ultrasound  5th Floor: - [facility] (general and EP) - Clinical Pharmacy for coumadin, hypertension, lipid, weight-loss medications, and med management appointments    Valet parking services will be available as well.

## 2023-06-09 DIAGNOSIS — N2581 Secondary hyperparathyroidism of renal origin: Secondary | ICD-10-CM | POA: Diagnosis not present

## 2023-06-09 DIAGNOSIS — N186 End stage renal disease: Secondary | ICD-10-CM | POA: Diagnosis not present

## 2023-06-09 DIAGNOSIS — R52 Pain, unspecified: Secondary | ICD-10-CM | POA: Diagnosis not present

## 2023-06-09 DIAGNOSIS — Z992 Dependence on renal dialysis: Secondary | ICD-10-CM | POA: Diagnosis not present

## 2023-06-09 DIAGNOSIS — D689 Coagulation defect, unspecified: Secondary | ICD-10-CM | POA: Diagnosis not present

## 2023-06-09 DIAGNOSIS — Z4901 Encounter for fitting and adjustment of extracorporeal dialysis catheter: Secondary | ICD-10-CM | POA: Diagnosis not present

## 2023-06-10 DIAGNOSIS — E785 Hyperlipidemia, unspecified: Secondary | ICD-10-CM | POA: Diagnosis not present

## 2023-06-10 DIAGNOSIS — D500 Iron deficiency anemia secondary to blood loss (chronic): Secondary | ICD-10-CM | POA: Diagnosis not present

## 2023-06-10 DIAGNOSIS — Z853 Personal history of malignant neoplasm of breast: Secondary | ICD-10-CM | POA: Diagnosis not present

## 2023-06-10 DIAGNOSIS — I214 Non-ST elevation (NSTEMI) myocardial infarction: Secondary | ICD-10-CM | POA: Diagnosis not present

## 2023-06-10 DIAGNOSIS — Z7982 Long term (current) use of aspirin: Secondary | ICD-10-CM | POA: Diagnosis not present

## 2023-06-10 DIAGNOSIS — Z992 Dependence on renal dialysis: Secondary | ICD-10-CM | POA: Diagnosis not present

## 2023-06-10 DIAGNOSIS — J9601 Acute respiratory failure with hypoxia: Secondary | ICD-10-CM | POA: Diagnosis not present

## 2023-06-10 DIAGNOSIS — I1311 Hypertensive heart and chronic kidney disease without heart failure, with stage 5 chronic kidney disease, or end stage renal disease: Secondary | ICD-10-CM | POA: Diagnosis not present

## 2023-06-10 DIAGNOSIS — C50911 Malignant neoplasm of unspecified site of right female breast: Secondary | ICD-10-CM | POA: Diagnosis not present

## 2023-06-10 DIAGNOSIS — N186 End stage renal disease: Secondary | ICD-10-CM | POA: Diagnosis not present

## 2023-06-10 DIAGNOSIS — I480 Paroxysmal atrial fibrillation: Secondary | ICD-10-CM | POA: Diagnosis not present

## 2023-06-10 DIAGNOSIS — Z7901 Long term (current) use of anticoagulants: Secondary | ICD-10-CM | POA: Diagnosis not present

## 2023-06-11 DIAGNOSIS — I1311 Hypertensive heart and chronic kidney disease without heart failure, with stage 5 chronic kidney disease, or end stage renal disease: Secondary | ICD-10-CM | POA: Diagnosis not present

## 2023-06-11 DIAGNOSIS — I480 Paroxysmal atrial fibrillation: Secondary | ICD-10-CM | POA: Diagnosis not present

## 2023-06-11 DIAGNOSIS — I214 Non-ST elevation (NSTEMI) myocardial infarction: Secondary | ICD-10-CM | POA: Diagnosis not present

## 2023-06-11 DIAGNOSIS — N2581 Secondary hyperparathyroidism of renal origin: Secondary | ICD-10-CM | POA: Diagnosis not present

## 2023-06-11 DIAGNOSIS — D689 Coagulation defect, unspecified: Secondary | ICD-10-CM | POA: Diagnosis not present

## 2023-06-11 DIAGNOSIS — D500 Iron deficiency anemia secondary to blood loss (chronic): Secondary | ICD-10-CM | POA: Diagnosis not present

## 2023-06-11 DIAGNOSIS — R52 Pain, unspecified: Secondary | ICD-10-CM | POA: Diagnosis not present

## 2023-06-11 DIAGNOSIS — J9601 Acute respiratory failure with hypoxia: Secondary | ICD-10-CM | POA: Diagnosis not present

## 2023-06-11 DIAGNOSIS — Z7901 Long term (current) use of anticoagulants: Secondary | ICD-10-CM | POA: Diagnosis not present

## 2023-06-11 DIAGNOSIS — Z4901 Encounter for fitting and adjustment of extracorporeal dialysis catheter: Secondary | ICD-10-CM | POA: Diagnosis not present

## 2023-06-11 DIAGNOSIS — C50911 Malignant neoplasm of unspecified site of right female breast: Secondary | ICD-10-CM | POA: Diagnosis not present

## 2023-06-11 DIAGNOSIS — Z992 Dependence on renal dialysis: Secondary | ICD-10-CM | POA: Diagnosis not present

## 2023-06-11 DIAGNOSIS — N186 End stage renal disease: Secondary | ICD-10-CM | POA: Diagnosis not present

## 2023-06-11 DIAGNOSIS — Z7982 Long term (current) use of aspirin: Secondary | ICD-10-CM | POA: Diagnosis not present

## 2023-06-11 DIAGNOSIS — Z853 Personal history of malignant neoplasm of breast: Secondary | ICD-10-CM | POA: Diagnosis not present

## 2023-06-11 DIAGNOSIS — E785 Hyperlipidemia, unspecified: Secondary | ICD-10-CM | POA: Diagnosis not present

## 2023-06-12 DIAGNOSIS — Z992 Dependence on renal dialysis: Secondary | ICD-10-CM | POA: Diagnosis not present

## 2023-06-12 DIAGNOSIS — D689 Coagulation defect, unspecified: Secondary | ICD-10-CM | POA: Diagnosis not present

## 2023-06-12 DIAGNOSIS — Z4901 Encounter for fitting and adjustment of extracorporeal dialysis catheter: Secondary | ICD-10-CM | POA: Diagnosis not present

## 2023-06-12 DIAGNOSIS — N186 End stage renal disease: Secondary | ICD-10-CM | POA: Diagnosis not present

## 2023-06-12 DIAGNOSIS — R52 Pain, unspecified: Secondary | ICD-10-CM | POA: Diagnosis not present

## 2023-06-12 DIAGNOSIS — N2581 Secondary hyperparathyroidism of renal origin: Secondary | ICD-10-CM | POA: Diagnosis not present

## 2023-06-14 DIAGNOSIS — Z992 Dependence on renal dialysis: Secondary | ICD-10-CM | POA: Diagnosis not present

## 2023-06-14 DIAGNOSIS — N186 End stage renal disease: Secondary | ICD-10-CM | POA: Diagnosis not present

## 2023-06-14 DIAGNOSIS — Z4901 Encounter for fitting and adjustment of extracorporeal dialysis catheter: Secondary | ICD-10-CM | POA: Diagnosis not present

## 2023-06-14 DIAGNOSIS — N2581 Secondary hyperparathyroidism of renal origin: Secondary | ICD-10-CM | POA: Diagnosis not present

## 2023-06-14 DIAGNOSIS — R52 Pain, unspecified: Secondary | ICD-10-CM | POA: Diagnosis not present

## 2023-06-14 DIAGNOSIS — D689 Coagulation defect, unspecified: Secondary | ICD-10-CM | POA: Diagnosis not present

## 2023-06-15 DIAGNOSIS — I480 Paroxysmal atrial fibrillation: Secondary | ICD-10-CM | POA: Diagnosis not present

## 2023-06-16 DIAGNOSIS — Z992 Dependence on renal dialysis: Secondary | ICD-10-CM | POA: Diagnosis not present

## 2023-06-16 DIAGNOSIS — N2581 Secondary hyperparathyroidism of renal origin: Secondary | ICD-10-CM | POA: Diagnosis not present

## 2023-06-16 DIAGNOSIS — Z4901 Encounter for fitting and adjustment of extracorporeal dialysis catheter: Secondary | ICD-10-CM | POA: Diagnosis not present

## 2023-06-16 DIAGNOSIS — R52 Pain, unspecified: Secondary | ICD-10-CM | POA: Diagnosis not present

## 2023-06-16 DIAGNOSIS — D689 Coagulation defect, unspecified: Secondary | ICD-10-CM | POA: Diagnosis not present

## 2023-06-16 DIAGNOSIS — N186 End stage renal disease: Secondary | ICD-10-CM | POA: Diagnosis not present

## 2023-06-17 DIAGNOSIS — I214 Non-ST elevation (NSTEMI) myocardial infarction: Secondary | ICD-10-CM | POA: Diagnosis not present

## 2023-06-17 DIAGNOSIS — I1311 Hypertensive heart and chronic kidney disease without heart failure, with stage 5 chronic kidney disease, or end stage renal disease: Secondary | ICD-10-CM | POA: Diagnosis not present

## 2023-06-17 DIAGNOSIS — Z992 Dependence on renal dialysis: Secondary | ICD-10-CM | POA: Diagnosis not present

## 2023-06-17 DIAGNOSIS — I480 Paroxysmal atrial fibrillation: Secondary | ICD-10-CM | POA: Diagnosis not present

## 2023-06-17 DIAGNOSIS — J9601 Acute respiratory failure with hypoxia: Secondary | ICD-10-CM | POA: Diagnosis not present

## 2023-06-17 DIAGNOSIS — Z853 Personal history of malignant neoplasm of breast: Secondary | ICD-10-CM | POA: Diagnosis not present

## 2023-06-17 DIAGNOSIS — C50911 Malignant neoplasm of unspecified site of right female breast: Secondary | ICD-10-CM | POA: Diagnosis not present

## 2023-06-17 DIAGNOSIS — N186 End stage renal disease: Secondary | ICD-10-CM | POA: Diagnosis not present

## 2023-06-17 DIAGNOSIS — Z7901 Long term (current) use of anticoagulants: Secondary | ICD-10-CM | POA: Diagnosis not present

## 2023-06-17 DIAGNOSIS — E785 Hyperlipidemia, unspecified: Secondary | ICD-10-CM | POA: Diagnosis not present

## 2023-06-17 DIAGNOSIS — D500 Iron deficiency anemia secondary to blood loss (chronic): Secondary | ICD-10-CM | POA: Diagnosis not present

## 2023-06-17 DIAGNOSIS — Z7982 Long term (current) use of aspirin: Secondary | ICD-10-CM | POA: Diagnosis not present

## 2023-06-18 DIAGNOSIS — I1311 Hypertensive heart and chronic kidney disease without heart failure, with stage 5 chronic kidney disease, or end stage renal disease: Secondary | ICD-10-CM | POA: Diagnosis not present

## 2023-06-18 DIAGNOSIS — E785 Hyperlipidemia, unspecified: Secondary | ICD-10-CM | POA: Diagnosis not present

## 2023-06-18 DIAGNOSIS — Z992 Dependence on renal dialysis: Secondary | ICD-10-CM | POA: Diagnosis not present

## 2023-06-18 DIAGNOSIS — C50911 Malignant neoplasm of unspecified site of right female breast: Secondary | ICD-10-CM | POA: Diagnosis not present

## 2023-06-18 DIAGNOSIS — D500 Iron deficiency anemia secondary to blood loss (chronic): Secondary | ICD-10-CM | POA: Diagnosis not present

## 2023-06-18 DIAGNOSIS — Z853 Personal history of malignant neoplasm of breast: Secondary | ICD-10-CM | POA: Diagnosis not present

## 2023-06-18 DIAGNOSIS — I480 Paroxysmal atrial fibrillation: Secondary | ICD-10-CM | POA: Diagnosis not present

## 2023-06-18 DIAGNOSIS — Z7901 Long term (current) use of anticoagulants: Secondary | ICD-10-CM | POA: Diagnosis not present

## 2023-06-18 DIAGNOSIS — Z7982 Long term (current) use of aspirin: Secondary | ICD-10-CM | POA: Diagnosis not present

## 2023-06-18 DIAGNOSIS — I214 Non-ST elevation (NSTEMI) myocardial infarction: Secondary | ICD-10-CM | POA: Diagnosis not present

## 2023-06-18 DIAGNOSIS — N186 End stage renal disease: Secondary | ICD-10-CM | POA: Diagnosis not present

## 2023-06-18 DIAGNOSIS — J9601 Acute respiratory failure with hypoxia: Secondary | ICD-10-CM | POA: Diagnosis not present

## 2023-06-19 DIAGNOSIS — R52 Pain, unspecified: Secondary | ICD-10-CM | POA: Diagnosis not present

## 2023-06-19 DIAGNOSIS — N186 End stage renal disease: Secondary | ICD-10-CM | POA: Diagnosis not present

## 2023-06-19 DIAGNOSIS — Z4901 Encounter for fitting and adjustment of extracorporeal dialysis catheter: Secondary | ICD-10-CM | POA: Diagnosis not present

## 2023-06-19 DIAGNOSIS — Z992 Dependence on renal dialysis: Secondary | ICD-10-CM | POA: Diagnosis not present

## 2023-06-19 DIAGNOSIS — D689 Coagulation defect, unspecified: Secondary | ICD-10-CM | POA: Diagnosis not present

## 2023-06-19 DIAGNOSIS — N2581 Secondary hyperparathyroidism of renal origin: Secondary | ICD-10-CM | POA: Diagnosis not present

## 2023-06-21 DIAGNOSIS — Z992 Dependence on renal dialysis: Secondary | ICD-10-CM | POA: Diagnosis not present

## 2023-06-21 DIAGNOSIS — N2581 Secondary hyperparathyroidism of renal origin: Secondary | ICD-10-CM | POA: Diagnosis not present

## 2023-06-21 DIAGNOSIS — Z4901 Encounter for fitting and adjustment of extracorporeal dialysis catheter: Secondary | ICD-10-CM | POA: Diagnosis not present

## 2023-06-21 DIAGNOSIS — N186 End stage renal disease: Secondary | ICD-10-CM | POA: Diagnosis not present

## 2023-06-21 DIAGNOSIS — R52 Pain, unspecified: Secondary | ICD-10-CM | POA: Diagnosis not present

## 2023-06-21 DIAGNOSIS — D689 Coagulation defect, unspecified: Secondary | ICD-10-CM | POA: Diagnosis not present

## 2023-06-22 DIAGNOSIS — H353132 Nonexudative age-related macular degeneration, bilateral, intermediate dry stage: Secondary | ICD-10-CM | POA: Diagnosis not present

## 2023-06-22 DIAGNOSIS — Z961 Presence of intraocular lens: Secondary | ICD-10-CM | POA: Diagnosis not present

## 2023-06-22 DIAGNOSIS — H401131 Primary open-angle glaucoma, bilateral, mild stage: Secondary | ICD-10-CM | POA: Diagnosis not present

## 2023-06-23 DIAGNOSIS — Z992 Dependence on renal dialysis: Secondary | ICD-10-CM | POA: Diagnosis not present

## 2023-06-23 DIAGNOSIS — N186 End stage renal disease: Secondary | ICD-10-CM | POA: Diagnosis not present

## 2023-06-23 DIAGNOSIS — D689 Coagulation defect, unspecified: Secondary | ICD-10-CM | POA: Diagnosis not present

## 2023-06-23 DIAGNOSIS — R52 Pain, unspecified: Secondary | ICD-10-CM | POA: Diagnosis not present

## 2023-06-23 DIAGNOSIS — Z4901 Encounter for fitting and adjustment of extracorporeal dialysis catheter: Secondary | ICD-10-CM | POA: Diagnosis not present

## 2023-06-23 DIAGNOSIS — N2581 Secondary hyperparathyroidism of renal origin: Secondary | ICD-10-CM | POA: Diagnosis not present

## 2023-06-25 ENCOUNTER — Other Ambulatory Visit: Payer: Self-pay | Admitting: Hematology and Oncology

## 2023-06-26 DIAGNOSIS — Z4901 Encounter for fitting and adjustment of extracorporeal dialysis catheter: Secondary | ICD-10-CM | POA: Diagnosis not present

## 2023-06-26 DIAGNOSIS — R52 Pain, unspecified: Secondary | ICD-10-CM | POA: Diagnosis not present

## 2023-06-26 DIAGNOSIS — N186 End stage renal disease: Secondary | ICD-10-CM | POA: Diagnosis not present

## 2023-06-26 DIAGNOSIS — Z992 Dependence on renal dialysis: Secondary | ICD-10-CM | POA: Diagnosis not present

## 2023-06-26 DIAGNOSIS — N2581 Secondary hyperparathyroidism of renal origin: Secondary | ICD-10-CM | POA: Diagnosis not present

## 2023-06-26 DIAGNOSIS — D689 Coagulation defect, unspecified: Secondary | ICD-10-CM | POA: Diagnosis not present

## 2023-06-28 DIAGNOSIS — D689 Coagulation defect, unspecified: Secondary | ICD-10-CM | POA: Diagnosis not present

## 2023-06-28 DIAGNOSIS — R52 Pain, unspecified: Secondary | ICD-10-CM | POA: Diagnosis not present

## 2023-06-28 DIAGNOSIS — Z992 Dependence on renal dialysis: Secondary | ICD-10-CM | POA: Diagnosis not present

## 2023-06-28 DIAGNOSIS — Z4901 Encounter for fitting and adjustment of extracorporeal dialysis catheter: Secondary | ICD-10-CM | POA: Diagnosis not present

## 2023-06-28 DIAGNOSIS — N186 End stage renal disease: Secondary | ICD-10-CM | POA: Diagnosis not present

## 2023-06-28 DIAGNOSIS — N2581 Secondary hyperparathyroidism of renal origin: Secondary | ICD-10-CM | POA: Diagnosis not present

## 2023-06-29 DIAGNOSIS — L821 Other seborrheic keratosis: Secondary | ICD-10-CM | POA: Diagnosis not present

## 2023-06-29 DIAGNOSIS — L578 Other skin changes due to chronic exposure to nonionizing radiation: Secondary | ICD-10-CM | POA: Diagnosis not present

## 2023-06-29 DIAGNOSIS — L820 Inflamed seborrheic keratosis: Secondary | ICD-10-CM | POA: Diagnosis not present

## 2023-06-29 DIAGNOSIS — L814 Other melanin hyperpigmentation: Secondary | ICD-10-CM | POA: Diagnosis not present

## 2023-06-29 DIAGNOSIS — D225 Melanocytic nevi of trunk: Secondary | ICD-10-CM | POA: Diagnosis not present

## 2023-06-29 DIAGNOSIS — Z86018 Personal history of other benign neoplasm: Secondary | ICD-10-CM | POA: Diagnosis not present

## 2023-06-29 DIAGNOSIS — D485 Neoplasm of uncertain behavior of skin: Secondary | ICD-10-CM | POA: Diagnosis not present

## 2023-06-30 DIAGNOSIS — Z4901 Encounter for fitting and adjustment of extracorporeal dialysis catheter: Secondary | ICD-10-CM | POA: Diagnosis not present

## 2023-06-30 DIAGNOSIS — D689 Coagulation defect, unspecified: Secondary | ICD-10-CM | POA: Diagnosis not present

## 2023-06-30 DIAGNOSIS — Z992 Dependence on renal dialysis: Secondary | ICD-10-CM | POA: Diagnosis not present

## 2023-06-30 DIAGNOSIS — N186 End stage renal disease: Secondary | ICD-10-CM | POA: Diagnosis not present

## 2023-06-30 DIAGNOSIS — R52 Pain, unspecified: Secondary | ICD-10-CM | POA: Diagnosis not present

## 2023-06-30 DIAGNOSIS — N2581 Secondary hyperparathyroidism of renal origin: Secondary | ICD-10-CM | POA: Diagnosis not present

## 2023-07-01 DIAGNOSIS — Z853 Personal history of malignant neoplasm of breast: Secondary | ICD-10-CM | POA: Diagnosis not present

## 2023-07-01 DIAGNOSIS — E785 Hyperlipidemia, unspecified: Secondary | ICD-10-CM | POA: Diagnosis not present

## 2023-07-01 DIAGNOSIS — N186 End stage renal disease: Secondary | ICD-10-CM | POA: Diagnosis not present

## 2023-07-01 DIAGNOSIS — D500 Iron deficiency anemia secondary to blood loss (chronic): Secondary | ICD-10-CM | POA: Diagnosis not present

## 2023-07-01 DIAGNOSIS — Z992 Dependence on renal dialysis: Secondary | ICD-10-CM | POA: Diagnosis not present

## 2023-07-01 DIAGNOSIS — C50911 Malignant neoplasm of unspecified site of right female breast: Secondary | ICD-10-CM | POA: Diagnosis not present

## 2023-07-01 DIAGNOSIS — I480 Paroxysmal atrial fibrillation: Secondary | ICD-10-CM | POA: Diagnosis not present

## 2023-07-01 DIAGNOSIS — I1311 Hypertensive heart and chronic kidney disease without heart failure, with stage 5 chronic kidney disease, or end stage renal disease: Secondary | ICD-10-CM | POA: Diagnosis not present

## 2023-07-01 DIAGNOSIS — J9601 Acute respiratory failure with hypoxia: Secondary | ICD-10-CM | POA: Diagnosis not present

## 2023-07-01 DIAGNOSIS — I214 Non-ST elevation (NSTEMI) myocardial infarction: Secondary | ICD-10-CM | POA: Diagnosis not present

## 2023-07-01 DIAGNOSIS — Z7901 Long term (current) use of anticoagulants: Secondary | ICD-10-CM | POA: Diagnosis not present

## 2023-07-01 DIAGNOSIS — Z7982 Long term (current) use of aspirin: Secondary | ICD-10-CM | POA: Diagnosis not present

## 2023-07-03 DIAGNOSIS — D689 Coagulation defect, unspecified: Secondary | ICD-10-CM | POA: Diagnosis not present

## 2023-07-03 DIAGNOSIS — Z992 Dependence on renal dialysis: Secondary | ICD-10-CM | POA: Diagnosis not present

## 2023-07-03 DIAGNOSIS — R52 Pain, unspecified: Secondary | ICD-10-CM | POA: Diagnosis not present

## 2023-07-03 DIAGNOSIS — Z4901 Encounter for fitting and adjustment of extracorporeal dialysis catheter: Secondary | ICD-10-CM | POA: Diagnosis not present

## 2023-07-03 DIAGNOSIS — N186 End stage renal disease: Secondary | ICD-10-CM | POA: Diagnosis not present

## 2023-07-03 DIAGNOSIS — N2581 Secondary hyperparathyroidism of renal origin: Secondary | ICD-10-CM | POA: Diagnosis not present

## 2023-07-05 DIAGNOSIS — D689 Coagulation defect, unspecified: Secondary | ICD-10-CM | POA: Diagnosis not present

## 2023-07-05 DIAGNOSIS — N2581 Secondary hyperparathyroidism of renal origin: Secondary | ICD-10-CM | POA: Diagnosis not present

## 2023-07-05 DIAGNOSIS — R52 Pain, unspecified: Secondary | ICD-10-CM | POA: Diagnosis not present

## 2023-07-05 DIAGNOSIS — N186 End stage renal disease: Secondary | ICD-10-CM | POA: Diagnosis not present

## 2023-07-05 DIAGNOSIS — Z4901 Encounter for fitting and adjustment of extracorporeal dialysis catheter: Secondary | ICD-10-CM | POA: Diagnosis not present

## 2023-07-05 DIAGNOSIS — Z992 Dependence on renal dialysis: Secondary | ICD-10-CM | POA: Diagnosis not present

## 2023-07-07 DIAGNOSIS — N2581 Secondary hyperparathyroidism of renal origin: Secondary | ICD-10-CM | POA: Diagnosis not present

## 2023-07-07 DIAGNOSIS — D689 Coagulation defect, unspecified: Secondary | ICD-10-CM | POA: Diagnosis not present

## 2023-07-07 DIAGNOSIS — Z4901 Encounter for fitting and adjustment of extracorporeal dialysis catheter: Secondary | ICD-10-CM | POA: Diagnosis not present

## 2023-07-07 DIAGNOSIS — Z992 Dependence on renal dialysis: Secondary | ICD-10-CM | POA: Diagnosis not present

## 2023-07-07 DIAGNOSIS — N186 End stage renal disease: Secondary | ICD-10-CM | POA: Diagnosis not present

## 2023-07-07 DIAGNOSIS — R52 Pain, unspecified: Secondary | ICD-10-CM | POA: Diagnosis not present

## 2023-07-09 DIAGNOSIS — N186 End stage renal disease: Secondary | ICD-10-CM | POA: Diagnosis not present

## 2023-07-09 DIAGNOSIS — Z992 Dependence on renal dialysis: Secondary | ICD-10-CM | POA: Diagnosis not present

## 2023-07-09 DIAGNOSIS — I120 Hypertensive chronic kidney disease with stage 5 chronic kidney disease or end stage renal disease: Secondary | ICD-10-CM | POA: Diagnosis not present

## 2023-07-10 DIAGNOSIS — Z992 Dependence on renal dialysis: Secondary | ICD-10-CM | POA: Diagnosis not present

## 2023-07-10 DIAGNOSIS — D509 Iron deficiency anemia, unspecified: Secondary | ICD-10-CM | POA: Diagnosis not present

## 2023-07-10 DIAGNOSIS — Z4901 Encounter for fitting and adjustment of extracorporeal dialysis catheter: Secondary | ICD-10-CM | POA: Diagnosis not present

## 2023-07-10 DIAGNOSIS — N186 End stage renal disease: Secondary | ICD-10-CM | POA: Diagnosis not present

## 2023-07-10 DIAGNOSIS — N2581 Secondary hyperparathyroidism of renal origin: Secondary | ICD-10-CM | POA: Diagnosis not present

## 2023-07-10 DIAGNOSIS — D689 Coagulation defect, unspecified: Secondary | ICD-10-CM | POA: Diagnosis not present

## 2023-07-11 DIAGNOSIS — C50412 Malignant neoplasm of upper-outer quadrant of left female breast: Secondary | ICD-10-CM | POA: Diagnosis not present

## 2023-07-12 DIAGNOSIS — Z992 Dependence on renal dialysis: Secondary | ICD-10-CM | POA: Diagnosis not present

## 2023-07-12 DIAGNOSIS — Z4901 Encounter for fitting and adjustment of extracorporeal dialysis catheter: Secondary | ICD-10-CM | POA: Diagnosis not present

## 2023-07-12 DIAGNOSIS — D509 Iron deficiency anemia, unspecified: Secondary | ICD-10-CM | POA: Diagnosis not present

## 2023-07-12 DIAGNOSIS — D689 Coagulation defect, unspecified: Secondary | ICD-10-CM | POA: Diagnosis not present

## 2023-07-12 DIAGNOSIS — N186 End stage renal disease: Secondary | ICD-10-CM | POA: Diagnosis not present

## 2023-07-12 DIAGNOSIS — N2581 Secondary hyperparathyroidism of renal origin: Secondary | ICD-10-CM | POA: Diagnosis not present

## 2023-07-14 DIAGNOSIS — Z992 Dependence on renal dialysis: Secondary | ICD-10-CM | POA: Diagnosis not present

## 2023-07-14 DIAGNOSIS — D509 Iron deficiency anemia, unspecified: Secondary | ICD-10-CM | POA: Diagnosis not present

## 2023-07-14 DIAGNOSIS — N2581 Secondary hyperparathyroidism of renal origin: Secondary | ICD-10-CM | POA: Diagnosis not present

## 2023-07-14 DIAGNOSIS — Z4901 Encounter for fitting and adjustment of extracorporeal dialysis catheter: Secondary | ICD-10-CM | POA: Diagnosis not present

## 2023-07-14 DIAGNOSIS — N186 End stage renal disease: Secondary | ICD-10-CM | POA: Diagnosis not present

## 2023-07-14 DIAGNOSIS — D689 Coagulation defect, unspecified: Secondary | ICD-10-CM | POA: Diagnosis not present

## 2023-07-17 ENCOUNTER — Ambulatory Visit: Attending: Nurse Practitioner

## 2023-07-17 DIAGNOSIS — N186 End stage renal disease: Secondary | ICD-10-CM | POA: Diagnosis not present

## 2023-07-17 DIAGNOSIS — I5020 Unspecified systolic (congestive) heart failure: Secondary | ICD-10-CM

## 2023-07-17 DIAGNOSIS — D689 Coagulation defect, unspecified: Secondary | ICD-10-CM | POA: Diagnosis not present

## 2023-07-17 DIAGNOSIS — Z992 Dependence on renal dialysis: Secondary | ICD-10-CM | POA: Diagnosis not present

## 2023-07-17 DIAGNOSIS — D509 Iron deficiency anemia, unspecified: Secondary | ICD-10-CM | POA: Diagnosis not present

## 2023-07-17 DIAGNOSIS — I2510 Atherosclerotic heart disease of native coronary artery without angina pectoris: Secondary | ICD-10-CM

## 2023-07-17 DIAGNOSIS — I480 Paroxysmal atrial fibrillation: Secondary | ICD-10-CM

## 2023-07-17 DIAGNOSIS — Z4901 Encounter for fitting and adjustment of extracorporeal dialysis catheter: Secondary | ICD-10-CM | POA: Diagnosis not present

## 2023-07-17 DIAGNOSIS — N2581 Secondary hyperparathyroidism of renal origin: Secondary | ICD-10-CM | POA: Diagnosis not present

## 2023-07-18 DIAGNOSIS — I5020 Unspecified systolic (congestive) heart failure: Secondary | ICD-10-CM

## 2023-07-18 DIAGNOSIS — I2510 Atherosclerotic heart disease of native coronary artery without angina pectoris: Secondary | ICD-10-CM | POA: Diagnosis not present

## 2023-07-18 DIAGNOSIS — I480 Paroxysmal atrial fibrillation: Secondary | ICD-10-CM | POA: Diagnosis not present

## 2023-07-19 DIAGNOSIS — D689 Coagulation defect, unspecified: Secondary | ICD-10-CM | POA: Diagnosis not present

## 2023-07-19 DIAGNOSIS — Z992 Dependence on renal dialysis: Secondary | ICD-10-CM | POA: Diagnosis not present

## 2023-07-19 DIAGNOSIS — Z4901 Encounter for fitting and adjustment of extracorporeal dialysis catheter: Secondary | ICD-10-CM | POA: Diagnosis not present

## 2023-07-19 DIAGNOSIS — D509 Iron deficiency anemia, unspecified: Secondary | ICD-10-CM | POA: Diagnosis not present

## 2023-07-19 DIAGNOSIS — N186 End stage renal disease: Secondary | ICD-10-CM | POA: Diagnosis not present

## 2023-07-19 DIAGNOSIS — N2581 Secondary hyperparathyroidism of renal origin: Secondary | ICD-10-CM | POA: Diagnosis not present

## 2023-07-20 ENCOUNTER — Telehealth: Payer: Self-pay

## 2023-07-20 NOTE — Telephone Encounter (Signed)
 Spoke with the patient to give her monitor results and she states her Dr Ronalee Belts, her kidney doctor, has started her on Norvasc 5mg  Take 1 tablet once a day. She states she just wants to make [place] aware.  Amiodarone has been removed off med list.

## 2023-07-21 DIAGNOSIS — N2581 Secondary hyperparathyroidism of renal origin: Secondary | ICD-10-CM | POA: Diagnosis not present

## 2023-07-21 DIAGNOSIS — Z4901 Encounter for fitting and adjustment of extracorporeal dialysis catheter: Secondary | ICD-10-CM | POA: Diagnosis not present

## 2023-07-21 DIAGNOSIS — N186 End stage renal disease: Secondary | ICD-10-CM | POA: Diagnosis not present

## 2023-07-21 DIAGNOSIS — D509 Iron deficiency anemia, unspecified: Secondary | ICD-10-CM | POA: Diagnosis not present

## 2023-07-21 DIAGNOSIS — Z992 Dependence on renal dialysis: Secondary | ICD-10-CM | POA: Diagnosis not present

## 2023-07-21 DIAGNOSIS — D689 Coagulation defect, unspecified: Secondary | ICD-10-CM | POA: Diagnosis not present

## 2023-07-24 DIAGNOSIS — N186 End stage renal disease: Secondary | ICD-10-CM | POA: Diagnosis not present

## 2023-07-24 DIAGNOSIS — Z992 Dependence on renal dialysis: Secondary | ICD-10-CM | POA: Diagnosis not present

## 2023-07-24 DIAGNOSIS — D509 Iron deficiency anemia, unspecified: Secondary | ICD-10-CM | POA: Diagnosis not present

## 2023-07-24 DIAGNOSIS — N2581 Secondary hyperparathyroidism of renal origin: Secondary | ICD-10-CM | POA: Diagnosis not present

## 2023-07-24 DIAGNOSIS — D689 Coagulation defect, unspecified: Secondary | ICD-10-CM | POA: Diagnosis not present

## 2023-07-24 DIAGNOSIS — Z4901 Encounter for fitting and adjustment of extracorporeal dialysis catheter: Secondary | ICD-10-CM | POA: Diagnosis not present

## 2023-07-26 DIAGNOSIS — D509 Iron deficiency anemia, unspecified: Secondary | ICD-10-CM | POA: Diagnosis not present

## 2023-07-26 DIAGNOSIS — Z4901 Encounter for fitting and adjustment of extracorporeal dialysis catheter: Secondary | ICD-10-CM | POA: Diagnosis not present

## 2023-07-26 DIAGNOSIS — N186 End stage renal disease: Secondary | ICD-10-CM | POA: Diagnosis not present

## 2023-07-26 DIAGNOSIS — N2581 Secondary hyperparathyroidism of renal origin: Secondary | ICD-10-CM | POA: Diagnosis not present

## 2023-07-26 DIAGNOSIS — D689 Coagulation defect, unspecified: Secondary | ICD-10-CM | POA: Diagnosis not present

## 2023-07-26 DIAGNOSIS — Z992 Dependence on renal dialysis: Secondary | ICD-10-CM | POA: Diagnosis not present

## 2023-07-27 ENCOUNTER — Other Ambulatory Visit (HOSPITAL_COMMUNITY): Payer: Self-pay

## 2023-07-27 DIAGNOSIS — N186 End stage renal disease: Secondary | ICD-10-CM | POA: Diagnosis not present

## 2023-07-27 DIAGNOSIS — D689 Coagulation defect, unspecified: Secondary | ICD-10-CM | POA: Diagnosis not present

## 2023-07-27 DIAGNOSIS — Z4901 Encounter for fitting and adjustment of extracorporeal dialysis catheter: Secondary | ICD-10-CM | POA: Diagnosis not present

## 2023-07-27 DIAGNOSIS — N2581 Secondary hyperparathyroidism of renal origin: Secondary | ICD-10-CM | POA: Diagnosis not present

## 2023-07-27 DIAGNOSIS — Z992 Dependence on renal dialysis: Secondary | ICD-10-CM | POA: Diagnosis not present

## 2023-07-27 DIAGNOSIS — D509 Iron deficiency anemia, unspecified: Secondary | ICD-10-CM | POA: Diagnosis not present

## 2023-07-28 DIAGNOSIS — N186 End stage renal disease: Secondary | ICD-10-CM | POA: Diagnosis not present

## 2023-07-28 DIAGNOSIS — Z992 Dependence on renal dialysis: Secondary | ICD-10-CM | POA: Diagnosis not present

## 2023-07-28 DIAGNOSIS — Z4901 Encounter for fitting and adjustment of extracorporeal dialysis catheter: Secondary | ICD-10-CM | POA: Diagnosis not present

## 2023-07-28 DIAGNOSIS — N2581 Secondary hyperparathyroidism of renal origin: Secondary | ICD-10-CM | POA: Diagnosis not present

## 2023-07-28 DIAGNOSIS — D509 Iron deficiency anemia, unspecified: Secondary | ICD-10-CM | POA: Diagnosis not present

## 2023-07-28 DIAGNOSIS — D689 Coagulation defect, unspecified: Secondary | ICD-10-CM | POA: Diagnosis not present

## 2023-07-31 DIAGNOSIS — Z992 Dependence on renal dialysis: Secondary | ICD-10-CM | POA: Diagnosis not present

## 2023-07-31 DIAGNOSIS — Z4901 Encounter for fitting and adjustment of extracorporeal dialysis catheter: Secondary | ICD-10-CM | POA: Diagnosis not present

## 2023-07-31 DIAGNOSIS — D689 Coagulation defect, unspecified: Secondary | ICD-10-CM | POA: Diagnosis not present

## 2023-07-31 DIAGNOSIS — N2581 Secondary hyperparathyroidism of renal origin: Secondary | ICD-10-CM | POA: Diagnosis not present

## 2023-07-31 DIAGNOSIS — N186 End stage renal disease: Secondary | ICD-10-CM | POA: Diagnosis not present

## 2023-07-31 DIAGNOSIS — D509 Iron deficiency anemia, unspecified: Secondary | ICD-10-CM | POA: Diagnosis not present

## 2023-08-02 DIAGNOSIS — N2581 Secondary hyperparathyroidism of renal origin: Secondary | ICD-10-CM | POA: Diagnosis not present

## 2023-08-02 DIAGNOSIS — D689 Coagulation defect, unspecified: Secondary | ICD-10-CM | POA: Diagnosis not present

## 2023-08-02 DIAGNOSIS — Z4901 Encounter for fitting and adjustment of extracorporeal dialysis catheter: Secondary | ICD-10-CM | POA: Diagnosis not present

## 2023-08-02 DIAGNOSIS — N186 End stage renal disease: Secondary | ICD-10-CM | POA: Diagnosis not present

## 2023-08-02 DIAGNOSIS — D509 Iron deficiency anemia, unspecified: Secondary | ICD-10-CM | POA: Diagnosis not present

## 2023-08-02 DIAGNOSIS — Z992 Dependence on renal dialysis: Secondary | ICD-10-CM | POA: Diagnosis not present

## 2023-08-04 DIAGNOSIS — Z992 Dependence on renal dialysis: Secondary | ICD-10-CM | POA: Diagnosis not present

## 2023-08-04 DIAGNOSIS — Z4901 Encounter for fitting and adjustment of extracorporeal dialysis catheter: Secondary | ICD-10-CM | POA: Diagnosis not present

## 2023-08-04 DIAGNOSIS — D689 Coagulation defect, unspecified: Secondary | ICD-10-CM | POA: Diagnosis not present

## 2023-08-04 DIAGNOSIS — N2581 Secondary hyperparathyroidism of renal origin: Secondary | ICD-10-CM | POA: Diagnosis not present

## 2023-08-04 DIAGNOSIS — N186 End stage renal disease: Secondary | ICD-10-CM | POA: Diagnosis not present

## 2023-08-04 DIAGNOSIS — D509 Iron deficiency anemia, unspecified: Secondary | ICD-10-CM | POA: Diagnosis not present

## 2023-08-06 DIAGNOSIS — Z992 Dependence on renal dialysis: Secondary | ICD-10-CM | POA: Diagnosis not present

## 2023-08-06 DIAGNOSIS — N186 End stage renal disease: Secondary | ICD-10-CM | POA: Diagnosis not present

## 2023-08-07 DIAGNOSIS — N2581 Secondary hyperparathyroidism of renal origin: Secondary | ICD-10-CM | POA: Diagnosis not present

## 2023-08-07 DIAGNOSIS — Z992 Dependence on renal dialysis: Secondary | ICD-10-CM | POA: Diagnosis not present

## 2023-08-07 DIAGNOSIS — N186 End stage renal disease: Secondary | ICD-10-CM | POA: Diagnosis not present

## 2023-08-07 DIAGNOSIS — Z4901 Encounter for fitting and adjustment of extracorporeal dialysis catheter: Secondary | ICD-10-CM | POA: Diagnosis not present

## 2023-08-07 DIAGNOSIS — D689 Coagulation defect, unspecified: Secondary | ICD-10-CM | POA: Diagnosis not present

## 2023-08-07 DIAGNOSIS — D509 Iron deficiency anemia, unspecified: Secondary | ICD-10-CM | POA: Diagnosis not present

## 2023-08-08 DIAGNOSIS — I120 Hypertensive chronic kidney disease with stage 5 chronic kidney disease or end stage renal disease: Secondary | ICD-10-CM | POA: Diagnosis not present

## 2023-08-08 DIAGNOSIS — Z992 Dependence on renal dialysis: Secondary | ICD-10-CM | POA: Diagnosis not present

## 2023-08-08 DIAGNOSIS — N186 End stage renal disease: Secondary | ICD-10-CM | POA: Diagnosis not present

## 2023-08-09 DIAGNOSIS — N186 End stage renal disease: Secondary | ICD-10-CM | POA: Diagnosis not present

## 2023-08-09 DIAGNOSIS — D631 Anemia in chronic kidney disease: Secondary | ICD-10-CM | POA: Diagnosis not present

## 2023-08-09 DIAGNOSIS — Z992 Dependence on renal dialysis: Secondary | ICD-10-CM | POA: Diagnosis not present

## 2023-08-09 DIAGNOSIS — N2581 Secondary hyperparathyroidism of renal origin: Secondary | ICD-10-CM | POA: Diagnosis not present

## 2023-08-09 DIAGNOSIS — D689 Coagulation defect, unspecified: Secondary | ICD-10-CM | POA: Diagnosis not present

## 2023-08-09 DIAGNOSIS — D509 Iron deficiency anemia, unspecified: Secondary | ICD-10-CM | POA: Diagnosis not present

## 2023-08-11 DIAGNOSIS — D689 Coagulation defect, unspecified: Secondary | ICD-10-CM | POA: Diagnosis not present

## 2023-08-11 DIAGNOSIS — D509 Iron deficiency anemia, unspecified: Secondary | ICD-10-CM | POA: Diagnosis not present

## 2023-08-11 DIAGNOSIS — N2581 Secondary hyperparathyroidism of renal origin: Secondary | ICD-10-CM | POA: Diagnosis not present

## 2023-08-11 DIAGNOSIS — D631 Anemia in chronic kidney disease: Secondary | ICD-10-CM | POA: Diagnosis not present

## 2023-08-11 DIAGNOSIS — N186 End stage renal disease: Secondary | ICD-10-CM | POA: Diagnosis not present

## 2023-08-11 DIAGNOSIS — Z992 Dependence on renal dialysis: Secondary | ICD-10-CM | POA: Diagnosis not present

## 2023-08-14 DIAGNOSIS — N2581 Secondary hyperparathyroidism of renal origin: Secondary | ICD-10-CM | POA: Diagnosis not present

## 2023-08-14 DIAGNOSIS — Z992 Dependence on renal dialysis: Secondary | ICD-10-CM | POA: Diagnosis not present

## 2023-08-14 DIAGNOSIS — D631 Anemia in chronic kidney disease: Secondary | ICD-10-CM | POA: Diagnosis not present

## 2023-08-14 DIAGNOSIS — N186 End stage renal disease: Secondary | ICD-10-CM | POA: Diagnosis not present

## 2023-08-14 DIAGNOSIS — D509 Iron deficiency anemia, unspecified: Secondary | ICD-10-CM | POA: Diagnosis not present

## 2023-08-14 DIAGNOSIS — D689 Coagulation defect, unspecified: Secondary | ICD-10-CM | POA: Diagnosis not present

## 2023-08-16 DIAGNOSIS — D631 Anemia in chronic kidney disease: Secondary | ICD-10-CM | POA: Diagnosis not present

## 2023-08-16 DIAGNOSIS — D689 Coagulation defect, unspecified: Secondary | ICD-10-CM | POA: Diagnosis not present

## 2023-08-16 DIAGNOSIS — N186 End stage renal disease: Secondary | ICD-10-CM | POA: Diagnosis not present

## 2023-08-16 DIAGNOSIS — N2581 Secondary hyperparathyroidism of renal origin: Secondary | ICD-10-CM | POA: Diagnosis not present

## 2023-08-16 DIAGNOSIS — D509 Iron deficiency anemia, unspecified: Secondary | ICD-10-CM | POA: Diagnosis not present

## 2023-08-16 DIAGNOSIS — Z992 Dependence on renal dialysis: Secondary | ICD-10-CM | POA: Diagnosis not present

## 2023-08-18 DIAGNOSIS — D631 Anemia in chronic kidney disease: Secondary | ICD-10-CM | POA: Diagnosis not present

## 2023-08-18 DIAGNOSIS — N186 End stage renal disease: Secondary | ICD-10-CM | POA: Diagnosis not present

## 2023-08-18 DIAGNOSIS — Z992 Dependence on renal dialysis: Secondary | ICD-10-CM | POA: Diagnosis not present

## 2023-08-18 DIAGNOSIS — D689 Coagulation defect, unspecified: Secondary | ICD-10-CM | POA: Diagnosis not present

## 2023-08-18 DIAGNOSIS — D509 Iron deficiency anemia, unspecified: Secondary | ICD-10-CM | POA: Diagnosis not present

## 2023-08-18 DIAGNOSIS — N2581 Secondary hyperparathyroidism of renal origin: Secondary | ICD-10-CM | POA: Diagnosis not present

## 2023-08-21 DIAGNOSIS — N186 End stage renal disease: Secondary | ICD-10-CM | POA: Diagnosis not present

## 2023-08-21 DIAGNOSIS — D689 Coagulation defect, unspecified: Secondary | ICD-10-CM | POA: Diagnosis not present

## 2023-08-21 DIAGNOSIS — D631 Anemia in chronic kidney disease: Secondary | ICD-10-CM | POA: Diagnosis not present

## 2023-08-21 DIAGNOSIS — N2581 Secondary hyperparathyroidism of renal origin: Secondary | ICD-10-CM | POA: Diagnosis not present

## 2023-08-21 DIAGNOSIS — Z992 Dependence on renal dialysis: Secondary | ICD-10-CM | POA: Diagnosis not present

## 2023-08-21 DIAGNOSIS — D509 Iron deficiency anemia, unspecified: Secondary | ICD-10-CM | POA: Diagnosis not present

## 2023-08-22 DIAGNOSIS — D631 Anemia in chronic kidney disease: Secondary | ICD-10-CM | POA: Diagnosis not present

## 2023-08-22 DIAGNOSIS — N2581 Secondary hyperparathyroidism of renal origin: Secondary | ICD-10-CM | POA: Diagnosis not present

## 2023-08-22 DIAGNOSIS — N186 End stage renal disease: Secondary | ICD-10-CM | POA: Diagnosis not present

## 2023-08-22 DIAGNOSIS — Z992 Dependence on renal dialysis: Secondary | ICD-10-CM | POA: Diagnosis not present

## 2023-08-22 DIAGNOSIS — D509 Iron deficiency anemia, unspecified: Secondary | ICD-10-CM | POA: Diagnosis not present

## 2023-08-22 DIAGNOSIS — D689 Coagulation defect, unspecified: Secondary | ICD-10-CM | POA: Diagnosis not present

## 2023-08-23 DIAGNOSIS — D689 Coagulation defect, unspecified: Secondary | ICD-10-CM | POA: Diagnosis not present

## 2023-08-23 DIAGNOSIS — Z992 Dependence on renal dialysis: Secondary | ICD-10-CM | POA: Diagnosis not present

## 2023-08-23 DIAGNOSIS — D509 Iron deficiency anemia, unspecified: Secondary | ICD-10-CM | POA: Diagnosis not present

## 2023-08-23 DIAGNOSIS — D631 Anemia in chronic kidney disease: Secondary | ICD-10-CM | POA: Diagnosis not present

## 2023-08-23 DIAGNOSIS — N2581 Secondary hyperparathyroidism of renal origin: Secondary | ICD-10-CM | POA: Diagnosis not present

## 2023-08-23 DIAGNOSIS — N186 End stage renal disease: Secondary | ICD-10-CM | POA: Diagnosis not present

## 2023-08-25 DIAGNOSIS — Z992 Dependence on renal dialysis: Secondary | ICD-10-CM | POA: Diagnosis not present

## 2023-08-25 DIAGNOSIS — D631 Anemia in chronic kidney disease: Secondary | ICD-10-CM | POA: Diagnosis not present

## 2023-08-25 DIAGNOSIS — D509 Iron deficiency anemia, unspecified: Secondary | ICD-10-CM | POA: Diagnosis not present

## 2023-08-25 DIAGNOSIS — D689 Coagulation defect, unspecified: Secondary | ICD-10-CM | POA: Diagnosis not present

## 2023-08-25 DIAGNOSIS — N186 End stage renal disease: Secondary | ICD-10-CM | POA: Diagnosis not present

## 2023-08-25 DIAGNOSIS — N2581 Secondary hyperparathyroidism of renal origin: Secondary | ICD-10-CM | POA: Diagnosis not present

## 2023-08-28 DIAGNOSIS — D509 Iron deficiency anemia, unspecified: Secondary | ICD-10-CM | POA: Diagnosis not present

## 2023-08-28 DIAGNOSIS — D689 Coagulation defect, unspecified: Secondary | ICD-10-CM | POA: Diagnosis not present

## 2023-08-28 DIAGNOSIS — D631 Anemia in chronic kidney disease: Secondary | ICD-10-CM | POA: Diagnosis not present

## 2023-08-28 DIAGNOSIS — N186 End stage renal disease: Secondary | ICD-10-CM | POA: Diagnosis not present

## 2023-08-28 DIAGNOSIS — N2581 Secondary hyperparathyroidism of renal origin: Secondary | ICD-10-CM | POA: Diagnosis not present

## 2023-08-28 DIAGNOSIS — Z992 Dependence on renal dialysis: Secondary | ICD-10-CM | POA: Diagnosis not present

## 2023-08-30 DIAGNOSIS — D689 Coagulation defect, unspecified: Secondary | ICD-10-CM | POA: Diagnosis not present

## 2023-08-30 DIAGNOSIS — N186 End stage renal disease: Secondary | ICD-10-CM | POA: Diagnosis not present

## 2023-08-30 DIAGNOSIS — N2581 Secondary hyperparathyroidism of renal origin: Secondary | ICD-10-CM | POA: Diagnosis not present

## 2023-08-30 DIAGNOSIS — Z992 Dependence on renal dialysis: Secondary | ICD-10-CM | POA: Diagnosis not present

## 2023-08-30 DIAGNOSIS — D509 Iron deficiency anemia, unspecified: Secondary | ICD-10-CM | POA: Diagnosis not present

## 2023-08-30 DIAGNOSIS — D631 Anemia in chronic kidney disease: Secondary | ICD-10-CM | POA: Diagnosis not present

## 2023-09-01 DIAGNOSIS — Z992 Dependence on renal dialysis: Secondary | ICD-10-CM | POA: Diagnosis not present

## 2023-09-01 DIAGNOSIS — D631 Anemia in chronic kidney disease: Secondary | ICD-10-CM | POA: Diagnosis not present

## 2023-09-01 DIAGNOSIS — D509 Iron deficiency anemia, unspecified: Secondary | ICD-10-CM | POA: Diagnosis not present

## 2023-09-01 DIAGNOSIS — N2581 Secondary hyperparathyroidism of renal origin: Secondary | ICD-10-CM | POA: Diagnosis not present

## 2023-09-01 DIAGNOSIS — N186 End stage renal disease: Secondary | ICD-10-CM | POA: Diagnosis not present

## 2023-09-01 DIAGNOSIS — D689 Coagulation defect, unspecified: Secondary | ICD-10-CM | POA: Diagnosis not present

## 2023-09-03 DIAGNOSIS — N186 End stage renal disease: Secondary | ICD-10-CM | POA: Diagnosis not present

## 2023-09-03 DIAGNOSIS — D509 Iron deficiency anemia, unspecified: Secondary | ICD-10-CM | POA: Diagnosis not present

## 2023-09-03 DIAGNOSIS — N2581 Secondary hyperparathyroidism of renal origin: Secondary | ICD-10-CM | POA: Diagnosis not present

## 2023-09-03 DIAGNOSIS — Z992 Dependence on renal dialysis: Secondary | ICD-10-CM | POA: Diagnosis not present

## 2023-09-03 DIAGNOSIS — D689 Coagulation defect, unspecified: Secondary | ICD-10-CM | POA: Diagnosis not present

## 2023-09-03 DIAGNOSIS — D631 Anemia in chronic kidney disease: Secondary | ICD-10-CM | POA: Diagnosis not present

## 2023-09-04 DIAGNOSIS — N2581 Secondary hyperparathyroidism of renal origin: Secondary | ICD-10-CM | POA: Diagnosis not present

## 2023-09-04 DIAGNOSIS — N186 End stage renal disease: Secondary | ICD-10-CM | POA: Diagnosis not present

## 2023-09-04 DIAGNOSIS — D509 Iron deficiency anemia, unspecified: Secondary | ICD-10-CM | POA: Diagnosis not present

## 2023-09-04 DIAGNOSIS — Z992 Dependence on renal dialysis: Secondary | ICD-10-CM | POA: Diagnosis not present

## 2023-09-04 DIAGNOSIS — D689 Coagulation defect, unspecified: Secondary | ICD-10-CM | POA: Diagnosis not present

## 2023-09-04 DIAGNOSIS — D631 Anemia in chronic kidney disease: Secondary | ICD-10-CM | POA: Diagnosis not present

## 2023-09-05 NOTE — Progress Notes (Unsigned)
 Cardiology Office Note    Patient Name: Rachel Villarreal Date of Encounter: 09/06/2023  Primary Care Provider:  Jearldine Mina, MD Primary Cardiologist:  Jann Melody, MD Primary Electrophysiologist: None   Past Medical History    Past Medical History:  Diagnosis Date   Abnormal Pap smear of vagina    Anemia    Asthma    Mild   Cancer (HCC)    left breast ILC   Chronic kidney disease (CKD)    stage 4   GERD (gastroesophageal reflux disease)    Gout 05/2012   Hernia, incisional    History of abnormal Pap smear    CIN III, 2001   History of blood transfusion    POST OP   History of uterine fibroid    Hypertension    Peritonitis (HCC)    Respiratory failure (HCC)    after ruptured appendix   Ruptured appendix    Seasonal allergies    Shingles 02/2013   very mild case   Urine incontinence     History of Present Illness  Rachel Villarreal is a 78 y.o. female with a PMH of CAD s/p NSTEMI with LHC showing single-vessel disease in mid circumflex at 85% treated medically, HFrEF, paroxysmal AF, essential HTN, HLD, breast CA, GERD, asthma ESRD (Monday, Wednesday, Friday) who presents today for follow-up.   Rachel Villarreal was last seen on 06/08/2023 for 1 month follow-up.  During visit she reported no episodes of tachycardia and amiodarone  was discontinued.  She wore a 30-day event monitor that showed no episodes of AF or flutter.  She was started on Norvasc  5 mg by Dr. Edson Graces her kidney doctor for management of BP.  Rachel Villarreal presents today for 68-month follow-up. Previously on amiodarone , which was discontinued, she wore an event monitor for a month that showed no evidence of atrial fibrillation. No symptoms of atrial fibrillation such as palpitations or tachycardia have been experienced.  She expressed concerns about the risk of bleeding while on Eliquis  are noted, with bruising at her dialysis graft site due to infiltration. No spontaneous bleeding episodes have occurred.  Her CHADS-VASc score is 6. Episodes of lightheadedness and dizziness occur, particularly during dialysis when her blood pressure drops significantly. Blood pressure before dialysis is typically around 150/75 mmHg, but it can drop to 100 mmHg during the procedure, leading to fainting. She takes metoprolol  100 mg daily after supper and recently discontinued amlodipine  due to increased episodes of hypotension during dialysis. A history of heart failure with reduced ejection fraction is noted, with a previous echocardiogram showing heart function of 35-40%. She reports feeling much better than before, with improved lung function and less fatigue. Regular exercise has not been resumed. Weight gain since her last visit is attributed to improved appetite and feeling better overall. Previously borderline malnourished during a hospital stay, she has since gained weight intentionally. Patient denies chest pain, palpitations, dyspnea, PND, orthopnea, nausea, vomiting, dizziness, syncope, edema, weight gain, or early satiety.  Discussed the use of AI scribe software for clinical note transcription with the patient, who gave verbal consent to proceed.  History of Present Illness   Review of Systems  Please see the history of present illness.    All other systems reviewed and are otherwise negative except as noted above.  Physical Exam     Wt Readings from Last 3 Encounters:  09/06/23 155 lb 9.6 oz (70.6 kg)  05/21/23 142 lb 1.6 oz (64.5 kg)  05/10/23 142 lb 9.6  oz (64.7 kg)   VS: Vitals:   09/06/23 1113  BP: 132/70  Pulse: 76  SpO2: 97%  ,Body mass index is 28.46 kg/m. GEN: Well nourished, well developed in no acute distress Neck: No JVD; No carotid bruits Pulmonary: Clear to auscultation without rales, wheezing or rhonchi  Cardiovascular: Normal rate. Regular rhythm. Normal S1. Normal S2.   Murmurs: There is no murmur.  ABDOMEN: Soft, non-tender, non-distended EXTREMITIES:  No edema; No  deformity   EKG/LABS/ Recent Cardiac Studies   ECG personally reviewed by me today -none completed today  Risk Assessment/Calculations:    CHA2DS2-VASc Score = 6   This indicates a 9.7% annual risk of stroke. The patient's score is based upon: CHF History: 1 HTN History: 1 Diabetes History: 0 Stroke History: 0 Vascular Disease History: 1 Age Score: 2 Gender Score: 1         Lab Results  Component Value Date   WBC 14.1 (H) 04/25/2023   HGB 11.1 (L) 04/25/2023   HCT 32.5 (L) 04/25/2023   MCV 92.9 04/25/2023   PLT 282 04/25/2023   Lab Results  Component Value Date   CREATININE 6.10 (H) 05/10/2023   BUN 40 (H) 05/10/2023   NA 141 05/10/2023   K 3.8 05/10/2023   CL 97 05/10/2023   CO2 24 05/10/2023   Lab Results  Component Value Date   CHOL 137 04/17/2023   HDL 36 (L) 04/17/2023   LDLCALC 79 04/17/2023   TRIG 108 04/17/2023   CHOLHDL 3.8 04/17/2023    Lab Results  Component Value Date   HGBA1C 5.2 04/17/2023   Assessment & Plan    Assessment & Plan   1.  Essential hypertension: - Patient's blood pressure today was stable at 132/70 - Continue Toprol -XL 100 mg daily  2.Paroxysmal AF:  -No AFib on event monitor. CHADS-VASc score 6 indicates high stroke risk. Discussed Eliquis  risks and benefits. - Continue Eliquis  5 mg twice daily for stroke prevention. - Consider discontinuing Eliquis  if no AFib recurrence over a year. - Order repeat echocardiogram to assess heart function.  3.HFrEF:  -EF of 35 as 40% with mild concentric LVH mildly reduced RV function with moderately enlarged RV, and pleural effusions.  - Patient is euvolemic on exam today and reports less atigue and better lung function over the past few months. - Order repeat echocardiogram to assess heart function. - Encourage gradual increase in physical activity based on echocardiogram results.  4.ESRD:  -Currently dialyzed Tuesday, Thursday, Friday -Experiences hypotension during dialysis.  Amlodipine  discontinued due to hypotension. Discussed home blood pressure monitoring. - Avoid reintroducing amlodipine  unless home blood pressures increase. - Consider using home blood pressure cuff to verify dialysis center readings.  5. Coronary artery disease: -Recent heart catheterization showed blockage in the pLAD to mid LAD 85% stenosed mid circumflex lesion treated medically - Patient reports no chest pain or angina since previous follow-up. - Continue current GDMT with Toprol -XL 100 mg and Lipitor  80 mg daily    Disposition: Follow-up with Jann Melody, MD or APP in 6 months    Signed, Francene Ing, Retha Cast, NP 09/06/2023, 12:27 PM [facility]

## 2023-09-06 ENCOUNTER — Ambulatory Visit: Payer: Medicare Other | Attending: Nurse Practitioner | Admitting: Nurse Practitioner

## 2023-09-06 ENCOUNTER — Encounter: Payer: Self-pay | Admitting: Nurse Practitioner

## 2023-09-06 VITALS — BP 132/70 | HR 76 | Ht 62.0 in | Wt 155.6 lb

## 2023-09-06 DIAGNOSIS — N186 End stage renal disease: Secondary | ICD-10-CM | POA: Diagnosis not present

## 2023-09-06 DIAGNOSIS — I10 Essential (primary) hypertension: Secondary | ICD-10-CM

## 2023-09-06 DIAGNOSIS — I2510 Atherosclerotic heart disease of native coronary artery without angina pectoris: Secondary | ICD-10-CM

## 2023-09-06 DIAGNOSIS — I5020 Unspecified systolic (congestive) heart failure: Secondary | ICD-10-CM | POA: Diagnosis not present

## 2023-09-06 DIAGNOSIS — I480 Paroxysmal atrial fibrillation: Secondary | ICD-10-CM

## 2023-09-06 NOTE — Patient Instructions (Addendum)
 Medication Instructions:  Your physician recommends that you continue on your current medications as directed. Please refer to the Current Medication list given to you today. *If you need a refill on your cardiac medications before your next appointment, please call your pharmacy*  Lab Work: None ordered If you have labs (blood work) drawn today and your tests are completely normal, you will receive your results only by: MyChart Message (if you have MyChart) OR A paper copy in the mail If you have any lab test that is abnormal or we need to change your treatment, we will call you to review the results.  Testing/Procedures: Your physician has requested that you have an echocardiogram. Echocardiography is a painless test that uses sound waves to create images of your heart. It provides your doctor with information about the size and shape of your heart and how well your heart's chambers and valves are working. This procedure takes approximately one hour. There are no restrictions for this procedure. Please do NOT wear cologne, perfume, aftershave, or lotions (deodorant is allowed). Please arrive 15 minutes prior to your appointment time.  Please note: We ask at that you not bring children with you during ultrasound (echo/ vascular) testing. Due to room size and safety concerns, children are not allowed in the ultrasound rooms during exams. Our front office staff cannot provide observation of children in our lobby area while testing is being conducted. An adult accompanying a patient to their appointment will only be allowed in the ultrasound room at the discretion of the ultrasound technician under special circumstances. We apologize for any inconvenience.  Follow-Up: At [facility], you and your health needs are our priority.  As part of our continuing mission to provide you with exceptional heart care, our providers are all part of one team.  This team includes your primary Cardiologist  (physician) and Advanced Practice Providers or APPs (Physician Assistants and Nurse Practitioners) who all work together to provide you with the care you need, when you need it.  Your next appointment:   6 month(s)  Provider:   Jann Melody, MD    We recommend signing up for the patient portal called "MyChart".  Sign up information is provided on this After Visit Summary.  MyChart is used to connect with patients for Virtual Visits (Telemedicine).  Patients are able to view lab/test results, encounter notes, upcoming appointments, etc.  Non-urgent messages can be sent to your provider as well.   To learn more about what you can do with MyChart, go to [email or web address].   Other Instructions

## 2023-09-07 DIAGNOSIS — D509 Iron deficiency anemia, unspecified: Secondary | ICD-10-CM | POA: Diagnosis not present

## 2023-09-07 DIAGNOSIS — N186 End stage renal disease: Secondary | ICD-10-CM | POA: Diagnosis not present

## 2023-09-07 DIAGNOSIS — Z992 Dependence on renal dialysis: Secondary | ICD-10-CM | POA: Diagnosis not present

## 2023-09-07 DIAGNOSIS — D631 Anemia in chronic kidney disease: Secondary | ICD-10-CM | POA: Diagnosis not present

## 2023-09-07 DIAGNOSIS — N2581 Secondary hyperparathyroidism of renal origin: Secondary | ICD-10-CM | POA: Diagnosis not present

## 2023-09-07 DIAGNOSIS — D689 Coagulation defect, unspecified: Secondary | ICD-10-CM | POA: Diagnosis not present

## 2023-09-08 DIAGNOSIS — N2581 Secondary hyperparathyroidism of renal origin: Secondary | ICD-10-CM | POA: Diagnosis not present

## 2023-09-08 DIAGNOSIS — D509 Iron deficiency anemia, unspecified: Secondary | ICD-10-CM | POA: Diagnosis not present

## 2023-09-08 DIAGNOSIS — I120 Hypertensive chronic kidney disease with stage 5 chronic kidney disease or end stage renal disease: Secondary | ICD-10-CM | POA: Diagnosis not present

## 2023-09-08 DIAGNOSIS — N186 End stage renal disease: Secondary | ICD-10-CM | POA: Diagnosis not present

## 2023-09-08 DIAGNOSIS — Z992 Dependence on renal dialysis: Secondary | ICD-10-CM | POA: Diagnosis not present

## 2023-09-08 DIAGNOSIS — D689 Coagulation defect, unspecified: Secondary | ICD-10-CM | POA: Diagnosis not present

## 2023-09-08 DIAGNOSIS — D631 Anemia in chronic kidney disease: Secondary | ICD-10-CM | POA: Diagnosis not present

## 2023-09-11 DIAGNOSIS — D689 Coagulation defect, unspecified: Secondary | ICD-10-CM | POA: Diagnosis not present

## 2023-09-11 DIAGNOSIS — D631 Anemia in chronic kidney disease: Secondary | ICD-10-CM | POA: Diagnosis not present

## 2023-09-11 DIAGNOSIS — N2581 Secondary hyperparathyroidism of renal origin: Secondary | ICD-10-CM | POA: Diagnosis not present

## 2023-09-11 DIAGNOSIS — Z992 Dependence on renal dialysis: Secondary | ICD-10-CM | POA: Diagnosis not present

## 2023-09-11 DIAGNOSIS — N186 End stage renal disease: Secondary | ICD-10-CM | POA: Diagnosis not present

## 2023-09-13 DIAGNOSIS — N2581 Secondary hyperparathyroidism of renal origin: Secondary | ICD-10-CM | POA: Diagnosis not present

## 2023-09-13 DIAGNOSIS — Z992 Dependence on renal dialysis: Secondary | ICD-10-CM | POA: Diagnosis not present

## 2023-09-13 DIAGNOSIS — N186 End stage renal disease: Secondary | ICD-10-CM | POA: Diagnosis not present

## 2023-09-13 DIAGNOSIS — D631 Anemia in chronic kidney disease: Secondary | ICD-10-CM | POA: Diagnosis not present

## 2023-09-13 DIAGNOSIS — D689 Coagulation defect, unspecified: Secondary | ICD-10-CM | POA: Diagnosis not present

## 2023-09-15 DIAGNOSIS — N2581 Secondary hyperparathyroidism of renal origin: Secondary | ICD-10-CM | POA: Diagnosis not present

## 2023-09-15 DIAGNOSIS — D631 Anemia in chronic kidney disease: Secondary | ICD-10-CM | POA: Diagnosis not present

## 2023-09-15 DIAGNOSIS — N186 End stage renal disease: Secondary | ICD-10-CM | POA: Diagnosis not present

## 2023-09-15 DIAGNOSIS — Z992 Dependence on renal dialysis: Secondary | ICD-10-CM | POA: Diagnosis not present

## 2023-09-15 DIAGNOSIS — D689 Coagulation defect, unspecified: Secondary | ICD-10-CM | POA: Diagnosis not present

## 2023-09-18 DIAGNOSIS — N2581 Secondary hyperparathyroidism of renal origin: Secondary | ICD-10-CM | POA: Diagnosis not present

## 2023-09-18 DIAGNOSIS — N186 End stage renal disease: Secondary | ICD-10-CM | POA: Diagnosis not present

## 2023-09-18 DIAGNOSIS — D689 Coagulation defect, unspecified: Secondary | ICD-10-CM | POA: Diagnosis not present

## 2023-09-18 DIAGNOSIS — D631 Anemia in chronic kidney disease: Secondary | ICD-10-CM | POA: Diagnosis not present

## 2023-09-18 DIAGNOSIS — Z992 Dependence on renal dialysis: Secondary | ICD-10-CM | POA: Diagnosis not present

## 2023-09-20 DIAGNOSIS — Z992 Dependence on renal dialysis: Secondary | ICD-10-CM | POA: Diagnosis not present

## 2023-09-20 DIAGNOSIS — D631 Anemia in chronic kidney disease: Secondary | ICD-10-CM | POA: Diagnosis not present

## 2023-09-20 DIAGNOSIS — N186 End stage renal disease: Secondary | ICD-10-CM | POA: Diagnosis not present

## 2023-09-20 DIAGNOSIS — D689 Coagulation defect, unspecified: Secondary | ICD-10-CM | POA: Diagnosis not present

## 2023-09-20 DIAGNOSIS — N2581 Secondary hyperparathyroidism of renal origin: Secondary | ICD-10-CM | POA: Diagnosis not present

## 2023-09-22 DIAGNOSIS — N186 End stage renal disease: Secondary | ICD-10-CM | POA: Diagnosis not present

## 2023-09-22 DIAGNOSIS — Z992 Dependence on renal dialysis: Secondary | ICD-10-CM | POA: Diagnosis not present

## 2023-09-22 DIAGNOSIS — D689 Coagulation defect, unspecified: Secondary | ICD-10-CM | POA: Diagnosis not present

## 2023-09-22 DIAGNOSIS — N2581 Secondary hyperparathyroidism of renal origin: Secondary | ICD-10-CM | POA: Diagnosis not present

## 2023-09-22 DIAGNOSIS — D631 Anemia in chronic kidney disease: Secondary | ICD-10-CM | POA: Diagnosis not present

## 2023-09-24 DIAGNOSIS — I10 Essential (primary) hypertension: Secondary | ICD-10-CM | POA: Diagnosis not present

## 2023-09-24 DIAGNOSIS — M109 Gout, unspecified: Secondary | ICD-10-CM | POA: Diagnosis not present

## 2023-09-24 DIAGNOSIS — N186 End stage renal disease: Secondary | ICD-10-CM | POA: Diagnosis not present

## 2023-09-24 DIAGNOSIS — E7800 Pure hypercholesterolemia, unspecified: Secondary | ICD-10-CM | POA: Diagnosis not present

## 2023-09-24 DIAGNOSIS — I252 Old myocardial infarction: Secondary | ICD-10-CM | POA: Diagnosis not present

## 2023-09-24 DIAGNOSIS — I700 Atherosclerosis of aorta: Secondary | ICD-10-CM | POA: Diagnosis not present

## 2023-09-24 DIAGNOSIS — I2510 Atherosclerotic heart disease of native coronary artery without angina pectoris: Secondary | ICD-10-CM | POA: Diagnosis not present

## 2023-09-24 DIAGNOSIS — C50919 Malignant neoplasm of unspecified site of unspecified female breast: Secondary | ICD-10-CM | POA: Diagnosis not present

## 2023-09-24 DIAGNOSIS — G2581 Restless legs syndrome: Secondary | ICD-10-CM | POA: Diagnosis not present

## 2023-09-24 DIAGNOSIS — N2581 Secondary hyperparathyroidism of renal origin: Secondary | ICD-10-CM | POA: Diagnosis not present

## 2023-09-24 DIAGNOSIS — I4892 Unspecified atrial flutter: Secondary | ICD-10-CM | POA: Diagnosis not present

## 2023-09-24 DIAGNOSIS — Z992 Dependence on renal dialysis: Secondary | ICD-10-CM | POA: Diagnosis not present

## 2023-09-25 DIAGNOSIS — N186 End stage renal disease: Secondary | ICD-10-CM | POA: Diagnosis not present

## 2023-09-25 DIAGNOSIS — N2581 Secondary hyperparathyroidism of renal origin: Secondary | ICD-10-CM | POA: Diagnosis not present

## 2023-09-25 DIAGNOSIS — Z992 Dependence on renal dialysis: Secondary | ICD-10-CM | POA: Diagnosis not present

## 2023-09-25 DIAGNOSIS — D631 Anemia in chronic kidney disease: Secondary | ICD-10-CM | POA: Diagnosis not present

## 2023-09-25 DIAGNOSIS — D689 Coagulation defect, unspecified: Secondary | ICD-10-CM | POA: Diagnosis not present

## 2023-09-26 DIAGNOSIS — N2581 Secondary hyperparathyroidism of renal origin: Secondary | ICD-10-CM | POA: Diagnosis not present

## 2023-09-26 DIAGNOSIS — D689 Coagulation defect, unspecified: Secondary | ICD-10-CM | POA: Diagnosis not present

## 2023-09-26 DIAGNOSIS — D631 Anemia in chronic kidney disease: Secondary | ICD-10-CM | POA: Diagnosis not present

## 2023-09-26 DIAGNOSIS — Z992 Dependence on renal dialysis: Secondary | ICD-10-CM | POA: Diagnosis not present

## 2023-09-26 DIAGNOSIS — N186 End stage renal disease: Secondary | ICD-10-CM | POA: Diagnosis not present

## 2023-09-27 DIAGNOSIS — Z992 Dependence on renal dialysis: Secondary | ICD-10-CM | POA: Diagnosis not present

## 2023-09-27 DIAGNOSIS — N186 End stage renal disease: Secondary | ICD-10-CM | POA: Diagnosis not present

## 2023-09-27 DIAGNOSIS — D689 Coagulation defect, unspecified: Secondary | ICD-10-CM | POA: Diagnosis not present

## 2023-09-27 DIAGNOSIS — N2581 Secondary hyperparathyroidism of renal origin: Secondary | ICD-10-CM | POA: Diagnosis not present

## 2023-09-27 DIAGNOSIS — D631 Anemia in chronic kidney disease: Secondary | ICD-10-CM | POA: Diagnosis not present

## 2023-10-01 DIAGNOSIS — Z992 Dependence on renal dialysis: Secondary | ICD-10-CM | POA: Diagnosis not present

## 2023-10-01 DIAGNOSIS — N186 End stage renal disease: Secondary | ICD-10-CM | POA: Diagnosis not present

## 2023-10-01 DIAGNOSIS — D689 Coagulation defect, unspecified: Secondary | ICD-10-CM | POA: Diagnosis not present

## 2023-10-01 DIAGNOSIS — N2581 Secondary hyperparathyroidism of renal origin: Secondary | ICD-10-CM | POA: Diagnosis not present

## 2023-10-01 DIAGNOSIS — D631 Anemia in chronic kidney disease: Secondary | ICD-10-CM | POA: Diagnosis not present

## 2023-10-04 DIAGNOSIS — N186 End stage renal disease: Secondary | ICD-10-CM | POA: Diagnosis not present

## 2023-10-04 DIAGNOSIS — N2581 Secondary hyperparathyroidism of renal origin: Secondary | ICD-10-CM | POA: Diagnosis not present

## 2023-10-04 DIAGNOSIS — Z992 Dependence on renal dialysis: Secondary | ICD-10-CM | POA: Diagnosis not present

## 2023-10-04 DIAGNOSIS — D689 Coagulation defect, unspecified: Secondary | ICD-10-CM | POA: Diagnosis not present

## 2023-10-04 DIAGNOSIS — D631 Anemia in chronic kidney disease: Secondary | ICD-10-CM | POA: Diagnosis not present

## 2023-10-06 DIAGNOSIS — D689 Coagulation defect, unspecified: Secondary | ICD-10-CM | POA: Diagnosis not present

## 2023-10-06 DIAGNOSIS — N186 End stage renal disease: Secondary | ICD-10-CM | POA: Diagnosis not present

## 2023-10-06 DIAGNOSIS — D631 Anemia in chronic kidney disease: Secondary | ICD-10-CM | POA: Diagnosis not present

## 2023-10-06 DIAGNOSIS — Z992 Dependence on renal dialysis: Secondary | ICD-10-CM | POA: Diagnosis not present

## 2023-10-06 DIAGNOSIS — N2581 Secondary hyperparathyroidism of renal origin: Secondary | ICD-10-CM | POA: Diagnosis not present

## 2023-10-08 DIAGNOSIS — N186 End stage renal disease: Secondary | ICD-10-CM | POA: Diagnosis not present

## 2023-10-08 DIAGNOSIS — I10 Essential (primary) hypertension: Secondary | ICD-10-CM | POA: Diagnosis not present

## 2023-10-08 DIAGNOSIS — Z992 Dependence on renal dialysis: Secondary | ICD-10-CM | POA: Diagnosis not present

## 2023-10-08 DIAGNOSIS — I120 Hypertensive chronic kidney disease with stage 5 chronic kidney disease or end stage renal disease: Secondary | ICD-10-CM | POA: Diagnosis not present

## 2023-10-08 DIAGNOSIS — I2510 Atherosclerotic heart disease of native coronary artery without angina pectoris: Secondary | ICD-10-CM | POA: Diagnosis not present

## 2023-10-09 DIAGNOSIS — N2581 Secondary hyperparathyroidism of renal origin: Secondary | ICD-10-CM | POA: Diagnosis not present

## 2023-10-09 DIAGNOSIS — Z992 Dependence on renal dialysis: Secondary | ICD-10-CM | POA: Diagnosis not present

## 2023-10-09 DIAGNOSIS — D631 Anemia in chronic kidney disease: Secondary | ICD-10-CM | POA: Diagnosis not present

## 2023-10-09 DIAGNOSIS — D689 Coagulation defect, unspecified: Secondary | ICD-10-CM | POA: Diagnosis not present

## 2023-10-09 DIAGNOSIS — D509 Iron deficiency anemia, unspecified: Secondary | ICD-10-CM | POA: Diagnosis not present

## 2023-10-09 DIAGNOSIS — N186 End stage renal disease: Secondary | ICD-10-CM | POA: Diagnosis not present

## 2023-10-11 DIAGNOSIS — N2581 Secondary hyperparathyroidism of renal origin: Secondary | ICD-10-CM | POA: Diagnosis not present

## 2023-10-11 DIAGNOSIS — D509 Iron deficiency anemia, unspecified: Secondary | ICD-10-CM | POA: Diagnosis not present

## 2023-10-11 DIAGNOSIS — Z992 Dependence on renal dialysis: Secondary | ICD-10-CM | POA: Diagnosis not present

## 2023-10-11 DIAGNOSIS — D631 Anemia in chronic kidney disease: Secondary | ICD-10-CM | POA: Diagnosis not present

## 2023-10-11 DIAGNOSIS — N186 End stage renal disease: Secondary | ICD-10-CM | POA: Diagnosis not present

## 2023-10-11 DIAGNOSIS — D689 Coagulation defect, unspecified: Secondary | ICD-10-CM | POA: Diagnosis not present

## 2023-10-13 DIAGNOSIS — D509 Iron deficiency anemia, unspecified: Secondary | ICD-10-CM | POA: Diagnosis not present

## 2023-10-13 DIAGNOSIS — N186 End stage renal disease: Secondary | ICD-10-CM | POA: Diagnosis not present

## 2023-10-13 DIAGNOSIS — D631 Anemia in chronic kidney disease: Secondary | ICD-10-CM | POA: Diagnosis not present

## 2023-10-13 DIAGNOSIS — N2581 Secondary hyperparathyroidism of renal origin: Secondary | ICD-10-CM | POA: Diagnosis not present

## 2023-10-13 DIAGNOSIS — D689 Coagulation defect, unspecified: Secondary | ICD-10-CM | POA: Diagnosis not present

## 2023-10-13 DIAGNOSIS — Z992 Dependence on renal dialysis: Secondary | ICD-10-CM | POA: Diagnosis not present

## 2023-10-16 DIAGNOSIS — Z992 Dependence on renal dialysis: Secondary | ICD-10-CM | POA: Diagnosis not present

## 2023-10-16 DIAGNOSIS — N2581 Secondary hyperparathyroidism of renal origin: Secondary | ICD-10-CM | POA: Diagnosis not present

## 2023-10-16 DIAGNOSIS — D689 Coagulation defect, unspecified: Secondary | ICD-10-CM | POA: Diagnosis not present

## 2023-10-16 DIAGNOSIS — D509 Iron deficiency anemia, unspecified: Secondary | ICD-10-CM | POA: Diagnosis not present

## 2023-10-16 DIAGNOSIS — D631 Anemia in chronic kidney disease: Secondary | ICD-10-CM | POA: Diagnosis not present

## 2023-10-16 DIAGNOSIS — N186 End stage renal disease: Secondary | ICD-10-CM | POA: Diagnosis not present

## 2023-10-18 DIAGNOSIS — N2581 Secondary hyperparathyroidism of renal origin: Secondary | ICD-10-CM | POA: Diagnosis not present

## 2023-10-18 DIAGNOSIS — N186 End stage renal disease: Secondary | ICD-10-CM | POA: Diagnosis not present

## 2023-10-18 DIAGNOSIS — D689 Coagulation defect, unspecified: Secondary | ICD-10-CM | POA: Diagnosis not present

## 2023-10-18 DIAGNOSIS — D631 Anemia in chronic kidney disease: Secondary | ICD-10-CM | POA: Diagnosis not present

## 2023-10-18 DIAGNOSIS — D509 Iron deficiency anemia, unspecified: Secondary | ICD-10-CM | POA: Diagnosis not present

## 2023-10-18 DIAGNOSIS — Z992 Dependence on renal dialysis: Secondary | ICD-10-CM | POA: Diagnosis not present

## 2023-10-19 ENCOUNTER — Ambulatory Visit (HOSPITAL_COMMUNITY)
Admission: RE | Admit: 2023-10-19 | Discharge: 2023-10-19 | Disposition: A | Discharge status: Home / Self Care | Source: Ambulatory Visit | Attending: Internal Medicine | Admitting: Internal Medicine

## 2023-10-19 DIAGNOSIS — I2510 Atherosclerotic heart disease of native coronary artery without angina pectoris: Secondary | ICD-10-CM | POA: Diagnosis not present

## 2023-10-19 DIAGNOSIS — N186 End stage renal disease: Secondary | ICD-10-CM | POA: Insufficient documentation

## 2023-10-19 DIAGNOSIS — I5020 Unspecified systolic (congestive) heart failure: Secondary | ICD-10-CM | POA: Insufficient documentation

## 2023-10-19 DIAGNOSIS — I10 Essential (primary) hypertension: Secondary | ICD-10-CM | POA: Insufficient documentation

## 2023-10-19 DIAGNOSIS — I480 Paroxysmal atrial fibrillation: Secondary | ICD-10-CM | POA: Insufficient documentation

## 2023-10-19 LAB — ECHOCARDIOGRAM COMPLETE
Area-P 1/2: 4.52 cm2
S' Lateral: 3.34 cm

## 2023-10-20 DIAGNOSIS — N186 End stage renal disease: Secondary | ICD-10-CM | POA: Diagnosis not present

## 2023-10-20 DIAGNOSIS — D509 Iron deficiency anemia, unspecified: Secondary | ICD-10-CM | POA: Diagnosis not present

## 2023-10-20 DIAGNOSIS — N2581 Secondary hyperparathyroidism of renal origin: Secondary | ICD-10-CM | POA: Diagnosis not present

## 2023-10-20 DIAGNOSIS — D689 Coagulation defect, unspecified: Secondary | ICD-10-CM | POA: Diagnosis not present

## 2023-10-20 DIAGNOSIS — D631 Anemia in chronic kidney disease: Secondary | ICD-10-CM | POA: Diagnosis not present

## 2023-10-20 DIAGNOSIS — Z992 Dependence on renal dialysis: Secondary | ICD-10-CM | POA: Diagnosis not present

## 2023-10-21 ENCOUNTER — Ambulatory Visit: Payer: Self-pay | Admitting: Nurse Practitioner

## 2023-10-22 DIAGNOSIS — N186 End stage renal disease: Secondary | ICD-10-CM | POA: Diagnosis not present

## 2023-10-22 DIAGNOSIS — D631 Anemia in chronic kidney disease: Secondary | ICD-10-CM | POA: Diagnosis not present

## 2023-10-22 DIAGNOSIS — Z992 Dependence on renal dialysis: Secondary | ICD-10-CM | POA: Diagnosis not present

## 2023-10-22 DIAGNOSIS — D509 Iron deficiency anemia, unspecified: Secondary | ICD-10-CM | POA: Diagnosis not present

## 2023-10-22 DIAGNOSIS — D689 Coagulation defect, unspecified: Secondary | ICD-10-CM | POA: Diagnosis not present

## 2023-10-22 DIAGNOSIS — N2581 Secondary hyperparathyroidism of renal origin: Secondary | ICD-10-CM | POA: Diagnosis not present

## 2023-10-23 ENCOUNTER — Emergency Department (HOSPITAL_COMMUNITY)
Admission: EM | Admit: 2023-10-23 | Discharge: 2023-10-23 | Disposition: A | Discharge status: Home / Self Care | Attending: Emergency Medicine | Admitting: Emergency Medicine

## 2023-10-23 ENCOUNTER — Emergency Department (HOSPITAL_COMMUNITY)

## 2023-10-23 ENCOUNTER — Encounter (HOSPITAL_COMMUNITY): Payer: Self-pay

## 2023-10-23 ENCOUNTER — Other Ambulatory Visit: Payer: Self-pay

## 2023-10-23 DIAGNOSIS — I499 Cardiac arrhythmia, unspecified: Secondary | ICD-10-CM | POA: Diagnosis not present

## 2023-10-23 DIAGNOSIS — E875 Hyperkalemia: Secondary | ICD-10-CM | POA: Insufficient documentation

## 2023-10-23 DIAGNOSIS — Z7901 Long term (current) use of anticoagulants: Secondary | ICD-10-CM | POA: Insufficient documentation

## 2023-10-23 DIAGNOSIS — R0600 Dyspnea, unspecified: Secondary | ICD-10-CM | POA: Diagnosis not present

## 2023-10-23 DIAGNOSIS — E8770 Fluid overload, unspecified: Secondary | ICD-10-CM | POA: Diagnosis not present

## 2023-10-23 DIAGNOSIS — J9801 Acute bronchospasm: Secondary | ICD-10-CM | POA: Insufficient documentation

## 2023-10-23 DIAGNOSIS — J811 Chronic pulmonary edema: Secondary | ICD-10-CM | POA: Diagnosis not present

## 2023-10-23 DIAGNOSIS — Z743 Need for continuous supervision: Secondary | ICD-10-CM | POA: Diagnosis not present

## 2023-10-23 DIAGNOSIS — R0989 Other specified symptoms and signs involving the circulatory and respiratory systems: Secondary | ICD-10-CM | POA: Diagnosis not present

## 2023-10-23 DIAGNOSIS — I509 Heart failure, unspecified: Secondary | ICD-10-CM | POA: Diagnosis not present

## 2023-10-23 DIAGNOSIS — R0689 Other abnormalities of breathing: Secondary | ICD-10-CM | POA: Diagnosis not present

## 2023-10-23 DIAGNOSIS — R0602 Shortness of breath: Secondary | ICD-10-CM | POA: Diagnosis not present

## 2023-10-23 DIAGNOSIS — R6889 Other general symptoms and signs: Secondary | ICD-10-CM | POA: Diagnosis not present

## 2023-10-23 LAB — CBC WITH DIFFERENTIAL/PLATELET
Abs Immature Granulocytes: 0.19 K/uL — ABNORMAL HIGH (ref 0.00–0.07)
Basophils Absolute: 0 K/uL (ref 0.0–0.1)
Basophils Relative: 0 %
Eosinophils Absolute: 0.1 K/uL (ref 0.0–0.5)
Eosinophils Relative: 1 %
HCT: 33.2 % — ABNORMAL LOW (ref 36.0–46.0)
Hemoglobin: 11.2 g/dL — ABNORMAL LOW (ref 12.0–15.0)
Immature Granulocytes: 2 %
Lymphocytes Relative: 12 %
Lymphs Abs: 1.2 K/uL (ref 0.7–4.0)
MCH: 32.4 pg (ref 26.0–34.0)
MCHC: 33.7 g/dL (ref 30.0–36.0)
MCV: 96 fL (ref 80.0–100.0)
Monocytes Absolute: 0.6 K/uL (ref 0.1–1.0)
Monocytes Relative: 5 %
Neutro Abs: 8.6 K/uL — ABNORMAL HIGH (ref 1.7–7.7)
Neutrophils Relative %: 80 %
Platelets: 221 K/uL (ref 150–400)
RBC: 3.46 MIL/uL — ABNORMAL LOW (ref 3.87–5.11)
RDW: 14.4 % (ref 11.5–15.5)
WBC: 10.7 K/uL — ABNORMAL HIGH (ref 4.0–10.5)
nRBC: 0 % (ref 0.0–0.2)

## 2023-10-23 LAB — HEPATITIS B SURFACE ANTIGEN: Hepatitis B Surface Ag: NONREACTIVE

## 2023-10-23 LAB — TROPONIN I (HIGH SENSITIVITY)
Troponin I (High Sensitivity): 25 ng/L — ABNORMAL HIGH (ref <18)
Troponin I (High Sensitivity): 32 ng/L — ABNORMAL HIGH (ref <18)

## 2023-10-23 LAB — COMPREHENSIVE METABOLIC PANEL WITH GFR
ALT: 11 U/L (ref 0–44)
AST: 17 U/L (ref 15–41)
Albumin: 3.5 g/dL (ref 3.5–5.0)
Alkaline Phosphatase: 88 U/L (ref 38–126)
Anion gap: 12 (ref 5–15)
BUN: 69 mg/dL — ABNORMAL HIGH (ref 8–23)
CO2: 21 mmol/L — ABNORMAL LOW (ref 22–32)
Calcium: 9.7 mg/dL (ref 8.9–10.3)
Chloride: 105 mmol/L (ref 98–111)
Creatinine, Ser: 6.88 mg/dL — ABNORMAL HIGH (ref 0.44–1.00)
GFR, Estimated: 6 mL/min — ABNORMAL LOW (ref >60)
Glucose, Bld: 101 mg/dL — ABNORMAL HIGH (ref 70–99)
Potassium: 5.2 mmol/L — ABNORMAL HIGH (ref 3.5–5.1)
Sodium: 138 mmol/L (ref 135–145)
Total Bilirubin: 0.9 mg/dL (ref 0.0–1.2)
Total Protein: 6.2 g/dL — ABNORMAL LOW (ref 6.5–8.1)

## 2023-10-23 LAB — BRAIN NATRIURETIC PEPTIDE: B Natriuretic Peptide: 1710.2 pg/mL — ABNORMAL HIGH (ref 0.0–100.0)

## 2023-10-23 MED ORDER — PANTOPRAZOLE SODIUM 40 MG PO TBEC
40.0000 mg | DELAYED_RELEASE_TABLET | Freq: Every day | ORAL | Status: DC
  Administered 2023-10-23: 40 mg via ORAL
  Filled 2023-10-23: qty 1

## 2023-10-23 MED ORDER — ACETAMINOPHEN 325 MG PO TABS
ORAL_TABLET | ORAL | Status: AC
  Filled 2023-10-23: qty 2

## 2023-10-23 MED ORDER — METOPROLOL SUCCINATE ER 25 MG PO TB24: 100.0000 mg | ORAL_TABLET | Freq: Every day | ORAL | Status: DC

## 2023-10-23 MED ORDER — FEBUXOSTAT 40 MG PO TABS
40.0000 mg | ORAL_TABLET | Freq: Every day | ORAL | Status: DC
  Filled 2023-10-23: qty 1

## 2023-10-23 MED ORDER — APIXABAN 5 MG PO TABS
5.0000 mg | ORAL_TABLET | Freq: Two times a day (BID) | ORAL | Status: DC
  Administered 2023-10-23: 5 mg via ORAL
  Filled 2023-10-23: qty 1

## 2023-10-23 MED ORDER — ROPINIROLE HCL 1 MG PO TABS: 0.5000 mg | ORAL_TABLET | Freq: Two times a day (BID) | ORAL | Status: DC | PRN

## 2023-10-23 MED ORDER — FUROSEMIDE 20 MG PO TABS: 80.0000 mg | ORAL_TABLET | Freq: Every day | ORAL | Status: DC | PRN

## 2023-10-23 MED ORDER — CHLORHEXIDINE GLUCONATE CLOTH 2 % EX PADS: 6.0000 | MEDICATED_PAD | Freq: Every day | CUTANEOUS | Status: DC

## 2023-10-23 MED ORDER — ACETAMINOPHEN 325 MG PO TABS
650.0000 mg | ORAL_TABLET | Freq: Once | ORAL | Status: AC
  Administered 2023-10-23: 650 mg via ORAL

## 2023-10-23 MED ORDER — ROPINIROLE HCL 1 MG PO TABS
0.5000 mg | ORAL_TABLET | Freq: Once | ORAL | Status: AC
  Administered 2023-10-23: 0.5 mg via ORAL
  Filled 2023-10-23: qty 1

## 2023-10-23 MED ORDER — ATORVASTATIN CALCIUM 40 MG PO TABS: 80.0000 mg | ORAL_TABLET | Freq: Every morning | ORAL | Status: DC

## 2023-10-23 NOTE — ED Provider Notes (Signed)
 [place] EMERGENCY DEPARTMENT AT [facility] Provider Note   CSN: [id number] Arrival date & time: 10/23/23  9792     Patient presents with: Shortness of Breath   Rachel Villarreal is a 78 y.o. female.   Patient brought to the emergency department from home for shortness of breath.  She reports that she was in bed talking on the phone when she suddenly became short of breath.  She used her albuterol  inhaler and it briefly helped but then she started getting short of breath again.  When she got up and tried to walk her breathing worsened, called EMS.  She comes to the ED on supplemental oxygen by nasal cannula.  She received a nebulizer treatment during transport and reports that she feels improved.       Prior to Admission medications   Medication Sig Start Date End Date Taking? Authorizing Provider  acetaminophen  (TYLENOL ) 500 MG tablet Take 500 mg by mouth every 6 (six) hours as needed for mild pain (pain score 1-3).    [provider]  anastrozole  (ARIMIDEX ) 1 MG tablet TAKE 1 TABLET BY MOUTH ONCE A DAY FOR BREAST CANCER 06/26/23   Odean Potts, MD  apixaban  (ELIQUIS ) 5 MG TABS tablet Take 1 tablet (5 mg total) by mouth 2 (two) times daily. 06/05/23   Santo Stanly LABOR, MD  atorvastatin  (LIPITOR ) 80 MG tablet Take 1 tablet (80 mg total) by mouth in the morning. 06/05/23   Chandrasekhar, Mahesh A, MD  budesonide  (RHINOCORT  AQUA) 32 MCG/ACT nasal spray Place 2 sprays into both nostrils in the morning. 12/12/22   [provider]  Cholecalciferol 50 MCG (2000 UT) CAPS Take 2,000 Units by mouth in the morning. 12/27/22   [provider]  diclofenac  Sodium (VOLTAREN ) 1 % GEL Apply 1 Application topically 4 (four) times daily as needed (pain.).    [provider]  docusate sodium  (COLACE) 100 MG capsule Take 100 mg by mouth 2 (two) times daily.    [provider]  eszopiclone (LUNESTA) 2 MG TABS tablet Take 2 mg by mouth at bedtime.  03/27/23   [provider]  febuxostat  (ULORIC ) 40 MG tablet Take 40 mg by mouth in the morning.    [provider]  furosemide  (LASIX ) 80 MG tablet Take 80 mg by mouth daily as needed for fluid or edema. 09/01/23   [provider]  loratadine  (CLARITIN ) 10 MG tablet Take 10 mg by mouth 2 (two) times daily. 12/12/22   [provider]  Methoxy PEG-Epoetin  Beta (MIRCERA IJ) 60 mcg. 08/07/23 08/05/24  [provider]  metoprolol  succinate (TOPROL -XL) 100 MG 24 hr tablet Take 0.5-1 tablet (50-100 mg total) by mouth daily as needed for elevated heart rate. Take with or immediately following a meal. Patient taking differently: Take 100 mg by mouth daily. 06/05/23   Santo Stanly LABOR, MD  Multiple Vitamins-Minerals (PRESERVISION AREDS 2+MULTI VIT PO) Take 1 capsule by mouth 2 (two) times daily.    [provider]  pantoprazole  (PROTONIX ) 40 MG tablet Take 1 tablet (40 mg total) by mouth daily. 06/05/23   Santo Stanly LABOR, MD  PROAIR  HFA 108 (90 BASE) MCG/ACT inhaler Inhale 1-2 puffs into the lungs every 6 (six) hours as needed for shortness of breath or wheezing (for respiratory issues.). 12/03/14   [provider]  rOPINIRole  (REQUIP ) 0.5 MG tablet Take 1 tablet (0.5 mg total) by mouth every 12 (twelve) hours as needed (Restless legs). Patient taking differently: Take 0.5  mg by mouth every 12 (twelve) hours as needed (Restless legs). Takes 1 tablet twice daily on dialysis days and 1 tablet once daily on the other days 04/25/23   Jillian Buttery, MD  sevelamer  carbonate (RENVELA ) 800 MG tablet Take 800 mg by mouth. 2 tablets tid with meals 03/15/23   [provider]    Allergies: Ace inhibitors, Lactose intolerance (gi), Letrozole , Lisinopril, Mercury, and Tape    Review of Systems  Updated Vital Signs BP (!) 161/88   Pulse (!) 59   Temp 98.1 F (36.7 C) (Oral)   Resp 18   SpO2 100%   Physical Exam Vitals and nursing note  reviewed.  Constitutional:      General: She is not in acute distress.    Appearance: She is well-developed.  HENT:     Head: Normocephalic and atraumatic.     Mouth/Throat:     Mouth: Mucous membranes are moist.  Eyes:     General: Vision grossly intact. Gaze aligned appropriately.     Extraocular Movements: Extraocular movements intact.     Conjunctiva/sclera: Conjunctivae normal.  Cardiovascular:     Rate and Rhythm: Normal rate and regular rhythm.     Pulses: Normal pulses.     Heart sounds: Normal heart sounds, S1 normal and S2 normal. No murmur heard.    No friction rub. No gallop.  Pulmonary:     Effort: Pulmonary effort is normal. No respiratory distress.     Breath sounds: Normal breath sounds.  Abdominal:     General: Bowel sounds are normal.     Palpations: Abdomen is soft.     Tenderness: There is no abdominal tenderness. There is no guarding or rebound.     Hernia: No hernia is present.  Musculoskeletal:        General: No swelling.     Cervical back: Full passive range of motion without pain, normal range of motion and neck supple. No spinous process tenderness or muscular tenderness. Normal range of motion.     Right lower leg: No edema.     Left lower leg: No edema.  Skin:    General: Skin is warm and dry.     Capillary Refill: Capillary refill takes less than 2 seconds.     Findings: No ecchymosis, erythema, rash or wound.  Neurological:     General: No focal deficit present.     Mental Status: She is alert and oriented to person, place, and time.     GCS: GCS eye subscore is 4. GCS verbal subscore is 5. GCS motor subscore is 6.     Cranial Nerves: Cranial nerves 2-12 are intact.     Sensory: Sensation is intact.     Motor: Motor function is intact.     Coordination: Coordination is intact.  Psychiatric:        Attention and Perception: Attention normal.        Mood and Affect: Mood normal.        Speech: Speech normal.        Behavior: Behavior normal.      (all labs ordered are listed, but only abnormal results are displayed) Labs Reviewed  CBC WITH DIFFERENTIAL/PLATELET - Abnormal; Notable for the following components:      Result Value   WBC 10.7 (*)    RBC 3.46 (*)    Hemoglobin 11.2 (*)    HCT 33.2 (*)    Neutro Abs 8.6 (*)    Abs Immature Granulocytes 0.19 (*)  All other components within normal limits  COMPREHENSIVE METABOLIC PANEL WITH GFR - Abnormal; Notable for the following components:   Potassium 5.2 (*)    CO2 21 (*)    Glucose, Bld 101 (*)    BUN 69 (*)    Creatinine, Ser 6.88 (*)    Total Protein 6.2 (*)    GFR, Estimated 6 (*)    All other components within normal limits  BRAIN NATRIURETIC PEPTIDE - Abnormal; Notable for the following components:   B Natriuretic Peptide 1,710.2 (*)    All other components within normal limits  TROPONIN I (HIGH SENSITIVITY) - Abnormal; Notable for the following components:   Troponin I (High Sensitivity) 25 (*)    All other components within normal limits  HEPATITIS B SURFACE ANTIGEN  HEPATITIS B SURFACE ANTIBODY, QUANTITATIVE  TROPONIN I (HIGH SENSITIVITY)    EKG: EKG Interpretation Date/Time:  Tuesday October 23 2023 02:17:03 EDT Ventricular Rate:  102 PR Interval:  167 QRS Duration:  83 QT Interval:  351 QTC Calculation: 451 R Axis:   107  Text Interpretation: Right and left arm electrode reversal, interpretation assumes no reversal Sinus tachycardia Multiple premature complexes, vent & supraven Probable lateral infarct, age indeterminate Minimal ST elevation, inferior leads Confirmed by Haze Lonni PARAS [phone]) on 10/23/2023 4:20:28 AM  Radiology: [facility] Chest Port 1 View Result Date: 10/23/2023 CLINICAL DATA:  Shortness of breath EXAM: PORTABLE CHEST 1 VIEW COMPARISON:  04/19/2023 FINDINGS: Cardiac shadow is enlarged but stable. Dialysis catheter has been removed. Increased central vascular congestion is noted with mild interstitial edema. No focal confluent  infiltrate is seen. IMPRESSION: Changes of CHF. Electronically Signed   By: Oneil Devonshire M.D.   On: 10/23/2023 02:56     Procedures   Medications Ordered in the ED  Chlorhexidine  Gluconate Cloth 2 % PADS 6 each (has no administration in time range)                                    Medical Decision Making Amount and/or Complexity of Data Reviewed Labs: ordered. Radiology: ordered.   Differential Diagnosis considered includes, but not limited to: Asthma exacerbation; Bronchitis; Pneumonia; CHF; ACS; PE   Patient presents to the emergency department for evaluation of shortness of breath.  Patient reports that she was talking on the phone when she suddenly felt short of breath.  Patient is a dialysis patient, has not missed any dialysis sessions.  She is due for dialysis later this morning.  Patient does report that she has some mild bronchospasm since she had pneumonia some months ago.  She uses her inhaler twice a day.  She tried her inhaler tonight but it did not seem to help.  She has brought to the emergency department on oxygen, started by EMS.  She was given a DuoNeb and she does feel improvement at arrival.  She is not sure if it is the oxygen or the DuoNeb that helped.  Workup here reveals very slight hyperkalemia at 5.2.  Chest x-ray shows evidence of volume overload which explains her symptoms.  Discussed with Dr. Dalene, on-call for nephrology.  Patient will be held in the emergency department to receive dialysis this morning.  She will return to the ED for repeat evaluation at that time.     Final diagnoses:  Hypervolemia, unspecified hypervolemia type    ED Discharge Orders     None  Haze Lonni PARAS, MD 10/23/23 [phone]

## 2023-10-23 NOTE — Discharge Instructions (Signed)
 Please go to your regularly scheduled dialysis.  Continue your home medications.  Return with any new or suddenly worsening symptoms.

## 2023-10-23 NOTE — ED Triage Notes (Signed)
 Pt presents via EMS c/o SOB. EMS reports saturation 88% on room air. T/TH/Sat dialysis patient. Had Full dialysis treatment on Saturday per EMS.  Given duoneb by EMS. Currently on 3L via [place] with saturation 97%. Hx Asthma per EMS.

## 2023-10-23 NOTE — ED Provider Notes (Signed)
 Blood pressure (!) 181/88, pulse (!) 106, temperature 98.2 F (36.8 C), temperature source Oral, resp. rate 20, SpO2 98%.  Assuming care from Dr. Haze.  In short, Rachel Villarreal is a 78 y.o. female with a chief complaint of Shortness of Breath .  Refer to the original H&P for additional details.  The current plan of care is for patient to go to HD this AM and possibly discharge if she is feeling better and O2 is weaned. Nephrology aware.   01:55 PM  Patient is back from HD. Feeling much better. Will wean O2 and likely discharge.   02:07 PM  Off o2. O2 saturations normal. Patient feeling well. Stable for discharge.    [name], MD 10/23/23 1407

## 2023-10-23 NOTE — Progress Notes (Signed)
 Late Note Entry- October 23, 2023  Contacted [facility] to be advised that pt came to ED, had HD, and then was d/c to home from ED.   Randine Mungo Renal Navigator [phone]

## 2023-10-23 NOTE — Progress Notes (Signed)
   10/23/23 1221  Vitals  Temp 97.6 F (36.4 C)  Pulse Rate (!) 25  Resp (!) 27  BP (!) 145/106  SpO2 96 %  Weight  (STRETCHER)  Post Treatment  Dialyzer Clearance Lightly streaked  Hemodialysis Intake (mL) 200 mL  Liters Processed 75  Fluid Removed (mL) 2300 mL  Tolerated HD Treatment Yes  AVG/AVF Arterial Site Held (minutes) 10 minutes  AVG/AVF Venous Site Held (minutes) 10 minutes   Received patient in bed to unit.  Alert and oriented.  Informed consent signed and in chart.   TX duration:3HRS 35 MINS DUE TO FEELING BAD  Patient tolerated well.  Transported back to the room  Alert, without acute distress.  Hand-off given to patient's nurse.   Access used: RAVF Access issues: NONE  Total UF removed: 2.3L Medication(s) given: TYLENOL     [facility]

## 2023-10-23 NOTE — ED Notes (Signed)
 Report given to dialysis.

## 2023-10-23 NOTE — ED Notes (Signed)
 Patient awaiting to go to hemodialysis.

## 2023-10-23 NOTE — Progress Notes (Signed)
 Heart Failure Navigator Progress Note  Assessed for Heart & Vascular TOC clinic readiness.  Patient does not meet criteria due to ESRD on hemodialysis.   Navigator will sign off at this time.   Rhae Hammock, BSN, Scientist, clinical (histocompatibility and immunogenetics) Only

## 2023-10-24 LAB — HEPATITIS B SURFACE ANTIBODY, QUANTITATIVE: Hep B S AB Quant (Post): 28.7 m[IU]/mL

## 2023-10-25 DIAGNOSIS — N186 End stage renal disease: Secondary | ICD-10-CM | POA: Diagnosis not present

## 2023-10-25 DIAGNOSIS — N2581 Secondary hyperparathyroidism of renal origin: Secondary | ICD-10-CM | POA: Diagnosis not present

## 2023-10-25 DIAGNOSIS — D631 Anemia in chronic kidney disease: Secondary | ICD-10-CM | POA: Diagnosis not present

## 2023-10-25 DIAGNOSIS — D509 Iron deficiency anemia, unspecified: Secondary | ICD-10-CM | POA: Diagnosis not present

## 2023-10-25 DIAGNOSIS — Z992 Dependence on renal dialysis: Secondary | ICD-10-CM | POA: Diagnosis not present

## 2023-10-25 DIAGNOSIS — D689 Coagulation defect, unspecified: Secondary | ICD-10-CM | POA: Diagnosis not present

## 2023-10-26 DIAGNOSIS — G2581 Restless legs syndrome: Secondary | ICD-10-CM | POA: Diagnosis not present

## 2023-10-26 DIAGNOSIS — G4700 Insomnia, unspecified: Secondary | ICD-10-CM | POA: Diagnosis not present

## 2023-10-26 DIAGNOSIS — I509 Heart failure, unspecified: Secondary | ICD-10-CM | POA: Diagnosis not present

## 2023-10-27 DIAGNOSIS — D509 Iron deficiency anemia, unspecified: Secondary | ICD-10-CM | POA: Diagnosis not present

## 2023-10-27 DIAGNOSIS — Z992 Dependence on renal dialysis: Secondary | ICD-10-CM | POA: Diagnosis not present

## 2023-10-27 DIAGNOSIS — N186 End stage renal disease: Secondary | ICD-10-CM | POA: Diagnosis not present

## 2023-10-27 DIAGNOSIS — N2581 Secondary hyperparathyroidism of renal origin: Secondary | ICD-10-CM | POA: Diagnosis not present

## 2023-10-27 DIAGNOSIS — D689 Coagulation defect, unspecified: Secondary | ICD-10-CM | POA: Diagnosis not present

## 2023-10-27 DIAGNOSIS — D631 Anemia in chronic kidney disease: Secondary | ICD-10-CM | POA: Diagnosis not present

## 2023-10-30 DIAGNOSIS — D631 Anemia in chronic kidney disease: Secondary | ICD-10-CM | POA: Diagnosis not present

## 2023-10-30 DIAGNOSIS — D689 Coagulation defect, unspecified: Secondary | ICD-10-CM | POA: Diagnosis not present

## 2023-10-30 DIAGNOSIS — D509 Iron deficiency anemia, unspecified: Secondary | ICD-10-CM | POA: Diagnosis not present

## 2023-10-30 DIAGNOSIS — N186 End stage renal disease: Secondary | ICD-10-CM | POA: Diagnosis not present

## 2023-10-30 DIAGNOSIS — Z992 Dependence on renal dialysis: Secondary | ICD-10-CM | POA: Diagnosis not present

## 2023-10-30 DIAGNOSIS — N2581 Secondary hyperparathyroidism of renal origin: Secondary | ICD-10-CM | POA: Diagnosis not present

## 2023-11-01 DIAGNOSIS — D631 Anemia in chronic kidney disease: Secondary | ICD-10-CM | POA: Diagnosis not present

## 2023-11-01 DIAGNOSIS — N2581 Secondary hyperparathyroidism of renal origin: Secondary | ICD-10-CM | POA: Diagnosis not present

## 2023-11-01 DIAGNOSIS — D689 Coagulation defect, unspecified: Secondary | ICD-10-CM | POA: Diagnosis not present

## 2023-11-01 DIAGNOSIS — N186 End stage renal disease: Secondary | ICD-10-CM | POA: Diagnosis not present

## 2023-11-01 DIAGNOSIS — D509 Iron deficiency anemia, unspecified: Secondary | ICD-10-CM | POA: Diagnosis not present

## 2023-11-01 DIAGNOSIS — Z992 Dependence on renal dialysis: Secondary | ICD-10-CM | POA: Diagnosis not present

## 2023-11-03 DIAGNOSIS — N2581 Secondary hyperparathyroidism of renal origin: Secondary | ICD-10-CM | POA: Diagnosis not present

## 2023-11-03 DIAGNOSIS — D631 Anemia in chronic kidney disease: Secondary | ICD-10-CM | POA: Diagnosis not present

## 2023-11-03 DIAGNOSIS — Z992 Dependence on renal dialysis: Secondary | ICD-10-CM | POA: Diagnosis not present

## 2023-11-03 DIAGNOSIS — D509 Iron deficiency anemia, unspecified: Secondary | ICD-10-CM | POA: Diagnosis not present

## 2023-11-03 DIAGNOSIS — N186 End stage renal disease: Secondary | ICD-10-CM | POA: Diagnosis not present

## 2023-11-03 DIAGNOSIS — D689 Coagulation defect, unspecified: Secondary | ICD-10-CM | POA: Diagnosis not present

## 2023-11-06 DIAGNOSIS — Z992 Dependence on renal dialysis: Secondary | ICD-10-CM | POA: Diagnosis not present

## 2023-11-06 DIAGNOSIS — D509 Iron deficiency anemia, unspecified: Secondary | ICD-10-CM | POA: Diagnosis not present

## 2023-11-06 DIAGNOSIS — D689 Coagulation defect, unspecified: Secondary | ICD-10-CM | POA: Diagnosis not present

## 2023-11-06 DIAGNOSIS — D631 Anemia in chronic kidney disease: Secondary | ICD-10-CM | POA: Diagnosis not present

## 2023-11-06 DIAGNOSIS — N186 End stage renal disease: Secondary | ICD-10-CM | POA: Diagnosis not present

## 2023-11-06 DIAGNOSIS — N2581 Secondary hyperparathyroidism of renal origin: Secondary | ICD-10-CM | POA: Diagnosis not present

## 2023-11-08 DIAGNOSIS — N186 End stage renal disease: Secondary | ICD-10-CM | POA: Diagnosis not present

## 2023-11-08 DIAGNOSIS — D689 Coagulation defect, unspecified: Secondary | ICD-10-CM | POA: Diagnosis not present

## 2023-11-08 DIAGNOSIS — D631 Anemia in chronic kidney disease: Secondary | ICD-10-CM | POA: Diagnosis not present

## 2023-11-08 DIAGNOSIS — I10 Essential (primary) hypertension: Secondary | ICD-10-CM | POA: Diagnosis not present

## 2023-11-08 DIAGNOSIS — N2581 Secondary hyperparathyroidism of renal origin: Secondary | ICD-10-CM | POA: Diagnosis not present

## 2023-11-08 DIAGNOSIS — I2510 Atherosclerotic heart disease of native coronary artery without angina pectoris: Secondary | ICD-10-CM | POA: Diagnosis not present

## 2023-11-08 DIAGNOSIS — D509 Iron deficiency anemia, unspecified: Secondary | ICD-10-CM | POA: Diagnosis not present

## 2023-11-08 DIAGNOSIS — I120 Hypertensive chronic kidney disease with stage 5 chronic kidney disease or end stage renal disease: Secondary | ICD-10-CM | POA: Diagnosis not present

## 2023-11-08 DIAGNOSIS — Z992 Dependence on renal dialysis: Secondary | ICD-10-CM | POA: Diagnosis not present

## 2023-11-10 DIAGNOSIS — N186 End stage renal disease: Secondary | ICD-10-CM | POA: Diagnosis not present

## 2023-11-10 DIAGNOSIS — D631 Anemia in chronic kidney disease: Secondary | ICD-10-CM | POA: Diagnosis not present

## 2023-11-10 DIAGNOSIS — D689 Coagulation defect, unspecified: Secondary | ICD-10-CM | POA: Diagnosis not present

## 2023-11-10 DIAGNOSIS — R197 Diarrhea, unspecified: Secondary | ICD-10-CM | POA: Diagnosis not present

## 2023-11-10 DIAGNOSIS — N2581 Secondary hyperparathyroidism of renal origin: Secondary | ICD-10-CM | POA: Diagnosis not present

## 2023-11-10 DIAGNOSIS — Z992 Dependence on renal dialysis: Secondary | ICD-10-CM | POA: Diagnosis not present

## 2023-11-10 DIAGNOSIS — D509 Iron deficiency anemia, unspecified: Secondary | ICD-10-CM | POA: Diagnosis not present

## 2023-11-13 DIAGNOSIS — D509 Iron deficiency anemia, unspecified: Secondary | ICD-10-CM | POA: Diagnosis not present

## 2023-11-15 ENCOUNTER — Other Ambulatory Visit: Payer: Self-pay | Admitting: Hematology and Oncology

## 2023-11-16 ENCOUNTER — Other Ambulatory Visit: Payer: Self-pay | Admitting: *Deleted

## 2023-11-16 DIAGNOSIS — C50412 Malignant neoplasm of upper-outer quadrant of left female breast: Secondary | ICD-10-CM

## 2023-11-17 DIAGNOSIS — N2581 Secondary hyperparathyroidism of renal origin: Secondary | ICD-10-CM | POA: Diagnosis not present

## 2023-11-17 DIAGNOSIS — D509 Iron deficiency anemia, unspecified: Secondary | ICD-10-CM | POA: Diagnosis not present

## 2023-11-17 DIAGNOSIS — D689 Coagulation defect, unspecified: Secondary | ICD-10-CM | POA: Diagnosis not present

## 2023-11-17 DIAGNOSIS — Z992 Dependence on renal dialysis: Secondary | ICD-10-CM | POA: Diagnosis not present

## 2023-11-17 DIAGNOSIS — N186 End stage renal disease: Secondary | ICD-10-CM | POA: Diagnosis not present

## 2023-11-17 DIAGNOSIS — R197 Diarrhea, unspecified: Secondary | ICD-10-CM | POA: Diagnosis not present

## 2023-11-19 ENCOUNTER — Inpatient Hospital Stay: Payer: Medicare Other | Attending: Hematology and Oncology

## 2023-11-19 ENCOUNTER — Ambulatory Visit (HOSPITAL_COMMUNITY)
Admission: RE | Admit: 2023-11-19 | Discharge: 2023-11-19 | Disposition: A | Discharge status: Home / Self Care | Source: Ambulatory Visit | Attending: Hematology and Oncology | Admitting: Hematology and Oncology

## 2023-11-19 DIAGNOSIS — Z79899 Other long term (current) drug therapy: Secondary | ICD-10-CM | POA: Insufficient documentation

## 2023-11-19 DIAGNOSIS — K8020 Calculus of gallbladder without cholecystitis without obstruction: Secondary | ICD-10-CM | POA: Diagnosis not present

## 2023-11-19 DIAGNOSIS — I120 Hypertensive chronic kidney disease with stage 5 chronic kidney disease or end stage renal disease: Secondary | ICD-10-CM | POA: Insufficient documentation

## 2023-11-19 DIAGNOSIS — N186 End stage renal disease: Secondary | ICD-10-CM | POA: Diagnosis not present

## 2023-11-19 DIAGNOSIS — Z79811 Long term (current) use of aromatase inhibitors: Secondary | ICD-10-CM | POA: Insufficient documentation

## 2023-11-19 DIAGNOSIS — D631 Anemia in chronic kidney disease: Secondary | ICD-10-CM | POA: Diagnosis not present

## 2023-11-19 DIAGNOSIS — C50412 Malignant neoplasm of upper-outer quadrant of left female breast: Secondary | ICD-10-CM | POA: Insufficient documentation

## 2023-11-19 DIAGNOSIS — Z170 Estrogen receptor positive status [ER+]: Secondary | ICD-10-CM | POA: Insufficient documentation

## 2023-11-19 DIAGNOSIS — N951 Menopausal and female climacteric states: Secondary | ICD-10-CM | POA: Insufficient documentation

## 2023-11-19 DIAGNOSIS — I700 Atherosclerosis of aorta: Secondary | ICD-10-CM | POA: Diagnosis not present

## 2023-11-19 LAB — CMP (CANCER CENTER ONLY)
ALT: 11 U/L (ref 0–44)
AST: 14 U/L — ABNORMAL LOW (ref 15–41)
Albumin: 4.2 g/dL (ref 3.5–5.0)
Alkaline Phosphatase: 96 U/L (ref 38–126)
Anion gap: 11 (ref 5–15)
BUN: 55 mg/dL — ABNORMAL HIGH (ref 8–23)
CO2: 29 mmol/L (ref 22–32)
Calcium: 9.6 mg/dL (ref 8.9–10.3)
Chloride: 100 mmol/L (ref 98–111)
Creatinine: 5.78 mg/dL — ABNORMAL HIGH (ref 0.44–1.00)
GFR, Estimated: 7 mL/min — ABNORMAL LOW (ref >60)
Glucose, Bld: 90 mg/dL (ref 70–99)
Potassium: 4.9 mmol/L (ref 3.5–5.1)
Sodium: 140 mmol/L (ref 135–145)
Total Bilirubin: 0.4 mg/dL (ref 0.0–1.2)
Total Protein: 6.5 g/dL (ref 6.5–8.1)

## 2023-11-19 LAB — CBC WITH DIFFERENTIAL (CANCER CENTER ONLY)
Abs Immature Granulocytes: 0.07 K/uL (ref 0.00–0.07)
Basophils Absolute: 0 K/uL (ref 0.0–0.1)
Basophils Relative: 0 %
Eosinophils Absolute: 0.2 K/uL (ref 0.0–0.5)
Eosinophils Relative: 3 %
HCT: 31.4 % — ABNORMAL LOW (ref 36.0–46.0)
Hemoglobin: 10.9 g/dL — ABNORMAL LOW (ref 12.0–15.0)
Immature Granulocytes: 1 %
Lymphocytes Relative: 17 %
Lymphs Abs: 1.1 K/uL (ref 0.7–4.0)
MCH: 32.4 pg (ref 26.0–34.0)
MCHC: 34.7 g/dL (ref 30.0–36.0)
MCV: 93.5 fL (ref 80.0–100.0)
Monocytes Absolute: 0.6 K/uL (ref 0.1–1.0)
Monocytes Relative: 10 %
Neutro Abs: 4.5 K/uL (ref 1.7–7.7)
Neutrophils Relative %: 69 %
Platelet Count: 208 K/uL (ref 150–400)
RBC: 3.36 MIL/uL — ABNORMAL LOW (ref 3.87–5.11)
RDW: 14.6 % (ref 11.5–15.5)
WBC Count: 6.5 K/uL (ref 4.0–10.5)
nRBC: 0 % (ref 0.0–0.2)

## 2023-11-20 LAB — CANCER ANTIGEN 27.29: CA 27.29: 27.4 U/mL (ref 0.0–38.6)

## 2023-11-22 DIAGNOSIS — R197 Diarrhea, unspecified: Secondary | ICD-10-CM | POA: Diagnosis not present

## 2023-11-22 DIAGNOSIS — N186 End stage renal disease: Secondary | ICD-10-CM | POA: Diagnosis not present

## 2023-11-22 DIAGNOSIS — N2581 Secondary hyperparathyroidism of renal origin: Secondary | ICD-10-CM | POA: Diagnosis not present

## 2023-11-22 DIAGNOSIS — D509 Iron deficiency anemia, unspecified: Secondary | ICD-10-CM | POA: Diagnosis not present

## 2023-11-22 DIAGNOSIS — D631 Anemia in chronic kidney disease: Secondary | ICD-10-CM | POA: Diagnosis not present

## 2023-11-22 DIAGNOSIS — D689 Coagulation defect, unspecified: Secondary | ICD-10-CM | POA: Diagnosis not present

## 2023-11-26 ENCOUNTER — Inpatient Hospital Stay: Payer: Medicare Other | Admitting: Hematology and Oncology

## 2023-11-26 VITALS — BP 145/60 | HR 85 | Temp 98.1°F | Resp 18 | Ht 62.0 in | Wt 154.1 lb

## 2023-11-26 DIAGNOSIS — Z79899 Other long term (current) drug therapy: Secondary | ICD-10-CM | POA: Diagnosis not present

## 2023-11-26 DIAGNOSIS — I120 Hypertensive chronic kidney disease with stage 5 chronic kidney disease or end stage renal disease: Secondary | ICD-10-CM | POA: Diagnosis not present

## 2023-11-26 DIAGNOSIS — D631 Anemia in chronic kidney disease: Secondary | ICD-10-CM | POA: Diagnosis not present

## 2023-11-26 DIAGNOSIS — Z170 Estrogen receptor positive status [ER+]: Secondary | ICD-10-CM

## 2023-11-26 DIAGNOSIS — Z79811 Long term (current) use of aromatase inhibitors: Secondary | ICD-10-CM | POA: Diagnosis not present

## 2023-11-26 DIAGNOSIS — C50412 Malignant neoplasm of upper-outer quadrant of left female breast: Secondary | ICD-10-CM | POA: Diagnosis not present

## 2023-11-26 DIAGNOSIS — N186 End stage renal disease: Secondary | ICD-10-CM | POA: Diagnosis not present

## 2023-11-26 NOTE — Progress Notes (Signed)
 Patient Care Team: Ransom Other, MD as PCP - General (Internal Medicine) Wyn, Jackee VEAR Raddle., NP as PCP - Cardiology (Cardiology) Cleotilde Ronal RAMAN, MD as Consulting Physician (Gynecology) Glean Stephane BROCKS, RN (Inactive) as Oncology Nurse Navigator Tyree Nanetta SAILOR, RN as Oncology Nurse Navigator Odean Potts, MD as Consulting Physician (Hematology and Oncology) Norine Manuelita LABOR, MD as Attending Physician (Nephrology) Center[facility] D[facility] Mateo Larger, MD as Consulting Physician (Nephrology)  DIAGNOSIS:  Encounter Diagnosis  Name Primary?   Malignant neoplasm of upper-outer quadrant of left breast in female, estrogen receptor positive (HCC) Yes    SUMMARY OF ONCOLOGIC HISTORY: Oncology History  Malignant neoplasm of upper-outer quadrant of left breast in female, estrogen receptor positive (HCC)  08/02/2021 Initial Diagnosis   Screening mammogram detected left breast distortion measuring 1.1 cm by ultrasound, negative axillary lymph nodes.  Biopsy revealed grade 2 ILC ER 100%, PR 95%, Ki-67 5%, HER2 IHC 2+, negative ratio 1.29   08/29/2021 Cancer Staging   Staging form: Breast, AJCC 8th Edition - Clinical stage from 08/29/2021: Stage IA (cT1c, cN0, cM0, G2, ER+, PR+, HER2-) - Signed by Odean Potts, MD on 08/29/2021 Stage prefix: Initial diagnosis Histologic grading system: 3 grade system     CHIEF COMPLIANT: F/U of scans  HISTORY OF PRESENT ILLNESS:   History of Present Illness Rachel Villarreal is a 78 year old female with chronic kidney disease on dialysis who presents for follow-up on recent imaging and dialysis-related symptoms.  Recent imaging shows resolution of the left pleural effusion with no new lesions. The abdominal lymph node and a small bowel-adjacent nodule remain stable in size.  She continues anastrozole  therapy and experiences hot flashes, likely related to the medication.  During dialysis, she experiences nausea and fainting.  Zofran  was prescribed for nausea, but she has developed diarrhea. She takes Zofran  three times a week during dialysis. She also takes a phosphate binder, which causes constipation, and is considering adjusting her docusate intake.  Her hemoglobin levels range from 10.9 to 11.1. She experiences hypotension during dialysis, leading to dizziness, despite a baseline hypertensive blood pressure of approximately 140/90. Dialysis has been ongoing since September of the previous year.     ALLERGIES:  is allergic to ace inhibitors, lactose intolerance (gi), letrozole , lisinopril, mercury, and tape.  MEDICATIONS:  Current Outpatient Medications  Medication Sig Dispense Refill   acetaminophen  (TYLENOL ) 500 MG tablet Take 500 mg by mouth every 6 (six) hours as needed for mild pain (pain score 1-3).     anastrozole  (ARIMIDEX ) 1 MG tablet TAKE 1 TABLET BY MOUTH EVERY DAY 90 tablet 1   apixaban  (ELIQUIS ) 5 MG TABS tablet Take 1 tablet (5 mg total) by mouth 2 (two) times daily. 180 tablet 1   atorvastatin  (LIPITOR ) 80 MG tablet Take 1 tablet (80 mg total) by mouth in the morning. 90 tablet 3   budesonide  (RHINOCORT  AQUA) 32 MCG/ACT nasal spray Place 2 sprays into both nostrils in the morning.     Cholecalciferol 50 MCG (2000 UT) CAPS Take 2,000 Units by mouth in the morning.     diclofenac  Sodium (VOLTAREN ) 1 % GEL Apply 1 Application topically 4 (four) times daily as needed (pain.).     docusate sodium  (COLACE) 100 MG capsule Take 100 mg by mouth 2 (two) times daily.     eszopiclone (LUNESTA) 2 MG TABS tablet Take 2 mg by mouth at bedtime.     febuxostat  (ULORIC ) 40 MG tablet Take 40 mg by  mouth in the morning.     furosemide  (LASIX ) 80 MG tablet Take 80 mg by mouth daily as needed for fluid or edema.     loratadine  (CLARITIN ) 10 MG tablet Take 10 mg by mouth 2 (two) times daily.     Methoxy PEG-Epoetin  Beta (MIRCERA IJ) 60 mcg.     metoprolol  succinate (TOPROL -XL) 100 MG 24 hr tablet Take 0.5-1 tablet  (50-100 mg total) by mouth daily as needed for elevated heart rate. Take with or immediately following a meal. (Patient taking differently: Take 100 mg by mouth daily.) 90 tablet 3   Multiple Vitamins-Minerals (PRESERVISION AREDS 2+MULTI VIT PO) Take 1 capsule by mouth 2 (two) times daily.     pantoprazole  (PROTONIX ) 40 MG tablet Take 1 tablet (40 mg total) by mouth daily. 90 tablet 3   PROAIR  HFA 108 (90 BASE) MCG/ACT inhaler Inhale 1-2 puffs into the lungs every 6 (six) hours as needed for shortness of breath or wheezing (for respiratory issues.).     rOPINIRole  (REQUIP ) 0.5 MG tablet Take 1 tablet (0.5 mg total) by mouth every 12 (twelve) hours as needed (Restless legs). (Patient taking differently: Take 0.5 mg by mouth every 12 (twelve) hours as needed (Restless legs). Takes 1 tablet twice daily on dialysis days and 1 tablet once daily on the other days)     sevelamer  carbonate (RENVELA ) 800 MG tablet Take 800 mg by mouth. 2 tablets tid with meals     No current facility-administered medications for this visit.    PHYSICAL EXAMINATION: ECOG PERFORMANCE STATUS: 1 - Symptomatic but completely ambulatory  Vitals:   11/26/23 1044  BP: (!) 145/60  Pulse: 85  Resp: 18  Temp: 98.1 F (36.7 C)  SpO2: 97%   Filed Weights   11/26/23 1044  Weight: 154 lb 1.6 oz (69.9 kg)      LABORATORY DATA:  I have reviewed the data as listed    Latest Ref Rng & Units 11/19/2023   11:40 AM 10/23/2023    2:22 AM 05/10/2023    1:14 PM  CMP  Glucose 70 - 99 mg/dL 90  898  84   BUN 8 - 23 mg/dL 55  69  40   Creatinine 0.44 - 1.00 mg/dL 4.21  3.11  3.89   Sodium 135 - 145 mmol/L 140  138  141   Potassium 3.5 - 5.1 mmol/L 4.9  5.2  3.8   Chloride 98 - 111 mmol/L 100  105  97   CO2 22 - 32 mmol/L 29  21  24    Calcium  8.9 - 10.3 mg/dL 9.6  9.7  9.1   Total Protein 6.5 - 8.1 g/dL 6.5  6.2  5.5   Total Bilirubin 0.0 - 1.2 mg/dL 0.4  0.9  0.4   Alkaline Phos 38 - 126 U/L 96  88  137   AST 15 - 41 U/L 14   17  14    ALT 0 - 44 U/L 11  11  10      Lab Results  Component Value Date   WBC 6.5 11/19/2023   HGB 10.9 (L) 11/19/2023   HCT 31.4 (L) 11/19/2023   MCV 93.5 11/19/2023   PLT 208 11/19/2023   NEUTROABS 4.5 11/19/2023    ASSESSMENT & PLAN:  Malignant neoplasm of upper-outer quadrant of left breast in female, estrogen receptor positive (HCC) Left Lumpectomy: 1.1 cm Grade 1 ILC , Positive Posterior margin, ER 100%, PR 95%, Ki-67 5%, HER2 IHC 2+, negative ratio  1.29 Margin excision 09/29/2021: Benign  Left lower quadrant abdominal pain: CT abdomen 02/07/2022: Right lower quadrant mesenteric mass, 5 mm right lung base nodule   PET CT scan 02/10/2022: 2.7 cm mesenteric mass is hypermetabolic consistent with neoplastic process.  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  New bilateral small pleural effusions, 1.4 cm wedge-shaped opacity right lower lobe needs follow-up.  CT CAP 05/18/2023: Slightly increased left pleural effusion.  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 No new cancer-related findings. Stable lymph node and small nodule unchanged and asymptomatic. Anastrozole  effective. - Continue anastrozole  1 mg oral daily. - Schedule next scan in six months. - Discuss potential to reduce scan frequency to once a year after next scan.  End-stage renal disease on dialysis Dialysis proceeding well with nausea and fainting during sessions. Blood pressure drops during dialysis due to fluid removal but manageable. - Continue current dialysis regimen.  Anemia in chronic kidney disease Hemoglobin stable around 10.9 to 11.1, acceptable for chronic kidney disease.  Essential hypertension Blood pressure typically 140/90 mmHg, drops during dialysis but not concerning.  Hot flashes secondary to anastrozole  Experiencing hot flashes likely due to anastrozole . - Continue anastrozole  1 mg oral daily.  Diarrhea, possibly medication-related Experiencing diarrhea, potentially due to Zofran  or excessive docusate use. - Consider reducing docusate intake. - Continue Zofran  as prescribed during dialysis.      No orders of the defined types were placed in this encounter.  The patient has a good understanding of the overall plan. she agrees with it. she will call with any problems that may develop before the next visit here. Total time spent: 30 mins including face to face time and time spent for planning, charting and co-ordination of care   Viinay K Ansh Fauble, MD 11/26/23

## 2023-11-26 NOTE — Assessment & Plan Note (Signed)
 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  New bilateral small pleural effusions, 1.4 cm wedge-shaped opacity right lower lobe needs follow-up.  CT CAP 05/18/2023: Slightly increased left pleural effusion.  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

## 2023-11-27 DIAGNOSIS — R197 Diarrhea, unspecified: Secondary | ICD-10-CM | POA: Diagnosis not present

## 2023-11-27 DIAGNOSIS — N2581 Secondary hyperparathyroidism of renal origin: Secondary | ICD-10-CM | POA: Diagnosis not present

## 2023-11-27 DIAGNOSIS — D509 Iron deficiency anemia, unspecified: Secondary | ICD-10-CM | POA: Diagnosis not present

## 2023-11-27 DIAGNOSIS — D689 Coagulation defect, unspecified: Secondary | ICD-10-CM | POA: Diagnosis not present

## 2023-11-27 DIAGNOSIS — N186 End stage renal disease: Secondary | ICD-10-CM | POA: Diagnosis not present

## 2023-11-27 DIAGNOSIS — D631 Anemia in chronic kidney disease: Secondary | ICD-10-CM | POA: Diagnosis not present

## 2023-11-29 ENCOUNTER — Encounter (HOSPITAL_COMMUNITY): Payer: Self-pay | Admitting: Emergency Medicine

## 2023-11-29 ENCOUNTER — Emergency Department (HOSPITAL_COMMUNITY)
Admission: EM | Admit: 2023-11-29 | Discharge: 2023-11-29 | Disposition: A | Discharge status: Home / Self Care | Attending: Emergency Medicine | Admitting: Emergency Medicine

## 2023-11-29 ENCOUNTER — Other Ambulatory Visit: Payer: Self-pay

## 2023-11-29 DIAGNOSIS — I120 Hypertensive chronic kidney disease with stage 5 chronic kidney disease or end stage renal disease: Secondary | ICD-10-CM | POA: Diagnosis not present

## 2023-11-29 DIAGNOSIS — Z992 Dependence on renal dialysis: Secondary | ICD-10-CM | POA: Insufficient documentation

## 2023-11-29 DIAGNOSIS — N186 End stage renal disease: Secondary | ICD-10-CM | POA: Insufficient documentation

## 2023-11-29 DIAGNOSIS — I493 Ventricular premature depolarization: Secondary | ICD-10-CM | POA: Insufficient documentation

## 2023-11-29 DIAGNOSIS — R42 Dizziness and giddiness: Secondary | ICD-10-CM | POA: Diagnosis not present

## 2023-11-29 DIAGNOSIS — I4891 Unspecified atrial fibrillation: Secondary | ICD-10-CM | POA: Insufficient documentation

## 2023-11-29 DIAGNOSIS — Z743 Need for continuous supervision: Secondary | ICD-10-CM | POA: Diagnosis not present

## 2023-11-29 DIAGNOSIS — Z7901 Long term (current) use of anticoagulants: Secondary | ICD-10-CM | POA: Insufficient documentation

## 2023-11-29 DIAGNOSIS — R55 Syncope and collapse: Secondary | ICD-10-CM | POA: Insufficient documentation

## 2023-11-29 DIAGNOSIS — Z8679 Personal history of other diseases of the circulatory system: Secondary | ICD-10-CM

## 2023-11-29 DIAGNOSIS — I499 Cardiac arrhythmia, unspecified: Secondary | ICD-10-CM | POA: Diagnosis not present

## 2023-11-29 DIAGNOSIS — R110 Nausea: Secondary | ICD-10-CM

## 2023-11-29 LAB — CBC
HCT: 30 % — ABNORMAL LOW (ref 36.0–46.0)
Hemoglobin: 10.4 g/dL — ABNORMAL LOW (ref 12.0–15.0)
MCH: 31.8 pg (ref 26.0–34.0)
MCHC: 34.7 g/dL (ref 30.0–36.0)
MCV: 91.7 fL (ref 80.0–100.0)
Platelets: 228 K/uL (ref 150–400)
RBC: 3.27 MIL/uL — ABNORMAL LOW (ref 3.87–5.11)
RDW: 14.4 % (ref 11.5–15.5)
WBC: 10.7 K/uL — ABNORMAL HIGH (ref 4.0–10.5)
nRBC: 0 % (ref 0.0–0.2)

## 2023-11-29 LAB — COMPREHENSIVE METABOLIC PANEL WITH GFR
ALT: 14 U/L (ref 0–44)
AST: 16 U/L (ref 15–41)
Albumin: 3.1 g/dL — ABNORMAL LOW (ref 3.5–5.0)
Alkaline Phosphatase: 80 U/L (ref 38–126)
Anion gap: 13 (ref 5–15)
BUN: 17 mg/dL (ref 8–23)
CO2: 30 mmol/L (ref 22–32)
Calcium: 9.3 mg/dL (ref 8.9–10.3)
Chloride: 96 mmol/L — ABNORMAL LOW (ref 98–111)
Creatinine, Ser: 3.14 mg/dL — ABNORMAL HIGH (ref 0.44–1.00)
GFR, Estimated: 15 mL/min — ABNORMAL LOW (ref >60)
Glucose, Bld: 105 mg/dL — ABNORMAL HIGH (ref 70–99)
Potassium: 3.2 mmol/L — ABNORMAL LOW (ref 3.5–5.1)
Sodium: 139 mmol/L (ref 135–145)
Total Bilirubin: 0.5 mg/dL (ref 0.0–1.2)
Total Protein: 6.4 g/dL — ABNORMAL LOW (ref 6.5–8.1)

## 2023-11-29 NOTE — ED Provider Notes (Addendum)
 [place] EMERGENCY DEPARTMENT AT [facility] Provider Note   CSN: [id number] Arrival date & time: 11/29/23  1757     Patient presents with: Dizziness   Rachel Villarreal is a 78 y.o. female.   Pt with hx esrd/hd, had hd today, states after a couple hours of dialysis onset of nausea and generally feeling weak/faint - indicates this has happened several times before and has been prescribed zofran  for same. States was able to drive self home post dialysis but it took longer than normal for symptoms to improve.  No associated chest pain or discomfort, or sob or unusual doe. No palpitations. No syncope. Had eaten a small amount earlier/crackers, during dialysis. Hx afib, is on eliquis  - denies abnormal bruising or bleeding, no rectal bleeding or melena. Continues to make a small amount of urine at baseline - no dysuria, odor to urine or other gu c/o. Only change in meds is that previously was told to stop her lasix  but now is taking every other day. No fever or chills. No pain. No current faintness or nv.   The history is provided by the patient, medical records and the EMS personnel.  Dizziness Associated symptoms: nausea   Associated symptoms: no blood in stool, no chest pain, no diarrhea, no headaches, no palpitations, no shortness of breath and no vomiting        Prior to Admission medications   Medication Sig Start Date End Date Taking? Authorizing Provider  acetaminophen  (TYLENOL ) 500 MG tablet Take 500 mg by mouth every 6 (six) hours as needed for mild pain (pain score 1-3).    [provider]  anastrozole  (ARIMIDEX ) 1 MG tablet TAKE 1 TABLET BY MOUTH EVERY DAY 11/15/23   Odean Potts, MD  apixaban  (ELIQUIS ) 5 MG TABS tablet Take 1 tablet (5 mg total) by mouth 2 (two) times daily. 06/05/23   Santo Stanly LABOR, MD  atorvastatin  (LIPITOR ) 80 MG tablet Take 1 tablet (80 mg total) by mouth in the morning. 06/05/23   Chandrasekhar, Mahesh A, MD  budesonide  (RHINOCORT   AQUA) 32 MCG/ACT nasal spray Place 2 sprays into both nostrils in the morning. 12/12/22   [provider]  Cholecalciferol 50 MCG (2000 UT) CAPS Take 2,000 Units by mouth in the morning. 12/27/22   [provider]  diclofenac  Sodium (VOLTAREN ) 1 % GEL Apply 1 Application topically 4 (four) times daily as needed (pain.).    [provider]  docusate sodium  (COLACE) 100 MG capsule Take 100 mg by mouth 2 (two) times daily.    [provider]  eszopiclone (LUNESTA) 2 MG TABS tablet Take 2 mg by mouth at bedtime. 03/27/23   [provider]  febuxostat  (ULORIC ) 40 MG tablet Take 40 mg by mouth in the morning.    [provider]  furosemide  (LASIX ) 80 MG tablet Take 80 mg by mouth daily as needed for fluid or edema. 09/01/23   [provider]  loratadine  (CLARITIN ) 10 MG tablet Take 10 mg by mouth 2 (two) times daily. 12/12/22   [provider]  Methoxy PEG-Epoetin  Beta (MIRCERA IJ) 60 mcg. 08/07/23 08/05/24  [provider]  metoprolol  succinate (TOPROL -XL) 100 MG 24 hr tablet Take 0.5-1 tablet (50-100 mg total) by mouth daily as needed for elevated heart rate. Take with or immediately following a meal. Patient taking differently: Take 100 mg by mouth daily. 06/05/23   Santo Stanly LABOR, MD  Multiple Vitamins-Minerals (PRESERVISION AREDS 2+MULTI VIT PO) Take 1 capsule by  mouth 2 (two) times daily.    [provider]  pantoprazole  (PROTONIX ) 40 MG tablet Take 1 tablet (40 mg total) by mouth daily. 06/05/23   Santo Stanly LABOR, MD  PROAIR  HFA 108 (90 BASE) MCG/ACT inhaler Inhale 1-2 puffs into the lungs every 6 (six) hours as needed for shortness of breath or wheezing (for respiratory issues.). 12/03/14   [provider]  rOPINIRole  (REQUIP ) 0.5 MG tablet Take 1 tablet (0.5 mg total) by mouth every 12 (twelve) hours as needed (Restless legs). Patient taking differently: Take 0.5 mg by mouth every 12 (twelve)  hours as needed (Restless legs). Takes 1 tablet twice daily on dialysis days and 1 tablet once daily on the other days 04/25/23   Jillian Buttery, MD  sevelamer  carbonate (RENVELA ) 800 MG tablet Take 800 mg by mouth. 2 tablets tid with meals 03/15/23   [provider]    Allergies: Ace inhibitors, Lactose intolerance (gi), Letrozole , Lisinopril, Mercury, and Tape    Review of Systems  Constitutional:  Negative for chills, diaphoresis and fever.  HENT:  Negative for sore throat.   Eyes:  Negative for visual disturbance.  Respiratory:  Negative for cough and shortness of breath.   Cardiovascular:  Negative for chest pain, palpitations and leg swelling.  Gastrointestinal:  Positive for nausea. Negative for abdominal pain, blood in stool, diarrhea and vomiting.  Genitourinary:  Negative for dysuria and flank pain.  Musculoskeletal:  Negative for back pain and neck pain.  Neurological:  Positive for dizziness and light-headedness. Negative for syncope, speech difficulty and headaches.    Updated Vital Signs BP (!) 152/70 (BP Location: Left Wrist)   Pulse 91   Temp 98.3 F (36.8 C) (Oral)   Resp 16   Ht 1.575 m (5' 2)   Wt 68 kg   SpO2 97%   BMI 27.44 kg/m   Physical Exam Vitals and nursing note reviewed.  Constitutional:      Appearance: Normal appearance. She is well-developed.  HENT:     Head: Atraumatic.     Nose: Nose normal.     Mouth/Throat:     Mouth: Mucous membranes are moist.  Eyes:     General: No scleral icterus.    Conjunctiva/sclera: Conjunctivae normal.     Pupils: Pupils are equal, round, and reactive to light.  Neck:     Trachea: No tracheal deviation.     Comments: Trachea midline, thyroid  not grossly enlarged or tender. No neck stiffness or rigidity.  Cardiovascular:     Rate and Rhythm: Normal rate and regular rhythm.     Pulses: Normal pulses.     Heart sounds: Normal heart sounds. No murmur heard.    No friction rub. No gallop.  Pulmonary:      Effort: Pulmonary effort is normal. No respiratory distress.     Breath sounds: Normal breath sounds.  Abdominal:     General: Bowel sounds are normal. There is no distension.     Palpations: Abdomen is soft. There is no mass.     Tenderness: There is no abdominal tenderness. There is no guarding.  Genitourinary:    Comments: No cva tenderness.  Musculoskeletal:        General: No swelling or tenderness.     Cervical back: Normal range of motion and neck supple. No rigidity. No muscular tenderness.     Right lower leg: No edema.     Left lower leg: No edema.     Comments: RUE av fistula  with palp thrill.   Skin:    General: Skin is warm and dry.     Findings: No rash.  Neurological:     Mental Status: She is alert.     Comments: Alert, speech normal. Motor/sens grossly intact bil.   Psychiatric:        Mood and Affect: Mood normal.     (all labs ordered are listed, but only abnormal results are displayed) Results for orders placed or performed during the hospital encounter of 11/29/23  CBC   Collection Time: 11/29/23  8:38 PM  Result Value Ref Range   WBC 10.7 (H) 4.0 - 10.5 K/uL   RBC 3.27 (L) 3.87 - 5.11 MIL/uL   Hemoglobin 10.4 (L) 12.0 - 15.0 g/dL   HCT 69.9 (L) 63.9 - 53.9 %   MCV 91.7 80.0 - 100.0 fL   MCH 31.8 26.0 - 34.0 pg   MCHC 34.7 30.0 - 36.0 g/dL   RDW 85.5 88.4 - 84.4 %   Platelets 228 150 - 400 K/uL   nRBC 0.0 0.0 - 0.2 %  Comprehensive metabolic panel   Collection Time: 11/29/23  8:38 PM  Result Value Ref Range   Sodium 139 135 - 145 mmol/L   Potassium 3.2 (L) 3.5 - 5.1 mmol/L   Chloride 96 (L) 98 - 111 mmol/L   CO2 30 22 - 32 mmol/L   Glucose, Bld 105 (H) 70 - 99 mg/dL   BUN 17 8 - 23 mg/dL   Creatinine, Ser 6.85 (H) 0.44 - 1.00 mg/dL   Calcium  9.3 8.9 - 10.3 mg/dL   Total Protein 6.4 (L) 6.5 - 8.1 g/dL   Albumin 3.1 (L) 3.5 - 5.0 g/dL   AST 16 15 - 41 U/L   ALT 14 0 - 44 U/L   Alkaline Phosphatase 80 38 - 126 U/L   Total Bilirubin 0.5  0.0 - 1.2 mg/dL   GFR, Estimated 15 (L) >60 mL/min   Anion gap 13 5 - 15     EKG: EKG Interpretation Date/Time:  Thursday November 29 2023 18:07:52 EDT Ventricular Rate:  95 PR Interval:  154 QRS Duration:  86 QT Interval:  370 QTC Calculation: 464 R Axis:   78  Text Interpretation: Sinus rhythm with frequent Premature ventricular complexes Non-specific ST-t changes Confirmed by Bernard Drivers ([id number]) on 11/29/2023 6:36:50 PM  Radiology: No results found.   Procedures   Medications Ordered in the ED - No data to display                                  Medical Decision Making Problems Addressed: ESRD on dialysis [facility]): chronic illness or injury with exacerbation, progression, or side effects of treatment that poses a threat to life or bodily functions History of atrial fibrillation: chronic illness or injury Lightheadedness: acute illness or injury with systemic symptoms that poses a threat to life or bodily functions Nausea: acute illness or injury with systemic symptoms Near syncope: acute illness or injury with systemic symptoms that poses a threat to life or bodily functions PVC's (premature ventricular contractions): acute illness or injury  Amount and/or Complexity of Data Reviewed Independent Historian: EMS    Details: hx External Data Reviewed: notes. Labs: ordered. Decision-making details documented in ED Course. ECG/medicine tests: ordered and independent interpretation performed. Decision-making details documented in ED Course.  Risk Decision regarding hospitalization.  Iv ns. Continuous pulse ox and cardiac monitoring.  Labs ordered/sent.   Differential diagnosis includes hypovolemia, adverse symptoms/effect of dialysis, anemia, dehydration, etc. Dispo decision including potential need for admission considered - will get labs and reassess.   Reviewed nursing notes and prior charts for additional history. External reports reviewed. Additional history from:  EMS.   Cardiac monitor: sinus rhythm, rate 88.  Labs reviewed/interpreted by me - wbc 10, hct 30. Chem w ckd/esrd, k is not high.   Recheck pt, vitals stable. Nsr. No current symptoms, pt indicates feels fine, c/w baseline. No nausea, no faintness or weakness. No pain.   Pt asymptomatic and appears stable for ED d/c.   Rec close pcp/cardiology f/u.  Return precautions provided.       Final diagnoses:  Nausea  Lightheadedness  ESRD on dialysis [facility])  PVC's (premature ventricular contractions)    ED Discharge Orders     None           Bernard Drivers, MD 11/29/23 2204

## 2023-11-29 NOTE — Discharge Instructions (Addendum)
 It was our pleasure to provide your ER care today - we hope that you feel better. Drink adequate fluids/stay well hydrated.   For recent symptoms, follow up closely with your primary care doctor and kidney doctor to discuss possible adjustment in medications and/or adjustment of dialysis to help minimize nausea or lightheadedness/faintness that you experience with your dialysis.  For your feeling faint, history of a-fib, and frequent pvcs, we did refer you to close cardiology follow up and possible additional monitoring, and optimization of med management - they should contact you with an appointment within the next few days.   Return to ER if worse, new symptoms, fevers, new/severe pain, fainting, chest pain, trouble breathing, persistent fast heart beating, or other concern.

## 2023-11-29 NOTE — ED Triage Notes (Signed)
 Pt to ER via EMS form home.  States upon returning home from dialysis became dizzy when getting out of the car.  States this has been happening for several weeks since an increase in her lasix , but was the worst today.  States it took approximately an hour to return to what she would say is normal.

## 2023-12-01 DIAGNOSIS — N2581 Secondary hyperparathyroidism of renal origin: Secondary | ICD-10-CM | POA: Diagnosis not present

## 2023-12-01 DIAGNOSIS — D689 Coagulation defect, unspecified: Secondary | ICD-10-CM | POA: Diagnosis not present

## 2023-12-01 DIAGNOSIS — D631 Anemia in chronic kidney disease: Secondary | ICD-10-CM | POA: Diagnosis not present

## 2023-12-01 DIAGNOSIS — N186 End stage renal disease: Secondary | ICD-10-CM | POA: Diagnosis not present

## 2023-12-01 DIAGNOSIS — D509 Iron deficiency anemia, unspecified: Secondary | ICD-10-CM | POA: Diagnosis not present

## 2023-12-01 DIAGNOSIS — Z992 Dependence on renal dialysis: Secondary | ICD-10-CM | POA: Diagnosis not present

## 2023-12-01 DIAGNOSIS — R197 Diarrhea, unspecified: Secondary | ICD-10-CM | POA: Diagnosis not present

## 2023-12-04 DIAGNOSIS — D689 Coagulation defect, unspecified: Secondary | ICD-10-CM | POA: Diagnosis not present

## 2023-12-04 DIAGNOSIS — R197 Diarrhea, unspecified: Secondary | ICD-10-CM | POA: Diagnosis not present

## 2023-12-04 DIAGNOSIS — D631 Anemia in chronic kidney disease: Secondary | ICD-10-CM | POA: Diagnosis not present

## 2023-12-04 DIAGNOSIS — Z992 Dependence on renal dialysis: Secondary | ICD-10-CM | POA: Diagnosis not present

## 2023-12-04 DIAGNOSIS — N2581 Secondary hyperparathyroidism of renal origin: Secondary | ICD-10-CM | POA: Diagnosis not present

## 2023-12-04 DIAGNOSIS — D509 Iron deficiency anemia, unspecified: Secondary | ICD-10-CM | POA: Diagnosis not present

## 2023-12-04 DIAGNOSIS — N186 End stage renal disease: Secondary | ICD-10-CM | POA: Diagnosis not present

## 2023-12-05 DIAGNOSIS — K439 Ventral hernia without obstruction or gangrene: Secondary | ICD-10-CM | POA: Diagnosis not present

## 2023-12-05 DIAGNOSIS — R55 Syncope and collapse: Secondary | ICD-10-CM | POA: Diagnosis not present

## 2023-12-05 DIAGNOSIS — K429 Umbilical hernia without obstruction or gangrene: Secondary | ICD-10-CM | POA: Diagnosis not present

## 2023-12-06 ENCOUNTER — Observation Stay (HOSPITAL_COMMUNITY)

## 2023-12-06 ENCOUNTER — Emergency Department (HOSPITAL_COMMUNITY)

## 2023-12-06 ENCOUNTER — Observation Stay (HOSPITAL_COMMUNITY)
Admission: EM | Admit: 2023-12-06 | Discharge: 2023-12-08 | Disposition: A | Discharge status: Home / Self Care | Attending: Internal Medicine | Admitting: Internal Medicine

## 2023-12-06 ENCOUNTER — Other Ambulatory Visit: Payer: Self-pay

## 2023-12-06 DIAGNOSIS — D509 Iron deficiency anemia, unspecified: Secondary | ICD-10-CM | POA: Diagnosis not present

## 2023-12-06 DIAGNOSIS — Z79899 Other long term (current) drug therapy: Secondary | ICD-10-CM | POA: Diagnosis not present

## 2023-12-06 DIAGNOSIS — S0101XA Laceration without foreign body of scalp, initial encounter: Secondary | ICD-10-CM | POA: Diagnosis not present

## 2023-12-06 DIAGNOSIS — I120 Hypertensive chronic kidney disease with stage 5 chronic kidney disease or end stage renal disease: Secondary | ICD-10-CM | POA: Insufficient documentation

## 2023-12-06 DIAGNOSIS — D689 Coagulation defect, unspecified: Secondary | ICD-10-CM | POA: Diagnosis not present

## 2023-12-06 DIAGNOSIS — S0990XA Unspecified injury of head, initial encounter: Secondary | ICD-10-CM | POA: Diagnosis not present

## 2023-12-06 DIAGNOSIS — I6523 Occlusion and stenosis of bilateral carotid arteries: Secondary | ICD-10-CM | POA: Diagnosis not present

## 2023-12-06 DIAGNOSIS — N189 Chronic kidney disease, unspecified: Secondary | ICD-10-CM | POA: Diagnosis present

## 2023-12-06 DIAGNOSIS — D631 Anemia in chronic kidney disease: Secondary | ICD-10-CM | POA: Diagnosis present

## 2023-12-06 DIAGNOSIS — I480 Paroxysmal atrial fibrillation: Secondary | ICD-10-CM | POA: Diagnosis not present

## 2023-12-06 DIAGNOSIS — E7800 Pure hypercholesterolemia, unspecified: Secondary | ICD-10-CM | POA: Diagnosis not present

## 2023-12-06 DIAGNOSIS — R918 Other nonspecific abnormal finding of lung field: Secondary | ICD-10-CM | POA: Diagnosis not present

## 2023-12-06 DIAGNOSIS — W19XXXA Unspecified fall, initial encounter: Secondary | ICD-10-CM | POA: Insufficient documentation

## 2023-12-06 DIAGNOSIS — R0989 Other specified symptoms and signs involving the circulatory and respiratory systems: Secondary | ICD-10-CM | POA: Diagnosis not present

## 2023-12-06 DIAGNOSIS — Z87891 Personal history of nicotine dependence: Secondary | ICD-10-CM | POA: Insufficient documentation

## 2023-12-06 DIAGNOSIS — Z743 Need for continuous supervision: Secondary | ICD-10-CM | POA: Diagnosis not present

## 2023-12-06 DIAGNOSIS — R9089 Other abnormal findings on diagnostic imaging of central nervous system: Secondary | ICD-10-CM | POA: Diagnosis not present

## 2023-12-06 DIAGNOSIS — R930 Abnormal findings on diagnostic imaging of skull and head, not elsewhere classified: Secondary | ICD-10-CM | POA: Diagnosis not present

## 2023-12-06 DIAGNOSIS — N2581 Secondary hyperparathyroidism of renal origin: Secondary | ICD-10-CM | POA: Diagnosis not present

## 2023-12-06 DIAGNOSIS — I6782 Cerebral ischemia: Secondary | ICD-10-CM | POA: Diagnosis not present

## 2023-12-06 DIAGNOSIS — I609 Nontraumatic subarachnoid hemorrhage, unspecified: Principal | ICD-10-CM

## 2023-12-06 DIAGNOSIS — I2510 Atherosclerotic heart disease of native coronary artery without angina pectoris: Secondary | ICD-10-CM | POA: Diagnosis present

## 2023-12-06 DIAGNOSIS — Z7901 Long term (current) use of anticoagulants: Secondary | ICD-10-CM | POA: Insufficient documentation

## 2023-12-06 DIAGNOSIS — Z853 Personal history of malignant neoplasm of breast: Secondary | ICD-10-CM | POA: Diagnosis not present

## 2023-12-06 DIAGNOSIS — R197 Diarrhea, unspecified: Secondary | ICD-10-CM | POA: Diagnosis not present

## 2023-12-06 DIAGNOSIS — S0003XA Contusion of scalp, initial encounter: Secondary | ICD-10-CM | POA: Diagnosis not present

## 2023-12-06 DIAGNOSIS — R42 Dizziness and giddiness: Secondary | ICD-10-CM | POA: Insufficient documentation

## 2023-12-06 DIAGNOSIS — G319 Degenerative disease of nervous system, unspecified: Secondary | ICD-10-CM | POA: Diagnosis not present

## 2023-12-06 DIAGNOSIS — Z992 Dependence on renal dialysis: Secondary | ICD-10-CM | POA: Diagnosis not present

## 2023-12-06 DIAGNOSIS — Y92000 Kitchen of unspecified non-institutional (private) residence as  the place of occurrence of the external cause: Secondary | ICD-10-CM | POA: Diagnosis not present

## 2023-12-06 DIAGNOSIS — J45909 Unspecified asthma, uncomplicated: Secondary | ICD-10-CM | POA: Diagnosis not present

## 2023-12-06 DIAGNOSIS — I639 Cerebral infarction, unspecified: Secondary | ICD-10-CM | POA: Diagnosis present

## 2023-12-06 DIAGNOSIS — R55 Syncope and collapse: Secondary | ICD-10-CM | POA: Diagnosis not present

## 2023-12-06 DIAGNOSIS — R404 Transient alteration of awareness: Secondary | ICD-10-CM | POA: Diagnosis not present

## 2023-12-06 DIAGNOSIS — E876 Hypokalemia: Secondary | ICD-10-CM | POA: Diagnosis not present

## 2023-12-06 DIAGNOSIS — R9389 Abnormal findings on diagnostic imaging of other specified body structures: Secondary | ICD-10-CM | POA: Diagnosis not present

## 2023-12-06 DIAGNOSIS — N186 End stage renal disease: Secondary | ICD-10-CM | POA: Insufficient documentation

## 2023-12-06 DIAGNOSIS — S199XXA Unspecified injury of neck, initial encounter: Secondary | ICD-10-CM | POA: Diagnosis not present

## 2023-12-06 DIAGNOSIS — S066X0A Traumatic subarachnoid hemorrhage without loss of consciousness, initial encounter: Secondary | ICD-10-CM | POA: Diagnosis not present

## 2023-12-06 DIAGNOSIS — S066XAA Traumatic subarachnoid hemorrhage with loss of consciousness status unknown, initial encounter: Secondary | ICD-10-CM | POA: Diagnosis not present

## 2023-12-06 LAB — CBC WITH DIFFERENTIAL/PLATELET
Abs Immature Granulocytes: 0.36 K/uL — ABNORMAL HIGH (ref 0.00–0.07)
Basophils Absolute: 0 K/uL (ref 0.0–0.1)
Basophils Relative: 0 %
Eosinophils Absolute: 0 K/uL (ref 0.0–0.5)
Eosinophils Relative: 0 %
HCT: 30 % — ABNORMAL LOW (ref 36.0–46.0)
Hemoglobin: 10 g/dL — ABNORMAL LOW (ref 12.0–15.0)
Immature Granulocytes: 3 %
Lymphocytes Relative: 4 %
Lymphs Abs: 0.6 K/uL — ABNORMAL LOW (ref 0.7–4.0)
MCH: 31.3 pg (ref 26.0–34.0)
MCHC: 33.3 g/dL (ref 30.0–36.0)
MCV: 93.8 fL (ref 80.0–100.0)
Monocytes Absolute: 1 K/uL (ref 0.1–1.0)
Monocytes Relative: 7 %
Neutro Abs: 12 K/uL — ABNORMAL HIGH (ref 1.7–7.7)
Neutrophils Relative %: 86 %
Platelets: 266 K/uL (ref 150–400)
RBC: 3.2 MIL/uL — ABNORMAL LOW (ref 3.87–5.11)
RDW: 15 % (ref 11.5–15.5)
WBC: 14.1 K/uL — ABNORMAL HIGH (ref 4.0–10.5)
nRBC: 0 % (ref 0.0–0.2)

## 2023-12-06 LAB — COMPREHENSIVE METABOLIC PANEL WITH GFR
ALT: 14 U/L (ref 0–44)
AST: 20 U/L (ref 15–41)
Albumin: 2.8 g/dL — ABNORMAL LOW (ref 3.5–5.0)
Alkaline Phosphatase: 83 U/L (ref 38–126)
Anion gap: 15 (ref 5–15)
BUN: 12 mg/dL (ref 8–23)
CO2: 28 mmol/L (ref 22–32)
Calcium: 9.4 mg/dL (ref 8.9–10.3)
Chloride: 96 mmol/L — ABNORMAL LOW (ref 98–111)
Creatinine, Ser: 2.92 mg/dL — ABNORMAL HIGH (ref 0.44–1.00)
GFR, Estimated: 16 mL/min — ABNORMAL LOW (ref >60)
Glucose, Bld: 109 mg/dL — ABNORMAL HIGH (ref 70–99)
Potassium: 3.2 mmol/L — ABNORMAL LOW (ref 3.5–5.1)
Sodium: 139 mmol/L (ref 135–145)
Total Bilirubin: 0.7 mg/dL (ref 0.0–1.2)
Total Protein: 6.4 g/dL — ABNORMAL LOW (ref 6.5–8.1)

## 2023-12-06 LAB — CBG MONITORING, ED: Glucose-Capillary: 97 mg/dL (ref 70–99)

## 2023-12-06 MED ORDER — METOPROLOL SUCCINATE ER 50 MG PO TB24
50.0000 mg | ORAL_TABLET | Freq: Every evening | ORAL | Status: DC
  Administered 2023-12-07: 50 mg via ORAL
  Filled 2023-12-06: qty 1

## 2023-12-06 MED ORDER — POTASSIUM CHLORIDE 20 MEQ PO PACK
20.0000 meq | PACK | Freq: Once | ORAL | Status: AC
  Administered 2023-12-07: 20 meq via ORAL
  Filled 2023-12-06: qty 1

## 2023-12-06 MED ORDER — ANASTROZOLE 1 MG PO TABS
1.0000 mg | ORAL_TABLET | Freq: Every day | ORAL | Status: DC
  Administered 2023-12-07 – 2023-12-08 (×2): 1 mg via ORAL
  Filled 2023-12-06 (×2): qty 1

## 2023-12-06 MED ORDER — ROPINIROLE HCL 1 MG PO TABS
0.5000 mg | ORAL_TABLET | Freq: Two times a day (BID) | ORAL | Status: DC
  Administered 2023-12-07 – 2023-12-08 (×3): 0.5 mg via ORAL
  Filled 2023-12-06 (×3): qty 1

## 2023-12-06 MED ORDER — ACETAMINOPHEN 325 MG PO TABS
650.0000 mg | ORAL_TABLET | ORAL | Status: DC | PRN
  Administered 2023-12-07 (×2): 650 mg via ORAL
  Filled 2023-12-06 (×2): qty 2

## 2023-12-06 MED ORDER — SEVELAMER CARBONATE 800 MG PO TABS
1600.0000 mg | ORAL_TABLET | Freq: Three times a day (TID) | ORAL | Status: DC
  Administered 2023-12-07 (×3): 1600 mg via ORAL
  Filled 2023-12-06 (×4): qty 2

## 2023-12-06 MED ORDER — LIDOCAINE-EPINEPHRINE-TETRACAINE (LET) TOPICAL GEL
3.0000 mL | Freq: Once | TOPICAL | Status: AC
  Administered 2023-12-06: 3 mL via TOPICAL
  Filled 2023-12-06: qty 3

## 2023-12-06 MED ORDER — FEBUXOSTAT 40 MG PO TABS
40.0000 mg | ORAL_TABLET | Freq: Every day | ORAL | Status: DC
  Administered 2023-12-07 – 2023-12-08 (×2): 40 mg via ORAL
  Filled 2023-12-06 (×2): qty 1

## 2023-12-06 MED ORDER — ONDANSETRON HCL 4 MG/2ML IJ SOLN: 4.0000 mg | Freq: Four times a day (QID) | INTRAMUSCULAR | Status: DC | PRN

## 2023-12-06 MED ORDER — ATORVASTATIN CALCIUM 80 MG PO TABS
80.0000 mg | ORAL_TABLET | Freq: Every morning | ORAL | Status: DC
  Administered 2023-12-07 – 2023-12-08 (×2): 80 mg via ORAL
  Filled 2023-12-06 (×2): qty 1

## 2023-12-06 MED ORDER — LIDOCAINE-EPINEPHRINE (PF) 2 %-1:200000 IJ SOLN
10.0000 mL | Freq: Once | INTRAMUSCULAR | Status: AC
  Administered 2023-12-06: 10 mL
  Filled 2023-12-06: qty 20

## 2023-12-06 MED ORDER — SENNOSIDES-DOCUSATE SODIUM 8.6-50 MG PO TABS: 1.0000 | ORAL_TABLET | Freq: Every evening | ORAL | Status: DC | PRN

## 2023-12-06 MED ORDER — LORAZEPAM 1 MG PO TABS
1.0000 mg | ORAL_TABLET | Freq: Once | ORAL | Status: AC
  Administered 2023-12-06: 1 mg via ORAL
  Filled 2023-12-06: qty 1

## 2023-12-06 MED ORDER — ACETAMINOPHEN 650 MG RE SUPP: 650.0000 mg | RECTAL | Status: DC | PRN

## 2023-12-06 MED ORDER — ACETAMINOPHEN 160 MG/5ML PO SOLN: 650.0000 mg | ORAL | Status: DC | PRN

## 2023-12-06 MED ORDER — PANTOPRAZOLE SODIUM 40 MG PO TBEC
40.0000 mg | DELAYED_RELEASE_TABLET | Freq: Every day | ORAL | Status: DC
  Administered 2023-12-07 – 2023-12-08 (×2): 40 mg via ORAL
  Filled 2023-12-06 (×2): qty 1

## 2023-12-06 MED ORDER — STROKE: EARLY STAGES OF RECOVERY BOOK
Freq: Once | Status: AC
  Filled 2023-12-06: qty 1

## 2023-12-06 MED ORDER — ALBUTEROL SULFATE (2.5 MG/3ML) 0.083% IN NEBU: 3.0000 mL | INHALATION_SOLUTION | Freq: Four times a day (QID) | RESPIRATORY_TRACT | Status: DC | PRN

## 2023-12-06 MED ORDER — SODIUM CHLORIDE 0.9 % IV BOLUS
500.0000 mL | Freq: Once | INTRAVENOUS | Status: AC
  Administered 2023-12-06: 500 mL via INTRAVENOUS

## 2023-12-06 NOTE — H&P (Signed)
 History and Physical    DATRA CLARY FMW:[id number] DOB: 11/11/1945 DOA: 12/06/2023  PCP: Ransom Other, MD  Patient coming from: Home  I have personally briefly reviewed patient's old medical records in [facility]  Chief Complaint: Fall with head injury  HPI: Rachel Villarreal is a 78 y.o. 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  Patient states that she has been having issues regarding near syncopal events and dizziness after recent dialysis sessions.  She says she has seen her blood pressure get low after dialysis.  She was seen in the ED 1 week ago on 8/21 for feeling faint and nauseous after her dialysis session.  She was asymptomatic by the time she was seen in the ED and discharged to home.  Patient states after her dialysis session today she was walking at home and felt off balance.  She fell backwards and hit the back of her head on the side of the fridge.  She says she did not lose consciousness.  She came to the ED for further evaluation.  Patient states she does make a small amount of urine when she takes Lasix  on nondialysis days.  She denies chest pain, dyspnea, lower extremity swelling.  ED Course  Labs/Imaging on admission: I have personally reviewed following labs and imaging studies.  Initial showed BP 155/103, pulse 94, RR 18, temp 98.8 F, SpO2 97% on room air.  Labs showed sodium 139, potassium 3.2, bicarb 28, BUN 12, creatinine 2.92, serum glucose 109, LFTs within normal limits, WBC 14.1, hemoglobin 10.0, platelets 2066.  CT head without contrast showed a 5 mm curvilinear hyperdensity along the left sylvian fissure which may represent a tiny subarachnoid hemorrhage.  CT cervical spine without contrast negative for acute displaced fracture or traumatic listhesis.  Portable chest x-ray  showed bilateral perihilar interstitial opacities and patchy opacities in the retrocardiac left lung base.  MRI brain without contrast IMPRESSION: 1. Single punctate focus of diffusion signal abnormality involving the right occipital cortex, suspicious for a tiny acute ischemic nonhemorrhagic infarct. 2. No other acute intracranial abnormality. 3. Underlying age-related cerebral atrophy with moderate chronic microvascular ischemic disease, with a few scattered remote lacunar infarcts as above. 4. Small soft tissue contusion at the left occipital scalp.  Patient was given 500 cc normal saline bolus.  EDP spoke with neurosurgery (Dr. Colon) who recommended repeat CT head in 12 hours and if no change no further intervention recommended from their perspective.  EDP also spoke with neurology (Dr. Vanessa) who recommended obtaining MRA head and neck without contrast to assess for significant stenosis or occlusion.  Patient not a candidate for antiplatelets due to [facility].  The hospitalist service was consulted for admission.  Review of Systems: All systems reviewed and are negative except as documented in history of present illness above.   Past Medical History:  Diagnosis Date   Abnormal Pap smear of vagina    Anemia    Asthma    Mild   Cancer (HCC)    left breast ILC   Chronic kidney disease (CKD)    stage 4   GERD (gastroesophageal reflux disease)    Gout 05/2012   Hernia, incisional    History of abnormal Pap smear    CIN III, 2001   History of blood transfusion  POST OP   History of uterine fibroid    Hypertension    Peritonitis (HCC)    Respiratory failure (HCC)    after ruptured appendix   Ruptured appendix    Seasonal allergies    Shingles 02/2013   very mild case   Urine incontinence     Past Surgical History:  Procedure Laterality Date   A/V FISTULAGRAM Right 03/19/2023   Procedure: A/V Fistulagram;  Surgeon: Sheree Penne Bruckner, MD;  Location: [facility] INVASIVE  CV LAB;  Service: Cardiovascular;  Laterality: Right;   APPENDECTOMY     AV FISTULA PLACEMENT Right 04/24/2022   Procedure: RIGHT BRACHIOCEPHALIC ARTERIOVENOUS (AV) FISTULA CREATION;  Surgeon: Eliza Bruckner RAMAN, MD;  Location: [facility] OR;  Service: Vascular;  Laterality: Right;   BREAST BIOPSY Right    fibrocystic changes   BREAST BIOPSY Left 08/02/2021   BREAST LUMPECTOMY WITH RADIOACTIVE SEED LOCALIZATION Left 09/12/2021   Procedure: LEFT BREAST LUMPECTOMY WITH RADIOACTIVE SEED LOCALIZATION;  Surgeon: Ebbie Cough, MD;  Location: [facility];  Service: General;  Laterality: Left;   CATARACT EXTRACTION Bilateral    COLONOSCOPY     COLONOSCOPY WITH PROPOFOL  N/A 02/18/2018   Procedure: COLONOSCOPY WITH PROPOFOL ;  Surgeon: Rosalie Kitchens, MD;  Location: [facility] ENDOSCOPY;  Service: Endoscopy;  Laterality: N/A;  Use ultraslim scope    FISTULA SUPERFICIALIZATION Right 04/13/2023   Procedure: FISTULA SUPERFICIALIZATION;  Surgeon: Sheree Penne Bruckner, MD;  Location: [facility] OR;  Service: Vascular;  Laterality: Right;   IR FLUORO GUIDE CV LINE RIGHT  03/10/2023   LEEP  2000   CIN 2/3   RE-EXCISION OF BREAST LUMPECTOMY Left 09/29/2021   Procedure: LEFT  BREAST RE-EXCISION LUMPECTOMY;  Surgeon: Ebbie Cough, MD;  Location: [facility] ORS;  Service: General;  Laterality: Left;   REFRACTIVE SURGERY     RIGHT/LEFT HEART CATH AND CORONARY ANGIOGRAPHY N/A 04/23/2023   Procedure: RIGHT/LEFT HEART CATH AND CORONARY ANGIOGRAPHY;  Surgeon: Swazil[place], Peter M, MD;  Location: [facility] INVASIVE CV LAB;  Service: Cardiovascular;  Laterality: N/A;   SUPRACERVICAL ABDOMINAL HYSTERECTOMY  2001    Social History: Social History   Tobacco Use   Smoking status: Former    Current packs/day: 0.00    Types: Cigarettes    Quit date: 04/10/1972    Years since quitting: 51.6   Smokeless tobacco: Never  Vaping Use   Vaping status: Never Used  Substance Use Topics   Alcohol use: No   Drug use: No   Allergies   Allergen Reactions   Ace Inhibitors     angioedema   Lactose Intolerance (Gi) Other (See Comments)    GI upset   Letrozole  Hives   Lisinopril Swelling   Mercury     Red eyes, through eye drops that contained thimerosal    Tape Dermatitis    plastic    Family History  Problem Relation Age of Onset   Colon cancer Father    Breast cancer Neg Hx      Prior to Admission medications   Medication Sig Start Date End Date Taking? Authorizing Provider  acetaminophen  (TYLENOL ) 500 MG tablet Take 500 mg by mouth every 6 (six) hours as needed for mild pain (pain score 1-3).    [provider]  anastrozole  (ARIMIDEX ) 1 MG tablet TAKE 1 TABLET BY MOUTH EVERY DAY 11/15/23   Odean Potts, MD  apixaban  (ELIQUIS ) 5 MG TABS tablet Take 1 tablet (5 mg total) by mouth 2 (two) times daily. 06/05/23   Santo Stanly LABOR, MD  atorvastatin  (LIPITOR ) 80 MG tablet Take 1 tablet (80 mg total) by mouth in the morning. 06/05/23   Chandrasekhar, Mahesh A, MD  budesonide  (RHINOCORT  AQUA) 32 MCG/ACT nasal spray Place 2 sprays into both nostrils in the morning. 12/12/22   [provider]  Cholecalciferol 50 MCG (2000 UT) CAPS Take 2,000 Units by mouth in the morning. 12/27/22   [provider]  diclofenac  Sodium (VOLTAREN ) 1 % GEL Apply 1 Application topically 4 (four) times daily as needed (pain.).    [provider]  docusate sodium  (COLACE) 100 MG capsule Take 100 mg by mouth 2 (two) times daily.    [provider]  eszopiclone (LUNESTA) 2 MG TABS tablet Take 2 mg by mouth at bedtime. 03/27/23   [provider]  febuxostat  (ULORIC ) 40 MG tablet Take 40 mg by mouth in the morning.    [provider]  furosemide  (LASIX ) 80 MG tablet Take 80 mg by mouth daily as needed for fluid or edema. 09/01/23   [provider]  loratadine  (CLARITIN ) 10 MG tablet Take 10 mg by mouth 2 (two) times daily. 12/12/22   [provider]  Methoxy PEG-Epoetin   Beta (MIRCERA IJ) 60 mcg. 08/07/23 08/05/24  [provider]  metoprolol  succinate (TOPROL -XL) 100 MG 24 hr tablet Take 0.5-1 tablet (50-100 mg total) by mouth daily as needed for elevated heart rate. Take with or immediately following a meal. Patient taking differently: Take 100 mg by mouth daily. 06/05/23   Santo Stanly LABOR, MD  Multiple Vitamins-Minerals (PRESERVISION AREDS 2+MULTI VIT PO) Take 1 capsule by mouth 2 (two) times daily.    [provider]  pantoprazole  (PROTONIX ) 40 MG tablet Take 1 tablet (40 mg total) by mouth daily. 06/05/23   Santo Stanly LABOR, MD  PROAIR  HFA 108 (90 BASE) MCG/ACT inhaler Inhale 1-2 puffs into the lungs every 6 (six) hours as needed for shortness of breath or wheezing (for respiratory issues.). 12/03/14   [provider]  rOPINIRole  (REQUIP ) 0.5 MG tablet Take 1 tablet (0.5 mg total) by mouth every 12 (twelve) hours as needed (Restless legs). Patient taking differently: Take 0.5 mg by mouth every 12 (twelve) hours as needed (Restless legs). Takes 1 tablet twice daily on dialysis days and 1 tablet once daily on the other days 04/25/23   Jillian Buttery, MD  sevelamer  carbonate (RENVELA ) 800 MG tablet Take 800 mg by mouth. 2 tablets tid with meals 03/15/23   [provider]    Physical Exam: Vitals:   12/06/23 1709 12/06/23 2130 12/06/23 2145 12/06/23 2155  BP: (!) 155/103 126/70 (!) 132/55   Pulse: 94  100   Resp: 18  20   Temp: 98.8 F (37.1 C)   98.8 F (37.1 C)  TempSrc:    Oral  SpO2: 97% 99% 95%    Constitutional: Resting in bed, NAD, calm, comfortable Eyes: EOMI, lids and conjunctivae normal ENMT: Mucous membranes are moist. Posterior pharynx clear of any exudate or lesions.Normal dentition.  Neck: normal, supple, no masses. Respiratory: clear to auscultation bilaterally, no wheezing, no crackles. Normal respiratory effort. No accessory muscle use.  Cardiovascular: Regular rate and rhythm, no murmurs /  rubs / gallops. No extremity edema. 2+ pedal pulses.  RUE AVF with palpable thrill. Abdomen: no tenderness, no masses palpated. Musculoskeletal: no clubbing / cyanosis. No joint deformity upper and lower extremities. Good ROM, no contractures. Normal muscle tone.  Skin: Left posterior scalp s/p repair with 2 sutures in place.  No rashes,  lesions, ulcers. No induration Neurologic: Sensation intact. Strength 5/5 in all 4.  Psychiatric: Normal judgment and insight. Alert and oriented x 3. Normal mood.   EKG: Personally reviewed.  Interpretation limited due to motion artifact, appears to be sinus with PACs/PVCs.  Telemetry appears to show sinus rhythm as well.  Previous EKG showed sinus rhythm with frequent PVCs.  Assessment/Plan Principal Problem:   SAH (subarachnoid hemorrhage) (HCC) Active Problems:   Acute ischemic stroke (HCC)   Paroxysmal atrial fibrillation (HCC)   ESRD on dialysis (HCC)   Pure hypercholesterolemia   Scalp laceration, initial encounter   Anemia of chronic renal failure   Coronary artery disease   Rachel Villarreal is a 78 y.o. 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  Assessment and Plan: Subarachnoid hemorrhage: Patient presenting after head injury after fall at home.  CT head shows a 5 mm curvilinear hyperdensity along the left sylvian fissure which may represent a tiny SAH.  EDP spoke with neurosurgery who recommended repeat CT head in 12 hours and if no change can discharge from their perspective. - Holding Eliquis  - Repeat CT head ordered for 6 AM 8/29  Acute ischemic nonhemorrhagic infarct involving right occipital cortex: Likely incidental finding on MRI brain.  Neurology recommended MRA head and neck but otherwise no  further workup as patient is not a candidate for antiplatelets at this time. - Follow MRA head/neck - Holding Eliquis  as above - PT/OT eval  Paroxysmal atrial fibrillation: Continue Toprol -XL.  Holding Eliquis .  ESRD on TTS HD: Completed her usual dialysis today.  Will need nephrology consult if she remains in the hospital on Saturday.  Recurrent postdialysis dizziness with fall at home: Suspect she is getting hypotensive or hypovolemic after dialysis.  Consider addition of midodrine  on dialysis days if getting hypotensive.  PT/OT eval.  Left posterior scalp laceration s/p lac repair in the ED: Will need follow-up for suture removal in 7-10 days.  Coronary artery disease: Stable, denies chest pain.  Has not been on antiplatelets as she has been on Eliquis  which is currently on hold as above.  Continue atorvastatin .  Anemia of ESRD: Hemoglobin stable at 10.0.  Hypokalemia: Supplemented.   DVT prophylaxis: SCD's Start: 12/06/23 2319 Code Status: Full code, confirmed with patient on admission Family Communication: Discussed with patient, she has discussed with her family and her primary contact Disposition Plan: From home, dispo pending clinical progress Consults called: EDP discussed with neurosurgery and neurology Severity of Illness: The appropriate patient status for this patient is OBSERVATION. Observation status is judged to be reasonable and necessary in order to provide the required intensity of service to ensure the patient's safety. The patient's presenting symptoms, physical exam findings, and initial radiographic and laboratory data in the context of their medical condition is felt to place them at decreased risk for further clinical deterioration. Furthermore, it is anticipated that the patient will be medically stable for discharge from the hospital within 2 midnights of admission.   Jorie Blanch MD [facility]  If 7PM-7AM, please contact  night-coverage [email or web address]  12/06/2023, 11:30 PM

## 2023-12-06 NOTE — Hospital Course (Signed)
 Rachel Villarreal is a 78 y.o. 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

## 2023-12-06 NOTE — ED Triage Notes (Signed)
 Patient from home via EMS. Patient lost her balance, falling into the fridge door. 0.5 inch lac to back of head with bleeding controlled. T, Th, S dialysis patient with last treatment today. Dizziness has been ongoing and is not new today.

## 2023-12-06 NOTE — ED Notes (Signed)
 CCMD called.

## 2023-12-06 NOTE — ED Provider Notes (Signed)
 [place] EMERGENCY DEPARTMENT AT [facility] Provider Note   CSN: [id number] Arrival date & time: 12/06/23  1659     Patient presents with: No chief complaint on file.   Rachel Villarreal is a 78 y.o. female.   Pt is a 78 yo female with pmhx significant for ESRD on HD (Tu, Th, Sat), GERD, Asthma, HTN, afib (on Eliquis ) Gout, Breast Cancer, and anemia.  Pt has had several episodes after dialysis of feeling dizzy.  She had dialysis again today and felt very dizzy.  She fell back and hit her head.  She has chronic neck pain and has some neck pain, but she is not sure if that's new or not.  She was seen in the ED on 8/21 for dizziness s/p dialysis and saw her pcp yesterday for the same.          Prior to Admission medications   Medication Sig Start Date End Date Taking? Authorizing Provider  acetaminophen  (TYLENOL ) 500 MG tablet Take 500 mg by mouth every 6 (six) hours as needed for mild pain (pain score 1-3).    [provider]  anastrozole  (ARIMIDEX ) 1 MG tablet TAKE 1 TABLET BY MOUTH EVERY DAY 11/15/23   Odean Potts, MD  apixaban  (ELIQUIS ) 5 MG TABS tablet Take 1 tablet (5 mg total) by mouth 2 (two) times daily. 06/05/23   Santo Stanly LABOR, MD  atorvastatin  (LIPITOR ) 80 MG tablet Take 1 tablet (80 mg total) by mouth in the morning. 06/05/23   Chandrasekhar, Mahesh A, MD  budesonide  (RHINOCORT  AQUA) 32 MCG/ACT nasal spray Place 2 sprays into both nostrils in the morning. 12/12/22   [provider]  Cholecalciferol 50 MCG (2000 UT) CAPS Take 2,000 Units by mouth in the morning. 12/27/22   [provider]  diclofenac  Sodium (VOLTAREN ) 1 % GEL Apply 1 Application topically 4 (four) times daily as needed (pain.).    [provider]  docusate sodium  (COLACE) 100 MG capsule Take 100 mg by mouth 2 (two) times daily.    [provider]  eszopiclone (LUNESTA) 2 MG TABS tablet Take 2 mg by mouth at bedtime. 03/27/23   [provider]   febuxostat  (ULORIC ) 40 MG tablet Take 40 mg by mouth in the morning.    [provider]  furosemide  (LASIX ) 80 MG tablet Take 80 mg by mouth daily as needed for fluid or edema. 09/01/23   [provider]  loratadine  (CLARITIN ) 10 MG tablet Take 10 mg by mouth 2 (two) times daily. 12/12/22   [provider]  Methoxy PEG-Epoetin  Beta (MIRCERA IJ) 60 mcg. 08/07/23 08/05/24  [provider]  metoprolol  succinate (TOPROL -XL) 100 MG 24 hr tablet Take 0.5-1 tablet (50-100 mg total) by mouth daily as needed for elevated heart rate. Take with or immediately following a meal. Patient taking differently: Take 100 mg by mouth daily. 06/05/23   Santo Stanly LABOR, MD  Multiple Vitamins-Minerals (PRESERVISION AREDS 2+MULTI VIT PO) Take 1 capsule by mouth 2 (two) times daily.    [provider]  pantoprazole  (PROTONIX ) 40 MG tablet Take 1 tablet (40 mg total) by mouth daily. 06/05/23   Santo Stanly LABOR, MD  PROAIR  HFA 108 (90 BASE) MCG/ACT inhaler Inhale 1-2 puffs into the lungs every 6 (six) hours as needed for shortness of breath or wheezing (for respiratory issues.). 12/03/14   [provider]  rOPINIRole  (REQUIP ) 0.5 MG tablet Take 1 tablet (0.5 mg total) by mouth every 12 (twelve) hours  as needed (Restless legs). Patient taking differently: Take 0.5 mg by mouth every 12 (twelve) hours as needed (Restless legs). Takes 1 tablet twice daily on dialysis days and 1 tablet once daily on the other days 04/25/23   Jillian Buttery, MD  sevelamer  carbonate (RENVELA ) 800 MG tablet Take 800 mg by mouth. 2 tablets tid with meals 03/15/23   [provider]    Allergies: Ace inhibitors, Lactose intolerance (gi), Letrozole , Lisinopril, Mercury, and Tape    Review of Systems  Skin:  Positive for wound.  Neurological:  Positive for dizziness.  All other systems reviewed and are negative.   Updated Vital Signs BP (!) 132/55   Pulse 100   Temp 98.8 F  (37.1 C) (Oral)   Resp 20   SpO2 95%   Physical Exam Vitals and nursing note reviewed.  Constitutional:      Appearance: Normal appearance.  HENT:     Head: Normocephalic.     Comments: Small lac posterior scalp    Right Ear: External ear normal.     Left Ear: External ear normal.     Nose: Nose normal.     Mouth/Throat:     Mouth: Mucous membranes are moist.     Pharynx: Oropharynx is clear.  Eyes:     Extraocular Movements: Extraocular movements intact.     Conjunctiva/sclera: Conjunctivae normal.     Pupils: Pupils are equal, round, and reactive to light.  Cardiovascular:     Rate and Rhythm: Normal rate and regular rhythm.     Pulses: Normal pulses.     Heart sounds: Normal heart sounds.  Pulmonary:     Effort: Pulmonary effort is normal.     Breath sounds: Normal breath sounds.  Abdominal:     General: Abdomen is flat. Bowel sounds are normal.     Palpations: Abdomen is soft.  Musculoskeletal:        General: Normal range of motion.     Cervical back: Normal range of motion and neck supple.     Comments: Right arm + AVF  Skin:    General: Skin is warm.     Capillary Refill: Capillary refill takes less than 2 seconds.  Neurological:     General: No focal deficit present.     Mental Status: She is alert and oriented to person, place, and time.  Psychiatric:        Mood and Affect: Mood normal.        Behavior: Behavior normal.     (all labs ordered are listed, but only abnormal results are displayed) Labs Reviewed  CBC WITH DIFFERENTIAL/PLATELET - Abnormal; Notable for the following components:      Result Value   WBC 14.1 (*)    RBC 3.20 (*)    Hemoglobin 10.0 (*)    HCT 30.0 (*)    Neutro Abs 12.0 (*)    Lymphs Abs 0.6 (*)    Abs Immature Granulocytes 0.36 (*)    All other components within normal limits  COMPREHENSIVE METABOLIC PANEL WITH GFR - Abnormal; Notable for the following components:   Potassium 3.2 (*)    Chloride 96 (*)    Glucose, Bld  109 (*)    Creatinine, Ser 2.92 (*)    Total Protein 6.4 (*)    Albumin 2.8 (*)    GFR, Estimated 16 (*)    All other components within normal limits  CBG MONITORING, ED    EKG: None  Radiology: MR BRAIN WO  CONTRAST Result Date: 12/06/2023 CLINICAL DATA:  Initial evaluation for acute neuro deficit, stroke suspected. EXAM: MRI HEAD WITHOUT CONTRAST TECHNIQUE: Multiplanar, multiecho pulse sequences of the brain and surrounding structures were obtained without intravenous contrast. COMPARISON:  CT from earlier the same day. FINDINGS: Brain: Mild diffuse prominence of the CSF containing spaces compatible with generalized cerebral atrophy. Patchy and confluent T2/FLAIR hyperintensity involving the periventricular deep white matter both cerebral hemispheres as well as the pons, consistent with chronic small vessel ischemic disease, moderate in nature. Few small remote lacunar infarcts present about the bilateral basal ganglia, right thalamus, and left cerebellum. Single punctate focus of diffusion signal abnormality seen involving the right occipital cortex, suspicious for a tiny acute ischemic nonhemorrhagic infarct (series 2, image 25). No other evidence for acute or subacute ischemia. Gray-white matter differentiation otherwise maintained. No other acute or significant chronic intracranial blood products. No mass lesion, midline shift or mass effect no hydrocephalus or extra-axial fluid collection. Pituitary gland within normal limits. Vascular: Right vertebral artery hypoplastic and not well seen. Major intracranial vascular flow voids are otherwise maintained. Skull and upper cervical spine: Craniocervical junction within normal limits. Bone marrow signal intensity normal. Probable small soft tissue contusion noted at the left occipital scalp (series 2, image 31). Sinuses/Orbits: Prior bilateral ocular lens replacement. Mild chronic mucosal thickening present about the left maxillary sinus. Paranasal  sinuses are otherwise largely clear. Small left mastoid effusion noted, of doubtful significance. Other: None. IMPRESSION: 1. Single punctate focus of diffusion signal abnormality involving the right occipital cortex, suspicious for a tiny acute ischemic nonhemorrhagic infarct. 2. No other acute intracranial abnormality. 3. Underlying age-related cerebral atrophy with moderate chronic microvascular ischemic disease, with a few scattered remote lacunar infarcts as above. 4. Small soft tissue contusion at the left occipital scalp. Electronically Signed   By: Morene Hoard M.D.   On: 12/06/2023 21:16   DG Chest 1 View Result Date: 12/06/2023 CLINICAL DATA:  106001 Syncope 106001 EXAM: CHEST  1 VIEW COMPARISON:  October 23, 2023 FINDINGS: Low lung volumes with bilateral perihilar interstitial opacities. Patchy airspace opacities in the retrocardiac left lung base. Elevation of the right hemidiaphragm. No pneumothorax or pleural effusion. Mild cardiomegaly.No acute fracture or destructive lesion. IMPRESSION: 1. Bilateral perihilar interstitial opacities, which may represent bronchovascular crowding due to low lung volumes, interstitial edema, or atypical/viral infection. 2. Patchy opacities in the retrocardiac left lung base, likely atelectasis. Alternatively, a developing bronchopneumonia could have this appearance in the correct clinical context. Electronically Signed   By: Rogelia Myers M.D.   On: 12/06/2023 19:09   CT HEAD WO CONTRAST Addendum Date: 12/06/2023 ADDENDUM REPORT: 12/06/2023 18:24 ADDENDUM: These results were called by telephone at the time of interpretation on 12/06/2023 at 6:24 pm to provider Redell Nazir , who verbally acknowledged these results. Electronically Signed   By: Morgane  Naveau M.D.   On: 12/06/2023 18:24   Result Date: 12/06/2023 CLINICAL DATA:  Polytrauma, blunt EXAM: CT HEAD WITHOUT CONTRAST CT CERVICAL SPINE WITHOUT CONTRAST TECHNIQUE: Multidetector CT imaging of the  head and cervical spine was performed following the standard protocol without intravenous contrast. Multiplanar CT image reconstructions of the cervical spine were also generated. RADIATION DOSE REDUCTION: This exam was performed according to the departmental dose-optimization program which includes automated exposure control, adjustment of the mA and/or kV according to patient size and/or use of iterative reconstruction technique. COMPARISON:  None Available. FINDINGS: CT HEAD FINDINGS Brain: Cerebral ventricle sizes are concordant with the degree of cerebral volume  loss. Patchy and confluent areas of decreased attenuation are noted throughout the deep and periventricular white matter of the cerebral hemispheres bilaterally, compatible with chronic microvascular ischemic disease. No evidence of large-territorial acute infarction. 5 mm curvilinear hyperdensity along the left sylvian fissure best noted on sagittal view (5:11). No parenchymal hemorrhage. No mass lesion.No mass effect or midline shift. No hydrocephalus. Basilar cisterns are patent. Vascular: No hyperdense vessel. Skull: No acute fracture or focal lesion. Sinuses/Orbits: Paranasal sinuses and mastoid air cells are clear. The orbits are unremarkable. Other: None. CT CERVICAL SPINE FINDINGS Alignment: Normal. Skull base and vertebrae: Multilevel mild-to-moderate degenerative change of the spine most prominent at the C4-C6 levels. Associated severe osseous neural foraminal stenosis at the left C5-C6 level. No acute fracture. No aggressive appearing focal osseous lesion or focal pathologic process. Soft tissues and spinal canal: No prevertebral fluid or swelling. No visible canal hematoma. Upper chest: Unremarkable. Other: Atherosclerotic plaque of the carotid arteries within the neck. IMPRESSION: 1. A 5 mm curvilinear hyperdensity along the left sylvian fissure may represent a tiny subarachnoid hemorrhage. 2. No acute displaced fracture or traumatic  listhesis of the cervical spine. Electronically Signed: By: Morgane  Naveau M.D. On: 12/06/2023 18:20   CT Cervical Spine Wo Contrast Addendum Date: 12/06/2023 ADDENDUM REPORT: 12/06/2023 18:24 ADDENDUM: These results were called by telephone at the time of interpretation on 12/06/2023 at 6:24 pm to provider Atina Feeley , who verbally acknowledged these results. Electronically Signed   By: Morgane  Naveau M.D.   On: 12/06/2023 18:24   Result Date: 12/06/2023 CLINICAL DATA:  Polytrauma, blunt EXAM: CT HEAD WITHOUT CONTRAST CT CERVICAL SPINE WITHOUT CONTRAST TECHNIQUE: Multidetector CT imaging of the head and cervical spine was performed following the standard protocol without intravenous contrast. Multiplanar CT image reconstructions of the cervical spine were also generated. RADIATION DOSE REDUCTION: This exam was performed according to the departmental dose-optimization program which includes automated exposure control, adjustment of the mA and/or kV according to patient size and/or use of iterative reconstruction technique. COMPARISON:  None Available. FINDINGS: CT HEAD FINDINGS Brain: Cerebral ventricle sizes are concordant with the degree of cerebral volume loss. Patchy and confluent areas of decreased attenuation are noted throughout the deep and periventricular white matter of the cerebral hemispheres bilaterally, compatible with chronic microvascular ischemic disease. No evidence of large-territorial acute infarction. 5 mm curvilinear hyperdensity along the left sylvian fissure best noted on sagittal view (5:11). No parenchymal hemorrhage. No mass lesion.No mass effect or midline shift. No hydrocephalus. Basilar cisterns are patent. Vascular: No hyperdense vessel. Skull: No acute fracture or focal lesion. Sinuses/Orbits: Paranasal sinuses and mastoid air cells are clear. The orbits are unremarkable. Other: None. CT CERVICAL SPINE FINDINGS Alignment: Normal. Skull base and vertebrae: Multilevel  mild-to-moderate degenerative change of the spine most prominent at the C4-C6 levels. Associated severe osseous neural foraminal stenosis at the left C5-C6 level. No acute fracture. No aggressive appearing focal osseous lesion or focal pathologic process. Soft tissues and spinal canal: No prevertebral fluid or swelling. No visible canal hematoma. Upper chest: Unremarkable. Other: Atherosclerotic plaque of the carotid arteries within the neck. IMPRESSION: 1. A 5 mm curvilinear hyperdensity along the left sylvian fissure may represent a tiny subarachnoid hemorrhage. 2. No acute displaced fracture or traumatic listhesis of the cervical spine. Electronically Signed: By: Morgane  Naveau M.D. On: 12/06/2023 18:20     .Laceration Repair  Date/Time: 12/06/2023 8:01 PM  Performed by: Dean Clarity, MD Authorized by: Dean Clarity, MD   Consent:  Consent obtained:  Verbal   Consent given by:  Patient   Alternatives discussed:  No treatment Universal protocol:    Patient identity confirmed:  Verbally with patient Anesthesia:    Anesthesia method:  Local infiltration and topical application   Topical anesthetic:  LET   Local anesthetic:  Lidocaine  2% WITH epi Laceration details:    Location:  Scalp   Scalp location:  Occipital   Length (cm):  1 Pre-procedure details:    Preparation:  Patient was prepped and draped in usual sterile fashion Treatment:    Area cleansed with:  Saline   Amount of cleaning:  Standard   Irrigation solution:  Sterile saline Skin repair:    Repair method:  Sutures   Suture size:  4-0   Suture material:  Prolene   Number of sutures:  2 Approximation:    Approximation:  Close Repair type:    Repair type:  Simple Post-procedure details:    Procedure completion:  Tolerated well, no immediate complications    Medications Ordered in the ED  sodium chloride  0.9 % bolus 500 mL (500 mLs Intravenous New Bag/Given 12/06/23 2120)  lidocaine -EPINEPHrine -tetracaine   (LET) topical gel (3 mLs Topical Given 12/06/23 1958)  lidocaine -EPINEPHrine  (XYLOCAINE  W/EPI) 2 %-1:200000 (PF) injection 10 mL (10 mLs Infiltration Given 12/06/23 1958)  LORazepam  (ATIVAN ) tablet 1 mg (1 mg Oral Given 12/06/23 2002)                                    Medical Decision Making Amount and/or Complexity of Data Reviewed Labs: ordered. Radiology: ordered.  Risk Prescription drug management. Decision regarding hospitalization.   This patient presents to the ED for concern of syncope, this involves an extensive number of treatment options, and is a complaint that carries with it a high risk of complications and morbidity.  The differential diagnosis includes orthostatic, vasovagal, cardiogenic   Co morbidities that complicate the patient evaluation  ESRD on HD (Tu, Th, Sat), GERD, Asthma, HTN, afib (on Eliquis ) Gout, Breast Cancer, and anemia   Additional history obtained:  Additional history obtained from epic chart review External records from outside source obtained and reviewed including EMS report   Lab Tests:  I Ordered, and personally interpreted labs.  The pertinent results include:  cbc with wbc elevated at 14, hgb 10.0 (stable); cmp with k low at 3.2, cr elevated at 2.92   Imaging Studies ordered:  I ordered imaging studies including ct head/ct c-spine/MRI brain I independently visualized and interpreted imaging which showed  CT head/c-spine: A 5 mm curvilinear hyperdensity along the left sylvian fissure  may represent a tiny subarachnoid hemorrhage.  2. No acute displaced fracture or traumatic listhesis of the  cervical spine.  MRI brain:  Single punctate focus of diffusion signal abnormality involving  the right occipital cortex, suspicious for a tiny acute ischemic  nonhemorrhagic infarct.  2. No other acute intracranial abnormality.  3. Underlying age-related cerebral atrophy with moderate chronic  microvascular ischemic disease, with a few  scattered remote lacunar  infarcts as above.  4. Small soft tissue contusion at the left occipital scalp.   I agree with the radiologist interpretation   Cardiac Monitoring:  The patient was maintained on a cardiac monitor.  I personally viewed and interpreted the cardiac monitored which showed an underlying rhythm of: nsr   Medicines ordered and prescription drug management:  I ordered medication including ivfs  for  sx  Reevaluation of the patient after these medicines showed that the patient improved I have reviewed the patients home medicines and have made adjustments as needed   Test Considered:  Ct/mri   Critical Interventions:  ivfs   Consultations Obtained:  I requested consultation with the neurosurgeon (Elsner),  and discussed lab and imaging findings as well as pertinent plan - he recommends holding the Eliquis  and repeating the scan in 12 hrs. I spoke with neurology (Dr. Vanessa) who recommended MRI w/o head/neck to make sure she did not have any critical stenosis. Pt d/w Dr. ALONSO Blanch ([facility]) for admission.   Problem List / ED Course:  SAH:  NS recommends repeat CT in 12 hours Dizziness:  likely due to bp changes with dialysis vs vertigo.  Possible CVA does not correlate with sx. ? CVA:  pt would not qualify for any asa, plavix or DOAC due to [facility].   Reevaluation:  After the interventions noted above, I reevaluated the patient and found that they have :improved   Social Determinants of Health:  Lives at home   Dispostion:  After consideration of the diagnostic results and the patients response to treatment, I feel that the patent would benefit from admission.       Final diagnoses:  SAH (subarachnoid hemorrhage) (HCC)  On apixaban  therapy  Laceration of scalp, initial encounter  ESRD on hemodialysis [facility])    ED Discharge Orders     None          Dean Clarity, MD 12/06/23 2205

## 2023-12-07 ENCOUNTER — Observation Stay (HOSPITAL_COMMUNITY)

## 2023-12-07 ENCOUNTER — Encounter (HOSPITAL_COMMUNITY): Payer: Self-pay | Admitting: Internal Medicine

## 2023-12-07 DIAGNOSIS — I609 Nontraumatic subarachnoid hemorrhage, unspecified: Secondary | ICD-10-CM | POA: Diagnosis not present

## 2023-12-07 LAB — CBC
HCT: 26.9 % — ABNORMAL LOW (ref 36.0–46.0)
Hemoglobin: 9.1 g/dL — ABNORMAL LOW (ref 12.0–15.0)
MCH: 32 pg (ref 26.0–34.0)
MCHC: 33.8 g/dL (ref 30.0–36.0)
MCV: 94.7 fL (ref 80.0–100.0)
Platelets: 239 K/uL (ref 150–400)
RBC: 2.84 MIL/uL — ABNORMAL LOW (ref 3.87–5.11)
RDW: 15 % (ref 11.5–15.5)
WBC: 12 K/uL — ABNORMAL HIGH (ref 4.0–10.5)
nRBC: 0 % (ref 0.0–0.2)

## 2023-12-07 LAB — BASIC METABOLIC PANEL WITH GFR
Anion gap: 14 (ref 5–15)
BUN: 18 mg/dL (ref 8–23)
CO2: 26 mmol/L (ref 22–32)
Calcium: 8.9 mg/dL (ref 8.9–10.3)
Chloride: 99 mmol/L (ref 98–111)
Creatinine, Ser: 3.71 mg/dL — ABNORMAL HIGH (ref 0.44–1.00)
GFR, Estimated: 12 mL/min — ABNORMAL LOW (ref >60)
Glucose, Bld: 140 mg/dL — ABNORMAL HIGH (ref 70–99)
Potassium: 3.5 mmol/L (ref 3.5–5.1)
Sodium: 139 mmol/L (ref 135–145)

## 2023-12-07 LAB — MAGNESIUM: Magnesium: 1.8 mg/dL (ref 1.7–2.4)

## 2023-12-07 NOTE — Evaluation (Signed)
 Occupational Therapy Evaluation Patient Details Name: Rachel Villarreal MRN: [id number] DOB: 1945/07/03 Today's Date: 12/07/2023   History of Present Illness   78 y.o. female presents to [facility] 12/06/23 after falling backwards at home after having dialysis. Pt with tiny SAH and small contusion at L occipital scalp. Chest x-ray showed B perihilar interstitial opacities and patchy opacities in retrocardiac L lung base. PMH: HTN, ESRD on HD, breast cancer, CAD, HFimpEF, PAF on eliquis .     Clinical Impressions Pt admitted for above, PTA pt lived alone and reports being ind with her ADLs/iADLs and mobility. Pt recalls all events prior to fall, able to state exact course of events leading to fall. She currently presents close to her functional baseline, ambulating ind in room prior to OT arriving and completing ADLs ind. Her vision is [facility], does report a hx of intermittent blurring of vision that has been on/off for a while now. She has no further acute skilled OT needs, no post acute OT recommended.      If plan is discharge home, recommend the following:    (PRN)     Functional Status Assessment   Patient has not had a recent decline in their functional status     Equipment Recommendations   None recommended by OT     Recommendations for Other Services         Precautions/Restrictions   Precautions Precautions: Fall Restrictions Weight Bearing Restrictions Per Provider Order: No     Mobility Bed Mobility Overal bed mobility: Modified Independent                  Transfers                   General transfer comment: Pt was ambulating OOB prior to OT arrival, sat EOB when OT began session.      Balance Overall balance assessment: Mild deficits observed, not formally tested                                         ADL either performed or assessed with clinical judgement   ADL Overall ADL's : Independent                                              Vision Baseline Vision/History: 0 No visual deficits Ability to See in Adequate Light: 1 Impaired Patient Visual Report: Blurring of vision (Pt reports a hx of blurry vision at baseline and that it comes and goes) Vision Assessment?: Yes Eye Alignment: Within Functional Limits Ocular Range of Motion: Within Functional Limits Alignment/Gaze Preference: Within Defined Limits Tracking/Visual Pursuits: Able to track stimulus in all quads without difficulty Saccades: Other (comment) (NT) Convergence: Impaired (comment) (L eye did not converge) Visual Fields: No apparent deficits Diplopia Assessment: Other (comment) (denies) Additional Comments: Pt able to read small print of stroke booklet without challenge, missed 4 on letter E cancellation but in no particular order     Perception Perception: Within Functional Limits       Praxis Praxis: [facility]       Pertinent Vitals/Pain Pain Assessment Pain Assessment: No/denies pain     Extremity/Trunk Assessment Upper Extremity Assessment Upper Extremity Assessment: Overall WFL for tasks assessed (ROM, strength, coord similar bilat)   Lower  Extremity Assessment Lower Extremity Assessment: LLE deficits/detail LLE Deficits / Details: grossly 4+/5, reports pain in L knee and ankle at baseline   Cervical / Trunk Assessment Cervical / Trunk Assessment: Normal   Communication Communication Communication: No apparent difficulties   Cognition Arousal: Alert Behavior During Therapy: WFL for tasks assessed/performed Cognition: No apparent impairments                               Following commands: Intact       Cueing  General Comments   Cueing Techniques: Verbal cues;Tactile cues  VSS   Exercises     Shoulder Instructions      Home Living Family/patient expects to be discharged to:: Private residence Living Arrangements: Alone Available Help at Discharge: Friend(s);Available  PRN/intermittently Type of Home: House Home Access: Stairs to enter [employer] of Steps: 3-4 Entrance Stairs-Rails: Left Home Layout: Two level;Able to live on main level with bedroom/bathroom Alternate Level Stairs-Number of Steps: a step to the den and a step to the kitchen. Alternate Level Stairs-Rails: None Bathroom Shower/Tub: Tub/shower unit;Walk-in shower (stand alone shower and garden tub)   Bathroom Toilet: Handicapped height     Home Equipment: Shower seat - built [employer] fundraiser (2 wheels)          Prior Functioning/Environment Prior Level of Function : Independent/Modified Independent;Driving             Mobility Comments: Ind with no AD ADLs Comments: Ind with ADL's and IADL's.    OT Problem List: Impaired balance (sitting and/or standing)   OT Treatment/Interventions:        OT Goals(Current goals can be found in the care plan section)   Acute Rehab OT Goals OT Goal Formulation: All assessment and education complete, DC therapy Time For Goal Achievement: 12/21/23 Potential to Achieve Goals: Good   OT Frequency:       Co-evaluation              AM-PAC OT 6 Clicks Daily Activity     Outcome Measure Help from another person eating meals?: None Help from another person taking care of personal grooming?: None Help from another person toileting, which includes using toliet, bedpan, or urinal?: None Help from another person bathing (including washing, rinsing, drying)?: None Help from another person to put on and taking off regular upper body clothing?: None Help from another person to put on and taking off regular lower body clothing?: None 6 Click Score: 24   End of Session Nurse Communication: Mobility status  Activity Tolerance: Patient tolerated treatment well Patient left: in bed;with call bell/phone within reach  OT Visit Diagnosis: History of falling (Z91.81)                Time: 8943-8883 OT Time Calculation (min):  20 min Charges:  OT General Charges $OT Visit: 1 Visit OT Evaluation $OT Eval Low Complexity: 1 Low  12/07/2023  AB, OTR/L  Acute Rehabilitation Services  Office: [phone]   Curtistine JONETTA Das 12/07/2023, 1:39 PM

## 2023-12-07 NOTE — ED Notes (Signed)
 Called floor to notify pt was on their way--no answer.

## 2023-12-07 NOTE — Care Management Obs Status (Signed)
 MEDICARE OBSERVATION STATUS NOTIFICATION   Patient Details  Name: Rachel Villarreal MRN: [id number] Date of Birth: 1945/05/08   Medicare Observation Status Notification Given:  Yes  Obs notice signed and copy given  Claretta Deed 12/07/2023, 1:59 PM

## 2023-12-07 NOTE — Progress Notes (Signed)
 Patient dialyzes Tuesday Thursdays and Saturday second shift at [facility] with Dr. Dolan.  She actually drives herself to dialysis is a very compliant patient.  Here with a any subarachnoid hemorrhage after a fall with no focal neurological deficits.  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  Discussed with hospitalist staff and she would be an ideal candidate if stable to be discharged early Saturday to resume dialysis as outpatient.  Please call if patient discharge is delayed or if patient is going to be kept in the hospital overnight on Saturday and we will be happy to see the patient.

## 2023-12-07 NOTE — Plan of Care (Signed)

## 2023-12-07 NOTE — Evaluation (Signed)
 Physical Therapy Evaluation Patient Details Name: Rachel Villarreal MRN: [id number] DOB: 11-14-1945 Today's Date: 12/07/2023  History of Present Illness  78 y.o. female presents to [facility] 12/06/23 after falling backwards at home after having dialysis. Pt with tiny SAH and small contusion at L occipital scalp. Chest x-ray showed B perihilar interstitial opacities and patchy opacities in retrocardiac L lung base. PMH: HTN, ESRD on HD, breast cancer, CAD, HFimpEF, PAF on eliquis .   Clinical Impression  Pt in bed upon arrival and agreeable to PT eval. PTA, pt was independent for mobility with no AD. Pt declines falls, however, reports that she lists and has a tendency to veer and and lose her balance. Pt presents at functional mobility baseline being ModI to independent for gait and stair negotiation. Pt scored a 17/24 on the DGI indicating pt is at an increased risk of falling. Pt would benefit from OP PT to address balance deficits and functional impairments. Pt has intermittent help available upon d/c home. Pt has no further acute PT needs with acute PT signing off. Please re-consult if new needs arise.          If plan is discharge home, recommend the following: Assist for transportation   Can travel by private vehicle    Yes    Equipment Recommendations None recommended by PT     Functional Status Assessment Patient has not had a recent decline in their functional status     Precautions / Restrictions Precautions Precautions: Fall Restrictions Weight Bearing Restrictions Per Provider Order: No      Mobility  Bed Mobility Overal bed mobility: Modified Independent     Transfers Overall transfer level: Independent Equipment used: None     Ambulation/Gait Ambulation/Gait assistance: Modified independent (Device/Increase time) Gait Distance (Feet): 400 Feet Assistive device: None Gait Pattern/deviations: Step-through pattern, Decreased stride length Gait velocity: decr      General Gait Details: steady gait with no overt LOB  Stairs Stairs: Yes Stairs assistance: Modified independent (Device/Increase time) Stair Management: One rail Left, Step to pattern, Sideways, Forwards Number of Stairs: 6 General stair comments: practiced sideways step to pattern with one handrail and forwards pattern with one handrail  Modified Rankin (Stroke Patients Only) Modified Rankin (Stroke Patients Only) Pre-Morbid Rankin Score: No symptoms Modified Rankin: No symptoms     Balance    Standardized Balance Assessment Standardized Balance Assessment : Dynamic Gait Index   Dynamic Gait Index Level Surface: Mild Impairment Change in Gait Speed: Moderate Impairment Gait with Horizontal Head Turns: Mild Impairment Gait with Vertical Head Turns: Mild Impairment Gait and Pivot Turn: Normal Step Over Obstacle: Normal Step Around Obstacles: Normal Steps: Moderate Impairment Total Score: 17       Pertinent Vitals/Pain Pain Assessment Pain Assessment: No/denies pain    Home Living Family/patient expects to be discharged to:: Private residence Living Arrangements: Alone Available Help at Discharge: Friend(s);Available PRN/intermittently Type of Home: House Home Access: Stairs to enter Entrance Stairs-Rails: Left Entrance Stairs-Number of Steps: 3-4 Alternate Level Stairs-Number of Steps: a step to the den and a step to the kitchen. Home Layout: Two level;Able to live on main level with bedroom/bathroom Home Equipment: Shower seat - built [employer] fundraiser (2 wheels)      Prior Function Prior Level of Function : Independent/Modified Independent;Driving    Mobility Comments: Ind with no AD ADLs Comments: Ind with ADL's and IADL's.     Extremity/Trunk Assessment   Upper Extremity Assessment Upper Extremity Assessment: Defer to OT evaluation  Lower Extremity Assessment Lower Extremity Assessment: LLE deficits/detail LLE Deficits / Details: grossly 4+/5,  reports pain in L knee and ankle at baseline    Cervical / Trunk Assessment Cervical / Trunk Assessment: Normal  Communication   Communication Communication: No apparent difficulties    Cognition Arousal: Alert Behavior During Therapy: WFL for tasks assessed/performed   PT - Cognitive impairments: No apparent impairments    Following commands: Intact       Cueing Cueing Techniques: Verbal cues, Tactile cues      PT Assessment All further PT needs can be met in the next venue of care  PT Problem List Decreased strength;Decreased activity tolerance;Decreased balance;Decreased mobility           PT Goals (Current goals can be found in the Care Plan section)  Acute Rehab PT Goals PT Goal Formulation: All assessment and education complete, DC therapy     AM-PAC PT 6 Clicks Mobility  Outcome Measure Help needed turning from your back to your side while in a flat bed without using bedrails?: None Help needed moving from lying on your back to sitting on the side of a flat bed without using bedrails?: None Help needed moving to and from a bed to a chair (including a wheelchair)?: None Help needed standing up from a chair using your arms (e.g., wheelchair or bedside chair)?: None Help needed to walk in hospital room?: None Help needed climbing 3-5 steps with a railing? : None 6 Click Score: 24    End of Session Equipment Utilized During Treatment: Gait belt Activity Tolerance: Patient tolerated treatment well Patient left: Other (comment) (in bathroom) Nurse Communication: Mobility status PT Visit Diagnosis: Other abnormalities of gait and mobility (R26.89);Unsteadiness on feet (R26.81)    Time: 8980-8952 PT Time Calculation (min) (ACUTE ONLY): 28 min   Charges:   PT Evaluation $PT Eval Low Complexity: 1 Low PT Treatments $Therapeutic Activity: 8-22 mins PT General Charges $$ ACUTE PT VISIT: 1 Visit       Rachel Villarreal, PT, DPT Secure Chat Preferred  Rehab Office  [phone]   Rachel Villarreal Rachel Villarreal 12/07/2023, 10:56 AM

## 2023-12-07 NOTE — Progress Notes (Addendum)
 PROGRESS NOTE    Rachel Villarreal  FMW:[id number] DOB: 01/15/1946 DOA: 12/06/2023 PCP: Ransom Other, MD   Brief Narrative:   78 y.o. 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  CT head without contrast showed a 5 mm curvilinear hyperdensity along the left sylvian fissure which may represent a tiny subarachnoid hemorrhage. CT cervical spine without contrast negative for acute displaced fracture or traumatic listhesis. Single punctate focus of diffusion signal abnormality involving the right occipital cortex, suspicious for a tiny acute ischemic nonhemorrhagic infarct.  Neurosurgery and neurology has been consulted. Patient has no neuro deficits at present.  Assessment & Plan:  Principal Problem:   SAH (subarachnoid hemorrhage) (HCC) Active Problems:   Acute ischemic stroke (HCC)   Paroxysmal atrial fibrillation (HCC)   ESRD on dialysis (HCC)   Pure hypercholesterolemia   Scalp laceration, initial encounter   Anemia of chronic renal failure   Coronary artery disease   78 y.o. 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   Assessment and Plan: Subarachnoid hemorrhage,POA: Patient presenting after head injury after fall at home.   CT head shows a 5 mm curvilinear hyperdensity along the left sylvian fissure which may represent a tiny SAH.  EDP spoke with neurosurgery who recommended repeat CT head in 12 hours - Holding Eliquis  - Repeat CT head didn't show any new or increasing hemorrhage.   Acute ischemic nonhemorrhagic infarct involving right occipital cortex,POA: Likely  incidental finding on MRI brain.  Neurology recommended MRA head and neck but otherwise no further workup as patient is not a candidate for antiplatelets at this time.  - MRA Head: Negative intracranial MRA for large vessel occlusion,Moderate intracranial atherosclerotic disease, with most notable findings including moderate to severe left M1 and M2 stenoses, distal right A3 stenosis, and distal right V4 stenoses.  -MRA Neck:Increasing attenuation of the right vertebral artery as it courses cephalad in the neck, potentially including at the skull base. Dominant left vertebral artery widely patent,  Atheromatous irregularity about the carotid bifurcations without hemodynamically  significant greater than 50% stenosis. - Holding Eliquis  as above - PT/OT eval   Paroxysmal atrial fibrillation: Continue Toprol -XL.  Holding Eliquis .   ESRD on TTS HD: Last HD was on 8/28. Will consult nephrology for maintenance dialysis.   Recurrent postdialysis dizziness with fall at home: Suspect she is getting hypotensive or hypovolemic after dialysis.  Consider addition of midodrine  on dialysis days if getting hypotensive.  PT/OT eval.   Left posterior scalp laceration s/p lac repair in the ED: Will need follow-up for suture removal in 7-10 days.   Coronary artery disease: Stable, denies chest pain.  Has not been on antiplatelets as she has been on Eliquis  which is currently on hold as above.  Continue atorvastatin .   Anemia of ESRD: Hemoglobin stable   Hypokalemia: Prn repletion  Disposition: Lives at home by herself and is IADL.   DVT prophylaxis: SCD's Start: 12/06/23 2319     Code Status: Full Code Family Communication:  None at the bedside Status is: Observation The patient remains OBS appropriate and will d/c before 2 midnights.  Subjective:  She said she fell yesterday at home and hit left side of her head. She was feeling dizzy. She does have occasional dizziness after HD  sessions because of labile BP. She has been on eliquis  since this January and had no episodes of bleeding on it. She lives at home by herself and is IADL. Last HD was yesterday. She has family in [place] .    Examination:  General exam: Appears calm and comfortable  Respiratory system: Clear to auscultation. Respiratory effort normal. Cardiovascular system: S1 & S2 heard, RRR. No JVD, murmurs, rubs, gallops or clicks. No pedal edema. Gastrointestinal system: Abdomen is nondistended, soft and nontender. No organomegaly or masses felt. Normal bowel sounds heard. Central nervous system: Alert and oriented. No focal neurological deficits. Extremities: Symmetric 5 x 5 power. Skin: No rashes, lesions or ulcers Psychiatry: Judgement and insight appear normal. Mood & affect appropriate.       Diet Orders (From admission, onward)     Start     Ordered   12/06/23 2353  Diet renal with fluid restriction Fluid restriction: 1200 mL Fluid; Room service appropriate? Yes; Fluid consistency: Thin  Diet effective now       Question Answer Comment  Fluid restriction: 1200 mL Fluid   Room service appropriate? Yes   Fluid consistency: Thin      12/06/23 2352            Objective: Vitals:   12/07/23 0041 12/07/23 0107 12/07/23 0324 12/07/23 0823  BP: (!) 124/54  (!) 126/57 (!) 150/69  Pulse: 100  92 92  Resp: 18  18 20   Temp: 99.5 F (37.5 C)  98.9 F (37.2 C) 98 F (36.7 C)  TempSrc: Oral  Oral   SpO2: 96%  95% 98%  Weight:  68 kg    Height:  5' 2 (1.575 m)      Intake/Output Summary (Last 24 hours) at 12/07/2023 0939 Last data filed at 12/06/2023 2315 Gross per 24 hour  Intake 700 ml  Output --  Net 700 ml   Filed Weights   12/07/23 0107  Weight: 68 kg    Scheduled Meds:  anastrozole   1 mg Oral Daily   atorvastatin   80 mg Oral q AM   febuxostat   40 mg Oral Daily   metoprolol  succinate  50 mg Oral QPM   pantoprazole   40 mg Oral Daily   rOPINIRole   0.5 mg Oral BID    sevelamer  carbonate  1,600 mg Oral TID WC   Continuous Infusions:  Nutritional status     Body mass index is 27.42 kg/m.  Data Reviewed:   CBC: Recent Labs  Lab 12/06/23 1824 12/07/23 0311  WBC 14.1* 12.0*  NEUTROABS 12.0*  --   HGB 10.0* 9.1*  HCT 30.0* 26.9*  MCV 93.8 94.7  PLT 266 239   Basic Metabolic Panel: Recent Labs  Lab 12/06/23 1824 12/07/23 0311  NA 139 139  K 3.2* 3.5  CL 96* 99  CO2 28 26  GLUCOSE 109* 140*  BUN 12 18  CREATININE 2.92* 3.71*  CALCIUM  9.4 8.9  MG  --  1.8   GFR: Estimated Creatinine Clearance: 11.1 mL/min (A) (by C-G formula based on SCr of 3.71 mg/dL (H)). Liver Function Tests: Recent Labs  Lab 12/06/23 1824  AST 20  ALT 14  ALKPHOS 83  BILITOT 0.7  PROT 6.4*  ALBUMIN 2.8*   No results for input(s): LIPASE, AMYLASE in the last 168 hours. No results for input(s):  AMMONIA in the last 168 hours. Coagulation Profile: No results for input(s): INR, PROTIME in the last 168 hours. Cardiac Enzymes: No results for input(s): CKTOTAL, CKMB, CKMBINDEX, TROPONINI in the last 168 hours. BNP (last 3 results) No results for input(s): PROBNP in the last 8760 hours. HbA1C: No results for input(s): HGBA1C in the last 72 hours. CBG: Recent Labs  Lab 12/06/23 1811  GLUCAP 97   Lipid Profile: No results for input(s): CHOL, HDL, LDLCALC, TRIG, CHOLHDL, LDLDIRECT in the last 72 hours. Thyroid  Function Tests: No results for input(s): TSH, T4TOTAL, FREET4, T3FREE, THYROIDAB in the last 72 hours. Anemia Panel: No results for input(s): VITAMINB12, FOLATE, FERRITIN, TIBC, IRON, RETICCTPCT in the last 72 hours. Sepsis Labs: No results for input(s): PROCALCITON, LATICACIDVEN in the last 168 hours.  No results found for this or any previous visit (from the past 240 hours).       Radiology Studies: CT HEAD WO CONTRAST ( ) Result Date: 12/07/2023 CLINICAL DATA:  Follow-up  subarachnoid hemorrhage EXAM: CT HEAD WITHOUT CONTRAST TECHNIQUE: Contiguous axial images were obtained from the base of the skull through the vertex without intravenous contrast. RADIATION DOSE REDUCTION: This exam was performed according to the departmental dose-optimization program which includes automated exposure control, adjustment of the mA and/or kV according to patient size and/or use of iterative reconstruction technique. COMPARISON:  CT from the previous day. FINDINGS: Brain: Previously seen hyperdense focus along the left sylvian fissure is less well visualized on current exam. No new focal area of hemorrhage is noted. Atrophic changes and chronic white matter ischemic changes are seen. Vascular: No hyperdense vessel or unexpected calcification. Skull: Normal. Negative for fracture or focal lesion. Sinuses/Orbits: No acute finding. Other: None. IMPRESSION: Previously seen hyperdense focus along the left sylvian fissure is less well visualized on this exam. No new or increasing hemorrhage is seen. Electronically Signed   By: Oneil Devonshire M.D.   On: 12/07/2023 00:29   MR ANGIO HEAD WO CONTRAST Result Date: 12/06/2023 CLINICAL DATA:  Follow-up examination for stroke. EXAM: MRA NECK WITHOUT CONTRAST MRA HEAD WITHOUT CONTRAST TECHNIQUE: Angiographic images of the Circle of Willis were acquired using MRA technique without intravenous contrast. COMPARISON:  Prior brain MRI from earlier the same day. FINDINGS: MRA NECK FINDINGS Aortic arch: Examination technically limited by motion and lack of IV contrast. Visualized aortic arch within normal limits for caliber with standard branch pattern. No visible stenosis or other abnormality about the origin the great vessels. Right carotid system: Right common and internal carotid arteries are mildly tortuous but patent with antegrade flow. No visible dissection. Mild atheromatous irregularity about the right carotid bulb without hemodynamically significant greater  than 50% stenosis. Left carotid system: Left common and internal carotid arteries are tortuous but patent with antegrade flow. No visible dissection. Atheromatous irregularity about the left carotid bulb without hemodynamically significant greater than 50% stenosis. Vertebral arteries: Left vertebral artery dominant and appears to arise directly from the aortic arch. Left vertebral artery patent with antegrade flow without visible stenosis or dissection. Right vertebral artery patent at its origin, but it becomes increasingly attenuated as it courses cephalad within the neck, possibly occluding at the skull base (series 16, image 257). Other: None. MRA HEAD FINDINGS Anterior circulation: Examination degraded by motion artifact. Moderate atheromatous irregularity about the carotid siphons without convincing hemodynamically significant stenosis. A1 segments patent bilaterally. Normal anterior communicating artery complex. Atheromatous irregularity about the ACAs with associated severe distal right A3 stenosis (series 1054, image 11). ACAs remain  patent to their distal aspects. Right M1 segment widely patent. Focal moderate distal left M1 stenosis (series 1054, image 11). Severe proximal left M2 stenosis, inferior division (series 6, image 111). MCA branches otherwise well perfused bilaterally. Posterior circulation: Dominant left V4 segment patent without stenosis. Left PICA patent. Visualized distal right V4 segment is patent and perfused, possibly retrograde in nature given the findings on corresponding MRA of the neck. Associated moderate to severe multifocal distal right V4 stenoses (series 1033, image 10). Right PICA patent as well. Basilar patent without stenosis. Superior cerebral arteries patent bilaterally. Left PCA supplied via the basilar. Fetal type origin of the right PCA. PCAs mildly irregular but patent to their distal aspects without significant stenosis. Anatomic variants: Fetal type right PCA. Other:  No visible intracranial aneurysm on this motion degraded exam. IMPRESSION: MRA HEAD: 1. Motion degraded exam. 2. Negative intracranial MRA for large vessel occlusion. 3. Moderate intracranial atherosclerotic disease, with most notable findings including moderate to severe left M1 and M2 stenoses, distal right A3 stenosis, and distal right V4 stenoses. MRA NECK: 1. Increasing attenuation of the right vertebral artery as it courses cephalad in the neck, potentially including at the skull base. Dominant left vertebral artery widely patent. 2. Atheromatous irregularity about the carotid bifurcations without hemodynamically significant greater than 50% stenosis. Electronically Signed   By: Morene Hoard M.D.   On: 12/06/2023 23:45   MR ANGIO NECK WO CONTRAST Result Date: 12/06/2023 CLINICAL DATA:  Follow-up examination for stroke. EXAM: MRA NECK WITHOUT CONTRAST MRA HEAD WITHOUT CONTRAST TECHNIQUE: Angiographic images of the Circle of Willis were acquired using MRA technique without intravenous contrast. COMPARISON:  Prior brain MRI from earlier the same day. FINDINGS: MRA NECK FINDINGS Aortic arch: Examination technically limited by motion and lack of IV contrast. Visualized aortic arch within normal limits for caliber with standard branch pattern. No visible stenosis or other abnormality about the origin the great vessels. Right carotid system: Right common and internal carotid arteries are mildly tortuous but patent with antegrade flow. No visible dissection. Mild atheromatous irregularity about the right carotid bulb without hemodynamically significant greater than 50% stenosis. Left carotid system: Left common and internal carotid arteries are tortuous but patent with antegrade flow. No visible dissection. Atheromatous irregularity about the left carotid bulb without hemodynamically significant greater than 50% stenosis. Vertebral arteries: Left vertebral artery dominant and appears to arise directly from  the aortic arch. Left vertebral artery patent with antegrade flow without visible stenosis or dissection. Right vertebral artery patent at its origin, but it becomes increasingly attenuated as it courses cephalad within the neck, possibly occluding at the skull base (series 16, image 257). Other: None. MRA HEAD FINDINGS Anterior circulation: Examination degraded by motion artifact. Moderate atheromatous irregularity about the carotid siphons without convincing hemodynamically significant stenosis. A1 segments patent bilaterally. Normal anterior communicating artery complex. Atheromatous irregularity about the ACAs with associated severe distal right A3 stenosis (series 1054, image 11). ACAs remain patent to their distal aspects. Right M1 segment widely patent. Focal moderate distal left M1 stenosis (series 1054, image 11). Severe proximal left M2 stenosis, inferior division (series 6, image 111). MCA branches otherwise well perfused bilaterally. Posterior circulation: Dominant left V4 segment patent without stenosis. Left PICA patent. Visualized distal right V4 segment is patent and perfused, possibly retrograde in nature given the findings on corresponding MRA of the neck. Associated moderate to severe multifocal distal right V4 stenoses (series 1033, image 10). Right PICA patent as well. Basilar patent without stenosis.  Superior cerebral arteries patent bilaterally. Left PCA supplied via the basilar. Fetal type origin of the right PCA. PCAs mildly irregular but patent to their distal aspects without significant stenosis. Anatomic variants: Fetal type right PCA. Other: No visible intracranial aneurysm on this motion degraded exam. IMPRESSION: MRA HEAD: 1. Motion degraded exam. 2. Negative intracranial MRA for large vessel occlusion. 3. Moderate intracranial atherosclerotic disease, with most notable findings including moderate to severe left M1 and M2 stenoses, distal right A3 stenosis, and distal right V4  stenoses. MRA NECK: 1. Increasing attenuation of the right vertebral artery as it courses cephalad in the neck, potentially including at the skull base. Dominant left vertebral artery widely patent. 2. Atheromatous irregularity about the carotid bifurcations without hemodynamically significant greater than 50% stenosis. Electronically Signed   By: Morene Hoard M.D.   On: 12/06/2023 23:45   MR BRAIN WO CONTRAST Result Date: 12/06/2023 CLINICAL DATA:  Initial evaluation for acute neuro deficit, stroke suspected. EXAM: MRI HEAD WITHOUT CONTRAST TECHNIQUE: Multiplanar, multiecho pulse sequences of the brain and surrounding structures were obtained without intravenous contrast. COMPARISON:  CT from earlier the same day. FINDINGS: Brain: Mild diffuse prominence of the CSF containing spaces compatible with generalized cerebral atrophy. Patchy and confluent T2/FLAIR hyperintensity involving the periventricular deep white matter both cerebral hemispheres as well as the pons, consistent with chronic small vessel ischemic disease, moderate in nature. Few small remote lacunar infarcts present about the bilateral basal ganglia, right thalamus, and left cerebellum. Single punctate focus of diffusion signal abnormality seen involving the right occipital cortex, suspicious for a tiny acute ischemic nonhemorrhagic infarct (series 2, image 25). No other evidence for acute or subacute ischemia. Gray-white matter differentiation otherwise maintained. No other acute or significant chronic intracranial blood products. No mass lesion, midline shift or mass effect no hydrocephalus or extra-axial fluid collection. Pituitary gland within normal limits. Vascular: Right vertebral artery hypoplastic and not well seen. Major intracranial vascular flow voids are otherwise maintained. Skull and upper cervical spine: Craniocervical junction within normal limits. Bone marrow signal intensity normal. Probable small soft tissue contusion  noted at the left occipital scalp (series 2, image 31). Sinuses/Orbits: Prior bilateral ocular lens replacement. Mild chronic mucosal thickening present about the left maxillary sinus. Paranasal sinuses are otherwise largely clear. Small left mastoid effusion noted, of doubtful significance. Other: None. IMPRESSION: 1. Single punctate focus of diffusion signal abnormality involving the right occipital cortex, suspicious for a tiny acute ischemic nonhemorrhagic infarct. 2. No other acute intracranial abnormality. 3. Underlying age-related cerebral atrophy with moderate chronic microvascular ischemic disease, with a few scattered remote lacunar infarcts as above. 4. Small soft tissue contusion at the left occipital scalp. Electronically Signed   By: Morene Hoard M.D.   On: 12/06/2023 21:16   DG Chest 1 View Result Date: 12/06/2023 CLINICAL DATA:  106001 Syncope 106001 EXAM: CHEST  1 VIEW COMPARISON:  October 23, 2023 FINDINGS: Low lung volumes with bilateral perihilar interstitial opacities. Patchy airspace opacities in the retrocardiac left lung base. Elevation of the right hemidiaphragm. No pneumothorax or pleural effusion. Mild cardiomegaly.No acute fracture or destructive lesion. IMPRESSION: 1. Bilateral perihilar interstitial opacities, which may represent bronchovascular crowding due to low lung volumes, interstitial edema, or atypical/viral infection. 2. Patchy opacities in the retrocardiac left lung base, likely atelectasis. Alternatively, a developing bronchopneumonia could have this appearance in the correct clinical context. Electronically Signed   By: Rogelia Myers M.D.   On: 12/06/2023 19:09   CT HEAD WO CONTRAST Addendum Date:  12/06/2023 ADDENDUM REPORT: 12/06/2023 18:24 ADDENDUM: These results were called by telephone at the time of interpretation on 12/06/2023 at 6:24 pm to provider JULIE HAVILAND , who verbally acknowledged these results. Electronically Signed   By: Morgane  Naveau M.D.    On: 12/06/2023 18:24   Result Date: 12/06/2023 CLINICAL DATA:  Polytrauma, blunt EXAM: CT HEAD WITHOUT CONTRAST CT CERVICAL SPINE WITHOUT CONTRAST TECHNIQUE: Multidetector CT imaging of the head and cervical spine was performed following the standard protocol without intravenous contrast. Multiplanar CT image reconstructions of the cervical spine were also generated. RADIATION DOSE REDUCTION: This exam was performed according to the departmental dose-optimization program which includes automated exposure control, adjustment of the mA and/or kV according to patient size and/or use of iterative reconstruction technique. COMPARISON:  None Available. FINDINGS: CT HEAD FINDINGS Brain: Cerebral ventricle sizes are concordant with the degree of cerebral volume loss. Patchy and confluent areas of decreased attenuation are noted throughout the deep and periventricular white matter of the cerebral hemispheres bilaterally, compatible with chronic microvascular ischemic disease. No evidence of large-territorial acute infarction. 5 mm curvilinear hyperdensity along the left sylvian fissure best noted on sagittal view (5:11). No parenchymal hemorrhage. No mass lesion.No mass effect or midline shift. No hydrocephalus. Basilar cisterns are patent. Vascular: No hyperdense vessel. Skull: No acute fracture or focal lesion. Sinuses/Orbits: Paranasal sinuses and mastoid air cells are clear. The orbits are unremarkable. Other: None. CT CERVICAL SPINE FINDINGS Alignment: Normal. Skull base and vertebrae: Multilevel mild-to-moderate degenerative change of the spine most prominent at the C4-C6 levels. Associated severe osseous neural foraminal stenosis at the left C5-C6 level. No acute fracture. No aggressive appearing focal osseous lesion or focal pathologic process. Soft tissues and spinal canal: No prevertebral fluid or swelling. No visible canal hematoma. Upper chest: Unremarkable. Other: Atherosclerotic plaque of the carotid arteries  within the neck. IMPRESSION: 1. A 5 mm curvilinear hyperdensity along the left sylvian fissure may represent a tiny subarachnoid hemorrhage. 2. No acute displaced fracture or traumatic listhesis of the cervical spine. Electronically Signed: By: Morgane  Naveau M.D. On: 12/06/2023 18:20   CT Cervical Spine Wo Contrast Addendum Date: 12/06/2023 ADDENDUM REPORT: 12/06/2023 18:24 ADDENDUM: These results were called by telephone at the time of interpretation on 12/06/2023 at 6:24 pm to provider JULIE HAVILAND , who verbally acknowledged these results. Electronically Signed   By: Morgane  Naveau M.D.   On: 12/06/2023 18:24   Result Date: 12/06/2023 CLINICAL DATA:  Polytrauma, blunt EXAM: CT HEAD WITHOUT CONTRAST CT CERVICAL SPINE WITHOUT CONTRAST TECHNIQUE: Multidetector CT imaging of the head and cervical spine was performed following the standard protocol without intravenous contrast. Multiplanar CT image reconstructions of the cervical spine were also generated. RADIATION DOSE REDUCTION: This exam was performed according to the departmental dose-optimization program which includes automated exposure control, adjustment of the mA and/or kV according to patient size and/or use of iterative reconstruction technique. COMPARISON:  None Available. FINDINGS: CT HEAD FINDINGS Brain: Cerebral ventricle sizes are concordant with the degree of cerebral volume loss. Patchy and confluent areas of decreased attenuation are noted throughout the deep and periventricular white matter of the cerebral hemispheres bilaterally, compatible with chronic microvascular ischemic disease. No evidence of large-territorial acute infarction. 5 mm curvilinear hyperdensity along the left sylvian fissure best noted on sagittal view (5:11). No parenchymal hemorrhage. No mass lesion.No mass effect or midline shift. No hydrocephalus. Basilar cisterns are patent. Vascular: No hyperdense vessel. Skull: No acute fracture or focal lesion. Sinuses/Orbits:  Paranasal sinuses and mastoid  air cells are clear. The orbits are unremarkable. Other: None. CT CERVICAL SPINE FINDINGS Alignment: Normal. Skull base and vertebrae: Multilevel mild-to-moderate degenerative change of the spine most prominent at the C4-C6 levels. Associated severe osseous neural foraminal stenosis at the left C5-C6 level. No acute fracture. No aggressive appearing focal osseous lesion or focal pathologic process. Soft tissues and spinal canal: No prevertebral fluid or swelling. No visible canal hematoma. Upper chest: Unremarkable. Other: Atherosclerotic plaque of the carotid arteries within the neck. IMPRESSION: 1. A 5 mm curvilinear hyperdensity along the left sylvian fissure may represent a tiny subarachnoid hemorrhage. 2. No acute displaced fracture or traumatic listhesis of the cervical spine. Electronically Signed: By: Morgane  Naveau M.D. On: 12/06/2023 18:20        LOS: 0 days   Time spent= 41 mins    Deliliah Room, MD [facility]  If 7PM-7AM, please contact night-coverage  12/07/2023, 9:39 AM

## 2023-12-08 DIAGNOSIS — I609 Nontraumatic subarachnoid hemorrhage, unspecified: Secondary | ICD-10-CM | POA: Diagnosis not present

## 2023-12-08 DIAGNOSIS — N186 End stage renal disease: Secondary | ICD-10-CM | POA: Diagnosis not present

## 2023-12-08 NOTE — Discharge Planning (Signed)
 [facility] Patient Discharge Orders - [facility] CL[facility]: [place]  Patient's name: Rachel Villarreal Admit/DC Dates: 12/06/2023 - 12/08/23  DISCHARGE DIAGNOSES: Subarachnoid hemorrhage  Acute ischemic non-hemorrhagic CVA  HD ORDER CHANGES: Heparin  change: YES -> HOLD HEPARIN  DUE TO BRAIN BLEED EDW Change: no Bath Change: no, but monitor K  ANEMIA MANAGEMENT: Aranesp: Given: no  ESA dose for discharge: same as prior IV Iron dose at discharge: per protocol Transfusion: Given: no  BONE/MINERAL MEDICATIONS: Hectorol/Calcitriol change: no Sensipar/Parsabiv change: no  ACCESS INTERVENTION/CHANGE: no Details:  RECENT LABS: Recent Labs  Lab 12/06/23 1824 12/07/23 0311  HGB 10.0* 9.1*  NA 139 139  K 3.2* 3.5  CALCIUM  9.4 8.9  ALBUMIN  2.8*  --    IV ANTIBIOTICS: no Details:  OTHER ANTICOAGULATION: Eliquis  being held  OTHER/APPTS/LAB ORDERS:   D/C Meds to be reconciled by nurse after every discharge.  Completed By: Izetta Boehringer, PA-C [facility] Pager [phone]   Reviewed by: MD:______ RN_______

## 2023-12-08 NOTE — TOC Transition Note (Signed)
 Transition of Care [facility]) - Discharge Note   Patient Details  Name: Rachel Villarreal MRN: [id number] Date of Birth: July 30, 1945  Transition of Care [facility]) CM/SW Contact:  Marval Gell, RN Phone Number: 12/08/2023, 8:24 AM   Clinical Narrative:      Referral placed for OP PT. AVS updated        Patient Goals and CMS Choice            Discharge Placement                       Discharge Plan and Services Additional resources added to the After Visit Summary for                                       Social Drivers of Health (SDOH) Interventions SDOH Screenings   Food Insecurity: No Food Insecurity (04/16/2023)  Housing: Low Risk  (04/16/2023)  Transportation Needs: No Transportation Needs (04/16/2023)  Utilities: Not At Risk (04/16/2023)  Depression (PHQ2-9): Low Risk  (02/28/2022)  Financial Resource Strain: Low Risk  (09/02/2021)  Social Connections: Moderately Integrated (04/16/2023)  Stress: No Stress Concern Present (09/02/2021)  Tobacco Use: Medium Risk (12/07/2023)     Readmission Risk Interventions     No data to display

## 2023-12-08 NOTE — Discharge Summary (Signed)
 Physician Discharge Summary   Patient: Rachel Villarreal: [id number] DOB: Jan 05, 1946  Admit date:     12/06/2023  Discharge date: 12/08/23  Discharge Physician: Deliliah Room   PCP: Ransom Other, MD   Recommendations at discharge:   Follow up with your PCP in one week Follow up with neurosurgery in 2-3 weeks. Call to make an appointment. Stop taking eliquis . Decision to restart eliquis  will be up to your PCP or neurosurgeon. Return to ED if you develop stroke like symptoms, blurry vision or headaches. HD as per schedule   Discharge Diagnoses: Principal Problem:   SAH (subarachnoid hemorrhage) (HCC) Active Problems:   Acute ischemic stroke (HCC)   Paroxysmal atrial fibrillation (HCC)   ESRD on dialysis (HCC)   Pure hypercholesterolemia   Scalp laceration, initial encounter   Anemia of chronic renal failure   Coronary artery disease  Hospital Course:  Subarachnoid hemorrhage,POA: Patient presenting after head injury after fall at home.   CT head shows a 5 mm curvilinear hyperdensity along the left sylvian fissure which may represent a tiny SAH.  EDP spoke with neurosurgery who recommended repeat CT head in 12 hours - Holding Eliquis . Decision to restart will be up to PCP or neurosurgeon. - Repeat CT head didn't show any new or increasing hemorrhage.   Acute ischemic nonhemorrhagic infarct involving right occipital cortex,POA: Likely incidental finding on MRI brain.  Neurology recommended MRA head and neck but otherwise no further workup as patient is not a candidate for antiplatelets at this time.   - MRA Head: Negative intracranial MRA for large vessel occlusion,Moderate intracranial atherosclerotic disease, with most notable findings including moderate to severe left M1 and M2 stenoses, distal right A3 stenosis, and distal right V4 stenoses.   -MRA Neck:Increasing attenuation of the right vertebral artery as it courses cephalad in the neck, potentially including at the  skull base. Dominant left vertebral artery widely patent,  Atheromatous irregularity about the carotid bifurcations without hemodynamically  significant greater than 50% stenosis. - Holding Eliquis  as above - PT/OT eval done.   Paroxysmal atrial fibrillation: Continue Toprol -XL.  Holding Eliquis .   ESRD on TTS HD: Last HD was on 8/28. Discussed with Dr Melia and patient will have out patient HD today (8/30 at 10 am).   Recurrent postdialysis dizziness with fall at home: Suspect she is getting hypotensive or hypovolemic after dialysis.  Consider addition of midodrine  on dialysis days if getting hypotensive.  PT/OT eval.   Left posterior scalp laceration s/p lac repair in the ED: Will need follow-up for suture removal in 7-10 days.   Coronary artery disease: Stable, denies chest pain.  Has not been on antiplatelets as she has been on Eliquis  which is currently on hold as above.  Continue atorvastatin .   Anemia of ESRD: Hemoglobin stable   Hypokalemia: Prn repletion   Disposition: Lives at home by herself and is IADL.       Consultants: Neurology, Neurosurgery Procedures performed: none  Disposition: Home Diet recommendation:  Renal diet DISCHARGE MEDICATION: Allergies as of 12/08/2023       Reactions   Ace Inhibitors    angioedema   Lactose Intolerance (gi) Other (See Comments)   GI upset   Letrozole  Hives   Lisinopril Swelling   Mercury    Red eyes, through eye drops that contained thimerosal    Tape Dermatitis   plastic        Medication List     STOP taking these medications    apixaban   5 MG Tabs tablet Commonly known as: ELIQUIS        TAKE these medications    acetaminophen  500 MG tablet Commonly known as: TYLENOL  Take 500 mg by mouth every 6 (six) hours as needed for mild pain (pain score 1-3).   anastrozole  1 MG tablet Commonly known as: ARIMIDEX  TAKE 1 TABLET BY MOUTH EVERY DAY   atorvastatin  80 MG tablet Commonly known as: LIPITOR  Take  1 tablet (80 mg total) by mouth in the morning.   budesonide  32 MCG/ACT nasal spray Commonly known as: RHINOCORT  AQUA Place 2 sprays into both nostrils in the morning.   Cholecalciferol 50 MCG (2000 UT) Caps Take 2,000 Units by mouth in the morning.   Claritin  10 MG tablet Generic drug: loratadine  Take 10 mg by mouth 2 (two) times daily.   docusate sodium  100 MG capsule Commonly known as: COLACE Take 100 mg by mouth daily as needed for mild constipation.   eszopiclone 2 MG Tabs tablet Commonly known as: LUNESTA Take 2 mg by mouth at bedtime.   febuxostat  40 MG tablet Commonly known as: ULORIC  Take 40 mg by mouth in the morning.   furosemide  80 MG tablet Commonly known as: LASIX  Take 80 mg by mouth See admin instructions. Take 1 tablet by mouth on non-dialysis days, Mon, Wed, Fri, Sun   metoprolol  succinate 100 MG 24 hr tablet Commonly known as: TOPROL -XL Take 0.5-1 tablet (50-100 mg total) by mouth daily as needed for elevated heart rate. Take with or immediately following a meal. What changed:  how much to take when to take this   MIRCERA IJ 60 mcg.   ondansetron  4 MG tablet Commonly known as: ZOFRAN  Take 4 mg by mouth See admin instructions. Take 1 tablet by mouth on dialysis days, during dialysis   pantoprazole  40 MG tablet Commonly known as: PROTONIX  Take 1 tablet (40 mg total) by mouth daily.   PRESERVISION AREDS 2+MULTI VIT PO Take 1 capsule by mouth 2 (two) times daily.   ProAir  HFA 108 (90 Base) MCG/ACT inhaler Generic drug: albuterol  Inhale 1-2 puffs into the lungs every 6 (six) hours as needed for shortness of breath or wheezing (for respiratory issues.).   rOPINIRole  0.5 MG tablet Commonly known as: REQUIP  Take 1 tablet (0.5 mg total) by mouth every 12 (twelve) hours as needed (Restless legs). What changed: when to take this   sevelamer  carbonate 800 MG tablet Commonly known as: RENVELA  Take 1,600 mg by mouth 3 (three) times daily with meals.    Voltaren  1 % Gel Generic drug: diclofenac  Sodium Apply 1 Application topically 4 (four) times daily as needed (pain.).        Follow-up Information     Ransom Other, MD. Schedule an appointment as soon as possible for a visit in 1 week(s).   Specialty: Internal Medicine Contact information: [address] [place] [address] [phone]         [facility]. Schedule an appointment as soon as possible for a visit in 2 week(s).   Specialty: Neurosurgery Contact information: [address] [place] [address] [phone]                Discharge Exam: Rachel Villarreal   12/07/23 0107  Weight: 68 kg   Constitutional: NAD, calm, comfortable Eyes: PERRL, lids and conjunctivae normal ENMT: Mucous membranes are moist. Posterior pharynx clear of any exudate or lesions.Normal dentition.  Neck: normal, supple, no masses, no  thyromegaly Respiratory: clear to auscultation bilaterally, no wheezing, no crackles. Normal respiratory effort. No accessory muscle use.  Cardiovascular: Regular rate and rhythm, no murmurs / rubs / gallops. No extremity edema. 2+ pedal pulses. No carotid bruits.  Abdomen: no tenderness, no masses palpated. No hepatosplenomegaly. Bowel sounds positive.  Musculoskeletal: no clubbing / cyanosis. No joint deformity upper and lower extremities. Good ROM, no contractures. Normal muscle tone.  Skin: no rashes, lesions, ulcers. No induration Neurologic: CN 2-12 grossly intact. Sensation intact, DTR normal. Strength 5/5 x all 4 extremities.  Psychiatric: Normal judgment and insight. Alert and oriented x 3. Normal mood.    Condition at discharge: good  The results of significant diagnostics from this hospitalization (including imaging, microbiology, ancillary and laboratory) are listed below for reference.   Imaging Studies: CT HEAD WO CONTRAST ( ) Result Date: 12/07/2023 CLINICAL DATA:   Follow-up subarachnoid hemorrhage EXAM: CT HEAD WITHOUT CONTRAST TECHNIQUE: Contiguous axial images were obtained from the base of the skull through the vertex without intravenous contrast. RADIATION DOSE REDUCTION: This exam was performed according to the departmental dose-optimization program which includes automated exposure control, adjustment of the mA and/or kV according to patient size and/or use of iterative reconstruction technique. COMPARISON:  CT from the previous day. FINDINGS: Brain: Previously seen hyperdense focus along the left sylvian fissure is less well visualized on current exam. No new focal area of hemorrhage is noted. Atrophic changes and chronic white matter ischemic changes are seen. Vascular: No hyperdense vessel or unexpected calcification. Skull: Normal. Negative for fracture or focal lesion. Sinuses/Orbits: No acute finding. Other: None. IMPRESSION: Previously seen hyperdense focus along the left sylvian fissure is less well visualized on this exam. No new or increasing hemorrhage is seen. Electronically Signed   By: Oneil Devonshire M.D.   On: 12/07/2023 00:29   MR ANGIO HEAD WO CONTRAST Result Date: 12/06/2023 CLINICAL DATA:  Follow-up examination for stroke. EXAM: MRA NECK WITHOUT CONTRAST MRA HEAD WITHOUT CONTRAST TECHNIQUE: Angiographic images of the Circle of Willis were acquired using MRA technique without intravenous contrast. COMPARISON:  Prior brain MRI from earlier the same day. FINDINGS: MRA NECK FINDINGS Aortic arch: Examination technically limited by motion and lack of IV contrast. Visualized aortic arch within normal limits for caliber with standard branch pattern. No visible stenosis or other abnormality about the origin the great vessels. Right carotid system: Right common and internal carotid arteries are mildly tortuous but patent with antegrade flow. No visible dissection. Mild atheromatous irregularity about the right carotid bulb without hemodynamically significant  greater than 50% stenosis. Left carotid system: Left common and internal carotid arteries are tortuous but patent with antegrade flow. No visible dissection. Atheromatous irregularity about the left carotid bulb without hemodynamically significant greater than 50% stenosis. Vertebral arteries: Left vertebral artery dominant and appears to arise directly from the aortic arch. Left vertebral artery patent with antegrade flow without visible stenosis or dissection. Right vertebral artery patent at its origin, but it becomes increasingly attenuated as it courses cephalad within the neck, possibly occluding at the skull base (series 16, image 257). Other: None. MRA HEAD FINDINGS Anterior circulation: Examination degraded by motion artifact. Moderate atheromatous irregularity about the carotid siphons without convincing hemodynamically significant stenosis. A1 segments patent bilaterally. Normal anterior communicating artery complex. Atheromatous irregularity about the ACAs with associated severe distal right A3 stenosis (series 1054, image 11). ACAs remain patent to their distal aspects. Right M1 segment widely patent. Focal moderate distal left M1 stenosis (series 1054, image 11). Severe  proximal left M2 stenosis, inferior division (series 6, image 111). MCA branches otherwise well perfused bilaterally. Posterior circulation: Dominant left V4 segment patent without stenosis. Left PICA patent. Visualized distal right V4 segment is patent and perfused, possibly retrograde in nature given the findings on corresponding MRA of the neck. Associated moderate to severe multifocal distal right V4 stenoses (series 1033, image 10). Right PICA patent as well. Basilar patent without stenosis. Superior cerebral arteries patent bilaterally. Left PCA supplied via the basilar. Fetal type origin of the right PCA. PCAs mildly irregular but patent to their distal aspects without significant stenosis. Anatomic variants: Fetal type right  PCA. Other: No visible intracranial aneurysm on this motion degraded exam. IMPRESSION: MRA HEAD: 1. Motion degraded exam. 2. Negative intracranial MRA for large vessel occlusion. 3. Moderate intracranial atherosclerotic disease, with most notable findings including moderate to severe left M1 and M2 stenoses, distal right A3 stenosis, and distal right V4 stenoses. MRA NECK: 1. Increasing attenuation of the right vertebral artery as it courses cephalad in the neck, potentially including at the skull base. Dominant left vertebral artery widely patent. 2. Atheromatous irregularity about the carotid bifurcations without hemodynamically significant greater than 50% stenosis. Electronically Signed   By: Morene Hoard M.D.   On: 12/06/2023 23:45   MR ANGIO NECK WO CONTRAST Result Date: 12/06/2023 CLINICAL DATA:  Follow-up examination for stroke. EXAM: MRA NECK WITHOUT CONTRAST MRA HEAD WITHOUT CONTRAST TECHNIQUE: Angiographic images of the Circle of Willis were acquired using MRA technique without intravenous contrast. COMPARISON:  Prior brain MRI from earlier the same day. FINDINGS: MRA NECK FINDINGS Aortic arch: Examination technically limited by motion and lack of IV contrast. Visualized aortic arch within normal limits for caliber with standard branch pattern. No visible stenosis or other abnormality about the origin the great vessels. Right carotid system: Right common and internal carotid arteries are mildly tortuous but patent with antegrade flow. No visible dissection. Mild atheromatous irregularity about the right carotid bulb without hemodynamically significant greater than 50% stenosis. Left carotid system: Left common and internal carotid arteries are tortuous but patent with antegrade flow. No visible dissection. Atheromatous irregularity about the left carotid bulb without hemodynamically significant greater than 50% stenosis. Vertebral arteries: Left vertebral artery dominant and appears to arise  directly from the aortic arch. Left vertebral artery patent with antegrade flow without visible stenosis or dissection. Right vertebral artery patent at its origin, but it becomes increasingly attenuated as it courses cephalad within the neck, possibly occluding at the skull base (series 16, image 257). Other: None. MRA HEAD FINDINGS Anterior circulation: Examination degraded by motion artifact. Moderate atheromatous irregularity about the carotid siphons without convincing hemodynamically significant stenosis. A1 segments patent bilaterally. Normal anterior communicating artery complex. Atheromatous irregularity about the ACAs with associated severe distal right A3 stenosis (series 1054, image 11). ACAs remain patent to their distal aspects. Right M1 segment widely patent. Focal moderate distal left M1 stenosis (series 1054, image 11). Severe proximal left M2 stenosis, inferior division (series 6, image 111). MCA branches otherwise well perfused bilaterally. Posterior circulation: Dominant left V4 segment patent without stenosis. Left PICA patent. Visualized distal right V4 segment is patent and perfused, possibly retrograde in nature given the findings on corresponding MRA of the neck. Associated moderate to severe multifocal distal right V4 stenoses (series 1033, image 10). Right PICA patent as well. Basilar patent without stenosis. Superior cerebral arteries patent bilaterally. Left PCA supplied via the basilar. Fetal type origin of the right PCA. PCAs mildly irregular  but patent to their distal aspects without significant stenosis. Anatomic variants: Fetal type right PCA. Other: No visible intracranial aneurysm on this motion degraded exam. IMPRESSION: MRA HEAD: 1. Motion degraded exam. 2. Negative intracranial MRA for large vessel occlusion. 3. Moderate intracranial atherosclerotic disease, with most notable findings including moderate to severe left M1 and M2 stenoses, distal right A3 stenosis, and distal  right V4 stenoses. MRA NECK: 1. Increasing attenuation of the right vertebral artery as it courses cephalad in the neck, potentially including at the skull base. Dominant left vertebral artery widely patent. 2. Atheromatous irregularity about the carotid bifurcations without hemodynamically significant greater than 50% stenosis. Electronically Signed   By: Morene Hoard M.D.   On: 12/06/2023 23:45   MR BRAIN WO CONTRAST Result Date: 12/06/2023 CLINICAL DATA:  Initial evaluation for acute neuro deficit, stroke suspected. EXAM: MRI HEAD WITHOUT CONTRAST TECHNIQUE: Multiplanar, multiecho pulse sequences of the brain and surrounding structures were obtained without intravenous contrast. COMPARISON:  CT from earlier the same day. FINDINGS: Brain: Mild diffuse prominence of the CSF containing spaces compatible with generalized cerebral atrophy. Patchy and confluent T2/FLAIR hyperintensity involving the periventricular deep white matter both cerebral hemispheres as well as the pons, consistent with chronic small vessel ischemic disease, moderate in nature. Few small remote lacunar infarcts present about the bilateral basal ganglia, right thalamus, and left cerebellum. Single punctate focus of diffusion signal abnormality seen involving the right occipital cortex, suspicious for a tiny acute ischemic nonhemorrhagic infarct (series 2, image 25). No other evidence for acute or subacute ischemia. Gray-white matter differentiation otherwise maintained. No other acute or significant chronic intracranial blood products. No mass lesion, midline shift or mass effect no hydrocephalus or extra-axial fluid collection. Pituitary gland within normal limits. Vascular: Right vertebral artery hypoplastic and not well seen. Major intracranial vascular flow voids are otherwise maintained. Skull and upper cervical spine: Craniocervical junction within normal limits. Bone marrow signal intensity normal. Probable small soft tissue  contusion noted at the left occipital scalp (series 2, image 31). Sinuses/Orbits: Prior bilateral ocular lens replacement. Mild chronic mucosal thickening present about the left maxillary sinus. Paranasal sinuses are otherwise largely clear. Small left mastoid effusion noted, of doubtful significance. Other: None. IMPRESSION: 1. Single punctate focus of diffusion signal abnormality involving the right occipital cortex, suspicious for a tiny acute ischemic nonhemorrhagic infarct. 2. No other acute intracranial abnormality. 3. Underlying age-related cerebral atrophy with moderate chronic microvascular ischemic disease, with a few scattered remote lacunar infarcts as above. 4. Small soft tissue contusion at the left occipital scalp. Electronically Signed   By: Morene Hoard M.D.   On: 12/06/2023 21:16   DG Chest 1 View Result Date: 12/06/2023 CLINICAL DATA:  106001 Syncope 106001 EXAM: CHEST  1 VIEW COMPARISON:  October 23, 2023 FINDINGS: Low lung volumes with bilateral perihilar interstitial opacities. Patchy airspace opacities in the retrocardiac left lung base. Elevation of the right hemidiaphragm. No pneumothorax or pleural effusion. Mild cardiomegaly.No acute fracture or destructive lesion. IMPRESSION: 1. Bilateral perihilar interstitial opacities, which may represent bronchovascular crowding due to low lung volumes, interstitial edema, or atypical/viral infection. 2. Patchy opacities in the retrocardiac left lung base, likely atelectasis. Alternatively, a developing bronchopneumonia could have this appearance in the correct clinical context. Electronically Signed   By: Rogelia Myers M.D.   On: 12/06/2023 19:09   CT HEAD WO CONTRAST Addendum Date: 12/06/2023 ADDENDUM REPORT: 12/06/2023 18:24 ADDENDUM: These results were called by telephone at the time of interpretation on 12/06/2023 at 6:24  pm to provider JULIE HAVILAND , who verbally acknowledged these results. Electronically Signed   By: Morgane   Naveau M.D.   On: 12/06/2023 18:24   Result Date: 12/06/2023 CLINICAL DATA:  Polytrauma, blunt EXAM: CT HEAD WITHOUT CONTRAST CT CERVICAL SPINE WITHOUT CONTRAST TECHNIQUE: Multidetector CT imaging of the head and cervical spine was performed following the standard protocol without intravenous contrast. Multiplanar CT image reconstructions of the cervical spine were also generated. RADIATION DOSE REDUCTION: This exam was performed according to the departmental dose-optimization program which includes automated exposure control, adjustment of the mA and/or kV according to patient size and/or use of iterative reconstruction technique. COMPARISON:  None Available. FINDINGS: CT HEAD FINDINGS Brain: Cerebral ventricle sizes are concordant with the degree of cerebral volume loss. Patchy and confluent areas of decreased attenuation are noted throughout the deep and periventricular white matter of the cerebral hemispheres bilaterally, compatible with chronic microvascular ischemic disease. No evidence of large-territorial acute infarction. 5 mm curvilinear hyperdensity along the left sylvian fissure best noted on sagittal view (5:11). No parenchymal hemorrhage. No mass lesion.No mass effect or midline shift. No hydrocephalus. Basilar cisterns are patent. Vascular: No hyperdense vessel. Skull: No acute fracture or focal lesion. Sinuses/Orbits: Paranasal sinuses and mastoid air cells are clear. The orbits are unremarkable. Other: None. CT CERVICAL SPINE FINDINGS Alignment: Normal. Skull base and vertebrae: Multilevel mild-to-moderate degenerative change of the spine most prominent at the C4-C6 levels. Associated severe osseous neural foraminal stenosis at the left C5-C6 level. No acute fracture. No aggressive appearing focal osseous lesion or focal pathologic process. Soft tissues and spinal canal: No prevertebral fluid or swelling. No visible canal hematoma. Upper chest: Unremarkable. Other: Atherosclerotic plaque of the  carotid arteries within the neck. IMPRESSION: 1. A 5 mm curvilinear hyperdensity along the left sylvian fissure may represent a tiny subarachnoid hemorrhage. 2. No acute displaced fracture or traumatic listhesis of the cervical spine. Electronically Signed: By: Morgane  Naveau M.D. On: 12/06/2023 18:20   CT Cervical Spine Wo Contrast Addendum Date: 12/06/2023 ADDENDUM REPORT: 12/06/2023 18:24 ADDENDUM: These results were called by telephone at the time of interpretation on 12/06/2023 at 6:24 pm to provider JULIE HAVILAND , who verbally acknowledged these results. Electronically Signed   By: Morgane  Naveau M.D.   On: 12/06/2023 18:24   Result Date: 12/06/2023 CLINICAL DATA:  Polytrauma, blunt EXAM: CT HEAD WITHOUT CONTRAST CT CERVICAL SPINE WITHOUT CONTRAST TECHNIQUE: Multidetector CT imaging of the head and cervical spine was performed following the standard protocol without intravenous contrast. Multiplanar CT image reconstructions of the cervical spine were also generated. RADIATION DOSE REDUCTION: This exam was performed according to the departmental dose-optimization program which includes automated exposure control, adjustment of the mA and/or kV according to patient size and/or use of iterative reconstruction technique. COMPARISON:  None Available. FINDINGS: CT HEAD FINDINGS Brain: Cerebral ventricle sizes are concordant with the degree of cerebral volume loss. Patchy and confluent areas of decreased attenuation are noted throughout the deep and periventricular white matter of the cerebral hemispheres bilaterally, compatible with chronic microvascular ischemic disease. No evidence of large-territorial acute infarction. 5 mm curvilinear hyperdensity along the left sylvian fissure best noted on sagittal view (5:11). No parenchymal hemorrhage. No mass lesion.No mass effect or midline shift. No hydrocephalus. Basilar cisterns are patent. Vascular: No hyperdense vessel. Skull: No acute fracture or focal  lesion. Sinuses/Orbits: Paranasal sinuses and mastoid air cells are clear. The orbits are unremarkable. Other: None. CT CERVICAL SPINE FINDINGS Alignment: Normal. Skull base and vertebrae: Multilevel  mild-to-moderate degenerative change of the spine most prominent at the C4-C6 levels. Associated severe osseous neural foraminal stenosis at the left C5-C6 level. No acute fracture. No aggressive appearing focal osseous lesion or focal pathologic process. Soft tissues and spinal canal: No prevertebral fluid or swelling. No visible canal hematoma. Upper chest: Unremarkable. Other: Atherosclerotic plaque of the carotid arteries within the neck. IMPRESSION: 1. A 5 mm curvilinear hyperdensity along the left sylvian fissure may represent a tiny subarachnoid hemorrhage. 2. No acute displaced fracture or traumatic listhesis of the cervical spine. Electronically Signed: By: Morgane  Naveau M.D. On: 12/06/2023 18:20   CT CHEST ABDOMEN PELVIS WO CONTRAST Result Date: 11/26/2023 CLINICAL DATA:  Breast cancer, invasive, stage IV, assess treatment response Met breast cancer restaging. * Tracking Code: BO * EXAM: CT CHEST, ABDOMEN AND PELVIS WITHOUT CONTRAST TECHNIQUE: Multidetector CT imaging of the chest, abdomen and pelvis was performed following the standard protocol without IV contrast. RADIATION DOSE REDUCTION: This exam was performed according to the departmental dose-optimization program which includes automated exposure control, adjustment of the mA and/or kV according to patient size and/or use of iterative reconstruction technique. COMPARISON:  CT scan chest, abdomen and pelvis from 05/16/2023. FINDINGS: CT CHEST FINDINGS Cardiovascular: Normal cardiac size. No pericardial effusion. There is apparent hypoattenuation of the blood pool relative to the myocardium, suggestive of anemia. No aortic aneurysm. There are coronary artery calcifications, in keeping with coronary artery disease. There are also mild-to-moderate  peripheral atherosclerotic vascular calcifications of thoracic aorta and its major branches. Mediastinum/Nodes: Visualized thyroid  gland appears grossly unremarkable. No solid / cystic mediastinal masses. The esophagus is nondistended precluding optimal assessment. No axillary, mediastinal or hilar lymphadenopathy by size criteria. Lungs/Pleura: The central tracheo-bronchial tree is patent. There are patchy areas of linear, plate-like atelectasis and/or scarring throughout bilateral lungs. Focal subpleural reticulations/scarring noted in the left lung upper lobe, anteriorly, likely from prior radiation. No mass or consolidation. No pleural effusion or pneumothorax. There is a subpleural 4-5 mm solid noncalcified nodule in the right lung lower lobe (series 4, image 116), which is unchanged since the prior study from 07/10/2022. there are multiple additional smaller solid, calcified and noncalcified nodules throughout bilateral lungs (marked with electronic arrow sign on series 2006). No new suspicious lung nodule seen. Musculoskeletal: Surgical staples and scarring noted in the left breast from prior lumpectomy. The visualized soft tissues of the chest wall are grossly unremarkable. No suspicious osseous lesions. There are mild multilevel degenerative changes in the visualized spine. CT ABDOMEN PELVIS FINDINGS Hepatobiliary: The liver is normal in size. Non-cirrhotic configuration. No suspicious mass. No intrahepatic or extrahepatic bile duct dilation. Very small volume layering sludge/calcified gallstones noted without imaging signs of acute cholecystitis. Normal gallbladder wall thickness. No pericholecystic inflammatory changes. Pancreas: Unremarkable. No pancreatic ductal dilatation or surrounding inflammatory changes. Spleen: Within normal limits. No focal lesion. Adrenals/Urinary Tract: Stable left adrenal nodule measuring approximately 8 x 11 mm. Unremarkable right adrenal gland. No suspicious renal mass  within the limitations of this unenhanced exam. Redemonstration of at least mildly atrophic left kidney and moderately atrophic right kidney containing multiple cortical/sinus cysts ranging in size from few mm up to 2 cm. There are several hyperattenuating nodules in the left kidney, similar to the prior study, even though incompletely characterized, favored to represent proteinaceous/hemorrhagic cysts. No nephroureterolithiasis or obstructive uropathy. Urinary bladder is under distended, precluding optimal assessment. However, no large mass or stones identified. No perivesical fat stranding. Stomach/Bowel: There is tiny sliding hiatal hernia. Status post right  partial hemicolectomy with ileocolonic side-to-side anastomosis in the right midabdomen. There is also colocolonic anastomosis in the rectosigmoid junction region. No disproportionate dilation of the small or large bowel loops. No evidence of abnormal bowel wall thickening or inflammatory changes. There are multiple diverticula mainly in the sigmoid colon, without imaging signs of diverticulitis. Vascular/Lymphatic: No ascites or pneumoperitoneum. There is stable portacaval lymph node measuring 1.3 x 1.6 cm. There is also stable mesenteric soft tissue nodule measuring 1.7 x 1.9 cm, grossly similar to prior study when remeasured in similar fashion. No aneurysmal dilation of the major abdominal arteries. There are moderate peripheral atherosclerotic vascular calcifications of the aorta and its major branches. Reproductive: The uterus is grossly unremarkable. No large adnexal mass. Other: Midline surgical scar noted. Redemonstration of left paramedian periumbilical ventral hernia containing portion of unobstructed transverse colon. There is an additional left-sided spigelian hernia containing fat (series 2, image 81). The soft tissues and abdominal wall are otherwise unremarkable. Musculoskeletal: No suspicious osseous lesions. There are mild multilevel  degenerative changes in the visualized spine. IMPRESSION: 1. Resolution of previously seen left pleural effusion and associated atelectasis. 2. Otherwise, essentially stable exam. 3. No new metastatic disease identified within the chest, abdomen or pelvis. 4. Stable portacaval lymph node and solid mesenteric nodule. 5. Multiple other nonacute observations, as described above. Aortic Atherosclerosis (ICD10-I70.0). Electronically Signed   By: Ree Molt M.D.   On: 11/26/2023 08:01    Microbiology: Results for orders placed or performed during the hospital encounter of 04/16/23  Resp panel by RT-PCR (RSV, Flu A&B, Covid) Anterior Nasal Swab     Status: Abnormal   Collection Time: 04/16/23 12:30 PM   Specimen: Anterior Nasal Swab  Result Value Ref Range Status   SARS Coronavirus 2 by RT PCR NEGATIVE NEGATIVE Final    Comment: (NOTE) SARS-CoV-2 target nucleic acids are NOT DETECTED.  The SARS-CoV-2 RNA is generally detectable in upper respiratory specimens during the acute phase of infection. The lowest concentration of SARS-CoV-2 viral copies this assay can detect is 138 copies/mL. A negative result does not preclude SARS-Cov-2 infection and should not be used as the sole basis for treatment or other patient management decisions. A negative result may occur with  improper specimen collection/handling, submission of specimen other than nasopharyngeal swab, presence of viral mutation(s) within the areas targeted by this assay, and inadequate number of viral copies(<138 copies/mL). A negative result must be combined with clinical observations, patient history, and epidemiological information. The expected result is Negative.  Fact Sheet for Patients:  BloggerCourse.com  Fact Sheet for Healthcare Providers:  SeriousBroker.it  This test is no t yet approved or cleared by the [place]  FDA and  has been authorized for detection and/or  diagnosis of SARS-CoV-2 by FDA under an Emergency Use Authorization (EUA). This EUA will remain  in effect (meaning this test can be used) for the duration of the COVID-19 declaration under Section 564(b)(1) of the Act, 21 U.S.C.section 360bbb-3(b)(1), unless the authorization is terminated  or revoked sooner.       Influenza A by PCR POSITIVE (A) NEGATIVE Final   Influenza B by PCR NEGATIVE NEGATIVE Final    Comment: (NOTE) The Xpert Xpress SARS-CoV-2/FLU/RSV plus assay is intended as an aid in the diagnosis of influenza from Nasopharyngeal swab specimens and should not be used as a sole basis for treatment. Nasal washings and aspirates are unacceptable for Xpert Xpress SARS-CoV-2/FLU/RSV testing.  Fact Sheet for Patients: BloggerCourse.com  Fact Sheet for Healthcare Providers: SeriousBroker.it  This test is not yet approved or cleared by the [place]  FDA and has been authorized for detection and/or diagnosis of SARS-CoV-2 by FDA under an Emergency Use Authorization (EUA). This EUA will remain in effect (meaning this test can be used) for the duration of the COVID-19 declaration under Section 564(b)(1) of the Act, 21 U.S.C. section 360bbb-3(b)(1), unless the authorization is terminated or revoked.     Resp Syncytial Virus by PCR NEGATIVE NEGATIVE Final    Comment: (NOTE) Fact Sheet for Patients: BloggerCourse.com  Fact Sheet for Healthcare Providers: SeriousBroker.it  This test is not yet approved or cleared by the [place]  FDA and has been authorized for detection and/or diagnosis of SARS-CoV-2 by FDA under an Emergency Use Authorization (EUA). This EUA will remain in effect (meaning this test can be used) for the duration of the COVID-19 declaration under Section 564(b)(1) of the Act, 21 U.S.C. section 360bbb-3(b)(1), unless the authorization is terminated  or revoked.  Performed at [facility], [address], [place], [place] [address]   MRSA Next Gen by PCR, Nasal     Status: Abnormal   Collection Time: 04/16/23 10:43 PM   Specimen: Nasal Mucosa; Nasal Swab  Result Value Ref Range Status   MRSA by PCR Next Gen DETECTED (A) NOT DETECTED Final    Comment: RESULT CALLED TO, READ BACK BY AND VERIFIED WITH: TONYA,RN@[id number]  04/17/23 MK (NOTE) The GeneXpert MRSA Assay (FDA approved for NASAL specimens only), is one component of a comprehensive MRSA colonization surveillance program. It is not intended to diagnose MRSA infection nor to guide or monitor treatment for MRSA infections. Test performance is not FDA approved in patients less than 7 years old. Performed at [facility], [address], [place], [place] [address]     Labs: CBC: Recent Labs  Lab 12/06/23 1824 12/07/23 0311  WBC 14.1* 12.0*  NEUTROABS 12.0*  --   HGB 10.0* 9.1*  HCT 30.0* 26.9*  MCV 93.8 94.7  PLT 266 239   Basic Metabolic Panel: Recent Labs  Lab 12/06/23 1824 12/07/23 0311  NA 139 139  K 3.2* 3.5  CL 96* 99  CO2 28 26  GLUCOSE 109* 140*  BUN 12 18  CREATININE 2.92* 3.71*  CALCIUM  9.4 8.9  MG  --  1.8   Liver Function Tests: Recent Labs  Lab 12/06/23 1824  AST 20  ALT 14  ALKPHOS 83  BILITOT 0.7  PROT 6.4*  ALBUMIN  2.8*   CBG: Recent Labs  Lab 12/06/23 1811  GLUCAP 97    Discharge time spent: 43 minutes.  Signed: Deliliah Room, MD [facility] 12/08/2023

## 2023-12-08 NOTE — Plan of Care (Signed)
  Problem: Health Behavior/Discharge Planning: Goal: Ability to manage health-related needs will improve Outcome: Progressing   Problem: Self-Care: Goal: Ability to participate in self-care as condition permits will improve Outcome: Progressing   Problem: Safety: Goal: Ability to remain free from injury will improve Outcome: Progressing   Problem: Education: Goal: Knowledge of disease or condition will improve Outcome: Completed/Met   Problem: Ischemic Stroke/TIA Tissue Perfusion: Goal: Complications of ischemic stroke/TIA will be minimized Outcome: Completed/Met   Problem: Coping: Goal: Will verbalize positive feelings about self Outcome: Completed/Met   Problem: Nutrition: Goal: Risk of aspiration will decrease Outcome: Completed/Met Goal: Dietary intake will improve Outcome: Completed/Met

## 2023-12-09 DIAGNOSIS — I120 Hypertensive chronic kidney disease with stage 5 chronic kidney disease or end stage renal disease: Secondary | ICD-10-CM | POA: Diagnosis not present

## 2023-12-09 DIAGNOSIS — Z992 Dependence on renal dialysis: Secondary | ICD-10-CM | POA: Diagnosis not present

## 2023-12-09 DIAGNOSIS — I10 Essential (primary) hypertension: Secondary | ICD-10-CM | POA: Diagnosis not present

## 2023-12-09 DIAGNOSIS — I2510 Atherosclerotic heart disease of native coronary artery without angina pectoris: Secondary | ICD-10-CM | POA: Diagnosis not present

## 2023-12-09 DIAGNOSIS — N186 End stage renal disease: Secondary | ICD-10-CM | POA: Diagnosis not present

## 2023-12-10 ENCOUNTER — Telehealth (HOSPITAL_COMMUNITY): Payer: Self-pay | Admitting: Nephrology

## 2023-12-10 NOTE — Telephone Encounter (Signed)
 Transition of Care - Initial Contact after Hospitalization  Date of discharge: 12/08/2023  Date of contact: 12/10/23  Method: Phone Spoke to: Patient  Patient contacted to discuss transition of care from recent inpatient hospitalization. Patient was admitted to [facility] from 8/28-8/30/2025 with SAH and non-ischemia CVA.  The discharge medication list was reviewed. Patient understands the changes and has no concerns.   Patient will return to his/her outpatient HD unit on: Tues 9/2  No other concerns at this time - not taking Eliquis .  Izetta Boehringer, PA-C [facility] Pager [phone]

## 2023-12-11 DIAGNOSIS — D631 Anemia in chronic kidney disease: Secondary | ICD-10-CM | POA: Diagnosis not present

## 2023-12-11 DIAGNOSIS — Z992 Dependence on renal dialysis: Secondary | ICD-10-CM | POA: Diagnosis not present

## 2023-12-11 DIAGNOSIS — I609 Nontraumatic subarachnoid hemorrhage, unspecified: Secondary | ICD-10-CM | POA: Diagnosis not present

## 2023-12-11 DIAGNOSIS — N2581 Secondary hyperparathyroidism of renal origin: Secondary | ICD-10-CM | POA: Diagnosis not present

## 2023-12-11 DIAGNOSIS — N186 End stage renal disease: Secondary | ICD-10-CM | POA: Diagnosis not present

## 2023-12-11 DIAGNOSIS — K429 Umbilical hernia without obstruction or gangrene: Secondary | ICD-10-CM | POA: Diagnosis not present

## 2023-12-11 DIAGNOSIS — R52 Pain, unspecified: Secondary | ICD-10-CM | POA: Diagnosis not present

## 2023-12-11 DIAGNOSIS — K439 Ventral hernia without obstruction or gangrene: Secondary | ICD-10-CM | POA: Diagnosis not present

## 2023-12-13 ENCOUNTER — Encounter (HOSPITAL_COMMUNITY)
Admission: EM | Disposition: A | Discharge status: Home Health | Payer: Self-pay | Source: Ambulatory Visit | Attending: Internal Medicine

## 2023-12-13 ENCOUNTER — Emergency Department (HOSPITAL_BASED_OUTPATIENT_CLINIC_OR_DEPARTMENT_OTHER)

## 2023-12-13 ENCOUNTER — Encounter (HOSPITAL_COMMUNITY): Payer: Self-pay

## 2023-12-13 ENCOUNTER — Inpatient Hospital Stay (HOSPITAL_BASED_OUTPATIENT_CLINIC_OR_DEPARTMENT_OTHER)
Admission: EM | Admit: 2023-12-13 | Discharge: 2023-12-28 | DRG: 329 | Disposition: A | Discharge status: Home Health | Source: Ambulatory Visit | Attending: Internal Medicine | Admitting: Internal Medicine

## 2023-12-13 ENCOUNTER — Other Ambulatory Visit: Payer: Self-pay

## 2023-12-13 ENCOUNTER — Inpatient Hospital Stay (HOSPITAL_COMMUNITY): Admitting: Certified Registered"

## 2023-12-13 DIAGNOSIS — C50919 Malignant neoplasm of unspecified site of unspecified female breast: Secondary | ICD-10-CM | POA: Diagnosis present

## 2023-12-13 DIAGNOSIS — M109 Gout, unspecified: Secondary | ICD-10-CM | POA: Diagnosis not present

## 2023-12-13 DIAGNOSIS — C50412 Malignant neoplasm of upper-outer quadrant of left female breast: Secondary | ICD-10-CM | POA: Diagnosis not present

## 2023-12-13 DIAGNOSIS — C50912 Malignant neoplasm of unspecified site of left female breast: Secondary | ICD-10-CM | POA: Diagnosis present

## 2023-12-13 DIAGNOSIS — Z79811 Long term (current) use of aromatase inhibitors: Secondary | ICD-10-CM | POA: Diagnosis not present

## 2023-12-13 DIAGNOSIS — Z888 Allergy status to other drugs, medicaments and biological substances status: Secondary | ICD-10-CM

## 2023-12-13 DIAGNOSIS — J929 Pleural plaque without asbestos: Secondary | ICD-10-CM | POA: Diagnosis not present

## 2023-12-13 DIAGNOSIS — I96 Gangrene, not elsewhere classified: Secondary | ICD-10-CM | POA: Diagnosis present

## 2023-12-13 DIAGNOSIS — E669 Obesity, unspecified: Secondary | ICD-10-CM | POA: Diagnosis present

## 2023-12-13 DIAGNOSIS — I480 Paroxysmal atrial fibrillation: Secondary | ICD-10-CM | POA: Diagnosis not present

## 2023-12-13 DIAGNOSIS — I2510 Atherosclerotic heart disease of native coronary artery without angina pectoris: Secondary | ICD-10-CM | POA: Diagnosis not present

## 2023-12-13 DIAGNOSIS — G2581 Restless legs syndrome: Secondary | ICD-10-CM | POA: Diagnosis present

## 2023-12-13 DIAGNOSIS — N186 End stage renal disease: Secondary | ICD-10-CM | POA: Diagnosis not present

## 2023-12-13 DIAGNOSIS — I517 Cardiomegaly: Secondary | ICD-10-CM | POA: Diagnosis not present

## 2023-12-13 DIAGNOSIS — R198 Other specified symptoms and signs involving the digestive system and abdomen: Secondary | ICD-10-CM | POA: Diagnosis present

## 2023-12-13 DIAGNOSIS — J45909 Unspecified asthma, uncomplicated: Secondary | ICD-10-CM | POA: Diagnosis present

## 2023-12-13 DIAGNOSIS — Z7901 Long term (current) use of anticoagulants: Secondary | ICD-10-CM | POA: Diagnosis not present

## 2023-12-13 DIAGNOSIS — Z170 Estrogen receptor positive status [ER+]: Secondary | ICD-10-CM | POA: Diagnosis not present

## 2023-12-13 DIAGNOSIS — E739 Lactose intolerance, unspecified: Secondary | ICD-10-CM | POA: Diagnosis not present

## 2023-12-13 DIAGNOSIS — I609 Nontraumatic subarachnoid hemorrhage, unspecified: Secondary | ICD-10-CM | POA: Diagnosis not present

## 2023-12-13 DIAGNOSIS — K429 Umbilical hernia without obstruction or gangrene: Secondary | ICD-10-CM | POA: Diagnosis not present

## 2023-12-13 DIAGNOSIS — F5104 Psychophysiologic insomnia: Secondary | ICD-10-CM | POA: Diagnosis present

## 2023-12-13 DIAGNOSIS — I700 Atherosclerosis of aorta: Secondary | ICD-10-CM | POA: Diagnosis not present

## 2023-12-13 DIAGNOSIS — I132 Hypertensive heart and chronic kidney disease with heart failure and with stage 5 chronic kidney disease, or end stage renal disease: Secondary | ICD-10-CM | POA: Diagnosis not present

## 2023-12-13 DIAGNOSIS — Z992 Dependence on renal dialysis: Secondary | ICD-10-CM

## 2023-12-13 DIAGNOSIS — K5190 Ulcerative colitis, unspecified, without complications: Secondary | ICD-10-CM | POA: Diagnosis not present

## 2023-12-13 DIAGNOSIS — K450 Other specified abdominal hernia with obstruction, without gangrene: Secondary | ICD-10-CM | POA: Diagnosis not present

## 2023-12-13 DIAGNOSIS — K8020 Calculus of gallbladder without cholecystitis without obstruction: Secondary | ICD-10-CM | POA: Diagnosis not present

## 2023-12-13 DIAGNOSIS — I10 Essential (primary) hypertension: Secondary | ICD-10-CM | POA: Diagnosis not present

## 2023-12-13 DIAGNOSIS — D631 Anemia in chronic kidney disease: Secondary | ICD-10-CM | POA: Diagnosis not present

## 2023-12-13 DIAGNOSIS — K631 Perforation of intestine (nontraumatic): Secondary | ICD-10-CM | POA: Diagnosis not present

## 2023-12-13 DIAGNOSIS — Z6830 Body mass index (BMI) 30.0-30.9, adult: Secondary | ICD-10-CM

## 2023-12-13 DIAGNOSIS — Z86001 Personal history of in-situ neoplasm of cervix uteri: Secondary | ICD-10-CM

## 2023-12-13 DIAGNOSIS — I5032 Chronic diastolic (congestive) heart failure: Secondary | ICD-10-CM | POA: Diagnosis present

## 2023-12-13 DIAGNOSIS — Z79899 Other long term (current) drug therapy: Secondary | ICD-10-CM

## 2023-12-13 DIAGNOSIS — Z87891 Personal history of nicotine dependence: Secondary | ICD-10-CM

## 2023-12-13 DIAGNOSIS — K219 Gastro-esophageal reflux disease without esophagitis: Secondary | ICD-10-CM | POA: Diagnosis not present

## 2023-12-13 DIAGNOSIS — I120 Hypertensive chronic kidney disease with stage 5 chronic kidney disease or end stage renal disease: Secondary | ICD-10-CM

## 2023-12-13 DIAGNOSIS — K55049 Acute infarction of large intestine, extent unspecified: Secondary | ICD-10-CM | POA: Diagnosis not present

## 2023-12-13 DIAGNOSIS — E785 Hyperlipidemia, unspecified: Secondary | ICD-10-CM | POA: Diagnosis present

## 2023-12-13 DIAGNOSIS — Z800 Family history of malignant neoplasm of digestive organs: Secondary | ICD-10-CM

## 2023-12-13 DIAGNOSIS — K567 Ileus, unspecified: Secondary | ICD-10-CM | POA: Diagnosis not present

## 2023-12-13 DIAGNOSIS — Z91048 Other nonmedicinal substance allergy status: Secondary | ICD-10-CM

## 2023-12-13 DIAGNOSIS — K430 Incisional hernia with obstruction, without gangrene: Secondary | ICD-10-CM | POA: Diagnosis not present

## 2023-12-13 HISTORY — PX: COLOSTOMY: SHX63

## 2023-12-13 HISTORY — PX: LAPAROTOMY: SHX154

## 2023-12-13 LAB — CBC WITH DIFFERENTIAL/PLATELET
Abs Immature Granulocytes: 0.29 K/uL — ABNORMAL HIGH (ref 0.00–0.07)
Basophils Absolute: 0 K/uL (ref 0.0–0.1)
Basophils Relative: 0 %
Eosinophils Absolute: 0.1 K/uL (ref 0.0–0.5)
Eosinophils Relative: 0 %
HCT: 26.2 % — ABNORMAL LOW (ref 36.0–46.0)
Hemoglobin: 8.8 g/dL — ABNORMAL LOW (ref 12.0–15.0)
Immature Granulocytes: 2 %
Lymphocytes Relative: 5 %
Lymphs Abs: 0.8 K/uL (ref 0.7–4.0)
MCH: 31.5 pg (ref 26.0–34.0)
MCHC: 33.6 g/dL (ref 30.0–36.0)
MCV: 93.9 fL (ref 80.0–100.0)
Monocytes Absolute: 1.3 K/uL — ABNORMAL HIGH (ref 0.1–1.0)
Monocytes Relative: 7 %
Neutro Abs: 14.8 K/uL — ABNORMAL HIGH (ref 1.7–7.7)
Neutrophils Relative %: 86 %
Platelets: 330 K/uL (ref 150–400)
RBC: 2.79 MIL/uL — ABNORMAL LOW (ref 3.87–5.11)
RDW: 15.7 % — ABNORMAL HIGH (ref 11.5–15.5)
WBC: 17.2 K/uL — ABNORMAL HIGH (ref 4.0–10.5)
nRBC: 0 % (ref 0.0–0.2)

## 2023-12-13 LAB — LACTIC ACID, PLASMA: Lactic Acid, Venous: 0.9 mmol/L (ref 0.5–1.9)

## 2023-12-13 LAB — COMPREHENSIVE METABOLIC PANEL WITH GFR
ALT: 11 U/L (ref 0–44)
AST: 13 U/L — ABNORMAL LOW (ref 15–41)
Albumin: 3.2 g/dL — ABNORMAL LOW (ref 3.5–5.0)
Alkaline Phosphatase: 132 U/L — ABNORMAL HIGH (ref 38–126)
Anion gap: 17 — ABNORMAL HIGH (ref 5–15)
BUN: 57 mg/dL — ABNORMAL HIGH (ref 8–23)
CO2: 24 mmol/L (ref 22–32)
Calcium: 9.6 mg/dL (ref 8.9–10.3)
Chloride: 96 mmol/L — ABNORMAL LOW (ref 98–111)
Creatinine, Ser: 7.15 mg/dL — ABNORMAL HIGH (ref 0.44–1.00)
GFR, Estimated: 5 mL/min — ABNORMAL LOW (ref >60)
Glucose, Bld: 105 mg/dL — ABNORMAL HIGH (ref 70–99)
Potassium: 3.4 mmol/L — ABNORMAL LOW (ref 3.5–5.1)
Sodium: 137 mmol/L (ref 135–145)
Total Bilirubin: 0.3 mg/dL (ref 0.0–1.2)
Total Protein: 5.9 g/dL — ABNORMAL LOW (ref 6.5–8.1)

## 2023-12-13 LAB — TYPE AND SCREEN
ABO/RH(D): A POS
Antibody Screen: NEGATIVE

## 2023-12-13 LAB — SURGICAL PCR SCREEN
MRSA, PCR: NEGATIVE
Staphylococcus aureus: NEGATIVE

## 2023-12-13 SURGERY — LAPAROTOMY, EXPLORATORY
Anesthesia: General | Site: Abdomen

## 2023-12-13 MED ORDER — PHENYLEPHRINE 80 MCG/ML (10ML) SYRINGE FOR IV PUSH (FOR BLOOD PRESSURE SUPPORT)
PREFILLED_SYRINGE | INTRAVENOUS | Status: AC
Start: 2023-12-13 — End: 2023-12-13
  Filled 2023-12-13: qty 10

## 2023-12-13 MED ORDER — DEXAMETHASONE SODIUM PHOSPHATE 10 MG/ML IJ SOLN
INTRAMUSCULAR | Status: DC | PRN
  Administered 2023-12-13: 5 mg via INTRAVENOUS

## 2023-12-13 MED ORDER — PIPERACILLIN-TAZOBACTAM IN DEX 2-0.25 GM/50ML IV SOLN
2.2500 g | Freq: Three times a day (TID) | INTRAVENOUS | Status: AC
  Administered 2023-12-13 – 2023-12-18 (×16): 2.25 g via INTRAVENOUS
  Filled 2023-12-13 (×19): qty 50

## 2023-12-13 MED ORDER — SODIUM CHLORIDE 0.9 % IV SOLN: INTRAVENOUS | Status: DC

## 2023-12-13 MED ORDER — SODIUM CHLORIDE 0.9 % IV BOLUS
1000.0000 mL | Freq: Once | INTRAVENOUS | Status: AC
  Administered 2023-12-13: 1000 mL via INTRAVENOUS

## 2023-12-13 MED ORDER — 0.9 % SODIUM CHLORIDE (POUR BTL) OPTIME
TOPICAL | Status: DC | PRN
  Administered 2023-12-13: 1000 mL

## 2023-12-13 MED ORDER — ALBUMIN HUMAN 5 % IV SOLN: INTRAVENOUS | Status: DC | PRN

## 2023-12-13 MED ORDER — FENTANYL CITRATE (PF) 100 MCG/2ML IJ SOLN: 25.0000 ug | INTRAMUSCULAR | Status: DC | PRN

## 2023-12-13 MED ORDER — ONDANSETRON HCL 4 MG/2ML IJ SOLN
INTRAMUSCULAR | Status: DC | PRN
  Administered 2023-12-13: 4 mg via INTRAVENOUS

## 2023-12-13 MED ORDER — PHENYLEPHRINE HCL-NACL 20-0.9 MG/250ML-% IV SOLN
INTRAVENOUS | Status: DC | PRN
  Administered 2023-12-13: 20 ug/min via INTRAVENOUS

## 2023-12-13 MED ORDER — CEFAZOLIN SODIUM 1 G IJ SOLR
INTRAMUSCULAR | Status: AC
  Filled 2023-12-13: qty 40

## 2023-12-13 MED ORDER — PIPERACILLIN-TAZOBACTAM 3.375 G IVPB 30 MIN
3.3750 g | Freq: Once | INTRAVENOUS | Status: AC
  Administered 2023-12-13: 3.375 g via INTRAVENOUS
  Filled 2023-12-13: qty 50

## 2023-12-13 MED ORDER — SODIUM CHLORIDE 0.9 % IV SOLN: INTRAVENOUS | Status: AC | PRN

## 2023-12-13 MED ORDER — ONDANSETRON HCL 4 MG/2ML IJ SOLN
INTRAMUSCULAR | Status: AC
Start: 2023-12-13 — End: 2023-12-13
  Filled 2023-12-13: qty 6

## 2023-12-13 MED ORDER — FENTANYL CITRATE (PF) 250 MCG/5ML IJ SOLN
INTRAMUSCULAR | Status: DC | PRN
  Administered 2023-12-13: 100 ug via INTRAVENOUS
  Administered 2023-12-13 (×3): 50 ug via INTRAVENOUS

## 2023-12-13 MED ORDER — METOPROLOL TARTRATE 5 MG/5ML IV SOLN
INTRAVENOUS | Status: AC
  Filled 2023-12-13: qty 5

## 2023-12-13 MED ORDER — DEXAMETHASONE SODIUM PHOSPHATE 10 MG/ML IJ SOLN
INTRAMUSCULAR | Status: AC
  Filled 2023-12-13: qty 2

## 2023-12-13 MED ORDER — OXYCODONE HCL 5 MG PO TABS
5.0000 mg | ORAL_TABLET | ORAL | Status: DC | PRN
  Administered 2023-12-13 – 2023-12-15 (×5): 10 mg via ORAL
  Filled 2023-12-13 (×6): qty 2

## 2023-12-13 MED ORDER — FENTANYL CITRATE (PF) 250 MCG/5ML IJ SOLN
INTRAMUSCULAR | Status: AC
  Filled 2023-12-13: qty 5

## 2023-12-13 MED ORDER — PHENYLEPHRINE 80 MCG/ML (10ML) SYRINGE FOR IV PUSH (FOR BLOOD PRESSURE SUPPORT)
PREFILLED_SYRINGE | INTRAVENOUS | Status: AC
Start: 2023-12-13 — End: 2023-12-13
  Filled 2023-12-13: qty 20

## 2023-12-13 MED ORDER — LIDOCAINE 2% (20 MG/ML) 5 ML SYRINGE
INTRAMUSCULAR | Status: AC
  Filled 2023-12-13: qty 15

## 2023-12-13 MED ORDER — LIDOCAINE 2% (20 MG/ML) 5 ML SYRINGE
INTRAMUSCULAR | Status: DC | PRN
  Administered 2023-12-13: 60 mg via INTRAVENOUS

## 2023-12-13 MED ORDER — PROPOFOL 10 MG/ML IV BOLUS
INTRAVENOUS | Status: DC | PRN
  Administered 2023-12-13: 100 mg via INTRAVENOUS

## 2023-12-13 MED ORDER — SUGAMMADEX SODIUM 200 MG/2ML IV SOLN
INTRAVENOUS | Status: DC | PRN
  Administered 2023-12-13: 140 mg via INTRAVENOUS

## 2023-12-13 MED ORDER — CHLORHEXIDINE GLUCONATE 0.12 % MT SOLN: 15.0000 mL | Freq: Once | OROMUCOSAL | Status: AC

## 2023-12-13 MED ORDER — METOPROLOL TARTRATE 5 MG/5ML IV SOLN
INTRAVENOUS | Status: DC | PRN
  Administered 2023-12-13 (×3): 1 mg via INTRAVENOUS

## 2023-12-13 MED ORDER — MORPHINE SULFATE (PF) 2 MG/ML IV SOLN: 2.0000 mg | INTRAVENOUS | Status: DC | PRN

## 2023-12-13 MED ORDER — OXYCODONE HCL 5 MG/5ML PO SOLN: 5.0000 mg | Freq: Once | ORAL | Status: DC | PRN

## 2023-12-13 MED ORDER — CHLORHEXIDINE GLUCONATE CLOTH 2 % EX PADS
6.0000 | MEDICATED_PAD | Freq: Once | CUTANEOUS | Status: AC
  Administered 2023-12-13: 6 via TOPICAL

## 2023-12-13 MED ORDER — CHLORHEXIDINE GLUCONATE 0.12 % MT SOLN
OROMUCOSAL | Status: AC
  Administered 2023-12-13: 15 mL via OROMUCOSAL
  Filled 2023-12-13: qty 15

## 2023-12-13 MED ORDER — LIDOCAINE 2% (20 MG/ML) 5 ML SYRINGE
INTRAMUSCULAR | Status: AC
Start: 2023-12-13 — End: 2023-12-13
  Filled 2023-12-13: qty 5

## 2023-12-13 MED ORDER — ACETAMINOPHEN 10 MG/ML IV SOLN
INTRAVENOUS | Status: AC
  Filled 2023-12-13: qty 100

## 2023-12-13 MED ORDER — SUCCINYLCHOLINE CHLORIDE 200 MG/10ML IV SOSY
PREFILLED_SYRINGE | INTRAVENOUS | Status: AC
  Filled 2023-12-13: qty 10

## 2023-12-13 MED ORDER — ACETAMINOPHEN 10 MG/ML IV SOLN
INTRAVENOUS | Status: DC | PRN
  Administered 2023-12-13: 1000 mg via INTRAVENOUS

## 2023-12-13 MED ORDER — ORAL CARE MOUTH RINSE: 15.0000 mL | Freq: Once | OROMUCOSAL | Status: AC

## 2023-12-13 MED ORDER — PHENYLEPHRINE 80 MCG/ML (10ML) SYRINGE FOR IV PUSH (FOR BLOOD PRESSURE SUPPORT)
PREFILLED_SYRINGE | INTRAVENOUS | Status: DC | PRN
  Administered 2023-12-13: 240 ug via INTRAVENOUS

## 2023-12-13 MED ORDER — ROCURONIUM BROMIDE 10 MG/ML (PF) SYRINGE
PREFILLED_SYRINGE | INTRAVENOUS | Status: DC | PRN
  Administered 2023-12-13 (×2): 20 mg via INTRAVENOUS
  Administered 2023-12-13: 50 mg via INTRAVENOUS

## 2023-12-13 MED ORDER — HYDROMORPHONE HCL 1 MG/ML IJ SOLN
0.2500 mg | INTRAMUSCULAR | Status: DC | PRN
  Administered 2023-12-13 – 2023-12-14 (×2): 0.25 mg via INTRAVENOUS
  Filled 2023-12-13 (×2): qty 0.5

## 2023-12-13 MED ORDER — PIPERACILLIN-TAZOBACTAM 3.375 G IVPB
INTRAVENOUS | Status: AC
  Filled 2023-12-13: qty 50

## 2023-12-13 MED ORDER — ONDANSETRON HCL 4 MG/2ML IJ SOLN
INTRAMUSCULAR | Status: AC
  Filled 2023-12-13: qty 2

## 2023-12-13 MED ORDER — ROCURONIUM BROMIDE 10 MG/ML (PF) SYRINGE
PREFILLED_SYRINGE | INTRAVENOUS | Status: AC
  Filled 2023-12-13: qty 10

## 2023-12-13 MED ORDER — KETOROLAC TROMETHAMINE 30 MG/ML IJ SOLN
INTRAMUSCULAR | Status: AC
  Filled 2023-12-13: qty 1

## 2023-12-13 MED ORDER — DEXAMETHASONE SODIUM PHOSPHATE 10 MG/ML IJ SOLN
INTRAMUSCULAR | Status: AC
Start: 2023-12-13 — End: 2023-12-13
  Filled 2023-12-13: qty 1

## 2023-12-13 MED ORDER — DEXMEDETOMIDINE HCL IN NACL 80 MCG/20ML IV SOLN
INTRAVENOUS | Status: AC
Start: 2023-12-13 — End: 2023-12-13
  Filled 2023-12-13: qty 20

## 2023-12-13 MED ORDER — OXYCODONE HCL 5 MG PO TABS: 5.0000 mg | ORAL_TABLET | Freq: Once | ORAL | Status: DC | PRN

## 2023-12-13 MED ORDER — ONDANSETRON HCL 4 MG/2ML IJ SOLN: 4.0000 mg | Freq: Four times a day (QID) | INTRAMUSCULAR | Status: DC | PRN

## 2023-12-13 SURGICAL SUPPLY — 42 items
BAG COUNTER SPONGE SURGICOUNT (BAG) ×2 IMPLANT
BINDER ABDOMINAL 12 ML 46-62 (SOFTGOODS) IMPLANT
BNDG GAUZE DERMACEA FLUFF 4 (GAUZE/BANDAGES/DRESSINGS) IMPLANT
CHLORAPREP W/TINT 26 (MISCELLANEOUS) ×2 IMPLANT
COVER SURGICAL LIGHT HANDLE (MISCELLANEOUS) ×2 IMPLANT
DERMABOND ADVANCED .7 DNX12 (GAUZE/BANDAGES/DRESSINGS) ×4 IMPLANT
DRAPE LAPAROSCOPIC ABDOMINAL (DRAPES) ×2 IMPLANT
DRAPE WARM FLUID 44X44 (DRAPES) ×2 IMPLANT
DRSG OPSITE POSTOP 4X10 (GAUZE/BANDAGES/DRESSINGS) IMPLANT
DRSG OPSITE POSTOP 4X8 (GAUZE/BANDAGES/DRESSINGS) IMPLANT
ELECT BLADE 6.5 EXT (BLADE) IMPLANT
ELECTRODE REM PT RTRN 9FT ADLT (ELECTROSURGICAL) ×2 IMPLANT
GAUZE PAD ABD 8X10 STRL (GAUZE/BANDAGES/DRESSINGS) IMPLANT
GAUZE SPONGE 4X4 12PLY STRL (GAUZE/BANDAGES/DRESSINGS) IMPLANT
GLOVE BIOGEL PI IND STRL 6 (GLOVE) ×2 IMPLANT
GLOVE BIOGEL PI MICRO STRL 5.5 (GLOVE) ×2 IMPLANT
GOWN STRL REUS W/ TWL LRG LVL3 (GOWN DISPOSABLE) ×4 IMPLANT
HANDLE SUCTION POOLE (INSTRUMENTS) ×2 IMPLANT
KIT BASIN OR (CUSTOM PROCEDURE TRAY) ×2 IMPLANT
KIT TURNOVER KIT B (KITS) ×2 IMPLANT
LIGASURE IMPACT 36 18CM CVD LR (INSTRUMENTS) IMPLANT
MESH VICRYL KNITTED 12X12 (Mesh General) IMPLANT
NS IRRIG 1000ML POUR BTL (IV SOLUTION) ×4 IMPLANT
PACK GENERAL/GYN (CUSTOM PROCEDURE TRAY) ×2 IMPLANT
PAD ARMBOARD POSITIONER FOAM (MISCELLANEOUS) ×2 IMPLANT
RELOAD STAPLE 75 3.8 BLU REG (ENDOMECHANICALS) IMPLANT
SPECIMEN JAR LARGE (MISCELLANEOUS) IMPLANT
SPONGE T-LAP 18X18 ~~LOC~~+RFID (SPONGE) IMPLANT
STAPLER PROXIMATE 75MM BLUE (STAPLE) IMPLANT
STAPLER SKIN PROX 35W (STAPLE) IMPLANT
SUT MNCRL AB 4-0 PS2 18 (SUTURE) ×4 IMPLANT
SUT NOVA NAB GS-21 0 18 T12 DT (SUTURE) IMPLANT
SUT PDS AB 1 TP1 96 (SUTURE) ×4 IMPLANT
SUT SILK 2 0 SH CR/8 (SUTURE) ×2 IMPLANT
SUT SILK 2 0 TIES 10X30 (SUTURE) ×2 IMPLANT
SUT SILK 3 0 SH CR/8 (SUTURE) ×2 IMPLANT
SUT SILK 3 0 TIES 10X30 (SUTURE) ×2 IMPLANT
SUT VIC AB 0 CT1 18XCR BRD8 (SUTURE) IMPLANT
SUT VIC AB 3-0 SH 18 (SUTURE) IMPLANT
SUT VIC AB 3-0 SH 27XBRD (SUTURE) ×4 IMPLANT
TOWEL GREEN STERILE (TOWEL DISPOSABLE) ×2 IMPLANT
TRAY FOLEY MTR SLVR 16FR STAT (SET/KITS/TRAYS/PACK) IMPLANT

## 2023-12-13 NOTE — Consult Note (Addendum)
 Consult Note  [name] [birth year]  [id number].    Requesting MD:  Dr. 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 Patient has had known hernias for many years, but was asymptomatic until recently. She noticed about a week ago that the area was more red, and went to her PCP and doxycycline  was initiated. Yesterday, she noticed that it was blackened and today it began draining fluid. She went for dialysis and was sent to the ED. She reports some nausea last night but denies vomiting. Denies diarrhea, bowel changes, fevers, chills, chest pain, or SOB. Did not undergo dialysis today due to this acute concern. Labs in the ED were significant for a leukocytosis of 17. She was started on Zosyn  and transferred to the [facility] ED. She remains hemodynamically stable.  Of note, patient recently hospitalized from 8/28-8/30 after having dialysis and enduring a syncopal event that led to a fall. She fell into a refrigerator hitting her head. 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   Her previous abdominal surgeries include surgery for perforated appendicitis in her 60s, an abdominal hysterectomy 25 years ago, a colostomy (she reports this was done due to sequelae from her hysterectomy), and subsequent colostomy reversal.  Patient does not consume alcohol or use illicit drugs. Patient is a former cigarette smoker (Quit around 14).  ROS: Per HPI  Family History   Problem Relation Age of Onset   Colon cancer Father    Breast cancer Neg Hx     Past Medical History:  Diagnosis Date   Abnormal Pap smear of vagina    Anemia    Asthma    Mild   Cancer (HCC)    left breast ILC   Chronic kidney disease (CKD)    stage 4   GERD (gastroesophageal reflux disease)    Gout 05/2012   Hernia, incisional    History of abnormal Pap smear    CIN III, 2001   History of blood transfusion    POST OP   History of uterine fibroid    Hypertension    Peritonitis (HCC)    Respiratory failure (HCC)    after ruptured appendix   Ruptured appendix    Seasonal allergies    Shingles 02/2013   very mild case   Urine incontinence     Past Surgical History:  Procedure Laterality Date   A/V FISTULAGRAM Right 03/19/2023   Procedure: A/V Fistulagram;  Surgeon: Sheree Penne Bruckner, MD;  Location: [facility] INVASIVE CV LAB;  Service: Cardiovascular;  Laterality: Right;   APPENDECTOMY     AV FISTULA PLACEMENT Right 04/24/2022   Procedure: RIGHT BRACHIOCEPHALIC ARTERIOVENOUS (AV) FISTULA CREATION;  Surgeon: Eliza Bruckner RAMAN, MD;  Location: [facility] OR;  Service: Vascular;  Laterality: Right;   BREAST BIOPSY Right    fibrocystic changes   BREAST BIOPSY Left 08/02/2021   BREAST LUMPECTOMY WITH RADIOACTIVE SEED LOCALIZATION Left 09/12/2021   Procedure: LEFT BREAST LUMPECTOMY WITH RADIOACTIVE SEED LOCALIZATION;  Surgeon: Ebbie Cough, MD;  Location: [facility];  Service: General;  Laterality: Left;   CATARACT EXTRACTION Bilateral    COLONOSCOPY     COLONOSCOPY WITH PROPOFOL  N/A 02/18/2018   Procedure: COLONOSCOPY WITH PROPOFOL ;  Surgeon: Rosalie Kitchens, MD;  Location: [facility] ENDOSCOPY;  Service: Endoscopy;  Laterality: N/A;  Use ultraslim scope    FISTULA SUPERFICIALIZATION Right 04/13/2023   Procedure: FISTULA SUPERFICIALIZATION;  Surgeon: Sheree Penne Bruckner, MD;  Location: [facility] OR;  Service: Vascular;  Laterality: Right;   IR FLUORO GUIDE CV LINE RIGHT   03/10/2023   LEEP  2000   CIN 2/3   RE-EXCISION OF BREAST LUMPECTOMY Left 09/29/2021   Procedure: LEFT  BREAST RE-EXCISION LUMPECTOMY;  Surgeon: Ebbie Cough, MD;  Location: [facility] ORS;  Service: General;  Laterality: Left;   REFRACTIVE SURGERY     RIGHT/LEFT HEART CATH AND CORONARY ANGIOGRAPHY N/A 04/23/2023   Procedure: RIGHT/LEFT HEART CATH AND CORONARY ANGIOGRAPHY;  Surgeon: Swaziland, Peter M, MD;  Location: [facility] INVASIVE CV LAB;  Service: Cardiovascular;  Laterality: N/A;   SUPRACERVICAL ABDOMINAL HYSTERECTOMY  2001    Social History:  reports that she quit smoking about 51 years ago. Her smoking use included cigarettes. She has never used smokeless tobacco. She reports that she does not drink alcohol and does not use drugs.  Allergies:  Allergies  Allergen Reactions   Ace Inhibitors     angioedema   Lactose Intolerance (Gi) Other (See Comments)    GI upset   Letrozole  Hives   Lisinopril Swelling   Mercury     Red eyes, through eye drops that contained thimerosal    Tape Dermatitis    plastic    (Not in a hospital admission)   Blood pressure (!) 143/72, pulse 96, temperature 98.3 F (36.8 C), temperature source Oral, resp. rate 18, SpO2 96%. Physical Exam:  General: resting comfortably, appears stated age, no apparent distress Neurological: alert and oriented, no focal deficits, cranial nerves grossly in tact HEENT: normocephalic, atraumatic, no scleral icterus CV: regular rate and rhythm, extremities warm and well-perfused Respiratory: normal work of breathing on room air Abdomen: soft, nondistended. Midline laparotomy scar. Periumbilical erythema and induration, with central skin necrosis and scant drainage.  Extremities: warm and well-perfused, no deformities, moving all extremities spontaneously Psychiatric: normal mood and affect Skin: warm and dry    Results for orders placed or performed during the hospital encounter of 12/13/23 (from the past 48 hours)   Comprehensive metabolic panel     Status: Abnormal   Collection Time: 12/13/23  1:58 PM  Result Value Ref Range   Sodium 137 135 - 145 mmol/L   Potassium 3.4 (L) 3.5 - 5.1 mmol/L   Chloride 96 (L) 98 - 111 mmol/L   CO2 24 22 - 32 mmol/L   Glucose, Bld 105 (H) 70 - 99 mg/dL    Comment: Glucose reference range applies only to samples taken after fasting for at least 8 hours.   BUN 57 (H) 8 - 23 mg/dL   Creatinine, Ser 2.84 (H) 0.44 - 1.00 mg/dL   Calcium  9.6 8.9 - 10.3 mg/dL   Total Protein 5.9 (L) 6.5 - 8.1 g/dL   Albumin  3.2 (L) 3.5 - 5.0 g/dL   AST 13 (L) 15 - 41 U/L   ALT 11 0 - 44 U/L   Alkaline Phosphatase 132 (H) 38 - 126 U/L   Total Bilirubin 0.3 0.0 - 1.2 mg/dL   GFR, Estimated 5 (L) >60 mL/min    Comment: (NOTE) Calculated using  the CKD-EPI Creatinine Equation (2021)    Anion gap 17 (H) 5 - 15    Comment: Performed at [facility], [address] [place], [place] [address]  CBC with Differential     Status: Abnormal   Collection Time: 12/13/23  1:58 PM  Result Value Ref Range   WBC 17.2 (H) 4.0 - 10.5 K/uL   RBC 2.79 (L) 3.87 - 5.11 MIL/uL   Hemoglobin 8.8 (L) 12.0 - 15.0 g/dL   HCT 73.7 (L) 63.9 - 53.9 %   MCV 93.9 80.0 - 100.0 fL   MCH 31.5 26.0 - 34.0 pg   MCHC 33.6 30.0 - 36.0 g/dL   RDW 84.2 (H) 88.4 - 84.4 %   Platelets 330 150 - 400 K/uL   nRBC 0.0 0.0 - 0.2 %   Neutrophils Relative % 86 %   Neutro Abs 14.8 (H) 1.7 - 7.7 K/uL   Lymphocytes Relative 5 %   Lymphs Abs 0.8 0.7 - 4.0 K/uL   Monocytes Relative 7 %   Monocytes Absolute 1.3 (H) 0.1 - 1.0 K/uL   Eosinophils Relative 0 %   Eosinophils Absolute 0.1 0.0 - 0.5 K/uL   Basophils Relative 0 %   Basophils Absolute 0.0 0.0 - 0.1 K/uL   Immature Granulocytes 2 %   Abs Immature Granulocytes 0.29 (H) 0.00 - 0.07 K/uL    Comment: Performed at [facility], [address] [place], [place] [address]  Lactic acid, plasma     Status: None   Collection Time: 12/13/23   1:58 PM  Result Value Ref Range   Lactic Acid, Venous 0.9 0.5 - 1.9 mmol/L    Comment: Performed at [facility], [address], [place], [place] [address]   CT ABDOMEN PELVIS WO CONTRAST Addendum Date: 12/13/2023 ADDENDUM REPORT: 12/13/2023 14:05 ADDENDUM: The original report was by Dr. Ryan Salvage. The following addendum is by Dr. Ryan Salvage: Critical Value/emergent results were called by telephone at the time of interpretation on 12/13/2023 at 2:05 pm to provider [address] [facility] , who verbally acknowledged these results. Electronically Signed   By: Ryan Salvage M.D.   On: 12/13/2023 14:05   Result Date: 12/13/2023 CLINICAL DATA:  Invasive breast cancer. Periumbilical hernia with discoloration and drainage. Abdominal pain. End-stage renal disease on hemodialysis. * Tracking Code: BO * EXAM: CT ABDOMEN AND PELVIS WITHOUT CONTRAST TECHNIQUE: Multidetector CT imaging of the abdomen and pelvis was performed following the standard protocol without IV contrast. RADIATION DOSE REDUCTION: This exam was performed according to the departmental dose-optimization program which includes automated exposure control, adjustment of the mA and/or kV according to patient size and/or use of iterative reconstruction technique. COMPARISON:  11/19/2023 FINDINGS: Lower chest: Mild cardiomegaly. Coronary and aortic atherosclerosis. Hepatobiliary: Dependent density in the gallbladder favoring small gallstones. Otherwise unremarkable. Pancreas: Unremarkable Spleen: Unremarkable Adrenals/Urinary Tract: Stable appearance of multiple renal cysts of varying complexity. Atrophic right kidney. Stable mild nodularity of the left adrenal gland, internal density 28 Hounsfield units which is nonspecific. Stomach/Bowel: The left paracentral umbilical hernia contains a knuckle of transverse colon or a Richter hernia with surrounding abnormal extraluminal gas in the hernia favoring perforated viscus. The  extraluminal gas is all contained to the hernia sac which measures about 5.6 by 4.4 cm on image 47 series 2. No current bowel dilatation although there is prominence of stool throughout the colon. Sigmoid colon diverticulosis. Postoperative findings in the right colon. Vascular/Lymphatic: Atherosclerosis is present, including aortoiliac atherosclerotic disease. Atheromatous plaque at  the origin of the celiac trunk and SMA. Enlarged precaval node 1.2 cm in short axis on image 35 series 2, formerly 1.3 cm. Reproductive: Uterus absent. Other: No supplemental non-categorized findings. Musculoskeletal: In addition to the left paraumbilical hernia, there is a left Spigelian or lateral abdominal wall hernia the level of the umbilicus containing adipose tissue shown on image 88 series 5. There is also laxity and potentially mild focal hernia of the right lateral abdominal wall musculature containing part of the terminal ileum on image 50 series 2. Mild grade 1 degenerative anterolisthesis at L4-5. IMPRESSION: 1. The left paracentral umbilical hernia contains a knuckle of transverse colon or a Richter hernia with surrounding abnormal extraluminal gas in the hernia sac indicating perforated viscus. The extraluminal gas is all contained to the hernia sac which measures about 5.6 by 4.4 cm. 2. There is also a left Spigelian or lateral abdominal wall hernia the level of the umbilicus containing adipose tissue. There is also laxity and potentially mild focal hernia of the right lateral abdominal wall musculature containing part of the terminal ileum. 3. Stable appearance of multiple renal cysts of varying complexity. Atrophic right kidney. 4. Stable mild nodularity of the left adrenal gland, internal density 28 Hounsfield units which is nonspecific. 5. Cholelithiasis. 6. Sigmoid colon diverticulosis. 7. Mild grade 1 degenerative anterolisthesis at L4-5. 8.  Aortic Atherosclerosis (ICD10-I70.0). Radiology assistant personnel have  been notified to put me in telephone contact with the referring physician or the referring physician's clinical representative in order to discuss these findings. Once this communication is established I will issue an addendum to this report for documentation purposes. Electronically Signed: By: Ryan Salvage M.D. On: 12/13/2023 13:59      Assessment/Plan Chaka Boyson is a 78 year old female with multiple medical problems, presenting with a ventral incisional hernia containing a loop of perforated transverse colon. I personally reviewed her labs, imaging and notes. I recommended proceeding to the OR this evening for an exploratory laparotomy and colon resection, with a possible colostomy. Will plan to close her hernia primarily without mesh given the presence of contamination. I reviewed the planned procedure with the patient, including the likelihood of a colostomy and the possibility that it would be permanent given her medical comorbidities. She expressed understanding and consents to proceed with surgery. All questions were answered. - Continue antibiotics - NPO - Medical admission request  This care required a high  level of medical decision making.   Leonor Dawn, MD [facility] Surgery 12/13/2023, 5:39 PM Please see Amion for pager number during day hours 7:00am-4:30pm

## 2023-12-13 NOTE — ED Provider Notes (Signed)
 Patient arrived to obtain transfer from [facility].  Umbilical hernia with perforation.  Zosyn  already given.  General surgery aware.  Team at [facility] working on medicine admission.   She said has been bothering her for a few days, saw her PCP he put her on some doxycycline  for possible infection.  Tuesday Thursday Saturday dialysis, went to dialysis today but did not receive. Physical Exam  BP (!) 158/72   Pulse 96   Temp 98.2 F (36.8 C) (Oral)   Resp 17   SpO2 94%   Physical Exam  Procedures  Procedures  ED Course / MDM   Clinical Course as of 12/13/23 1650  Thu Dec 13, 2023  1404 Call taken from radiology Dr.Liebkemann.  CT abdomen pelvis shows left paracentral umbilical hernia containing knuckle of transverse colon with extraluminal gas indicating perforated viscus.  Paged surgery.  Antibiotic coverage with Zosyn . [MP]  1414 Callback from surgery on-call.  We will need to reach out to PA on-call as attending surgeon on-call is currently in a case [MP]  1446 Discussed with PA Burnard Louder who recommends ED to ED transfer. Dr Ula is [facility] ED accepting. Patient will likely require medical admission given extensive comorbidities. Paged hospitalist [MP]  1455 Informed patient of need for transfer to [facility].  She remained hemodynamically stable at this time. [MP]  1557 Awaiting callback from hospitalist service.  Patient remains hemodynamically stable at this time  I, Ozell Marine DO, am transitioning care of this patient to the oncoming provider pending admission call with hospitalist, continued observation in [facility] ED awaiting transfer to [facility] ED [MP]    Clinical Course User Index [MP] Marine Ozell LABOR, DO   Medical Decision Making Amount and/or Complexity of Data Reviewed Labs: ordered. Radiology: ordered.  Risk Prescription drug management.   Abdomen is mildly distended and tender.  There is an approximate 2 cm eschar area near her umbilicus with some  surrounding erythema.  5:10 PM.  Discussed with general surgery Dr. Dasie who will evaluate patient.  Anticipates will need OR tonight.  7pm discussed with [facility] hospitalist who will see for admission. Also secure messaged nephrology Dr Norine who will arrange for dialysis tomorrow.    Towana Ozell BROCKS, MD 12/13/23 [phone]

## 2023-12-13 NOTE — ED Notes (Signed)
 Verneita [phone] - Trusted contact as per patient.

## 2023-12-13 NOTE — ED Triage Notes (Signed)
 Patient states abdominal hernia. Seen by her doctor for same recently and placed on doxycycline . States today she noticed it was blackened and began draining fluid.

## 2023-12-13 NOTE — ED Notes (Signed)
 Zachary with cl called, pt to go to [facility] ED/ Dr Ula

## 2023-12-13 NOTE — Transfer of Care (Signed)
 Immediate Anesthesia Transfer of Care Note  Patient: Rachel Villarreal  Procedure(s) Performed: LAPAROTOMY, EXPLORATORY; COLON RESECTION CREATION, COLOSTOMY (Abdomen)  Patient Location: PACU  Anesthesia Type:General  Level of Consciousness: awake and alert   Airway & Oxygen Therapy: Patient Spontanous Breathing and Patient connected to nasal cannula oxygen  Post-op Assessment: Report given to RN and Post -op Vital signs reviewed and stable  Post vital signs: Reviewed and stable  Last Vitals:  Vitals Value Taken Time  BP 148/68 12/13/23 21:45  Temp 36.6 C 12/13/23 21:44  Pulse 88 12/13/23 21:51  Resp 19 12/13/23 21:51  SpO2 97 % 12/13/23 21:51  Vitals shown include unfiled device data.  Last Pain:  Vitals:   12/13/23 2144  TempSrc:   PainSc: 0-No pain         Complications: No notable events documented.

## 2023-12-13 NOTE — Op Note (Addendum)
 Date: 12/13/23  Patient: Rachel Villarreal MRN: [id number]  Preoperative Diagnosis: Incarcerated ventral incisional hernia with colonic perforation Postoperative Diagnosis: Same  Procedure:  Exploratory laparotomy Segmental transverse colon resection with creation of end transverse colostomy Abdominal wall debridement Ventral incisional hernia repair with Vicryl mesh  Surgeon: Leonor Dawn, MD  EBL: 50 mL  Anesthesia: General endotracheal  Specimens: Transverse colon  Indications: Rachel Villarreal is a 78 yo female with known ventral incisional hernias, who presented to the ED from dialysis with pain at one of the hernia sites, and overlying skin redness with necrosis. A CT scan showed a loop of colon within the hernia, with evidence of perforation. After a discussion of the risks and benefits of surgery, she consented to proceed with abdominal exploration.  Findings: Multiple ventral hernia defects, with a loop of transverse colon with the periumbilical hernia, with perforation and fistulization to the abdominal wall. There was extensive surrounding necrotic subcutaneous tissue. Involved segment of colon was resected (approximately 15cm) and the fascia and subcutaneous tissue were debrided. Absorbable Vicryl mesh was placed deep to the fascia given the poor tissue quality. The distal colonic stump was marked with a Novafil suture.  Procedure details: Informed consent was obtained in the preoperative area prior to the procedure. The patient was brought to the operating room and placed on the table in the supine position. General anesthesia was induced and appropriate lines and drains were placed for intraoperative monitoring. Perioperative antibiotics were administered per SCIP guidelines. The abdomen was prepped and draped in the usual sterile fashion. A pre-procedure timeout was taken verifying patient identity, surgical site and procedure to be performed.  A midline skin incision was made at  the umbilicus and extended superiorly and inferiorly. The underlying subcutaneous tissue was necrotic with turbid fluid. The subcutaneous tissue was opened to expose the fascia. There was a fascial defect, with feculent material emanating from this, with visible colonic mucosa at the defect. On palpating the defect, it was clear the colon had perforated and fistulized to the abdominal wall. The fascia was further opened with cautery and there was transverse colon adherent to the fascia. There were several additional smaller fascial defects at midline, and a thickened and inflamed umbilical hernia sac. This hernia sac was excised with cautery and discarded. The colon was sharply taken down from the abdominal wall. There were some small bowel adhesions to the colon, which were sharply divided. There were omental adhesions to the surrounding fascia, which were taken down using sharp dissection and cautery. Once the abdominal wall was freed, the transverse colon was examined. There was a large perforation at the site of the hernia. Proximal and distal to this, the colon appeared normal. There was extensive subcutaneous tissue necrosis, and some of the fascia was also necrotic and of poor quality. As the patient had multiple significant medical comorbidities and poor tissue quality with a significant wound infection, I did not feel it was safe to perform a colonic anastomosis and elected to create a colostomy. Mesenteric windows were bluntly created proximal and distal to the site of the perforation, which appeared to be on the mid-transverse colon. The colon was divided at each point using a 75mm GIA stapler with a blue load. The intervening mesentery was divided with Ligasure. The specimen was passed off the field and sent for routine pathology. A length of approximately 15cm of colon was resected. The abdomen was irrigated. The more proximal transverse colon still had some adhesions to the abdominal wall, and  was  further mobilized off the abdominal wall using sharp dissection. At this point the end of the transverse colon reached the abdominal wall in the RUQ without tension. There was an additional hernia defect palpable in the left lateral abdomen, which measured approximately 3cm in diameter and contained only omental fat, which was reduced. This defect was closed primarily from inside the abdomen using interrupted 0 Novafil sutures. The necrotic edges of the fascia at midline were debrided using cautery. There was extensive necrosis of the subcutaneous tissue, which was debrided back to healthy bleeding tissue using cautery. The overlying necrotic skin was also excised.  A site for the colostomy was then selected on the RUQ abdominal wall overlying the rectus muscle. The end of the proximal transverse colon reached this point without tension. A circular skin incision was made with cautery and the subcutaneous tissue was cored out with cautery to expose the fascia. The anterior rectus sheath was vertically incised, the rectus muscle was spread, and the posterior rectus sheath was vertically incised with cautery. The end of the colon was passed through the opening, taking care not to twist the mesentery, and reached the skin easily without tension.   As the fascia had required debridement and appeared to have poor tissue quality, I felt the patient was at high risk of fascial dehiscence, and thus elected to place an absorbable mesh underlay to provide a wound matrix in the event of dehiscence. A sheet of Vicryl mesh was brought onto the field and cut to an appropriate size to cover the entire fascial opening with about 2cm of overlap circumferentially. The incision was approximately 15cm in length. The fascia was secured to the mesh circumferentially using 0 Vicryl sutures. The fascia was then closed at midline over the mesh using interrupted 0 Novafil figure-of-eight sutures. The wound was irrigated with sterile  saline and appeared hemostatic. Superiorly and inferiorly in the wound, a single 0 Vicryl suture was placed to loosely bring the subcutaneous tissue together. The remainder of the subcutaneous tissue was left completely open and packed with saline-moistened kerlex. The colostomy was matured in [place] fashion using 3-0 Vicryl sutures. A colostomy appliance was placed.  Upon entering the abdomen (organ space), I encountered infection of the subcutaneous space and abdominal wall fascia.  CASE DATA:  Type of patient?: DOW CASE (Surgical Hospitalist [facility] Inpatient)  Status of Case? EMERGENT Add On  Infection Present At Time Of Surgery (PATOS)?  ABSCESS in the subcutaneous tissue with necrosis of the fascia     The patient tolerated the procedure well with no apparent complications. All counts were correct x2 at the end of the procedure. The patient was extubated and taken to PACU in stable condition.  Leonor Dawn, MD 12/13/23 9:35 PM

## 2023-12-13 NOTE — ED Notes (Signed)
 Patient reports last oral intake 0700 today (12/13/2023) of chocolate croissant and black coffee.

## 2023-12-13 NOTE — ED Notes (Signed)
 Pt coming from [facility] with a perforated abdomen Pt with intermittent pain but denies any right now.   Last ate at 0700 this morning.  Was due for dialysis today but was sent to the ED instead due to a wound on her abdomen that was black in color. No fevers. No SHOB at present.

## 2023-12-13 NOTE — ED Provider Notes (Signed)
 [place] EMERGENCY DEPARTMENT AT [facility] Provider Note   CSN: [id number] Arrival date & time: 12/13/23  1048     Patient presents with: Wound Check   Rachel Villarreal is a 78 y.o. female.  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  Patient has a known left periumbilical hernia which has been causing her more discomfort over the last 2 weeks.  Beginning about a week ago she noticed that the area was more red and she was seen by her primary care doctor for this issue who started her on doxycycline  given concern for potential abdominal cellulitis.  In the last 24 hours she has had increased discomfort and noticed a black discoloration with some dark-colored drainage from the wound prompting her primary care doctor directed her to the ED for further evaluation.  She did report some nausea last night but no vomiting diarrhea or changes in bowel habits.  No fevers chills chest pain or shortness of breath.  She was not able to undergo dialysis as scheduled today due to this acute issue.    Wound Check       Prior to Admission medications   Medication Sig Start Date End Date Taking? Authorizing Provider  acetaminophen  (TYLENOL ) 500 MG tablet Take 500 mg by mouth every 6 (six) hours as needed for mild pain (pain score 1-3).   Yes [provider]  anastrozole  (ARIMIDEX ) 1 MG tablet TAKE 1 TABLET BY MOUTH EVERY DAY 11/15/23  Yes Gudena, Vinay, MD  atorvastatin  (LIPITOR ) 80 MG tablet Take 1 tablet (80 mg total) by mouth in the morning. 06/05/23  Yes Chandrasekhar, Mahesh A, MD  budesonide  (RHINOCORT  AQUA) 32 MCG/ACT nasal spray Place 2 sprays into both nostrils in the morning. 12/12/22  Yes [provider]  Cholecalciferol 50 MCG (2000 UT) CAPS Take 2,000 Units by mouth in the morning. 12/27/22  Yes [provider]   diclofenac  Sodium (VOLTAREN ) 1 % GEL Apply 1 Application topically 4 (four) times daily as needed (pain.).   Yes [provider]  docusate sodium  (COLACE) 100 MG capsule Take 100 mg by mouth daily as needed for mild constipation.   Yes [provider]  doxycycline  (VIBRA -TABS) 100 MG tablet Take 100 mg by mouth 2 (two) times daily. 12/11/23  Yes [provider]  eszopiclone (LUNESTA) 2 MG TABS tablet Take 2 mg by mouth at bedtime. 03/27/23  Yes [provider]  febuxostat  (ULORIC ) 40 MG tablet Take 40 mg by mouth in the morning.   Yes [provider]  furosemide  (LASIX ) 80 MG tablet Take 80 mg by mouth See admin instructions. Take 1 tablet by mouth on non-dialysis days, Mon, Wed, Fri, Sun 09/01/23  Yes [provider]  loratadine  (CLARITIN ) 10 MG tablet Take 10 mg by mouth 2 (two) times daily. 12/12/22  Yes [provider]  Methoxy PEG-Epoetin  Beta (MIRCERA IJ) 60 mcg. 08/07/23 08/05/24 Yes [provider]  metoprolol  succinate (TOPROL -XL) 100 MG 24 hr tablet Take 0.5-1 tablet (50-100 mg total) by mouth daily as needed for elevated heart rate. Take with or immediately following a meal. Patient taking differently: Take 50 mg by mouth every evening. 06/05/23  Yes Chandrasekhar, Mahesh A, MD  Multiple Vitamins-Minerals (PRESERVISION AREDS 2+MULTI VIT PO) Take 1 capsule by mouth 2 (two) times daily.   Yes [provider]  ondansetron  (ZOFRAN ) 4 MG tablet  Take 4 mg by mouth See admin instructions. Take 1 tablet by mouth on dialysis days, during dialysis 11/13/23  Yes [provider]  pantoprazole  (PROTONIX ) 40 MG tablet Take 1 tablet (40 mg total) by mouth daily. 06/05/23  Yes Chandrasekhar, Mahesh A, MD  PROAIR  HFA 108 (90 BASE) MCG/ACT inhaler Inhale 1-2 puffs into the lungs every 6 (six) hours as needed for shortness of breath or wheezing (for respiratory issues.). 12/03/14  Yes [provider]  rOPINIRole  (REQUIP )  0.5 MG tablet Take 1 tablet (0.5 mg total) by mouth every 12 (twelve) hours as needed (Restless legs). Patient taking differently: Take 0.5 mg by mouth in the morning and at bedtime. 04/25/23  Yes Jillian Buttery, MD  sevelamer  carbonate (RENVELA ) 800 MG tablet Take 1,600 mg by mouth 3 (three) times daily with meals. 03/15/23  Yes [provider]    Allergies: Ace inhibitors, Lactose intolerance (gi), Letrozole , Lisinopril, Mercury, and Tape    Review of Systems  Updated Vital Signs BP (!) 158/72   Pulse 96   Temp 98.2 F (36.8 C) (Oral)   Resp 17   SpO2 94%   Physical Exam Vitals and nursing note reviewed.  HENT:     Head: Normocephalic and atraumatic.  Eyes:     Pupils: Pupils are equal, round, and reactive to light.  Cardiovascular:     Rate and Rhythm: Normal rate and regular rhythm.  Pulmonary:     Effort: Pulmonary effort is normal.     Breath sounds: Normal breath sounds.  Abdominal:     Palpations: Abdomen is soft.     Tenderness: There is no abdominal tenderness.     Comments: Large patch of erythema approximately 5 x 6 cm over left periumbilical region Smaller area of black discoloration with serosanguineous appearing drainage within the center of this erythema No abdominal rigidity or rebound tenderness   Skin:    General: Skin is warm and dry.  Neurological:     Mental Status: She is alert.  Psychiatric:        Mood and Affect: Mood normal.     (all labs ordered are listed, but only abnormal results are displayed) Labs Reviewed  COMPREHENSIVE METABOLIC PANEL WITH GFR - Abnormal; Notable for the following components:      Result Value   Potassium 3.4 (*)    Chloride 96 (*)    Glucose, Bld 105 (*)    BUN 57 (*)    Creatinine, Ser 7.15 (*)    Total Protein 5.9 (*)    Albumin  3.2 (*)    AST 13 (*)    Alkaline Phosphatase 132 (*)    GFR, Estimated 5 (*)    Anion gap 17 (*)    All other components within normal limits  CBC WITH  DIFFERENTIAL/PLATELET - Abnormal; Notable for the following components:   WBC 17.2 (*)    RBC 2.79 (*)    Hemoglobin 8.8 (*)    HCT 26.2 (*)    RDW 15.7 (*)    Neutro Abs 14.8 (*)    Monocytes Absolute 1.3 (*)    Abs Immature Granulocytes 0.29 (*)    All other components within normal limits  CULTURE, BLOOD (ROUTINE X 2)  CULTURE, BLOOD (ROUTINE X 2)  LACTIC ACID, PLASMA  LACTIC ACID, PLASMA    EKG: None  Radiology: CT ABDOMEN PELVIS WO CONTRAST Addendum Date: 12/13/2023 ADDENDUM REPORT: 12/13/2023 14:05 ADDENDUM: The original report was by Dr. Ryan Salvage. The following addendum is  by Dr. Ryan Salvage: Critical Value/emergent results were called by telephone at the time of interpretation on 12/13/2023 at 2:05 pm to provider [facility] , who verbally acknowledged these results. Electronically Signed   By: Ryan Salvage M.D.   On: 12/13/2023 14:05   Result Date: 12/13/2023 CLINICAL DATA:  Invasive breast cancer. Periumbilical hernia with discoloration and drainage. Abdominal pain. End-stage renal disease on hemodialysis. * Tracking Code: BO * EXAM: CT ABDOMEN AND PELVIS WITHOUT CONTRAST TECHNIQUE: Multidetector CT imaging of the abdomen and pelvis was performed following the standard protocol without IV contrast. RADIATION DOSE REDUCTION: This exam was performed according to the departmental dose-optimization program which includes automated exposure control, adjustment of the mA and/or kV according to patient size and/or use of iterative reconstruction technique. COMPARISON:  11/19/2023 FINDINGS: Lower chest: Mild cardiomegaly. Coronary and aortic atherosclerosis. Hepatobiliary: Dependent density in the gallbladder favoring small gallstones. Otherwise unremarkable. Pancreas: Unremarkable Spleen: Unremarkable Adrenals/Urinary Tract: Stable appearance of multiple renal cysts of varying complexity. Atrophic right kidney. Stable mild nodularity of the left adrenal gland, internal  density 28 Hounsfield units which is nonspecific. Stomach/Bowel: The left paracentral umbilical hernia contains a knuckle of transverse colon or a Richter hernia with surrounding abnormal extraluminal gas in the hernia favoring perforated viscus. The extraluminal gas is all contained to the hernia sac which measures about 5.6 by 4.4 cm on image 47 series 2. No current bowel dilatation although there is prominence of stool throughout the colon. Sigmoid colon diverticulosis. Postoperative findings in the right colon. Vascular/Lymphatic: Atherosclerosis is present, including aortoiliac atherosclerotic disease. Atheromatous plaque at the origin of the celiac trunk and SMA. Enlarged precaval node 1.2 cm in short axis on image 35 series 2, formerly 1.3 cm. Reproductive: Uterus absent. Other: No supplemental non-categorized findings. Musculoskeletal: In addition to the left paraumbilical hernia, there is a left Spigelian or lateral abdominal wall hernia the level of the umbilicus containing adipose tissue shown on image 88 series 5. There is also laxity and potentially mild focal hernia of the right lateral abdominal wall musculature containing part of the terminal ileum on image 50 series 2. Mild grade 1 degenerative anterolisthesis at L4-5. IMPRESSION: 1. The left paracentral umbilical hernia contains a knuckle of transverse colon or a Richter hernia with surrounding abnormal extraluminal gas in the hernia sac indicating perforated viscus. The extraluminal gas is all contained to the hernia sac which measures about 5.6 by 4.4 cm. 2. There is also a left Spigelian or lateral abdominal wall hernia the level of the umbilicus containing adipose tissue. There is also laxity and potentially mild focal hernia of the right lateral abdominal wall musculature containing part of the terminal ileum. 3. Stable appearance of multiple renal cysts of varying complexity. Atrophic right kidney. 4. Stable mild nodularity of the left  adrenal gland, internal density 28 Hounsfield units which is nonspecific. 5. Cholelithiasis. 6. Sigmoid colon diverticulosis. 7. Mild grade 1 degenerative anterolisthesis at L4-5. 8.  Aortic Atherosclerosis (ICD10-I70.0). Radiology assistant personnel have been notified to put me in telephone contact with the referring physician or the referring physician's clinical representative in order to discuss these findings. Once this communication is established I will issue an addendum to this report for documentation purposes. Electronically Signed: By: Ryan Salvage M.D. On: 12/13/2023 13:59     Procedures   Medications Ordered in the ED  0.9 %  sodium chloride  infusion ( Intravenous New Bag/Given 12/13/23 1442)  piperacillin -tazobactam (ZOSYN ) IVPB 3.375 g (3.375 g Intravenous New Bag/Given 12/13/23  1442)  sodium chloride  0.9 % bolus 1,000 mL (1,000 mLs Intravenous New Bag/Given 12/13/23 1443)    Clinical Course as of 12/13/23 1557  Thu Dec 13, 2023  1404 Call taken from radiology Dr.Liebkemann.  CT abdomen pelvis shows left paracentral umbilical hernia containing knuckle of transverse colon with extraluminal gas indicating perforated viscus.  Paged surgery.  Antibiotic coverage with Zosyn . [MP]  1414 Callback from surgery on-call.  We will need to reach out to PA on-call as attending surgeon on-call is currently in a case [MP]  1446 Discussed with PA Burnard Louder who recommends ED to ED transfer. Dr Ula is [facility] ED accepting. Patient will likely require medical admission given extensive comorbidities. Paged hospitalist [MP]  1455 Informed patient of need for transfer to [facility].  She remained hemodynamically stable at this time. [MP]  1557 Awaiting callback from hospitalist service.  Patient remains hemodynamically stable at this time  I, Ozell Marine DO, am transitioning care of this patient to the oncoming provider pending admission call with hospitalist, continued observation in [facility]  ED awaiting transfer to [facility], ED [MP]    Clinical Course User Index [MP] Marine Ozell LABOR, DO                                 Medical Decision Making 78 year old female with history as above presenting given concern for acute ischemic changes and increasing discomfort from known left periumbilical hernia.  Exam is concerning for overlying skin changes over the area of known hernia.  There is an area of black discoloration with active drainage.  Considering the skin changes I did not attempt to reduce today hernia manually.  Concerning for strangulation/incarceration.  Will obtain laboratory workup including venous lactate and blood cultures and obtain CT abdomen pelvis.  Will obtain CT abdomen pelvis without contrast with her history of ESRD.  She did not undergo dialysis today.  Admission likely.  Amount and/or Complexity of Data Reviewed Labs: ordered. Radiology: ordered.  Risk Prescription drug management.        Final diagnoses:  Perforated abdominal viscus    ED Discharge Orders     None          Marine Ozell LABOR, DO 12/13/23 1558

## 2023-12-13 NOTE — Anesthesia Preprocedure Evaluation (Signed)
 Anesthesia Evaluation  Patient identified by MRN, date of birth, ID band Patient awake    Reviewed: Allergy & Precautions, H&P , NPO status , Patient's Chart, lab work & pertinent test results  Airway Mallampati: II   Neck ROM: full    Dental   Pulmonary asthma , former smoker   breath sounds clear to auscultation       Cardiovascular hypertension, + CAD  + dysrhythmias Atrial Fibrillation  Rhythm:irregular Rate:Normal     Neuro/Psych    GI/Hepatic ,GERD  ,,Bowel perforation   Endo/Other    Renal/GU ESRFRenal disease     Musculoskeletal   Abdominal   Peds  Hematology   Anesthesia Other Findings   Reproductive/Obstetrics                              Anesthesia Physical Anesthesia Plan  ASA: 3 and emergent  Anesthesia Plan: General   Post-op Pain Management:    Induction: Intravenous  PONV Risk Score and Plan: 3 and Ondansetron , Dexamethasone  and Treatment may vary due to age or medical condition  Airway Management Planned: Oral ETT  Additional Equipment:   Intra-op Plan:   Post-operative Plan: Possible Post-op intubation/ventilation  Informed Consent: I have reviewed the patients History and Physical, chart, labs and discussed the procedure including the risks, benefits and alternatives for the proposed anesthesia with the patient or authorized representative who has indicated his/her understanding and acceptance.     Dental advisory given  Plan Discussed with: CRNA, Anesthesiologist and Surgeon  Anesthesia Plan Comments:         Anesthesia Quick Evaluation

## 2023-12-13 NOTE — Anesthesia Procedure Notes (Signed)
 Procedure Name: Intubation Date/Time: 12/13/2023 7:21 PM  Performed by: Roddie Grate, CRNAPre-anesthesia Checklist: Patient identified, Emergency Drugs available, Suction available, Patient being monitored and Timeout performed Patient Re-evaluated:Patient Re-evaluated prior to induction Oxygen Delivery Method: Circle system utilized Preoxygenation: Pre-oxygenation with 100% oxygen Induction Type: IV induction Ventilation: Mask ventilation without difficulty Laryngoscope Size: Mac and 3 Grade View: Grade I Tube type: Oral Tube size: 7.0 mm Number of attempts: 1 Airway Equipment and Method: Stylet Placement Confirmation: ETT inserted through vocal cords under direct vision, positive ETCO2 and breath sounds checked- equal and bilateral Secured at: 22 cm Tube secured with: Tape Dental Injury: Teeth and Oropharynx as per pre-operative assessment  Comments: Smooth IV Induction. Eyes taped. Easy mask. DL x 1 with grade 1 view. Atraumatically placed, teeth and lip remain intact as pre-op . Secured with tape. Bilateral breath sounds +/=, EtCO2 +, Adequate TV, VSS.

## 2023-12-14 ENCOUNTER — Encounter (HOSPITAL_COMMUNITY): Payer: Self-pay | Admitting: Surgery

## 2023-12-14 ENCOUNTER — Inpatient Hospital Stay (HOSPITAL_COMMUNITY)

## 2023-12-14 DIAGNOSIS — N186 End stage renal disease: Secondary | ICD-10-CM | POA: Diagnosis not present

## 2023-12-14 DIAGNOSIS — I609 Nontraumatic subarachnoid hemorrhage, unspecified: Secondary | ICD-10-CM | POA: Diagnosis not present

## 2023-12-14 DIAGNOSIS — I480 Paroxysmal atrial fibrillation: Secondary | ICD-10-CM

## 2023-12-14 DIAGNOSIS — I4892 Unspecified atrial flutter: Secondary | ICD-10-CM | POA: Insufficient documentation

## 2023-12-14 DIAGNOSIS — Z170 Estrogen receptor positive status [ER+]: Secondary | ICD-10-CM

## 2023-12-14 DIAGNOSIS — Z992 Dependence on renal dialysis: Secondary | ICD-10-CM

## 2023-12-14 DIAGNOSIS — C50412 Malignant neoplasm of upper-outer quadrant of left female breast: Secondary | ICD-10-CM

## 2023-12-14 DIAGNOSIS — K631 Perforation of intestine (nontraumatic): Secondary | ICD-10-CM

## 2023-12-14 LAB — BASIC METABOLIC PANEL WITH GFR
Anion gap: 17 — ABNORMAL HIGH (ref 5–15)
BUN: 67 mg/dL — ABNORMAL HIGH (ref 8–23)
CO2: 23 mmol/L (ref 22–32)
Calcium: 9.1 mg/dL (ref 8.9–10.3)
Chloride: 99 mmol/L (ref 98–111)
Creatinine, Ser: 7.91 mg/dL — ABNORMAL HIGH (ref 0.44–1.00)
GFR, Estimated: 5 mL/min — ABNORMAL LOW (ref >60)
Glucose, Bld: 145 mg/dL — ABNORMAL HIGH (ref 70–99)
Potassium: 4.3 mmol/L (ref 3.5–5.1)
Sodium: 139 mmol/L (ref 135–145)

## 2023-12-14 LAB — CBC
HCT: 26.6 % — ABNORMAL LOW (ref 36.0–46.0)
Hemoglobin: 8.9 g/dL — ABNORMAL LOW (ref 12.0–15.0)
MCH: 31.6 pg (ref 26.0–34.0)
MCHC: 33.5 g/dL (ref 30.0–36.0)
MCV: 94.3 fL (ref 80.0–100.0)
Platelets: 347 K/uL (ref 150–400)
RBC: 2.82 MIL/uL — ABNORMAL LOW (ref 3.87–5.11)
RDW: 15.5 % (ref 11.5–15.5)
WBC: 17.4 K/uL — ABNORMAL HIGH (ref 4.0–10.5)
nRBC: 0 % (ref 0.0–0.2)

## 2023-12-14 LAB — HEPATITIS B SURFACE ANTIGEN: Hepatitis B Surface Ag: NONREACTIVE

## 2023-12-14 LAB — MAGNESIUM: Magnesium: 1.8 mg/dL (ref 1.7–2.4)

## 2023-12-14 MED ORDER — ACETAMINOPHEN 500 MG PO TABS
1000.0000 mg | ORAL_TABLET | Freq: Four times a day (QID) | ORAL | Status: DC
  Administered 2023-12-14 – 2023-12-28 (×46): 1000 mg via ORAL
  Filled 2023-12-14 (×48): qty 2

## 2023-12-14 MED ORDER — CHLORHEXIDINE GLUCONATE CLOTH 2 % EX PADS
6.0000 | MEDICATED_PAD | Freq: Every day | CUTANEOUS | Status: DC
  Administered 2023-12-15 – 2023-12-16 (×2): 6 via TOPICAL

## 2023-12-14 MED ORDER — ZOLPIDEM TARTRATE 5 MG PO TABS
5.0000 mg | ORAL_TABLET | Freq: Every evening | ORAL | Status: DC | PRN
  Administered 2023-12-17 – 2023-12-27 (×12): 5 mg via ORAL
  Filled 2023-12-14 (×13): qty 1

## 2023-12-14 MED ORDER — METHOCARBAMOL 500 MG PO TABS
500.0000 mg | ORAL_TABLET | Freq: Three times a day (TID) | ORAL | Status: DC
  Administered 2023-12-14 – 2023-12-17 (×9): 500 mg via ORAL
  Filled 2023-12-14 (×11): qty 1

## 2023-12-14 MED ORDER — HYDROMORPHONE HCL 1 MG/ML IJ SOLN
0.2500 mg | Freq: Once | INTRAMUSCULAR | Status: AC
  Administered 2023-12-14: 0.25 mg via INTRAVENOUS
  Filled 2023-12-14: qty 0.5

## 2023-12-14 MED ORDER — SEVELAMER CARBONATE 800 MG PO TABS
1600.0000 mg | ORAL_TABLET | Freq: Three times a day (TID) | ORAL | Status: DC
  Administered 2023-12-15 – 2023-12-28 (×31): 1600 mg via ORAL
  Filled 2023-12-14 (×34): qty 2

## 2023-12-14 MED ORDER — ACETAMINOPHEN 325 MG PO TABS: 650.0000 mg | ORAL_TABLET | Freq: Four times a day (QID) | ORAL | Status: DC | PRN

## 2023-12-14 MED ORDER — ONDANSETRON HCL 4 MG/2ML IJ SOLN
INTRAMUSCULAR | Status: AC
  Filled 2023-12-14: qty 2

## 2023-12-14 MED ORDER — ONDANSETRON HCL 4 MG/2ML IJ SOLN
4.0000 mg | Freq: Four times a day (QID) | INTRAMUSCULAR | Status: DC | PRN
  Administered 2023-12-14 – 2023-12-15 (×2): 4 mg via INTRAVENOUS

## 2023-12-14 MED ORDER — FEBUXOSTAT 40 MG PO TABS
40.0000 mg | ORAL_TABLET | Freq: Every morning | ORAL | Status: DC
  Administered 2023-12-14 – 2023-12-28 (×15): 40 mg via ORAL
  Filled 2023-12-14 (×16): qty 1

## 2023-12-14 MED ORDER — ACETAMINOPHEN 650 MG RE SUPP
650.0000 mg | Freq: Four times a day (QID) | RECTAL | Status: DC | PRN
Start: 2023-12-14 — End: 2023-12-14

## 2023-12-14 MED ORDER — HEPARIN SODIUM (PORCINE) 5000 UNIT/ML IJ SOLN
5000.0000 [IU] | Freq: Three times a day (TID) | INTRAMUSCULAR | Status: DC
  Administered 2023-12-14 – 2023-12-28 (×41): 5000 [IU] via SUBCUTANEOUS
  Filled 2023-12-14 (×41): qty 1

## 2023-12-14 MED ORDER — ROPINIROLE HCL 0.5 MG PO TABS
0.5000 mg | ORAL_TABLET | Freq: Two times a day (BID) | ORAL | Status: DC | PRN
  Administered 2023-12-14 – 2023-12-16 (×5): 0.5 mg via ORAL
  Filled 2023-12-14 (×5): qty 1

## 2023-12-14 MED ORDER — METOPROLOL TARTRATE 25 MG PO TABS
25.0000 mg | ORAL_TABLET | Freq: Two times a day (BID) | ORAL | Status: DC
  Administered 2023-12-14 – 2023-12-24 (×22): 25 mg via ORAL
  Filled 2023-12-14 (×22): qty 1

## 2023-12-14 MED ORDER — ALUM & MAG HYDROXIDE-SIMETH 200-200-20 MG/5ML PO SUSP
30.0000 mL | Freq: Once | ORAL | Status: AC
  Administered 2023-12-14: 30 mL via ORAL
  Filled 2023-12-14: qty 30

## 2023-12-14 MED ORDER — ATORVASTATIN CALCIUM 80 MG PO TABS
80.0000 mg | ORAL_TABLET | Freq: Every morning | ORAL | Status: DC
  Administered 2023-12-14 – 2023-12-28 (×15): 80 mg via ORAL
  Filled 2023-12-14 (×15): qty 1

## 2023-12-14 MED ORDER — PANTOPRAZOLE SODIUM 40 MG PO TBEC
40.0000 mg | DELAYED_RELEASE_TABLET | Freq: Every day | ORAL | Status: DC
  Administered 2023-12-14 – 2023-12-28 (×14): 40 mg via ORAL
  Filled 2023-12-14 (×14): qty 1

## 2023-12-14 MED ORDER — HYDROMORPHONE HCL 1 MG/ML IJ SOLN: 0.5000 mg | INTRAMUSCULAR | Status: DC | PRN

## 2023-12-14 NOTE — Consult Note (Signed)
 WOC Nurse ostomy consult note Stoma type/location: RUQ, end transverse colostomy  Stomal assessment/size: 1 1/2 round, budded, pale, moist, observed though Peristomal assessment: NA; patient from OR last pm 1030 Treatment options for stomal/peristomal skin: NA Output none Ostomy pouching: 2pc. Intact from OR Education provided:  Materials left in room; patient is quite sleepy today during my visit. Today's education may not be valuable to her since she has just come through general anesthesia  Explained role of ostomy nurse and creation of stoma  Provided patient with ostomy pouch (2pc) and we reviewed lock and roll closure Discussed bathing, diet, gas, medication use   Enrolled patient in [employer] DC program: Yes  3 pouchs/barrier/ barrier rings left in the room  WOC Nurse will follow along with you for continued support with ostomy teaching and care Levoy Geisen [facility] MSN, RN, [place], CNS, [employer] [phone]

## 2023-12-14 NOTE — Plan of Care (Signed)
  Problem: Pain Managment: Goal: General experience of comfort will improve and/or be controlled Outcome: Progressing   Problem: Safety: Goal: Ability to remain free from injury will improve Outcome: Progressing

## 2023-12-14 NOTE — Plan of Care (Signed)
  Problem: Clinical Measurements: Goal: Respiratory complications will improve Outcome: Not Progressing   Problem: Clinical Measurements: Goal: Cardiovascular complication will be avoided Outcome: Not Progressing   Problem: Nutrition: Goal: Adequate nutrition will be maintained Outcome: Not Progressing   Problem: Elimination: Goal: Will not experience complications related to bowel motility Outcome: Not Progressing   Problem: Elimination: Goal: Will not experience complications related to urinary retention Outcome: Not Progressing   Problem: Pain Managment: Goal: General experience of comfort will improve and/or be controlled Outcome: Not Progressing   Problem: Safety: Goal: Ability to remain free from injury will improve Outcome: Not Progressing

## 2023-12-14 NOTE — H&P (Signed)
 History and Physical    Patient: Rachel Villarreal FMW:[id number] DOB: [birth year] DOA: 12/13/2023 DOS: the patient was seen and examined on 12/14/2023 PCP: Ransom Other, MD  Patient coming from: Home  Chief Complaint:  Chief Complaint  Patient presents with   Wound Check   HPI: Rachel Villarreal is a 78 y.o. female with medical history significant for ED SRD on Tuesday Thursday Saturday hemodialysis, paroxysmal atrial fibrillation on Eliquis , coronary artery disease, left breast cancer and heart failure with preserved EF who drove herself to [facility] from dialysis because the black area in the middle of her umbilicus opened up and started draining pus right when she was about to start dialysis.  The patient says she developed black area in the middle of her abdomen right in the umbilical area 24 hours earlier.  When she went to dialysis she was going to show with the nurse when she noticed that it had started draining pus.  They were about to start hemodialysis at that time when she said she was can hold off and come to the ER to have her abdomen evaluated first.  The patient had been started on doxycycline  24 hours earlier when the blackened area appeared. She was transferred from [facility] to [facility], ED where she was seen by the surgeon.  The patient went urgently to the operating room. She now has a end transverse colostomy after abdominal wall debridement, colon resection as well as ventral incisional hernia repair.   Review of Systems: As mentioned in the history of present illness. All other systems reviewed and are negative. Past Medical History:  Diagnosis Date   Abnormal Pap smear of vagina    Anemia    Asthma    Mild   Cancer (HCC)    left breast ILC   Chronic kidney disease (CKD)    stage 4   GERD (gastroesophageal reflux disease)    Gout 05/2012   Hernia, incisional    History of abnormal Pap smear    CIN III, 2001   History of blood transfusion    POST OP   History of  uterine fibroid    Hypertension    Peritonitis (HCC)    Respiratory failure (HCC)    after ruptured appendix   Ruptured appendix    Seasonal allergies    Shingles 02/2013   very mild case   Urine incontinence    Past Surgical History:  Procedure Laterality Date   A/V FISTULAGRAM Right 03/19/2023   Procedure: A/V Fistulagram;  Surgeon: Sheree Penne Bruckner, MD;  Location: [facility] INVASIVE CV LAB;  Service: Cardiovascular;  Laterality: Right;   APPENDECTOMY     AV FISTULA PLACEMENT Right 04/24/2022   Procedure: RIGHT BRACHIOCEPHALIC ARTERIOVENOUS (AV) FISTULA CREATION;  Surgeon: Eliza Bruckner RAMAN, MD;  Location: [facility] OR;  Service: Vascular;  Laterality: Right;   BREAST BIOPSY Right    fibrocystic changes   BREAST BIOPSY Left 08/02/2021   BREAST LUMPECTOMY WITH RADIOACTIVE SEED LOCALIZATION Left 09/12/2021   Procedure: LEFT BREAST LUMPECTOMY WITH RADIOACTIVE SEED LOCALIZATION;  Surgeon: Ebbie Cough, MD;  Location: [facility];  Service: General;  Laterality: Left;   CATARACT EXTRACTION Bilateral    COLONOSCOPY     COLONOSCOPY WITH PROPOFOL  N/A 02/18/2018   Procedure: COLONOSCOPY WITH PROPOFOL ;  Surgeon: Rosalie Kitchens, MD;  Location: [facility] ENDOSCOPY;  Service: Endoscopy;  Laterality: N/A;  Use ultraslim scope    FISTULA SUPERFICIALIZATION Right 04/13/2023   Procedure: FISTULA SUPERFICIALIZATION;  Surgeon: Sheree Penne Bruckner,  MD;  Location: [facility] OR;  Service: Vascular;  Laterality: Right;   IR FLUORO GUIDE CV LINE RIGHT  03/10/2023   LEEP  2000   CIN 2/3   RE-EXCISION OF BREAST LUMPECTOMY Left 09/29/2021   Procedure: LEFT  BREAST RE-EXCISION LUMPECTOMY;  Surgeon: Ebbie Cough, MD;  Location: [facility] ORS;  Service: General;  Laterality: Left;   REFRACTIVE SURGERY     RIGHT/LEFT HEART CATH AND CORONARY ANGIOGRAPHY N/A 04/23/2023   Procedure: RIGHT/LEFT HEART CATH AND CORONARY ANGIOGRAPHY;  Surgeon: Swaziland, Peter M, MD;  Location: [facility] INVASIVE CV LAB;  Service:  Cardiovascular;  Laterality: N/A;   SUPRACERVICAL ABDOMINAL HYSTERECTOMY  2001   Social History:  reports that she quit smoking about 51 years ago. Her smoking use included cigarettes. She has never used smokeless tobacco. She reports that she does not drink alcohol and does not use drugs.  Allergies  Allergen Reactions   Ace Inhibitors     angioedema   Lactose Intolerance (Gi) Other (See Comments)    GI upset   Letrozole  Hives   Lisinopril Swelling   Mercury     Red eyes, through eye drops that contained thimerosal    Tape Dermatitis    plastic    Family History  Problem Relation Age of Onset   Colon cancer Father    Breast cancer Neg Hx     Prior to Admission medications   Medication Sig Start Date End Date Taking? Authorizing Provider  acetaminophen  (TYLENOL ) 500 MG tablet Take 500 mg by mouth every 6 (six) hours as needed for mild pain (pain score 1-3).   Yes [provider]  anastrozole  (ARIMIDEX ) 1 MG tablet TAKE 1 TABLET BY MOUTH EVERY DAY 11/15/23  Yes Gudena, Vinay, MD  atorvastatin  (LIPITOR ) 80 MG tablet Take 1 tablet (80 mg total) by mouth in the morning. 06/05/23  Yes Chandrasekhar, Mahesh A, MD  budesonide  (RHINOCORT  AQUA) 32 MCG/ACT nasal spray Place 2 sprays into both nostrils in the morning. 12/12/22  Yes [provider]  Cholecalciferol 50 MCG (2000 UT) CAPS Take 2,000 Units by mouth in the morning. 12/27/22  Yes [provider]  diclofenac  Sodium (VOLTAREN ) 1 % GEL Apply 1 Application topically 4 (four) times daily as needed (pain.).   Yes [provider]  docusate sodium  (COLACE) 100 MG capsule Take 100 mg by mouth daily as needed for mild constipation.   Yes [provider]  doxycycline  (VIBRA -TABS) 100 MG tablet Take 100 mg by mouth 2 (two) times daily. 12/11/23  Yes [provider]  eszopiclone (LUNESTA) 2 MG TABS tablet Take 2 mg by mouth at bedtime. 03/27/23  Yes [provider]  febuxostat  (ULORIC ) 40  MG tablet Take 40 mg by mouth in the morning.   Yes [provider]  furosemide  (LASIX ) 80 MG tablet Take 80 mg by mouth See admin instructions. Take 1 tablet by mouth on non-dialysis days, Mon, Wed, Fri, Sun 09/01/23  Yes [provider]  loratadine  (CLARITIN ) 10 MG tablet Take 10 mg by mouth 2 (two) times daily. 12/12/22  Yes [provider]  Methoxy PEG-Epoetin  Beta (MIRCERA IJ) 60 mcg. 08/07/23 08/05/24 Yes [provider]  metoprolol  succinate (TOPROL -XL) 100 MG 24 hr tablet Take 0.5-1 tablet (50-100 mg total) by mouth daily as needed for elevated heart rate. Take with or immediately following a meal. Patient taking differently: Take 50 mg by mouth every evening. 06/05/23  Yes Chandrasekhar, Mahesh A, MD  Multiple Vitamins-Minerals (PRESERVISION AREDS 2+MULTI VIT PO)  Take 1 capsule by mouth 2 (two) times daily.   Yes [provider]  ondansetron  (ZOFRAN ) 4 MG tablet Take 4 mg by mouth See admin instructions. Take 1 tablet by mouth on dialysis days, during dialysis 11/13/23  Yes [provider]  pantoprazole  (PROTONIX ) 40 MG tablet Take 1 tablet (40 mg total) by mouth daily. 06/05/23  Yes Chandrasekhar, Mahesh A, MD  PROAIR  HFA 108 (90 BASE) MCG/ACT inhaler Inhale 1-2 puffs into the lungs every 6 (six) hours as needed for shortness of breath or wheezing (for respiratory issues.). 12/03/14  Yes [provider]  rOPINIRole  (REQUIP ) 0.5 MG tablet Take 1 tablet (0.5 mg total) by mouth every 12 (twelve) hours as needed (Restless legs). Patient taking differently: Take 0.5 mg by mouth in the morning and at bedtime. 04/25/23  Yes Jillian Buttery, MD  sevelamer  carbonate (RENVELA ) 800 MG tablet Take 1,600 mg by mouth 3 (three) times daily with meals. 03/15/23  Yes [provider]    Physical Exam: Vitals:   12/13/23 2212 12/13/23 2215 12/13/23 2224 12/14/23 0104  BP: (!) 158/64 (!) 158/64 (!) 145/69 (!) 131/54  Pulse: 85 88 89 85  Resp: 18  20 18 18   Temp: 98.3 F (36.8 C)  99 F (37.2 C) 98.2 F (36.8 C)  TempSrc:   Oral Oral  SpO2: 97% 98% 100% 99%   Physical Exam:  General: No acute distress, well developed, well nourished HEENT: Normocephalic, atraumatic, PERRL Cardiovascular: Normal rate and rhythm. Distal pulses intact. Pulmonary: Normal pulmonary effort, normal breath sounds Gastrointestinal: abdomen is bound. Patient reports 8/10 pain. Further examination deferred Musculoskeletal:Normal ROM, no lower ext edema Lymphadenopathy: No cervical LAD. Skin: Skin is warm and dry. Neuro: No focal deficits noted, AAOx3. PSYCH: Attentive and cooperative  Data Reviewed:  Results for orders placed or performed during the hospital encounter of 12/13/23 (from the past 24 hours)  Comprehensive metabolic panel     Status: Abnormal   Collection Time: 12/13/23  1:58 PM  Result Value Ref Range   Sodium 137 135 - 145 mmol/L   Potassium 3.4 (L) 3.5 - 5.1 mmol/L   Chloride 96 (L) 98 - 111 mmol/L   CO2 24 22 - 32 mmol/L   Glucose, Bld 105 (H) 70 - 99 mg/dL   BUN 57 (H) 8 - 23 mg/dL   Creatinine, Ser 2.84 (H) 0.44 - 1.00 mg/dL   Calcium  9.6 8.9 - 10.3 mg/dL   Total Protein 5.9 (L) 6.5 - 8.1 g/dL   Albumin  3.2 (L) 3.5 - 5.0 g/dL   AST 13 (L) 15 - 41 U/L   ALT 11 0 - 44 U/L   Alkaline Phosphatase 132 (H) 38 - 126 U/L   Total Bilirubin 0.3 0.0 - 1.2 mg/dL   GFR, Estimated 5 (L) >60 mL/min   Anion gap 17 (H) 5 - 15  CBC with Differential     Status: Abnormal   Collection Time: 12/13/23  1:58 PM  Result Value Ref Range   WBC 17.2 (H) 4.0 - 10.5 K/uL   RBC 2.79 (L) 3.87 - 5.11 MIL/uL   Hemoglobin 8.8 (L) 12.0 - 15.0 g/dL   HCT 73.7 (L) 63.9 - 53.9 %   MCV 93.9 80.0 - 100.0 fL   MCH 31.5 26.0 - 34.0 pg   MCHC 33.6 30.0 - 36.0 g/dL   RDW 84.2 (H) 88.4 - 84.4 %   Platelets 330 150 - 400 K/uL   nRBC 0.0 0.0 - 0.2 %  Neutrophils Relative % 86 %   Neutro Abs 14.8 (H) 1.7 - 7.7 K/uL   Lymphocytes Relative 5 %   Lymphs Abs  0.8 0.7 - 4.0 K/uL   Monocytes Relative 7 %   Monocytes Absolute 1.3 (H) 0.1 - 1.0 K/uL   Eosinophils Relative 0 %   Eosinophils Absolute 0.1 0.0 - 0.5 K/uL   Basophils Relative 0 %   Basophils Absolute 0.0 0.0 - 0.1 K/uL   Immature Granulocytes 2 %   Abs Immature Granulocytes 0.29 (H) 0.00 - 0.07 K/uL  Lactic acid, plasma     Status: None   Collection Time: 12/13/23  1:58 PM  Result Value Ref Range   Lactic Acid, Venous 0.9 0.5 - 1.9 mmol/L  Surgical pcr screen     Status: None   Collection Time: 12/13/23  5:35 PM   Specimen: Nasal Mucosa; Nasal Swab  Result Value Ref Range   MRSA, PCR NEGATIVE NEGATIVE   Staphylococcus aureus NEGATIVE NEGATIVE  Type and screen [facility]     Status: None   Collection Time: 12/13/23  6:20 PM  Result Value Ref Range   ABO/RH(D) A POS    Antibody Screen NEG    Sample Expiration      12/16/2023,2359 Performed at [facility], [address], [place], [place] [address]      Assessment and Plan: Perforated bowel due to incarcerated hernia - s/p colostomy - patient is alert and appropriate post op.  She continues to have 8/10 pain after .25mg  of Dilaudid .. - Will increase her dose to 0.5 mg IV q 4 hours prn - Zosyn  ordered - Management per general surgery  2.  End-stage renal disease -the patient missed hemodialysis today because of the situation with her abdomen.  Nephrology has been consulted and she will likely get her hemodialysis tomorrow.  3. P Afib -she was not taking Eliquis  due to recent  Subarachnoid hemorrhage. - May switch Toprol  XL to shorter acting beta blocker until she is out of the woods for sepsis.  4.  Subarachnoid hemorrhage due to fall while on Eliquis  - discharged just 5 days ago  5.  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     Advance Care Planning:   Code Status: Full Code the patient says her healthcare power of attorney is actually her cousin Alm and his wife. But she lists her very  good friends Bruna who is a retired Engineer, civil (consulting) and Verneita who is a retired Teacher, early years/pre as her first contacts. She wants to be full code.  Consults: General Surgery, nephrology  Family Communication: none  Severity of Illness: The appropriate patient status for this patient is INPATIENT. Inpatient status is judged to be reasonable and necessary in order to provide the required intensity of service to ensure the patient's safety. The patient's presenting symptoms, physical exam findings, and initial radiographic and laboratory data in the context of their chronic comorbidities is felt to place them at high risk for further clinical deterioration. Furthermore, it is not anticipated that the patient will be medically stable for discharge from the hospital within 2 midnights of admission.   * I certify that at the point of admission it is my clinical judgment that the patient will require inpatient hospital care spanning beyond 2 midnights from the point of admission due to high intensity of service, high risk for further deterioration and high frequency of surveillance required.*  Author: ARTHEA CHILD, MD 12/14/2023 3:40 AM  For  on call review [email or web address].

## 2023-12-14 NOTE — Progress Notes (Signed)
 1 Day Post-Op  Subjective: Doing well today. Pain seems overall fairly well controlled.  Minimal nausea at times.  Drinking some liquids.  Not voiding, but doesn't void often due to HD.    ROS: See above, otherwise other systems negative  Objective: Vital signs in last 24 hours: Temp:  [97.5 F (36.4 C)-99 F (37.2 C)] 97.5 F (36.4 C) (09/05 0812) Pulse Rate:  [80-96] 82 (09/05 0825) Resp:  [16-25] 17 (09/05 0812) BP: (104-174)/(54-160) 104/88 (09/05 0825) SpO2:  [91 %-100 %] 98 % (09/05 0812) Last BM Date : 12/13/23  Intake/Output from previous day: 09/04 0701 - 09/05 0700 In: 850 [I.V.:200; IV Piggyback:650] Out: 50 [Blood:50] Intake/Output this shift: No intake/output data recorded.  PE: Gen: NAD, laying in bed Abd: soft, appropriately tender, midline wound is clean and packed.  Colostomy with viable stoma, no output yet.  Abdominal binder in place  Lab Results:  Recent Labs    12/13/23 1358 12/14/23 0652  WBC 17.2* 17.4*  HGB 8.8* 8.9*  HCT 26.2* 26.6*  PLT 330 347   BMET Recent Labs    12/13/23 1358 12/14/23 0652  NA 137 139  K 3.4* 4.3  CL 96* 99  CO2 24 23  GLUCOSE 105* 145*  BUN 57* 67*  CREATININE 7.15* 7.91*  CALCIUM  9.6 9.1   PT/INR No results for input(s): LABPROT, INR in the last 72 hours. CMP     Component Value Date/Time   NA 139 12/14/2023 0652   NA 141 05/10/2023 1314   K 4.3 12/14/2023 0652   CL 99 12/14/2023 0652   CO2 23 12/14/2023 0652   GLUCOSE 145 (H) 12/14/2023 0652   BUN 67 (H) 12/14/2023 0652   BUN 40 (H) 05/10/2023 1314   CREATININE 7.91 (H) 12/14/2023 0652   CREATININE 5.78 (H) 11/19/2023 1140   CALCIUM  9.1 12/14/2023 0652   PROT 5.9 (L) 12/13/2023 1358   PROT 5.5 (L) 05/10/2023 1314   ALBUMIN  3.2 (L) 12/13/2023 1358   ALBUMIN  3.4 (L) 05/10/2023 1314   AST 13 (L) 12/13/2023 1358   AST 14 (L) 11/19/2023 1140   ALT 11 12/13/2023 1358   ALT 11 11/19/2023 1140   ALKPHOS 132 (H) 12/13/2023 1358   BILITOT  0.3 12/13/2023 1358   BILITOT 0.4 11/19/2023 1140   GFRNONAA 5 (L) 12/14/2023 0652   GFRNONAA 7 (L) 11/19/2023 1140   Lipase  No results found for: LIPASE     Studies/Results: CT ABDOMEN PELVIS WO CONTRAST Addendum Date: 12/13/2023 ADDENDUM REPORT: 12/13/2023 14:05 ADDENDUM: The original report was by Dr. Ryan Salvage. The following addendum is by Dr. Ryan Salvage: Critical Value/emergent results were called by telephone at the time of interpretation on 12/13/2023 at 2:05 pm to provider [facility] , who verbally acknowledged these results. Electronically Signed   By: Ryan Salvage M.D.   On: 12/13/2023 14:05   Result Date: 12/13/2023 CLINICAL DATA:  Invasive breast cancer. Periumbilical hernia with discoloration and drainage. Abdominal pain. End-stage renal disease on hemodialysis. * Tracking Code: BO * EXAM: CT ABDOMEN AND PELVIS WITHOUT CONTRAST TECHNIQUE: Multidetector CT imaging of the abdomen and pelvis was performed following the standard protocol without IV contrast. RADIATION DOSE REDUCTION: This exam was performed according to the departmental dose-optimization program which includes automated exposure control, adjustment of the mA and/or kV according to patient size and/or use of iterative reconstruction technique. COMPARISON:  11/19/2023 FINDINGS: Lower chest: Mild cardiomegaly. Coronary and aortic atherosclerosis. Hepatobiliary: Dependent density in the gallbladder favoring  small gallstones. Otherwise unremarkable. Pancreas: Unremarkable Spleen: Unremarkable Adrenals/Urinary Tract: Stable appearance of multiple renal cysts of varying complexity. Atrophic right kidney. Stable mild nodularity of the left adrenal gland, internal density 28 Hounsfield units which is nonspecific. Stomach/Bowel: The left paracentral umbilical hernia contains a knuckle of transverse colon or a Richter hernia with surrounding abnormal extraluminal gas in the hernia favoring perforated viscus. The  extraluminal gas is all contained to the hernia sac which measures about 5.6 by 4.4 cm on image 47 series 2. No current bowel dilatation although there is prominence of stool throughout the colon. Sigmoid colon diverticulosis. Postoperative findings in the right colon. Vascular/Lymphatic: Atherosclerosis is present, including aortoiliac atherosclerotic disease. Atheromatous plaque at the origin of the celiac trunk and SMA. Enlarged precaval node 1.2 cm in short axis on image 35 series 2, formerly 1.3 cm. Reproductive: Uterus absent. Other: No supplemental non-categorized findings. Musculoskeletal: In addition to the left paraumbilical hernia, there is a left Spigelian or lateral abdominal wall hernia the level of the umbilicus containing adipose tissue shown on image 88 series 5. There is also laxity and potentially mild focal hernia of the right lateral abdominal wall musculature containing part of the terminal ileum on image 50 series 2. Mild grade 1 degenerative anterolisthesis at L4-5. IMPRESSION: 1. The left paracentral umbilical hernia contains a knuckle of transverse colon or a Richter hernia with surrounding abnormal extraluminal gas in the hernia sac indicating perforated viscus. The extraluminal gas is all contained to the hernia sac which measures about 5.6 by 4.4 cm. 2. There is also a left Spigelian or lateral abdominal wall hernia the level of the umbilicus containing adipose tissue. There is also laxity and potentially mild focal hernia of the right lateral abdominal wall musculature containing part of the terminal ileum. 3. Stable appearance of multiple renal cysts of varying complexity. Atrophic right kidney. 4. Stable mild nodularity of the left adrenal gland, internal density 28 Hounsfield units which is nonspecific. 5. Cholelithiasis. 6. Sigmoid colon diverticulosis. 7. Mild grade 1 degenerative anterolisthesis at L4-5. 8.  Aortic Atherosclerosis (ICD10-I70.0). Radiology assistant personnel have  been notified to put me in telephone contact with the referring physician or the referring physician's clinical representative in order to discuss these findings. Once this communication is established I will issue an addendum to this report for documentation purposes. Electronically Signed: By: Ryan Salvage M.D. On: 12/13/2023 13:59    Anti-infectives: Anti-infectives (From admission, onward)    Start     Dose/Rate Route Frequency Ordered Stop   12/13/23 2200  piperacillin -tazobactam (ZOSYN ) IVPB 2.25 g        2.25 g 100 mL/hr over 30 Minutes Intravenous Every 8 hours 12/13/23 2113     12/13/23 2111  piperacillin -tazobactam (ZOSYN ) 3.375 GM/50ML IVPB       Note to Pharmacy: Golob, Jamie C: cabinet override      12/13/23 2111 12/14/23 0914   12/13/23 1415  piperacillin -tazobactam (ZOSYN ) IVPB 3.375 g        3.375 g 100 mL/hr over 30 Minutes Intravenous  Once 12/13/23 1413 12/13/23 1653        Assessment/Plan POD 1, s/p ex lap with segmental TC resection with end colostomy, abdominal wall debridement, and ventral incisional hernia repair with vicryl mesh, Dr. Dasie 12/13/2023 -PT to mobilize -IS for pulm toilet -cont zosyn  x 5 days -CLD, hold on advancement until she has better bowel function -WOC consult for colostomy (she has had one before, but 25 yrs ago) -multi-modal pain control -  may be a candidate eventually for a wound VAC, but want to hold off on for several days to monitor her fascia as there is a concern for possible separation from poor tissue integrity.  FEN - CLD, IVFs per primary VTE - heparin  SQ ID - Zosyn   MMP - per TRH   LOS: 1 day    Burnard FORBES Banter , [facility] 12/14/2023, 11:09 AM Please see Amion for pager number during day hours 7:00am-4:30pm or 7:00am -11:30am on weekends

## 2023-12-14 NOTE — Progress Notes (Signed)
 Mobility Specialist Progress Note:    12/14/23 1506  Mobility  Activity Pivoted/transferred from bed to chair  Level of Assistance Contact guard assist, steadying assist  Assistive Device Front wheel walker  Distance Ambulated (ft) 8 ft  Activity Response Tolerated well  Mobility Referral Yes  Mobility visit 1 Mobility  Mobility Specialist Start Time (ACUTE ONLY) 1412  Mobility Specialist Stop Time (ACUTE ONLY) 1438  Mobility Specialist Time Calculation (min) (ACUTE ONLY) 26 min   Received pt in bed and agreeable to mobility. No physical assistance needed. Pt ambulated to chair. No c/o. Left pt in chair. Personal belongings and call light within reach. All needs met.   Lavanda Pollack Mobility Specialist  Please contact via [employer] or  Rehab Office [phone]

## 2023-12-14 NOTE — Progress Notes (Signed)
 Patient has not urinated since surgery, bladder scan performed twice and got 0 ml result for both, patient states that she is oliguric urinating once or twice a day. Rachel Horns NP updated.

## 2023-12-14 NOTE — Consult Note (Signed)
 Renal Service Consult Note [facility] [name], MD  Patient: Rachel Villarreal Date: 12/14/2023 Requesting Physician: Dr. Tobie, P.   Reason for Consult: ESRD pt w/ incarcerated hernia HPI: The patient is a 78 y.o. year-old w/ PMH as below who presented to ED for pus draining from her umbilicus. In the ED BP was 129/54, HR 90, RR 15, temp 98.  96% on room air.  K+ 3.4, creatinine 7.1, WBC 17K, Hgb 9.1.  IV antibiotics were given.  CT abdomen showed a paracentral umbilical current hernia with perforated viscus.  Surgery was called and took patient to the OR last night.  They did segmental transverse colon resection with creation of colostomy and incisional hernia repair with Vicryl mesh.  Patient was admitted to the medical service.  We are asked to see for dialysis.   Pt seen in hospital room.  Patient takes dialysis TTS via a right arm AV fistula.  On dialysis about 1 year.  Lives alone, drives herself to dialysis.   ROS - denies CP, no joint pain, no HA, no blurry vision, no rash, no diarrhea, no nausea/ vomiting   Past Medical History  Past Medical History:  Diagnosis Date   Abnormal Pap smear of vagina    Anemia    Asthma    Mild   Cancer (HCC)    left breast ILC   Chronic kidney disease (CKD)    stage 4   GERD (gastroesophageal reflux disease)    Gout 05/2012   Hernia, incisional    History of abnormal Pap smear    CIN III, 2001   History of blood transfusion    POST OP   History of uterine fibroid    Hypertension    Peritonitis (HCC)    Respiratory failure (HCC)    after ruptured appendix   Ruptured appendix    Seasonal allergies    Shingles 02/2013   very mild case   Urine incontinence    Past Surgical History  Past Surgical History:  Procedure Laterality Date   A/V FISTULAGRAM Right 03/19/2023   Procedure: A/V Fistulagram;  Surgeon: Sheree Penne Bruckner, MD;  Location: [facility] INVASIVE CV LAB;  Service: Cardiovascular;  Laterality: Right;    APPENDECTOMY     AV FISTULA PLACEMENT Right 04/24/2022   Procedure: RIGHT BRACHIOCEPHALIC ARTERIOVENOUS (AV) FISTULA CREATION;  Surgeon: Eliza Bruckner RAMAN, MD;  Location: [facility] OR;  Service: Vascular;  Laterality: Right;   BREAST BIOPSY Right    fibrocystic changes   BREAST BIOPSY Left 08/02/2021   BREAST LUMPECTOMY WITH RADIOACTIVE SEED LOCALIZATION Left 09/12/2021   Procedure: LEFT BREAST LUMPECTOMY WITH RADIOACTIVE SEED LOCALIZATION;  Surgeon: Ebbie Cough, MD;  Location: [facility];  Service: General;  Laterality: Left;   CATARACT EXTRACTION Bilateral    COLONOSCOPY     COLONOSCOPY WITH PROPOFOL  N/A 02/18/2018   Procedure: COLONOSCOPY WITH PROPOFOL ;  Surgeon: Rosalie Kitchens, MD;  Location: [facility] ENDOSCOPY;  Service: Endoscopy;  Laterality: N/A;  Use ultraslim scope    COLOSTOMY  12/13/2023   Procedure: CREATION, COLOSTOMY;  Surgeon: Dasie Leonor CROME, MD;  Location: [facility] OR;  Service: General;;   FISTULA SUPERFICIALIZATION Right 04/13/2023   Procedure: FISTULA SUPERFICIALIZATION;  Surgeon: Sheree Penne Bruckner, MD;  Location: [facility] OR;  Service: Vascular;  Laterality: Right;   IR FLUORO GUIDE CV LINE RIGHT  03/10/2023   LAPAROTOMY N/A 12/13/2023   Procedure: LAPAROTOMY, EXPLORATORY; COLON RESECTION;  Surgeon: Dasie Leonor CROME, MD;  Location: [facility] OR;  Service:  General;  Laterality: N/A;   LEEP  2000   CIN 2/3   RE-EXCISION OF BREAST LUMPECTOMY Left 09/29/2021   Procedure: LEFT  BREAST RE-EXCISION LUMPECTOMY;  Surgeon: Ebbie Cough, MD;  Location: [facility] ORS;  Service: General;  Laterality: Left;   REFRACTIVE SURGERY     RIGHT/LEFT HEART CATH AND CORONARY ANGIOGRAPHY N/A 04/23/2023   Procedure: RIGHT/LEFT HEART CATH AND CORONARY ANGIOGRAPHY;  Surgeon: Swaz[place], Peter M, MD;  Location: [facility] INVASIVE CV LAB;  Service: Cardiovascular;  Laterality: N/A;   SUPRACERVICAL ABDOMINAL HYSTERECTOMY  2001   Family History  Family History  Problem Relation Age of Onset   Colon cancer Father     Breast cancer Neg Hx    Social History  reports that she quit smoking about 51 years ago. Her smoking use included cigarettes. She has never used smokeless tobacco. She reports that she does not drink alcohol and does not use drugs. Allergies  Allergies  Allergen Reactions   Ace Inhibitors     angioedema   Lactose Intolerance (Gi) Other (See Comments)    GI upset   Letrozole  Hives   Lisinopril Swelling   Mercury     Red eyes, through eye drops that contained thimerosal    Tape Dermatitis    plastic   Home medications Prior to Admission medications   Medication Sig Start Date End Date Taking? Authorizing Provider  acetaminophen  (TYLENOL ) 500 MG tablet Take 500 mg by mouth every 6 (six) hours as needed for mild pain (pain score 1-3).   Yes [provider]  anastrozole  (ARIMIDEX ) 1 MG tablet TAKE 1 TABLET BY MOUTH EVERY DAY 11/15/23  Yes Gudena, Vinay, MD  atorvastatin  (LIPITOR ) 80 MG tablet Take 1 tablet (80 mg total) by mouth in the morning. 06/05/23  Yes Chandrasekhar, Mahesh A, MD  budesonide  (RHINOCORT  AQUA) 32 MCG/ACT nasal spray Place 2 sprays into both nostrils in the morning. 12/12/22  Yes [provider]  Cholecalciferol 50 MCG (2000 UT) CAPS Take 2,000 Units by mouth in the morning. 12/27/22  Yes [provider]  diclofenac  Sodium (VOLTAREN ) 1 % GEL Apply 1 Application topically 4 (four) times daily as needed (pain.).   Yes [provider]  docusate sodium  (COLACE) 100 MG capsule Take 100 mg by mouth daily as needed for mild constipation.   Yes [provider]  doxycycline  (VIBRA -TABS) 100 MG tablet Take 100 mg by mouth 2 (two) times daily. 12/11/23  Yes [provider]  eszopiclone (LUNESTA) 2 MG TABS tablet Take 2 mg by mouth at bedtime. 03/27/23  Yes [provider]  febuxostat  (ULORIC ) 40 MG tablet Take 40 mg by mouth in the morning.   Yes [provider]  furosemide  (LASIX ) 80 MG tablet Take 80 mg by mouth  See admin instructions. Take 1 tablet by mouth on non-dialysis days, Mon, Wed, Fri, Sun 09/01/23  Yes [provider]  loratadine  (CLARITIN ) 10 MG tablet Take 10 mg by mouth 2 (two) times daily. 12/12/22  Yes [provider]  Methoxy PEG-Epoetin  Beta (MIRCERA IJ) 60 mcg. 08/07/23 08/05/24 Yes [provider]  metoprolol  succinate (TOPROL -XL) 100 MG 24 hr tablet Take 0.5-1 tablet (50-100 mg total) by mouth daily as needed for elevated heart rate. Take with or immediately following a meal. Patient taking differently: Take 50 mg by mouth every evening. 06/05/23  Yes Chandrasekhar, Mahesh A, MD  Multiple Vitamins-Minerals (PRESERVISION AREDS 2+MULTI VIT PO) Take 1 capsule by mouth 2 (two) times daily.   Yes [provider]  ondansetron  (ZOFRAN ) 4 MG tablet Take 4 mg by mouth See admin instructions. Take 1 tablet by mouth on dialysis days, during dialysis 11/13/23  Yes [provider]  pantoprazole  (PROTONIX ) 40 MG tablet Take 1 tablet (40 mg total) by mouth daily. 06/05/23  Yes Chandrasekhar, Mahesh A, MD  PROAIR  HFA 108 (90 BASE) MCG/ACT inhaler Inhale 1-2 puffs into the lungs every 6 (six) hours as needed for shortness of breath or wheezing (for respiratory issues.). 12/03/14  Yes [provider]  rOPINIRole  (REQUIP ) 0.5 MG tablet Take 1 tablet (0.5 mg total) by mouth every 12 (twelve) hours as needed (Restless legs). Patient taking differently: Take 0.5 mg by mouth in the morning and at bedtime. 04/25/23  Yes Jillian Buttery, MD  sevelamer  carbonate (RENVELA ) 800 MG tablet Take 1,600 mg by mouth 3 (three) times daily with meals. 03/15/23  Yes [provider]     Vitals:   12/14/23 0543 12/14/23 0544 12/14/23 0812 12/14/23 0825  BP: (!) 129/56  104/88 104/88  Pulse: 80  82 82  Resp: 18  17   Temp: 97.6 F (36.4 C)  (!) 97.5 F (36.4 C)   TempSrc: Oral  Oral   SpO2: 95% 95% 98%    Exam Gen alert, no distress, 3L [place] O2 Sclera anicteric,  throat clear  No jvd or bruits Chest clear bilat to bases RRR no MRG Abd soft ntnd no mass or ascites +bs Ext 1+ bilat LE edema, no other edema Neuro is alert, Ox 3 , nf    RUA AVF+ bruit   Home bp meds: Metoprolol  xl 50 hs Lasix  80 mg non hd days   OP HD: [facility] TTS 3h   B350   68.7kg   2K bath  AVF   Heparin  none Last hd 9/02 post wt 69.9kg  Mircera 30 mcg q 4, last 8/26  K+ 4.3, bun 67, creat 7.9   CXR - no active disease  Assessment/ Plan: Perforated bowel due to incarcerated hernia: IV zosyn , pain meds per pmd/ surgery ESRD: on HD TTS. Missed HD Thursday. Labs/ volume are okay. Plan next HD for tomorrow.  BP: bp's are soft in the low 100s- 110s, follow.  Atrial fib: getting metoprolol  25 bid for HR control I believe Volume: mild ^LE edema, getting bed weight Anemia of esrd: Hb 8-10 here. Follow, transfuse prn.        Myer Fret  MD [facility] 12/14/2023, 11:11 AM  Recent Labs  Lab 12/13/23 1358 12/14/23 0652  HGB 8.8* 8.9*  ALBUMIN  3.2*  --   CALCIUM  9.6 9.1  CREATININE 7.15* 7.91*  K 3.4* 4.3   Inpatient medications:  acetaminophen   1,000 mg Oral Q6H   atorvastatin   80 mg Oral q AM   febuxostat   40 mg Oral q AM   methocarbamol   500 mg Oral TID   metoprolol  tartrate  25 mg Oral BID   ondansetron        pantoprazole   40 mg Oral Daily   sevelamer  carbonate  1,600 mg Oral TID WC    sodium chloride  10 mL/hr at 12/13/23 1442   piperacillin -tazobactam (ZOSYN )  IV Stopped (12/14/23 0609)   sodium chloride , HYDROmorphone  (DILAUDID ) injection, ondansetron , ondansetron  (ZOFRAN ) IV, oxyCODONE , rOPINIRole , zolpidem 

## 2023-12-14 NOTE — Progress Notes (Signed)
 [facility] Progress Note Patient: Rachel Villarreal FMW:[id number] DOB: 12-12-1945  DOA: 12/13/2023 DOS: the patient was seen and examined on 12/14/2023  Brief Hospital Course: Patient with PMH of ESRD on TTS, PAF on Eliquis , HFpEF, CAD, HLD, anemia of CKD, breast cancer on left presents to the hospital with complaints of abdominal wound. General surgery was consulted.  Patient underwent exploratory laparotomy with colon resection and colostomy with mesh and ventral hernia.  Assessment and Plan: Ventral incisional hernia Skin necrosis Bowel perforation Status post exploratory laparotomy with colon resection and creation of colostomy as well as abdominal wall debridement with ventral incisional hernia repair. Management per surgery. Currently on clear liquid diet. On IV antibiotic IV Zosyn . Unfortunately no cultures were sent. Will monitor clinically.  ESRD on HD. Currently on HD TTS. Nephrology following.  History of SAH. Hospital aspirin  8/28 and 8/30. Had a fall with SAH secondary to Eliquis . Currently holding anticoagulation.  Paroxysmal A-fib. On Toprol -XL. Holding Eliquis .  CAD. Past history. No active complaints. Monitor.  Scalp laceration. Suture to placed on 8/28. Removal in 10 days.  Restless leg syndrome. Continue Requip .  GERD. Continue PPI.  History of breast cancer. On anastrozole . Currently on hold.   Subjective: No nausea no vomiting no fever no chills.  No chest pain.  Abdominal pain still present.  Passing gas.  No BM.  Physical Exam: Clear to auscultation. S1-S2 present Bowel sound present.  Diffusely tender. No edema.  Data Reviewed: I have Reviewed nursing notes, Vitals, and Lab results. Since last encounter, pertinent lab results CBC and BMP   . I have ordered test including CBC and BMP  .  Discussed with general surgery  Disposition: Status is: Inpatient Remains inpatient appropriate because: Monitor for improvement in oral  intake  heparin  injection 5,000 Units Start: 12/14/23 1400 SCDs Start: 12/14/23 0336   Family Communication: No one at bedside Level of care: Telemetry Surgical   Vitals:   12/14/23 0812 12/14/23 0825 12/14/23 1200 12/14/23 1707  BP: 104/88 104/88 (!) 117/50 (!) 116/54  Pulse: 82 82 74 76  Resp: 17  17 18   Temp: (!) 97.5 F (36.4 C)  97.9 F (36.6 C) 98 F (36.7 C)  TempSrc: Oral  Oral Oral  SpO2: 98%  100% 100%     Author: Yetta Blanch, MD 12/14/2023 6:24 PM  Please look on [email or web address] to find out who is on call.

## 2023-12-14 NOTE — TOC Initial Note (Signed)
 Transition of Care (TOC) - Initial/Assessment Note   Spoke to patient at bedside. Confirmed face sheet information. Prefers cell number over home phone number.   Patient from home alone. Family are not local, but has supportive friends.   Patient has been to [place] in past for rehab. Patient has had home health services with [facility] in past.   NCM will continue to follow for discharge needs.   Discussed prior to discharge patient will be provided with wound care and ostomy education. If discharged to home with [facility] ,Home health RN does not visit daily .   Patient voiced understanding.   Provided patient with medicare.gov list of home health agencies.   Patient has walker , shower chair and cane at home but prior to admission was independent and did not need to use DME   NCM will continue to follow.  Patient Details  Name: Rachel Villarreal MRN: [id number] Date of Birth: [birth year]  Transition of Care [facility]) CM/SW Contact:    Stephane Powell Jansky, RN Phone Number: 12/14/2023, 1:48 PM  Clinical Narrative:                   Expected Discharge Plan: Home w Home Health Services Barriers to Discharge: Continued Medical Work up   Patient Goals and CMS Choice Patient states their goals for this hospitalization and ongoing recovery are:: to return to home CMS Medicare.gov Compare Post Acute Care list provided to:: Patient Choice offered to / list presented to : Patient      Expected Discharge Plan and Services   Discharge Planning Services: CM Consult Post Acute Care Choice: Home Health Living arrangements for the past 2 months: Single Family Home                 DME Arranged: N/A         HH Arranged:  (see note)          Prior Living Arrangements/Services Living arrangements for the past 2 months: Single Family Home Lives with:: Self Patient language and need for interpreter reviewed:: Yes Do you feel safe going back to the place where you live?: Yes      Need for  Family Participation in Patient Care: Yes (Comment) Care giver support system in place?: Yes (comment) Current home services: DME Criminal Activity/Legal Involvement Pertinent to Current Situation/Hospitalization: No - Comment as needed  Activities of Daily Living   ADL Screening (condition at time of admission) Independently performs ADLs?: Yes (appropriate for developmental age) Is the patient deaf or have difficulty hearing?: No Does the patient have difficulty seeing, even when wearing glasses/contacts?: No Does the patient have difficulty concentrating, remembering, or making decisions?: No  Permission Sought/Granted   Permission granted to share information with : No              Emotional Assessment Appearance:: Appears stated age Attitude/Demeanor/Rapport: Engaged Affect (typically observed): Appropriate Orientation: : Oriented to Self, Oriented to Place, Oriented to  Time, Oriented to Situation Alcohol / Substance Use: Not Applicable Psych Involvement: No (comment)  Admission diagnosis:  Perforated abdominal viscus [R19.8] Incarcerated hernia [K46.0] Patient Active Problem List   Diagnosis Date Noted   Perforated bowel (HCC) 12/14/2023   Atrial flutter (HCC) 12/14/2023   SAH (subarachnoid hemorrhage) (HCC) 12/06/2023   Acute ischemic stroke (HCC) 12/06/2023   Scalp laceration, initial encounter 12/06/2023   Anemia of chronic renal failure 12/06/2023   Coronary artery disease 12/06/2023   Malnutrition of moderate degree 04/24/2023  Influenza A with pneumonia 04/17/2023   Paroxysmal atrial fibrillation (HCC) 04/17/2023   Non-ST elevated myocardial infarction (HCC) 04/16/2023   ESRD on dialysis (HCC) 03/09/2023   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   Insomnia 03/09/2023   Anemia in chronic kidney disease 12/12/2022   Coagulation defect, unspecified (HCC) 12/12/2022   History of hysterectomy, supracervical 03/01/2022   History of loop electrical excision procedure  (LEEP) 03/01/2022   Breast cancer (HCC) 09/12/2021   Malignant neoplasm of upper-outer quadrant of left breast in female, estrogen receptor positive (HCC) 08/29/2021   Allergic rhinitis 01/20/2021   GERD (gastroesophageal reflux disease) 01/20/2021   Osteopenia 01/20/2021   Pure hypercholesterolemia 01/20/2021   Vitamin D deficiency 01/20/2021   Gout 03/28/2016   Essential hypertension 03/28/2016   Hyperlipidemia 03/28/2016   Hormone replacement therapy (HRT) 11/18/2013   PCP:  Ransom Other, MD Pharmacy:   [id number] [address], [place] - [address] [place] [address] Phone: [phone] Fax: [phone]     Social Drivers of Health (SDOH) Social History: SDOH Screenings   Food Insecurity: No Food Insecurity (12/13/2023)  Housing: Low Risk  (12/13/2023)  Transportation Needs: No Transportation Needs (12/13/2023)  Utilities: Not At Risk (12/13/2023)  Depression (PHQ2-9): Low Risk  (02/28/2022)  Financial Resource Strain: Low Risk  (09/02/2021)  Social Connections: Moderately Integrated (12/13/2023)  Stress: No Stress Concern Present (09/02/2021)  Tobacco Use: Medium Risk (12/13/2023)   SDOH Interventions:     Readmission Risk Interventions     No data to display

## 2023-12-14 NOTE — Hospital Course (Addendum)
 Patient with PMH of ESRD on TTS, PAF on Eliquis , HFpEF, CAD, HLD, anemia of CKD, breast cancer on left presents to the hospital with complaints of abdominal wound. General surgery was consulted.  Patient underwent exploratory laparotomy with colon resection and colostomy with mesh and ventral hernia.  Assessment and Plan: Ventral incisional hernia with skin necrosis secondary to bowel perforation Status post exploratory laparotomy with colon resection and creation of colostomy as well as abdominal wall debridement with ventral incisional hernia repair. Management per surgery. Diet being advanced.  WOC nurse consulted for colostomy. On IV antibiotic IV Zosyn . Unfortunately no cultures were sent. Will monitor clinically.  ESRD on HD. Currently on HD TTS. Nephrology following.  History of SAH. Hospitalized on 8/28 and 8/30. Had a fall with SAH secondary to Eliquis . Currently holding anticoagulation.  Paroxysmal A-fib. On Toprol -XL.  50 mg daily.  Currently on Lopressor  25 mg twice daily. Holding Eliquis .  Will discuss with surgery with regards to resumption.  History of CAD. No active complaints.  Not on any antiplatelet medication. Monitor.  Scalp laceration. Suture to placed on 8/28. Removal in 10 days.  On 9/7.  Restless leg syndrome. Continue Requip .  Twice daily  GERD. Continue Protonix .  History of breast cancer. On anastrozole . Currently on hold.  Chronic insomnia. Continue Ambien  nightly.

## 2023-12-14 NOTE — Progress Notes (Signed)
 Pt receives out-pt HD at [facility] T[facility], chair time 1030 am. Will continue to assist as needed.   [facility] Nav [phone]

## 2023-12-15 DIAGNOSIS — K631 Perforation of intestine (nontraumatic): Secondary | ICD-10-CM | POA: Diagnosis not present

## 2023-12-15 LAB — CBC
HCT: 25.7 % — ABNORMAL LOW (ref 36.0–46.0)
Hemoglobin: 8.5 g/dL — ABNORMAL LOW (ref 12.0–15.0)
MCH: 31.8 pg (ref 26.0–34.0)
MCHC: 33.1 g/dL (ref 30.0–36.0)
MCV: 96.3 fL (ref 80.0–100.0)
Platelets: 446 K/uL — ABNORMAL HIGH (ref 150–400)
RBC: 2.67 MIL/uL — ABNORMAL LOW (ref 3.87–5.11)
RDW: 15.6 % — ABNORMAL HIGH (ref 11.5–15.5)
WBC: 19.6 K/uL — ABNORMAL HIGH (ref 4.0–10.5)
nRBC: 0 % (ref 0.0–0.2)

## 2023-12-15 LAB — RENAL FUNCTION PANEL
Albumin: 2.3 g/dL — ABNORMAL LOW (ref 3.5–5.0)
Anion gap: 19 — ABNORMAL HIGH (ref 5–15)
BUN: 87 mg/dL — ABNORMAL HIGH (ref 8–23)
CO2: 19 mmol/L — ABNORMAL LOW (ref 22–32)
Calcium: 8.5 mg/dL — ABNORMAL LOW (ref 8.9–10.3)
Chloride: 95 mmol/L — ABNORMAL LOW (ref 98–111)
Creatinine, Ser: 8.76 mg/dL — ABNORMAL HIGH (ref 0.44–1.00)
GFR, Estimated: 4 mL/min — ABNORMAL LOW (ref >60)
Glucose, Bld: 103 mg/dL — ABNORMAL HIGH (ref 70–99)
Phosphorus: 7.6 mg/dL — ABNORMAL HIGH (ref 2.5–4.6)
Potassium: 4.5 mmol/L (ref 3.5–5.1)
Sodium: 133 mmol/L — ABNORMAL LOW (ref 135–145)

## 2023-12-15 LAB — HEPATITIS B SURFACE ANTIBODY, QUANTITATIVE: Hep B S AB Quant (Post): 15.5 m[IU]/mL

## 2023-12-15 MED ORDER — NEPRO/CARBSTEADY PO LIQD
237.0000 mL | Freq: Two times a day (BID) | ORAL | Status: DC
  Administered 2023-12-15 – 2023-12-26 (×11): 237 mL via ORAL
  Filled 2023-12-15 (×2): qty 237

## 2023-12-15 MED ORDER — PENTAFLUOROPROP-TETRAFLUOROETH EX AERO: 1.0000 | INHALATION_SPRAY | CUTANEOUS | Status: DC | PRN

## 2023-12-15 MED ORDER — ONDANSETRON HCL 4 MG/2ML IJ SOLN
INTRAMUSCULAR | Status: AC
  Filled 2023-12-15: qty 2

## 2023-12-15 MED ORDER — HYDROMORPHONE HCL 1 MG/ML IJ SOLN: 0.5000 mg | INTRAMUSCULAR | Status: DC | PRN

## 2023-12-15 NOTE — Evaluation (Signed)
 Physical Therapy Evaluation Patient Details Name: Rachel Villarreal MRN: [id number] DOB: 1945-05-12 Today's Date: 12/15/2023  History of Present Illness  78 y.o. female admitted on 12/13/23 s/p ex lap with segmental TC resection with end colostomy, abdominal wall debridement, and ventral incisional hernia repair with vicryl mesh, Dr. Dasie 12/13/2023 for transverse colon perforation within an incarcerated ventral hernia  PMH: HTN, ESRD on HD, breast cancer, CAD, HFimpEF, PAF on eliquis .  Clinical Impression  PTA, patient lived alone in one level home with 3 steps to enter. Independent with ambulation/ADLs. Patient presents today with deficits not far from baseline including impairments in functional mobility and generalized weakness. Patient reported minimal abdominal pain with some pain always at baseline. She mostly reported issues with nausea today. Patient completes bed mobility with minA, sit to stand transfers with CGA, and short distance ambulation with 2WW and CGA. Patient remained seated upright in chair end of session. Patient will continue to benefit from skilled acute PT services. Recommend patient return home with HHPT and PRN assistance.         If plan is discharge home, recommend the following: Assist for transportation;A little help with walking and/or transfers;A little help with bathing/dressing/bathroom;Help with stairs or ramp for entrance;Assistance with cooking/housework   Can travel by private vehicle        Equipment Recommendations None recommended by PT  Recommendations for Other Services       Functional Status Assessment Patient has had a recent decline in their functional status and demonstrates the ability to make significant improvements in function in a reasonable and predictable amount of time.     Precautions / Restrictions Precautions Precautions: Fall Recall of Precautions/Restrictions: Intact Precaution/Restrictions Comments: abdominal  precautions Restrictions Weight Bearing Restrictions Per Provider Order: No      Mobility  Bed Mobility Overal bed mobility: Needs Assistance Bed Mobility: Supine to Sit, Rolling Rolling: Contact guard assist   Supine to sit: Min assist     General bed mobility comments: minA for trunk righting via log roll sequencing    Transfers Overall transfer level: Needs assistance Equipment used: Rolling walker (2 wheels) Transfers: Sit to/from Stand Sit to Stand: Contact guard assist           General transfer comment: CGA with cues for hand placement    Ambulation/Gait Ambulation/Gait assistance: Contact guard assist Gait Distance (Feet): 10 Feet Assistive device: Rolling walker (2 wheels) Gait Pattern/deviations: Step-through pattern, Decreased stride length          Stairs            Wheelchair Mobility     Tilt Bed    Modified Rankin (Stroke Patients Only)       Balance Overall balance assessment: Mild deficits observed, not formally tested                                           Pertinent Vitals/Pain Pain Assessment Pain Assessment: No/denies pain    Home Living Family/patient expects to be discharged to:: Private residence Living Arrangements: Alone Available Help at Discharge: Friend(s);Available PRN/intermittently Type of Home: House Home Access: Stairs to enter Entrance Stairs-Rails: Left Entrance Stairs-Number of Steps: 3 STE Alternate Level Stairs-Number of Steps: a step to the den and a step to the kitchen. Home Layout: One level Home Equipment: Shower seat - built Charity fundraiser (2 wheels);Cane - single point  Prior Function Prior Level of Function : Independent/Modified Independent;Driving             Mobility Comments: Ind with no AD ADLs Comments: Ind with ADL's and IADL's.     Extremity/Trunk Assessment   Upper Extremity Assessment Upper Extremity Assessment: Defer to OT evaluation     Lower Extremity Assessment Lower Extremity Assessment: Generalized weakness    Cervical / Trunk Assessment Cervical / Trunk Assessment: Normal  Communication   Communication Communication: No apparent difficulties    Cognition Arousal: Alert Behavior During Therapy: WFL for tasks assessed/performed   PT - Cognitive impairments: No apparent impairments                         Following commands: Intact       Cueing Cueing Techniques: Verbal cues, Tactile cues     General Comments General comments (skin integrity, edema, etc.): Nasal cannula at 1L/min doffed for therapy, sats > 98% spO2. RN notified of leaving Aiken off.    Exercises     Assessment/Plan    PT Assessment Patient needs continued PT services  PT Problem List Decreased strength;Decreased activity tolerance;Decreased balance;Decreased mobility;Decreased knowledge of precautions       PT Treatment Interventions DME instruction;Neuromuscular re-education;Gait training;Stair training;Functional mobility training;Therapeutic activities;Therapeutic exercise;Balance training;Patient/family education    PT Goals (Current goals can be found in the Care Plan section)  Acute Rehab PT Goals Patient Stated Goal: Patient's goal is to return home. PT Goal Formulation: With patient Time For Goal Achievement: 12/29/23 Potential to Achieve Goals: Good    Frequency Min 2X/week     Co-evaluation               AM-PAC PT 6 Clicks Mobility  Outcome Measure Help needed turning from your back to your side while in a flat bed without using bedrails?: A Little Help needed moving from lying on your back to sitting on the side of a flat bed without using bedrails?: A Little Help needed moving to and from a bed to a chair (including a wheelchair)?: A Little Help needed standing up from a chair using your arms (e.g., wheelchair or bedside chair)?: A Little Help needed to walk in hospital room?: A Little Help  needed climbing 3-5 steps with a railing? : A Lot 6 Click Score: 17    End of Session Equipment Utilized During Treatment: Gait belt Activity Tolerance: Patient tolerated treatment well Patient left: in chair;with family/visitor present;with call bell/phone within reach Nurse Communication: Mobility status PT Visit Diagnosis: Other abnormalities of gait and mobility (R26.89);Unsteadiness on feet (R26.81)    Time: 8380-8361 PT Time Calculation (min) (ACUTE ONLY): 19 min   Charges:   PT Evaluation $PT Eval Low Complexity: 1 Low   PT General Charges $$ ACUTE PT VISIT: 1 Visit        Sherryle S[place], PT, DPT [facility] Acute Rehabilitation Office: [phone]   Sherryle VEAR [place] 12/15/2023, 4:48 PM

## 2023-12-15 NOTE — Progress Notes (Signed)
 Pt returned back from HD in the bed.

## 2023-12-15 NOTE — Progress Notes (Signed)
 Changed dressing midabd with saline wet to dry, wound bed dry and pink, covered with gauze and ABD.  Emptied colostomy bag for 100 liquid light brown stool. Began pt education. (Pt had a colostomy about 25 years ago post ruptured appe) but said she doesn't remember that much from that long ago, will continue teaching her.

## 2023-12-15 NOTE — Progress Notes (Signed)
 [facility] Progress Note Patient: Rachel Villarreal FMW:[id number] DOB: [birth year]  DOA: 12/13/2023 DOS: the patient was seen and examined on 12/15/2023  Brief Hospital Course: Patient with PMH of ESRD on TTS, PAF on Eliquis , HFpEF, CAD, HLD, anemia of CKD, breast cancer on left presents to the hospital with complaints of abdominal wound. General surgery was consulted.  Patient underwent exploratory laparotomy with colon resection and colostomy with mesh and ventral hernia.  Assessment and Plan: Ventral incisional hernia with skin necrosis secondary to bowel perforation Status post exploratory laparotomy with colon resection and creation of colostomy as well as abdominal wall debridement with ventral incisional hernia repair. Management per surgery. Currently on full liquid diet. On IV antibiotic IV Zosyn . Unfortunately no cultures were sent. Will monitor clinically.  ESRD on HD. Currently on HD TTS. Nephrology following.  History of SAH. Hospitalized on 8/28 and 8/30. Had a fall with SAH secondary to Eliquis . Currently holding anticoagulation.  Paroxysmal A-fib. On Toprol -XL.  50 mg daily.  Currently on Lopressor  25 mg twice daily. Holding Eliquis .  Will discuss with surgery with regards to resumption.  History of CAD. No active complaints.  Not on any antiplatelet medication. Monitor.  Scalp laceration. Suture to placed on 8/28. Removal in 10 days.  On 9/7.  Restless leg syndrome. Continue Requip .  Twice daily as needed.  GERD. Continue Protonix .  History of breast cancer. On anastrozole . Currently on hold.  Chronic insomnia. Continue Ambien  nightly.   Subjective: No nausea no vomiting no fever no chills at the time of my evaluation dialysis she feels that she is confused.  Pain reported well-controlled.  Later on requested to be coming off of the HD secondary to nausea.  Physical Exam: Clear to auscultation. S1-S2 present Bowel sound present.  Mild  tenderness diffusely. No edema  Data Reviewed: I have Reviewed nursing notes, Vitals, and Lab results. Since last encounter, pertinent lab results CBC and BMP   . I have ordered test including CBC and BMP  .   Disposition: Status is: Inpatient Remains inpatient appropriate because: Monitor for improvement in oral intake  heparin  injection 5,000 Units Start: 12/14/23 1400 SCDs Start: 12/14/23 0336   Family Communication: No one at bedside Level of care: Telemetry Surgical   Vitals:   12/15/23 1120 12/15/23 1305 12/15/23 1641 12/15/23 1706  BP: 114/74 (!) 130/54  (!) 105/45  Pulse: 86 89  84  Resp: (!) 21   17  Temp: 98.1 F (36.7 C)   98.7 F (37.1 C)  TempSrc:    Oral  SpO2: 96%  98% 95%  Weight: 74.4 kg        Author: Yetta Blanch, MD 12/15/2023 5:56 PM  Please look on [email or web address] to find out who is on call.

## 2023-12-15 NOTE — Plan of Care (Signed)
 Pt had some nausea with HD today, zofran  given with relief.

## 2023-12-15 NOTE — Progress Notes (Signed)
 [facility] Progress Note   Subjective:    Seen and examined patient on HD. So far tolerating UFG 2L. Noted CT showed paracentral umbilical hernia with perforated viscus. S/p segmental colon resection with creation of colostomy and incisional hernia repair with vicryl mesh on 9/4. Today, she reports feeling fine on treatment and denies any acute complaints.  Objective Vitals:   12/15/23 1042 12/15/23 1056 12/15/23 1110 12/15/23 1120  BP: (!) 98/54 122/60 (!) 110/42 114/74  Pulse: 87 86 83 86  Resp: (!) 24 (!) 22 18 (!) 21  Temp:    98.1 F (36.7 C)  TempSrc:      SpO2: 97% 97% 94% 96%  Weight:    74.4 kg   Physical Exam General: Awake, alert, on 1.5L O2, NAD Heart: S1 and S2; No wheezing, gallops, or rubs Lungs: Clear anteriorly Abdomen: Soft, non-tender Extremities: No LE edema Dialysis Access: R AVF   Filed Weights   12/15/23 0809 12/15/23 1120  Weight: 75.4 kg 74.4 kg    Intake/Output Summary (Last 24 hours) at 12/15/2023 1151 Last data filed at 12/15/2023 1120 Gross per 24 hour  Intake 597 ml  Output 1000 ml  Net -403 ml    Additional Objective Labs: Basic Metabolic Panel: Recent Labs  Lab 12/13/23 1358 12/14/23 0652 12/15/23 0834  NA 137 139 133*  K 3.4* 4.3 4.5  CL 96* 99 95*  CO2 24 23 19*  GLUCOSE 105* 145* 103*  BUN 57* 67* 87*  CREATININE 7.15* 7.91* 8.76*  CALCIUM  9.6 9.1 8.5*  PHOS  --   --  7.6*   Liver Function Tests: Recent Labs  Lab 12/13/23 1358 12/15/23 0834  AST 13*  --   ALT 11  --   ALKPHOS 132*  --   BILITOT 0.3  --   PROT 5.9*  --   ALBUMIN  3.2* 2.3*   No results for input(s): LIPASE, AMYLASE in the last 168 hours. CBC: Recent Labs  Lab 12/13/23 1358 12/14/23 0652 12/15/23 0830  WBC 17.2* 17.4* 19.6*  NEUTROABS 14.8*  --   --   HGB 8.8* 8.9* 8.5*  HCT 26.2* 26.6* 25.7*  MCV 93.9 94.3 96.3  PLT 330 347 446*   Blood Culture    Component Value Date/Time   SDES  12/13/2023 1411    BLOOD LEFT  ANTECUBITAL Performed at [facility], [address], [place], [place] [address]    [facility]  12/13/2023 1411    BOTTLES DRAWN AEROBIC AND ANAEROBIC Blood Culture adequate volume Performed at [facility], [address] [place], [place] [address]    CULT  12/13/2023 1411    NO GROWTH 2 DAYS Performed at [facility], [address], [place], [place] [address]    REPTSTATUS PENDING 12/13/2023 1411    Cardiac Enzymes: No results for input(s): CKTOTAL, CKMB, CKMBINDEX, TROPONINI in the last 168 hours. CBG: No results for input(s): GLUCAP in the last 168 hours. Iron Studies: No results for input(s): IRON, TIBC, TRANSFERRIN, FERRITIN in the last 72 hours. No results found for: INR, PROTIME Studies/Results: DG CHEST PORT 1 VIEW Result Date: 12/14/2023 CLINICAL DATA:  201257 ESRD (end stage renal disease) [facility]) [id number] Bilateral lower extremity edema [id number] EXAM: PORTABLE CHEST - 1 VIEW COMPARISON:  December 06, 2023 FINDINGS: Biapical pleural thickening. No focal airspace consolidation, pleural effusion, or pneumothorax. Mild cardiomegaly. Aortic atherosclerosis. No acute fracture or destructive lesions. Multilevel thoracic osteophytosis. IMPRESSION: No acute cardiopulmonary abnormality. Electronically Signed  By: Rogelia Myers M.D.   On: 12/14/2023 16:34   CT ABDOMEN PELVIS WO CONTRAST Addendum Date: 12/13/2023 ADDENDUM REPORT: 12/13/2023 14:05 ADDENDUM: The original report was by Dr. Ryan Salvage. The following addendum is by Dr. Ryan Salvage: Critical Value/emergent results were called by telephone at the time of interpretation on 12/13/2023 at 2:05 pm to provider [facility] , who verbally acknowledged these results. Electronically Signed   By: Ryan Salvage M.D.   On: 12/13/2023 14:05   Result Date: 12/13/2023 CLINICAL DATA:  Invasive breast cancer. Periumbilical hernia with discoloration and  drainage. Abdominal pain. End-stage renal disease on hemodialysis. * Tracking Code: BO * EXAM: CT ABDOMEN AND PELVIS WITHOUT CONTRAST TECHNIQUE: Multidetector CT imaging of the abdomen and pelvis was performed following the standard protocol without IV contrast. RADIATION DOSE REDUCTION: This exam was performed according to the departmental dose-optimization program which includes automated exposure control, adjustment of the mA and/or kV according to patient size and/or use of iterative reconstruction technique. COMPARISON:  11/19/2023 FINDINGS: Lower chest: Mild cardiomegaly. Coronary and aortic atherosclerosis. Hepatobiliary: Dependent density in the gallbladder favoring small gallstones. Otherwise unremarkable. Pancreas: Unremarkable Spleen: Unremarkable Adrenals/Urinary Tract: Stable appearance of multiple renal cysts of varying complexity. Atrophic right kidney. Stable mild nodularity of the left adrenal gland, internal density 28 Hounsfield units which is nonspecific. Stomach/Bowel: The left paracentral umbilical hernia contains a knuckle of transverse colon or a Richter hernia with surrounding abnormal extraluminal gas in the hernia favoring perforated viscus. The extraluminal gas is all contained to the hernia sac which measures about 5.6 by 4.4 cm on image 47 series 2. No current bowel dilatation although there is prominence of stool throughout the colon. Sigmoid colon diverticulosis. Postoperative findings in the right colon. Vascular/Lymphatic: Atherosclerosis is present, including aortoiliac atherosclerotic disease. Atheromatous plaque at the origin of the celiac trunk and SMA. Enlarged precaval node 1.2 cm in short axis on image 35 series 2, formerly 1.3 cm. Reproductive: Uterus absent. Other: No supplemental non-categorized findings. Musculoskeletal: In addition to the left paraumbilical hernia, there is a left Spigelian or lateral abdominal wall hernia the level of the umbilicus containing adipose  tissue shown on image 88 series 5. There is also laxity and potentially mild focal hernia of the right lateral abdominal wall musculature containing part of the terminal ileum on image 50 series 2. Mild grade 1 degenerative anterolisthesis at L4-5. IMPRESSION: 1. The left paracentral umbilical hernia contains a knuckle of transverse colon or a Richter hernia with surrounding abnormal extraluminal gas in the hernia sac indicating perforated viscus. The extraluminal gas is all contained to the hernia sac which measures about 5.6 by 4.4 cm. 2. There is also a left Spigelian or lateral abdominal wall hernia the level of the umbilicus containing adipose tissue. There is also laxity and potentially mild focal hernia of the right lateral abdominal wall musculature containing part of the terminal ileum. 3. Stable appearance of multiple renal cysts of varying complexity. Atrophic right kidney. 4. Stable mild nodularity of the left adrenal gland, internal density 28 Hounsfield units which is nonspecific. 5. Cholelithiasis. 6. Sigmoid colon diverticulosis. 7. Mild grade 1 degenerative anterolisthesis at L4-5. 8.  Aortic Atherosclerosis (ICD10-I70.0). Radiology assistant personnel have been notified to put me in telephone contact with the referring physician or the referring physician's clinical representative in order to discuss these findings. Once this communication is established I will issue an addendum to this report for documentation purposes. Electronically Signed: By: Ryan Salvage M.D. On: 12/13/2023 13:59  Medications:  piperacillin -tazobactam (ZOSYN )  IV 2.25 g (12/15/23 0549)    acetaminophen   1,000 mg Oral Q6H   atorvastatin   80 mg Oral q AM   Chlorhexidine  Gluconate Cloth  6 each Topical Q0600   febuxostat   40 mg Oral q AM   feeding supplement (NEPRO CARB STEADY)  237 mL Oral BID BM   heparin  injection (subcutaneous)  5,000 Units Subcutaneous Q8H   methocarbamol   500 mg Oral TID   metoprolol   tartrate  25 mg Oral BID   pantoprazole   40 mg Oral Daily   sevelamer  carbonate  1,600 mg Oral TID WC    Dialysis Orders: [facility] T[facility] 3h   B350   68.7kg   2K bath  AVF   Heparin  none Last hd 9/02 post wt 69.9kg  Mircera 30 mcg q 4, last 8/26  Home bp meds: Metoprolol  xl 50 hs Lasix  80 mg non hd days  Assessment/Plan: Perforated bowel due to incarcerated hernia: S/p segmental colon resection with creation of colostomy and incisional hernia repair with vicryl mesh on 9/4. Continue IV zosyn  and pain meds. Per pmd/ surgery ESRD: on HD TTS. On HD BP: bp's are soft in the low 100s- 110s, follow.  Atrial fib: getting metoprolol  25 bid  Volume: mild ^LE edema, getting bed weight Anemia of esrd: Hb 8-10 here. Follow, transfuse prn.   Charmaine Piety, NP [facility] 12/15/2023,11:51 AM  LOS: 2 days

## 2023-12-15 NOTE — Plan of Care (Signed)
  Problem: Clinical Measurements: Goal: Will remain free from infection Outcome: Not Progressing   Problem: Activity: Goal: Risk for activity intolerance will decrease Outcome: Not Progressing   Problem: Nutrition: Goal: Adequate nutrition will be maintained Outcome: Not Progressing   Problem: Elimination: Goal: Will not experience complications related to bowel motility Outcome: Not Progressing   Problem: Pain Managment: Goal: General experience of comfort will improve and/or be controlled Outcome: Not Progressing

## 2023-12-15 NOTE — Progress Notes (Signed)
 2 Days Post-Op   Subjective/Chief Complaint: Patient seen in hemodialysis No significant complaints except for some mild nausea She had a temporary colostomy many years ago   Objective: Vital signs in last 24 hours: Temp:  [97.5 F (36.4 C)-98.1 F (36.7 C)] 97.5 F (36.4 C) (09/06 0809) Pulse Rate:  [73-76] 75 (09/06 0824) Resp:  [17-19] 17 (09/06 0824) BP: (114-138)/(50-81) 132/81 (09/06 0824) SpO2:  [97 %-100 %] 98 % (09/06 0824) Weight:  [75.4 kg] 75.4 kg (09/06 0809) Last BM Date : 12/13/23  Intake/Output from previous day: 09/05 0701 - 09/06 0700 In: 717 [P.O.:717] Out: 0  Intake/Output this shift: No intake/output data recorded.  WDWN in NAD Abd - soft, incisional tenderness RUQ ostomy - pink, viable, brown stool output Midline wound - clean, minimal drainage, some mild maceration of the skin around the wound  Lab Results:  Recent Labs    12/14/23 0652 12/15/23 0830  WBC 17.4* 19.6*  HGB 8.9* 8.5*  HCT 26.6* 25.7*  PLT 347 446*   BMET Recent Labs    12/13/23 1358 12/14/23 0652  NA 137 139  K 3.4* 4.3  CL 96* 99  CO2 24 23  GLUCOSE 105* 145*  BUN 57* 67*  CREATININE 7.15* 7.91*  CALCIUM  9.6 9.1   Studies/Results: DG CHEST PORT 1 VIEW Result Date: 12/14/2023 CLINICAL DATA:  201257 ESRD (end stage renal disease) (HCC) 798742 [id number] Bilateral lower extremity edema [id number] EXAM: PORTABLE CHEST - 1 VIEW COMPARISON:  December 06, 2023 FINDINGS: Biapical pleural thickening. No focal airspace consolidation, pleural effusion, or pneumothorax. Mild cardiomegaly. Aortic atherosclerosis. No acute fracture or destructive lesions. Multilevel thoracic osteophytosis. IMPRESSION: No acute cardiopulmonary abnormality. Electronically Signed   By: Rogelia Myers M.D.   On: 12/14/2023 16:34   CT ABDOMEN PELVIS WO CONTRAST Addendum Date: 12/13/2023 ADDENDUM REPORT: 12/13/2023 14:05 ADDENDUM: The original report was by Dr. Ryan Salvage. The following addendum is by Dr.  Ryan Salvage: Critical Value/emergent results were called by telephone at the time of interpretation on 12/13/2023 at 2:05 pm to provider [facility] , who verbally acknowledged these results. Electronically Signed   By: Ryan Salvage M.D.   On: 12/13/2023 14:05   Result Date: 12/13/2023 CLINICAL DATA:  Invasive breast cancer. Periumbilical hernia with discoloration and drainage. Abdominal pain. End-stage renal disease on hemodialysis. * Tracking Code: BO * EXAM: CT ABDOMEN AND PELVIS WITHOUT CONTRAST TECHNIQUE: Multidetector CT imaging of the abdomen and pelvis was performed following the standard protocol without IV contrast. RADIATION DOSE REDUCTION: This exam was performed according to the departmental dose-optimization program which includes automated exposure control, adjustment of the mA and/or kV according to patient size and/or use of iterative reconstruction technique. COMPARISON:  11/19/2023 FINDINGS: Lower chest: Mild cardiomegaly. Coronary and aortic atherosclerosis. Hepatobiliary: Dependent density in the gallbladder favoring small gallstones. Otherwise unremarkable. Pancreas: Unremarkable Spleen: Unremarkable Adrenals/Urinary Tract: Stable appearance of multiple renal cysts of varying complexity. Atrophic right kidney. Stable mild nodularity of the left adrenal gland, internal density 28 Hounsfield units which is nonspecific. Stomach/Bowel: The left paracentral umbilical hernia contains a knuckle of transverse colon or a Richter hernia with surrounding abnormal extraluminal gas in the hernia favoring perforated viscus. The extraluminal gas is all contained to the hernia sac which measures about 5.6 by 4.4 cm on image 47 series 2. No current bowel dilatation although there is prominence of stool throughout the colon. Sigmoid colon diverticulosis. Postoperative findings in the right colon. Vascular/Lymphatic: Atherosclerosis is present, including aortoiliac atherosclerotic disease.  Atheromatous plaque at the origin of the celiac trunk and SMA. Enlarged precaval node 1.2 cm in short axis on image 35 series 2, formerly 1.3 cm. Reproductive: Uterus absent. Other: No supplemental non-categorized findings. Musculoskeletal: In addition to the left paraumbilical hernia, there is a left Spigelian or lateral abdominal wall hernia the level of the umbilicus containing adipose tissue shown on image 88 series 5. There is also laxity and potentially mild focal hernia of the right lateral abdominal wall musculature containing part of the terminal ileum on image 50 series 2. Mild grade 1 degenerative anterolisthesis at L4-5. IMPRESSION: 1. The left paracentral umbilical hernia contains a knuckle of transverse colon or a Richter hernia with surrounding abnormal extraluminal gas in the hernia sac indicating perforated viscus. The extraluminal gas is all contained to the hernia sac which measures about 5.6 by 4.4 cm. 2. There is also a left Spigelian or lateral abdominal wall hernia the level of the umbilicus containing adipose tissue. There is also laxity and potentially mild focal hernia of the right lateral abdominal wall musculature containing part of the terminal ileum. 3. Stable appearance of multiple renal cysts of varying complexity. Atrophic right kidney. 4. Stable mild nodularity of the left adrenal gland, internal density 28 Hounsfield units which is nonspecific. 5. Cholelithiasis. 6. Sigmoid colon diverticulosis. 7. Mild grade 1 degenerative anterolisthesis at L4-5. 8.  Aortic Atherosclerosis (ICD10-I70.0). Radiology assistant personnel have been notified to put me in telephone contact with the referring physician or the referring physician's clinical representative in order to discuss these findings. Once this communication is established I will issue an addendum to this report for documentation purposes. Electronically Signed: By: Ryan Salvage M.D. On: 12/13/2023 13:59     Anti-infectives: Anti-infectives (From admission, onward)    Start     Dose/Rate Route Frequency Ordered Stop   12/13/23 2200  piperacillin -tazobactam (ZOSYN ) IVPB 2.25 g        2.25 g 100 mL/hr over 30 Minutes Intravenous Every 8 hours 12/13/23 2113     12/13/23 2111  piperacillin -tazobactam (ZOSYN ) 3.375 GM/50ML IVPB       Note to Pharmacy: Verner Pierce C: cabinet override      12/13/23 2111 12/14/23 0914   12/13/23 1415  piperacillin -tazobactam (ZOSYN ) IVPB 3.375 g        3.375 g 100 mL/hr over 30 Minutes Intravenous  Once 12/13/23 1413 12/13/23 1653       Assessment/Plan: S/p ex lap with segmental TC resection with end colostomy, abdominal wall debridement, and ventral incisional hernia repair with vicryl mesh, Dr. Dasie 12/13/2023 for transverse colon perforation within an incarcerated ventral hernia -PT to mobilize -IS for pulm toilet -cont zosyn  x 5 days -Advance to FLD since patient is having some bowel function -WOC consult for colostomy (she has had one before, but 25 yrs ago) -multi-modal pain control -may be a candidate eventually for a wound VAC, but want to hold off on for several days to monitor her fascia as there is a concern for possible separation from poor tissue integrity.  Spoke with nursing about keeping the periwound skin dry   FEN - FLD, IVFs per primary VTE - heparin  SQ ID - Zosyn    MMP - per [facility] Dialysis per Renal    LOS: 2 days    Donnice MARLA Lima 12/15/2023

## 2023-12-16 DIAGNOSIS — K631 Perforation of intestine (nontraumatic): Secondary | ICD-10-CM | POA: Diagnosis not present

## 2023-12-16 LAB — RENAL FUNCTION PANEL
Albumin: 2.2 g/dL — ABNORMAL LOW (ref 3.5–5.0)
Anion gap: 17 — ABNORMAL HIGH (ref 5–15)
BUN: 52 mg/dL — ABNORMAL HIGH (ref 8–23)
CO2: 23 mmol/L (ref 22–32)
Calcium: 8.7 mg/dL — ABNORMAL LOW (ref 8.9–10.3)
Chloride: 95 mmol/L — ABNORMAL LOW (ref 98–111)
Creatinine, Ser: 6.04 mg/dL — ABNORMAL HIGH (ref 0.44–1.00)
GFR, Estimated: 7 mL/min — ABNORMAL LOW (ref >60)
Glucose, Bld: 87 mg/dL (ref 70–99)
Phosphorus: 4.2 mg/dL (ref 2.5–4.6)
Potassium: 4.2 mmol/L (ref 3.5–5.1)
Sodium: 135 mmol/L (ref 135–145)

## 2023-12-16 LAB — MAGNESIUM: Magnesium: 2 mg/dL (ref 1.7–2.4)

## 2023-12-16 LAB — CBC
HCT: 25.4 % — ABNORMAL LOW (ref 36.0–46.0)
Hemoglobin: 8.4 g/dL — ABNORMAL LOW (ref 12.0–15.0)
MCH: 31.2 pg (ref 26.0–34.0)
MCHC: 33.1 g/dL (ref 30.0–36.0)
MCV: 94.4 fL (ref 80.0–100.0)
Platelets: 374 K/uL (ref 150–400)
RBC: 2.69 MIL/uL — ABNORMAL LOW (ref 3.87–5.11)
RDW: 15.5 % (ref 11.5–15.5)
WBC: 12.5 K/uL — ABNORMAL HIGH (ref 4.0–10.5)
nRBC: 0 % (ref 0.0–0.2)

## 2023-12-16 MED ORDER — TRAMADOL HCL 50 MG PO TABS
50.0000 mg | ORAL_TABLET | Freq: Four times a day (QID) | ORAL | Status: DC | PRN
  Administered 2023-12-16: 50 mg via ORAL
  Filled 2023-12-16 (×2): qty 1

## 2023-12-16 MED ORDER — ALBUTEROL SULFATE (2.5 MG/3ML) 0.083% IN NEBU
2.5000 mg | INHALATION_SOLUTION | RESPIRATORY_TRACT | Status: DC | PRN
  Administered 2023-12-25: 2.5 mg via RESPIRATORY_TRACT
  Filled 2023-12-16: qty 3

## 2023-12-16 MED ORDER — ALBUTEROL SULFATE (2.5 MG/3ML) 0.083% IN NEBU
3.0000 mL | INHALATION_SOLUTION | Freq: Four times a day (QID) | RESPIRATORY_TRACT | Status: DC
  Administered 2023-12-16 (×3): 3 mL via RESPIRATORY_TRACT
  Filled 2023-12-16 (×4): qty 3

## 2023-12-16 MED ORDER — ROPINIROLE HCL 0.5 MG PO TABS
0.5000 mg | ORAL_TABLET | Freq: Two times a day (BID) | ORAL | Status: DC
Start: 2023-12-16 — End: 2023-12-23
  Administered 2023-12-16 – 2023-12-23 (×14): 0.5 mg via ORAL
  Filled 2023-12-16 (×14): qty 1

## 2023-12-16 MED ORDER — TRAMADOL HCL 50 MG PO TABS: 100.0000 mg | ORAL_TABLET | Freq: Four times a day (QID) | ORAL | Status: DC | PRN

## 2023-12-16 NOTE — Progress Notes (Signed)
 Mobility Specialist Progress Note:    12/16/23 1725  Mobility  Activity Turned to back - supine (Bed Level Exercise; Ankle Pumps, leg ext, quad set)  Level of Assistance Standby assist, set-up cues, supervision of patient - no hands on  Assistive Device None  Range of Motion/Exercises Left leg;Right leg  Activity Response Tolerated well  Mobility Referral Yes  Mobility visit 1 Mobility  Mobility Specialist Start Time (ACUTE ONLY) 1725  Mobility Specialist Stop Time (ACUTE ONLY) 1734  Mobility Specialist Time Calculation (min) (ACUTE ONLY) 9 min   Pt pleasant laying in bed agreeable to exercises and encouraged some light stretching for RLS. Pt able to bed level exercises (see note) d/t having previously ambulating with RN. Left pt in bed w/ all needs met.   Venetia Keel Mobility Specialist Please Neurosurgeon or Rehab Office at [phone]

## 2023-12-16 NOTE — Progress Notes (Signed)
 3 Days Post-Op   Subjective/Chief Complaint: No further nausea - tolerating full liquids without difficulty Feels like the oxycodone  makes her feel crazy Pain moderately well-controlled Beginning to have ostomy output WBC improving   Objective: Vital signs in last 24 hours: Temp:  [97.5 F (36.4 C)-99.2 F (37.3 C)] 98.5 F (36.9 C) (09/07 0734) Pulse Rate:  [73-89] 82 (09/07 0734) Resp:  [17-24] 18 (09/07 0734) BP: (98-153)/(42-111) 152/60 (09/07 0734) SpO2:  [92 %-98 %] 92 % (09/07 0734) Weight:  [74.4 kg-75.4 kg] 74.4 kg (09/06 1120) Last BM Date : 12/15/23  Intake/Output from previous day: 09/06 0701 - 09/07 0700 In: 260 [P.O.:260] Out: 1220 [Stool:220] Intake/Output this shift: No intake/output data recorded.  WDWN in NAD Abd - soft, incisional tenderness RUQ ostomy - pink, viable, brown stool output Midline wound - clean, minimal drainage, fascia intact  Lab Results:  Recent Labs    12/15/23 0830 12/16/23 0710  WBC 19.6* 12.5*  HGB 8.5* 8.4*  HCT 25.7* 25.4*  PLT 446* 374   BMET Recent Labs    12/15/23 0834 12/16/23 0710  NA 133* 135  K 4.5 4.2  CL 95* 95*  CO2 19* 23  GLUCOSE 103* 87  BUN 87* 52*  CREATININE 8.76* 6.04*  CALCIUM  8.5* 8.7*     Studies/Results: DG CHEST PORT 1 VIEW Result Date: 12/14/2023 CLINICAL DATA:  201257 ESRD (end stage renal disease) (HCC) [id number] Bilateral lower extremity edema [id number] EXAM: PORTABLE CHEST - 1 VIEW COMPARISON:  December 06, 2023 FINDINGS: Biapical pleural thickening. No focal airspace consolidation, pleural effusion, or pneumothorax. Mild cardiomegaly. Aortic atherosclerosis. No acute fracture or destructive lesions. Multilevel thoracic osteophytosis. IMPRESSION: No acute cardiopulmonary abnormality. Electronically Signed   By: Rogelia Myers M.D.   On: 12/14/2023 16:34    Anti-infectives: Anti-infectives (From admission, onward)    Start     Dose/Rate Route Frequency Ordered Stop   12/13/23  2200  piperacillin -tazobactam (ZOSYN ) IVPB 2.25 g        2.25 g 100 mL/hr over 30 Minutes Intravenous Every 8 hours 12/13/23 2113     12/13/23 2111  piperacillin -tazobactam (ZOSYN ) 3.375 GM/50ML IVPB       Note to Pharmacy: Verner Pierce C: cabinet override      12/13/23 2111 12/14/23 0914   12/13/23 1415  piperacillin -tazobactam (ZOSYN ) IVPB 3.375 g        3.375 g 100 mL/hr over 30 Minutes Intravenous  Once 12/13/23 1413 12/13/23 1653       Assessment/Plan: S/p ex lap with segmental TC resection with end colostomy, abdominal wall debridement, and ventral incisional hernia repair with vicryl mesh, Dr. Dasie 12/13/2023 for transverse colon perforation within an incarcerated ventral hernia -PT to mobilize -IS for pulm toilet -cont zosyn  x 5 days - end 12/18/23 -Advance to soft diet -WOC consult for colostomy, possible VAC. (she has had a colsotomy before, but 25 yrs ago) -multi-modal pain control -wound VAC may be difficult due to the proximity of the wound to the ostomy appliance.  Otherwise, continue wet to dry dressings - abdominal binder  LOS: 3 days    Rachel Villarreal 12/16/2023

## 2023-12-16 NOTE — Plan of Care (Signed)
 Pt up and ambulating to chair and in the room, tolerated soft diet well and without nausea. Pain control is good.

## 2023-12-16 NOTE — Progress Notes (Signed)
 [facility] Progress Note Patient: Rachel Villarreal FMW:[id number] DOB: [birth year]  DOA: 12/13/2023 DOS: the patient was seen and examined on 12/16/2023  Brief Hospital Course: Patient with PMH of ESRD on TTS, PAF on Eliquis , HFpEF, CAD, HLD, anemia of CKD, breast cancer on left presents to the hospital with complaints of abdominal wound. General surgery was consulted.  Patient underwent exploratory laparotomy with colon resection and colostomy with mesh and ventral hernia.  Assessment and Plan: Ventral incisional hernia with skin necrosis secondary to bowel perforation Status post exploratory laparotomy with colon resection and creation of colostomy as well as abdominal wall debridement with ventral incisional hernia repair. Management per surgery. Diet being advanced.  WOC nurse consulted for colostomy. On IV antibiotic IV Zosyn . Unfortunately no cultures were sent. Will monitor clinically.  ESRD on HD. Currently on HD TTS. Nephrology following.  History of SAH. Hospitalized on 8/28 and 8/30. Had a fall with SAH secondary to Eliquis . Currently holding anticoagulation.  Paroxysmal A-fib. On Toprol -XL.  50 mg daily.  Currently on Lopressor  25 mg twice daily. Holding Eliquis .  Will discuss with surgery with regards to resumption.  History of CAD. No active complaints.  Not on any antiplatelet medication. Monitor.  Scalp laceration. Suture to placed on 8/28. Removal in 10 days.  On 9/7.  Restless leg syndrome. Continue Requip .  Twice daily  GERD. Continue Protonix .  History of breast cancer. On anastrozole . Currently on hold.  Chronic insomnia. Continue Ambien  nightly.   Subjective: No nausea no vomiting no fever no chills.  Abdominal pain present.  Passing gas.  Physical Exam: Clear to auscultation back Bowel sound present No edema.  Data Reviewed: I have Reviewed nursing notes, Vitals, and Lab results. Since last encounter, pertinent lab results CBC and BMP    . I have ordered test including CBC and BMP  .   Disposition: Status is: Inpatient Remains inpatient appropriate because: Monitor for improvement in oral intake  heparin  injection 5,000 Units Start: 12/14/23 1400 SCDs Start: 12/14/23 0336 Family Communication: No one present Level of care: Telemetry Surgical   Vitals:   12/16/23 0734 12/16/23 0907 12/16/23 1439 12/16/23 1524  BP: (!) 152/60  (!) 140/53   Pulse: 82 84 81 80  Resp: 18 18 18 18   Temp: 98.5 F (36.9 C)  98.4 F (36.9 C)   TempSrc: Oral  Oral   SpO2: 92%  93%   Weight:         Author: Yetta Blanch, MD 12/16/2023 6:36 PM  Please look on [email or web address] to find out who is on call.

## 2023-12-16 NOTE — Progress Notes (Signed)
 [facility] Progress Note   Subjective:    Seen and examined patient at bedside. Around 1L removed during yesterday's HD. Informed patient felt v=nauseous near end of treatment causing UF to be turned off. Today, she reports feeling well. S/p segmental colon resection with creation of colostomy and incisional hernia repair with vicryl mesh on 9/4. Surgery following. Next HD 9/9.  Objective Vitals:   12/16/23 0734 12/16/23 0907 12/16/23 1439 12/16/23 1524  BP: (!) 152/60  (!) 140/53   Pulse: 82 84 81 80  Resp: 18 18 18 18   Temp: 98.5 F (36.9 C)  98.4 F (36.9 C)   TempSrc: Oral  Oral   SpO2: 92%  93%   Weight:       Physical Exam General: Awake, alert, on RA, NAD Heart: S1 and S2; No wheezing, gallops, or rubs Lungs: Clear anteriorly Abdomen: Soft, non-tender Extremities: No LE edema Dialysis Access: R AVF   Filed Weights   12/15/23 0809 12/15/23 1120  Weight: 75.4 kg 74.4 kg    Intake/Output Summary (Last 24 hours) at 12/16/2023 1629 Last data filed at 12/15/2023 2235 Gross per 24 hour  Intake 260 ml  Output 220 ml  Net 40 ml    Additional Objective Labs: Basic Metabolic Panel: Recent Labs  Lab 12/14/23 0652 12/15/23 0834 12/16/23 0710  NA 139 133* 135  K 4.3 4.5 4.2  CL 99 95* 95*  CO2 23 19* 23  GLUCOSE 145* 103* 87  BUN 67* 87* 52*  CREATININE 7.91* 8.76* 6.04*  CALCIUM  9.1 8.5* 8.7*  PHOS  --  7.6* 4.2   Liver Function Tests: Recent Labs  Lab 12/13/23 1358 12/15/23 0834 12/16/23 0710  AST 13*  --   --   ALT 11  --   --   ALKPHOS 132*  --   --   BILITOT 0.3  --   --   PROT 5.9*  --   --   ALBUMIN  3.2* 2.3* 2.2*   No results for input(s): LIPASE, AMYLASE in the last 168 hours. CBC: Recent Labs  Lab 12/13/23 1358 12/14/23 0652 12/15/23 0830 12/16/23 0710  WBC 17.2* 17.4* 19.6* 12.5*  NEUTROABS 14.8*  --   --   --   HGB 8.8* 8.9* 8.5* 8.4*  HCT 26.2* 26.6* 25.7* 25.4*  MCV 93.9 94.3 96.3 94.4  PLT 330 347 446* 374    Blood Culture    Component Value Date/Time   SDES  12/13/2023 1411    BLOOD LEFT ANTECUBITAL Performed at [facility], [address], [place], [place] [address]    [facility]  12/13/2023 1411    BOTTLES DRAWN AEROBIC AND ANAEROBIC Blood Culture adequate volume Performed at [facility], [address], [place], [place] [address]    CULT  12/13/2023 1411    NO GROWTH 3 DAYS Performed at [facility], [address] [place], [place] [address]    REPTSTATUS PENDING 12/13/2023 1411    Cardiac Enzymes: No results for input(s): CKTOTAL, CKMB, CKMBINDEX, TROPONINI in the last 168 hours. CBG: No results for input(s): GLUCAP in the last 168 hours. Iron Studies: No results for input(s): IRON, TIBC, TRANSFERRIN, FERRITIN in the last 72 hours. No results found for: INR, PROTIME Studies/Results: No results found.  Medications:  piperacillin -tazobactam (ZOSYN )  IV 2.25 g (12/16/23 1624)    acetaminophen   1,000 mg Oral Q6H   albuterol   3 mL Inhalation Q6H   atorvastatin   80 mg Oral q AM  Chlorhexidine  Gluconate Cloth  6 each Topical Q0600   febuxostat   40 mg Oral q AM   feeding supplement (NEPRO CARB STEADY)  237 mL Oral BID BM   heparin  injection (subcutaneous)  5,000 Units Subcutaneous Q8H   methocarbamol   500 mg Oral TID   metoprolol  tartrate  25 mg Oral BID   pantoprazole   40 mg Oral Daily   sevelamer  carbonate  1,600 mg Oral TID WC    Dialysis Orders: [facility] T[facility] 3h   B350   68.7kg   2K bath  AVF   Heparin  none Last hd 9/02 post wt 69.9kg  Mircera 30 mcg q 4, last 8/26   Home bp meds: Metoprolol  xl 50 hs Lasix  80 mg non hd days  Assessment/Plan:  Perforated bowel due to incarcerated hernia: S/p segmental colon resection with creation of colostomy and incisional hernia repair with vicryl mesh on 9/4. Continue IV zosyn  and pain meds. Per pmd/ surgery ESRD: on HD TTS. Next HD 9/9 BP: bp's  accepatble, follow.  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 Follow, transfuse prn.  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 No VDRA. Continue binders.  Charmaine Piety, NP [facility] 12/16/2023,4:29 PM  LOS: 3 days

## 2023-12-16 NOTE — Plan of Care (Signed)

## 2023-12-17 DIAGNOSIS — K631 Perforation of intestine (nontraumatic): Secondary | ICD-10-CM | POA: Diagnosis not present

## 2023-12-17 LAB — CBC
HCT: 25.4 % — ABNORMAL LOW (ref 36.0–46.0)
Hemoglobin: 8.5 g/dL — ABNORMAL LOW (ref 12.0–15.0)
MCH: 31.8 pg (ref 26.0–34.0)
MCHC: 33.5 g/dL (ref 30.0–36.0)
MCV: 95.1 fL (ref 80.0–100.0)
Platelets: 387 K/uL (ref 150–400)
RBC: 2.67 MIL/uL — ABNORMAL LOW (ref 3.87–5.11)
RDW: 15.4 % (ref 11.5–15.5)
WBC: 13 K/uL — ABNORMAL HIGH (ref 4.0–10.5)
nRBC: 0 % (ref 0.0–0.2)

## 2023-12-17 LAB — RENAL FUNCTION PANEL
Albumin: 2.1 g/dL — ABNORMAL LOW (ref 3.5–5.0)
Anion gap: 14 (ref 5–15)
BUN: 66 mg/dL — ABNORMAL HIGH (ref 8–23)
CO2: 26 mmol/L (ref 22–32)
Calcium: 8.5 mg/dL — ABNORMAL LOW (ref 8.9–10.3)
Chloride: 97 mmol/L — ABNORMAL LOW (ref 98–111)
Creatinine, Ser: 7.49 mg/dL — ABNORMAL HIGH (ref 0.44–1.00)
GFR, Estimated: 5 mL/min — ABNORMAL LOW (ref >60)
Glucose, Bld: 95 mg/dL (ref 70–99)
Phosphorus: 5.2 mg/dL — ABNORMAL HIGH (ref 2.5–4.6)
Potassium: 3.9 mmol/L (ref 3.5–5.1)
Sodium: 137 mmol/L (ref 135–145)

## 2023-12-17 LAB — SURGICAL PATHOLOGY

## 2023-12-17 LAB — MAGNESIUM: Magnesium: 2 mg/dL (ref 1.7–2.4)

## 2023-12-17 MED ORDER — ALBUTEROL SULFATE (2.5 MG/3ML) 0.083% IN NEBU
3.0000 mL | INHALATION_SOLUTION | Freq: Two times a day (BID) | RESPIRATORY_TRACT | Status: DC
  Administered 2023-12-17: 3 mL via RESPIRATORY_TRACT
  Filled 2023-12-17: qty 3

## 2023-12-17 MED ORDER — METHOCARBAMOL 500 MG PO TABS: 500.0000 mg | ORAL_TABLET | Freq: Three times a day (TID) | ORAL | Status: DC | PRN

## 2023-12-17 NOTE — Progress Notes (Signed)
 Mobility Specialist Progress Note:    12/17/23 1429  Mobility  Activity Ambulated with assistance (In room)  Level of Assistance Contact guard assist, steadying assist  Assistive Device Front wheel walker  Distance Ambulated (ft) 30 ft  Activity Response Tolerated well  Mobility Referral Yes  Mobility visit 1 Mobility  Mobility Specialist Start Time (ACUTE ONLY) 1409  Mobility Specialist Stop Time (ACUTE ONLY) 1425  Mobility Specialist Time Calculation (min) (ACUTE ONLY) 16 min   Received pt in bed and agreeable to mobility. Pt requested to ambulate in room. No physical assistance required. C/o abdominal pain, otherwise tolerated well. Pt requested to use the [facility]. Left pt in bed with alarm on. Personal belongings and call light within reach. All needs met.  Lavanda Pollack Mobility Specialist  Please contact via [employer] or  Rehab Office [phone]

## 2023-12-17 NOTE — Progress Notes (Signed)
 [facility] Progress Note   Subjective:    Seen and examined patient at bedside. No new complaint and hopefully for discharge soon.  S/p segmental colon resection with creation of colostomy and incisional hernia repair with vicryl mesh on 9/4. Surgery following. Next HD 9/9.  Objective Vitals:   12/16/23 1524 12/16/23 2048 12/17/23 0620 12/17/23 0803  BP:  (!) 148/64 (!) 155/95 (!) 150/85  Pulse: 80 95 87 89  Resp: 18 16 16 17   Temp:  98.4 F (36.9 C) 97.8 F (36.6 C) 98.4 F (36.9 C)  TempSrc:  Oral Oral Oral  SpO2:  94% 97% 94%  Weight:       Physical Exam General: Awake, alert, on RA, NAD Heart: S1 and S2; No wheezing, gallops, or rubs Lungs: Clear anteriorly Extremities: No LE edema Dialysis Access: R AVF +t/b  Filed Weights   12/15/23 0809 12/15/23 1120  Weight: 75.4 kg 74.4 kg   No intake or output data in the 24 hours ending 12/17/23 0927   Additional Objective Labs: Basic Metabolic Panel: Recent Labs  Lab 12/15/23 0834 12/16/23 0710 12/17/23 0326  NA 133* 135 137  K 4.5 4.2 3.9  CL 95* 95* 97*  CO2 19* 23 26  GLUCOSE 103* 87 95  BUN 87* 52* 66*  CREATININE 8.76* 6.04* 7.49*  CALCIUM  8.5* 8.7* 8.5*  PHOS 7.6* 4.2 5.2*   Liver Function Tests: Recent Labs  Lab 12/13/23 1358 12/15/23 0834 12/16/23 0710 12/17/23 0326  AST 13*  --   --   --   ALT 11  --   --   --   ALKPHOS 132*  --   --   --   BILITOT 0.3  --   --   --   PROT 5.9*  --   --   --   ALBUMIN  3.2* 2.3* 2.2* 2.1*   No results for input(s): LIPASE, AMYLASE in the last 168 hours. CBC: Recent Labs  Lab 12/13/23 1358 12/14/23 0652 12/15/23 0830 12/16/23 0710 12/17/23 0326  WBC 17.2* 17.4* 19.6* 12.5* 13.0*  NEUTROABS 14.8*  --   --   --   --   HGB 8.8* 8.9* 8.5* 8.4* 8.5*  HCT 26.2* 26.6* 25.7* 25.4* 25.4*  MCV 93.9 94.3 96.3 94.4 95.1  PLT 330 347 446* 374 387   Blood Culture    Component Value Date/Time   SDES  12/13/2023 1411    BLOOD LEFT  ANTECUBITAL Performed at [facility], [address] [place], [place] [address]    [facility]  12/13/2023 1411    BOTTLES DRAWN AEROBIC AND ANAEROBIC Blood Culture adequate volume Performed at [facility], [address] [place], [place] [address]    CULT  12/13/2023 1411    NO GROWTH 4 DAYS Performed at [facility], [address], [place], [place] [address]    REPTSTATUS PENDING 12/13/2023 1411    Cardiac Enzymes: No results for input(s): CKTOTAL, CKMB, CKMBINDEX, TROPONINI in the last 168 hours. CBG: No results for input(s): GLUCAP in the last 168 hours. Iron Studies: No results for input(s): IRON, TIBC, TRANSFERRIN, FERRITIN in the last 72 hours. No results found for: INR, PROTIME Studies/Results: No results found.  Medications:  piperacillin -tazobactam (ZOSYN )  IV 2.25 g (12/17/23 0624)    acetaminophen   1,000 mg Oral Q6H   albuterol   3 mL Inhalation BID   atorvastatin   80 mg Oral q AM   febuxostat   40 mg Oral q AM  feeding supplement (NEPRO CARB STEADY)  237 mL Oral BID BM   heparin  injection (subcutaneous)  5,000 Units Subcutaneous Q8H   methocarbamol   500 mg Oral TID   metoprolol  tartrate  25 mg Oral BID   pantoprazole   40 mg Oral Daily   rOPINIRole   0.5 mg Oral BID   sevelamer  carbonate  1,600 mg Oral TID WC    Dialysis Orders: [facility] 3h   B350   68.7kg   2K bath  AVF   Heparin  none Last hd 9/02 post wt 69.9kg  Mircera 30 mcg q 4, last 8/26   Home bp meds: Metoprolol  xl 50 hs Lasix  80 mg non hd days  Assessment/Plan:  Perforated bowel due to incarcerated hernia: S/p segmental colon resection with creation of colostomy and incisional hernia repair with vicryl mesh on 9/4. On IV pip/tazo through 9/9. Per pmd/ surgery ESRD: on HD TTS. Next HD 9/9 BP: bp's accepatble, follow.  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 Follow,  transfuse prn.  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 No VDRA. Continue binders.  Will follow as she remains admitted.   Manuelita Barters MD [facility] Pager [phone]

## 2023-12-17 NOTE — Progress Notes (Signed)
 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 General surgery was consulted.  Patient underwent exploratory laparotomy with colon resection and colostomy with mesh and ventral hernia.  Assessment and Plan: Ventral incisional hernia with skin necrosis secondary to bowel perforation Status post exploratory laparotomy with colon resection and creation of colostomy as well as abdominal wall debridement with ventral incisional hernia repair. Management per surgery. Diet being advanced.  WOC nurse consulted for colostomy. On IV antibiotic IV Zosyn .  Last day tomorrow. Unfortunately no cultures were sent. Will monitor clinically.  ESRD on HD. Currently on HD TTS. Nephrology following.  History of SAH. Hospitalized on 8/28 and 8/30. Had a fall with SAH secondary to Eliquis . Currently holding anticoagulation.  Paroxysmal A-fib. On Toprol -XL.  50 mg daily.  Currently on Lopressor  25 mg twice daily. Holding Eliquis .  Will discuss with surgery with regards to resumption.  History of CAD. No active complaints.  Not on any antiplatelet medication. Monitor.  Scalp laceration. Suture to placed on 8/28. Removal in 10 days.  On 9/7.  Restless leg syndrome. Continue Requip .  Twice daily  GERD. Continue Protonix .  History of breast cancer. On anastrozole . Currently on hold.  Chronic insomnia. Continue Ambien  nightly.   Subjective: Denies any acute complaint.  No nausea no vomiting. Lives alone at home.  Physical Exam: Clear to auscultation. Bowel sound present.   no edema. No focal deficit  Data Reviewed: I have Reviewed nursing notes, Vitals, and Lab results. Since last encounter,  pertinent lab results CBC and BMP   . I have ordered test including CBC and BMP  .   Disposition: Status is: Inpatient Remains inpatient appropriate because: Monitor for improvement in renal function and oral intake  heparin  injection 5,000 Units Start: 12/14/23 1400 SCDs Start: 12/14/23 0336  Family Communication: No one at bedside Level of care: Telemetry Surgical   Vitals:   12/17/23 0620 12/17/23 0803 12/17/23 1011 12/17/23 1622  BP: (!) 155/95 (!) 150/85 (!) 150/85 (!) 137/55  Pulse: 87 89 89 83  Resp: 16 17  17   Temp: 97.8 F (36.6 C) 98.4 F (36.9 C)  98.4 F (36.9 C)  TempSrc: Oral Oral  Oral  SpO2: 97% 94%  96%  Weight:         Author: Yetta Blanch, MD 12/17/2023 6:40 PM  Please look on [email or web address] to find out who is on call.

## 2023-12-17 NOTE — Plan of Care (Signed)
   Problem: Coping: Goal: Level of anxiety will decrease Outcome: Progressing   Problem: Pain Managment: Goal: General experience of comfort will improve and/or be controlled Outcome: Progressing   Problem: Safety: Goal: Ability to remain free from injury will improve Outcome: Progressing

## 2023-12-17 NOTE — Care Management Important Message (Signed)
 Important Message  Patient Details  Name: Rachel Villarreal MRN: [id number] Date of Birth: [birth year]   Important Message Given:  Yes - Medicare IM     Jon Cruel 12/17/2023, 1:05 PM

## 2023-12-17 NOTE — Consult Note (Addendum)
 WOC Nurse ostomy follow up Continued ostomy teaching and management, appliance was about to leak on side, no barrier ring noted Stoma type/location: RUQ, end transverse colostomy Stomal assessment/size: 40 mm round, viable, pink, moist, well-budded Peristomal assessment: intact plane Treatment options for stomal/peristomal skin:  Output 200 mls liquid brownish effluent Ostomy pouching: 2pc.  Education provided: Patient appeared sleepy and her eyes would sometime close during this teaching session, this could be related to pain medication being given, reviewed: frequency of change out, skin care needs, effluent output, hydration needs, booklet in room. Patient stated she had a temp colostomy many years ago.   Enrolled patient in [facility] Start Discharge program: no    WOC Nurse Consult Note:  Reason for Consult: mid-abd open surgical wound, provider would like to know if VAC therapy would benefit for this patient, would speed up wound healing, wound contraction may occur. Patient was open to having VAC therapy once it was explained.  Patient is a retired Teacher, early years/pre.  Wound type: surgical open cavity Pressure Injury POA: Yes/No/NA Measurement: approx 9x6x5 Wound bed: at base suturing noted, cavity walls clean,no malodor Drainage serosanguinous  Periwound: intact Dressing procedure/placement/frequency: wet to moist NSS gauze; reapplied after inspection, recommend silicone contact layer at base with black foam.   Please reconsult if wound worsens in condition and notify provider. This patient remains on follow-up for ostomy education and possible npwt once approved by provider.   Sherrilyn Hals MSN RN CWOCN WOC [facility]  [phone] (Available from 7-3 pm Mon-Friday)

## 2023-12-17 NOTE — Progress Notes (Signed)
 Physical Therapy Treatment Patient Details Name: Rachel Villarreal MRN: [id number] DOB: 06/17/1945 Today's Date: 12/17/2023   History of Present Illness 78 y.o. female admitted on 12/13/23 s/p ex lap with segmental TC resection with end colostomy, abdominal wall debridement, and ventral incisional hernia repair with vicryl mesh, Dr. Dasie 12/13/2023 for transverse colon perforation within an incarcerated ventral hernia  PMH: HTN, ESRD on HD, breast cancer, CAD, HFimpEF, PAF on eliquis .    PT Comments  Pt resting in bed on arrival, pleasant and agreeable to session with encouragement as pt reporting fatigue. Pt able to complete bed mobility, transfers and in room ambulation with RW for support with grossly CGA for safety. Pt fatigues quickly needing seated rest after ~40' of gait with cadence slowing with distance. Educated pt on importance of frequent mobilization and time up OOB with pt verbalizing understanding. Current plan remains appropriate to address deficits and maximize functional independence and safety. Pt continues to benefit from skilled PT services to progress toward functional mobility goals.     If plan is discharge home, recommend the following: Assist for transportation;A little help with walking and/or transfers;A little help with bathing/dressing/bathroom;Help with stairs or ramp for entrance;Assistance with cooking/housework   Can travel by private vehicle        Equipment Recommendations  None recommended by PT    Recommendations for Other Services       Precautions / Restrictions Precautions Precautions: Fall Recall of Precautions/Restrictions: Intact Precaution/Restrictions Comments: abdominal precautions Restrictions Weight Bearing Restrictions Per Provider Order: No     Mobility  Bed Mobility Overal bed mobility: Needs Assistance Bed Mobility: Supine to Sit, Sit to Supine     Supine to sit: Supervision Sit to supine: Supervision   General bed mobility  comments: supervision for safety no assist needed    Transfers Overall transfer level: Needs assistance Equipment used: Rolling walker (2 wheels) Transfers: Sit to/from Stand, Bed to chair/wheelchair/BSC Sit to Stand: Contact guard assist   Step pivot transfers: Contact guard assist       General transfer comment: CGA with cues for hand placement    Ambulation/Gait Ambulation/Gait assistance: Contact guard assist Gait Distance (Feet): 45 Feet Assistive device: Rolling walker (2 wheels) Gait Pattern/deviations: Step-through pattern, Decreased stride length Gait velocity: decr     General Gait Details: steady in room gait with no overt LOB, slowing with distance, cues for closer RW proximity   Stairs             Wheelchair Mobility     Tilt Bed    Modified Rankin (Stroke Patients Only)       Balance Overall balance assessment: Mild deficits observed, not formally tested                                          Communication Communication Communication: No apparent difficulties  Cognition Arousal: Alert Behavior During Therapy: WFL for tasks assessed/performed   PT - Cognitive impairments: No apparent impairments                       PT - Cognition Comments: some increased time for word finding Following commands: Intact      Cueing Cueing Techniques: Verbal cues, Tactile cues  Exercises      General Comments        Pertinent Vitals/Pain Pain Assessment Pain Assessment: No/denies pain  Home Living                          Prior Function            PT Goals (current goals can now be found in the care plan section) Acute Rehab PT Goals Patient Stated Goal: Patient's goal is to return home. PT Goal Formulation: With patient Time For Goal Achievement: 12/29/23 Progress towards PT goals: Progressing toward goals    Frequency    Min 2X/week      PT Plan      Co-evaluation               AM-PAC PT 6 Clicks Mobility   Outcome Measure  Help needed turning from your back to your side while in a flat bed without using bedrails?: A Little Help needed moving from lying on your back to sitting on the side of a flat bed without using bedrails?: A Little Help needed moving to and from a bed to a chair (including a wheelchair)?: A Little Help needed standing up from a chair using your arms (e.g., wheelchair or bedside chair)?: A Little Help needed to walk in hospital room?: A Lot (<40') Help needed climbing 3-5 steps with a railing? : A Lot 6 Click Score: 16    End of Session   Activity Tolerance: Patient tolerated treatment well Patient left: with call bell/phone within reach;in bed;with bed alarm set Nurse Communication: Mobility status PT Visit Diagnosis: Other abnormalities of gait and mobility (R26.89);Unsteadiness on feet (R26.81)     Time: 9084-9062 PT Time Calculation (min) (ACUTE ONLY): 22 min  Charges:    $Therapeutic Exercise: 8-22 mins PT General Charges $$ ACUTE PT VISIT: 1 Visit                     Mckensi Redinger R. PTA Acute Rehabilitation Services Office: [phone]   [name] 12/17/2023, 9:43 AM

## 2023-12-17 NOTE — TOC Progression Note (Addendum)
 Transition of Care (TOC) - Progression Note   Orders for HHPT and HHRN for VAC dressing changes Monday and Thursdays. Discussed with patinet.   Provided Medicare.gov list of home health agencies with ratings. Patient's first choice is [facility]. Artvia with [facility] accepted referral for [facility] and HHPT. Patient aware , information on AVS.   NCM asked CCS team to sign script for home VAC. Once signed will submit to [facility] with [employer] ( [facility]) for home VAC.   If insurance approves home VAC , NCM will bring home VAC and some dressing supplies to patient's room prior to discharge. On day of discharge hospital nurse will connect home VAC to dressing. [employer] will ship more dressing supplies to patient's address.   Patient voiced understanding   Notified Randine  . Home [facility] script signed and emailed to [facility]  Patient Details  Name: Rachel Villarreal MRN: [id number] Date of Birth: Nov 24, 1945  Transition of Care [facility]) CM/SW Contact  Katerina Zurn, Powell Jansky, RN Phone Number: 12/17/2023, 12:33 PM  Clinical Narrative:       Expected Discharge Plan: Home w Home Health Services Barriers to Discharge: Continued Medical Work up               Expected Discharge Plan and Services   Discharge Planning Services: CM Consult Post Acute Care Choice: Home Health Living arrangements for the past 2 months: Single Family Home                 DME Arranged: N/A         HH Arranged:  (see note)           Social Drivers of Health (SDOH) Interventions SDOH Screenings   Food Insecurity: No Food Insecurity (12/13/2023)  Housing: Low Risk  (12/13/2023)  Transportation Needs: No Transportation Needs (12/13/2023)  Utilities: Not At Risk (12/13/2023)  Depression (PHQ2-9): Low Risk  (02/28/2022)  Financial Resource Strain: Low Risk  (09/02/2021)  Social Connections: Moderately Integrated (12/13/2023)  Stress: No Stress Concern Present (09/02/2021)  Tobacco Use: Medium Risk (12/13/2023)    Readmission Risk  Interventions     No data to display

## 2023-12-17 NOTE — Progress Notes (Signed)
 Progress Note  4 Days Post-Op  Subjective: Patient using bedside commode upon entering room. Offered to come back and patient wanted to proceed with encounter. PT also in the room during encounter.   Patient reports that her pain is well controlled. Having brown thin stool output from ostomy. Reports flatus as well. Tolerating soft diet without nausea or vomiting.   ROS  All negative with the exception of above.  Objective: Vital signs in last 24 hours: Temp:  [97.8 F (36.6 C)-98.4 F (36.9 C)] 98.4 F (36.9 C) (09/08 0803) Pulse Rate:  [80-95] 89 (09/08 0803) Resp:  [16-18] 17 (09/08 0803) BP: (140-155)/(53-95) 150/85 (09/08 0803) SpO2:  [93 %-97 %] 94 % (09/08 0803) Last BM Date : 12/15/23  Intake/Output from previous day: No intake/output data recorded. Intake/Output this shift: No intake/output data recorded.  PE: General: Pleasant female who is laying in bed in NAD. Lungs: Respiratory effort nonlabored Abd: Soft, NT. Mild distention. Ostomy noted of right abdomen. Stoma viable. Brown thin stool and gas noted in the ostomy pouch. Midline incision covered with dressing. Dressing clean. Unable to assess wound while patient on bedside commode. No rebound tenderness or guarding.  Skin: Warm and dry with no masses, lesions, or rashes Psych: A&Ox3 with an appropriate affect.    Lab Results:  Recent Labs    12/16/23 0710 12/17/23 0326  WBC 12.5* 13.0*  HGB 8.4* 8.5*  HCT 25.4* 25.4*  PLT 374 387   BMET Recent Labs    12/16/23 0710 12/17/23 0326  NA 135 137  K 4.2 3.9  CL 95* 97*  CO2 23 26  GLUCOSE 87 95  BUN 52* 66*  CREATININE 6.04* 7.49*  CALCIUM  8.7* 8.5*   PT/INR No results for input(s): LABPROT, INR in the last 72 hours. CMP     Component Value Date/Time   NA 137 12/17/2023 0326   NA 141 05/10/2023 1314   K 3.9 12/17/2023 0326   CL 97 (L) 12/17/2023 0326   CO2 26 12/17/2023 0326   GLUCOSE 95 12/17/2023 0326   BUN 66 (H) 12/17/2023  0326   BUN 40 (H) 05/10/2023 1314   CREATININE 7.49 (H) 12/17/2023 0326   CREATININE 5.78 (H) 11/19/2023 1140   CALCIUM  8.5 (L) 12/17/2023 0326   PROT 5.9 (L) 12/13/2023 1358   PROT 5.5 (L) 05/10/2023 1314   ALBUMIN  2.1 (L) 12/17/2023 0326   ALBUMIN  3.4 (L) 05/10/2023 1314   AST 13 (L) 12/13/2023 1358   AST 14 (L) 11/19/2023 1140   ALT 11 12/13/2023 1358   ALT 11 11/19/2023 1140   ALKPHOS 132 (H) 12/13/2023 1358   BILITOT 0.3 12/13/2023 1358   BILITOT 0.4 11/19/2023 1140   GFRNONAA 5 (L) 12/17/2023 0326   GFRNONAA 7 (L) 11/19/2023 1140   Lipase  No results found for: LIPASE     Studies/Results: No results found.  Anti-infectives: Anti-infectives (From admission, onward)    Start     Dose/Rate Route Frequency Ordered Stop   12/13/23 2200  piperacillin -tazobactam (ZOSYN ) IVPB 2.25 g        2.25 g 100 mL/hr over 30 Minutes Intravenous Every 8 hours 12/13/23 2113 12/18/23 2359   12/13/23 2111  piperacillin -tazobactam (ZOSYN ) 3.375 GM/50ML IVPB       Note to Pharmacy: Golob, Jamie C: cabinet override      12/13/23 2111 12/14/23 0914   12/13/23 1415  piperacillin -tazobactam (ZOSYN ) IVPB 3.375 g        3.375 g 100 mL/hr over  30 Minutes Intravenous  Once 12/13/23 1413 12/13/23 1653        Assessment/Plan POD4: S/p ex lap with segmental TC resection with end colostomy, abdominal wall debridement, and ventral incisional hernia repair with vicryl mesh, Dr. Dasie 12/13/2023 for transverse colon perforation within an incarcerated ventral hernia. -Afebrile. Hypertensive. Hemodynamically stable. -WBC 13.0 from 12.5; HGB 8.5 from 8.4; Cont zosyn  x 5 days - end 12/18/23. -Tolerating soft diet without n/v. Having ostomy output with flatus. Pain manageable. -Pain manageable. Continue multi-modal pain control. -Continue to mobilize as tolerated with PT; PT recommending home health PT. -IS for pulm toilet -WOC consulted for colostomy, possible VAC. (she has had a colsotomy before, but  25 yrs ago); Wound VAC may be difficult due to the proximity of the wound to the ostomy appliance. Otherwise, continue wet to dry dressings. Will try and collaborate with WOC team to get picture for patient chart as I was not able to assess during this encounter. - Continue abdominal binder  FEN: Soft; feeding supplement; IVF per primary team VTE: Scds; Heparin  injections ID: [email or web address]     LOS: 4 days   I reviewed specialist notes, hospitalist notes, last 24 h vitals and pain scores, last 48 h intake and output, last 24 h labs and trends, and last 24 h imaging results.   Marjorie Carlyon Favre, [facility] 12/17/2023, 9:55 AM Please see Amion for pager number during day hours 7:00am-4:30pm

## 2023-12-17 NOTE — Progress Notes (Incomplete)
 Patient refused 1600 scheduled Robaxin . Patient says she believes medication makes her feel loopy Notified Dr. Yetta Blanch.

## 2023-12-18 DIAGNOSIS — K631 Perforation of intestine (nontraumatic): Secondary | ICD-10-CM | POA: Diagnosis not present

## 2023-12-18 LAB — CULTURE, BLOOD (ROUTINE X 2)
Culture: NO GROWTH
Culture: NO GROWTH
Special Requests: ADEQUATE
Special Requests: ADEQUATE

## 2023-12-18 LAB — CBC
HCT: 25.1 % — ABNORMAL LOW (ref 36.0–46.0)
Hemoglobin: 8.2 g/dL — ABNORMAL LOW (ref 12.0–15.0)
MCH: 31.3 pg (ref 26.0–34.0)
MCHC: 32.7 g/dL (ref 30.0–36.0)
MCV: 95.8 fL (ref 80.0–100.0)
Platelets: 393 K/uL (ref 150–400)
RBC: 2.62 MIL/uL — ABNORMAL LOW (ref 3.87–5.11)
RDW: 15.3 % (ref 11.5–15.5)
WBC: 16.6 K/uL — ABNORMAL HIGH (ref 4.0–10.5)
nRBC: 0 % (ref 0.0–0.2)

## 2023-12-18 LAB — RENAL FUNCTION PANEL
Albumin: 2 g/dL — ABNORMAL LOW (ref 3.5–5.0)
Anion gap: 21 — ABNORMAL HIGH (ref 5–15)
BUN: 76 mg/dL — ABNORMAL HIGH (ref 8–23)
CO2: 21 mmol/L — ABNORMAL LOW (ref 22–32)
Calcium: 8.8 mg/dL — ABNORMAL LOW (ref 8.9–10.3)
Chloride: 96 mmol/L — ABNORMAL LOW (ref 98–111)
Creatinine, Ser: 8.65 mg/dL — ABNORMAL HIGH (ref 0.44–1.00)
GFR, Estimated: 4 mL/min — ABNORMAL LOW (ref >60)
Glucose, Bld: 67 mg/dL — ABNORMAL LOW (ref 70–99)
Phosphorus: 5.1 mg/dL — ABNORMAL HIGH (ref 2.5–4.6)
Potassium: 4.4 mmol/L (ref 3.5–5.1)
Sodium: 138 mmol/L (ref 135–145)

## 2023-12-18 LAB — MAGNESIUM: Magnesium: 2 mg/dL (ref 1.7–2.4)

## 2023-12-18 NOTE — TOC Progression Note (Signed)
 Transition of Care [facility]) - Progression Note    Patient Details  Name: Rachel Villarreal MRN: [id number] Date of Birth: [birth year]  Transition of Care [facility]) CM/SW Contact  Selda Jalbert LITTIE Moose, [place] Phone Number: 12/18/2023, 1:43 PM  Clinical Narrative:    CSW left Access [place] information on pt side table in room- pt in dialysis when CSW dropped off info.   Expected Discharge Plan: Home w Home Health Services Barriers to Discharge: Continued Medical Work up               Expected Discharge Plan and Services   Discharge Planning Services: CM Consult Post Acute Care Choice: Home Health Living arrangements for the past 2 months: Single Family Home                 DME Arranged: N/A         HH Arranged:  (see note)           Social Drivers of Health (SDOH) Interventions SDOH Screenings   Food Insecurity: No Food Insecurity (12/13/2023)  Housing: Low Risk  (12/13/2023)  Transportation Needs: No Transportation Needs (12/13/2023)  Utilities: Not At Risk (12/13/2023)  Depression (PHQ2-9): Low Risk  (02/28/2022)  Financial Resource Strain: Low Risk  (09/02/2021)  Social Connections: Moderately Integrated (12/13/2023)  Stress: No Stress Concern Present (09/02/2021)  Tobacco Use: Medium Risk (12/13/2023)    Readmission Risk Interventions     No data to display

## 2023-12-18 NOTE — Plan of Care (Signed)
   Problem: Activity: Goal: Risk for activity intolerance will decrease Outcome: Progressing   Problem: Nutrition: Goal: Adequate nutrition will be maintained Outcome: Progressing   Problem: Pain Managment: Goal: General experience of comfort will improve and/or be controlled Outcome: Progressing   Problem: Safety: Goal: Ability to remain free from injury will improve Outcome: Progressing

## 2023-12-18 NOTE — Progress Notes (Addendum)
 Progress Note  5 Days Post-Op  Subjective: Patient reports pain is minimal and well controlled. Continuing to have thin brown stool output from ostomy. Reports flatulence into ostomy pouch. Tolerating soft diet and denies nausea and vomiting.  ROS  All negative with the exception of above.  Objective: Vital signs in last 24 hours: Temp:  [98 F (36.7 C)-98.4 F (36.9 C)] 98 F (36.7 C) (09/09 0754) Pulse Rate:  [73-93] 84 (09/09 0754) Resp:  [16-17] 17 (09/09 0754) BP: (118-150)/(52-90) 150/52 (09/09 0754) SpO2:  [94 %-96 %] 94 % (09/09 0754) Last BM Date : 12/17/23  Intake/Output from previous day: 09/08 0701 - 09/09 0700 In: 240 [P.O.:240] Out: -  Intake/Output this shift: No intake/output data recorded.  PE: General: Pleasant female who is laying in bed in NAD. Lungs: Respiratory effort nonlabored Abd: Soft, NT. Mild distention. Ostomy noted of right abdomen. Stoma viable. Brown thin stool and gas noted in the ostomy pouch. Midline incision covered with dressing. Dressing clean. Midline wound as pictured from 9/8:  Skin: Warm and dry with no masses, lesions, or rashes Psych: A&Ox3 with an appropriate affect.    Lab Results:  Recent Labs    12/16/23 0710 12/17/23 0326  WBC 12.5* 13.0*  HGB 8.4* 8.5*  HCT 25.4* 25.4*  PLT 374 387   BMET Recent Labs    12/16/23 0710 12/17/23 0326  NA 135 137  K 4.2 3.9  CL 95* 97*  CO2 23 26  GLUCOSE 87 95  BUN 52* 66*  CREATININE 6.04* 7.49*  CALCIUM  8.7* 8.5*   PT/INR No results for input(s): LABPROT, INR in the last 72 hours. CMP     Component Value Date/Time   NA 137 12/17/2023 0326   NA 141 05/10/2023 1314   K 3.9 12/17/2023 0326   CL 97 (L) 12/17/2023 0326   CO2 26 12/17/2023 0326   GLUCOSE 95 12/17/2023 0326   BUN 66 (H) 12/17/2023 0326   BUN 40 (H) 05/10/2023 1314   CREATININE 7.49 (H) 12/17/2023 0326   CREATININE 5.78 (H) 11/19/2023 1140   CALCIUM  8.5 (L) 12/17/2023 0326   PROT 5.9 (L)  12/13/2023 1358   PROT 5.5 (L) 05/10/2023 1314   ALBUMIN  2.1 (L) 12/17/2023 0326   ALBUMIN  3.4 (L) 05/10/2023 1314   AST 13 (L) 12/13/2023 1358   AST 14 (L) 11/19/2023 1140   ALT 11 12/13/2023 1358   ALT 11 11/19/2023 1140   ALKPHOS 132 (H) 12/13/2023 1358   BILITOT 0.3 12/13/2023 1358   BILITOT 0.4 11/19/2023 1140   GFRNONAA 5 (L) 12/17/2023 0326   GFRNONAA 7 (L) 11/19/2023 1140   Lipase  No results found for: LIPASE     Studies/Results: No results found.  Anti-infectives: Anti-infectives (From admission, onward)    Start     Dose/Rate Route Frequency Ordered Stop   12/13/23 2200  piperacillin -tazobactam (ZOSYN ) IVPB 2.25 g        2.25 g 100 mL/hr over 30 Minutes Intravenous Every 8 hours 12/13/23 2113 12/18/23 2359   12/13/23 2111  piperacillin -tazobactam (ZOSYN ) 3.375 GM/50ML IVPB       Note to Pharmacy: Golob, Jamie C: cabinet override      12/13/23 2111 12/14/23 0914   12/13/23 1415  piperacillin -tazobactam (ZOSYN ) IVPB 3.375 g        3.375 g 100 mL/hr over 30 Minutes Intravenous  Once 12/13/23 1413 12/13/23 1653        Assessment/Plan POD5: S/p ex lap with segmental TC resection  with end colostomy, abdominal wall debridement, and ventral incisional hernia repair with vicryl mesh, Dr. Dasie 12/13/2023 for transverse colon perforation within an incarcerated ventral hernia. -Afebrile. Hypertensive. Hemodynamically stable. -Labs pending for today. -Cont zosyn  x 5 days - end 12/18/23. -Tolerating soft diet without n/v. Having ostomy output with flatus. -Pain manageable. Continue multi-modal pain control. -Continue to mobilize as tolerated with PT; PT recommending home health PT. -IS for pulm toilet -WOC consulted for colostomy, possible VAC 9/8. Plans for wound vac to be placed today. - Continue abdominal binder   FEN: Soft; feeding supplement; IVF per primary team VTE: Scds; Heparin  injections ID: [email or web address]     LOS: 5 days   I reviewed specialist notes,  hospitalist notes, last 24 h vitals and pain scores, last 48 h intake and output, last 24 h labs and trends, and last 24 h imaging results.   Rachel Villarreal, [facility] 12/18/2023, 8:09 AM Please see Amion for pager number during day hours 7:00am-4:30pm

## 2023-12-18 NOTE — Progress Notes (Signed)
 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 General surgery was consulted.  Patient underwent exploratory laparotomy with colon resection and colostomy with mesh and ventral hernia.  Assessment and Plan: Ventral incisional hernia with skin necrosis secondary to bowel perforation Status post exploratory laparotomy with colon resection and creation of colostomy as well as abdominal wall debridement with ventral incisional hernia repair. Management per surgery. Diet being advanced.  WOC nurse consulted for colostomy. On IV antibiotic IV Zosyn .  Antibiotic completed.  Elevated white count for now. Has a wound VAC placed.  Will monitor for improvement. Unfortunately no cultures were sent. Will monitor clinically.  ESRD on HD. Currently on HD TTS. Nephrology following.  History of SAH. Hospitalized on 8/28 and 8/30. Had a fall with SAH secondary to Eliquis . Currently holding anticoagulation.  Paroxysmal A-fib. On Toprol -XL.  50 mg daily.  Currently on Lopressor  25 mg twice daily. Eliquis  on hold after her recent [facility].  History of CAD. No active complaints.  Not on any antiplatelet medication. Monitor.  Scalp laceration. Suture to placed on 8/28.   Restless leg syndrome. Continue Requip .  Twice daily  GERD. Continue Protonix .  History of breast cancer. On anastrozole . Currently on hold.  Chronic insomnia. Continue Ambien  nightly.   Subjective: No nausea no vomiting no fever no chills.  Mentation improving.  Physical Exam: Clear to auscultation. S1-S2 present Bowel sound present.  Data Reviewed: I have Reviewed nursing notes, Vitals, and Lab results. Since last encounter,  pertinent lab results CBC and BMP   . I have ordered test including CBC and BMP  .   Disposition: Status is: Inpatient Remains inpatient appropriate because: Monitor for improvement in postop recovery  heparin  injection 5,000 Units Start: 12/14/23 1400 SCDs Start: 12/14/23 0336   Family Communication: No one at bedside. Level of care: Telemetry Surgical monitoring Vitals:   12/18/23 1217 12/18/23 1225 12/18/23 1323 12/18/23 1752  BP: (!) 172/68  (!) 152/63 (!) 151/62  Pulse: 94  64 86  Resp: 18  17 18   Temp: 98.3 F (36.8 C)  (!) 97.1 F (36.2 C) (!) 97.5 F (36.4 C)  TempSrc:      SpO2: 98%  94% 97%  Weight:      Height:  5' 2 (1.575 m)       Author: Yetta Blanch, MD 12/18/2023 7:22 PM  Please look on [email or web address] to find out who is on call.

## 2023-12-18 NOTE — Progress Notes (Signed)
 [facility] Progress Note   Subjective:    Seen and examined patient at bedside in dialysis.  Plans for wound vac today.  PT rec home health.  Limited po intake but tolerating, thirsty.   Objective Vitals:   12/18/23 0423 12/18/23 0754 12/18/23 0846 12/18/23 0859  BP: (!) 135/90 (!) 150/52 (!) 150/67 (!) 152/57  Pulse: 90 84 84 80  Resp: 16 17 17 16   Temp: 98 F (36.7 C) 98 F (36.7 C) 98.6 F (37 C)   TempSrc: Oral Oral    SpO2: 94% 94% 95% 97%  Weight:   73.1 kg    Physical Exam General: Awake, alert, on RA, NAD ENT: dry MM Heart: S1 and S2; No wheezing, gallops, or rubs Lungs: Clear anteriorly Extremities: No LE edema Dialysis Access: R AVF +t/b  Filed Weights   12/15/23 0809 12/15/23 1120 12/18/23 0846  Weight: 75.4 kg 74.4 kg 73.1 kg    Intake/Output Summary (Last 24 hours) at 12/18/2023 1000 Last data filed at 12/17/2023 1621 Gross per 24 hour  Intake 120 ml  Output --  Net 120 ml     Additional Objective Labs: Basic Metabolic Panel: Recent Labs  Lab 12/16/23 0710 12/17/23 0326 12/18/23 0250  NA 135 137 138  K 4.2 3.9 4.4  CL 95* 97* 96*  CO2 23 26 21*  GLUCOSE 87 95 67*  BUN 52* 66* 76*  CREATININE 6.04* 7.49* 8.65*  CALCIUM  8.7* 8.5* 8.8*  PHOS 4.2 5.2* 5.1*   Liver Function Tests: Recent Labs  Lab 12/13/23 1358 12/15/23 0834 12/16/23 0710 12/17/23 0326 12/18/23 0250  AST 13*  --   --   --   --   ALT 11  --   --   --   --   ALKPHOS 132*  --   --   --   --   BILITOT 0.3  --   --   --   --   PROT 5.9*  --   --   --   --   ALBUMIN  3.2*   < > 2.2* 2.1* 2.0*   < > = values in this interval not displayed.   No results for input(s): LIPASE, AMYLASE in the last 168 hours. CBC: Recent Labs  Lab 12/13/23 1358 12/14/23 0652 12/15/23 0830 12/16/23 0710 12/17/23 0326 12/18/23 0250  WBC 17.2* 17.4* 19.6* 12.5* 13.0* 16.6*  NEUTROABS 14.8*  --   --   --   --   --   HGB 8.8* 8.9* 8.5* 8.4* 8.5* 8.2*  HCT 26.2* 26.6*  25.7* 25.4* 25.4* 25.1*  MCV 93.9 94.3 96.3 94.4 95.1 95.8  PLT 330 347 446* 374 387 393   Blood Culture    Component Value Date/Time   SDES  12/13/2023 1411    BLOOD LEFT ANTECUBITAL Performed at [facility], [address], [place], [place] [address]    [facility]  12/13/2023 1411    BOTTLES DRAWN AEROBIC AND ANAEROBIC Blood Culture adequate volume Performed at [facility], [address] [place], [place] [address]    CULT  12/13/2023 1411    NO GROWTH 5 DAYS Performed at [facility], [address], [place], [place] [address]    REPTSTATUS 12/18/2023 FINAL 12/13/2023 1411    Cardiac Enzymes: No results for input(s): CKTOTAL, CKMB, CKMBINDEX, TROPONINI in the last 168 hours. CBG: No results for input(s): GLUCAP in the last 168 hours. Iron Studies: No results for input(s): IRON, TIBC, TRANSFERRIN, FERRITIN  in the last 72 hours. No results found for: INR, PROTIME Studies/Results: No results found.  Medications:  piperacillin -tazobactam (ZOSYN )  IV 2.25 g (12/18/23 0536)    acetaminophen   1,000 mg Oral Q6H   albuterol   3 mL Inhalation BID   atorvastatin   80 mg Oral q AM   febuxostat   40 mg Oral q AM   feeding supplement (NEPRO CARB STEADY)  237 mL Oral BID BM   heparin  injection (subcutaneous)  5,000 Units Subcutaneous Q8H   metoprolol  tartrate  25 mg Oral BID   pantoprazole   40 mg Oral Daily   rOPINIRole   0.5 mg Oral BID   sevelamer  carbonate  1,600 mg Oral TID WC    Dialysis Orders: [facility] TTS 3h   B350   68.7kg   2K bath  AVF   Heparin  none Last hd 9/02 post wt 69.9kg  Mircera 30 mcg q 4, last 8/26   Home bp meds: Metoprolol  xl 50 hs Lasix  80 mg non hd days  Assessment/Plan:  Perforated bowel due to incarcerated hernia: S/p segmental colon resection with creation of colostomy and incisional hernia repair with vicryl mesh on 9/4. On IV pip/tazo through 9/9. Per pmd/ surgery - wound vac  today and HH to be arranged but nearing d/c ESRD: on HD TTS. HD today BP: bp's accepatble, follow.  Atrial fib: getting metoprolol  25 bid  Volume: slightly dry on exam, dec UF goal today.  Anemia of esrd: Hb 8-10 here. Follow, transfuse prn.  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 No VDRA. Continue binders.  Will follow as she remains admitted.   Manuelita Barters MD [facility] Pager [phone]

## 2023-12-18 NOTE — Consult Note (Signed)
 WOC Consult to place new VAC to mid-abd wound by provider Cleansed wound with normal saline/gauze Applied cut hydrocolloid strip adjacent to ostomy appliance for better seal Filled wound with  01 piece of Mepitel, 0 piece of white foam, 01 piece of black foam into oddly shaped wound; 11x7x7 Sealed NPWT dressing at HG continuous  Patient received IV/PO pain medication per bedside nurse prior to dressing change Patient tolerated procedure well without pain medication, patient stated the dressing felt tight but upon leaving room she said that the feeling was going away. VAC unit was plugged in and functioning well- no alarms. Report given to nurse tech regarding emptying out ostomy appliance with 175 ml's effluent output. Ostomy appliance intact without any leaking   WOC to do VAC therapy on Tues and Fridays; okay with provider for twice a week.    WOC nurse will continue to provide NPWT dressing changed due to the complexity of the dressing change. Coordinated care with primary nurse(s), VAC placed once patient returned from HD.  Please reconsult if wound worsens in condition and notify provider. Nursing staff to follow [facility] policy and procedure.  Sherrilyn Hals MSN RN CWOCN WOC [facility]  [phone] (Available from 7-3 pm Mon-Friday)

## 2023-12-18 NOTE — Plan of Care (Signed)
  Problem: Health Behavior/Discharge Planning: Goal: Ability to manage health-related needs will improve Outcome: Progressing   Problem: Clinical Measurements: Goal: Will remain free from infection Outcome: Progressing   Problem: Elimination: Goal: Will not experience complications related to bowel motility Outcome: Progressing   Problem: Coping: Goal: Level of anxiety will decrease Outcome: Progressing

## 2023-12-18 NOTE — Progress Notes (Signed)
 Contacted by nephrologist due to pt requesting assistance with transportation to/from HD at d/c if possible. Contacted case management staff to make them aware of pt's request for information/assistance with transportation to/from HD at d/c. Navigator reached out to pt's HD clinic CSW to inquire if clinic CSW has ever completed any applications for transportation services should pt need them. Awaiting response from clinic CSW and will provide her response to case management staff once info received. Will assist as needed.   Randine Mungo Dialysis Navigator [phone]

## 2023-12-19 DIAGNOSIS — K631 Perforation of intestine (nontraumatic): Secondary | ICD-10-CM | POA: Diagnosis not present

## 2023-12-19 LAB — CBC
HCT: 25.5 % — ABNORMAL LOW (ref 36.0–46.0)
Hemoglobin: 8.4 g/dL — ABNORMAL LOW (ref 12.0–15.0)
MCH: 31.2 pg (ref 26.0–34.0)
MCHC: 32.9 g/dL (ref 30.0–36.0)
MCV: 94.8 fL (ref 80.0–100.0)
Platelets: 401 K/uL — ABNORMAL HIGH (ref 150–400)
RBC: 2.69 MIL/uL — ABNORMAL LOW (ref 3.87–5.11)
RDW: 15.3 % (ref 11.5–15.5)
WBC: 14.9 K/uL — ABNORMAL HIGH (ref 4.0–10.5)
nRBC: 0 % (ref 0.0–0.2)

## 2023-12-19 LAB — RENAL FUNCTION PANEL
Albumin: 1.9 g/dL — ABNORMAL LOW (ref 3.5–5.0)
Anion gap: 12 (ref 5–15)
BUN: 37 mg/dL — ABNORMAL HIGH (ref 8–23)
CO2: 26 mmol/L (ref 22–32)
Calcium: 8.7 mg/dL — ABNORMAL LOW (ref 8.9–10.3)
Chloride: 98 mmol/L (ref 98–111)
Creatinine, Ser: 5.28 mg/dL — ABNORMAL HIGH (ref 0.44–1.00)
GFR, Estimated: 8 mL/min — ABNORMAL LOW (ref >60)
Glucose, Bld: 90 mg/dL (ref 70–99)
Phosphorus: 4 mg/dL (ref 2.5–4.6)
Potassium: 3.6 mmol/L (ref 3.5–5.1)
Sodium: 136 mmol/L (ref 135–145)

## 2023-12-19 LAB — MAGNESIUM: Magnesium: 1.8 mg/dL (ref 1.7–2.4)

## 2023-12-19 MED ORDER — ALBUTEROL SULFATE HFA 108 (90 BASE) MCG/ACT IN AERS
1.0000 | INHALATION_SPRAY | Freq: Two times a day (BID) | RESPIRATORY_TRACT | Status: DC
  Administered 2023-12-19 – 2023-12-22 (×6): 2 via RESPIRATORY_TRACT
  Administered 2023-12-22: 1 via RESPIRATORY_TRACT
  Administered 2023-12-23 (×2): 2 via RESPIRATORY_TRACT
  Filled 2023-12-19: qty 6.7

## 2023-12-19 NOTE — Progress Notes (Addendum)
 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 General surgery was consulted.  Patient underwent exploratory laparotomy with colon resection and colostomy with mesh and ventral hernia.  Subjective / 24h Interval events: She is doing well today.  Denies any significant abdominal pain, nausea or vomiting.  Assesement and Plan: Principal problem Ventral incisional hernia with skin necrosis secondary to bowel perforation -General Surgery consulted and following.  She is status post exploratory laparotomy with colon resection and creation of colostomy as well as abdominal wall debridement with ventral incisional hernia repair. Has a wound VAC in place now -Management per surgery  - Status post a course of Zosyn    Active problems ESRD on HD - Currently on HD TTS.  Nephrology consulted and following   History of SAH - Hospitalized on 8/28 and 8/30. Had a fall with SAH secondary to Eliquis . Currently holding anticoagulation.   Paroxysmal A-fib - On Toprol -XL.  50 mg daily.  Currently on Lopressor  25 mg twice daily. Eliquis  on hold after her recent [facility].   History of CAD - No active complaints.  Not on any antiplatelet medication. Monitor.   Scalp laceration - Suture placed on 8/28.    Restless leg syndrome - continue home Requip    GERD - Continue Protonix .   History of breast cancer - On anastrozole .   Chronic insomnia - Continue Ambien  nightly.   Scheduled Meds:  acetaminophen   1,000 mg Oral Q6H   albuterol   1-2 puff Inhalation Q12H   atorvastatin   80 mg Oral q AM   febuxostat   40 mg Oral q AM   feeding supplement (NEPRO CARB STEADY)  237 mL Oral BID BM   heparin  injection (subcutaneous)  5,000 Units Subcutaneous Q8H    metoprolol  tartrate  25 mg Oral BID   pantoprazole   40 mg Oral Daily   rOPINIRole   0.5 mg Oral BID   sevelamer  carbonate  1,600 mg Oral TID WC   Continuous Infusions: PRN Meds:.albuterol , HYDROmorphone  (DILAUDID ) injection, methocarbamol , ondansetron  (ZOFRAN ) IV, traMADol , zolpidem   Current Outpatient Medications  Medication Instructions   acetaminophen  (TYLENOL ) 500 mg, Oral, Every 6 hours PRN   anastrozole  (ARIMIDEX ) 1 mg, Oral, Daily   atorvastatin  (LIPITOR ) 80 mg, Oral, Every morning   budesonide  (RHINOCORT  AQUA) 32 MCG/ACT nasal spray 2 sprays, Each Nare, Every morning   Cholecalciferol 2,000 Units, Oral, Every morning   diclofenac  Sodium (VOLTAREN ) 1 % GEL 1 Application, Topical, 4 times daily PRN   docusate sodium  (COLACE) 100 mg, Oral, Daily PRN   doxycycline  (VIBRA -TABS) 100 mg, Oral, 2 times daily   eszopiclone (LUNESTA) 2 mg, Oral, Daily at bedtime   febuxostat  (ULORIC ) 40 mg, Oral, Every morning   furosemide  (LASIX ) 80 mg, Oral, See admin instructions, Take 1 tablet by mouth on non-dialysis days, Mon, Wed, Fri, Sun   loratadine  (CLARITIN ) 10 mg, Oral, 2 times daily   Methoxy PEG-Epoetin  Beta (MIRCERA IJ) 60 mcg   metoprolol  succinate (TOPROL -XL) 100 MG 24 hr tablet Take 0.5-1 tablet (50-100 mg total) by mouth daily as needed for elevated heart rate. Take with or immediately following a meal.   Multiple Vitamins-Minerals (PRESERVISION AREDS 2+MULTI VIT PO) 1 capsule, 2 times daily   ondansetron  (ZOFRAN ) 4 mg, See admin  instructions   pantoprazole  (PROTONIX ) 40 mg, Oral, Daily   PROAIR  HFA 108 (90 BASE) MCG/ACT inhaler 1-2 puffs, Every 6 hours PRN   rOPINIRole  (REQUIP ) 0.5 mg, Oral, Every 12 hours PRN   sevelamer  carbonate (RENVELA ) 1,600 mg, 3 times daily with meals    Diet Orders (From admission, onward)     Start     Ordered   12/16/23 0802  DIET SOFT Room service appropriate? Yes; Fluid consistency: Thin  Diet effective now       Question Answer Comment  Room  service appropriate? Yes   Fluid consistency: Thin      12/16/23 0803            DVT prophylaxis: heparin  injection 5,000 Units Start: 12/14/23 1400 SCDs Start: 12/14/23 9663   Lab Results  Component Value Date   PLT 401 (H) 12/19/2023      Code Status: Full Code  Family Communication: no family at bedside   Status is: Inpatient Remains inpatient appropriate because: severity of illness  Level of care: Telemetry Surgical  Consultants:  General surgery   Objective: Vitals:   12/18/23 1752 12/18/23 2120 12/19/23 0531 12/19/23 0755  BP: (!) 151/62 (!) 138/58 (!) 140/64 (!) 141/60  Pulse: 86 85 80 85  Resp: 18 17 17 16   Temp: (!) 97.5 F (36.4 C) 98.3 F (36.8 C) 98.1 F (36.7 C) 98.7 F (37.1 C)  TempSrc:  Oral Oral Oral  SpO2: 97% 95% 96% 100%  Weight:      Height:        Intake/Output Summary (Last 24 hours) at 12/19/2023 1100 Last data filed at 12/18/2023 2231 Gross per 24 hour  Intake 1179.99 ml  Output 500 ml  Net 679.99 ml   Wt Readings from Last 3 Encounters:  12/18/23 73.1 kg  12/07/23 68 kg  11/29/23 68 kg    Examination:  Constitutional: NAD Eyes: no scleral icterus ENMT: Mucous membranes are moist.  Neck: normal, supple Respiratory: clear to auscultation bilaterally, no wheezing, no crackles. Cardiovascular: Regular rate and rhythm, no murmurs / rubs / gallops. Abdomen: non distended, no tenderness. Bowel sounds positive.  Musculoskeletal: no clubbing / cyanosis.    Data Reviewed: I have independently reviewed following labs and imaging studies   CBC Recent Labs  Lab 12/13/23 1358 12/14/23 0652 12/15/23 0830 12/16/23 0710 12/17/23 0326 12/18/23 0250 12/19/23 0313  WBC 17.2*   < > 19.6* 12.5* 13.0* 16.6* 14.9*  HGB 8.8*   < > 8.5* 8.4* 8.5* 8.2* 8.4*  HCT 26.2*   < > 25.7* 25.4* 25.4* 25.1* 25.5*  PLT 330   < > 446* 374 387 393 401*  MCV 93.9   < > 96.3 94.4 95.1 95.8 94.8  MCH 31.5   < > 31.8 31.2 31.8 31.3 31.2  MCHC  33.6   < > 33.1 33.1 33.5 32.7 32.9  RDW 15.7*   < > 15.6* 15.5 15.4 15.3 15.3  LYMPHSABS 0.8  --   --   --   --   --   --   MONOABS 1.3*  --   --   --   --   --   --   EOSABS 0.1  --   --   --   --   --   --   BASOSABS 0.0  --   --   --   --   --   --    < > = values in this interval not displayed.  Recent Labs  Lab 12/13/23 1358 12/14/23 9347 12/15/23 0834 12/16/23 0710 12/17/23 0326 12/18/23 0250 12/19/23 0313  NA 137 139 133* 135 137 138 136  K 3.4* 4.3 4.5 4.2 3.9 4.4 3.6  CL 96* 99 95* 95* 97* 96* 98  CO2 24 23 19* 23 26 21* 26  GLUCOSE 105* 145* 103* 87 95 67* 90  BUN 57* 67* 87* 52* 66* 76* 37*  CREATININE 7.15* 7.91* 8.76* 6.04* 7.49* 8.65* 5.28*  CALCIUM  9.6 9.1 8.5* 8.7* 8.5* 8.8* 8.7*  AST 13*  --   --   --   --   --   --   ALT 11  --   --   --   --   --   --   ALKPHOS 132*  --   --   --   --   --   --   BILITOT 0.3  --   --   --   --   --   --   ALBUMIN  3.2*  --  2.3* 2.2* 2.1* 2.0* 1.9*  MG  --  1.8  --  2.0 2.0 2.0 1.8  LATICACIDVEN 0.9  --   --   --   --   --   --     ------------------------------------------------------------------------------------------------------------------ No results for input(s): CHOL, HDL, LDLCALC, TRIG, CHOLHDL, LDLDIRECT in the last 72 hours.  Lab Results  Component Value Date   HGBA1C 5.2 04/17/2023   ------------------------------------------------------------------------------------------------------------------ No results for input(s): TSH, T4TOTAL, T3FREE, THYROIDAB in the last 72 hours.  Invalid input(s): FREET3  Cardiac Enzymes No results for input(s): CKMB, TROPONINI, MYOGLOBIN in the last 168 hours.  Invalid input(s): CK ------------------------------------------------------------------------------------------------------------------    Component Value Date/Time   BNP 1,710.2 (H) 10/23/2023 0222    CBG: No results for input(s): GLUCAP in the last 168 hours.  Recent  Results (from the past 240 hours)  Culture, blood (routine x 2)     Status: None   Collection Time: 12/13/23  2:05 PM   Specimen: BLOOD  Result Value Ref Range Status   Specimen Description   Final    BLOOD BLOOD LEFT HAND Performed at [facility], [address], [place], [place] [address]    Special Requests   Final    BOTTLES DRAWN AEROBIC AND ANAEROBIC Blood Culture adequate volume Performed at [facility], [address], [place], [place] [address]    Culture   Final    NO GROWTH 5 DAYS Performed at [facility], [address], [place], [place] [address]    Report Status 12/18/2023 FINAL  Final  Culture, blood (routine x 2)     Status: None   Collection Time: 12/13/23  2:11 PM   Specimen: BLOOD  Result Value Ref Range Status   Specimen Description   Final    BLOOD LEFT ANTECUBITAL Performed at [facility], [address], [place], [place] [address]    Special Requests   Final    BOTTLES DRAWN AEROBIC AND ANAEROBIC Blood Culture adequate volume Performed at [facility], [address], [place], [place] [address]    Culture   Final    NO GROWTH 5 DAYS Performed at [facility], [address], [place], [place] [address]    Report Status 12/18/2023 FINAL  Final  Surgical pcr screen     Status: None   Collection Time: 12/13/23  5:35 PM   Specimen: Nasal Mucosa; Nasal Swab  Result  Value Ref Range Status   MRSA, PCR NEGATIVE NEGATIVE Final   Staphylococcus aureus NEGATIVE NEGATIVE Final    Comment: (NOTE) The Xpert SA Assay (FDA approved for NASAL specimens in patients 32 years of age and older), is one component of a comprehensive surveillance program. It is not intended to diagnose infection nor to guide or monitor treatment. Performed at [facility], [address], [place], [place] [address]      Radiology Studies: No results found.   Nilda Fendt, MD,  PhD [facility]  Between 7 am - 7 pm I am available, please contact me via Amion (for emergencies) or Securechat (non urgent messages)  Between 7 pm - 7 am I am not available, please contact night coverage MD/APP via Amion

## 2023-12-19 NOTE — TOC Progression Note (Signed)
 Transition of Care (TOC) - Progression Note   Home VAC is at bedside Patient Details  Name: JANAZIA SCHREIER MRN: [id number] Date of Birth: December 04, 1945  Transition of Care [facility]) CM/SW Contact  Kemaya Dorner, Powell Jansky, RN Phone Number: 12/19/2023, 12:33 PM  Clinical Narrative:       Expected Discharge Plan: Home w Home Health Services Barriers to Discharge: Continued Medical Work up               Expected Discharge Plan and Services   Discharge Planning Services: CM Consult Post Acute Care Choice: Home Health Living arrangements for the past 2 months: Single Family Home                 DME Arranged: N/A         HH Arranged:  (see note)           Social Drivers of Health (SDOH) Interventions SDOH Screenings   Food Insecurity: No Food Insecurity (12/13/2023)  Housing: Low Risk  (12/13/2023)  Transportation Needs: No Transportation Needs (12/13/2023)  Utilities: Not At Risk (12/13/2023)  Depression (PHQ2-9): Low Risk  (02/28/2022)  Financial Resource Strain: Low Risk  (09/02/2021)  Social Connections: Moderately Integrated (12/13/2023)  Stress: No Stress Concern Present (09/02/2021)  Tobacco Use: Medium Risk (12/13/2023)    Readmission Risk Interventions     No data to display

## 2023-12-19 NOTE — Progress Notes (Signed)
 Progress Note  6 Days Post-Op  Subjective: Patient's pain is minimal and well controlled. Tolerating soft diet without nausea and vomiting. Having brown, thin stool output into ostomy pouch along with flatulence.   ROS  All negative with the exception of above.  Objective: Vital signs in last 24 hours: Temp:  [97.1 F (36.2 C)-98.7 F (37.1 C)] 98.7 F (37.1 C) (09/10 0755) Pulse Rate:  [64-96] 85 (09/10 0755) Resp:  [11-20] 16 (09/10 0755) BP: (105-172)/(51-96) 141/60 (09/10 0755) SpO2:  [94 %-100 %] 100 % (09/10 0755) Weight:  [73.1 kg] 73.1 kg (09/09 0846) Last BM Date : 12/18/23  Intake/Output from previous day: 09/09 0701 - 09/10 0700 In: 1180 [P.O.:480; IV Piggyback:700] Out: 500 [Stool:300] Intake/Output this shift: No intake/output data recorded.  PE: General: Pleasant female who is laying in bed in NAD. Lungs: Respiratory effort nonlabored Abd: Soft, NT, ND. Ostomy noted of right abdomen. Stoma viable. Brown thin stool and gas noted in the ostomy pouch. Midline incision covered with wound vac dressing. Skin: Warm and dry with no masses, lesions, or rashes Psych: A&Ox3 with an appropriate affect.    Lab Results:  Recent Labs    12/18/23 0250 12/19/23 0313  WBC 16.6* 14.9*  HGB 8.2* 8.4*  HCT 25.1* 25.5*  PLT 393 401*   BMET Recent Labs    12/18/23 0250 12/19/23 0313  NA 138 136  K 4.4 3.6  CL 96* 98  CO2 21* 26  GLUCOSE 67* 90  BUN 76* 37*  CREATININE 8.65* 5.28*  CALCIUM  8.8* 8.7*   PT/INR No results for input(s): LABPROT, INR in the last 72 hours. CMP     Component Value Date/Time   NA 136 12/19/2023 0313   NA 141 05/10/2023 1314   K 3.6 12/19/2023 0313   CL 98 12/19/2023 0313   CO2 26 12/19/2023 0313   GLUCOSE 90 12/19/2023 0313   BUN 37 (H) 12/19/2023 0313   BUN 40 (H) 05/10/2023 1314   CREATININE 5.28 (H) 12/19/2023 0313   CREATININE 5.78 (H) 11/19/2023 1140   CALCIUM  8.7 (L) 12/19/2023 0313   PROT 5.9 (L) 12/13/2023  1358   PROT 5.5 (L) 05/10/2023 1314   ALBUMIN  1.9 (L) 12/19/2023 0313   ALBUMIN  3.4 (L) 05/10/2023 1314   AST 13 (L) 12/13/2023 1358   AST 14 (L) 11/19/2023 1140   ALT 11 12/13/2023 1358   ALT 11 11/19/2023 1140   ALKPHOS 132 (H) 12/13/2023 1358   BILITOT 0.3 12/13/2023 1358   BILITOT 0.4 11/19/2023 1140   GFRNONAA 8 (L) 12/19/2023 0313   GFRNONAA 7 (L) 11/19/2023 1140   Lipase  No results found for: LIPASE     Studies/Results: No results found.  Anti-infectives: Anti-infectives (From admission, onward)    Start     Dose/Rate Route Frequency Ordered Stop   12/13/23 2200  piperacillin -tazobactam (ZOSYN ) IVPB 2.25 g        2.25 g 100 mL/hr over 30 Minutes Intravenous Every 8 hours 12/13/23 2113 12/18/23 2330   12/13/23 2111  piperacillin -tazobactam (ZOSYN ) 3.375 GM/50ML IVPB       Note to Pharmacy: Golob, Jamie C: cabinet override      12/13/23 2111 12/14/23 0914   12/13/23 1415  piperacillin -tazobactam (ZOSYN ) IVPB 3.375 g        3.375 g 100 mL/hr over 30 Minutes Intravenous  Once 12/13/23 1413 12/13/23 1653        Assessment/Plan POD6: S/p ex lap with segmental TC resection with end colostomy,  abdominal wall debridement, and ventral incisional hernia repair with vicryl mesh, Dr. Dasie 12/13/2023 for transverse colon perforation within an incarcerated ventral hernia. -Afebrile. Hypertensive. Hemodynamically stable. -Labs pending for today. -Completed 5 days of Zosyn . Ended 12/18/23. -Tolerating soft diet without n/v. Having ostomy output with flatus. -Pain manageable. Continue multi-modal pain control. -Continue to mobilize as tolerated with PT; PT recommending home health PT. -IS for pulm toilet -Wound vac placed 12/18/2023. Scheduled for Tuesday/Friday changes.  -Continue abdominal binder   FEN: Soft; feeding supplement; IVF per primary team VTE: Scds; Heparin  injections ID: Course of Zosyn  completed 9/9.    LOS: 6 days   I reviewed specialist notes,  hospitalist notes, last 24 h vitals and pain scores, last 48 h intake and output, last 24 h labs and trends, and last 24 h imaging results.   Marjorie Carlyon Favre, [facility] 12/19/2023, 8:23 AM Please see Amion for pager number during day hours 7:00am-4:30pm

## 2023-12-19 NOTE — Consult Note (Signed)
 WOC Nurse ostomy consult note  Noted WOC consult for ostomy teaching.  WOC team is following patient for ostomy teaching and NPWT with plans to see again on Friday 9/12.  Thank you,  Doyal Polite, RN, MSN, [facility] WOC Team [phone] (Available Mon-Fri 0700-1500)

## 2023-12-19 NOTE — Progress Notes (Signed)
 Physical Therapy Treatment Patient Details Name: Rachel Villarreal MRN: [id number] DOB: 09/22/1945 Today's Date: 12/19/2023   History of Present Illness 78 y.o. female admitted on 12/13/23 s/p ex lap with segmental TC resection with end colostomy, abdominal wall debridement, and ventral incisional hernia repair with vicryl mesh, Dr. Dasie 12/13/2023 for transverse colon perforation within an incarcerated ventral hernia  PMH: HTN, ESRD on HD, breast cancer, CAD, HFimpEF, PAF on eliquis .    PT Comments  Pt resting in bed on arrival, pleasant and agreeable to session and demonstrating continued progress towards acute goals. Pt demonstrating bed mobility, transfers and gait with RW for support with grossly supervision for safety and line/vac management. Pt demonstrating increased activity tolerance, progressing gait distance without need for seated rest. Encouraged pt to sit up OOB in chair, however pt declining due to comfort, HOB elevated to 55 degrees. Pt was educated on continued walker use to maximize functional independence, safety, and decrease risk for falls as well as appropriate activity progression with pt verbalizing understanding. Pt continues to benefit from skilled PT services to progress toward functional mobility goals.      If plan is discharge home, recommend the following: Assist for transportation;A little help with walking and/or transfers;A little help with bathing/dressing/bathroom;Help with stairs or ramp for entrance;Assistance with cooking/housework   Can travel by private vehicle        Equipment Recommendations  None recommended by PT    Recommendations for Other Services       Precautions / Restrictions Precautions Precautions: Fall Recall of Precautions/Restrictions: Intact Precaution/Restrictions Comments: abdominal precautions Restrictions Weight Bearing Restrictions Per Provider Order: No     Mobility  Bed Mobility Overal bed mobility: Needs Assistance Bed  Mobility: Sit to Supine, Rolling, Sidelying to Sit Rolling: Supervision Sidelying to sit: Supervision   Sit to supine: Supervision   General bed mobility comments: supervision for safety no assist needed    Transfers Overall transfer level: Needs assistance Equipment used: Rolling walker (2 wheels) Transfers: Sit to/from Stand, Bed to chair/wheelchair/BSC Sit to Stand: Contact guard assist, Supervision           General transfer comment: good recall for hand placement, CGA initally fading to supervision, standing from EOB at lowest height and low commode    Ambulation/Gait Ambulation/Gait assistance: Supervision Gait Distance (Feet): 135 Feet Assistive device: Rolling walker (2 wheels) Gait Pattern/deviations: Step-through pattern, Decreased stride length Gait velocity: decr     General Gait Details: supervision for safety, no overt LOB, mild L drift in hall with pt stating that it feels like RW is pulling her to the L   Stairs             Wheelchair Mobility     Tilt Bed    Modified Rankin (Stroke Patients Only)       Balance Overall balance assessment: Mild deficits observed, not formally tested                                          Communication Communication Communication: No apparent difficulties  Cognition Arousal: Alert Behavior During Therapy: WFL for tasks assessed/performed   PT - Cognitive impairments: No apparent impairments                         Following commands: Intact      Cueing Cueing Techniques: Verbal cues, Tactile  cues  Exercises      General Comments General comments (skin integrity, edema, etc.): VSS on RA      Pertinent Vitals/Pain Pain Assessment Pain Assessment: No/denies pain    Home Living                          Prior Function            PT Goals (current goals can now be found in the care plan section) Acute Rehab PT Goals Patient Stated Goal: Patient's  goal is to return home. PT Goal Formulation: With patient Time For Goal Achievement: 12/29/23 Progress towards PT goals: Progressing toward goals    Frequency    Min 2X/week      PT Plan      Co-evaluation              AM-PAC PT 6 Clicks Mobility   Outcome Measure  Help needed turning from your back to your side while in a flat bed without using bedrails?: A Little Help needed moving from lying on your back to sitting on the side of a flat bed without using bedrails?: A Little Help needed moving to and from a bed to a chair (including a wheelchair)?: A Little Help needed standing up from a chair using your arms (e.g., wheelchair or bedside chair)?: A Little Help needed to walk in hospital room?: A Little Help needed climbing 3-5 steps with a railing? : A Lot 6 Click Score: 17    End of Session   Activity Tolerance: Patient tolerated treatment well Patient left: with call bell/phone within reach;in bed;Other (comment) (with MD in room) Nurse Communication: Mobility status PT Visit Diagnosis: Other abnormalities of gait and mobility (R26.89);Unsteadiness on feet (R26.81)     Time: 9163-9145 PT Time Calculation (min) (ACUTE ONLY): 18 min  Charges:    $Gait Training: 8-22 mins PT General Charges $$ ACUTE PT VISIT: 1 Visit                     Keene Gilkey R. PTA Acute Rehabilitation Services Office: [phone]   [name] 12/19/2023, 8:57 AM

## 2023-12-19 NOTE — TOC Progression Note (Addendum)
 Transition of Care [facility]) - Progression Note    Patient Details  Name: Rachel Villarreal MRN: [id number] Date of Birth: [birth year]  [facility]) CM/SW Contact  Shaden Lacher LITTIE Moose, [place] Phone Number: 12/19/2023, 11:01 AM  Clinical Narrative:    CSW completed and submitted application for [facility] [place] transportation.  11:51 AM- CSW spoke with pt and let her know that her [facility] [place] application had been submitted. CSW also informed pt that application will take 21 days to process and asked if she had anyone that could help get her to dialysis during the week until then. Pt stated she had a friend in [place] she could call or she could utilize [place] to get her there in the meantime.  Expected Discharge Plan: Home w Home Health Services Barriers to Discharge: Continued Medical Work up               Expected Discharge Plan and Services   Discharge Planning Services: CM Consult Post Acute Care Choice: Home Health Living arrangements for the past 2 months: Single Family Home                 DME Arranged: N/A         HH Arranged:  (see note)           Social Drivers of Health (SDOH) Interventions SDOH Screenings   Food Insecurity: No Food Insecurity (12/13/2023)  Housing: Low Risk  (12/13/2023)  Transportation Needs: No Transportation Needs (12/13/2023)  Utilities: Not At Risk (12/13/2023)  Depression (PHQ2-9): Low Risk  (02/28/2022)  Financial Resource Strain: Low Risk  (09/02/2021)  Social Connections: Moderately Integrated (12/13/2023)  Stress: No Stress Concern Present (09/02/2021)  Tobacco Use: Medium Risk (12/13/2023)    Readmission Risk Interventions     No data to display

## 2023-12-19 NOTE — Progress Notes (Signed)
 Mobility Specialist Progress Note:    12/19/23 1220  Mobility  Activity Ambulated with assistance (In hallway)  Level of Assistance Standby assist, set-up cues, supervision of patient - no hands on  Assistive Device Front wheel walker  Distance Ambulated (ft) 115 ft  Activity Response Tolerated well  Mobility Referral Yes  Mobility visit 1 Mobility  Mobility Specialist Start Time (ACUTE ONLY) 1202  Mobility Specialist Stop Time (ACUTE ONLY) 1219  Mobility Specialist Time Calculation (min) (ACUTE ONLY) 17 min   Received pt in bed and agreeable to mobility. No physical assistance needed. No c/o. Returned to room without fault. Left pt in bed with alarm on. Personal belongings and call light within reach. All needs met.  Lavanda Pollack Mobility Specialist  Please contact via [employer] or  Rehab Office [phone]

## 2023-12-19 NOTE — Plan of Care (Signed)

## 2023-12-19 NOTE — Progress Notes (Signed)
 [facility] Progress Note   Subjective:   Seen and examined patient at bedside.  No new issues.  Signed off HD a few min early yesterday due to fatigue and hungry, tolerated well   Objective Vitals:   12/18/23 1752 12/18/23 2120 12/19/23 0531 12/19/23 0755  BP: (!) 151/62 (!) 138/58 (!) 140/64 (!) 141/60  Pulse: 86 85 80 85  Resp: 18 17 17 16   Temp: (!) 97.5 F (36.4 C) 98.3 F (36.8 C) 98.1 F (36.7 C) 98.7 F (37.1 C)  TempSrc:  Oral Oral Oral  SpO2: 97% 95% 96% 100%  Weight:      Height:       Physical Exam General: Awake, alert, on RA, NAD ENT: dry MM Heart: S1 and S2; No wheezing, gallops, or rubs Lungs: Clear anteriorly Extremities: No LE edema Dialysis Access: R AVF +t/b  Filed Weights   12/15/23 0809 12/15/23 1120 12/18/23 0846  Weight: 75.4 kg 74.4 kg 73.1 kg    Intake/Output Summary (Last 24 hours) at 12/19/2023 0900 Last data filed at 12/18/2023 2231 Gross per 24 hour  Intake 1179.99 ml  Output 500 ml  Net 679.99 ml     Additional Objective Labs: Basic Metabolic Panel: Recent Labs  Lab 12/17/23 0326 12/18/23 0250 12/19/23 0313  NA 137 138 136  K 3.9 4.4 3.6  CL 97* 96* 98  CO2 26 21* 26  GLUCOSE 95 67* 90  BUN 66* 76* 37*  CREATININE 7.49* 8.65* 5.28*  CALCIUM  8.5* 8.8* 8.7*  PHOS 5.2* 5.1* 4.0   Liver Function Tests: Recent Labs  Lab 12/13/23 1358 12/15/23 0834 12/17/23 0326 12/18/23 0250 12/19/23 0313  AST 13*  --   --   --   --   ALT 11  --   --   --   --   ALKPHOS 132*  --   --   --   --   BILITOT 0.3  --   --   --   --   PROT 5.9*  --   --   --   --   ALBUMIN  3.2*   < > 2.1* 2.0* 1.9*   < > = values in this interval not displayed.   No results for input(s): LIPASE, AMYLASE in the last 168 hours. CBC: Recent Labs  Lab 12/13/23 1358 12/14/23 0652 12/15/23 0830 12/16/23 0710 12/17/23 0326 12/18/23 0250 12/19/23 0313  WBC 17.2*   < > 19.6* 12.5* 13.0* 16.6* 14.9*  NEUTROABS 14.8*  --   --   --   --    --   --   HGB 8.8*   < > 8.5* 8.4* 8.5* 8.2* 8.4*  HCT 26.2*   < > 25.7* 25.4* 25.4* 25.1* 25.5*  MCV 93.9   < > 96.3 94.4 95.1 95.8 94.8  PLT 330   < > 446* 374 387 393 401*   < > = values in this interval not displayed.   Blood Culture    Component Value Date/Time   SDES  12/13/2023 1411    BLOOD LEFT ANTECUBITAL Performed at [facility], [address], [place], [place] [address]    [facility]  12/13/2023 1411    BOTTLES DRAWN AEROBIC AND ANAEROBIC Blood Culture adequate volume Performed at [facility], [address], [place], [place] [address]    CULT  12/13/2023 1411    NO GROWTH 5 DAYS Performed at [facility], [address], [place], [place] [address]  REPTSTATUS 12/18/2023 FINAL 12/13/2023 1411    Cardiac Enzymes: No results for input(s): CKTOTAL, CKMB, CKMBINDEX, TROPONINI in the last 168 hours. CBG: No results for input(s): GLUCAP in the last 168 hours. Iron Studies: No results for input(s): IRON, TIBC, TRANSFERRIN, FERRITIN in the last 72 hours. No results found for: INR, PROTIME Studies/Results: No results found.  Medications:    acetaminophen   1,000 mg Oral Q6H   albuterol   1-2 puff Inhalation Q12H   atorvastatin   80 mg Oral q AM   febuxostat   40 mg Oral q AM   feeding supplement (NEPRO CARB STEADY)  237 mL Oral BID BM   heparin  injection (subcutaneous)  5,000 Units Subcutaneous Q8H   metoprolol  tartrate  25 mg Oral BID   pantoprazole   40 mg Oral Daily   rOPINIRole   0.5 mg Oral BID   sevelamer  carbonate  1,600 mg Oral TID WC    Dialysis Orders: [facility] 3h   B350   68.7kg   2K bath  AVF   Heparin  none Last hd 9/02 post wt 69.9kg  Mircera 30 mcg q 4, last 8/26   Home bp meds: Metoprolol  xl 50 hs Lasix  80 mg non hd days  Assessment/Plan:  Perforated bowel due to incarcerated hernia: S/p segmental colon resection with creation of colostomy and incisional hernia repair  with vicryl mesh on 9/4. Pip/tazo completed 9/9. Per pmd/ surgery - HH to be arranged but nearing d/c ESRD: on HD TTS. HD tomorrow BP: bp's acceptable, follow.  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 Follow, transfuse prn.  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 No VDRA. Continue binders.  SW helping arrange transportation to/from HD as she doesn't think she'll be able to drive - [facility] [place] Will follow as she remains admitted.   Manuelita Barters MD [facility] Pager [phone]

## 2023-12-19 NOTE — Progress Notes (Signed)
 Contacted by team to be advised that pt is for possible d/c tomorrow after HD, wound vac change, and ostomy education. Contacted [facility] to be made aware of this information and that pt will likely resume HD on Saturday. Clinic staff also advised that hospital CSW has completed and submitted application to [facility] for review so clinic staff can assist with f/u if needed. Update provided to renal NP as well. Team requesting pt receive HD 1st shift tomorrow if possible and made nephrologist aware. Navigator contacted inpt HD unit staff to be made aware of this request as well and reasons. Will assist as needed.   Randine Mungo Dialysis Navigator [phone]

## 2023-12-20 DIAGNOSIS — K631 Perforation of intestine (nontraumatic): Secondary | ICD-10-CM | POA: Diagnosis not present

## 2023-12-20 LAB — COMPREHENSIVE METABOLIC PANEL WITH GFR
ALT: 15 U/L (ref 0–44)
AST: 18 U/L (ref 15–41)
Albumin: 2 g/dL — ABNORMAL LOW (ref 3.5–5.0)
Alkaline Phosphatase: 136 U/L — ABNORMAL HIGH (ref 38–126)
Anion gap: 14 (ref 5–15)
BUN: 52 mg/dL — ABNORMAL HIGH (ref 8–23)
CO2: 25 mmol/L (ref 22–32)
Calcium: 8.9 mg/dL (ref 8.9–10.3)
Chloride: 98 mmol/L (ref 98–111)
Creatinine, Ser: 6.8 mg/dL — ABNORMAL HIGH (ref 0.44–1.00)
GFR, Estimated: 6 mL/min — ABNORMAL LOW (ref >60)
Glucose, Bld: 91 mg/dL (ref 70–99)
Potassium: 3.8 mmol/L (ref 3.5–5.1)
Sodium: 137 mmol/L (ref 135–145)
Total Bilirubin: 0.7 mg/dL (ref 0.0–1.2)
Total Protein: 5.3 g/dL — ABNORMAL LOW (ref 6.5–8.1)

## 2023-12-20 LAB — CBC
HCT: 25.5 % — ABNORMAL LOW (ref 36.0–46.0)
Hemoglobin: 8.5 g/dL — ABNORMAL LOW (ref 12.0–15.0)
MCH: 31.6 pg (ref 26.0–34.0)
MCHC: 33.3 g/dL (ref 30.0–36.0)
MCV: 94.8 fL (ref 80.0–100.0)
Platelets: 430 K/uL — ABNORMAL HIGH (ref 150–400)
RBC: 2.69 MIL/uL — ABNORMAL LOW (ref 3.87–5.11)
RDW: 15.4 % (ref 11.5–15.5)
WBC: 13.2 K/uL — ABNORMAL HIGH (ref 4.0–10.5)
nRBC: 0 % (ref 0.0–0.2)

## 2023-12-20 LAB — MAGNESIUM: Magnesium: 1.9 mg/dL (ref 1.7–2.4)

## 2023-12-20 LAB — PHOSPHORUS: Phosphorus: 5.3 mg/dL — ABNORMAL HIGH (ref 2.5–4.6)

## 2023-12-20 MED ORDER — ALTEPLASE 2 MG IJ SOLR: 2.0000 mg | Freq: Once | INTRAMUSCULAR | Status: DC | PRN

## 2023-12-20 MED ORDER — PENTAFLUOROPROP-TETRAFLUOROETH EX AERO: 1.0000 | INHALATION_SPRAY | CUTANEOUS | Status: DC | PRN

## 2023-12-20 MED ORDER — LIDOCAINE-PRILOCAINE 2.5-2.5 % EX CREA: 1.0000 | TOPICAL_CREAM | CUTANEOUS | Status: DC | PRN

## 2023-12-20 MED ORDER — HEPARIN SODIUM (PORCINE) 1000 UNIT/ML DIALYSIS: 1000.0000 [IU] | INTRAMUSCULAR | Status: DC | PRN

## 2023-12-20 MED ORDER — NEPRO/CARBSTEADY PO LIQD
237.0000 mL | ORAL | Status: DC | PRN
Start: 2023-12-20 — End: 2023-12-20

## 2023-12-20 MED ORDER — ALTEPLASE 2 MG IJ SOLR
2.0000 mg | Freq: Once | INTRAMUSCULAR | Status: DC | PRN
Start: 2023-12-20 — End: 2023-12-20

## 2023-12-20 MED ORDER — LIDOCAINE HCL (PF) 1 % IJ SOLN
5.0000 mL | INTRAMUSCULAR | Status: DC | PRN
Start: 2023-12-20 — End: 2023-12-20

## 2023-12-20 MED ORDER — ANTICOAGULANT SODIUM CITRATE 4% (200MG/5ML) IV SOLN
5.0000 mL | Status: DC | PRN
Start: 2023-12-20 — End: 2023-12-20

## 2023-12-20 MED ORDER — LIDOCAINE HCL (PF) 1 % IJ SOLN: 5.0000 mL | INTRAMUSCULAR | Status: DC | PRN

## 2023-12-20 NOTE — TOC Progression Note (Signed)
 Transition of Care (TOC) - Progression Note   Patient has concerns about going home  with home health at discharge. Patient asking about rehab. PT recommendations are for HHPT .   Patient states she has steps inside and has not worked with PT on steps.   There is a secure chat with attending, PT, NCM, SW and Cir .   PT will reassess tomorrow . Patient aware.   Case Management team will follow up tomorrow after PT session. Patient aware will need to submit to insurance if recommendation changes to SNF or CIR  Patient Details  Name: Rachel Villarreal MRN: [id number] Date of Birth: 10-14-1945  Transition of Care [facility]) CM/SW Contact  Preciliano Castell, Powell Jansky, RN Phone Number: 12/20/2023, 3:04 PM  Clinical Narrative:       Expected Discharge Plan: Home w Home Health Services Barriers to Discharge: Continued Medical Work up               Expected Discharge Plan and Services   Discharge Planning Services: CM Consult Post Acute Care Choice: Home Health Living arrangements for the past 2 months: Single Family Home                 DME Arranged: N/A         HH Arranged:  (see note)           Social Drivers of Health (SDOH) Interventions SDOH Screenings   Food Insecurity: No Food Insecurity (12/13/2023)  Housing: Low Risk  (12/13/2023)  Transportation Needs: No Transportation Needs (12/13/2023)  Utilities: Not At Risk (12/13/2023)  Depression (PHQ2-9): Low Risk  (02/28/2022)  Financial Resource Strain: Low Risk  (09/02/2021)  Social Connections: Moderately Integrated (12/13/2023)  Stress: No Stress Concern Present (09/02/2021)  Tobacco Use: Medium Risk (12/13/2023)    Readmission Risk Interventions     No data to display

## 2023-12-20 NOTE — Consult Note (Addendum)
 WOC Nurse wound follow up Vac dressing changed.  Pt denies need for pain meds and tolerated with minimal amt discomfort.  Full thickness abd wound is beefy red with irregular shape, small amt pink drainage in the tubing.  Refer to previous notes for measurements and photos. Wound is in close proximity to the ostomy pouch, applied strip of barrier ring in between to attempt to maintain a seal.  Applied one piece Mepitel contact layer, then one piece black foam to cont suction. Pt will need Home Health assistance after discharge for Vac changes twice a week, and the ostomy pouch will need to be changed each time related to the close proximity to the dressing.  WOC team will plan to change again on Monday if patient is still in the hospital at that time.   WOC Nurse ostomy follow up Pt is alert and oriented during today's teaching session, previously she was groggy and did not participate.  She assisted with pouch change steps using a hand held mirror.  She states she has weakness in her hands related to previous medical conditions and I watched to be sure she could perform all steps with her limited manual dexterity.  Because of this, a one piece pouch will be more appropriate then a 2 piece.  Stoma is red and viable, 1 3/4 inches, slightly above skin level.  Pt was able to cleanse skin, stretch and apply a barrier ring, and apply one piece pouch that had already been cut out. She does not think she would be able to cut the opening but can have some friends precut the pouches for her eventually. She was able to remove tape boarder, then open and close the Velcro to empty.  Emptied 150cc brown liquid stool, discussed avoiding overfilling and when to call for assistance. Pt has been in dialysis most of the day.   Reviewed pouching routines. Pt feels that she will be able to empty the pouch, but is very concerned about her weakness and limited mobility to move around her house or into the bathroom to  empty the pouch.   Secure chat sent to the primary team and case manager as follows, I performed a Vac change and ostomy teaching this afternoon. This patient is concerned that she will not be able to ambulate or make it to the bathroom to empty her pouch upon discharge and would like to pursue a rehab facility instead of discharge home. She does not have anyone at home to assist her.   I discussed that we only have one size of pouch in the hospital, but I will call [facility] by [facility] and have some one piece precut sample pouches sent to her house. She has already been placed on [employer].  Discussed ordering supplies and 5 sets of supplies ordered to her room for staff nurses use: Use Supplies: barrier ring, Gerlean # [phone] and one piece pouch Gerlean # [phone]  Enrolled patient in [employer] Discharge program: Yes, previously I will call again and order 1 piece precut pouches  and barrier rings sent to her house.    WOC team will see again for another teaching session on Monday if she is still in the hospital at that time.   Thank-you,  Stephane Fought MSN, RN, CWOCN, CWCN-AP, CNS Contact Mon-Fri 0700-1500: [phone]

## 2023-12-20 NOTE — Progress Notes (Signed)
 Progress Note  7 Days Post-Op  Subjective: No significant pain or new concerns. Continuing to tolerate soft foods without nausea and vomiting. Brown thin stool output in ostomy pouch with flatulence.   ROS  All negative with the exception of above.  Objective: Vital signs in last 24 hours: Temp:  [98.3 F (36.8 C)-99 F (37.2 C)] 98.5 F (36.9 C) (09/11 0508) Pulse Rate:  [83-90] 83 (09/11 0508) Resp:  [16-18] 18 (09/11 0508) BP: (127-160)/(48-60) 147/60 (09/11 0508) SpO2:  [96 %-97 %] 97 % (09/11 0508) Weight:  [71.7 kg] 71.7 kg (09/11 0600) Last BM Date : 12/18/23  Intake/Output from previous day: 09/10 0701 - 09/11 0700 In: -  Out: 225 [Drains:25; Stool:200] Intake/Output this shift: No intake/output data recorded.  PE: General: Pleasant female who is laying in bed in NAD. Lungs: Respiratory effort nonlabored Abd: Soft, NT, ND. Ostomy noted of right abdomen. Stoma viable. Brown thin stool and gas noted in the ostomy pouch. Midline incision covered with wound vac dressing. Abdominal binder applied. Skin: Warm and dry with no masses, lesions, or rashes Psych: A&Ox3 with an appropriate affect.   Lab Results:  Recent Labs    12/19/23 0313 12/20/23 0421  WBC 14.9* 13.2*  HGB 8.4* 8.5*  HCT 25.5* 25.5*  PLT 401* 430*   BMET Recent Labs    12/19/23 0313 12/20/23 0421  NA 136 137  K 3.6 3.8  CL 98 98  CO2 26 25  GLUCOSE 90 91  BUN 37* 52*  CREATININE 5.28* 6.80*  CALCIUM  8.7* 8.9   PT/INR No results for input(s): LABPROT, INR in the last 72 hours. CMP     Component Value Date/Time   NA 137 12/20/2023 0421   NA 141 05/10/2023 1314   K 3.8 12/20/2023 0421   CL 98 12/20/2023 0421   CO2 25 12/20/2023 0421   GLUCOSE 91 12/20/2023 0421   BUN 52 (H) 12/20/2023 0421   BUN 40 (H) 05/10/2023 1314   CREATININE 6.80 (H) 12/20/2023 0421   CREATININE 5.78 (H) 11/19/2023 1140   CALCIUM  8.9 12/20/2023 0421   PROT 5.3 (L) 12/20/2023 0421   PROT 5.5 (L)  05/10/2023 1314   ALBUMIN  2.0 (L) 12/20/2023 0421   ALBUMIN  3.4 (L) 05/10/2023 1314   AST 18 12/20/2023 0421   AST 14 (L) 11/19/2023 1140   ALT 15 12/20/2023 0421   ALT 11 11/19/2023 1140   ALKPHOS 136 (H) 12/20/2023 0421   BILITOT 0.7 12/20/2023 0421   BILITOT 0.4 11/19/2023 1140   GFRNONAA 6 (L) 12/20/2023 0421   GFRNONAA 7 (L) 11/19/2023 1140   Lipase  No results found for: LIPASE     Studies/Results: No results found.  Anti-infectives: Anti-infectives (From admission, onward)    Start     Dose/Rate Route Frequency Ordered Stop   12/13/23 2200  piperacillin -tazobactam (ZOSYN ) IVPB 2.25 g        2.25 g 100 mL/hr over 30 Minutes Intravenous Every 8 hours 12/13/23 2113 12/19/23 0847   12/13/23 2111  piperacillin -tazobactam (ZOSYN ) 3.375 GM/50ML IVPB       Note to Pharmacy: Golob, Jamie C: cabinet override      12/13/23 2111 12/14/23 0914   12/13/23 1415  piperacillin -tazobactam (ZOSYN ) IVPB 3.375 g        3.375 g 100 mL/hr over 30 Minutes Intravenous  Once 12/13/23 1413 12/13/23 1653        Assessment/Plan POD7: S/p ex lap with segmental TC resection with end colostomy, abdominal wall  debridement, and ventral incisional hernia repair with vicryl mesh, Dr. Dasie 12/13/2023 for transverse colon perforation within an incarcerated ventral hernia. -Afebrile. Hypertensive. Hemodynamically stable. -WBC 13.2 from 14.9; HGB 8.5 from 8.4 -Completed 5 days of Zosyn . Ended 12/18/23. -Tolerating soft diet without n/v. Having ostomy output with flatus. -Pain manageable. -Continue to mobilize as tolerated with PT; PT recommending home health PT. Orders have been placed.  -IS for pulm toilet -Wound vac placed 12/18/2023. Scheduled for Tuesday/Friday changes. However, she will have ostomy education today and will have wound vac changed simultaneously due to close proximity.  -Continue abdominal binder   FEN: Soft; feeding supplement; IVF per primary team VTE: Scds; Heparin  injections;  alteplase ; anticoagulant sodium citrate  ID: Course of Zosyn  completed 9/9.    LOS: 7 days   I reviewed specialist notes, hospitalist notes, last 24 h vitals and pain scores, last 48 h intake and output, last 24 h labs and trends, and last 24 h imaging results.   Marjorie Carlyon Favre, [facility] 12/20/2023, 8:07 AM Please see Amion for pager number during day hours 7:00am-4:30pm

## 2023-12-20 NOTE — Plan of Care (Signed)
  Problem: Education: Goal: Knowledge of General Education information will improve Description: Including pain rating scale, medication(s)/side effects and non-pharmacologic comfort measures Outcome: Progressing   Problem: Activity: Goal: Risk for activity intolerance will decrease Outcome: Progressing   Problem: Nutrition: Goal: Adequate nutrition will be maintained Outcome: Progressing   Problem: Coping: Goal: Level of anxiety will decrease Outcome: Progressing   Problem: Elimination: Goal: Will not experience complications related to bowel motility Outcome: Progressing   Problem: Safety: Goal: Ability to remain free from injury will improve Outcome: Progressing   Problem: Skin Integrity: Goal: Will show signs of wound healing Outcome: Progressing

## 2023-12-20 NOTE — Plan of Care (Signed)
  Problem: Education: Goal: Knowledge of General Education information will improve Description: Including pain rating scale, medication(s)/side effects and non-pharmacologic comfort measures Outcome: Progressing   Problem: Nutrition: Goal: Adequate nutrition will be maintained Outcome: Progressing   Problem: Pain Managment: Goal: General experience of comfort will improve and/or be controlled Outcome: Progressing   Problem: Safety: Goal: Ability to remain free from injury will improve Outcome: Progressing   Problem: Skin Integrity: Goal: Risk for impaired skin integrity will decrease Outcome: Progressing

## 2023-12-20 NOTE — Progress Notes (Signed)
  Received patient in bed to unit.   Informed consent signed and in chart.    TX duration: 3.5     Transported by  Hand-off given to patient's nurse.    Access used: left AVF Access issues: None   Total UF removed: 2000 Medication(s) given: none Post HD VS: 130/60 Post HD weight:   70.3 kg   Hunter Hacking LPN [facility]

## 2023-12-20 NOTE — Progress Notes (Signed)
 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 General surgery was consulted.  Patient underwent exploratory laparotomy with colon resection and colostomy with mesh and ventral hernia.  Subjective / 24h Interval events: Seen in dialysis this morning.  Multiple concerns about her going home, including few steps that she has to navigate inside the home in between certain rooms, also that she needed supervision yesterday when walking and she is not can have that at home.  She is also worried that she is can have difficulties traveling to her dialysis given very steep driveway and unclarity if the transport pickup (either over versus [place] transportation) will go up the driveway  Assesement and Plan: Principal problem Ventral incisional hernia with skin necrosis secondary to bowel perforation -General Surgery consulted and following.  She is status post exploratory laparotomy with colon resection and creation of colostomy as well as abdominal wall debridement with ventral incisional hernia repair. Has a wound VAC in place now - Continue management per surgery, wound VAC to be changed tomorrow - Status post a course of Zosyn    Active problems ESRD on HD - Currently on HD TTS.  Nephrology consulted and following, HD today   History of SAH - Hospitalized on 8/28 and 8/30. Had a fall with SAH secondary to Eliquis . Currently holding anticoagulation.   Paroxysmal A-fib - On Toprol -XL.  50 mg daily.  Currently on Lopressor  25 mg twice daily. Eliquis  on hold after her recent [facility].   History of CAD - No active complaints.  Not on any antiplatelet medication. Monitor.   Scalp laceration - Suture placed on 8/28.  Removed today, is healing  well   Restless leg syndrome - continue home Requip    GERD - Continue Protonix .   History of breast cancer - On anastrozole .   Chronic insomnia - Continue Ambien  nightly.   Scheduled Meds:  acetaminophen   1,000 mg Oral Q6H   albuterol   1-2 puff Inhalation Q12H   atorvastatin   80 mg Oral q AM   febuxostat   40 mg Oral q AM   feeding supplement (NEPRO CARB STEADY)  237 mL Oral BID BM   heparin  injection (subcutaneous)  5,000 Units Subcutaneous Q8H   metoprolol  tartrate  25 mg Oral BID   pantoprazole   40 mg Oral Daily   rOPINIRole   0.5 mg Oral BID   sevelamer  carbonate  1,600 mg Oral TID WC   Continuous Infusions:  anticoagulant sodium citrate      PRN Meds:.albuterol , alteplase , anticoagulant sodium citrate , feeding supplement (NEPRO CARB STEADY), heparin , HYDROmorphone  (DILAUDID ) injection, lidocaine  (PF), lidocaine -prilocaine , methocarbamol , ondansetron  (ZOFRAN ) IV, pentafluoroprop-tetrafluoroeth, traMADol , zolpidem   Current Outpatient Medications  Medication Instructions   acetaminophen  (TYLENOL ) 500 mg, Oral, Every 6 hours PRN   anastrozole  (ARIMIDEX ) 1 mg, Oral, Daily   atorvastatin  (LIPITOR ) 80 mg, Oral, Every morning   budesonide  (RHINOCORT  AQUA) 32 MCG/ACT nasal spray 2 sprays, Each Nare, Every morning   Cholecalciferol 2,000 Units, Oral, Every morning   diclofenac  Sodium (VOLTAREN ) 1 % GEL 1 Application, Topical, 4 times daily PRN   docusate sodium  (COLACE) 100 mg, Oral, Daily PRN   doxycycline  (VIBRA -TABS) 100 mg, Oral, 2 times daily   eszopiclone (LUNESTA) 2 mg, Oral, Daily at bedtime   febuxostat  (  ULORIC ) 40 mg, Oral, Every morning   furosemide  (LASIX ) 80 mg, Oral, See admin instructions, Take 1 tablet by mouth on non-dialysis days, Mon, Wed, Fri, Sun   loratadine  (CLARITIN ) 10 mg, Oral, 2 times daily   Methoxy PEG-Epoetin  Beta (MIRCERA IJ) 60 mcg   metoprolol  succinate (TOPROL -XL) 100 MG 24 hr tablet Take 0.5-1 tablet (50-100 mg total) by mouth daily as needed for  elevated heart rate. Take with or immediately following a meal.   Multiple Vitamins-Minerals (PRESERVISION AREDS 2+MULTI VIT PO) 1 capsule, 2 times daily   ondansetron  (ZOFRAN ) 4 mg, See admin instructions   pantoprazole  (PROTONIX ) 40 mg, Oral, Daily   PROAIR  HFA 108 (90 BASE) MCG/ACT inhaler 1-2 puffs, Every 6 hours PRN   rOPINIRole  (REQUIP ) 0.5 mg, Oral, Every 12 hours PRN   sevelamer  carbonate (RENVELA ) 1,600 mg, 3 times daily with meals    Diet Orders (From admission, onward)     Start     Ordered   12/16/23 0802  DIET SOFT Room service appropriate? Yes; Fluid consistency: Thin  Diet effective now       Question Answer Comment  Room service appropriate? Yes   Fluid consistency: Thin      12/16/23 0803            DVT prophylaxis: heparin  injection 5,000 Units Start: 12/14/23 1400 SCDs Start: 12/14/23 9663   Lab Results  Component Value Date   PLT 430 (H) 12/20/2023      Code Status: Full Code  Family Communication: no family at bedside   Status is: Inpatient Remains inpatient appropriate because: severity of illness  Level of care: Telemetry Surgical  Consultants:  General surgery   Objective: Vitals:   12/20/23 0809 12/20/23 0821 12/20/23 0910 12/20/23 0925  BP: (!) 140/53 137/67 (!) 113/51 (!) 150/65  Pulse: 91 91 92 93  Resp: 13 13 16 18   Temp:      TempSrc:      SpO2: 98% 96% 100% 98%  Weight: 74.8 kg     Height:        Intake/Output Summary (Last 24 hours) at 12/20/2023 0944 Last data filed at 12/20/2023 0500 Gross per 24 hour  Intake --  Output 225 ml  Net -225 ml   Wt Readings from Last 3 Encounters:  12/20/23 74.8 kg  12/07/23 68 kg  11/29/23 68 kg    Examination:  Constitutional: NAD Eyes: lids and conjunctivae normal, no scleral icterus ENMT: mmm Neck: normal, supple Respiratory: clear to auscultation bilaterally, no wheezing, no crackles. Normal respiratory effort.  Cardiovascular: Regular rate and rhythm, no murmurs / rubs /  gallops. No LE edema. Abdomen: soft, no distention, no tenderness. Bowel sounds positive.    Data Reviewed: I have independently reviewed following labs and imaging studies   CBC Recent Labs  Lab 12/13/23 1358 12/14/23 0652 12/16/23 0710 12/17/23 0326 12/18/23 0250 12/19/23 0313 12/20/23 0421  WBC 17.2*   < > 12.5* 13.0* 16.6* 14.9* 13.2*  HGB 8.8*   < > 8.4* 8.5* 8.2* 8.4* 8.5*  HCT 26.2*   < > 25.4* 25.4* 25.1* 25.5* 25.5*  PLT 330   < > 374 387 393 401* 430*  MCV 93.9   < > 94.4 95.1 95.8 94.8 94.8  MCH 31.5   < > 31.2 31.8 31.3 31.2 31.6  MCHC 33.6   < > 33.1 33.5 32.7 32.9 33.3  RDW 15.7*   < > 15.5 15.4 15.3 15.3 15.4  LYMPHSABS 0.8  --   --   --   --   --   --  MONOABS 1.3*  --   --   --   --   --   --   EOSABS 0.1  --   --   --   --   --   --   BASOSABS 0.0  --   --   --   --   --   --    < > = values in this interval not displayed.    Recent Labs  Lab 12/13/23 1358 12/14/23 9347 12/16/23 0710 12/17/23 0326 12/18/23 0250 12/19/23 0313 12/20/23 0421  NA 137   < > 135 137 138 136 137  K 3.4*   < > 4.2 3.9 4.4 3.6 3.8  CL 96*   < > 95* 97* 96* 98 98  CO2 24   < > 23 26 21* 26 25  GLUCOSE 105*   < > 87 95 67* 90 91  BUN 57*   < > 52* 66* 76* 37* 52*  CREATININE 7.15*   < > 6.04* 7.49* 8.65* 5.28* 6.80*  CALCIUM  9.6   < > 8.7* 8.5* 8.8* 8.7* 8.9  AST 13*  --   --   --   --   --  18  ALT 11  --   --   --   --   --  15  ALKPHOS 132*  --   --   --   --   --  136*  BILITOT 0.3  --   --   --   --   --  0.7  ALBUMIN  3.2*   < > 2.2* 2.1* 2.0* 1.9* 2.0*  MG  --    < > 2.0 2.0 2.0 1.8 1.9  LATICACIDVEN 0.9  --   --   --   --   --   --    < > = values in this interval not displayed.    ------------------------------------------------------------------------------------------------------------------ No results for input(s): CHOL, HDL, LDLCALC, TRIG, CHOLHDL, LDLDIRECT in the last 72 hours.  Lab Results  Component Value Date   HGBA1C 5.2 04/17/2023    ------------------------------------------------------------------------------------------------------------------ No results for input(s): TSH, T4TOTAL, T3FREE, THYROIDAB in the last 72 hours.  Invalid input(s): FREET3  Cardiac Enzymes No results for input(s): CKMB, TROPONINI, MYOGLOBIN in the last 168 hours.  Invalid input(s): CK ------------------------------------------------------------------------------------------------------------------    Component Value Date/Time   BNP 1,710.2 (H) 10/23/2023 0222    CBG: No results for input(s): GLUCAP in the last 168 hours.  Recent Results (from the past 240 hours)  Culture, blood (routine x 2)     Status: None   Collection Time: 12/13/23  2:05 PM   Specimen: BLOOD  Result Value Ref Range Status   Specimen Description   Final    BLOOD BLOOD LEFT HAND Performed at [facility], [address] [place], [place] [address]    Special Requests   Final    BOTTLES DRAWN AEROBIC AND ANAEROBIC Blood Culture adequate volume Performed at [facility], [address] [place], [place] [address]    Culture   Final    NO GROWTH 5 DAYS Performed at [facility], [address], [place], [place] [address]    Report Status 12/18/2023 FINAL  Final  Culture, blood (routine x 2)     Status: None   Collection Time: 12/13/23  2:11 PM   Specimen: BLOOD  Result Value Ref Range Status   Specimen Description   Final    BLOOD LEFT ANTECUBITAL Performed at  [facility], [address], [place], [place] [address]    Special Requests   Final    BOTTLES DRAWN AEROBIC AND ANAEROBIC Blood Culture adequate volume Performed at [facility], [address], [place], [place] [address]    Culture   Final    NO GROWTH 5 DAYS Performed at [facility], [address], [place], [place] [address]    Report Status 12/18/2023 FINAL  Final  Surgical  pcr screen     Status: None   Collection Time: 12/13/23  5:35 PM   Specimen: Nasal Mucosa; Nasal Swab  Result Value Ref Range Status   MRSA, PCR NEGATIVE NEGATIVE Final   Staphylococcus aureus NEGATIVE NEGATIVE Final    Comment: (NOTE) The Xpert SA Assay (FDA approved for NASAL specimens in patients 25 years of age and older), is one component of a comprehensive surveillance program. It is not intended to diagnose infection nor to guide or monitor treatment. Performed at [facility], [address] [place], [place] [address]      Radiology Studies: No results found.   Nilda Fendt, MD, PhD [facility]  Between 7 am - 7 pm I am available, please contact me via Amion (for emergencies) or Securechat (non urgent messages)  Between 7 pm - 7 am I am not available, please contact night coverage MD/APP via Amion

## 2023-12-20 NOTE — Progress Notes (Signed)
 [facility] Progress Note   Subjective:   Seen and examined patient at bedside in dialysis this AM.  Tolerating fine.  Issues with transportation to dialysis being worked on.   Objective Vitals:   12/20/23 0600 12/20/23 0808 12/20/23 0809 12/20/23 0821  BP:  (!) 142/53 (!) 140/53 137/67  Pulse:  91 91 91  Resp:  13 13 13   Temp:  98.6 F (37 C)    TempSrc:  Oral    SpO2:  100% 98% 96%  Weight: 71.7 kg 74.8 kg 74.8 kg   Height:       Physical Exam General: Awake, alert, on RA, NAD ENT: dry MM Heart: S1 and S2; No wheezing, gallops, or rubs Lungs: Clear anteriorly Extremities: No LE edema Dialysis Access: R AVF +t/b  Filed Weights   12/20/23 0600 12/20/23 0808 12/20/23 0809  Weight: 71.7 kg 74.8 kg 74.8 kg    Intake/Output Summary (Last 24 hours) at 12/20/2023 0839 Last data filed at 12/20/2023 0500 Gross per 24 hour  Intake --  Output 225 ml  Net -225 ml     Additional Objective Labs: Basic Metabolic Panel: Recent Labs  Lab 12/18/23 0250 12/19/23 0313 12/20/23 0421  NA 138 136 137  K 4.4 3.6 3.8  CL 96* 98 98  CO2 21* 26 25  GLUCOSE 67* 90 91  BUN 76* 37* 52*  CREATININE 8.65* 5.28* 6.80*  CALCIUM  8.8* 8.7* 8.9  PHOS 5.1* 4.0 5.3*   Liver Function Tests: Recent Labs  Lab 12/13/23 1358 12/15/23 0834 12/18/23 0250 12/19/23 0313 12/20/23 0421  AST 13*  --   --   --  18  ALT 11  --   --   --  15  ALKPHOS 132*  --   --   --  136*  BILITOT 0.3  --   --   --  0.7  PROT 5.9*  --   --   --  5.3*  ALBUMIN  3.2*   < > 2.0* 1.9* 2.0*   < > = values in this interval not displayed.   No results for input(s): LIPASE, AMYLASE in the last 168 hours. CBC: Recent Labs  Lab 12/13/23 1358 12/14/23 0652 12/16/23 0710 12/17/23 0326 12/18/23 0250 12/19/23 0313 12/20/23 0421  WBC 17.2*   < > 12.5* 13.0* 16.6* 14.9* 13.2*  NEUTROABS 14.8*  --   --   --   --   --   --   HGB 8.8*   < > 8.4* 8.5* 8.2* 8.4* 8.5*  HCT 26.2*   < > 25.4* 25.4*  25.1* 25.5* 25.5*  MCV 93.9   < > 94.4 95.1 95.8 94.8 94.8  PLT 330   < > 374 387 393 401* 430*   < > = values in this interval not displayed.   Blood Culture    Component Value Date/Time   SDES  12/13/2023 1411    BLOOD LEFT ANTECUBITAL Performed at [facility], [address] [place], [place] [address]    [place]  12/13/2023 1411    BOTTLES DRAWN AEROBIC AND ANAEROBIC Blood Culture adequate volume Performed at [facility], [address] [place], [place] [address]    CULT  12/13/2023 1411    NO GROWTH 5 DAYS Performed at [facility], [address], [place], [place] [address]    REPTSTATUS 12/18/2023 FINAL 12/13/2023 1411    Cardiac Enzymes: No results for input(s): CKTOTAL, CKMB, CKMBINDEX, TROPONINI in the last 168 hours.  CBG: No results for input(s): GLUCAP in the last 168 hours. Iron Studies: No results for input(s): IRON, TIBC, TRANSFERRIN, FERRITIN in the last 72 hours. No results found for: INR, PROTIME Studies/Results: No results found.  Medications:  anticoagulant sodium citrate        acetaminophen   1,000 mg Oral Q6H   albuterol   1-2 puff Inhalation Q12H   atorvastatin   80 mg Oral q AM   febuxostat   40 mg Oral q AM   feeding supplement (NEPRO CARB STEADY)  237 mL Oral BID BM   heparin  injection (subcutaneous)  5,000 Units Subcutaneous Q8H   metoprolol  tartrate  25 mg Oral BID   pantoprazole   40 mg Oral Daily   rOPINIRole   0.5 mg Oral BID   sevelamer  carbonate  1,600 mg Oral TID WC    Dialysis Orders: [facility] TTS 3h   B350   68.7kg   2K bath  AVF   Heparin  none Last hd 9/02 post wt 69.9kg  Mircera 30 mcg q 4, last 8/26   Home bp meds: Metoprolol  xl 50 hs Lasix  80 mg non hd days  Assessment/Plan:  Perforated bowel due to incarcerated hernia: S/p segmental colon resection with creation of colostomy and incisional hernia repair with vicryl mesh on 9/4. Pip/tazo completed  9/9. Per pmd/ surgery - HH to be arranged but nearing d/c ESRD: on HD TTS. HD today BP: bp's acceptable, follow.  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 Follow, transfuse prn.  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 No VDRA. Continue binders.  Dispo:  no transportation to/from HD, may need to go to CIR or SNF for now - SW working on it Will follow as she remains admitted.   Manuelita Barters MD [facility] Pager [phone]

## 2023-12-20 NOTE — Progress Notes (Signed)
 Mobility Specialist Progress Note:    12/20/23 1551  Mobility  Activity Ambulated with assistance (In hallway)  Level of Assistance Standby assist, set-up cues, supervision of patient - no hands on  Assistive Device Front wheel walker  Distance Ambulated (ft) 115 ft  Activity Response Tolerated well  Mobility Referral Yes  Mobility visit 1 Mobility  Mobility Specialist Start Time (ACUTE ONLY) 1500  Mobility Specialist Stop Time (ACUTE ONLY) 1524  Mobility Specialist Time Calculation (min) (ACUTE ONLY) 24 min   Received pt in bed and agreeable to mobility. No physical assistance needed. No c/o. Returned to room without fault. Pt requested to use the [facility]; voided successfully. MS assisted pt with pericare. Pt left in bed with alarm on. Personal belongings and call light within reach. All needs met.  Lavanda Pollack Mobility Specialist  Please contact via [employer] or  Rehab Office [phone]

## 2023-12-21 DIAGNOSIS — K631 Perforation of intestine (nontraumatic): Secondary | ICD-10-CM | POA: Diagnosis not present

## 2023-12-21 MED ORDER — DARBEPOETIN ALFA 60 MCG/0.3ML IJ SOSY
60.0000 ug | PREFILLED_SYRINGE | INTRAMUSCULAR | Status: DC
  Administered 2023-12-22: 60 ug via SUBCUTANEOUS
  Filled 2023-12-21: qty 0.3

## 2023-12-21 NOTE — Discharge Instructions (Addendum)
 [facility]      [facility], [place] [phone]  OPEN ABDOMINAL SURGERY: POST OP INSTRUCTIONS  Always review your discharge instruction sheet given to you by the facility where your surgery was performed.  IF YOU HAVE DISABILITY OR FAMILY LEAVE FORMS, YOU MUST BRING THEM TO THE OFFICE FOR PROCESSING.  PLEASE DO NOT GIVE THEM TO YOUR DOCTOR.  A prescription for pain medication may be given to you upon discharge.  Take your pain medication as prescribed, if needed.  If narcotic pain medicine is not needed, then you may take acetaminophen  (Tylenol ) or ibuprofen (Advil) as needed. Take your usually prescribed medications unless otherwise directed. If you need a refill on your pain medication, please contact your pharmacy. They will contact our office to request authorization.  Prescriptions will not be filled after 5pm or on week-ends. You should follow a light diet the first few days after arrival home, such as soup and crackers, pudding, etc.unless your doctor has advised otherwise. A high-fiber, low fat diet can be resumed as tolerated.   Be sure to include lots of fluids daily. Most patients will experience some swelling and bruising on the chest and neck area.  Ice packs will help.  Swelling and bruising can take several days to resolve Most patients will experience some swelling and bruising in the area of the incision. Ice pack will help. Swelling and bruising can take several days to resolve..  It is common to experience some constipation if taking pain medication after surgery.  Increasing fluid intake and taking a stool softener will usually help or prevent this problem from occurring.  A mild laxative (Milk of Magnesia or Miralax) should be taken according to package directions if there are no bowel movements after 48 hours.  You may have steri-strips (small skin tapes) in place directly over the incision.  These strips should be left on the skin for 7-10 days.  If your surgeon used skin  glue on the incision, you may shower in 24 hours.  The glue will flake off over the next 2-3 weeks.  Any sutures or staples will be removed at the office during your follow-up visit. You may find that a light gauze bandage over your incision may keep your staples from being rubbed or pulled. You may shower and replace the bandage daily. ACTIVITIES:  You may resume regular (light) daily activities beginning the next day--such as daily self-care, walking, climbing stairs--gradually increasing activities as tolerated.  You may have sexual intercourse when it is comfortable.  Refrain from any heavy lifting or straining until approved by your doctor. You may drive when you no longer are taking prescription pain medication, you can comfortably wear a seatbelt, and you can safely maneuver your car and apply brakes Return to Work: ___________________________________ Rachel Villarreal should see your doctor in the office for a follow-up appointment approximately two weeks after your surgery.  Make sure that you call for this appointment within a day or two after you arrive home to insure a convenient appointment time. OTHER INSTRUCTIONS:  _____________________________________________________________ _____________________________________________________________  WHEN TO CALL YOUR DOCTOR: Fever over 101.0 Inability to urinate Nausea and/or vomiting Extreme swelling or bruising Continued bleeding from incision. Increased pain, redness, or drainage from the incision. Difficulty swallowing or breathing Muscle cramping or spasms. Numbness or tingling in hands or feet or around lips.  The clinic staff is available to answer your questions during regular business hours.  Please don't hesitate to call and ask to speak to one of  the nurses if you have concerns.  For further questions, please visit [email or web address]

## 2023-12-21 NOTE — Progress Notes (Signed)
 Physical Therapy Treatment Patient Details Name: Rachel Villarreal MRN: [id number] DOB: 01/25/1946 Today's Date: 12/21/2023   History of Present Illness 78 y.o. female admitted on 12/13/23 s/p ex lap with segmental TC resection with end colostomy, abdominal wall debridement, and ventral incisional hernia repair with vicryl mesh, Dr. Dasie 12/13/2023 for transverse colon perforation within an incarcerated ventral hernia  PMH: HTN, ESRD on HD, breast cancer, CAD, HFimpEF, PAF on eliquis .    PT Comments  Pt resting in bed on arrival, pleasant and agreeable to session. Pt demonstrating steady progress towards acute goals, however continues to be limited in safe mobility by global weakness, decreased activity tolerance and poor balance/postural reactions, requiring AD use for all mobility. Pt needing hands on assist to manage wound vac and lines throughout session. Pt requiring grossly CGA throughout mobility for safety and stability. Pt from home alone without assist level outlined below and and without current access to transportation to/from HD and further appointments. Pt remains at risk for falls and will benefit from continued inpatient follow up therapy, <3 hours/day  to address deficits and maximize functional independence and safety. Pt continues to benefit from skilled PT services to progress toward functional mobility goals.     If plan is discharge home, recommend the following: Assist for transportation;A little help with walking and/or transfers;A little help with bathing/dressing/bathroom;Help with stairs or ramp for entrance;Assistance with cooking/housework   Can travel by private vehicle        Equipment Recommendations  None recommended by PT    Recommendations for Other Services       Precautions / Restrictions Precautions Precautions: Fall Recall of Precautions/Restrictions: Intact Precaution/Restrictions Comments: abdominal precautions Restrictions Weight Bearing Restrictions  Per Provider Order: No     Mobility  Bed Mobility Overal bed mobility: Needs Assistance Bed Mobility: Sit to Supine, Supine to Sit     Supine to sit: Supervision, HOB elevated, Used rails Sit to supine: Supervision   General bed mobility comments: supervision for safety no assist needed    Transfers Overall transfer level: Needs assistance Equipment used: Rolling walker (2 wheels) Transfers: Sit to/from Stand, Bed to chair/wheelchair/BSC Sit to Stand: Supervision           General transfer comment: good recall for hand placement, supervision for safety    Ambulation/Gait Ambulation/Gait assistance: Supervision, Contact guard assist Gait Distance (Feet): 200 Feet (x2) Assistive device: Rolling walker (2 wheels) Gait Pattern/deviations: Step-through pattern, Decreased stride length Gait velocity: decr     General Gait Details: grossly supervision for safety, no overt LOB,   Stairs Stairs: Yes Stairs assistance: Modified independent (Device/Increase time) Stair Management: Step to pattern, Forwards, One rail Right Number of Stairs: 4 General stair comments: up/down with unilateral rail support   Wheelchair Mobility     Tilt Bed    Modified Rankin (Stroke Patients Only)       Balance                                            Communication Communication Communication: No apparent difficulties  Cognition Arousal: Alert Behavior During Therapy: WFL for tasks assessed/performed   PT - Cognitive impairments: No apparent impairments                         Following commands: Intact      Cueing Cueing Techniques:  Verbal cues, Tactile cues  Exercises      General Comments General comments (skin integrity, edema, etc.): VSS on RA      Pertinent Vitals/Pain Pain Assessment Pain Assessment: No/denies pain    Home Living                          Prior Function            PT Goals (current goals can  now be found in the care plan section) Acute Rehab PT Goals PT Goal Formulation: With patient Time For Goal Achievement: 12/29/23 Progress towards PT goals: Progressing toward goals    Frequency    Min 2X/week      PT Plan      Co-evaluation              AM-PAC PT 6 Clicks Mobility   Outcome Measure  Help needed turning from your back to your side while in a flat bed without using bedrails?: A Little Help needed moving from lying on your back to sitting on the side of a flat bed without using bedrails?: A Little Help needed moving to and from a bed to a chair (including a wheelchair)?: A Little Help needed standing up from a chair using your arms (e.g., wheelchair or bedside chair)?: A Little Help needed to walk in hospital room?: A Little Help needed climbing 3-5 steps with a railing? : A Lot 6 Click Score: 17    End of Session   Activity Tolerance: Patient tolerated treatment well Patient left: with call bell/phone within reach;in bed Nurse Communication: Mobility status PT Visit Diagnosis: Other abnormalities of gait and mobility (R26.89);Unsteadiness on feet (R26.81)     Time: 9075-9053 PT Time Calculation (min) (ACUTE ONLY): 22 min  Charges:    $Gait Training: 8-22 mins PT General Charges $$ ACUTE PT VISIT: 1 Visit                     Delberta Folts R. PTA Acute Rehabilitation Services Office: [phone]   [name] 12/21/2023, 10:05 AM

## 2023-12-21 NOTE — Progress Notes (Signed)
 [facility] Progress Note   Subjective:   Seen and examined patient at bedside.  Tolerated HD fine.  Safe dispo issues being worked on.   Objective Vitals:   12/20/23 1418 12/20/23 1607 12/20/23 2205 12/21/23 0533  BP: (!) 144/52 (!) 134/55 (!) 144/60 137/60  Pulse: (!) 105 82 87 86  Resp: 18 17 20 18   Temp: 98.8 F (37.1 C) 98.6 F (37 C) 98.8 F (37.1 C) 98.4 F (36.9 C)  TempSrc: Oral  Oral Oral  SpO2:  96% 97% 96%  Weight:      Height:       Physical Exam General: Awake, alert, on RA, NAD ENT: dry MM Heart: S1 and S2; No wheezing, gallops, or rubs Lungs: Clear anteriorly Extremities: No LE edema Dialysis Access: R AVF +t/b  Filed Weights   12/20/23 0808 12/20/23 0809 12/20/23 1223  Weight: 74.8 kg 74.8 kg 70.3 kg    Intake/Output Summary (Last 24 hours) at 12/21/2023 0754 Last data filed at 12/21/2023 9385 Gross per 24 hour  Intake 600 ml  Output 2275 ml  Net -1675 ml     Additional Objective Labs: Basic Metabolic Panel: Recent Labs  Lab 12/18/23 0250 12/19/23 0313 12/20/23 0421  NA 138 136 137  K 4.4 3.6 3.8  CL 96* 98 98  CO2 21* 26 25  GLUCOSE 67* 90 91  BUN 76* 37* 52*  CREATININE 8.65* 5.28* 6.80*  CALCIUM  8.8* 8.7* 8.9  PHOS 5.1* 4.0 5.3*   Liver Function Tests: Recent Labs  Lab 12/18/23 0250 12/19/23 0313 12/20/23 0421  AST  --   --  18  ALT  --   --  15  ALKPHOS  --   --  136*  BILITOT  --   --  0.7  PROT  --   --  5.3*  ALBUMIN  2.0* 1.9* 2.0*   No results for input(s): LIPASE, AMYLASE in the last 168 hours. CBC: Recent Labs  Lab 12/16/23 0710 12/17/23 0326 12/18/23 0250 12/19/23 0313 12/20/23 0421  WBC 12.5* 13.0* 16.6* 14.9* 13.2*  HGB 8.4* 8.5* 8.2* 8.4* 8.5*  HCT 25.4* 25.4* 25.1* 25.5* 25.5*  MCV 94.4 95.1 95.8 94.8 94.8  PLT 374 387 393 401* 430*   Blood Culture    Component Value Date/Time   SDES  12/13/2023 1411    BLOOD LEFT ANTECUBITAL Performed at [facility], [address] [place], [place] [address]    [facility]  12/13/2023 1411    BOTTLES DRAWN AEROBIC AND ANAEROBIC Blood Culture adequate volume Performed at [facility], [address], [place], [place] [address]    CULT  12/13/2023 1411    NO GROWTH 5 DAYS Performed at [facility], [address], [place], [place] [address]    REPTSTATUS 12/18/2023 FINAL 12/13/2023 1411    Cardiac Enzymes: No results for input(s): CKTOTAL, CKMB, CKMBINDEX, TROPONINI in the last 168 hours. CBG: No results for input(s): GLUCAP in the last 168 hours. Iron Studies: No results for input(s): IRON, TIBC, TRANSFERRIN, FERRITIN in the last 72 hours. No results found for: INR, PROTIME Studies/Results: No results found.  Medications:     acetaminophen   1,000 mg Oral Q6H   albuterol   1-2 puff Inhalation Q12H   atorvastatin   80 mg Oral q AM   febuxostat   40 mg Oral q AM   feeding supplement (NEPRO CARB STEADY)  237 mL Oral BID BM   heparin  injection (subcutaneous)  5,000 Units Subcutaneous Q8H  metoprolol  tartrate  25 mg Oral BID   pantoprazole   40 mg Oral Daily   rOPINIRole   0.5 mg Oral BID   sevelamer  carbonate  1,600 mg Oral TID WC    Dialysis Orders: [facility] T[facility] 3h   B350   68.7kg   2K bath  AVF   Heparin  none Last hd 9/02 post wt 69.9kg  Mircera 30 mcg q 4, last 8/26   Home bp meds: Metoprolol  xl 50 hs Lasix  80 mg non hd days  Assessment/Plan:  Perforated bowel due to incarcerated hernia: S/p segmental colon resection with creation of colostomy and incisional hernia repair with vicryl mesh on 9/4. Pip/tazo completed 9/9. Per pmd/ surgery. Wound vac in place ESRD: on HD TTS. HD tomorrow if remains inpt BP: bp's acceptable, follow.  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 Follow, transfuse prn.  Will give dose aranesp  as she's been in the 8s consistently 2nd HPTH:  Corr Ca up and phos ok. No VDRA. Continue binders.  Dispo:  no transportation to/from HD, PT/OT helping with d/c planning.  Manuelita Barters MD [facility] Pager [phone]

## 2023-12-21 NOTE — NC FL2 (Signed)
 [place]  MEDICAID FL2 LEVEL OF CARE FORM     IDENTIFICATION  Patient Name: Rachel Villarreal Birthdate: 09/16/1945 Sex: female Admission Date (Current Location): 12/13/2023  [facility] and [place] Number:  Producer, television/film/video and Address:  [facility], [address], [place], [place] [address]      Provider Number: [id number]  Attending Physician Name and Address:  Trixie Nilda HERO, MD  Relative Name and Phone Number:       Current Level of Care: Hospital Recommended Level of Care: Skilled Nursing Facility Prior Approval Number:    Date Approved/Denied:   PASRR Number: [id number]  Discharge Plan: SNF    Current Diagnoses: Patient Active Problem List   Diagnosis Date Noted   Perforated bowel (HCC) 12/14/2023   Atrial flutter (HCC) 12/14/2023   SAH (subarachnoid hemorrhage) (HCC) 12/06/2023   Acute ischemic stroke (HCC) 12/06/2023   Scalp laceration, initial encounter 12/06/2023   Anemia of chronic renal failure 12/06/2023   Coronary artery disease 12/06/2023   Malnutrition of moderate degree 04/24/2023   Influenza A with pneumonia 04/17/2023   Paroxysmal atrial fibrillation (HCC) 04/17/2023   Non-ST elevated myocardial infarction (HCC) 04/16/2023   ESRD on dialysis (HCC) 03/09/2023   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   Insomnia 03/09/2023   Anemia in chronic kidney disease 12/12/2022   Coagulation defect, unspecified (HCC) 12/12/2022   History of hysterectomy, supracervical 03/01/2022   History of loop electrical excision procedure (LEEP) 03/01/2022   Breast cancer (HCC) 09/12/2021   Malignant neoplasm of upper-outer quadrant of left breast in female, estrogen receptor positive (HCC) 08/29/2021   Allergic rhinitis 01/20/2021   GERD (gastroesophageal reflux disease) 01/20/2021   Osteopenia 01/20/2021   Pure hypercholesterolemia 01/20/2021   Vitamin D deficiency 01/20/2021   Gout 03/28/2016   Essential hypertension 03/28/2016   Hyperlipidemia  03/28/2016   Hormone replacement therapy (HRT) 11/18/2013    Orientation RESPIRATION BLADDER Height & Weight     Self, Time, Situation, Place  Normal Incontinent Weight: 154 lb 15.7 oz (70.3 kg) Height:  5' 2 (157.5 cm)  BEHAVIORAL SYMPTOMS/MOOD NEUROLOGICAL BOWEL NUTRITION STATUS      Colostomy Diet (See DC Summary)  AMBULATORY STATUS COMMUNICATION OF NEEDS Skin   Limited Assist Verbally Other (Comment) (Wound on abdomen)                       Personal Care Assistance Level of Assistance  Bathing, Feeding, Dressing Bathing Assistance: Limited assistance Feeding assistance: Limited assistance Dressing Assistance: Limited assistance     Functional Limitations Info  Sight, Hearing, Speech Sight Info: Adequate Hearing Info: Adequate Speech Info: Adequate    SPECIAL CARE FACTORS FREQUENCY  PT (By licensed PT), OT (By licensed OT)     PT Frequency: 5x/week OT Frequency: 5x/week            Contractures Contractures Info: Not present    Additional Factors Info  Code Status, Allergies Code Status Info: Full Allergies Info: Ace Inhibitors; Lactose Intolerance (gi); Letrozole ; Lisinopril; Mercury; Tape           Current Medications (12/21/2023):  This is the current hospital active medication list Current Facility-Administered Medications  Medication Dose Route Frequency Provider Last Rate Last Admin   acetaminophen  (TYLENOL ) tablet 1,000 mg  1,000 mg Oral Q6H Tammy Sor, PA-C   1,000 mg at 12/21/23 1321   albuterol  (PROVENTIL ) (2.5 MG/3ML) 0.083% nebulizer solution 2.5 mg  2.5 mg Nebulization Q2H PRN Patel, Pranav M, MD  albuterol  (VENTOLIN  HFA) 108 (90 Base) MCG/ACT inhaler 1-2 puff  1-2 puff Inhalation Q12H Gherghe, Costin M, MD   2 puff at 12/21/23 0847   atorvastatin  (LIPITOR ) tablet 80 mg  80 mg Oral q AM Patel, Pranav M, MD   80 mg at 12/21/23 0844   [START ON 12/22/2023] Darbepoetin Alfa  (ARANESP ) injection 60 mcg  60 mcg Subcutaneous Q Sat-1800  Kruska, Lindsay A, MD       febuxostat  (ULORIC ) tablet 40 mg  40 mg Oral q AM Patel, Pranav M, MD   40 mg at 12/21/23 0845   feeding supplement (NEPRO CARB STEADY) liquid 237 mL  237 mL Oral BID BM Patel, Pranav M, MD   237 mL at 12/21/23 1324   heparin  injection 5,000 Units  5,000 Units Subcutaneous Q8H Tammy Sor, PA-C   5,000 Units at 12/21/23 1321   HYDROmorphone  (DILAUDID ) injection 0.5 mg  0.5 mg Intravenous Q4H PRN Patel, Pranav M, MD       methocarbamol  (ROBAXIN ) tablet 500 mg  500 mg Oral Q8H PRN Patel, Pranav M, MD       metoprolol  tartrate (LOPRESSOR ) tablet 25 mg  25 mg Oral BID Claiborne, Claudia, MD   25 mg at 12/21/23 0845   ondansetron  (ZOFRAN ) injection 4 mg  4 mg Intravenous Q6H PRN Chavez, Abigail, NP   4 mg at 12/15/23 1024   pantoprazole  (PROTONIX ) EC tablet 40 mg  40 mg Oral Daily Patel, Pranav M, MD   40 mg at 12/21/23 0845   rOPINIRole  (REQUIP ) tablet 0.5 mg  0.5 mg Oral BID Patel, Pranav M, MD   0.5 mg at 12/21/23 0845   sevelamer  carbonate (RENVELA ) tablet 1,600 mg  1,600 mg Oral TID WC Patel, Pranav M, MD   1,600 mg at 12/21/23 1321   traMADol  (ULTRAM ) tablet 50 mg  50 mg Oral Q6H PRN Belinda Cough, MD   50 mg at 12/16/23 1620   zolpidem  (AMBIEN ) tablet 5 mg  5 mg Oral QHS PRN Patel, Pranav M, MD   5 mg at 12/20/23 2234     Discharge Medications: Please see discharge summary for a list of discharge medications.  Relevant Imaging Results:  Relevant Lab Results:   Additional Information SSN: [id number]  Jeoffrey LITTIE Moose, LCSWA

## 2023-12-21 NOTE — Progress Notes (Signed)
 Progress Note  8 Days Post-Op  Subjective: Patient reports pain is manageable. Continuing to tolerate soft diet. Having stool output in ostomy with flatulence. Denies nausea and vomiting. Patient participated in ostomy education yesterday.  ROS  All negative with the exception of above.  Objective: Vital signs in last 24 hours: Temp:  [98 F (36.7 C)-98.8 F (37.1 C)] 98 F (36.7 C) (09/12 0759) Pulse Rate:  [82-105] 92 (09/12 0759) Resp:  [12-23] 16 (09/12 0759) BP: (99-156)/(49-68) 156/53 (09/12 0759) SpO2:  [95 %-100 %] 95 % (09/12 0759) Weight:  [70.3 kg] 70.3 kg (09/11 1223) Last BM Date : 12/18/23  Intake/Output from previous day: 09/11 0701 - 09/12 0700 In: 600 [P.O.:600] Out: 2300 [Drains:50; Stool:250] Intake/Output this shift: No intake/output data recorded.  PE: General: Pleasant female who is laying in bed in NAD. Lungs: Respiratory effort nonlabored Abd: Soft, NT, ND. Ostomy noted of right abdomen. Stoma viable. Brown thin stool and gas noted in the ostomy pouch. Midline incision covered with wound vac dressing. Abdominal binder applied. Skin: Warm and dry with no masses, lesions, or rashes Psych: A&Ox3 with an appropriate affect.    Lab Results:  Recent Labs    12/19/23 0313 12/20/23 0421  WBC 14.9* 13.2*  HGB 8.4* 8.5*  HCT 25.5* 25.5*  PLT 401* 430*   BMET Recent Labs    12/19/23 0313 12/20/23 0421  NA 136 137  K 3.6 3.8  CL 98 98  CO2 26 25  GLUCOSE 90 91  BUN 37* 52*  CREATININE 5.28* 6.80*  CALCIUM  8.7* 8.9   PT/INR No results for input(s): LABPROT, INR in the last 72 hours. CMP     Component Value Date/Time   NA 137 12/20/2023 0421   NA 141 05/10/2023 1314   K 3.8 12/20/2023 0421   CL 98 12/20/2023 0421   CO2 25 12/20/2023 0421   GLUCOSE 91 12/20/2023 0421   BUN 52 (H) 12/20/2023 0421   BUN 40 (H) 05/10/2023 1314   CREATININE 6.80 (H) 12/20/2023 0421   CREATININE 5.78 (H) 11/19/2023 1140   CALCIUM  8.9 12/20/2023  0421   PROT 5.3 (L) 12/20/2023 0421   PROT 5.5 (L) 05/10/2023 1314   ALBUMIN  2.0 (L) 12/20/2023 0421   ALBUMIN  3.4 (L) 05/10/2023 1314   AST 18 12/20/2023 0421   AST 14 (L) 11/19/2023 1140   ALT 15 12/20/2023 0421   ALT 11 11/19/2023 1140   ALKPHOS 136 (H) 12/20/2023 0421   BILITOT 0.7 12/20/2023 0421   BILITOT 0.4 11/19/2023 1140   GFRNONAA 6 (L) 12/20/2023 0421   GFRNONAA 7 (L) 11/19/2023 1140   Lipase  No results found for: LIPASE     Studies/Results: No results found.  Anti-infectives: Anti-infectives (From admission, onward)    Start     Dose/Rate Route Frequency Ordered Stop   12/13/23 2200  piperacillin -tazobactam (ZOSYN ) IVPB 2.25 g        2.25 g 100 mL/hr over 30 Minutes Intravenous Every 8 hours 12/13/23 2113 12/19/23 0847   12/13/23 2111  piperacillin -tazobactam (ZOSYN ) 3.375 GM/50ML IVPB       Note to Pharmacy: Golob, Jamie C: cabinet override      12/13/23 2111 12/14/23 0914   12/13/23 1415  piperacillin -tazobactam (ZOSYN ) IVPB 3.375 g        3.375 g 100 mL/hr over 30 Minutes Intravenous  Once 12/13/23 1413 12/13/23 1653        Assessment/Plan POD8: S/p ex lap with segmental TC resection with end  colostomy, abdominal wall debridement, and ventral incisional hernia repair with vicryl mesh, Dr. Dasie 12/13/2023 for transverse colon perforation within an incarcerated ventral hernia. -Afebrile. Hypertensive. Hemodynamically stable. -No labs today. Labs from 9/11: WBC 13.2 from 14.9; HGB 8.5 from 8.4 -Completed 5 days of Zosyn . Ended 12/18/23. -Tolerating soft diet without n/v. Having ostomy output with flatus. -Pain manageable. -Continue to mobilize as tolerated with PT; PT recommending home health PT. Orders have been placed.  -IS for pulm toilet -Wound vac placed 12/18/2023. Had ostomy teaching 9/11. Placed referral for ostomy clinic. -Continue abdominal binder -Patient is stable for discharge from general surgery standpoint once medically cleared. Will  arrange outpatient follow up appointment. Please call for further questions or concerns.   FEN: Soft; feeding supplement; IVF per primary team VTE: Scds; Heparin  injections; darbepoetin alfa  ID: Course of Zosyn  completed 9/9.    LOS: 8 days   I reviewed specialist notes, hospitalist notes, last 24 h vitals and pain scores, last 48 h intake and output, last 24 h labs and trends, and last 24 h imaging results.   Marjorie Carlyon Favre, [facility] 12/21/2023, 8:11 AM Please see Amion for pager number during day hours 7:00am-4:30pm

## 2023-12-21 NOTE — Progress Notes (Signed)
 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 General surgery was consulted.  Patient underwent exploratory laparotomy with colon resection and colostomy with mesh and ventral hernia.  Subjective / 24h Interval events: Feels somewhat nauseous this morning.  Just worked with PT and she tells me that the therapist will recommend rehab.  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  She is status post exploratory laparotomy with colon resection and creation of colostomy as well as abdominal wall debridement with ventral incisional hernia repair. Has a wound VAC in place now - Continue management per surgery, wound VAC to be changed today.  She also had ostomy teaching - Status post a course of Zosyn    Active problems ESRD on HD - Currently on HD TTS.  Nephrology consulted and following, HD tomorrow   History of SAH - Hospitalized on 8/28 and 8/30. Had a fall with SAH secondary to Eliquis . Currently holding anticoagulation.   Paroxysmal A-fib - On Toprol -XL.  50 mg daily.  Currently on Lopressor  25 mg twice daily. Eliquis  on hold after her recent [facility].   History of CAD - No active complaints.  Not on any antiplatelet medication. Monitor.   Scalp laceration - Suture placed on 8/28.  Removed 9/11   Restless leg syndrome - continue home Requip    GERD - Continue Protonix .   History of breast cancer - On anastrozole .   Chronic insomnia - Continue Ambien  nightly.   Scheduled Meds:  acetaminophen   1,000 mg Oral Q6H   albuterol   1-2 puff Inhalation Q12H    atorvastatin   80 mg Oral q AM   [START ON 12/22/2023] darbepoetin (ARANESP ) injection - DIALYSIS  60 mcg Subcutaneous Q Sat-1800   febuxostat   40 mg Oral q AM   feeding supplement (NEPRO CARB STEADY)  237 mL Oral BID BM   heparin  injection (subcutaneous)  5,000 Units Subcutaneous Q8H   metoprolol  tartrate  25 mg Oral BID   pantoprazole   40 mg Oral Daily   rOPINIRole   0.5 mg Oral BID   sevelamer  carbonate  1,600 mg Oral TID WC   Continuous Infusions:   PRN Meds:.albuterol , HYDROmorphone  (DILAUDID ) injection, methocarbamol , ondansetron  (ZOFRAN ) IV, traMADol , zolpidem   Current Outpatient Medications  Medication Instructions   acetaminophen  (TYLENOL ) 500 mg, Oral, Every 6 hours PRN   anastrozole  (ARIMIDEX ) 1 mg, Oral, Daily   atorvastatin  (LIPITOR ) 80 mg, Oral, Every morning   budesonide  (RHINOCORT  AQUA) 32 MCG/ACT nasal spray 2 sprays, Each Nare, Every morning   Cholecalciferol 2,000 Units, Oral, Every morning   diclofenac  Sodium (VOLTAREN ) 1 % GEL 1 Application, Topical, 4 times daily PRN   docusate sodium  (COLACE) 100 mg, Oral, Daily PRN   doxycycline  (VIBRA -TABS) 100 mg, Oral, 2 times daily   eszopiclone (LUNESTA) 2 mg, Oral, Daily at bedtime   febuxostat  (ULORIC ) 40 mg, Oral, Every morning   furosemide  (LASIX ) 80 mg, Oral, See admin instructions, Take 1 tablet by mouth on non-dialysis days, Mon, Wed, Fri, Sun   loratadine  (CLARITIN ) 10 mg, Oral, 2 times daily   Methoxy PEG-Epoetin  Beta (MIRCERA IJ) 60 mcg   metoprolol   succinate (TOPROL -XL) 100 MG 24 hr tablet Take 0.5-1 tablet (50-100 mg total) by mouth daily as needed for elevated heart rate. Take with or immediately following a meal.   Multiple Vitamins-Minerals (PRESERVISION AREDS 2+MULTI VIT PO) 1 capsule, 2 times daily   ondansetron  (ZOFRAN ) 4 mg, See admin instructions   pantoprazole  (PROTONIX ) 40 mg, Oral, Daily   PROAIR  HFA 108 (90 BASE) MCG/ACT inhaler 1-2 puffs, Every 6 hours PRN   rOPINIRole  (REQUIP ) 0.5 mg, Oral, Every  12 hours PRN   sevelamer  carbonate (RENVELA ) 1,600 mg, 3 times daily with meals    Diet Orders (From admission, onward)     Start     Ordered   12/16/23 0802  DIET SOFT Room service appropriate? Yes; Fluid consistency: Thin  Diet effective now       Question Answer Comment  Room service appropriate? Yes   Fluid consistency: Thin      12/16/23 0803            DVT prophylaxis: heparin  injection 5,000 Units Start: 12/14/23 1400 SCDs Start: 12/14/23 9663   Lab Results  Component Value Date   PLT 430 (H) 12/20/2023      Code Status: Full Code  Family Communication: no family at bedside   Status is: Inpatient Remains inpatient appropriate because: severity of illness  Level of care: Telemetry Surgical  Consultants:  General surgery   Objective: Vitals:   12/20/23 1607 12/20/23 2205 12/21/23 0533 12/21/23 0759  BP: (!) 134/55 (!) 144/60 137/60 (!) 156/53  Pulse: 82 87 86 92  Resp: 17 20 18 16   Temp: 98.6 F (37 C) 98.8 F (37.1 C) 98.4 F (36.9 C) 98 F (36.7 C)  TempSrc:  Oral Oral Oral  SpO2: 96% 97% 96% 95%  Weight:      Height:        Intake/Output Summary (Last 24 hours) at 12/21/2023 1030 Last data filed at 12/21/2023 9385 Gross per 24 hour  Intake 600 ml  Output 2275 ml  Net -1675 ml   Wt Readings from Last 3 Encounters:  12/20/23 70.3 kg  12/07/23 68 kg  11/29/23 68 kg    Examination:  Constitutional: NAD Eyes: lids and conjunctivae normal, no scleral icterus ENMT: mmm Neck: normal, supple Respiratory: clear to auscultation bilaterally, no wheezing, no crackles. Normal respiratory effort.  Cardiovascular: Regular rate and rhythm, no murmurs / rubs / gallops. No LE edema. Abdomen: soft, no distention, no tenderness. Bowel sounds positive.    Data Reviewed: I have independently reviewed following labs and imaging studies   CBC Recent Labs  Lab 12/16/23 0710 12/17/23 0326 12/18/23 0250 12/19/23 0313 12/20/23 0421  WBC 12.5* 13.0*  16.6* 14.9* 13.2*  HGB 8.4* 8.5* 8.2* 8.4* 8.5*  HCT 25.4* 25.4* 25.1* 25.5* 25.5*  PLT 374 387 393 401* 430*  MCV 94.4 95.1 95.8 94.8 94.8  MCH 31.2 31.8 31.3 31.2 31.6  MCHC 33.1 33.5 32.7 32.9 33.3  RDW 15.5 15.4 15.3 15.3 15.4    Recent Labs  Lab 12/16/23 0710 12/17/23 0326 12/18/23 0250 12/19/23 0313 12/20/23 0421  NA 135 137 138 136 137  K 4.2 3.9 4.4 3.6 3.8  CL 95* 97* 96* 98 98  CO2 23 26 21* 26 25  GLUCOSE 87 95 67* 90 91  BUN 52* 66* 76* 37* 52*  CREATININE 6.04* 7.49* 8.65* 5.28* 6.80*  CALCIUM  8.7* 8.5* 8.8* 8.7* 8.9  AST  --   --   --   --  18  ALT  --   --   --   --  15  ALKPHOS  --   --   --   --  136*  BILITOT  --   --   --   --  0.7  ALBUMIN  2.2* 2.1* 2.0* 1.9* 2.0*  MG 2.0 2.0 2.0 1.8 1.9    ------------------------------------------------------------------------------------------------------------------ No results for input(s): CHOL, HDL, LDLCALC, TRIG, CHOLHDL, LDLDIRECT in the last 72 hours.  Lab Results  Component Value Date   HGBA1C 5.2 04/17/2023   ------------------------------------------------------------------------------------------------------------------ No results for input(s): TSH, T4TOTAL, T3FREE, THYROIDAB in the last 72 hours.  Invalid input(s): FREET3  Cardiac Enzymes No results for input(s): CKMB, TROPONINI, MYOGLOBIN in the last 168 hours.  Invalid input(s): CK ------------------------------------------------------------------------------------------------------------------    Component Value Date/Time   BNP 1,710.2 (H) 10/23/2023 0222    CBG: No results for input(s): GLUCAP in the last 168 hours.  Recent Results (from the past 240 hours)  Culture, blood (routine x 2)     Status: None   Collection Time: 12/13/23  2:05 PM   Specimen: BLOOD  Result Value Ref Range Status   Specimen Description   Final    BLOOD BLOOD LEFT HAND Performed at [facility], [address], [place], [place] [address]    Special Requests   Final    BOTTLES DRAWN AEROBIC AND ANAEROBIC Blood Culture adequate volume Performed at [facility], [address] [place], [place] [address]    Culture   Final    NO GROWTH 5 DAYS Performed at [facility], [address], [place], [place] [address]    Report Status 12/18/2023 FINAL  Final  Culture, blood (routine x 2)     Status: None   Collection Time: 12/13/23  2:11 PM   Specimen: BLOOD  Result Value Ref Range Status   Specimen Description   Final    BLOOD LEFT ANTECUBITAL Performed at [facility], [address], [place], [place] [address]    Special Requests   Final    BOTTLES DRAWN AEROBIC AND ANAEROBIC Blood Culture adequate volume Performed at [facility], [address] [place], [place] [address]    Culture   Final    NO GROWTH 5 DAYS Performed at [facility], [address] [place], [place] [address]    Report Status 12/18/2023 FINAL  Final  Surgical pcr screen     Status: None   Collection Time: 12/13/23  5:35 PM   Specimen: Nasal Mucosa; Nasal Swab  Result Value Ref Range Status   MRSA, PCR NEGATIVE NEGATIVE Final   Staphylococcus aureus NEGATIVE NEGATIVE Final    Comment: (NOTE) The Xpert SA Assay (FDA approved for NASAL specimens in patients 41 years of age and older), is one component of a comprehensive surveillance program. It is not intended to diagnose infection nor to guide or monitor treatment. Performed at [facility], [address] [place], [place] [address]      Radiology Studies: No results found.   Nilda Fendt, MD, PhD [facility]  Between 7 am - 7 pm I am available, please contact me via Amion (for emergencies) or Securechat (non urgent messages)  Between 7 pm - 7 am I am not available, please contact night coverage MD/APP via Amion

## 2023-12-21 NOTE — Evaluation (Signed)
 Occupational Therapy Evaluation Patient Details Name: Rachel Villarreal MRN: [id number] DOB: 08-Aug-1945 Today's Date: 12/21/2023   History of Present Illness   78 y.o. female admitted on 12/13/23 s/p ex lap with segmental TC resection with end colostomy, abdominal wall debridement, and ventral incisional hernia repair with vicryl mesh, Dr. Dasie 12/13/2023 for transverse colon perforation within an incarcerated ventral hernia  PMH: HTN, ESRD on HD, breast cancer, CAD, HFimpEF, PAF on eliquis .     Clinical Impressions Pt is typically independent, lives alone and drives to appointments. Presents with generalized weakness, decreased activity tolerance and impaired dynamic standing balance. Pt with baseline numbness of radial side of her R (dominant hand). Pt demonstrates decreased awareness and ability to manage wound vac. She reports difficulty manipulating colostomy supplies due to decreased fine motor manipulation. Pt requires up to supervision for basic ADLs and mobility with RW. She lacks support at home for assistance with IADLs, medical management and access to transportation to HD and follow up appointments. Pt with history of dizziness with fall resulting in recent [facility] Patient will benefit from continued inpatient follow up therapy, <3 hours/day with potential for pt to function independently prior to return home alone.      If plan is discharge home, recommend the following:   Assistance with cooking/housework;Help with stairs or ramp for entrance;Assist for transportation;A little help with bathing/dressing/bathroom     Functional Status Assessment   Patient has had a recent decline in their functional status and demonstrates the ability to make significant improvements in function in a reasonable and predictable amount of time.     Equipment Recommendations   None recommended by OT     Recommendations for Other Services         Precautions/Restrictions    Precautions Precautions: Fall Recall of Precautions/Restrictions: Intact Precaution/Restrictions Comments: abdominal precautions/wound vac Required Braces or Orthoses: Other Brace Other Brace: abdominal binder Restrictions Weight Bearing Restrictions Per Provider Order: No     Mobility Bed Mobility Overal bed mobility: Needs Assistance Bed Mobility: Sit to Supine, Supine to Sit     Supine to sit: Supervision Sit to supine: Supervision   General bed mobility comments: assist for wound vac line    Transfers Overall transfer level: Needs assistance Equipment used: Rolling walker (2 wheels)   Sit to Stand: Supervision           General transfer comment: supervision for safety and wound vac line      Balance Overall balance assessment: Mild deficits observed, not formally tested                                         ADL either performed or assessed with clinical judgement   ADL Overall ADL's : Needs assistance/impaired Eating/Feeding: Independent   Grooming: Standing;Supervision/safety   Upper Body Bathing: Set up;Sitting   Lower Body Bathing: Supervison/ safety;Sit to/from stand   Upper Body Dressing : Set up;Sitting Upper Body Dressing Details (indicate cue type and reason): recommended binder under cotton shirt Lower Body Dressing: Supervision/safety;Sit to/from stand Lower Body Dressing Details (indicate cue type and reason): does not wear socks Toilet Transfer: Supervision/safety;Ambulation;Comfort height toilet;Rolling walker (2 wheels)   Toileting- Clothing Manipulation and Hygiene: Supervision/safety;Sit to/from stand       Functional mobility during ADLs: Supervision/safety;Rolling walker (2 wheels)       Vision Patient Visual Report: No change from baseline  Perception         Praxis         Pertinent Vitals/Pain Pain Assessment Pain Assessment: Faces Faces Pain Scale: No hurt     Extremity/Trunk  Assessment Upper Extremity Assessment Upper Extremity Assessment: Right hand dominant;RUE deficits/detail RUE Deficits / Details: numbness on radial side of hand RUE Sensation: decreased light touch RUE Coordination: decreased fine motor   Lower Extremity Assessment Lower Extremity Assessment: Defer to PT evaluation   Cervical / Trunk Assessment Cervical / Trunk Assessment: Other exceptions Cervical / Trunk Exceptions: abdominal surgery with wound vac and new colostomy   Communication Communication Communication: No apparent difficulties   Cognition Arousal: Alert Behavior During Therapy: WFL for tasks assessed/performed Cognition: No apparent impairments                               Following commands: Intact       Cueing  General Comments   Cueing Techniques: Verbal cues  VSS on RA   Exercises     Shoulder Instructions      Home Living Family/patient expects to be discharged to:: Private residence Living Arrangements: Alone Available Help at Discharge: Friend(s);Available PRN/intermittently Type of Home: House Home Access: Stairs to enter [employer] of Steps: 3 STE Entrance Stairs-Rails: None Home Layout: One level Alternate Level Stairs-Number of Steps: a step to the den and a step to the kitchen. Alternate Level Stairs-Rails: None Bathroom Shower/Tub: Tub/shower unit;Walk-in shower   Bathroom Toilet: Handicapped height     Home Equipment: Shower seat - built [employer] fundraiser (2 wheels);Cane - single point          Prior Functioning/Environment Prior Level of Function : Independent/Modified Independent;Driving                    OT Problem List: Decreased activity tolerance;Impaired balance (sitting and/or standing);Obesity   OT Treatment/Interventions: Self-care/ADL training;DME and/or AE instruction;Therapeutic activities;Patient/family education;Balance training;Energy conservation      OT Goals(Current goals  can be found in the care plan section)   Acute Rehab OT Goals OT Goal Formulation: With patient Time For Goal Achievement: 01/04/24 Potential to Achieve Goals: Good ADL Goals Additional ADL Goal #1: Pt will complete basic ADLs mod I. Additional ADL Goal #2: Pt will gather items necessary for ADLs. Additional ADL Goal #3: Pt will manage wound vac equipment, abdominal binder and colostomy independently. Additional ADL Goal #4: Pt will generalize energy conservation strategies during ADLs and mobility.   OT Frequency:  Min 2X/week    Co-evaluation              AM-PAC OT 6 Clicks Daily Activity     Outcome Measure Help from another person eating meals?: None Help from another person taking care of personal grooming?: A Little Help from another person toileting, which includes using toliet, bedpan, or urinal?: A Little Help from another person bathing (including washing, rinsing, drying)?: A Little Help from another person to put on and taking off regular upper body clothing?: None Help from another person to put on and taking off regular lower body clothing?: A Little 6 Click Score: 20   End of Session Equipment Utilized During Treatment: Gait belt;Rolling walker (2 wheels);Other (comment) (abcominal binder)  Activity Tolerance: Patient tolerated treatment well Patient left: in bed;with call bell/phone within reach  OT Visit Diagnosis: Muscle weakness (generalized) (M62.81);History of falling (Z91.81);Unsteadiness on feet (R26.81) (decreased activity tolerance)  Time: 1205-1232 OT Time Calculation (min): 27 min Charges:  OT General Charges $OT Visit: 1 Visit OT Evaluation $OT Eval Low Complexity: 1 Low OT Treatments $Self Care/Home Management : 8-22 mins  Mliss HERO, OTR/L Acute Rehabilitation Services Office: [phone]   Kennth Mliss Helling 12/21/2023, 1:12 PM

## 2023-12-21 NOTE — Progress Notes (Signed)
 Contacted pt's HD clinic to be advised that staff continue to work on a safe d/c plan. Will provide update to clinic on pt's d/c date and plan once confirmed. Will assist as needed.   Randine Mungo Dialysis Navigator [phone]

## 2023-12-21 NOTE — Progress Notes (Signed)
 [facility] Progress Note   Subjective:   Seen and examined patient at bedside.  Tolerated HD fine yesterday.  Safe dispo issues being worked on.   Objective Vitals:   12/20/23 1607 12/20/23 2205 12/21/23 0533 12/21/23 0759  BP: (!) 134/55 (!) 144/60 137/60 (!) 156/53  Pulse: 82 87 86 92  Resp: 17 20 18 16   Temp: 98.6 F (37 C) 98.8 F (37.1 C) 98.4 F (36.9 C) 98 F (36.7 C)  TempSrc:  Oral Oral Oral  SpO2: 96% 97% 96% 95%  Weight:      Height:       Physical Exam General: Awake, alert, on RA, NAD ENT: dry MM Heart: S1 and S2; No wheezing, gallops, or rubs Lungs: Clear anteriorly Extremities: No LE edema Dialysis Access: R AVF +t/b  Filed Weights   12/20/23 0808 12/20/23 0809 12/20/23 1223  Weight: 74.8 kg 74.8 kg 70.3 kg    Intake/Output Summary (Last 24 hours) at 12/21/2023 0829 Last data filed at 12/21/2023 9385 Gross per 24 hour  Intake 600 ml  Output 2275 ml  Net -1675 ml     Additional Objective Labs: Basic Metabolic Panel: Recent Labs  Lab 12/18/23 0250 12/19/23 0313 12/20/23 0421  NA 138 136 137  K 4.4 3.6 3.8  CL 96* 98 98  CO2 21* 26 25  GLUCOSE 67* 90 91  BUN 76* 37* 52*  CREATININE 8.65* 5.28* 6.80*  CALCIUM  8.8* 8.7* 8.9  PHOS 5.1* 4.0 5.3*   Liver Function Tests: Recent Labs  Lab 12/18/23 0250 12/19/23 0313 12/20/23 0421  AST  --   --  18  ALT  --   --  15  ALKPHOS  --   --  136*  BILITOT  --   --  0.7  PROT  --   --  5.3*  ALBUMIN  2.0* 1.9* 2.0*   No results for input(s): LIPASE, AMYLASE in the last 168 hours. CBC: Recent Labs  Lab 12/16/23 0710 12/17/23 0326 12/18/23 0250 12/19/23 0313 12/20/23 0421  WBC 12.5* 13.0* 16.6* 14.9* 13.2*  HGB 8.4* 8.5* 8.2* 8.4* 8.5*  HCT 25.4* 25.4* 25.1* 25.5* 25.5*  MCV 94.4 95.1 95.8 94.8 94.8  PLT 374 387 393 401* 430*   Blood Culture    Component Value Date/Time   SDES  12/13/2023 1411    BLOOD LEFT ANTECUBITAL Performed at [facility], [address] [place], [place] [address]    [facility]  12/13/2023 1411    BOTTLES DRAWN AEROBIC AND ANAEROBIC Blood Culture adequate volume Performed at [facility], [address], [place], [place] [address]    CULT  12/13/2023 1411    NO GROWTH 5 DAYS Performed at [facility], [address], [place], [place] [address]    REPTSTATUS 12/18/2023 FINAL 12/13/2023 1411    Cardiac Enzymes: No results for input(s): CKTOTAL, CKMB, CKMBINDEX, TROPONINI in the last 168 hours. CBG: No results for input(s): GLUCAP in the last 168 hours. Iron Studies: No results for input(s): IRON, TIBC, TRANSFERRIN, FERRITIN in the last 72 hours. No results found for: INR, PROTIME Studies/Results: No results found.  Medications:     acetaminophen   1,000 mg Oral Q6H   albuterol   1-2 puff Inhalation Q12H   atorvastatin   80 mg Oral q AM   [START ON 12/22/2023] darbepoetin (ARANESP ) injection - DIALYSIS  60 mcg Subcutaneous Q Sat-1800   febuxostat   40 mg Oral q AM   feeding supplement (NEPRO CARB STEADY)  237 mL Oral BID BM   heparin  injection (subcutaneous)  5,000 Units Subcutaneous Q8H   metoprolol  tartrate  25 mg Oral BID   pantoprazole   40 mg Oral Daily   rOPINIRole   0.5 mg Oral BID   sevelamer  carbonate  1,600 mg Oral TID WC    Dialysis Orders: [facility] TTS 3h   B350   68.7kg   2K bath  AVF   Heparin  none Last hd 9/02 post wt 69.9kg  Mircera 30 mcg q 4, last 8/26   Home bp meds: Metoprolol  xl 50 hs Lasix  80 mg non hd days  Assessment/Plan:  Perforated bowel due to incarcerated hernia: S/p segmental colon resection with creation of colostomy and incisional hernia repair with vicryl mesh on 9/4. Pip/tazo completed 9/9. Per pmd/ surgery. Wound vac in place ESRD: on HD TTS. HD tomorrow if remains inpt BP: bp's acceptable, follow.  Atrial fib: getting metoprolol  25 bid  Volume: euvolemic UF 1.5L today as tolerated Anemia of  esrd: Hb 8-10 here. Follow, transfuse prn.  Will give dose aranesp  as she's been in the 8s consistently 2nd HPTH: Corr Ca up and phos ok. No VDRA. Continue binders.  Dispo:  no transportation to/from HD, PT/OT helping with d/c planning.  Manuelita Barters MD [facility] Pager [phone]

## 2023-12-22 DIAGNOSIS — K631 Perforation of intestine (nontraumatic): Secondary | ICD-10-CM | POA: Diagnosis not present

## 2023-12-22 LAB — BASIC METABOLIC PANEL WITH GFR
Anion gap: 12 (ref 5–15)
BUN: 45 mg/dL — ABNORMAL HIGH (ref 8–23)
CO2: 24 mmol/L (ref 22–32)
Calcium: 9 mg/dL (ref 8.9–10.3)
Chloride: 102 mmol/L (ref 98–111)
Creatinine, Ser: 5.74 mg/dL — ABNORMAL HIGH (ref 0.44–1.00)
GFR, Estimated: 7 mL/min — ABNORMAL LOW (ref >60)
Glucose, Bld: 122 mg/dL — ABNORMAL HIGH (ref 70–99)
Potassium: 3.6 mmol/L (ref 3.5–5.1)
Sodium: 138 mmol/L (ref 135–145)

## 2023-12-22 LAB — MAGNESIUM: Magnesium: 1.8 mg/dL (ref 1.7–2.4)

## 2023-12-22 LAB — CBC
HCT: 24.8 % — ABNORMAL LOW (ref 36.0–46.0)
Hemoglobin: 8 g/dL — ABNORMAL LOW (ref 12.0–15.0)
MCH: 31 pg (ref 26.0–34.0)
MCHC: 32.3 g/dL (ref 30.0–36.0)
MCV: 96.1 fL (ref 80.0–100.0)
Platelets: 359 K/uL (ref 150–400)
RBC: 2.58 MIL/uL — ABNORMAL LOW (ref 3.87–5.11)
RDW: 15.8 % — ABNORMAL HIGH (ref 11.5–15.5)
WBC: 9.8 K/uL (ref 4.0–10.5)
nRBC: 0 % (ref 0.0–0.2)

## 2023-12-22 NOTE — NC FL2 (Signed)
 [employer]  MEDICAID FL2 LEVEL OF CARE FORM     IDENTIFICATION  Patient Name: Rachel Villarreal Birthdate: 1945/06/29 Sex: female Admission Date (Current Location): 12/13/2023  [facility] [place] and [place] Number:  Producer, television/film/video and Address:  [facility], [address], [place], [place] [address]      Provider Number: [id number]  Attending Physician Name and Address:  Soledad Ligas, DO  Relative Name and Phone Number:       Current Level of Care: Hospital Recommended Level of Care: Skilled Nursing Facility Prior Approval Number:    Date Approved/Denied:   PASRR Number: [id number]  Discharge Plan: SNF    Current Diagnoses: Patient Active Problem List   Diagnosis Date Noted   Perforated bowel (HCC) 12/14/2023   Atrial flutter (HCC) 12/14/2023   SAH (subarachnoid hemorrhage) (HCC) 12/06/2023   Acute ischemic stroke (HCC) 12/06/2023   Scalp laceration, initial encounter 12/06/2023   Anemia of chronic renal failure 12/06/2023   Coronary artery disease 12/06/2023   Malnutrition of moderate degree 04/24/2023   Influenza A with pneumonia 04/17/2023   Paroxysmal atrial fibrillation (HCC) 04/17/2023   Non-ST elevated myocardial infarction (HCC) 04/16/2023   ESRD on dialysis (HCC) 03/09/2023   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   Insomnia 03/09/2023   Anemia in chronic kidney disease 12/12/2022   Coagulation defect, unspecified (HCC) 12/12/2022   History of hysterectomy, supracervical 03/01/2022   History of loop electrical excision procedure (LEEP) 03/01/2022   Breast cancer (HCC) 09/12/2021   Malignant neoplasm of upper-outer quadrant of left breast in female, estrogen receptor positive (HCC) 08/29/2021   Allergic rhinitis 01/20/2021   GERD (gastroesophageal reflux disease) 01/20/2021   Osteopenia 01/20/2021   Pure hypercholesterolemia 01/20/2021   Vitamin D deficiency 01/20/2021   Gout 03/28/2016   Essential hypertension 03/28/2016   Hyperlipidemia  03/28/2016   Hormone replacement therapy (HRT) 11/18/2013    Orientation RESPIRATION BLADDER Height & Weight     Self, Time, Situation, Place  Normal Incontinent Weight: 154 lb 15.7 oz (70.3 kg) Height:  5' 2 (157.5 cm)  BEHAVIORAL SYMPTOMS/MOOD NEUROLOGICAL BOWEL NUTRITION STATUS      Colostomy Diet (See DC Summary)  AMBULATORY STATUS COMMUNICATION OF NEEDS Skin   Limited Assist Verbally Surgical wounds, Wound Vac                       Personal Care Assistance Level of Assistance  Bathing, Feeding, Dressing Bathing Assistance: Limited assistance Feeding assistance: Limited assistance Dressing Assistance: Limited assistance     Functional Limitations Info  Sight, Hearing, Speech Sight Info: Adequate Hearing Info: Adequate Speech Info: Adequate    SPECIAL CARE FACTORS FREQUENCY  PT (By licensed PT), OT (By licensed OT)     PT Frequency: 5x/week OT Frequency: 5x/week            Contractures Contractures Info: Not present    Additional Factors Info  Code Status, Allergies Code Status Info: Full Allergies Info: Ace Inhibitors; Lactose Intolerance (gi); Letrozole ; Lisinopril; Mercury; Tape           Current Medications (12/22/2023):  This is the current hospital active medication list Current Facility-Administered Medications  Medication Dose Route Frequency Provider Last Rate Last Admin   acetaminophen  (TYLENOL ) tablet 1,000 mg  1,000 mg Oral Q6H Tammy Sor, PA-C   1,000 mg at 12/22/23 9380   albuterol  (PROVENTIL ) (2.5 MG/3ML) 0.083% nebulizer solution 2.5 mg  2.5 mg Nebulization Q2H PRN Patel, Pranav M, MD  albuterol  (VENTOLIN  HFA) 108 (90 Base) MCG/ACT inhaler 1-2 puff  1-2 puff Inhalation Q12H Gherghe, Costin M, MD   1 puff at 12/22/23 0815   atorvastatin  (LIPITOR ) tablet 80 mg  80 mg Oral q AM Patel, Pranav M, MD   80 mg at 12/22/23 9380   Darbepoetin Alfa  (ARANESP ) injection 60 mcg  60 mcg Subcutaneous Q Sat-1800 Kruska, Lindsay A, MD        febuxostat  (ULORIC ) tablet 40 mg  40 mg Oral q AM Patel, Pranav M, MD   40 mg at 12/22/23 9379   feeding supplement (NEPRO CARB STEADY) liquid 237 mL  237 mL Oral BID BM Patel, Pranav M, MD   237 mL at 12/21/23 1324   heparin  injection 5,000 Units  5,000 Units Subcutaneous Q8H Tammy Sor, PA-C   5,000 Units at 12/22/23 9352   HYDROmorphone  (DILAUDID ) injection 0.5 mg  0.5 mg Intravenous Q4H PRN Patel, Pranav M, MD       methocarbamol  (ROBAXIN ) tablet 500 mg  500 mg Oral Q8H PRN Patel, Pranav M, MD       metoprolol  tartrate (LOPRESSOR ) tablet 25 mg  25 mg Oral BID Claiborne, Claudia, MD   25 mg at 12/22/23 0810   ondansetron  (ZOFRAN ) injection 4 mg  4 mg Intravenous Q6H PRN Chavez, Abigail, NP   4 mg at 12/15/23 1024   pantoprazole  (PROTONIX ) EC tablet 40 mg  40 mg Oral Daily Patel, Pranav M, MD   40 mg at 12/22/23 0810   rOPINIRole  (REQUIP ) tablet 0.5 mg  0.5 mg Oral BID Patel, Pranav M, MD   0.5 mg at 12/22/23 0810   sevelamer  carbonate (RENVELA ) tablet 1,600 mg  1,600 mg Oral TID WC Patel, Pranav M, MD   1,600 mg at 12/22/23 0810   traMADol  (ULTRAM ) tablet 50 mg  50 mg Oral Q6H PRN Belinda Cough, MD   50 mg at 12/16/23 1620   zolpidem  (AMBIEN ) tablet 5 mg  5 mg Oral QHS PRN Patel, Pranav M, MD   5 mg at 12/21/23 2208     Discharge Medications: Please see discharge summary for a list of discharge medications.  Relevant Imaging Results:  Relevant Lab Results:   Additional Information SSN: [id number];  HD at [facility], TTS, chair time 1030 am; needs wound vac  Luise JAYSON Pan, LCSWA

## 2023-12-22 NOTE — TOC Progression Note (Signed)
 Transition of Care [facility]) - Progression Note    Patient Details  Name: Rachel Villarreal MRN: [id number] Date of Birth: 10-Feb-1946  [facility] CM/SW Contact  Luise JAYSON Pan, [place] Phone Number: 12/22/2023, 10:09 AM  Clinical Narrative:   CSW spoke with patient about PT recommendation for SNF. Patient is agreeable to SNF at this time. CSW faxed patient referral to facilities.   CSW will continue to follow.    Expected Discharge Plan: Skilled Nursing Facility Barriers to Discharge: Continued Medical Work up, SNF Pending bed offer, English as a second language teacher               Expected Discharge Plan and Services   Discharge Planning Services: CM Consult Post Acute Care Choice: Home Health Living arrangements for the past 2 months: Single Family Home                 DME Arranged: N/A         HH Arranged:  (see note)           Social Drivers of Health (SDOH) Interventions SDOH Screenings   Food Insecurity: No Food Insecurity (12/13/2023)  Housing: Low Risk  (12/13/2023)  Transportation Needs: No Transportation Needs (12/13/2023)  Utilities: Not At Risk (12/13/2023)  Depression (PHQ2-9): Low Risk  (02/28/2022)  Financial Resource Strain: Low Risk  (09/02/2021)  Social Connections: Moderately Integrated (12/13/2023)  Stress: No Stress Concern Present (09/02/2021)  Tobacco Use: Medium Risk (12/13/2023)    Readmission Risk Interventions     No data to display

## 2023-12-22 NOTE — Progress Notes (Signed)
 [facility] Progress Note   Subjective:   Seen and examined patient at bedside.  For HD today.  Safe dispo issues being worked on - likely SNF now  Objective Vitals:   12/21/23 0759 12/21/23 1536 12/21/23 2110 12/22/23 0513  BP: (!) 156/53 (!) 131/48 (!) 141/59 (!) 141/60  Pulse: 92 88 91 83  Resp: 16 16 19 18   Temp: 98 F (36.7 C) 99.2 F (37.3 C) 98.5 F (36.9 C) 97.7 F (36.5 C)  TempSrc: Oral Oral Oral Oral  SpO2: 95% 96% 94% 98%  Weight:      Height:       Physical Exam General: Awake, alert, on RA, NAD ENT: dry MM Heart: S1 and S2; No wheezing, gallops, or rubs Lungs: Clear anteriorly Extremities: No LE edema Dialysis Access: R AVF +t/b  Filed Weights   12/20/23 0808 12/20/23 0809 12/20/23 1223  Weight: 74.8 kg 74.8 kg 70.3 kg    Intake/Output Summary (Last 24 hours) at 12/22/2023 0812 Last data filed at 12/22/2023 0500 Gross per 24 hour  Intake 320 ml  Output 550 ml  Net -230 ml     Additional Objective Labs: Basic Metabolic Panel: Recent Labs  Lab 12/18/23 0250 12/19/23 0313 12/20/23 0421 12/22/23 0310  NA 138 136 137 138  K 4.4 3.6 3.8 3.6  CL 96* 98 98 102  CO2 21* 26 25 24   GLUCOSE 67* 90 91 122*  BUN 76* 37* 52* 45*  CREATININE 8.65* 5.28* 6.80* 5.74*  CALCIUM  8.8* 8.7* 8.9 9.0  PHOS 5.1* 4.0 5.3*  --    Liver Function Tests: Recent Labs  Lab 12/18/23 0250 12/19/23 0313 12/20/23 0421  AST  --   --  18  ALT  --   --  15  ALKPHOS  --   --  136*  BILITOT  --   --  0.7  PROT  --   --  5.3*  ALBUMIN  2.0* 1.9* 2.0*   No results for input(s): LIPASE, AMYLASE in the last 168 hours. CBC: Recent Labs  Lab 12/17/23 0326 12/18/23 0250 12/19/23 0313 12/20/23 0421 12/22/23 0310  WBC 13.0* 16.6* 14.9* 13.2* 9.8  HGB 8.5* 8.2* 8.4* 8.5* 8.0*  HCT 25.4* 25.1* 25.5* 25.5* 24.8*  MCV 95.1 95.8 94.8 94.8 96.1  PLT 387 393 401* 430* 359   Blood Culture    Component Value Date/Time   SDES  12/13/2023 1411    BLOOD  LEFT ANTECUBITAL Performed at [facility], [address] [place], [place] [address]    [facility]  12/13/2023 1411    BOTTLES DRAWN AEROBIC AND ANAEROBIC Blood Culture adequate volume Performed at [facility], [address] [place], [place] [address]    CULT  12/13/2023 1411    NO GROWTH 5 DAYS Performed at [facility], [address], [place], [place] [address]    REPTSTATUS 12/18/2023 FINAL 12/13/2023 1411    Cardiac Enzymes: No results for input(s): CKTOTAL, CKMB, CKMBINDEX, TROPONINI in the last 168 hours. CBG: No results for input(s): GLUCAP in the last 168 hours. Iron Studies: No results for input(s): IRON, TIBC, TRANSFERRIN, FERRITIN in the last 72 hours. No results found for: INR, PROTIME Studies/Results: No results found.  Medications:     acetaminophen   1,000 mg Oral Q6H   albuterol   1-2 puff Inhalation Q12H   atorvastatin   80 mg Oral q AM   darbepoetin (ARANESP ) injection - DIALYSIS  60 mcg Subcutaneous Q Sat-1800   febuxostat   40 mg Oral q AM   feeding supplement (NEPRO CARB STEADY)  237 mL Oral BID BM   heparin  injection (subcutaneous)  5,000 Units Subcutaneous Q8H   metoprolol  tartrate  25 mg Oral BID   pantoprazole   40 mg Oral Daily   rOPINIRole   0.5 mg Oral BID   sevelamer  carbonate  1,600 mg Oral TID WC    Dialysis Orders: [facility] T[facility] 3h   B350   68.7kg   2K bath  AVF   Heparin  none Last hd 9/02 post wt 69.9kg  Mircera 30 mcg q 4, last 8/26   Home bp meds: Metoprolol  xl 50 hs Lasix  80 mg non hd days  Assessment/Plan:  Perforated bowel due to incarcerated hernia: S/p segmental colon resection with creation of colostomy and incisional hernia repair with vicryl mesh on 9/4. Pip/tazo completed 9/9. Per pmd/ surgery. Wound vac in place ESRD: on HD TTS. HD today BP: bp's acceptable, follow.  Atrial fib: getting metoprolol  25 bid  Volume: euvolemic UF 1.5L today as  tolerated Anemia of esrd: Hb 8-10 here. Follow, transfuse prn.  Will give dose aranesp  as she's been in the 8s consistently 2nd HPTH: Corr Ca up and phos ok. No VDRA. Continue binders.  Dispo:  no transportation to/from HD, PT/OT helping with d/c planning.  Manuelita Barters MD [facility] Pager [phone]

## 2023-12-22 NOTE — Progress Notes (Signed)
 PROGRESS NOTE  Rachel Villarreal FMW:[id number] DOB: 1945-06-28 DOA: 12/13/2023 PCP: Ransom Other, MD   LOS: 9 days   Brief Narrative / Interim history: Patient with PMH of ESRD on TTS, PAF on Eliquis , HFpEF, CAD, HLD, anemia of CKD, breast cancer on left presents to the hospital with complaints of abdominal wound. General surgery was consulted.  Patient underwent exploratory laparotomy with colon resection and colostomy with mesh and ventral hernia.  Subjective / 24h Interval events: Feels somewhat nauseous this morning.  Just worked with PT and she tells me that the therapist will recommend rehab.  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  She is status post exploratory laparotomy with colon resection and creation of colostomy as well as abdominal wall debridement with ventral incisional hernia repair. Has a wound VAC in place now - Continue management per surgery, wound VAC to be changed today.  She also had ostomy teaching - Status post a course of Zosyn    Active problems ESRD on HD - Currently on HD TTS.  Nephrology consulted and following, HD tomorrow   History of SAH - Hospitalized on 8/28 and 8/30. Had a fall with SAH secondary to Eliquis . Currently holding anticoagulation.   Paroxysmal A-fib - On Toprol -XL.  50 mg daily.  Currently on Lopressor  25 mg twice daily. Eliquis  on hold after her recent [facility].   History of CAD - No active complaints.  Not on any antiplatelet medication. Monitor.   Scalp laceration - Suture placed on 8/28.  Removed 9/11   Restless leg syndrome - continue home Requip    GERD - Continue Protonix .   History of breast cancer - On anastrozole .   Chronic insomnia - Continue Ambien  nightly.   Scheduled Meds:  acetaminophen   1,000 mg Oral Q6H   albuterol   1-2 puff Inhalation Q12H    atorvastatin   80 mg Oral q AM   darbepoetin (ARANESP ) injection - DIALYSIS  60 mcg Subcutaneous Q Sat-1800   febuxostat   40 mg Oral q AM   feeding supplement (NEPRO CARB STEADY)  237 mL Oral BID BM   heparin  injection (subcutaneous)  5,000 Units Subcutaneous Q8H   metoprolol  tartrate  25 mg Oral BID   pantoprazole   40 mg Oral Daily   rOPINIRole   0.5 mg Oral BID   sevelamer  carbonate  1,600 mg Oral TID WC   Continuous Infusions:   PRN Meds:.albuterol , HYDROmorphone  (DILAUDID ) injection, methocarbamol , ondansetron  (ZOFRAN ) IV, traMADol , zolpidem   Current Outpatient Medications  Medication Instructions   acetaminophen  (TYLENOL ) 500 mg, Oral, Every 6 hours PRN   anastrozole  (ARIMIDEX ) 1 mg, Oral, Daily   atorvastatin  (LIPITOR ) 80 mg, Oral, Every morning   budesonide  (RHINOCORT  AQUA) 32 MCG/ACT nasal spray 2 sprays, Each Nare, Every morning   Cholecalciferol 2,000 Units, Oral, Every morning   diclofenac  Sodium (VOLTAREN ) 1 % GEL 1 Application, Topical, 4 times daily PRN   docusate sodium  (COLACE) 100 mg, Oral, Daily PRN   doxycycline  (VIBRA -TABS) 100 mg, Oral, 2 times daily   eszopiclone (LUNESTA) 2 mg, Oral, Daily at bedtime   febuxostat  (ULORIC ) 40 mg, Oral, Every morning   furosemide  (LASIX ) 80 mg, Oral, See admin instructions, Take 1 tablet by mouth on non-dialysis days, Mon, Wed, Fri, Sun   loratadine  (CLARITIN ) 10 mg, Oral, 2 times daily   Methoxy PEG-Epoetin  Beta (MIRCERA IJ) 60 mcg   metoprolol  succinate (TOPROL -XL) 100  MG 24 hr tablet Take 0.5-1 tablet (50-100 mg total) by mouth daily as needed for elevated heart rate. Take with or immediately following a meal.   Multiple Vitamins-Minerals (PRESERVISION AREDS 2+MULTI VIT PO) 1 capsule, 2 times daily   ondansetron  (ZOFRAN ) 4 mg, See admin instructions   pantoprazole  (PROTONIX ) 40 mg, Oral, Daily   PROAIR  HFA 108 (90 BASE) MCG/ACT inhaler 1-2 puffs, Every 6 hours PRN   rOPINIRole  (REQUIP ) 0.5 mg, Oral, Every 12 hours PRN    sevelamer  carbonate (RENVELA ) 1,600 mg, 3 times daily with meals    Diet Orders (From admission, onward)     Start     Ordered   12/16/23 0802  DIET SOFT Room service appropriate? Yes; Fluid consistency: Thin  Diet effective now       Question Answer Comment  Room service appropriate? Yes   Fluid consistency: Thin      12/16/23 0803            DVT prophylaxis: heparin  injection 5,000 Units Start: 12/14/23 1400 SCDs Start: 12/14/23 0336   Lab Results  Component Value Date   PLT 359 12/22/2023      Code Status: Full Code  Family Communication: no family at bedside   Status is: Inpatient Remains inpatient appropriate because: severity of illness  Level of care: Telemetry Surgical  Consultants:  General surgery   Objective: Vitals:   12/22/23 1550 12/22/23 1555 12/22/23 1600 12/22/23 1705  BP: (!) 133/54 (!) 133/55 133/65 (!) 144/60  Pulse: 85 84 90 92  Resp: 20 (!) 22 20 16   Temp: 98.7 F (37.1 C) 97.7 F (36.5 C) 97.7 F (36.5 C) 99.1 F (37.3 C)  TempSrc: Oral   Oral  SpO2:  100% 94% 98%  Weight:   72.6 kg   Height:        Intake/Output Summary (Last 24 hours) at 12/22/2023 1752 Last data filed at 12/22/2023 1600 Gross per 24 hour  Intake 430 ml  Output 3550 ml  Net -3120 ml   Wt Readings from Last 3 Encounters:  12/22/23 72.6 kg  12/07/23 68 kg  11/29/23 68 kg    Examination:  Constitutional: NAD Eyes: lids and conjunctivae normal, no scleral icterus ENMT: mmm Neck: normal, supple Respiratory: clear to auscultation bilaterally, no wheezing, no crackles. Normal respiratory effort.  Cardiovascular: Regular rate and rhythm, no murmurs / rubs / gallops. No LE edema. Abdomen: soft, no distention, no tenderness. Bowel sounds positive.    Data Reviewed: I have independently reviewed following labs and imaging studies   CBC Recent Labs  Lab 12/17/23 0326 12/18/23 0250 12/19/23 0313 12/20/23 0421 12/22/23 0310  WBC 13.0* 16.6* 14.9*  13.2* 9.8  HGB 8.5* 8.2* 8.4* 8.5* 8.0*  HCT 25.4* 25.1* 25.5* 25.5* 24.8*  PLT 387 393 401* 430* 359  MCV 95.1 95.8 94.8 94.8 96.1  MCH 31.8 31.3 31.2 31.6 31.0  MCHC 33.5 32.7 32.9 33.3 32.3  RDW 15.4 15.3 15.3 15.4 15.8*    Recent Labs  Lab 12/16/23 0710 12/17/23 0326 12/18/23 0250 12/19/23 0313 12/20/23 0421 12/22/23 0310  NA 135 137 138 136 137 138  K 4.2 3.9 4.4 3.6 3.8 3.6  CL 95* 97* 96* 98 98 102  CO2 23 26 21* 26 25 24   GLUCOSE 87 95 67* 90 91 122*  BUN 52* 66* 76* 37* 52* 45*  CREATININE 6.04* 7.49* 8.65* 5.28* 6.80* 5.74*  CALCIUM  8.7* 8.5* 8.8* 8.7* 8.9 9.0  AST  --   --   --   --  18  --   ALT  --   --   --   --  15  --   ALKPHOS  --   --   --   --  136*  --   BILITOT  --   --   --   --  0.7  --   ALBUMIN  2.2* 2.1* 2.0* 1.9* 2.0*  --   MG 2.0 2.0 2.0 1.8 1.9 1.8    ------------------------------------------------------------------------------------------------------------------ No results for input(s): CHOL, HDL, LDLCALC, TRIG, CHOLHDL, LDLDIRECT in the last 72 hours.  Lab Results  Component Value Date   HGBA1C 5.2 04/17/2023   ------------------------------------------------------------------------------------------------------------------ No results for input(s): TSH, T4TOTAL, T3FREE, THYROIDAB in the last 72 hours.  Invalid input(s): FREET3  Cardiac Enzymes No results for input(s): CKMB, TROPONINI, MYOGLOBIN in the last 168 hours.  Invalid input(s): CK ------------------------------------------------------------------------------------------------------------------    Component Value Date/Time   BNP 1,710.2 (H) 10/23/2023 0222    CBG: No results for input(s): GLUCAP in the last 168 hours.  Recent Results (from the past 240 hours)  Culture, blood (routine x 2)     Status: None   Collection Time: 12/13/23  2:05 PM   Specimen: BLOOD  Result Value Ref Range Status   Specimen Description   Final    BLOOD  BLOOD LEFT HAND Performed at [facility], [address] [place], [place] [address]    Special Requests   Final    BOTTLES DRAWN AEROBIC AND ANAEROBIC Blood Culture adequate volume Performed at [facility], [address] [place], [place] [address]    Culture   Final    NO GROWTH 5 DAYS Performed at [facility], [address] [place], [place] [address]    Report Status 12/18/2023 FINAL  Final  Culture, blood (routine x 2)     Status: None   Collection Time: 12/13/23  2:11 PM   Specimen: BLOOD  Result Value Ref Range Status   Specimen Description   Final    BLOOD LEFT ANTECUBITAL Performed at [facility], [address] [place], [place] [address]    Special Requests   Final    BOTTLES DRAWN AEROBIC AND ANAEROBIC Blood Culture adequate volume Performed at [facility], [address] [place], [place] [address]    Culture   Final    NO GROWTH 5 DAYS Performed at [facility], [address], [place], [place] [address]    Report Status 12/18/2023 FINAL  Final  Surgical pcr screen     Status: None   Collection Time: 12/13/23  5:35 PM   Specimen: Nasal Mucosa; Nasal Swab  Result Value Ref Range Status   MRSA, PCR NEGATIVE NEGATIVE Final   Staphylococcus aureus NEGATIVE NEGATIVE Final    Comment: (NOTE) The Xpert SA Assay (FDA approved for NASAL specimens in patients 24 years of age and older), is one component of a comprehensive surveillance program. It is not intended to diagnose infection nor to guide or monitor treatment. Performed at [facility], [address] [place], [place] [address]      Radiology Studies: No results found.   Nilda Fendt, MD, PhD [facility]  Between 7 am - 7 pm I am available, please contact me via Amion (for emergencies) or Securechat (non urgent messages)  Between 7 pm - 7 am I am not available, please contact night  coverage MD/APP via Amion

## 2023-12-22 NOTE — Plan of Care (Signed)
  Problem: Clinical Measurements: Goal: Respiratory complications will improve Outcome: Progressing   Problem: Activity: Goal: Risk for activity intolerance will decrease Outcome: Progressing   Problem: Nutrition: Goal: Adequate nutrition will be maintained Outcome: Progressing   Problem: Elimination: Goal: Will not experience complications related to bowel motility Outcome: Progressing

## 2023-12-23 DIAGNOSIS — K631 Perforation of intestine (nontraumatic): Secondary | ICD-10-CM | POA: Diagnosis not present

## 2023-12-23 LAB — BASIC METABOLIC PANEL WITH GFR
Anion gap: 14 (ref 5–15)
BUN: 28 mg/dL — ABNORMAL HIGH (ref 8–23)
CO2: 26 mmol/L (ref 22–32)
Calcium: 9.4 mg/dL (ref 8.9–10.3)
Chloride: 100 mmol/L (ref 98–111)
Creatinine, Ser: 4.44 mg/dL — ABNORMAL HIGH (ref 0.44–1.00)
GFR, Estimated: 10 mL/min — ABNORMAL LOW (ref >60)
Glucose, Bld: 153 mg/dL — ABNORMAL HIGH (ref 70–99)
Potassium: 3.8 mmol/L (ref 3.5–5.1)
Sodium: 140 mmol/L (ref 135–145)

## 2023-12-23 LAB — CBC WITH DIFFERENTIAL/PLATELET
Abs Immature Granulocytes: 0.46 K/uL — ABNORMAL HIGH (ref 0.00–0.07)
Basophils Absolute: 0 K/uL (ref 0.0–0.1)
Basophils Relative: 0 %
Eosinophils Absolute: 0.4 K/uL (ref 0.0–0.5)
Eosinophils Relative: 4 %
HCT: 26.7 % — ABNORMAL LOW (ref 36.0–46.0)
Hemoglobin: 8.7 g/dL — ABNORMAL LOW (ref 12.0–15.0)
Immature Granulocytes: 4 %
Lymphocytes Relative: 11 %
Lymphs Abs: 1.2 K/uL (ref 0.7–4.0)
MCH: 31.1 pg (ref 26.0–34.0)
MCHC: 32.6 g/dL (ref 30.0–36.0)
MCV: 95.4 fL (ref 80.0–100.0)
Monocytes Absolute: 0.8 K/uL (ref 0.1–1.0)
Monocytes Relative: 8 %
Neutro Abs: 7.8 K/uL — ABNORMAL HIGH (ref 1.7–7.7)
Neutrophils Relative %: 73 %
Platelets: 316 K/uL (ref 150–400)
RBC: 2.8 MIL/uL — ABNORMAL LOW (ref 3.87–5.11)
RDW: 15.6 % — ABNORMAL HIGH (ref 11.5–15.5)
WBC: 10.7 K/uL — ABNORMAL HIGH (ref 4.0–10.5)
nRBC: 0 % (ref 0.0–0.2)

## 2023-12-23 MED ORDER — PREGABALIN 25 MG PO CAPS
25.0000 mg | ORAL_CAPSULE | Freq: Every day | ORAL | Status: DC
  Administered 2023-12-23: 25 mg via ORAL
  Filled 2023-12-23: qty 1

## 2023-12-23 MED ORDER — CHLORHEXIDINE GLUCONATE CLOTH 2 % EX PADS: 6.0000 | MEDICATED_PAD | Freq: Every day | CUTANEOUS | Status: DC

## 2023-12-23 MED ORDER — PREGABALIN 25 MG PO CAPS
25.0000 mg | ORAL_CAPSULE | Freq: Every day | ORAL | Status: DC
Start: 2023-12-23 — End: 2023-12-23

## 2023-12-23 MED ORDER — ALBUTEROL SULFATE HFA 108 (90 BASE) MCG/ACT IN AERS
2.0000 | INHALATION_SPRAY | Freq: Two times a day (BID) | RESPIRATORY_TRACT | Status: DC
  Administered 2023-12-24: 2 via RESPIRATORY_TRACT
  Filled 2023-12-23: qty 6.7

## 2023-12-23 MED ORDER — CHLORHEXIDINE GLUCONATE CLOTH 2 % EX PADS
6.0000 | MEDICATED_PAD | Freq: Every day | CUTANEOUS | Status: DC
  Administered 2023-12-24 – 2023-12-26 (×3): 6 via TOPICAL

## 2023-12-23 NOTE — Progress Notes (Signed)
 [facility] Progress Note   Subjective:   Seen and examined patient at bedside.  HD yesterday 3.2L UF.  Safe dispo issues being worked on - likely SNF now.  No new issues reported.  Objective Vitals:   12/22/23 1600 12/22/23 1705 12/22/23 2018 12/23/23 0457  BP: 133/65 (!) 144/60 (!) 145/57 139/66  Pulse: 90 92 97 94  Resp: 20 16 16 17   Temp: 97.7 F (36.5 C) 99.1 F (37.3 C) 98.4 F (36.9 C) 98.3 F (36.8 C)  TempSrc:  Oral Oral Oral  SpO2: 94% 98% 97% 98%  Weight: 72.6 kg     Height:       Physical Exam General: Awake, alert, on RA, NAD ENT: dry MM Heart: S1 and S2; No wheezing, gallops, or rubs Lungs: Clear anteriorly Extremities: No LE edema Dialysis Access: R AVF +t/b  Filed Weights   12/20/23 1223 12/22/23 1229 12/22/23 1600  Weight: 70.3 kg 74.2 kg 72.6 kg    Intake/Output Summary (Last 24 hours) at 12/23/2023 0914 Last data filed at 12/23/2023 0800 Gross per 24 hour  Intake 447 ml  Output 3675 ml  Net -3228 ml     Additional Objective Labs: Basic Metabolic Panel: Recent Labs  Lab 12/18/23 0250 12/19/23 0313 12/20/23 0421 12/22/23 0310 12/23/23 0757  NA 138 136 137 138 140  K 4.4 3.6 3.8 3.6 3.8  CL 96* 98 98 102 100  CO2 21* 26 25 24 26   GLUCOSE 67* 90 91 122* 153*  BUN 76* 37* 52* 45* 28*  CREATININE 8.65* 5.28* 6.80* 5.74* 4.44*  CALCIUM  8.8* 8.7* 8.9 9.0 9.4  PHOS 5.1* 4.0 5.3*  --   --    Liver Function Tests: Recent Labs  Lab 12/18/23 0250 12/19/23 0313 12/20/23 0421  AST  --   --  18  ALT  --   --  15  ALKPHOS  --   --  136*  BILITOT  --   --  0.7  PROT  --   --  5.3*  ALBUMIN  2.0* 1.9* 2.0*   No results for input(s): LIPASE, AMYLASE in the last 168 hours. CBC: Recent Labs  Lab 12/18/23 0250 12/19/23 0313 12/20/23 0421 12/22/23 0310 12/23/23 0757  WBC 16.6* 14.9* 13.2* 9.8 10.7*  NEUTROABS  --   --   --   --  7.8*  HGB 8.2* 8.4* 8.5* 8.0* 8.7*  HCT 25.1* 25.5* 25.5* 24.8* 26.7*  MCV 95.8 94.8  94.8 96.1 95.4  PLT 393 401* 430* 359 316   Blood Culture    Component Value Date/Time   SDES  12/13/2023 1411    BLOOD LEFT ANTECUBITAL Performed at [facility], [address], [place], [place] [address]    [facility]  12/13/2023 1411    BOTTLES DRAWN AEROBIC AND ANAEROBIC Blood Culture adequate volume Performed at [facility], [address], [place], [place] [address]    CULT  12/13/2023 1411    NO GROWTH 5 DAYS Performed at [facility], [address], [place], [place] [address]    REPTSTATUS 12/18/2023 FINAL 12/13/2023 1411    Cardiac Enzymes: No results for input(s): CKTOTAL, CKMB, CKMBINDEX, TROPONINI in the last 168 hours. CBG: No results for input(s): GLUCAP in the last 168 hours. Iron Studies: No results for input(s): IRON, TIBC, TRANSFERRIN, FERRITIN in the last 72 hours. No results found for: INR, PROTIME Studies/Results: No results found.  Medications:     acetaminophen   1,000 mg Oral Q6H  albuterol   1-2 puff Inhalation Q12H   atorvastatin   80 mg Oral q AM   darbepoetin (ARANESP ) injection - DIALYSIS  60 mcg Subcutaneous Q Sat-1800   febuxostat   40 mg Oral q AM   feeding supplement (NEPRO CARB STEADY)  237 mL Oral BID BM   heparin  injection (subcutaneous)  5,000 Units Subcutaneous Q8H   metoprolol  tartrate  25 mg Oral BID   pantoprazole   40 mg Oral Daily   rOPINIRole   0.5 mg Oral BID   sevelamer  carbonate  1,600 mg Oral TID WC    Dialysis Orders: [facility] T[facility] 3h   B350   68.7kg   2K bath  AVF   Heparin  none Last hd 9/02 post wt 69.9kg  Mircera 30 mcg q 4, last 8/26   Home bp meds: Metoprolol  xl 50 hs Lasix  80 mg non hd days  Assessment/Plan:  Perforated bowel due to incarcerated hernia: S/p segmental colon resection with creation of colostomy and incisional hernia repair with vicryl mesh on 9/4. Pip/tazo completed 9/9. Per pmd/ surgery. Wound vac in place ESRD: on HD TTS.  HD next Tues.  BP: bp's acceptable, follow.  Atrial fib: getting metoprolol  25 bid  Volume: euvolemic UF with HD.  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 Follow, transfuse prn.  Will give dose aranesp  as she's been in the 8s consistently 2nd HPTH: Corr Ca up and phos ok. No VDRA. Continue binders.  Dispo:  no transportation to/from HD, PT/OT helping with d/c planning --> SNF likely  Manuelita Barters MD [facility] Pager [phone]

## 2023-12-23 NOTE — Plan of Care (Signed)
  Problem: Activity: Goal: Risk for activity intolerance will decrease Outcome: Progressing   Problem: Nutrition: Goal: Adequate nutrition will be maintained Outcome: Progressing   Problem: Coping: Goal: Level of anxiety will decrease Outcome: Not Progressing   Problem: Elimination: Goal: Will not experience complications related to bowel motility Outcome: Progressing

## 2023-12-23 NOTE — Progress Notes (Signed)
 Mobility Specialist Progress Note:    12/23/23 1232  Mobility  Activity Ambulated with assistance (In hallway)  Level of Assistance Standby assist, set-up cues, supervision of patient - no hands on  Assistive Device Front wheel walker  Distance Ambulated (ft) 137 ft (x2)  Activity Response Tolerated well  Mobility Referral Yes  Mobility visit 1 Mobility  Mobility Specialist Start Time (ACUTE ONLY) 1125  Mobility Specialist Stop Time (ACUTE ONLY) 1139  Mobility Specialist Time Calculation (min) (ACUTE ONLY) 14 min   Received pt in bed and agreeable to mobility. No physical assistance needed. No c/o. Returned to room without fault. Pt left in bed. Personal belongings and call light within reach. All needs met.  Lavanda Pollack Mobility Specialist  Please contact via [employer] or  Rehab Office [phone]

## 2023-12-23 NOTE — Plan of Care (Signed)

## 2023-12-23 NOTE — Progress Notes (Addendum)
 PROGRESS NOTE  Rachel Villarreal FMW:[id number] DOB: 1945/10/23 DOA: 12/13/2023 PCP: Ransom Other, MD   LOS: 10 days   Brief Narrative / Interim history: Patient with PMH of ESRD on TTS, PAF on Eliquis , HFpEF, CAD, HLD, anemia of CKD, breast cancer on left presents to the hospital with complaints of abdominal wound. General surgery was consulted.  Patient underwent exploratory laparotomy with colon resection and colostomy with mesh and ventral hernia.  Subjective / 24h Interval events: Feels somewhat nauseous this morning.  Just worked with PT and she tells me that the therapist will recommend rehab.  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  She is status post exploratory laparotomy with colon resection and creation of colostomy as well as abdominal wall debridement with ventral incisional hernia repair. Has a wound VAC in place now - Continue management per surgery, wound VAC tchanged on 12/21/2023.[name]  She also had ostomy teaching - Status post a course of Zosyn    Active problems ESRD on HD - Currently on HD TTS.  Nephrology consulted and following, HD on Tuesday.   History of SAH - Hospitalized on 8/28 and 8/30. Had a fall with SAH secondary to Eliquis . Currently holding anticoagulation. Sutures removed from patient's head on 12/23/2023.   Paroxysmal A-fib - On Toprol -XL.  50 mg daily.  Currently on Lopressor  25 mg twice daily. Eliquis  on hold after her recent [facility].   History of CAD - No active complaints.  Not on any antiplatelet medication. Monitor.   Scalp laceration - Suture placed on 8/28.  Removed 9/11   Restless leg syndrome - The patient states that the Requip  is no longer working for her. 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 Have discontinued ropinorole and started lyrica  at 75 mg  daily.   GERD - Continue Protonix .   History of breast cancer - On anastrozole .   Chronic insomnia - Continue Ambien  nightly.   Scheduled Meds:  acetaminophen   1,000 mg Oral Q6H   albuterol   1-2 puff Inhalation Q12H   atorvastatin   80 mg Oral q AM   darbepoetin (ARANESP ) injection - DIALYSIS  60 mcg Subcutaneous Q Sat-1800   febuxostat   40 mg Oral q AM   feeding supplement (NEPRO CARB STEADY)  237 mL Oral BID BM   heparin  injection (subcutaneous)  5,000 Units Subcutaneous Q8H   metoprolol  tartrate  25 mg Oral BID   pantoprazole   40 mg Oral Daily   pregabalin   25 mg Oral QHS   sevelamer  carbonate  1,600 mg Oral TID WC      PRN Meds:.albuterol , HYDROmorphone  (DILAUDID ) injection, methocarbamol , ondansetron  (ZOFRAN ) IV, traMADol , zolpidem   Current Outpatient Medications  Medication Instructions   acetaminophen  (TYLENOL ) 500 mg, Oral, Every 6 hours PRN   anastrozole  (ARIMIDEX ) 1 mg, Oral, Daily   atorvastatin  (LIPITOR ) 80 mg, Oral, Every morning   budesonide  (RHINOCORT  AQUA) 32 MCG/ACT nasal spray 2 sprays, Each Nare, Every morning   Cholecalciferol 2,000 Units, Oral, Every morning   diclofenac  Sodium (VOLTAREN ) 1 % GEL 1 Application, Topical, 4 times daily PRN   docusate sodium  (COLACE) 100 mg, Oral, Daily PRN   doxycycline  (VIBRA -TABS) 100 mg, Oral, 2 times daily   eszopiclone (LUNESTA) 2 mg, Oral, Daily at bedtime   febuxostat  (ULORIC ) 40 mg, Oral, Every morning   furosemide  (LASIX ) 80 mg, Oral,  See admin instructions, Take 1 tablet by mouth on non-dialysis days, Mon, Wed, Fri, Sun   loratadine  (CLARITIN ) 10 mg, Oral, 2 times daily   Methoxy PEG-Epoetin  Beta (MIRCERA IJ) 60 mcg   metoprolol  succinate (TOPROL -XL) 100 MG 24 hr tablet Take 0.5-1 tablet (50-100 mg total) by mouth daily as needed for elevated heart rate. Take with or immediately following a meal.   Multiple Vitamins-Minerals (PRESERVISION AREDS 2+MULTI VIT PO) 1 capsule, 2 times daily   ondansetron  (ZOFRAN ) 4 mg,  See admin instructions   pantoprazole  (PROTONIX ) 40 mg, Oral, Daily   PROAIR  HFA 108 (90 BASE) MCG/ACT inhaler 1-2 puffs, Every 6 hours PRN   rOPINIRole  (REQUIP ) 0.5 mg, Oral, Every 12 hours PRN   sevelamer  carbonate (RENVELA ) 1,600 mg, 3 times daily with meals    Diet Orders (From admission, onward)     Start     Ordered   12/16/23 0802  DIET SOFT Room service appropriate? Yes; Fluid consistency: Thin  Diet effective now       Question Answer Comment  Room service appropriate? Yes   Fluid consistency: Thin      12/16/23 0803            DVT prophylaxis: heparin  injection 5,000 Units Start: 12/14/23 1400 SCDs Start: 12/14/23 0336   Lab Results  Component Value Date   PLT 316 12/23/2023      Code Status: Full Code  Family Communication: no family at bedside   Status is: Inpatient Remains inpatient appropriate because: severity of illness  Level of care: Telemetry Surgical  Consultants:  General surgery   Objective: Vitals:   12/22/23 1705 12/22/23 2018 12/23/23 0457 12/23/23 1535  BP: (!) 144/60 (!) 145/57 139/66 (!) 130/57  Pulse: 92 97 94 84  Resp: 16 16 17 18   Temp: 99.1 F (37.3 C) 98.4 F (36.9 C) 98.3 F (36.8 C) 98.2 F (36.8 C)  TempSrc: Oral Oral Oral Oral  SpO2: 98% 97% 98% 97%  Weight:      Height:        Intake/Output Summary (Last 24 hours) at 12/23/2023 1652 Last data filed at 12/23/2023 9077 Gross per 24 hour  Intake 337 ml  Output 575 ml  Net -238 ml   Wt Readings from Last 3 Encounters:  12/22/23 72.6 kg  12/07/23 68 kg  11/29/23 68 kg    Examination:  Exam:  Constitutional:  The patient is awake, alert, and oriented x 3. No acute distress. Respiratory:  No increased work of breathing. No wheezes, rales, or rhonchi No tactile fremitus Cardiovascular:  Regular rate and rhythm No murmurs, ectopy, or gallups. No lateral PMI. No thrills. Abdomen:  Abdomen is soft, non-tender, non-distended No hernias, masses, or  organomegaly Normoactive bowel sounds.  Musculoskeletal:  No cyanosis, clubbing, or edema Skin:  No rashes, lesions, ulcers palpation of skin: no induration or nodules Neurologic:  CN 2-12 intact Sensation all 4 extremities intact Psychiatric:  Mental status Mood, affect appropriate Orientation to person, place, time  judgment and insight appear intact    Data Reviewed: I have independently reviewed following labs and imaging studies   CBC Recent Labs  Lab 12/18/23 0250 12/19/23 0313 12/20/23 0421 12/22/23 0310 12/23/23 0757  WBC 16.6* 14.9* 13.2* 9.8 10.7*  HGB 8.2* 8.4* 8.5* 8.0* 8.7*  HCT 25.1* 25.5* 25.5* 24.8* 26.7*  PLT 393 401* 430* 359 316  MCV 95.8 94.8 94.8 96.1 95.4  MCH 31.3 31.2 31.6 31.0 31.1  MCHC 32.7 32.9 33.3 32.3  32.6  RDW 15.3 15.3 15.4 15.8* 15.6*  LYMPHSABS  --   --   --   --  1.2  MONOABS  --   --   --   --  0.8  EOSABS  --   --   --   --  0.4  BASOSABS  --   --   --   --  0.0    Recent Labs  Lab 12/17/23 0326 12/18/23 0250 12/19/23 0313 12/20/23 0421 12/22/23 0310 12/23/23 0757  NA 137 138 136 137 138 140  K 3.9 4.4 3.6 3.8 3.6 3.8  CL 97* 96* 98 98 102 100  CO2 26 21* 26 25 24 26   GLUCOSE 95 67* 90 91 122* 153*  BUN 66* 76* 37* 52* 45* 28*  CREATININE 7.49* 8.65* 5.28* 6.80* 5.74* 4.44*  CALCIUM  8.5* 8.8* 8.7* 8.9 9.0 9.4  AST  --   --   --  18  --   --   ALT  --   --   --  15  --   --   ALKPHOS  --   --   --  136*  --   --   BILITOT  --   --   --  0.7  --   --   ALBUMIN  2.1* 2.0* 1.9* 2.0*  --   --   MG 2.0 2.0 1.8 1.9 1.8  --     ------------------------------------------------------------------------------------------------------------------ No results for input(s): CHOL, HDL, LDLCALC, TRIG, CHOLHDL, LDLDIRECT in the last 72 hours.  Lab Results  Component Value Date   HGBA1C 5.2 04/17/2023    ------------------------------------------------------------------------------------------------------------------ No results for input(s): TSH, T4TOTAL, T3FREE, THYROIDAB in the last 72 hours.  Invalid input(s): FREET3  Cardiac Enzymes No results for input(s): CKMB, TROPONINI, MYOGLOBIN in the last 168 hours.  Invalid input(s): CK ------------------------------------------------------------------------------------------------------------------    Component Value Date/Time   BNP 1,710.2 (H) 10/23/2023 0222    CBG: No results for input(s): GLUCAP in the last 168 hours.  Recent Results (from the past 240 hours)  Surgical pcr screen     Status: None   Collection Time: 12/13/23  5:35 PM   Specimen: Nasal Mucosa; Nasal Swab  Result Value Ref Range Status   MRSA, PCR NEGATIVE NEGATIVE Final   Staphylococcus aureus NEGATIVE NEGATIVE Final    Comment: (NOTE) The Xpert SA Assay (FDA approved for NASAL specimens in patients 12 years of age and older), is one component of a comprehensive surveillance program. It is not intended to diagnose infection nor to guide or monitor treatment. Performed at [facility], [address], [place], [place] [address]      Radiology Studies: No results found.   Brigida Bureau, DO  [facility] [phone] 4:55 PM  Between 7 am - 7 pm I am available, please contact me via [employer] (for emergencies) or [employer] (non urgent messages)  Between 7 pm - 7 am I am not available, please contact night coverage MD/APP via [employer]

## 2023-12-24 DIAGNOSIS — K631 Perforation of intestine (nontraumatic): Secondary | ICD-10-CM | POA: Diagnosis not present

## 2023-12-24 LAB — FERRITIN: Ferritin: 1450 ng/mL — ABNORMAL HIGH (ref 11–307)

## 2023-12-24 LAB — BASIC METABOLIC PANEL WITH GFR
Anion gap: 15 (ref 5–15)
BUN: 42 mg/dL — ABNORMAL HIGH (ref 8–23)
CO2: 22 mmol/L (ref 22–32)
Calcium: 9.3 mg/dL (ref 8.9–10.3)
Chloride: 104 mmol/L (ref 98–111)
Creatinine, Ser: 5.75 mg/dL — ABNORMAL HIGH (ref 0.44–1.00)
GFR, Estimated: 7 mL/min — ABNORMAL LOW (ref >60)
Glucose, Bld: 93 mg/dL (ref 70–99)
Potassium: 4.5 mmol/L (ref 3.5–5.1)
Sodium: 141 mmol/L (ref 135–145)

## 2023-12-24 MED ORDER — CHLORHEXIDINE GLUCONATE CLOTH 2 % EX PADS
6.0000 | MEDICATED_PAD | Freq: Every day | CUTANEOUS | Status: DC
  Administered 2023-12-25 – 2023-12-26 (×2): 6 via TOPICAL

## 2023-12-24 MED ORDER — ROPINIROLE HCL 0.5 MG PO TABS
0.5000 mg | ORAL_TABLET | Freq: Two times a day (BID) | ORAL | Status: DC
  Administered 2023-12-24 – 2023-12-28 (×9): 0.5 mg via ORAL
  Filled 2023-12-24 (×9): qty 1

## 2023-12-24 MED ORDER — POLYVINYL ALCOHOL 1.4 % OP SOLN
1.0000 [drp] | OPHTHALMIC | Status: DC | PRN
  Administered 2023-12-24 – 2023-12-27 (×2): 1 [drp] via OPHTHALMIC
  Filled 2023-12-24: qty 15

## 2023-12-24 NOTE — Consult Note (Addendum)
 WOC Consult to place new VAC to mid-abd; foam exchange Ostomy had leaked into wound Cleansed wound with normal saline/gauze several times Applied cut barrier ring adjacent to ostomy appliance for better seal Filled wound with  0 piece of Mepitel, 0 piece of white foam, 01 piece of black foam into oddly shaped wound; 09 x 6 x 4 cm Sealed NPWT dressing at HG continuous  Patient did not require pain meds. Wound contracting, less depth today. Patient tolerated procedure well without pain medication, patient stated the dressing felt tight but upon leaving room she said that the feeling was going away. VAC unit was plugged in and functioning well- no alarms. Report given to nurse tech, assisted patient in changing out panties and will need new abd binder.  WOC Nurse ostomy follow up Continued ostomy teaching and management, appliance leaking on both sides, 3 'o clock and 9 'o clock Stoma type/location: RUQ, end transverse colostomy Stomal assessment/size: 35 mm round, viable, pink, moist, well-budded Peristomal assessment: intact plane, no erythema Treatment options for stomal/peristomal skin:  Output 300 mls liquid brownish effluent Ostomy pouching: 1-piece flat # 725, Barrier ring # [id number]  Education provided: frequency of change out, bathing, skin care, when to empty pouch, use of barrier rings.    WOC to do [facility] therapy on Tues and Fridays, discharge plans pending, possible LTAC placement to continue Rehab.     WOC nurse will continue to provide NPWT dressing changed due to the complexity of the dressing change. Coordinated care with primary nurse(s), VAC placed once patient returned from HD. 12/24/23 Abd    Please reconsult if wound worsens in condition and notify provider. Nursing staff to follow [facility] policy and procedure.   Sherrilyn Hals MSN RN CWOCN WOC [facility]  [phone] (Available from 7-3 pm Mon-Friday)

## 2023-12-24 NOTE — Progress Notes (Signed)
  [facility] Progress Note   Subjective:    Seen in room No c/o's    Objective Vitals:   12/23/23 2100 12/24/23 0426 12/24/23 0726 12/24/23 0844  BP:  (!) 140/72  (!) 143/56  Pulse:  93  93  Resp: 18 16  17   Temp: 98.9 F (37.2 C) 98.2 F (36.8 C)  98.1 F (36.7 C)  TempSrc: Oral Oral    SpO2: 100% 98% 95% 98%  Weight:      Height:       Physical Exam General: Awake, alert, on RA, NAD ENT: dry MM Heart: S1 and S2; No wheezing, gallops, or rubs Abd: ostomy in place, nontender abd Lungs: Clear anteriorly Extremities: no edema bilat  Dialysis Access: R AVF +t/b   OP HD: [facility] TTS 3h   B350   68.7kg   2K bath  AVF   Heparin  none Last hd 9/02 post wt 69.9kg  Mircera 30 mcg q 4, last 8/26   Home bp meds: Metoprolol  xl 50 hs Lasix  80 mg non hd days  Assessment/Plan:  Perforated bowel due to incarcerated hernia: S/p segmental colon resection with creation of colostomy and incisional hernia repair with vicryl mesh on 9/4. Pip/tazo completed 9/9. Per pmd/ surgery. Wound vac in place ESRD: on HD TTS.  Next HD Tuesday.  BP: bp's acceptable, follow.  Atrial fib: getting metoprolol  25 bid  Volume: up 2-2.5 kg. Plan UF 2- 2.5 L next HD Anemia of esrd: Hb 8- 10 here. Follow, transfuse prn.  Will give dose aranesp  as she's been in the 8s consistently 2nd HPTH: Corr Ca up and phos ok. No VDRA. Continue binders.  Myer Fret  MD  [facility] 12/24/2023, 3:59 PM  Recent Labs  Lab 12/19/23 0313 12/20/23 0421 12/22/23 0310 12/23/23 0757 12/24/23 0245  HGB 8.4* 8.5* 8.0* 8.7*  --   ALBUMIN  1.9* 2.0*  --   --   --   CALCIUM  8.7* 8.9 9.0 9.4 9.3  PHOS 4.0 5.3*  --   --   --   CREATININE 5.28* 6.80* 5.74* 4.44* 5.75*  K 3.6 3.8 3.6 3.8 4.5    Inpatient medications:  acetaminophen   1,000 mg Oral Q6H   atorvastatin   80 mg Oral q AM   Chlorhexidine  Gluconate Cloth  6 each Topical Daily   darbepoetin (ARANESP ) injection - DIALYSIS  60 mcg Subcutaneous Q Sat-1800    febuxostat   40 mg Oral q AM   feeding supplement (NEPRO CARB STEADY)  237 mL Oral BID BM   heparin  injection (subcutaneous)  5,000 Units Subcutaneous Q8H   metoprolol  tartrate  25 mg Oral BID   pantoprazole   40 mg Oral Daily   rOPINIRole   0.5 mg Oral BID   sevelamer  carbonate  1,600 mg Oral TID WC    albuterol , artificial tears, HYDROmorphone  (DILAUDID ) injection, methocarbamol , ondansetron  (ZOFRAN ) IV, traMADol , zolpidem

## 2023-12-24 NOTE — Plan of Care (Signed)
  Problem: Health Behavior/Discharge Planning: Goal: Ability to manage health-related needs will improve Outcome: Progressing   Problem: Clinical Measurements: Goal: Ability to maintain clinical measurements within normal limits will improve Outcome: Progressing   Problem: Activity: Goal: Risk for activity intolerance will decrease Outcome: Progressing   Problem: Nutrition: Goal: Adequate nutrition will be maintained Outcome: Progressing   Problem: Elimination: Goal: Will not experience complications related to bowel motility Outcome: Progressing   Problem: Pain Managment: Goal: General experience of comfort will improve and/or be controlled Outcome: Progressing

## 2023-12-24 NOTE — Progress Notes (Signed)
 PROGRESS NOTE  Rachel Villarreal  DOB: [birth year]  PCP: Ransom Other, MD FMW:[id number]  DOA: 12/13/2023  LOS: 11 days  Hospital Day: 12  Brief narrative: Rachel Villarreal is a 78 y.o. 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  About a week prior, she noticed some redness over the hernia, went to her PCP and was started on doxycycline .  The day prior to presentation, she noticed that the area had blackened and in the next 24 hours, started to drain pus hence he came to the ED on 9/4. CT abdomen pelvis showed left paracentral umbilical hernia containing a knuckle of transverse colon or a Richter hernia with surrounding abnormal extraluminal gas in the hernia sac indicating perforated viscus.  Seen by general surgery, diagnosed to have incarcerated ventral incisional hernia with colonic perforation. Underwent emergency exploratory laparotomy with segmental transverse colon resection, creation of end transverse colostomy, abdominal wall debridement, ventral incisional hernia repair. Postop, admitted to [facility]  Subjective: Patient was seen and examined this morning.  Pleasant elderly Caucasian female.  Comfortable in bed.  Not in distress. 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 Leonor 9/4 Completed course of IV Zosyn  Postop ileus improved Bowel function returned.  Ostomy bag with greenish output. Currently has a wound VAC in place now Continue management per surgery, wound VAC tchanged on 12/21/2023.Rachel Villarreal  She also had ostomy teaching   ESRD-HD-TTS   Nephrology following Diuretics on hold   H/o SAH -  Had a fall with SAH secondary to Eliquis . Hospitalized 8/28 -8/30 under neurosurgery Currently anticoagulation on hold.  Sutures removed from patient's head on 12/23/2023.   Paroxysmal A-fib  Currently on Lopressor  25 mg twice daily.  Eliquis  on hold after her recent [facility].   H/o CAD, HLD No anginal symptoms  Not on any blood thinner  Continue Lipitor    Restless leg syndrome  Chronic insomnia On Requip  at home.  Patient wanted to try to switch to Lyrica  yesterday but states today that it did not relieve her last night.  Wants to switch back to her Requip .  Also obtain ferritin level Also on Ambien  nightly   GERD - Continue Protonix .   H/o breast cancer  On anastrozole .  H/o gout Continue Uloric    Mobility:  PT Orders: Active   PT Follow up Rec: Skilled Nursing-Short Term Rehab (<3 Hours/Day)12/24/2023 1030   Goals of care   Code Status: Full Code     DVT prophylaxis:  heparin  injection 5,000 Units Start: 12/14/23 1400 SCDs Start: 12/14/23 0336   Antimicrobials: None Fluid: None Consultants: Nephrology Family Communication: None at bedside  Status: Inpatient Level of care:  Telemetry Surgical   Patient is from: Home Needs to continue in-hospital care: Pending SNF    Diet:  Diet Order             DIET SOFT Room service appropriate? Yes; Fluid consistency: Thin  Diet effective now                   Scheduled Meds:  acetaminophen   1,000 mg Oral Q6H  atorvastatin   80 mg Oral q AM   Chlorhexidine  Gluconate Cloth  6 each Topical Daily   darbepoetin (ARANESP ) injection - DIALYSIS  60 mcg Subcutaneous Q Sat-1800   febuxostat   40 mg Oral q AM   feeding supplement (NEPRO CARB STEADY)  237 mL Oral BID BM   heparin  injection (subcutaneous)  5,000 Units Subcutaneous Q8H   metoprolol  tartrate  25 mg Oral BID   pantoprazole   40 mg Oral Daily   rOPINIRole   0.5 mg Oral BID   sevelamer  carbonate  1,600  mg Oral TID WC    PRN meds: albuterol , artificial tears, HYDROmorphone  (DILAUDID ) injection, methocarbamol , ondansetron  (ZOFRAN ) IV, traMADol , zolpidem    Infusions:    Antimicrobials: Anti-infectives (From admission, onward)    Start     Dose/Rate Route Frequency Ordered Stop   12/13/23 2200  piperacillin -tazobactam (ZOSYN ) IVPB 2.25 g        2.25 g 100 mL/hr over 30 Minutes Intravenous Every 8 hours 12/13/23 2113 12/19/23 0847   12/13/23 2111  piperacillin -tazobactam (ZOSYN ) 3.375 GM/50ML IVPB       Note to Pharmacy: Golob, Jamie C: cabinet override      12/13/23 2111 12/14/23 0914   12/13/23 1415  piperacillin -tazobactam (ZOSYN ) IVPB 3.375 g        3.375 g 100 mL/hr over 30 Minutes Intravenous  Once 12/13/23 1413 12/13/23 1653       Objective: Vitals:   12/24/23 0726 12/24/23 0844  BP:  (!) 143/56  Pulse:  93  Resp:  17  Temp:  98.1 F (36.7 C)  SpO2: 95% 98%    Intake/Output Summary (Last 24 hours) at 12/24/2023 1452 Last data filed at 12/24/2023 0857 Gross per 24 hour  Intake 580 ml  Output 600 ml  Net -20 ml   Filed Weights   12/20/23 1223 12/22/23 1229 12/22/23 1600  Weight: 70.3 kg 74.2 kg 72.6 kg   Weight change:  Body mass index is 29.27 kg/m.   Physical Exam: General exam: Pleasant, elderly Caucasian female. Skin: No rashes, lesions or ulcers. HEENT: Atraumatic, normocephalic, no obvious bleeding Lungs: Clear to auscultation bilaterally,  CVS: S1, S2, no murmur,   GI/Abd: Soft, nontender, nondistended, bowel sound present, ostomy with greenish output CNS: Alert, awake, oriented x 3 Psychiatry: Mood appropriate Extremities: No pedal edema, no calf tenderness,   Data Review: I have personally reviewed the laboratory data and studies available.  F/u labs ordered Unresulted Labs (From admission, onward)     Start     Ordered   12/24/23 1453  Ferritin  Add-on,   AD        12/24/23 1452            Signed, Chapman Rota, MD [facility] 12/24/2023

## 2023-12-24 NOTE — Progress Notes (Signed)
 Physical Therapy Treatment Patient Details Name: Rachel Villarreal MRN: [id number] DOB: February 17, 1946 Today's Date: 12/24/2023   History of Present Illness 78 y.o. female admitted on 12/13/23 s/p ex lap with segmental TC resection with end colostomy, abdominal wall debridement, and ventral incisional hernia repair with vicryl mesh, Dr. Dasie 12/13/2023 for transverse colon perforation within an incarcerated ventral hernia  PMH: HTN, ESRD on HD, breast cancer, CAD, HFimpEF, PAF on eliquis .    PT Comments  Pt progressing well with mobility. Supervision bed mobility, transfers, and amb 225' with RW. Pt returned to bed at end of session.    If plan is discharge home, recommend the following: Assist for transportation;A little help with walking and/or transfers;A little help with bathing/dressing/bathroom;Help with stairs or ramp for entrance;Assistance with cooking/housework   Can travel by private vehicle     Yes  Equipment Recommendations  None recommended by PT    Recommendations for Other Services       Precautions / Restrictions Precautions Precautions: Fall;Other (comment) Precaution/Restrictions Comments: abdominal precautions/wound vac, ostomy Required Braces or Orthoses: Other Brace Other Brace: abdominal binder     Mobility  Bed Mobility Overal bed mobility: Needs Assistance Bed Mobility: Sit to Supine, Supine to Sit     Supine to sit: Supervision Sit to supine: Supervision   General bed mobility comments: assist for lines    Transfers Overall transfer level: Needs assistance Equipment used: Rolling walker (2 wheels) Transfers: Sit to/from Stand Sit to Stand: Supervision           General transfer comment: supervision for safety and wound vac line    Ambulation/Gait Ambulation/Gait assistance: Supervision Gait Distance (Feet): 225 Feet Assistive device: Rolling walker (2 wheels) Gait Pattern/deviations: Step-through pattern, Decreased stride length Gait  velocity: decreased Gait velocity interpretation: 1.31 - 2.62 ft/sec, indicative of limited Financial controller     Tilt Bed    Modified Rankin (Stroke Patients Only)       Balance Overall balance assessment: Mild deficits observed, not formally tested                                          Communication Communication Communication: No apparent difficulties  Cognition Arousal: Alert Behavior During Therapy: WFL for tasks assessed/performed   PT - Cognitive impairments: No apparent impairments                         Following commands: Intact      Cueing Cueing Techniques: Verbal cues  Exercises      General Comments General comments (skin integrity, edema, etc.): VSS on RA      Pertinent Vitals/Pain Pain Assessment Pain Assessment: No/denies pain    Home Living                          Prior Function            PT Goals (current goals can now be found in the care plan section) Acute Rehab PT Goals Patient Stated Goal: home Progress towards PT goals: Progressing toward goals    Frequency    Min 2X/week      PT Plan      Co-evaluation  AM-PAC PT 6 Clicks Mobility   Outcome Measure  Help needed turning from your back to your side while in a flat bed without using bedrails?: None Help needed moving from lying on your back to sitting on the side of a flat bed without using bedrails?: None Help needed moving to and from a bed to a chair (including a wheelchair)?: A Little Help needed standing up from a chair using your arms (e.g., wheelchair or bedside chair)?: A Little Help needed to walk in hospital room?: A Little Help needed climbing 3-5 steps with a railing? : A Lot 6 Click Score: 19    End of Session Equipment Utilized During Treatment: Gait belt Activity Tolerance: Patient tolerated treatment well Patient left: in bed;with  call bell/phone within reach Nurse Communication: Mobility status PT Visit Diagnosis: Other abnormalities of gait and mobility (R26.89);Unsteadiness on feet (R26.81)     Time: 9080-9064 PT Time Calculation (min) (ACUTE ONLY): 16 min  Charges:    $Gait Training: 8-22 mins PT General Charges $$ ACUTE PT VISIT: 1 Visit                     Sari MATSU., PT  Office # [phone]    Erven Sari Shaker 12/24/2023, 10:35 AM

## 2023-12-25 DIAGNOSIS — K631 Perforation of intestine (nontraumatic): Secondary | ICD-10-CM | POA: Diagnosis not present

## 2023-12-25 MED ORDER — LIDOCAINE-PRILOCAINE 2.5-2.5 % EX CREA: 1.0000 | TOPICAL_CREAM | CUTANEOUS | Status: DC | PRN

## 2023-12-25 MED ORDER — ANTICOAGULANT SODIUM CITRATE 4% (200MG/5ML) IV SOLN: 5.0000 mL | Status: DC | PRN

## 2023-12-25 MED ORDER — LIDOCAINE HCL (PF) 1 % IJ SOLN: 5.0000 mL | INTRAMUSCULAR | Status: DC | PRN

## 2023-12-25 MED ORDER — ALTEPLASE 2 MG IJ SOLR: 2.0000 mg | Freq: Once | INTRAMUSCULAR | Status: DC | PRN

## 2023-12-25 MED ORDER — NEPRO/CARBSTEADY PO LIQD: 237.0000 mL | ORAL | Status: DC | PRN

## 2023-12-25 MED ORDER — METOPROLOL TARTRATE 12.5 MG HALF TABLET
12.5000 mg | ORAL_TABLET | Freq: Two times a day (BID) | ORAL | Status: DC
Start: 2023-12-25 — End: 2023-12-28
  Administered 2023-12-25 – 2023-12-28 (×6): 12.5 mg via ORAL
  Filled 2023-12-25 (×6): qty 1

## 2023-12-25 MED ORDER — LOPERAMIDE HCL 2 MG PO CAPS
2.0000 mg | ORAL_CAPSULE | Freq: Every day | ORAL | Status: AC
  Administered 2023-12-25: 2 mg via ORAL
  Filled 2023-12-25: qty 1

## 2023-12-25 MED ORDER — PENTAFLUOROPROP-TETRAFLUOROETH EX AERO: 1.0000 | INHALATION_SPRAY | CUTANEOUS | Status: DC | PRN

## 2023-12-25 MED ORDER — HEPARIN SODIUM (PORCINE) 1000 UNIT/ML DIALYSIS: 1000.0000 [IU] | INTRAMUSCULAR | Status: DC | PRN

## 2023-12-25 NOTE — Progress Notes (Signed)
 Patient complained of SOB and wheezing. O2 at 100% on room air, expiratory wheezing heard bilaterally. PRN albuterol  tx administered. Tx tolerated well and medication was effective.

## 2023-12-25 NOTE — Progress Notes (Signed)
  Received patient in bed to unit.   Informed consent signed and in chart.    TX duration:3.0     Transported by  Hand-off given to patient's nurse.    Access used: Rt AVF Access issues: None   Total UF removed:  Medication(s) given:0.8    Hunter Hacking LPN [facility]

## 2023-12-25 NOTE — Anesthesia Postprocedure Evaluation (Signed)
 Anesthesia Post Note  Patient: Rachel Villarreal  Procedure(s) Performed: LAPAROTOMY, EXPLORATORY; COLON RESECTION CREATION, COLOSTOMY (Abdomen)     Patient location during evaluation: PACU Anesthesia Type: General Level of consciousness: awake and alert Pain management: pain level controlled Vital Signs Assessment: post-procedure vital signs reviewed and stable Respiratory status: spontaneous breathing, nonlabored ventilation, respiratory function stable and patient connected to nasal cannula oxygen Cardiovascular status: blood pressure returned to baseline and stable Postop Assessment: no apparent nausea or vomiting Anesthetic complications: no   No notable events documented.  Last Vitals:  Vitals:   12/25/23 0930 12/25/23 1000  BP: (!) 102/50 (!) 109/54  Pulse: (!) 101 96  Resp: (!) 26 (!) 24  Temp:    SpO2: 97% 98%    Last Pain:  Vitals:   12/25/23 0802  TempSrc:   PainSc: 0-No pain                 Cambryn Charters S

## 2023-12-25 NOTE — TOC Progression Note (Signed)
 Transition of Care [facility]) - Progression Note    Patient Details  Name: Rachel Villarreal MRN: [id number] Date of Birth: 12-Jun-1945  Transition of Care [facility]) CM/SW Contact  Mailani Degroote LITTIE Moose, [place] Phone Number: 12/25/2023, 9:49 AM  Clinical Narrative:    CSW initiated insurance process for [employer]. Ref #[id number], [facility] can accept pt pending insurance approval. CSW will continue to follow.    Expected Discharge Plan: Skilled Nursing Facility Barriers to Discharge: Continued Medical Work up, SNF Pending bed offer, English as a second language teacher               Expected Discharge Plan and Services   Discharge Planning Services: CM Consult Post Acute Care Choice: Home Health Living arrangements for the past 2 months: Single Family Home                 DME Arranged: N/A         HH Arranged:  (see note)           Social Drivers of Health (SDOH) Interventions SDOH Screenings   Food Insecurity: No Food Insecurity (12/13/2023)  Housing: Low Risk  (12/13/2023)  Transportation Needs: No Transportation Needs (12/13/2023)  Utilities: Not At Risk (12/13/2023)  Depression (PHQ2-9): Low Risk  (02/28/2022)  Financial Resource Strain: Low Risk  (09/02/2021)  Social Connections: Moderately Integrated (12/13/2023)  Stress: No Stress Concern Present (09/02/2021)  Tobacco Use: Medium Risk (12/13/2023)    Readmission Risk Interventions     No data to display

## 2023-12-25 NOTE — Procedures (Addendum)
 S: pt seen in HD unit this am, doing well , no c/o's  Vitals:   12/25/23 1000 12/25/23 1030 12/25/23 1122 12/25/23 1626  BP: (!) 109/54 116/60 (!) 144/67 (!) 127/51  Pulse: 96 99 (!) 102 91  Resp: (!) 24 (!) 21 (!) 25 18  Temp:   98.7 F (37.1 C)   TempSrc:   Oral   SpO2: 98% 96% 96% 97%  Weight:      Height:        Recent Labs  Lab 12/19/23 0313 12/20/23 0421 12/22/23 0310 12/23/23 0757 12/24/23 0245  HGB 8.4* 8.5* 8.0* 8.7*  --   ALBUMIN  1.9* 2.0*  --   --   --   CALCIUM  8.7* 8.9 9.0 9.4 9.3  PHOS 4.0 5.3*  --   --   --   CREATININE 5.28* 6.80* 5.74* 4.44* 5.75*  K 3.6 3.8 3.6 3.8 4.5    Inpatient medications:  acetaminophen   1,000 mg Oral Q6H   atorvastatin   80 mg Oral q AM   Chlorhexidine  Gluconate Cloth  6 each Topical Daily   Chlorhexidine  Gluconate Cloth  6 each Topical Q0600   darbepoetin (ARANESP ) injection - DIALYSIS  60 mcg Subcutaneous Q Sat-1800   febuxostat   40 mg Oral q AM   feeding supplement (NEPRO CARB STEADY)  237 mL Oral BID BM   heparin  injection (subcutaneous)  5,000 Units Subcutaneous Q8H   loperamide   2 mg Oral Daily   metoprolol  tartrate  12.5 mg Oral BID   pantoprazole   40 mg Oral Daily   rOPINIRole   0.5 mg Oral BID   sevelamer  carbonate  1,600 mg Oral TID WC    albuterol , artificial tears, HYDROmorphone  (DILAUDID ) injection, methocarbamol , ondansetron  (ZOFRAN ) IV, traMADol , zolpidem   I was present at the procedure, reviewed the HD regimen and made appropriate changes.   Myer Fret MD  [facility] 12/25/2023, 4:40 PM

## 2023-12-25 NOTE — Progress Notes (Signed)
 PROGRESS NOTE  Rachel Villarreal  DOB: 12-24-1945  PCP: Ransom Other, MD FMW:[id number]  DOA: 12/13/2023  LOS: 12 days  Hospital Day: 13  Brief narrative: Rachel Villarreal is a 78 y.o. 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  About a week prior, she noticed some redness over the hernia, went to her PCP and was started on doxycycline .  The day prior to presentation, she noticed that the area had blackened and in the next 24 hours, started to drain pus hence he came to the ED on 9/4. CT abdomen pelvis showed left paracentral umbilical hernia containing a knuckle of transverse colon or a Richter hernia with surrounding abnormal extraluminal gas in the hernia sac indicating perforated viscus.  Seen by general surgery, diagnosed to have incarcerated ventral incisional hernia with colonic perforation. Underwent emergency exploratory laparotomy with segmental transverse colon resection, creation of end transverse colostomy, abdominal wall debridement, ventral incisional hernia repair. Postop, admitted to [facility]  Subjective: Patient was seen and examined this morning at dialysis Pleasant elderly Caucasian female.  Not in distress. Has a total majored colostomy output of 650 mL in 24 hours Per dialysis nurse, her blood pressure was soft-no fluid being removed, only getting ultrafiltration.  Assessment and plan: Ventral incisional hernia with skin necrosis secondary to bowel perforation S/p ex lap, colon resection, colostomy, right mid and ventral hernia repair - Dr. Leonor 9/4 Completed course of IV Zosyn  Postop ileus improved Bowel function returned.  Colostomy bag with greenish output. Has a total measured colostomy output of 650 mL in 24 hours.  I will add Imodium  2 mg daily for the next 3 days.  Continue to monitor Currently has a  wound VAC in place now Continue management per surgery, wound VAC tchanged on 12/21/2023.[name]  She also had ostomy teaching   ESRD-HD-TTS  Nephrology following Diuretics on hold   H/o SAH -  Had a fall with SAH secondary to Eliquis . Hospitalized 8/28 -8/30 under neurosurgery Currently anticoagulation on hold.  Sutures removed from patient's head on 12/23/2023.   Paroxysmal A-fib  Currently on Lopressor  25 mg twice daily.  Given soft blood pressure and colostomy loss, I would reduce Lopressor  to 12.5 mg twice daily. Eliquis  on hold after her recent [facility].   H/o CAD, HLD No anginal symptoms  Not on any blood thinner  Continue Lipitor    Restless leg syndrome  Chronic insomnia On Requip  at home.  Patient wanted to try to switch to Lyrica  yesterday but states today that it did not relieve her last night.  Wants to switch back to her Requip .  Also obtain ferritin level Also on Ambien  nightly   GERD - Continue Protonix .   H/o breast cancer  On anastrozole .  H/o gout Continue Uloric    Mobility:  PT Orders: Active   PT Follow up Rec: Skilled Nursing-Short Term Rehab (<3 Hours/Day)12/24/2023 1030   Goals of care   Code Status: Full Code     DVT prophylaxis:  heparin  injection 5,000 Units Start: 12/14/23 1400 SCDs Start: 12/14/23 0336   Antimicrobials: None Fluid: None Consultants: Nephrology Family Communication: None at bedside  Status: Inpatient Level of care:  Telemetry Surgical   Patient is from: Home Needs to continue in-hospital care: Pending SNF    Diet:  Diet Order  DIET SOFT Room service appropriate? Yes; Fluid consistency: Thin  Diet effective now                   Scheduled Meds:  acetaminophen   1,000 mg Oral Q6H   atorvastatin   80 mg Oral q AM   Chlorhexidine  Gluconate Cloth  6 each Topical Daily   Chlorhexidine  Gluconate Cloth  6 each Topical Q0600   darbepoetin (ARANESP ) injection - DIALYSIS  60 mcg Subcutaneous Q Sat-1800    febuxostat   40 mg Oral q AM   feeding supplement (NEPRO CARB STEADY)  237 mL Oral BID BM   heparin  injection (subcutaneous)  5,000 Units Subcutaneous Q8H   loperamide   2 mg Oral Daily   metoprolol  tartrate  12.5 mg Oral BID   pantoprazole   40 mg Oral Daily   rOPINIRole   0.5 mg Oral BID   sevelamer  carbonate  1,600 mg Oral TID WC    PRN meds: albuterol , alteplase , anticoagulant sodium citrate , artificial tears, feeding supplement (NEPRO CARB STEADY), heparin , HYDROmorphone  (DILAUDID ) injection, lidocaine  (PF), lidocaine -prilocaine , methocarbamol , ondansetron  (ZOFRAN ) IV, pentafluoroprop-tetrafluoroeth, traMADol , zolpidem    Infusions:   anticoagulant sodium citrate       Antimicrobials: Anti-infectives (From admission, onward)    Start     Dose/Rate Route Frequency Ordered Stop   12/13/23 2200  piperacillin -tazobactam (ZOSYN ) IVPB 2.25 g        2.25 g 100 mL/hr over 30 Minutes Intravenous Every 8 hours 12/13/23 2113 12/19/23 0847   12/13/23 2111  piperacillin -tazobactam (ZOSYN ) 3.375 GM/50ML IVPB       Note to Pharmacy: Golob, Jamie C: cabinet override      12/13/23 2111 12/14/23 0914   12/13/23 1415  piperacillin -tazobactam (ZOSYN ) IVPB 3.375 g        3.375 g 100 mL/hr over 30 Minutes Intravenous  Once 12/13/23 1413 12/13/23 1653       Objective: Vitals:   12/25/23 1000 12/25/23 1030  BP: (!) 109/54 116/60  Pulse: 96 99  Resp: (!) 24 (!) 21  Temp:    SpO2: 98% 96%    Intake/Output Summary (Last 24 hours) at 12/25/2023 1035 Last data filed at 12/25/2023 0531 Gross per 24 hour  Intake 480 ml  Output 250 ml  Net 230 ml   Filed Weights   12/22/23 1600 12/25/23 0752 12/25/23 0755  Weight: 72.6 kg (P) 76 kg 76 kg   Weight change:  Body mass index is 30.65 kg/m.   Physical Exam: General exam: Pleasant, elderly Caucasian female. Skin: No rashes, lesions or ulcers. HEENT: Atraumatic, normocephalic, no obvious bleeding Lungs: Clear to auscultation bilaterally,  CVS:  S1, S2, no murmur,   GI/Abd: Soft, nontender, nondistended, bowel sound present, ostomy with greenish output CNS: Alert, awake, oriented x 3 Psychiatry: Mood appropriate Extremities: No pedal edema, no calf tenderness,   Data Review: I have personally reviewed the laboratory data and studies available.  F/u labs ordered Unresulted Labs (From admission, onward)    None       Signed, Chapman Rota, MD [facility] 12/25/2023

## 2023-12-25 NOTE — Plan of Care (Signed)
  Problem: Health Behavior/Discharge Planning: Goal: Ability to manage health-related needs will improve Outcome: Progressing   Problem: Activity: Goal: Risk for activity intolerance will decrease Outcome: Progressing   Problem: Nutrition: Goal: Adequate nutrition will be maintained Outcome: Progressing   Problem: Pain Managment: Goal: General experience of comfort will improve and/or be controlled Outcome: Progressing   Problem: Safety: Goal: Ability to remain free from injury will improve Outcome: Progressing

## 2023-12-26 DIAGNOSIS — K631 Perforation of intestine (nontraumatic): Secondary | ICD-10-CM | POA: Diagnosis not present

## 2023-12-26 MED ORDER — CHLORHEXIDINE GLUCONATE CLOTH 2 % EX PADS
6.0000 | MEDICATED_PAD | Freq: Every day | CUTANEOUS | Status: DC
  Administered 2023-12-27 – 2023-12-28 (×2): 6 via TOPICAL

## 2023-12-26 NOTE — Progress Notes (Signed)
 Physical Therapy Treatment Patient Details Name: Rachel Villarreal MRN: [id number] DOB: 1945-10-20 Today's Date: 12/26/2023   History of Present Illness 78 y.o. female admitted on 12/13/23 s/p ex lap with segmental TC resection with end colostomy, abdominal wall debridement, and ventral incisional hernia repair with vicryl mesh, Dr. Dasie 12/13/2023 for transverse colon perforation within an incarcerated ventral hernia  PMH: HTN, ESRD on HD, breast cancer, CAD, HFimpEF, PAF on eliquis .    PT Comments  Pt demo mod I bed mobility. Supervision transfers and amb 200' with RW. Assist needed to manage wound vac. Pt with c/o mild nausea upon return to room. PT to continue per current POC.     If plan is discharge home, recommend the following: Assist for transportation;A little help with walking and/or transfers;A little help with bathing/dressing/bathroom;Help with stairs or ramp for entrance;Assistance with cooking/housework   Can travel by private vehicle     Yes  Equipment Recommendations  None recommended by PT    Recommendations for Other Services       Precautions / Restrictions Precautions Precautions: Fall;Other (comment) Precaution/Restrictions Comments: abdominal precautions/wound vac, ostomy Required Braces or Orthoses: Other Brace Other Brace: abdominal binder     Mobility  Bed Mobility Overal bed mobility: Modified Independent                  Transfers Overall transfer level: Needs assistance Equipment used: Rolling walker (2 wheels) Transfers: Sit to/from Stand Sit to Stand: Supervision           General transfer comment: supervision for safety and wound vac line    Ambulation/Gait Ambulation/Gait assistance: Supervision Gait Distance (Feet): 200 Feet Assistive device: Rolling walker (2 wheels) Gait Pattern/deviations: Step-through pattern, Decreased stride length Gait velocity: decreased Gait velocity interpretation: 1.31 - 2.62 ft/sec, indicative of  limited community ambulator   General Gait Details: grossly supervision for safety, no overt LOB,   Stairs             Wheelchair Mobility     Tilt Bed    Modified Rankin (Stroke Patients Only)       Balance Overall balance assessment: Mild deficits observed, not formally tested                                          Communication Communication Communication: No apparent difficulties  Cognition Arousal: Alert Behavior During Therapy: WFL for tasks assessed/performed   PT - Cognitive impairments: No apparent impairments                         Following commands: Intact      Cueing Cueing Techniques: Verbal cues  Exercises      General Comments        Pertinent Vitals/Pain Pain Assessment Pain Assessment: No/denies pain    Home Living                          Prior Function            PT Goals (current goals can now be found in the care plan section) Acute Rehab PT Goals Patient Stated Goal: home Progress towards PT goals: Progressing toward goals    Frequency    Min 2X/week      PT Plan      Co-evaluation  AM-PAC PT 6 Clicks Mobility   Outcome Measure  Help needed turning from your back to your side while in a flat bed without using bedrails?: None Help needed moving from lying on your back to sitting on the side of a flat bed without using bedrails?: None Help needed moving to and from a bed to a chair (including a wheelchair)?: A Little Help needed standing up from a chair using your arms (e.g., wheelchair or bedside chair)?: A Little Help needed to walk in hospital room?: A Little Help needed climbing 3-5 steps with a railing? : A Lot 6 Click Score: 19    End of Session Equipment Utilized During Treatment: Gait belt;Other (comment) (abdominal binder) Activity Tolerance: Patient tolerated treatment well Patient left: in bed;with call bell/phone within reach Nurse  Communication: Mobility status PT Visit Diagnosis: Other abnormalities of gait and mobility (R26.89);Unsteadiness on feet (R26.81)     Time: 9147-9092 PT Time Calculation (min) (ACUTE ONLY): 15 min  Charges:    $Gait Training: 8-22 mins PT General Charges $$ ACUTE PT VISIT: 1 Visit                     Sari MATSU., PT  Office # [phone]    Erven Sari Shaker 12/26/2023, 9:49 AM

## 2023-12-26 NOTE — TOC Progression Note (Addendum)
 Transition of Care (TOC) - Progression Note   Insurance denied SNF , peer to peer denied . NCM and SW went to patient's bedside and notified patient.   Home VAC at bedside. Home health PT/RN arranged with [facility].   SW submitted application for transportation to HD, however takes 21 days to be reviewed.   Patient has no family or friends who could assist her in getting to and from HD. She feels she needs assistance getting in and out of car and down her driveway therefore she could not use uber . Patient asking to work with PT in hospital on stairs and car mobility. NCM sent secure chat to MD and OT, no PT signed in today    MD asked about LTAC referral , per Ciera with [facility] Unfortunately she  does not have the revenue codes required for [facility] per the [facility] guidelines   Secure chatted [facility]CS to see when patient will be cleared to drive  Patient Details  Name: Rachel Villarreal MRN: [id number] Date of Birth: 02/26/1946  Transition of Care [facility]) CM/SW Contact  Stephane Powell Jansky, RN Phone Number: 12/26/2023, 2:32 PM  Clinical Narrative:       Expected Discharge Plan: Skilled Nursing Facility Barriers to Discharge: Continued Medical Work up, SNF Pending bed offer, English as a second language teacher               Expected Discharge Plan and Services   Discharge Planning Services: CM Consult Post Acute Care Choice: Home Health Living arrangements for the past 2 months: Single Family Home                 DME Arranged: N/A         HH Arranged:  (see note)           Social Drivers of Health (SDOH) Interventions SDOH Screenings   Food Insecurity: No Food Insecurity (12/13/2023)  Housing: Low Risk  (12/13/2023)  Transportation Needs: No Transportation Needs (12/13/2023)  Utilities: Not At Risk (12/13/2023)  Depression (PHQ2-9): Low Risk  (02/28/2022)  Financial Resource Strain: Low Risk  (09/02/2021)  Social Connections: Moderately Integrated (12/13/2023)  Stress: No Stress Concern  Present (09/02/2021)  Tobacco Use: Medium Risk (12/13/2023)    Readmission Risk Interventions     No data to display

## 2023-12-26 NOTE — Progress Notes (Signed)
 Occupational Therapy Treatment Patient Details Name: Rachel Villarreal MRN: [id number] DOB: [birth year] Today's Date: 12/26/2023   History of present illness 78 y.o. female admitted on 12/13/23 s/p ex lap with segmental TC resection with end colostomy, abdominal wall debridement, and ventral incisional hernia repair with vicryl mesh, Dr. Dasie 12/13/2023 for transverse colon perforation within an incarcerated ventral hernia  PMH: HTN, ESRD on HD, breast cancer, CAD, HFimpEF, PAF on eliquis .   OT comments  Problem solved how pt may manage IADLs in her home. Instructed in car transfer minimizing stress on abdomen. Pt would like to wear her home wound vac to make sure she can manage and asks whether she has been cleared to drive by MD. Secure chat sent to teamp.       If plan is discharge home, recommend the following:  Assistance with cooking/housework;Help with stairs or ramp for entrance;A little help with bathing/dressing/bathroom   Equipment Recommendations  None recommended by OT    Recommendations for Other Services      Precautions / Restrictions Precautions Precautions: Fall;Other (comment) Recall of Precautions/Restrictions: Intact Precaution/Restrictions Comments: abdominal precautions/wound vac, ostomy Required Braces or Orthoses: Other Brace Other Brace: abdominal binder Restrictions Weight Bearing Restrictions Per Provider Order: No       Mobility Bed Mobility Overal bed mobility: Modified Independent                  Transfers Overall transfer level: Needs assistance Equipment used: None Transfers: Sit to/from Stand Sit to Stand: Supervision           General transfer comment: supervision for safety and wound vac line     Balance                                           ADL either performed or assessed with clinical judgement   ADL       Grooming: Standing;Supervision/safety                   Toilet Transfer:  Supervision/safety;Ambulation;Comfort height toilet;Rolling walker (2 wheels)           Functional mobility during ADLs: Supervision/safety General ADL Comments: Pt needing supervision for OOB due to wound vac. Secure chat sent to team to ask that pt be placed on her home vac so she see if she can mobilize independently. Pt also with question of whether she has been cleared to drive by surgeon. Long conversation with pt about having her cleaning crew manage her bed sheets and heavy laundry loads while they are cleaning. Educated in how she may manage her cat's litter box and feed her cat on an elevated surface. Pt can order and pick up groceries if allowed to drive and take many trips to put them in her house to adhere to lifting restrictions.    Extremity/Trunk Assessment              Vision       Restaurant manager, fast food Communication: No apparent difficulties   Cognition Arousal: Alert Behavior During Therapy: WFL for tasks assessed/performed Cognition: No apparent impairments                               Following commands: Intact        Cueing  Cueing Techniques: Verbal cues  Exercises      Shoulder Instructions       General Comments      Pertinent Vitals/ Pain       Pain Assessment Pain Assessment: No/denies pain  Home Living                                          Prior Functioning/Environment              Frequency  Min 2X/week        Progress Toward Goals  OT Goals(current goals can now be found in the care plan section)  Progress towards OT goals: Progressing toward goals  Acute Rehab OT Goals OT Goal Formulation: With patient Time For Goal Achievement: 01/04/24 Potential to Achieve Goals: Good  Plan      Co-evaluation                 AM-PAC OT 6 Clicks Daily Activity     Outcome Measure   Help from another person eating meals?: None Help from another  person taking care of personal grooming?: A Little Help from another person toileting, which includes using toliet, bedpan, or urinal?: A Little Help from another person bathing (including washing, rinsing, drying)?: A Little Help from another person to put on and taking off regular upper body clothing?: None Help from another person to put on and taking off regular lower body clothing?: A Little 6 Click Score: 20    End of Session    OT Visit Diagnosis: Muscle weakness (generalized) (M62.81)   Activity Tolerance Patient tolerated treatment well   Patient Left in bed;with call bell/phone within reach   Nurse Communication          Time: 8549-8477 OT Time Calculation (min): 32 min  Charges: OT General Charges $OT Visit: 1 Visit OT Treatments $Self Care/Home Management : 23-37 mins  Mliss HERO, OTR/L Acute Rehabilitation Services Office: [phone]   Kennth Mliss Helling 12/26/2023, 3:44 PM

## 2023-12-26 NOTE — Progress Notes (Signed)
 RD consulted for nutrition education regarding new colostomy education.   RD provided  colostomy nutrition therapy education  from the [employer]. Reviewed home diet with pt and suggested ways to meet nutrition goals over the next several weeks. Explained reasons to follow Low Fiber diet for the next 4-6 weeks and discussed ways to achieve. Discussed best practice for long-term management of colostomy output. Including ways to limit gas/odor, diarrhea, and ways to help thicken stool via diet. Discussed ways to increase fiber content in an appropriate time frame. Encouraged soluble fiber and discouraged consumption of greasy fatty foods and foods high in sugar. Encouraged fluid intake. Discussed symptom management should high colostomy output occur.   Pt verbalizes understanding of information provided. Expect good compliance.  Body mass index is 30.65   Current diet order is soft diet, patient is consuming approximately 75-100% of meals at this time. Labs and medications reviewed. No further nutrition interventions warranted at this time. RD contact information provided. If additional nutrition issues arise, please re-consult Rd.  Olivia Kenning, RD Registered Dietitian  See Amion for more information

## 2023-12-26 NOTE — Progress Notes (Signed)
  [facility] Progress Note   Subjective:    Seen in room No c/o's    Objective Vitals:   12/25/23 2028 12/26/23 0602 12/26/23 0737 12/26/23 0802  BP: (!) 135/56 (!) 138/53 133/61 133/61  Pulse: 99 90 96 96  Resp: 18  16   Temp: 98.3 F (36.8 C) 98.1 F (36.7 C) 98.2 F (36.8 C)   TempSrc: Oral Oral Oral   SpO2: 98% 97% 97%   Weight:      Height:       Physical Exam General: Awake, alert, on RA, NAD ENT: dry MM Heart: S1 and S2; No wheezing, gallops, or rubs Abd: ostomy in place, nontender abd Lungs: Clear anteriorly Extremities: no edema bilat  Dialysis Access: R AVF +t/b   OP HD: [facility] TTS 3h   B350   68.7kg   2K bath  AVF   Heparin  none Last hd 9/02 post wt 69.9kg  Mircera 30 mcg q 4, last 8/26   Home bp meds: Metoprolol  xl 50 hs Lasix  80 mg non hd days  Assessment/Plan:  Perforated bowel due to incarcerated hernia: S/p partial colectomy w/ colostomy creation and incisional hernia repair on 9/4. Pip/tazo completed 9/9. Per pmd/ surgery. Wound vac in place ESRD: on HD TTS.  Next HD tomorrow.  BP: bp's acceptable, follow.  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 Follow, transfuse prn.  Will give dose aranesp  as she's been in the 8s consistently 2nd HPTH: Corr Ca up and phos ok. No VDRA. Continue binders.  Rachel Fret  MD  [facility] 12/26/2023, 1:50 PM  Recent Labs  Lab 12/20/23 0421 12/22/23 0310 12/23/23 0757 12/24/23 0245  HGB 8.5* 8.0* 8.7*  --   ALBUMIN  2.0*  --   --   --   CALCIUM  8.9 9.0 9.4 9.3  PHOS 5.3*  --   --   --   CREATININE 6.80* 5.74* 4.44* 5.75*  K 3.8 3.6 3.8 4.5    Inpatient medications:  acetaminophen   1,000 mg Oral Q6H   atorvastatin   80 mg Oral q AM   Chlorhexidine  Gluconate Cloth  6 each Topical Daily   Chlorhexidine  Gluconate Cloth  6 each Topical Q0600   darbepoetin (ARANESP ) injection - DIALYSIS  60 mcg Subcutaneous Q Sat-1800    febuxostat   40 mg Oral q AM   feeding supplement (NEPRO CARB STEADY)  237 mL Oral BID BM   heparin  injection (subcutaneous)  5,000 Units Subcutaneous Q8H   loperamide   2 mg Oral Daily   metoprolol  tartrate  12.5 mg Oral BID   pantoprazole   40 mg Oral Daily   rOPINIRole   0.5 mg Oral BID   sevelamer  carbonate  1,600 mg Oral TID WC    albuterol , artificial tears, HYDROmorphone  (DILAUDID ) injection, methocarbamol , ondansetron  (ZOFRAN ) IV, traMADol , zolpidem

## 2023-12-26 NOTE — Progress Notes (Signed)
 PROGRESS NOTE  TELETHA PETREA  DOB: 1945-09-28  PCP: Ransom Other, MD FMW:[id number]  DOA: 12/13/2023  LOS: 13 days  Hospital Day: 14  Brief narrative: Rachel Villarreal is a 78 y.o. 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  About a week prior, she noticed some redness over the hernia, went to her PCP and was started on doxycycline .  The day prior to presentation, she noticed that the area had blackened and in the next 24 hours, started to drain pus hence he came to the ED on 9/4. CT abdomen pelvis showed left paracentral umbilical hernia containing a knuckle of transverse colon or a Richter hernia with surrounding abnormal extraluminal gas in the hernia sac indicating perforated viscus.  Seen by general surgery, diagnosed to have incarcerated ventral incisional hernia with colonic perforation. Underwent emergency exploratory laparotomy with segmental transverse colon resection, creation of end transverse colostomy, abdominal wall debridement, ventral incisional hernia repair. Postop, admitted to [facility]  Subjective: Patient was seen and examined this morning. Pleasant elderly Caucasian female.  Not in distress. Has a total measured colostomy output of 625 mL in 24 hours Per dialysis nurse, her blood pressure was soft-no fluid being removed, only getting ultrafiltration.  Assessment and plan: Ventral incisional hernia with skin necrosis secondary to bowel perforation S/p ex lap, colon resection, colostomy, right mid and ventral hernia repair - Dr. Leonor 9/4 Completed course of IV Zosyn  Postop ileus improved Bowel function returned.  Colostomy bag with greenish output. Has a total measured colostomy output of 625 mL in 24 hours.   9/16, I added Imodium  2 mg daily for 3 days.  Continue to monitor.  Dietitian consulted as  well. Currently has a wound VAC in place now Continue management per surgery, wound VAC tchanged on 12/21/2023.[name]  She also had ostomy teaching   ESRD-HD-TTS  Nephrology following Diuretics on hold   H/o SAH -  Had a fall with SAH secondary to Eliquis . Hospitalized 8/28 -8/30 under neurosurgery Currently anticoagulation on hold.  Sutures removed from patient's head on 12/23/2023.   Paroxysmal A-fib  Currently on Lopressor  12.5 mg mg twice daily.  Heart rate and blood pressure good  Eliquis  on hold after her recent [facility].   H/o CAD, HLD No anginal symptoms  Not on any blood thinner  Continue Lipitor    Restless leg syndrome  Chronic insomnia Continue Requip  as before.  Also on Ambien  nightly   GERD - Continue Protonix .   H/o breast cancer  On anastrozole .  H/o gout Continue Uloric    Mobility:  PT Orders: Active   PT Follow up Rec: Skilled Nursing-Short Term Rehab (<3 Hours/Day)12/26/2023 [phone]   Goals of care   Code Status: Full Code     DVT prophylaxis:  heparin  injection 5,000 Units Start: 12/14/23 1400 SCDs Start: 12/14/23 0336   Antimicrobials: None Fluid: None Consultants: Nephrology Family Communication: None at bedside  Status: Inpatient Level of care:  Telemetry Surgical   Patient is from: Home Needs to continue in-hospital care: Pending SNF.  Peer to peer failed.    Diet:  Diet Order             DIET SOFT Room service appropriate? Yes; Fluid consistency: Thin  Diet effective now  Scheduled Meds:  acetaminophen   1,000 mg Oral Q6H   atorvastatin   80 mg Oral q AM   Chlorhexidine  Gluconate Cloth  6 each Topical Daily   Chlorhexidine  Gluconate Cloth  6 each Topical Q0600   darbepoetin (ARANESP ) injection - DIALYSIS  60 mcg Subcutaneous Q Sat-1800   febuxostat   40 mg Oral q AM   feeding supplement (NEPRO CARB STEADY)  237 mL Oral BID BM   heparin  injection (subcutaneous)  5,000 Units Subcutaneous Q8H   loperamide   2 mg  Oral Daily   metoprolol  tartrate  12.5 mg Oral BID   pantoprazole   40 mg Oral Daily   rOPINIRole   0.5 mg Oral BID   sevelamer  carbonate  1,600 mg Oral TID WC    PRN meds: albuterol , artificial tears, HYDROmorphone  (DILAUDID ) injection, methocarbamol , ondansetron  (ZOFRAN ) IV, traMADol , zolpidem    Infusions:     Antimicrobials: Anti-infectives (From admission, onward)    Start     Dose/Rate Route Frequency Ordered Stop   12/13/23 2200  piperacillin -tazobactam (ZOSYN ) IVPB 2.25 g        2.25 g 100 mL/hr over 30 Minutes Intravenous Every 8 hours 12/13/23 2113 12/19/23 0847   12/13/23 2111  piperacillin -tazobactam (ZOSYN ) 3.375 GM/50ML IVPB       Note to Pharmacy: Golob, Jamie C: cabinet override      12/13/23 2111 12/14/23 0914   12/13/23 1415  piperacillin -tazobactam (ZOSYN ) IVPB 3.375 g        3.375 g 100 mL/hr over 30 Minutes Intravenous  Once 12/13/23 1413 12/13/23 1653       Objective: Vitals:   12/26/23 0737 12/26/23 0802  BP: 133/61 133/61  Pulse: 96 96  Resp: 16   Temp: 98.2 F (36.8 C)   SpO2: 97%     Intake/Output Summary (Last 24 hours) at 12/26/2023 1341 Last data filed at 12/26/2023 0600 Gross per 24 hour  Intake --  Output 325 ml  Net -325 ml   Filed Weights   12/22/23 1600 12/25/23 0752 12/25/23 0755  Weight: 72.6 kg (P) 76 kg 76 kg   Weight change:  Body mass index is 30.65 kg/m.   Physical Exam: General exam: Pleasant, elderly Caucasian female.  Not in distress Skin: No rashes, lesions or ulcers. HEENT: Atraumatic, normocephalic, no obvious bleeding Lungs: Clear to auscultation bilaterally,  CVS: S1, S2, no murmur,   GI/Abd: Soft, nontender, nondistended, bowel sound present, ostomy with greenish output CNS: Alert, awake, oriented x 3 Psychiatry: Mood appropriate Extremities: No pedal edema, no calf tenderness,   Data Review: I have personally reviewed the laboratory data and studies available.  F/u labs ordered Unresulted Labs (From  admission, onward)     Start     Ordered   12/27/23 0500  Basic metabolic panel with GFR  Tomorrow morning,   R       Question:  Specimen collection method  Answer:  Lab=Lab collect   12/26/23 0851   12/27/23 0500  CBC with Differential/Platelet  Tomorrow morning,   R       Question:  Specimen collection method  Answer:  Lab=Lab collect   12/26/23 0851            Signed, Chapman Rota, MD [facility] 12/26/2023

## 2023-12-26 NOTE — Plan of Care (Signed)

## 2023-12-27 DIAGNOSIS — K631 Perforation of intestine (nontraumatic): Secondary | ICD-10-CM | POA: Diagnosis not present

## 2023-12-27 LAB — CBC WITH DIFFERENTIAL/PLATELET
Abs Immature Granulocytes: 0.21 K/uL — ABNORMAL HIGH (ref 0.00–0.07)
Basophils Absolute: 0 K/uL (ref 0.0–0.1)
Basophils Relative: 0 %
Eosinophils Absolute: 0.4 K/uL (ref 0.0–0.5)
Eosinophils Relative: 4 %
HCT: 26.8 % — ABNORMAL LOW (ref 36.0–46.0)
Hemoglobin: 8.5 g/dL — ABNORMAL LOW (ref 12.0–15.0)
Immature Granulocytes: 3 %
Lymphocytes Relative: 18 %
Lymphs Abs: 1.5 K/uL (ref 0.7–4.0)
MCH: 31 pg (ref 26.0–34.0)
MCHC: 31.7 g/dL (ref 30.0–36.0)
MCV: 97.8 fL (ref 80.0–100.0)
Monocytes Absolute: 0.7 K/uL (ref 0.1–1.0)
Monocytes Relative: 9 %
Neutro Abs: 5.3 K/uL (ref 1.7–7.7)
Neutrophils Relative %: 66 %
Platelets: 319 K/uL (ref 150–400)
RBC: 2.74 MIL/uL — ABNORMAL LOW (ref 3.87–5.11)
RDW: 16.1 % — ABNORMAL HIGH (ref 11.5–15.5)
WBC: 8.1 K/uL (ref 4.0–10.5)
nRBC: 0 % (ref 0.0–0.2)

## 2023-12-27 LAB — BASIC METABOLIC PANEL WITH GFR
Anion gap: 17 — ABNORMAL HIGH (ref 5–15)
BUN: 54 mg/dL — ABNORMAL HIGH (ref 8–23)
CO2: 24 mmol/L (ref 22–32)
Calcium: 9.8 mg/dL (ref 8.9–10.3)
Chloride: 99 mmol/L (ref 98–111)
Creatinine, Ser: 6.56 mg/dL — ABNORMAL HIGH (ref 0.44–1.00)
GFR, Estimated: 6 mL/min — ABNORMAL LOW (ref >60)
Glucose, Bld: 93 mg/dL (ref 70–99)
Potassium: 4.6 mmol/L (ref 3.5–5.1)
Sodium: 140 mmol/L (ref 135–145)

## 2023-12-27 MED ORDER — NEPRO/CARBSTEADY PO LIQD: 237.0000 mL | ORAL | Status: DC | PRN

## 2023-12-27 MED ORDER — PENTAFLUOROPROP-TETRAFLUOROETH EX AERO: 1.0000 | INHALATION_SPRAY | CUTANEOUS | Status: DC | PRN

## 2023-12-27 MED ORDER — ALTEPLASE 2 MG IJ SOLR: 2.0000 mg | Freq: Once | INTRAMUSCULAR | Status: DC | PRN

## 2023-12-27 MED ORDER — ANTICOAGULANT SODIUM CITRATE 4% (200MG/5ML) IV SOLN: 5.0000 mL | Status: DC | PRN

## 2023-12-27 MED ORDER — HEPARIN SODIUM (PORCINE) 1000 UNIT/ML DIALYSIS: 1000.0000 [IU] | INTRAMUSCULAR | Status: DC | PRN

## 2023-12-27 MED ORDER — LIDOCAINE HCL (PF) 1 % IJ SOLN: 5.0000 mL | INTRAMUSCULAR | Status: DC | PRN

## 2023-12-27 MED ORDER — LIDOCAINE-PRILOCAINE 2.5-2.5 % EX CREA: 1.0000 | TOPICAL_CREAM | CUTANEOUS | Status: DC | PRN

## 2023-12-27 NOTE — Therapy (Incomplete)
 OUTPATIENT PHYSICAL THERAPY NEURO EVALUATION   Patient Name: Rachel Villarreal MRN: [id number] DOB:[birth year], 78 y.o., female Today's Date: 12/27/2023   PCP: Ransom Other, MD  REFERRING PROVIDER: Dino Antu, MD  END OF SESSION:   Past Medical History:  Diagnosis Date   Abnormal Pap smear of vagina    Anemia    Asthma    Mild   Cancer (HCC)    left breast ILC   Chronic kidney disease (CKD)    stage 4   GERD (gastroesophageal reflux disease)    Gout 05/2012   Hernia, incisional    History of abnormal Pap smear    CIN III, 2001   History of blood transfusion    POST OP   History of uterine fibroid    Hypertension    Peritonitis (HCC)    Respiratory failure (HCC)    after ruptured appendix   Ruptured appendix    Seasonal allergies    Shingles 02/2013   very mild case   Urine incontinence    Past Surgical History:  Procedure Laterality Date   A/V FISTULAGRAM Right 03/19/2023   Procedure: A/V Fistulagram;  Surgeon: Sheree Penne Bruckner, MD;  Location: [facility] INVASIVE CV LAB;  Service: Cardiovascular;  Laterality: Right;   APPENDECTOMY     AV FISTULA PLACEMENT Right 04/24/2022   Procedure: RIGHT BRACHIOCEPHALIC ARTERIOVENOUS (AV) FISTULA CREATION;  Surgeon: Eliza Bruckner RAMAN, MD;  Location[facility];  Service: Vascular;  Laterality: Right;   BREAST BIOPSY Right    fibrocystic changes   BREAST BIOPSY Left 08/02/2021   BREAST LUMPECTOMY WITH RADIOACTIVE SEED LOCALIZATION Left 09/12/2021   Procedure: LEFT BREAST LUMPECTOMY WITH RADIOACTIVE SEED LOCALIZATION;  Surgeon: Ebbie Cough, MD;  Location: [facility];  Service: General;  Laterality: Left;   CATARACT EXTRACTION Bilateral    COLONOSCOPY     COLONOSCOPY WITH PROPOFOL  N/A 02/18/2018   Procedure: COLONOSCOPY WITH PROPOFOL ;  Surgeon: Rosalie Kitchens, MD;  Location: [facility];  Service: Endoscopy;  Laterality: N/A;  Use ultraslim scope    COLOSTOMY  12/13/2023   Procedure: CREATION, COLOSTOMY;   Surgeon: Dasie Leonor CROME, MD;  Location: [facility] OR;  Service: General;;   FISTULA SUPERFICIALIZATION Right 04/13/2023   Procedure: FISTULA SUPERFICIALIZATION;  Surgeon: Sheree Penne Bruckner, MD;  Location: [facility] OR;  Service: Vascular;  Laterality: Right;   IR FLUORO GUIDE CV LINE RIGHT  03/10/2023   LAPAROTOMY N/A 12/13/2023   Procedure: LAPAROTOMY, EXPLORATORY; COLON RESECTION;  Surgeon: Dasie Leonor CROME, MD;  Location: [facility] OR;  Service: General;  Laterality: N/A;   LEEP  2000   CIN 2/3   RE-EXCISION OF BREAST LUMPECTOMY Left 09/29/2021   Procedure: LEFT  BREAST RE-EXCISION LUMPECTOMY;  Surgeon: Ebbie Cough, MD;  Location: [facility] ORS;  Service: General;  Laterality: Left;   REFRACTIVE SURGERY     RIGHT/LEFT HEART CATH AND CORONARY ANGIOGRAPHY N/A 04/23/2023   Procedure: RIGHT/LEFT HEART CATH AND CORONARY ANGIOGRAPHY;  Surgeon: Swaziland, Peter M, MD;  Location: [facility] INVASIVE CV LAB;  Service: Cardiovascular;  Laterality: N/A;   SUPRACERVICAL ABDOMINAL HYSTERECTOMY  2001   Patient Active Problem List   Diagnosis Date Noted   Perforated bowel (HCC) 12/14/2023   Atrial flutter (HCC) 12/14/2023   SAH (subarachnoid hemorrhage) (HCC) 12/06/2023   Acute ischemic stroke (HCC) 12/06/2023   Scalp laceration, initial encounter 12/06/2023   Anemia of chronic renal failure 12/06/2023   Coronary artery disease 12/06/2023   Malnutrition of moderate degree 04/24/2023   Influenza A with pneumonia 04/17/2023  Paroxysmal atrial fibrillation (HCC) 04/17/2023   Non-ST elevated myocardial infarction (HCC) 04/16/2023   ESRD on dialysis (HCC) 03/09/2023   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   Insomnia 03/09/2023   Anemia in chronic kidney disease 12/12/2022   Coagulation defect, unspecified (HCC) 12/12/2022   History of hysterectomy, supracervical 03/01/2022   History of loop electrical excision procedure (LEEP) 03/01/2022   Breast cancer (HCC) 09/12/2021   Malignant neoplasm of upper-outer quadrant of left breast in  female, estrogen receptor positive (HCC) 08/29/2021   Allergic rhinitis 01/20/2021   GERD (gastroesophageal reflux disease) 01/20/2021   Osteopenia 01/20/2021   Pure hypercholesterolemia 01/20/2021   Vitamin D deficiency 01/20/2021   Gout 03/28/2016   Essential hypertension 03/28/2016   Hyperlipidemia 03/28/2016   Hormone replacement therapy (HRT) 11/18/2013    ONSET DATE: ***  REFERRING DIAG: I60.9 (ICD-10-CM) - SAH (subarachnoid hemorrhage) (HCC)  THERAPY DIAG:  No diagnosis found.  Rationale for Evaluation and Treatment: Rehabilitation  SUBJECTIVE:                                                                                                                                                                                             SUBJECTIVE STATEMENT: *** Pt accompanied by: {accompnied:27141}  PERTINENT HISTORY: Asthma, L breast CA s/o lumpectomy, CKD, gout, HTN, R AV fistula  PAIN:  Are you having pain? {OPRCPAIN:27236}  PRECAUTIONS: {Therapy precautions:24002}  RED FLAGS: {PT Red Flags:29287}   WEIGHT BEARING RESTRICTIONS: {Yes ***/No:24003}  FALLS: Has patient fallen in last 6 months? {fallsyesno:27318}  LIVING ENVIRONMENT: Lives with: {OPRC lives with:25569::lives with their family} Lives in: {Lives in:25570} Stairs: {opstairs:27293} Has following equipment at home: {Assistive devices:23999}  PLOF: {PLOF:24004}  PATIENT GOALS: ***  OBJECTIVE:  Note: Objective measures were completed at Evaluation unless otherwise noted.  DIAGNOSTIC FINDINGS: 12/06/23 brain MRI: Single punctate focus of diffusion signal abnormality involving the right occipital cortex, suspicious for a tiny acute ischemic nonhemorrhagic infarct. 2. No other acute intracranial abnormality. 3. Underlying age-related cerebral atrophy with moderate chronic microvascular ischemic disease, with a few scattered remote lacunar infarcts as above. 4. Small soft tissue contusion at the left  occipital scalp.  COGNITION: Overall cognitive status: {cognition:24006}   SENSATION: {sensation:27233}  COORDINATION: Alternating pronation/supination: *** Alternating toe tap: *** Finger to nose: *** Heel to shin: ***   MUSCLE TONE: {LE tone:25568}  POSTURE: {posture:25561}  LOWER EXTREMITY ROM:     Active  Right Eval Left Eval  Hip flexion    Hip extension    Hip abduction    Hip adduction    Hip internal rotation    Hip external rotation  Knee flexion    Knee extension    Ankle dorsiflexion    Ankle plantarflexion    Ankle inversion    Ankle eversion     (Blank rows = not tested)  LOWER EXTREMITY MMT:    MMT Right Eval Left Eval  Hip flexion    Hip extension    Hip abduction    Hip adduction    Hip internal rotation    Hip external rotation    Knee flexion    Knee extension    Ankle dorsiflexion    Ankle plantarflexion    Ankle inversion    Ankle eversion    (Blank rows = not tested)  GAIT: Findings: Assistive device utilized:{Assistive devices:23999}, Level of assistance: {Levels of assistance:24026}, and Comments: ***  FUNCTIONAL TESTS:  {Functional tests:24029}  PATIENT SURVEYS:  {rehab surveys:24030}                                                                                                                              TREATMENT DATE: ***    PATIENT EDUCATION: Education details: *** Person educated: {Person educated:25204} Education method: {Education Method:25205} Education comprehension: {Education Comprehension:25206}  HOME EXERCISE PROGRAM: ***  GOALS: Goals reviewed with patient? Yes  SHORT TERM GOALS: Target date: {follow up:25551}  Patient to be independent with initial HEP. Baseline: HEP initiated Goal status: {GOALSTATUS:25110}    LONG TERM GOALS: Target date: {follow up:25551}  Patient to be independent with advanced HEP. Baseline: Not yet initiated  Goal status: {GOALSTATUS:25110}  Patient to  demonstrate B LE strength >/=4+/5.  Baseline: See above Goal status: {GOALSTATUS:25110}  Patient to demonstrate *** ROM WFL and without pain limiting.  Baseline: *** Goal status: {GOALSTATUS:25110}  Patient to report and demonstrate improved head, neck, and shoulder posture at rest and with activity.  Baseline: *** Goal status: {GOALSTATUS:25110}  Patient to demonstrate alternating reciprocal pattern when ascending and descending stairs with good stability and 1 handrail as needed.   Baseline: Unable Goal status: {GOALSTATUS:25110}  Patient to score at least 20/24 on DGI in order to decrease risk of falls.  Baseline: *** Goal status: {GOALSTATUS:25110}  Patient to complete TUG in <14 sec with LRAD in order to decrease risk of falls.   Baseline: *** Goal status: {GOALSTATUS:25110}  Patient to demonstrate 5xSTS test in <15 sec in order to decrease risk of falls.  Baseline: *** Goal status: {GOALSTATUS:25110}  Patient to score at least ***/56 on Berg in order to decrease risk of falls.  Baseline: *** Goal status: {GOALSTATUS:25110}  Patient to score at least *** on FOTO in order to indicate improved functional outcomes.  Baseline: *** Goal status: {GOALSTATUS:25110}   ASSESSMENT:  CLINICAL IMPRESSION:  Patient is a 78 y/o F presenting to OPPT with c/o *** s/p hospitalization 12/06/23-12/08/23 after traumatic SAH and with subsequent hospitalization 12/13/23 for transverse colon perforation s/p ex lap with segmental TC resection with end colostomy.  Patient today presenting with ***.  Patient was educated on gentle *** HEP and reported understanding. Prior to current episode, patient was independent. Would benefit from skilled PT services *** x/week for *** weeks to address aforementioned impairments in order to optimize level of function.    OBJECTIVE IMPAIRMENTS: {opptimpairments:25111}.   ACTIVITY LIMITATIONS: {activitylimitations:27494}  PARTICIPATION LIMITATIONS:  {participationrestrictions:25113}  PERSONAL FACTORS: {Personal factors:25162} are also affecting patient's functional outcome.   REHAB POTENTIAL: {rehabpotential:25112}  CLINICAL DECISION MAKING: {clinical decision making:25114}  EVALUATION COMPLEXITY: {Evaluation complexity:25115}  PLAN:  PT FREQUENCY: {rehab frequency:25116}  PT DURATION: {rehab duration:25117}  PLANNED INTERVENTIONS: {rehab planned interventions:25118::97110-Therapeutic exercises,97530- Therapeutic [phone]- Neuromuscular re-education,97535- Self Rjmz,02859- Manual therapy,Patient/Family education}  PLAN FOR NEXT SESSION: ***   Slater MARLA Christians, PT 12/27/2023, 9:39 AM

## 2023-12-27 NOTE — Progress Notes (Signed)
 Pt went to HD this am in bed. Charging home [facility] equipment so it will be ready.  Pt states she would like to work with OT and the home VAC to get used to doing things with it. Discussed hiring a helper from an agency to drive her to HD until she is able to drive.  CCS feels the wound VAC best option for now for her wound.

## 2023-12-27 NOTE — Progress Notes (Signed)
 Patient complains of dizziness when standing up, is positive for orthostatic hypotension. Reports she feels much better when she's laying down.

## 2023-12-27 NOTE — Progress Notes (Signed)
 Spoke with Eleanore Hanly RN---the orientee --working with Kate Ada RN--about the need for Lopressor  12.5mg  po x 1 to be given to this patient ---had called earlier and requested this med as we did not have this dose in our pxysis And after approx 30 minutes of no arrival---called to request the dose again-- Pt was coming off of the machine shortly and it was discussed that the dose would just be given on the floor instead of down here Upon returning the pt to the floor--or waiting for her transportation---saw that the pill had arrived and I gave the Lopressor  med to her prior to her leaving Was contacted by Eleanore Hanly later as to why did I give this patient Lopressor  during HD---I explained that whether I talked to her or Kate I had explained that the patient had been at one point increasing in her heartrate in afib---130s --and that nephrology had been notified and that the med had been ordered to be given--- Explained to this nurse that from now on I would only discuss med requests with the charge RN so that there would be no misunderstanding of the plan of action

## 2023-12-27 NOTE — TOC Progression Note (Addendum)
 Transition of Care (TOC) - Progression Note   Patient to work with OT again today with home VAC connected to dressing.   Home VAC at bedside.   NCM confirmed with Artavia with [facility] they can still provide VAC dressing changes and HHPT  and HHOT . They will need an updated order with date of first VAC dressing change to be done at home and frequency of dressing changes. MD aware  Patient Details  Name: Rachel Villarreal MRN: [id number] Date of Birth: [birth year]  Transition of Care [facility]) CM/SW Contact  Athol Bolds, Powell Jansky, RN Phone Number: 12/27/2023, 9:14 AM  Clinical Narrative:       Expected Discharge Plan: Skilled Nursing Facility Barriers to Discharge: Continued Medical Work up, SNF Pending bed offer, Insurance Authorization               Expected Discharge Plan and Services   Discharge Planning Services: CM Consult Post Acute Care Choice: Home Health Living arrangements for the past 2 months: Single Family Home                 DME Arranged: N/A         HH Arranged:  (see note)           Social Drivers of Health (SDOH) Interventions SDOH Screenings   Food Insecurity: No Food Insecurity (12/13/2023)  Housing: Low Risk  (12/13/2023)  Transportation Needs: No Transportation Needs (12/13/2023)  Utilities: Not At Risk (12/13/2023)  Depression (PHQ2-9): Low Risk  (02/28/2022)  Financial Resource Strain: Low Risk  (09/02/2021)  Social Connections: Moderately Integrated (12/13/2023)  Stress: No Stress Concern Present (09/02/2021)  Tobacco Use: Medium Risk (12/13/2023)    Readmission Risk Interventions     No data to display

## 2023-12-27 NOTE — Plan of Care (Signed)

## 2023-12-27 NOTE — Consult Note (Signed)
 WOC Nurse wound follow up Vac dressing changed.  Pt denies need for pain meds and tolerated with minimal amt discomfort.  Full thickness abd wound is beefy red with irregular shape, small amt pink drainage in the tubing.  Decreasing I size; 8X4X.8cm. Scant amt pink drainage in the cannister. Wound is in close proximity to the ostomy pouch. Applied one piece black foam to cont suction. Pt will need Home Health assistance after discharge for Vac changes twice a week, and the ostomy pouch will need to be changed each time related to the close proximity to the dressing.  Home Vac machine set up and placed on the patient as requested so she can work with occupational therapy prior to discharge. Charger in the case at the bedside.   WOC team will plan to change again on Monday if patient is still in the hospital at that time.    WOC Nurse ostomy follow up Pt assisted with pouch change steps using a hand held mirror.  She states she has weakness in her hands related to previous medical conditions and I watched to be sure she could perform all steps with her limited manual dexterity.   Stoma is red and viable, 1 1/2 inches, slightly above skin level, small amt bleeding at 9:00 o'clock where a suture was irritated. Pt was able to cleanse skin, stretch and apply a barrier ring, and apply one piece pouch that had already been cut out. She does not think she would be able to cut the opening but can have some friends precut the pouches for her eventually. She was able to remove tape boarder, then open and close the Velcro to empty.  Emptied 80 cc brown liquid stool, discussed avoiding overfilling and when to call for assistance. Pt has been in dialysis most of the day.   Reviewed pouching routines. Pt feels that she will be able to empty the pouch and plans to work with occupational therapy today. I discussed that we only have one size of pouch in the hospital, but I will call [employer] [facility] and have  some one piece precut sample pouches sent to her house. She has already been placed on [employer].  Discussed ordering supplies and 5 sets of supplies ordered to her room for staff nurses use: Use Supplies: barrier ring, Gerlean # [phone] and one piece pouch Gerlean # [phone]   Enrolled patient in [employer] Discharge program: Yes, previously   WOC team will see again for another teaching session on Monday if she is still in the hospital at that time.    Thank-you,  Stephane Fought MSN, RN, CWOCN, CWCN-AP, CNS Contact Mon-Fri 0700-1500: [phone]

## 2023-12-27 NOTE — Progress Notes (Signed)
   12/27/23 1207  Vitals  Temp 98 F (36.7 C)  Pulse Rate 100  Resp 18  BP (!) 128/58  SpO2 99 %  O2 Device Room Air  Oxygen Therapy  Patient Activity (if Appropriate) In bed  Pulse Oximetry Type Continuous  Oximetry Probe Site Changed No  Post Treatment  Dialyzer Clearance Lightly streaked  Hemodialysis Intake (mL) 0 mL  Liters Processed 67.1  Fluid Removed (mL) 1000 mL  Tolerated HD Treatment Yes  AVG/AVF Arterial Site Held (minutes) 10 minutes  AVG/AVF Venous Site Held (minutes) 10 minutes   Received patient in bed to unit.  Alert and oriented.  Informed consent signed and in chart.   TX duration:3.15  Patient tolerated well.  Transported back to the room  Alert, without acute distress.  Hand-off given to patient's nurse.   Access used: RUAG Access issues: no complications  Total UF removed: 1000 Medication(s) given: lopressor  12.5mg  po x 1   [facility]

## 2023-12-27 NOTE — Progress Notes (Signed)
 While doing bedside rounds at the end of our shift, patient told us  (offgoing and incoming nurses) that she had good news-that she had spoken with company owners from [employer] about getting some assistance post discharge. The owners are coming to talk with her tomorrow to set things up and discuss her needs. Patient stated that to start with, she feels she needs about 4 hours daily and that the assistance would include driving her to dialysis. She asked if we felt that would be a good plan and we agreed that it sounded wonderful. Patient has the home wound VAC going presently so she can get used to it. It is operating correctly. She stated she feels good about going home on an off HD day as Dr. Arlice suggested would be a better option than discharge on a HD day.

## 2023-12-27 NOTE — Progress Notes (Signed)
 Occupational Therapy Treatment Patient Details Name: Rachel Villarreal MRN: [id number] DOB: [birth year] Today's Date: 12/27/2023   History of present illness 78 y.o. female admitted on 12/13/23 s/p ex lap with segmental TC resection with end colostomy, abdominal wall debridement, and ventral incisional hernia repair with vicryl mesh, Dr. Dasie 12/13/2023 for transverse colon perforation within an incarcerated ventral hernia  PMH: HTN, ESRD on HD, breast cancer, CAD, HFimpEF, PAF on eliquis .   OT comments  Adjusted strap of wound vac carrying bag to wear on one shoulder and as a cross body to determine which is more comfortable and convenient for pt. Ambulated in room with pt reporting feeling light headed. Pt with hypotension in standing, see flow sheet for specifics, RN in room. Demonstrated to pt sitting forward vs backward on toilet to empty colostomy. Unable to mobilize further due to hypotension.       If plan is discharge home, recommend the following:  Assistance with cooking/housework;Help with stairs or ramp for entrance;A little help with bathing/dressing/bathroom   Equipment Recommendations  None recommended by OT    Recommendations for Other Services      Precautions / Restrictions Precautions Precautions: Fall;Other (comment) Recall of Precautions/Restrictions: Intact Precaution/Restrictions Comments: abdominal precautions/wound vac, ostomy, watch BP Required Braces or Orthoses: Other Brace Other Brace: abdominal binder Restrictions Weight Bearing Restrictions Per Provider Order: No       Mobility Bed Mobility Overal bed mobility: Modified Independent                  Transfers Overall transfer level: Needs assistance Equipment used: None Transfers: Sit to/from Stand Sit to Stand: Supervision                 Balance Overall balance assessment: Mild deficits observed, not formally tested                                         ADL  either performed or assessed with clinical judgement   ADL Overall ADL's : Needs assistance/impaired                 Upper Body Dressing : Set up;Sitting   Lower Body Dressing: Set up;Sitting/lateral leans               Functional mobility during ADLs: Supervision/safety General ADL Comments: Pt with lightheadedness with ambulation in room.    Extremity/Trunk Assessment              Vision       Restaurant manager, fast food Communication: No apparent difficulties   Cognition Arousal: Alert Behavior During Therapy: WFL for tasks assessed/performed Cognition: No apparent impairments                               Following commands: Intact        Cueing   Cueing Techniques: Verbal cues  Exercises      Shoulder Instructions       General Comments      Pertinent Vitals/ Pain       Pain Assessment Pain Assessment: No/denies pain  Home Living  Prior Functioning/Environment              Frequency  Min 2X/week        Progress Toward Goals  OT Goals(current goals can now be found in the care plan section)  Progress towards OT goals: Not progressing toward goals - comment (orthostatic)  Acute Rehab OT Goals OT Goal Formulation: With patient Time For Goal Achievement: 01/04/24 Potential to Achieve Goals: Good  Plan      Co-evaluation                 AM-PAC OT 6 Clicks Daily Activity     Outcome Measure   Help from another person eating meals?: None Help from another person taking care of personal grooming?: A Little Help from another person toileting, which includes using toliet, bedpan, or urinal?: A Little Help from another person bathing (including washing, rinsing, drying)?: A Little Help from another person to put on and taking off regular upper body clothing?: None Help from another person to put on and taking off  regular lower body clothing?: A Little 6 Click Score: 20    End of Session    OT Visit Diagnosis: Muscle weakness (generalized) (M62.81)   Activity Tolerance Treatment limited secondary to medical complications (Comment) (orthostatic hypotension in standing)   Patient Left in bed;with call bell/phone within reach;with nursing/sitter in room   Nurse Communication Other (comment) (aware of pt' symptomatic hypotension)        Time: 1354-1430 OT Time Calculation (min): 36 min  Charges: OT General Charges $OT Visit: 1 Visit OT Treatments $Self Care/Home Management : 23-37 mins  Mliss HERO, OTR/L Acute Rehabilitation Services Office: [phone]   Kennth Mliss Helling 12/27/2023, 2:34 PM

## 2023-12-27 NOTE — Progress Notes (Signed)
 PROGRESS NOTE  Rachel Villarreal  DOB: 1945-08-15  PCP: Rachel Other, MD FMW:[id number]  DOA: 12/13/2023  LOS: 14 days  Hospital Day: 15  Brief narrative: Rachel Villarreal is a 78 y.o. 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  About a week prior, she noticed some redness over the hernia, went to her PCP and was started on doxycycline .  The day prior to presentation, she noticed that the area had blackened and in the next 24 hours, started to drain pus hence he came to the ED on 9/4. CT abdomen pelvis showed left paracentral umbilical hernia containing a knuckle of transverse colon or a Richter hernia with surrounding abnormal extraluminal gas in the hernia sac indicating perforated viscus.  Seen by general surgery, diagnosed to have incarcerated ventral incisional hernia with colonic perforation. Underwent emergency exploratory laparotomy with segmental transverse colon resection, creation of end transverse colostomy, abdominal wall debridement, ventral incisional hernia repair. Postop, admitted to [facility]  Subjective: Patient was seen and examined this afternoon. Not in distress. Underwent dialysis this morning.  Blood pressure in normal range. Disposition plan pending  Assessment and plan: Ventral incisional hernia with skin necrosis secondary to bowel perforation S/p ex lap, colon resection, colostomy, right mid and ventral hernia repair - Dr. Leonor 9/4 Completed course of IV Zosyn  Postop ileus improved. Bowel function returned.   Colostomy bag with greenish output.  Nutrition consult appreciated. On Imodium  Currently has a wound VAC in place on the umbilical wound Continue management per surgery, wound VAC tchanged on 12/21/2023.[name]  She also had ostomy teaching   ESRD-HD-TTS  Nephrology following Diuretics on hold   H/o  SAH -  Had a fall with SAH secondary to Eliquis . Hospitalized 8/28 -8/30 under neurosurgery Currently anticoagulation on hold.  Sutures removed from patient's head on 12/23/2023.   Paroxysmal A-fib  Currently on Lopressor  12.5 mg mg twice daily.  Heart rate and blood pressure good  Eliquis  on hold after [facility].   H/o CAD, HLD No anginal symptoms  Not on any blood thinner  Continue Lipitor    Restless leg syndrome  Chronic insomnia Continue Requip  as before.  Also on Ambien  nightly   GERD - Continue Protonix .   H/o breast cancer  On anastrozole .  H/o gout Continue Uloric    Mobility:  PT Orders: Active   PT Follow up Rec: Skilled Nursing-Short Term Rehab (<3 Hours/Day)12/26/2023 [phone]   Goals of care   Code Status: Full Code     DVT prophylaxis:  heparin  injection 5,000 Units Start: 12/14/23 1400 SCDs Start: 12/14/23 0336   Antimicrobials: None Fluid: None Consultants: Nephrology Family Communication: None at bedside  Status: Inpatient Level of care:  Telemetry Surgical   Patient is from: Home Needs to continue in-hospital care: Pending SNF.  Peer to peer failed.  Likely home with home health but arrangements in process.  She lives alone and unable to handle wound care, wound VAC and ostomy by self    Diet:  Diet Order             DIET SOFT Room service appropriate? Yes; Fluid consistency: Thin  Diet effective now                   Scheduled Meds:  acetaminophen   1,000 mg Oral Q6H   atorvastatin   80 mg  Oral q AM   Chlorhexidine  Gluconate Cloth  6 each Topical Daily   Chlorhexidine  Gluconate Cloth  6 each Topical Q0600   darbepoetin (ARANESP ) injection - DIALYSIS  60 mcg Subcutaneous Q Sat-1800   febuxostat   40 mg Oral q AM   feeding supplement (NEPRO CARB STEADY)  237 mL Oral BID BM   heparin  injection (subcutaneous)  5,000 Units Subcutaneous Q8H   loperamide   2 mg Oral Daily   metoprolol  tartrate  12.5 mg Oral BID   pantoprazole   40 mg  Oral Daily   rOPINIRole   0.5 mg Oral BID   sevelamer  carbonate  1,600 mg Oral TID WC    PRN meds: albuterol , artificial tears, HYDROmorphone  (DILAUDID ) injection, methocarbamol , ondansetron  (ZOFRAN ) IV, traMADol , zolpidem    Infusions:     Antimicrobials: Anti-infectives (From admission, onward)    Start     Dose/Rate Route Frequency Ordered Stop   12/13/23 2200  piperacillin -tazobactam (ZOSYN ) IVPB 2.25 g        2.25 g 100 mL/hr over 30 Minutes Intravenous Every 8 hours 12/13/23 2113 12/19/23 0847   12/13/23 2111  piperacillin -tazobactam (ZOSYN ) 3.375 GM/50ML IVPB       Note to Pharmacy: Golob, Jamie C: cabinet override      12/13/23 2111 12/14/23 0914   12/13/23 1415  piperacillin -tazobactam (ZOSYN ) IVPB 3.375 g        3.375 g 100 mL/hr over 30 Minutes Intravenous  Once 12/13/23 1413 12/13/23 1653       Objective: Vitals:   12/27/23 1421 12/27/23 1423  BP: 129/62 137/60  Pulse:    Resp:    Temp:    SpO2:      Intake/Output Summary (Last 24 hours) at 12/27/2023 1458 Last data filed at 12/27/2023 1400 Gross per 24 hour  Intake --  Output 1280 ml  Net -1280 ml   Filed Weights   12/25/23 0752 12/25/23 0755 12/27/23 0828  Weight: (P) 76 kg 76 kg 75.8 kg   Weight change:  Body mass index is 30.56 kg/m.   Physical Exam: General exam: Pleasant, elderly Caucasian female.  Not in distress Skin: No rashes, lesions or ulcers. HEENT: Atraumatic, normocephalic, no obvious bleeding Lungs: Clear to auscultation bilaterally,  CVS: S1, S2, no murmur,   GI/Abd: Soft, nontender, nondistended, bowel sound present, ostomy with greenish output.  Wound VAC in place. CNS: Alert, awake, oriented x 3 Psychiatry: Anxious because of not getting insurance authorization for SNF Extremities: No pedal edema, no calf tenderness,   Data Review: I have personally reviewed the laboratory data and studies available.  F/u labs ordered Unresulted Labs (From admission, onward)    None        Signed, Chapman Rota, MD [facility] 12/27/2023

## 2023-12-27 NOTE — Progress Notes (Signed)
  [facility] Progress Note   Subjective:    Seen in room No c/o's    Objective Vitals:   12/27/23 1000 12/27/23 1030 12/27/23 1100 12/27/23 1130  BP: 123/68 (!) 111/56 92/71 120/70  Pulse: 100 (!) 103 98 (!) 101  Resp: (!) 22 (!) 33 (!) 24 15  Temp:      TempSrc:      SpO2: 97% 98% 95% 100%  Weight:      Height:       Physical Exam General: Awake, alert, on RA, NAD ENT: dry MM Heart: S1 and S2; No wheezing, gallops, or rubs Abd: ostomy in place, nontender abd Lungs: Clear anteriorly Extremities: no edema bilat  Dialysis Access: R AVF +t/b   OP HD: [facility] T[facility] 3h   B350   68.7kg   2K bath  AVF   Heparin  none Last hd 9/02 post wt 69.9kg  Mircera 30 mcg q 4, last 8/26   Home bp meds: Metoprolol  xl 50 hs Lasix  80 mg non hd days  Assessment/Plan:  Perforated bowel due to incarcerated hernia: S/p partial colectomy w/ colostomy creation and incisional hernia repair on 9/4. Pip/tazo completed 9/9. Per pmd/ surgery. Wound vac in place ESRD: on HD TTS.  HD today.  BP: bp's acceptable, follow.  Atrial fib: getting metoprolol  25 bid  Volume: wt's are off, UF 2- 3 L as tolerated Anemia of esrd: Hb 8- 10 here. Follow, transfuse prn.  Aranesp  60 mcg sq weekly was started due to Hb in the 8s consistently.  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 No VDRA. Continue binders.  Myer Fret  MD  [facility] 12/27/2023, 11:52 AM  Recent Labs  Lab 12/23/23 0757 12/24/23 0245 12/27/23 0444  HGB 8.7*  --  8.5*  CALCIUM  9.4 9.3 9.8  CREATININE 4.44* 5.75* 6.56*  K 3.8 4.5 4.6    Inpatient medications:  acetaminophen   1,000 mg Oral Q6H   atorvastatin   80 mg Oral q AM   Chlorhexidine  Gluconate Cloth  6 each Topical Daily   Chlorhexidine  Gluconate Cloth  6 each Topical Q0600   darbepoetin (ARANESP ) injection - DIALYSIS  60 mcg Subcutaneous Q Sat-1800   febuxostat   40 mg Oral q AM   feeding supplement (NEPRO CARB STEADY)  237 mL Oral BID BM   heparin  injection (subcutaneous)   5,000 Units Subcutaneous Q8H   loperamide   2 mg Oral Daily   metoprolol  tartrate  12.5 mg Oral BID   pantoprazole   40 mg Oral Daily   rOPINIRole   0.5 mg Oral BID   sevelamer  carbonate  1,600 mg Oral TID WC    anticoagulant sodium citrate      albuterol , alteplase , anticoagulant sodium citrate , artificial tears, feeding supplement (NEPRO CARB STEADY), heparin , HYDROmorphone  (DILAUDID ) injection, lidocaine  (PF), lidocaine -prilocaine , methocarbamol , ondansetron  (ZOFRAN ) IV, pentafluoroprop-tetrafluoroeth, traMADol , zolpidem

## 2023-12-27 NOTE — Plan of Care (Signed)
  Problem: Education: Goal: Knowledge of General Education information will improve Description: Including pain rating scale, medication(s)/side effects and non-pharmacologic comfort measures Outcome: Progressing   Problem: Health Behavior/Discharge Planning: Goal: Ability to manage health-related needs will improve Outcome: Progressing   Problem: Activity: Goal: Risk for activity intolerance will decrease Outcome: Progressing   Problem: Nutrition: Goal: Adequate nutrition will be maintained Outcome: Progressing   Problem: Coping: Goal: Level of anxiety will decrease Outcome: Progressing   Problem: Elimination: Goal: Will not experience complications related to bowel motility Outcome: Progressing   Problem: Pain Managment: Goal: General experience of comfort will improve and/or be controlled Outcome: Progressing

## 2023-12-28 ENCOUNTER — Other Ambulatory Visit (HOSPITAL_COMMUNITY): Payer: Self-pay

## 2023-12-28 DIAGNOSIS — K631 Perforation of intestine (nontraumatic): Secondary | ICD-10-CM | POA: Diagnosis not present

## 2023-12-28 MED ORDER — TRAMADOL HCL 50 MG PO TABS
50.0000 mg | ORAL_TABLET | Freq: Two times a day (BID) | ORAL | 0 refills | Status: AC | PRN
  Filled 2023-12-28: qty 10, 5d supply, fill #0

## 2023-12-28 MED ORDER — NEPRO/CARBSTEADY PO LIQD: 237.0000 mL | Freq: Two times a day (BID) | ORAL | Status: DC

## 2023-12-28 MED ORDER — LOPERAMIDE HCL 2 MG PO CAPS
2.0000 mg | ORAL_CAPSULE | Freq: Three times a day (TID) | ORAL | 0 refills | Status: AC | PRN
  Filled 2023-12-28: qty 30, 10d supply, fill #0

## 2023-12-28 MED ORDER — METOPROLOL SUCCINATE ER 25 MG PO TB24
25.0000 mg | ORAL_TABLET | Freq: Every day | ORAL | 0 refills | Status: DC
  Filled 2023-12-28: qty 90, 90d supply, fill #0

## 2023-12-28 NOTE — Progress Notes (Signed)
 Contacted RN CM to inquire about possible d/c date for pt. Navigator advised pt will d/c today per MD. Contacted [facility] to be advised of pt's d/c today and that pt should resume care tomorrow.   Randine Mungo Dialysis Navigator [phone]

## 2023-12-28 NOTE — Progress Notes (Signed)
 Mobility Specialist Progress Note:    12/28/23 1449  Mobility  Activity Stood at bedside;Dangled on edge of bed  Level of Assistance Standby assist, set-up cues, supervision of patient - no hands on  Assistive Device Front wheel walker  Activity Response Tolerated well  Mobility Referral Yes  Mobility visit 1 Mobility  Mobility Specialist Start Time (ACUTE ONLY) 1430  Mobility Specialist Stop Time (ACUTE ONLY) 1440  Mobility Specialist Time Calculation (min) (ACUTE ONLY) 10 min   Receive pt EOB and agreeable to mobility. No physical assistance needed. Pt completed x1 STS and returned supine in bed. Pt about to discharge so did not want to ambulate. Personal belongings and call light within reach. All needs met.  Lavanda Pollack Mobility Specialist  Please contact via [employer] or  Rehab Office [phone]

## 2023-12-28 NOTE — Progress Notes (Signed)
 Physical Therapy Treatment Patient Details Name: Rachel Villarreal MRN: [id number] DOB: July 06, 1945 Today's Date: 12/28/2023   History of Present Illness 78 y.o. female admitted on 12/13/23 s/p ex lap with segmental TC resection with end colostomy, abdominal wall debridement, and ventral incisional hernia repair with vicryl mesh, Dr. Dasie 12/13/2023 for transverse colon perforation within an incarcerated ventral hernia  PMH: HTN, ESRD on HD, breast cancer, CAD, HFimpEF, PAF on eliquis .    PT Comments  Pt resting in bed on arrival, pleasant and eager for mobility and demonstrating continued progress towards cute goals. Pt able to manage home wound vac and carrying case throughout session, discussion and problem solving with pt regarding ordering and placing a RW bag/caddie on RW to hold vac when up and moving around her home as pt continues to state the current bag is heavy on her shoulder. Pt demonstrating bed mobility, transfers and gait with grossly supervision with RW for support. Pt was educated on continued walker use to maximize functional independence, safety, and decrease risk for falls. Pt continues to benefit from skilled PT services to progress toward functional mobility goals.     If plan is discharge home, recommend the following: Assist for transportation;A little help with walking and/or transfers;A little help with bathing/dressing/bathroom;Help with stairs or ramp for entrance;Assistance with cooking/housework   Can travel by private vehicle     Yes  Equipment Recommendations  None recommended by PT    Recommendations for Other Services       Precautions / Restrictions Precautions Precautions: Fall;Other (comment) Recall of Precautions/Restrictions: Intact Precaution/Restrictions Comments: abdominal precautions/wound vac, ostomy, watch BP Required Braces or Orthoses: Other Brace Other Brace: abdominal binder Restrictions Weight Bearing Restrictions Per Provider Order: No      Mobility  Bed Mobility Overal bed mobility: Modified Independent Bed Mobility: Sit to Supine, Supine to Sit     Supine to sit: Supervision Sit to supine: Supervision   General bed mobility comments: pt able to mange home wound vac    Transfers Overall transfer level: Needs assistance Equipment used: None Transfers: Sit to/from Stand Sit to Stand: Supervision           General transfer comment: supervision for safety and wound vac line    Ambulation/Gait Ambulation/Gait assistance: Supervision Gait Distance (Feet): 240 Feet Assistive device: Rolling walker (2 wheels) Gait Pattern/deviations: Step-through pattern, Decreased stride length Gait velocity: decreased     General Gait Details: grossly supervision for safety, no overt LOB,   Stairs             Wheelchair Mobility     Tilt Bed    Modified Rankin (Stroke Patients Only)       Balance Overall balance assessment: Mild deficits observed, not formally tested                                          Communication Communication Communication: No apparent difficulties  Cognition Arousal: Alert Behavior During Therapy: WFL for tasks assessed/performed   PT - Cognitive impairments: No apparent impairments                         Following commands: Intact      Cueing Cueing Techniques: Verbal cues  Exercises      General Comments General comments (skin integrity, edema, etc.): VSS on RA, BP 145/62 post ambulation, discuseed RW  bag/caddy to be able to carry wound vac vs on shoulder      Pertinent Vitals/Pain Pain Assessment Pain Assessment: No/denies pain    Home Living                          Prior Function            PT Goals (current goals can now be found in the care plan section) Acute Rehab PT Goals Patient Stated Goal: home PT Goal Formulation: With patient Time For Goal Achievement: 12/29/23 Progress towards PT goals:  Progressing toward goals    Frequency    Min 2X/week      PT Plan      Co-evaluation              AM-PAC PT 6 Clicks Mobility   Outcome Measure  Help needed turning from your back to your side while in a flat bed without using bedrails?: None Help needed moving from lying on your back to sitting on the side of a flat bed without using bedrails?: None Help needed moving to and from a bed to a chair (including a wheelchair)?: A Little Help needed standing up from a chair using your arms (e.g., wheelchair or bedside chair)?: A Little Help needed to walk in hospital room?: A Little Help needed climbing 3-5 steps with a railing? : A Lot 6 Click Score: 19    End of Session   Activity Tolerance: Patient tolerated treatment well Patient left: in bed;with call bell/phone within reach Nurse Communication: Mobility status PT Visit Diagnosis: Other abnormalities of gait and mobility (R26.89);Unsteadiness on feet (R26.81)     Time: 9082-9065 PT Time Calculation (min) (ACUTE ONLY): 17 min  Charges:    $Gait Training: 8-22 mins PT General Charges $$ ACUTE PT VISIT: 1 Visit                     [name] R. PTA Acute Rehabilitation Services Office: [phone]   [name] 12/28/2023, 1:36 PM

## 2023-12-28 NOTE — Discharge Planning (Signed)
 [facility] Patient Discharge Orders- [facility] CLINIC: [place]  Patient's name: [name] Admit/DC Dates: 12/13/2023 - 12/28/2023  Discharge Diagnoses: Ventral incisional hernia/bowel perforation. S/p ex lap with colon resection and colostomy   S/p ventral hernia repair with wound VAC in place   Outpatient Dialysis Orders:  -Heparin : None  -EDW: 69 kg. May need weight adjustment  -Bath: No change   Anemia Aranesp : Given: Y    Date of last dose/amount: 9/13   60 mcg    PRBC's Given: -- Date/# of units: -- ESA dose for discharge: Mircera 60 mcg IV q 2 weeks --Due 9/20  Recent Labs  Lab 12/27/23 0444  HGB 8.5*  K 4.6  CALCIUM  9.8    Access intervention/Change: none   Medications: -IV Antibiotics: -- -Anticoagulation: ---   OTHER/APPTS/LABS    Completed by: Maisie Ronnald Acosta PA-C   D/C Meds to be reconciled by nurse after every discharge.    Reviewed by: MD:______ RN_______

## 2023-12-28 NOTE — Plan of Care (Signed)
 Pt d/c to home with follow up dialysis and OT/PT. BP (!) 133/55   Pulse 99   Temp 98 F (36.7 C) (Oral)   Resp 18   Ht 5' 2 (1.575 m)   Wt 75.8 kg   SpO2 95%   BMI 30.56 kg/m  IV removed belongings returned. AVS reviewed, follow up questions answered, pt d/c home to friend. Rachel Villarreal 12/28/23

## 2023-12-28 NOTE — TOC Progression Note (Signed)
 Transition of Care (TOC) - Progression Note   Patient for discharge to home today.   She has arranged transportation to dialysis .    Home VAC already connected.   Artavia with [facility] aware . They will make arrangement to change home [facility] Monday 12/31/23, and also schedule HHPT,OT . Patient received a message to schedule an appointment for OP PT. Patient aware she cannot have home health services and OP PT at the same time. Once home health no longer needed , if she needs OP PT PCP can order   Randine Mungo also aware discharge is today .    Patient Details  Name: Rachel Villarreal MRN: [id number] Date of Birth: 1945-05-08  Transition of Care [facility] CM/SW Contact  Daisia Slomski, Powell Jansky, RN Phone Number: 12/28/2023, 1:04 PM  Clinical Narrative:       Expected Discharge Plan: Skilled Nursing Facility Barriers to Discharge: Continued Medical Work up, SNF Pending bed offer, Insurance Authorization               Expected Discharge Plan and Services   Discharge Planning Services: CM Consult Post Acute Care Choice: Home Health Living arrangements for the past 2 months: Single Family Home                 DME Arranged: N/A         HH Arranged:  (see note)           Social Drivers of Health (SDOH) Interventions SDOH Screenings   Food Insecurity: No Food Insecurity (12/13/2023)  Housing: Low Risk  (12/13/2023)  Transportation Needs: No Transportation Needs (12/13/2023)  Utilities: Not At Risk (12/13/2023)  Depression (PHQ2-9): Low Risk  (02/28/2022)  Financial Resource Strain: Low Risk  (09/02/2021)  Social Connections: Moderately Integrated (12/13/2023)  Stress: No Stress Concern Present (09/02/2021)  Tobacco Use: Medium Risk (12/13/2023)    Readmission Risk Interventions     No data to display

## 2023-12-28 NOTE — Discharge Summary (Signed)
 Physician Discharge Summary  Rachel Villarreal FMW:[id number] DOB: 1945-06-25 DOA: 12/13/2023  PCP: Ransom Other, MD  Admit date: 12/13/2023 Discharge date: 12/28/2023  Admitted from: Home Discharge disposition: Home with home health  Recommendations at discharge:  Toprol  dose has been reduced to 25 mg daily Diuretics has been stopped Imodium  PRN Follow-up with general surgery as an outpatient for wound VAC management. Follow-up with neurosurgery as an outpatient for discussion on reinitiation of Eliquis    Brief narrative: Rachel Villarreal is a 78 y.o. 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  About a week prior, she noticed some redness over the hernia, went to her PCP and was started on doxycycline .  The day prior to presentation, she noticed that the area had blackened and in the next 24 hours, started to drain pus hence he came to the ED on 9/4. CT abdomen pelvis showed left paracentral umbilical hernia containing a knuckle of transverse colon or a Richter hernia with surrounding abnormal extraluminal gas in the hernia sac indicating perforated viscus.  Seen by general surgery, diagnosed to have incarcerated ventral incisional hernia with colonic perforation. Underwent emergency exploratory laparotomy with segmental transverse colon resection, creation of end transverse colostomy, abdominal wall debridement, ventral incisional hernia repair. Postop, admitted to [facility]  Subjective: Patient was seen and examined this morning. Sitting up in bed.  Not in distress She has arranged home health aide services Feels ready to go home today.  Hospital course: Ventral incisional hernia with skin necrosis secondary to bowel perforation S/p ex lap, colon resection, colostomy, right mid and ventral hernia repair - Dr. Leonor  9/4 Completed course of IV Zosyn  Postop ileus improved. Bowel function returned.   Colostomy bag with greenish output.  Nutrition consult appreciated. Imodium  as needed to continue Currently has a wound VAC in place on the umbilical wound Continue management per surgery, wound VAC tchanged on 12/21/2023.Rachel Villarreal  She also had ostomy teaching   Paroxysmal A-fib  HTN PTA meds- Toprol , Lasix   Diuresis was held.  Toprol  dose reduced because of low blood pressure probably due to blood from colostomy.   Currently heart rate and blood pressure stable on Lopressor  12.5 mg mg twice daily. At discharge, will start her on Toprol  25 mg daily. Eliquis  has been on hold since [facility]. To follow-up with neurosurgery as an outpatient as planned before   H/o SAH -  Had a fall with SAH secondary to Eliquis . Hospitalized 8/28 -8/30 under neurosurgery Currently anticoagulation on hold.  Sutures removed from patient's head on 12/23/2023.  ESRD-HD-TTS  Nephrology saw her in the hospital.  On regular dialysis schedule.    H/o CAD, HLD No anginal symptoms  Not on any blood thinner  Continue Lipitor    Restless leg syndrome  Chronic insomnia Continue Requip  and Ambien  nightly   GERD - Continue Protonix .   H/o breast cancer  On anastrozole .  H/o gout Continue Uloric    Mobility:  PT Orders: Active   PT Follow up Rec: Skilled Nursing-Short Term Rehab (<3 Hours/Day)12/26/2023 [phone]   Goals of care   Code Status: Full Code   Diet:  Diet Order             Diet general           DIET SOFT Room service appropriate? Yes; Fluid consistency: Thin  Diet effective now  Nutritional status:  Body mass index is 30.56 kg/m.       Wounds:  - Wound 12/13/23 2118 Surgical Closed Surgical Incision Abdomen (Active)  Date First Assessed/Time First Assessed: 12/13/23 2118   Primary Wound Type: Surgical  Secondary Wound Type - Surgical: Closed Surgical Incision  Location: Abdomen  Wound  Description (Comments): medipore tapeColostomy appliance    Assessments 12/13/2023  9:44 PM 12/28/2023  9:00 AM  Site / Wound Assessment Dressing in place / Unable to assess Clean;Dry  Closure -- None  Drainage Description -- Serosanguineous  Drainage Amount None None  Dressing Type Gauze (Comment);Abdominal pads;Other (Comment) Negative pressure wound therapy  Dressing Changed -- Reinforced  Dressing Status Clean, Dry, Intact Clean, Dry, Intact  Margins -- Unattached edges (unapproximated)  Treatment -- Negative pressure wound therapy     No associated orders.    Discharge Exam:   Vitals:   12/27/23 1538 12/27/23 2025 12/28/23 0446 12/28/23 0847  BP: (!) 119/54 125/69 (!) 134/53 (!) 133/55  Pulse: 97 100 89 99  Resp: 17  18 18   Temp: 98.3 F (36.8 C) 98.1 F (36.7 C) 98.3 F (36.8 C) 98 F (36.7 C)  TempSrc: Oral Oral Oral Oral  SpO2: 95% 95% 98% 95%  Weight:      Height:        Body mass index is 30.56 kg/m.  General exam: Pleasant, elderly Caucasian female.  Not in distress Skin: No rashes, lesions or ulcers. HEENT: Atraumatic, normocephalic, no obvious bleeding Lungs: Clear to auscultation bilaterally,  CVS: S1, S2, no murmur,   GI/Abd: Soft, nontender, nondistended, bowel sound present, ostomy with greenish output.  Wound VAC in place. CNS: Alert, awake, oriented x 3 Psychiatry: Mood appropriate Extremities: No pedal edema, no calf tenderness,   Follow ups:    Contact information for follow-up providers     [facility]  Follow up.   Contact information: [address] [place] [address] [phone]         Dasie Leonor CROME, MD. Go on 01/07/2024.   Specialty: General Surgery Why: Please call to confirm your follow up appointment with Dr. Dasie scheduled for 2:20PM; Arrive 30 minutes prior to your scheduled appointment time and bring your ID/insurance cards. Contact information: [address] [place]  [address] [phone]              Contact information for after-discharge care     Destination     [facility] .   Service: Skilled Nursing Contact information: [address] [place]  [address] [phone]                     Discharge Instructions:   Discharge Instructions     Ambulatory referral to Wound Clinic   Complete by: As directed    Outpatient referral to ostomy clinic upon discharge   Call MD for:  difficulty breathing, headache or visual disturbances   Complete by: As directed    Call MD for:  extreme fatigue   Complete by: As directed    Call MD for:  hives   Complete by: As directed    Call MD for:  persistant dizziness or light-headedness   Complete by: As directed    Call MD for:  persistant nausea and vomiting   Complete by: As directed    Call MD for:  severe uncontrolled pain   Complete by: As directed    Call MD  for:  temperature >100.4   Complete by: As directed    Diet general   Complete by: As directed    Discharge instructions   Complete by: As directed    Recommendations at discharge:   Toprol  dose has been reduced to 25 mg daily  Diuretics has been stopped  Imodium  PRN  Follow-up with general surgery as an outpatient for wound VAC management.  Follow-up with neurosurgery as an outpatient for discussion on reinitiation of Eliquis   PDMP reviewed this encounter.   Opioid taper instructions: It is important to wean off of your opioid medication as soon as possible. If you do not need pain medication after your surgery it is ok to stop day one. Opioids include: Codeine, Hydrocodone(Norco, Vicodin), Oxycodone (Percocet, oxycontin ) and hydromorphone  amongst others.  Long term and even short term use of opiods can cause: Increased pain response Dependence Constipation Depression Respiratory depression And more.  Withdrawal symptoms can include Flu like symptoms Nausea, vomiting And  more Techniques to manage these symptoms Hydrate well Eat regular healthy meals Stay active Use relaxation techniques(deep breathing, meditating, yoga) Do Not substitute Alcohol  to help with tapering If you have been on opioids for less than two weeks and do not have pain than it is ok to stop all together.  Plan to wean off of opioids This plan should start within one week post op of your joint replacement. Maintain the same interval or time between taking each dose and first decrease the dose.  Cut the total daily intake of opioids by one tablet each day Next start to increase the time between doses. The last dose that should be eliminated is the evening dose.        General discharge instructions: Follow with Primary MD Ransom Other, MD in 7 days  Please request your PCP  to go over your hospital tests, procedures, radiology results at the follow up. Please get your medicines reviewed and adjusted.  Your PCP may decide to repeat certain labs or tests as needed. Do not drive, operate heavy machinery, perform activities at heights, swimming or participation in water activities or provide baby sitting services if your were admitted for syncope or siezures until you have seen by Primary MD or a Neurologist and advised to do so again. [place]  Controlled Substance Reporting System database was reviewed. Do not drive, operate heavy machinery, perform activities at heights, swim, participate in water activities or provide baby-sitting services while on medications for pain, sleep and mood until your outpatient physician has reevaluated you and advised to do so again.  You are strongly recommended to comply with the dose, frequency and duration of prescribed medications. Activity: As tolerated with Full fall precautions use walker/cane & assistance as needed Avoid using any recreational substances like cigarette, tobacco, alcohol , or non-prescribed drug. If you experience worsening of  your admission symptoms, develop shortness of breath, life threatening emergency, suicidal or homicidal thoughts you must seek medical attention immediately by calling 911 or calling your MD immediately  if symptoms less severe. You must read complete instructions/literature along with all the possible adverse reactions/side effects for all the medicines you take and that have been prescribed to you. Take any new medicine only after you have completely understood and accepted all the possible adverse reactions/side effects.  Wear Seat belts while driving. You were cared for by a hospitalist during your hospital stay. If you have any questions about your discharge medications or the care you received while you were in the  hospital after you are discharged, you can call the unit and ask to speak with the hospitalist or the covering physician. Once you are discharged, your primary care physician will handle any further medical issues. Please note that NO REFILLS for any discharge medications will be authorized once you are discharged, as it is imperative that you return to your primary care physician (or establish a relationship with a primary care physician if you do not have one).   Discharge wound care:   Complete by: As directed    Increase activity slowly   Complete by: As directed        Discharge Medications:   Allergies as of 12/28/2023       Reactions   Ace Inhibitors    angioedema   Lactose Intolerance (gi) Other (See Comments)   GI upset   Letrozole  Hives   Lisinopril Swelling   Mercury    Red eyes, through eye drops that contained thimerosal    Tape Dermatitis   plastic        Medication List     STOP taking these medications    doxycycline  100 MG tablet Commonly known as: VIBRA -TABS   furosemide  80 MG tablet Commonly known as: LASIX        TAKE these medications    acetaminophen  500 MG tablet Commonly known as: TYLENOL  Take 500 mg by mouth every 6 (six) hours  as needed for mild pain (pain score 1-3).   anastrozole  1 MG tablet Commonly known as: ARIMIDEX  TAKE 1 TABLET BY MOUTH EVERY DAY   atorvastatin  80 MG tablet Commonly known as: LIPITOR  Take 1 tablet (80 mg total) by mouth in the morning.   budesonide  32 MCG/ACT nasal spray Commonly known as: RHINOCORT  AQUA Place 2 sprays into both nostrils in the morning.   Cholecalciferol 50 MCG (2000 UT) Caps Take 2,000 Units by mouth in the morning.   Claritin  10 MG tablet Generic drug: loratadine  Take 10 mg by mouth 2 (two) times daily.   docusate sodium  100 MG capsule Commonly known as: COLACE Take 100 mg by mouth daily as needed for mild constipation.   eszopiclone 2 MG Tabs tablet Commonly known as: LUNESTA Take 2 mg by mouth at bedtime.   febuxostat  40 MG tablet Commonly known as: ULORIC  Take 40 mg by mouth in the morning.   feeding supplement (NEPRO CARB STEADY) Liqd Take 237 mLs by mouth 2 (two) times daily between meals.   loperamide  2 MG tablet Commonly known as: Imodium  A-D Take 1 tablet (2 mg total) by mouth 3 (three) times daily as needed for diarrhea or loose stools.   metoprolol  succinate 25 MG 24 hr tablet Commonly known as: Toprol  XL Take 1 tablet (25 mg total) by mouth daily. What changed:  medication strength how much to take when to take this   MIRCERA IJ 60 mcg.   ondansetron  4 MG tablet Commonly known as: ZOFRAN  Take 4 mg by mouth See admin instructions. Take 1 tablet by mouth on dialysis days, during dialysis   pantoprazole  40 MG tablet Commonly known as: PROTONIX  Take 1 tablet (40 mg total) by mouth daily.   PRESERVISION AREDS 2+MULTI VIT PO Take 1 capsule by mouth 2 (two) times daily.   ProAir  HFA 108 (90 Base) MCG/ACT inhaler Generic drug: albuterol  Inhale 1-2 puffs into the lungs every 6 (six) hours as needed for shortness of breath or wheezing (for respiratory issues.).   rOPINIRole  0.5 MG tablet Commonly known as: REQUIP  Take 1 tablet  (  0.5 mg total) by mouth every 12 (twelve) hours as needed (Restless legs). What changed: when to take this   sevelamer  carbonate 800 MG tablet Commonly known as: RENVELA  Take 1,600 mg by mouth 3 (three) times daily with meals.   traMADol  50 MG tablet Commonly known as: ULTRAM  Take 1 tablet (50 mg total) by mouth every 12 (twelve) hours as needed for up to 5 days for moderate pain (pain score 4-6).   Voltaren  1 % Gel Generic drug: diclofenac  Sodium Apply 1 Application topically 4 (four) times daily as needed (pain.).               Durable Medical Equipment  (From admission, onward)           Start     Ordered   12/17/23 1153  For home use only DME Negative pressure wound device  Once       Question Answer Comment  Frequency of dressing change 2 times per week   Length of need 3 Months   Dressing type Foam   Amount of suction 125 mm/Hg   Pressure application Continuous pressure   Supplies 10 canisters and 15 dressings per month for duration of therapy      12/17/23 1154              Discharge Care Instructions  (From admission, onward)           Start     Ordered   12/28/23 0000  Discharge wound care:        12/28/23 1332             The results of significant diagnostics from this hospitalization (including imaging, microbiology, ancillary and laboratory) are listed below for reference.    Procedures and Diagnostic Studies:   DG CHEST PORT 1 VIEW Result Date: 12/14/2023 CLINICAL DATA:  201257 ESRD (end stage renal disease) [facility]) [id number] Bilateral lower extremity edema [id number] EXAM: PORTABLE CHEST - 1 VIEW COMPARISON:  December 06, 2023 FINDINGS: Biapical pleural thickening. No focal airspace consolidation, pleural effusion, or pneumothorax. Mild cardiomegaly. Aortic atherosclerosis. No acute fracture or destructive lesions. Multilevel thoracic osteophytosis. IMPRESSION: No acute cardiopulmonary abnormality. Electronically Signed   By: Rogelia Myers M.D.   On: 12/14/2023 16:34   CT ABDOMEN PELVIS WO CONTRAST Addendum Date: 12/13/2023 ADDENDUM REPORT: 12/13/2023 14:05 ADDENDUM: The original report was by Dr. Ryan Salvage. The following addendum is by Dr. Ryan Salvage: Critical Value/emergent results were called by telephone at the time of interpretation on 12/13/2023 at 2:05 pm to provider [facility] , who verbally acknowledged these results. Electronically Signed   By: Ryan Salvage M.D.   On: 12/13/2023 14:05   Result Date: 12/13/2023 CLINICAL DATA:  Invasive breast cancer. Periumbilical hernia with discoloration and drainage. Abdominal pain. End-stage renal disease on hemodialysis. * Tracking Code: BO * EXAM: CT ABDOMEN AND PELVIS WITHOUT CONTRAST TECHNIQUE: Multidetector CT imaging of the abdomen and pelvis was performed following the standard protocol without IV contrast. RADIATION DOSE REDUCTION: This exam was performed according to the departmental dose-optimization program which includes automated exposure control, adjustment of the mA and/or kV according to patient size and/or use of iterative reconstruction technique. COMPARISON:  11/19/2023 FINDINGS: Lower chest: Mild cardiomegaly. Coronary and aortic atherosclerosis. Hepatobiliary: Dependent density in the gallbladder favoring small gallstones. Otherwise unremarkable. Pancreas: Unremarkable Spleen: Unremarkable Adrenals/Urinary Tract: Stable appearance of multiple renal cysts of varying complexity. Atrophic right kidney. Stable mild nodularity of the left adrenal gland, internal density 28  Hounsfield units which is nonspecific. Stomach/Bowel: The left paracentral umbilical hernia contains a knuckle of transverse colon or a Richter hernia with surrounding abnormal extraluminal gas in the hernia favoring perforated viscus. The extraluminal gas is all contained to the hernia sac which measures about 5.6 by 4.4 cm on image 47 series 2. No current bowel dilatation although there  is prominence of stool throughout the colon. Sigmoid colon diverticulosis. Postoperative findings in the right colon. Vascular/Lymphatic: Atherosclerosis is present, including aortoiliac atherosclerotic disease. Atheromatous plaque at the origin of the celiac trunk and SMA. Enlarged precaval node 1.2 cm in short axis on image 35 series 2, formerly 1.3 cm. Reproductive: Uterus absent. Other: No supplemental non-categorized findings. Musculoskeletal: In addition to the left paraumbilical hernia, there is a left Spigelian or lateral abdominal wall hernia the level of the umbilicus containing adipose tissue shown on image 88 series 5. There is also laxity and potentially mild focal hernia of the right lateral abdominal wall musculature containing part of the terminal ileum on image 50 series 2. Mild grade 1 degenerative anterolisthesis at L4-5. IMPRESSION: 1. The left paracentral umbilical hernia contains a knuckle of transverse colon or a Richter hernia with surrounding abnormal extraluminal gas in the hernia sac indicating perforated viscus. The extraluminal gas is all contained to the hernia sac which measures about 5.6 by 4.4 cm. 2. There is also a left Spigelian or lateral abdominal wall hernia the level of the umbilicus containing adipose tissue. There is also laxity and potentially mild focal hernia of the right lateral abdominal wall musculature containing part of the terminal ileum. 3. Stable appearance of multiple renal cysts of varying complexity. Atrophic right kidney. 4. Stable mild nodularity of the left adrenal gland, internal density 28 Hounsfield units which is nonspecific. 5. Cholelithiasis. 6. Sigmoid colon diverticulosis. 7. Mild grade 1 degenerative anterolisthesis at L4-5. 8.  Aortic Atherosclerosis (ICD10-I70.0). Radiology assistant personnel have been notified to put me in telephone contact with the referring physician or the referring physician's clinical representative in order to discuss these  findings. Once this communication is established I will issue an addendum to this report for documentation purposes. Electronically Signed: By: Ryan Salvage M.D. On: 12/13/2023 13:59     Labs:   Basic Metabolic Panel: Recent Labs  Lab 12/22/23 0310 12/23/23 0757 12/24/23 0245 12/27/23 0444  NA 138 140 141 140  K 3.6 3.8 4.5 4.6  CL 102 100 104 99  CO2 24 26 22 24   GLUCOSE 122* 153* 93 93  BUN 45* 28* 42* 54*  CREATININE 5.74* 4.44* 5.75* 6.56*  CALCIUM  9.0 9.4 9.3 9.8  MG 1.8  --   --   --    GFR Estimated Creatinine Clearance: 6.6 mL/min (A) (by C-G formula based on SCr of 6.56 mg/dL (H)). Liver Function Tests: No results for input(s): AST, ALT, ALKPHOS, BILITOT, PROT, ALBUMIN  in the last 168 hours. No results for input(s): LIPASE, AMYLASE in the last 168 hours. No results for input(s): AMMONIA in the last 168 hours. Coagulation profile No results for input(s): INR, PROTIME in the last 168 hours.  CBC: Recent Labs  Lab 12/22/23 0310 12/23/23 0757 12/27/23 0444  WBC 9.8 10.7* 8.1  NEUTROABS  --  7.8* 5.3  HGB 8.0* 8.7* 8.5*  HCT 24.8* 26.7* 26.8*  MCV 96.1 95.4 97.8  PLT 359 316 319   Cardiac Enzymes: No results for input(s): CKTOTAL, CKMB, CKMBINDEX, TROPONINI in the last 168 hours. BNP: Invalid input(s): POCBNP CBG: No results for  input(s): GLUCAP in the last 168 hours. D-Dimer No results for input(s): DDIMER in the last 72 hours. Hgb A1c No results for input(s): HGBA1C in the last 72 hours. Lipid Profile No results for input(s): CHOL, HDL, LDLCALC, TRIG, CHOLHDL, LDLDIRECT in the last 72 hours. Thyroid  function studies No results for input(s): TSH, T4TOTAL, T3FREE, THYROIDAB in the last 72 hours.  Invalid input(s): FREET3 Anemia work up No results for input(s): VITAMINB12, FOLATE, FERRITIN, TIBC, IRON, RETICCTPCT in the last 72 hours. Microbiology No results found for this  or any previous visit (from the past 240 hours).  Time coordinating discharge: 45 minutes  Signed: Rashia Mckesson  [facility] 12/28/2023, 1:33 PM

## 2023-12-28 NOTE — Progress Notes (Signed)
 [facility] Progress Note   Subjective: Seen in room. Completed dialysis yesterday with no issues. No complaints today and hoping to go home with home health today.   Objective Vitals:   12/27/23 1538 12/27/23 2025 12/28/23 0446 12/28/23 0847  BP: (!) 119/54 125/69 (!) 134/53 (!) 133/55  Pulse: 97 100 89 99  Resp: 17  18 18   Temp: 98.3 F (36.8 C) 98.1 F (36.7 C) 98.3 F (36.8 C) 98 F (36.7 C)  TempSrc: Oral Oral Oral Oral  SpO2: 95% 95% 98% 95%  Weight:      Height:        Additional Objective Labs: Basic Metabolic Panel: Recent Labs  Lab 12/23/23 0757 12/24/23 0245 12/27/23 0444  NA 140 141 140  K 3.8 4.5 4.6  CL 100 104 99  CO2 26 22 24   GLUCOSE 153* 93 93  BUN 28* 42* 54*  CREATININE 4.44* 5.75* 6.56*  CALCIUM  9.4 9.3 9.8   CBC: Recent Labs  Lab 12/22/23 0310 12/23/23 0757 12/27/23 0444  WBC 9.8 10.7* 8.1  NEUTROABS  --  7.8* 5.3  HGB 8.0* 8.7* 8.5*  HCT 24.8* 26.7* 26.8*  MCV 96.1 95.4 97.8  PLT 359 316 319   Blood Culture    Component Value Date/Time   SDES  12/13/2023 1411    BLOOD LEFT ANTECUBITAL Performed at [facility], [address] [place], [place] [address]    [facility]  12/13/2023 1411    BOTTLES DRAWN AEROBIC AND ANAEROBIC Blood Culture adequate volume Performed at [facility], [address] [place], [place] [address]    CULT  12/13/2023 1411    NO GROWTH 5 DAYS Performed at [facility], [address], [place], [place] [address]    REPTSTATUS 12/18/2023 FINAL 12/13/2023 1411     Physical Exam General: Alert, well appearing  Heart: RRR Resp: Normal wob Abdomen: non-tender Extremities: No LE edema  Dialysis Access: R AVF   Medications:   acetaminophen   1,000 mg Oral Q6H   atorvastatin   80 mg Oral q AM   Chlorhexidine  Gluconate Cloth  6 each Topical Daily   Chlorhexidine  Gluconate Cloth  6 each Topical Q0600   darbepoetin (ARANESP ) injection -  DIALYSIS  60 mcg Subcutaneous Q Sat-1800   febuxostat   40 mg Oral q AM   feeding supplement (NEPRO CARB STEADY)  237 mL Oral BID BM   heparin  injection (subcutaneous)  5,000 Units Subcutaneous Q8H   metoprolol  tartrate  12.5 mg Oral BID   pantoprazole   40 mg Oral Daily   rOPINIRole   0.5 mg Oral BID   sevelamer  carbonate  1,600 mg Oral TID WC    Dialysis Orders:  [facility] TTS 3h   B350   68.7kg   2K bath  AVF   Heparin  none Last hd 9/02 post wt 69.9kg  Mircera 30 mcg q 4, last 8/26  Assessment/Plan: Perforated bowel due to incarcerated hernia: S/p partial colectomy w/ colostomy creation and incisional hernia repair on 9/4. Pip/tazo completed 9/9. Per pmd/ surgery. Wound vac in place ESRD:  HD TTS. Continue per schedule  BP: bp's acceptable, follow.  AFib. Metoprolol  12.5 bid. AC on hold due h/o SAH Volume: wt's are off, UF 2- 3 L as tolerated Anemia of esrd: Hb 8- 10 here. Follow, transfuse prn.  Aranesp  60 mcg sq weekly was started due to Hb in the 8s consistently.  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 No VDRA. Continue binders. Dispo:  Pending     Rachel Ronnald Acosta PA-C [facility] 12/28/2023,11:59 AM

## 2023-12-29 ENCOUNTER — Telehealth: Payer: Self-pay | Admitting: Nephrology

## 2023-12-29 DIAGNOSIS — D631 Anemia in chronic kidney disease: Secondary | ICD-10-CM | POA: Diagnosis not present

## 2023-12-29 DIAGNOSIS — R52 Pain, unspecified: Secondary | ICD-10-CM | POA: Diagnosis not present

## 2023-12-29 DIAGNOSIS — N2581 Secondary hyperparathyroidism of renal origin: Secondary | ICD-10-CM | POA: Diagnosis not present

## 2023-12-29 DIAGNOSIS — Z992 Dependence on renal dialysis: Secondary | ICD-10-CM | POA: Diagnosis not present

## 2023-12-29 DIAGNOSIS — N186 End stage renal disease: Secondary | ICD-10-CM | POA: Diagnosis not present

## 2023-12-29 NOTE — Telephone Encounter (Signed)
 Transition of Care - Initial Contact after Hospitalization  Date of discharge:  12/28/23 Date of contact: 12/29/23  Method: Phone Spoke to: Patient  Patient contacted to discuss transition of care from recent inpatient hospitalization. Patient was admitted to [facility] from 9/4-9/19/25 with discharge diagnosis of perforated bowel secondary to incarcerated hernia.   The discharge medication list was reviewed.   Patient will return to his/her outpatient HD unit on: Tuesday 9/23  No other concerns at this time.

## 2023-12-31 ENCOUNTER — Ambulatory Visit: Admitting: Physical Therapy

## 2024-01-01 DIAGNOSIS — N186 End stage renal disease: Secondary | ICD-10-CM | POA: Diagnosis not present

## 2024-01-01 DIAGNOSIS — Z992 Dependence on renal dialysis: Secondary | ICD-10-CM | POA: Diagnosis not present

## 2024-01-01 DIAGNOSIS — N2581 Secondary hyperparathyroidism of renal origin: Secondary | ICD-10-CM | POA: Diagnosis not present

## 2024-01-01 DIAGNOSIS — D631 Anemia in chronic kidney disease: Secondary | ICD-10-CM | POA: Diagnosis not present

## 2024-01-01 DIAGNOSIS — R52 Pain, unspecified: Secondary | ICD-10-CM | POA: Diagnosis not present

## 2024-01-03 DIAGNOSIS — Z992 Dependence on renal dialysis: Secondary | ICD-10-CM | POA: Diagnosis not present

## 2024-01-03 DIAGNOSIS — D631 Anemia in chronic kidney disease: Secondary | ICD-10-CM | POA: Diagnosis not present

## 2024-01-03 DIAGNOSIS — R52 Pain, unspecified: Secondary | ICD-10-CM | POA: Diagnosis not present

## 2024-01-03 DIAGNOSIS — N186 End stage renal disease: Secondary | ICD-10-CM | POA: Diagnosis not present

## 2024-01-03 DIAGNOSIS — N2581 Secondary hyperparathyroidism of renal origin: Secondary | ICD-10-CM | POA: Diagnosis not present

## 2024-01-05 DIAGNOSIS — R52 Pain, unspecified: Secondary | ICD-10-CM | POA: Diagnosis not present

## 2024-01-05 DIAGNOSIS — D631 Anemia in chronic kidney disease: Secondary | ICD-10-CM | POA: Diagnosis not present

## 2024-01-05 DIAGNOSIS — Z992 Dependence on renal dialysis: Secondary | ICD-10-CM | POA: Diagnosis not present

## 2024-01-05 DIAGNOSIS — N186 End stage renal disease: Secondary | ICD-10-CM | POA: Diagnosis not present

## 2024-01-05 DIAGNOSIS — N2581 Secondary hyperparathyroidism of renal origin: Secondary | ICD-10-CM | POA: Diagnosis not present

## 2024-01-08 DIAGNOSIS — I10 Essential (primary) hypertension: Secondary | ICD-10-CM | POA: Diagnosis not present

## 2024-01-08 DIAGNOSIS — Z992 Dependence on renal dialysis: Secondary | ICD-10-CM | POA: Diagnosis not present

## 2024-01-08 DIAGNOSIS — I2510 Atherosclerotic heart disease of native coronary artery without angina pectoris: Secondary | ICD-10-CM | POA: Diagnosis not present

## 2024-01-08 DIAGNOSIS — N2581 Secondary hyperparathyroidism of renal origin: Secondary | ICD-10-CM | POA: Diagnosis not present

## 2024-01-08 DIAGNOSIS — N186 End stage renal disease: Secondary | ICD-10-CM | POA: Diagnosis not present

## 2024-01-08 DIAGNOSIS — D631 Anemia in chronic kidney disease: Secondary | ICD-10-CM | POA: Diagnosis not present

## 2024-01-08 DIAGNOSIS — R52 Pain, unspecified: Secondary | ICD-10-CM | POA: Diagnosis not present

## 2024-01-08 DIAGNOSIS — I120 Hypertensive chronic kidney disease with stage 5 chronic kidney disease or end stage renal disease: Secondary | ICD-10-CM | POA: Diagnosis not present

## 2024-01-16 DIAGNOSIS — I609 Nontraumatic subarachnoid hemorrhage, unspecified: Secondary | ICD-10-CM | POA: Diagnosis not present

## 2024-01-28 DIAGNOSIS — Z961 Presence of intraocular lens: Secondary | ICD-10-CM | POA: Diagnosis not present

## 2024-01-28 DIAGNOSIS — H5211 Myopia, right eye: Secondary | ICD-10-CM | POA: Diagnosis not present

## 2024-01-28 DIAGNOSIS — H401131 Primary open-angle glaucoma, bilateral, mild stage: Secondary | ICD-10-CM | POA: Diagnosis not present

## 2024-01-28 DIAGNOSIS — H353132 Nonexudative age-related macular degeneration, bilateral, intermediate dry stage: Secondary | ICD-10-CM | POA: Diagnosis not present

## 2024-02-13 ENCOUNTER — Other Ambulatory Visit: Payer: Self-pay

## 2024-02-14 MED ORDER — ATORVASTATIN CALCIUM 80 MG PO TABS: 80.0000 mg | ORAL_TABLET | Freq: Every morning | ORAL | 1 refills | Status: DC

## 2024-02-22 NOTE — Progress Notes (Unsigned)
 Cardiology Office Note    Date:  02/25/2024  ID:  ANALYSE ANGST, DOB 1946/03/09, MRN [id number] PCP:  Ransom Other, MD  Cardiologist:  Wyn Raddle, Jackee Shove, NP To establish with Dr. Floretta  Electrophysiologist:  None   Chief Complaint: Follow up for PAF   History of Present Illness: .    Rachel Villarreal is a 78 y.o. female with visit-pertinent history of ESRD on IHD, hypertension, hyperlipidemia, PAF, CAD s/p NSTEMI in 04/2023, prior breast cancer, gout and GERD.  Ms. Bradby was admitted in 04/2023 with complaint of respiratory infection symptoms with cough and shortness of breath.  Initially presented to be hypoxic and started on 2 L nasal cannula with EKG showing sinus rhythm with PVCs and subsequently converting to atrial flutter with RVR.  Patient's troponin increased at 5456>>4458.  Echocardiogram indicated LVEF 35 to 40% with mild concentric LVH, reduced RV function with moderately large and RV and pleural effusions.  Patient underwent right/left heart catheterization indicating proximal LAD to mid LAD lesion 30% stenosed, mid Cx lesion 85% stenosed, it was noted that as patient had no active angina LCx should be treated medically given concerns for higher bleeding risk and need for DOAC for A-fib.  It was noted that if she had refractory angina on medical therapy then LCx PCI could be considered. Patient was started on anticoagulation following her cath and transition to p.o. amiodarone  for rate control.  In April 2025 patient wore cardiac monitor to evaluate for recurrence of atrial fibrillation, this indicated an average heart rate of 74 bpm ranging from 55 211 bpm, predominant underlying rhythm was sinus rhythm, no episodes of atrial fibrillation or flutter.  Echocardiogram on 10/19/2023 indicated LVEF 50 to 55%, no RWMA, diastolic parameters were indeterminate, elevated left ventricular end-diastolic pressures, RV systolic function and size was normal, mildly elevated PASP, LA was  moderately dilated, trivial mitral valve regurgitation with no evidence of stenosis, aortic valve regurgitation was not visualized, no stenosis was present.  In 12/08/23 patient was admitted after a mechanical fall at home, found to have a subarachnoid hemorrhage with CT of the head showing a 5 mm curvilinear hyperdensity along the left sylvian fissure.  Patient's Eliquis  was held. She was seen by Dr. Darnella on January 16, 2024 with [facility], noted that her small traumatic subarachnoid hemorrhage had resolved on repeat imaging, no further imaging or follow-up was needed and she was approved to resume Eliquis .  On chart review patient was admitted in 12/2023, underwent exploratory laparotomy, partial colectomy, ventral incisional hernia repair over Vicryl mesh and abdominal wall debridement on 9//2025 for an incarcerated ventral hernia with colon perforation.  Today she presents for follow-up.  She reports that she has been doing well overall. She denies chest pain, shortness of breath, lower extremity edema, orthopnea or PND.  She denies any palpitations, presyncope or syncope.  Patient overall denies any cardiac concerns or complaints today, notes that she was due for follow-up and wanted to review her recent hospitalizations.  She reports that her PCP restarted her on Eliquis  5 mg twice daily after she saw Dr. Darnella, reports she has been tolerating well without any bleeding difficulties.  Her hemoglobin and hematocrit are monitored at dialysis, on review of notes her hemoglobin has been stable at 10.  Patient lives independently at home, has stairs that she regularly has to climb and tolerates very well.  Patient notes history of hypotension after dialysis, she notes significant improvement with decrease in  metoprolol  to 25 mg daily.  Labwork independently reviewed: 02/15/2024: Uric acid 2.4, TSH 3.06, AST 21, ALT 19 ROS: .   Today she denies chest pain, shortness of breath, lower  extremity edema, fatigue, palpitations, melena, hematuria, hemoptysis, diaphoresis, weakness, presyncope, syncope, orthopnea, and PND.  All other systems are reviewed and otherwise negative. Studies Reviewed: [name]   EKG:  EKG is not ordered today.  CV Studies: Cardiac studies reviewed are outlined and summarized above. Otherwise please see EMR for full report. Cardiac Studies & Procedures   ______________________________________________________________________________________________ CARDIAC CATHETERIZATION  CARDIAC CATHETERIZATION 04/23/2023  Conclusion   Prox LAD to Mid LAD lesion is 30% stenosed.   Mid Cx lesion is 85% stenosed.   Prox RCA to Dist RCA lesion is 20% stenosed.   LV end diastolic pressure is normal.  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 LVEDP 9 mm Hg, PCWP 6/4 mean 5 mm Hg Normal right heart pressures. PAP 30/7 mean 17 mm Hg Normal cardiac output 5.11 L/min, index 3.06  Plan: patient has no active angina. While the LCx could be treated with PCI I think the appropriate thing to do is treat her medically. She is at higher bleeding risk and needs DOAC for Afib. If she has refractory angina on medical therapy then LCx PCI could be considered.  Findings Coronary Findings Diagnostic  Dominance: Right  Left Main Vessel was injected. Vessel is normal in caliber. Vessel is angiographically normal.  Left Anterior Descending Prox LAD to Mid LAD lesion is 30% stenosed.  Left Circumflex Mid Cx lesion is 85% stenosed.  First Obtuse Marginal Branch Vessel is small in size.  Right Coronary Artery Prox RCA to Dist RCA lesion is 20% stenosed.  Intervention  No interventions have been documented.     ECHOCARDIOGRAM  ECHOCARDIOGRAM COMPLETE 10/19/2023  Narrative ECHOCARDIOGRAM REPORT    Patient Name:   Rachel Villarreal Date of Exam: 10/19/2023 Medical Rec #:  [id number]       Height:       62.0 in Accession #:    [id number]       Weight:       155.6 lb Date of Birth:  Mar 28, 1946       BSA:          1.718 m Patient Age:    77 years        BP:           132/70 mmHg Patient Gender: F               HR:           89 bpm. Exam Location:  [address]  Procedure: 2D Echo (Both Spectral and Color Flow Doppler were utilized during procedure).  Indications:    I50.20* Unspecified systolic (congestive) heart failure  History:        Patient has prior history of Echocardiogram examinations, most recent 04/17/2023. CAD and Previous Myocardial Infarction, Arrythmias:Atrial Fibrillation; Risk Factors:Hypertension and Dyslipidemia. ESRD. Asthma. Left breast cancer.  Sonographer:    Jon Hacker RCS Referring Phys: [phone] JACKEE DEL, JR DICK  IMPRESSIONS   1. Left ventricular ejection fraction, by estimation, is 50 to 55%. The left ventricle has low normal function. The left ventricle has no regional wall motion abnormalities. Left ventricular diastolic parameters are indeterminate. Elevated left ventricular end-diastolic pressure. 2. Right ventricular systolic function is normal. The right ventricular size is normal. There is mildly elevated pulmonary artery systolic pressure. 3. Left atrial size  was moderately dilated. 4. The mitral valve is normal in structure. Trivial mitral valve regurgitation. No evidence of mitral stenosis. 5. The aortic valve is tricuspid. Aortic valve regurgitation is not visualized. No aortic stenosis is present. 6. The inferior vena cava is normal in size with greater than 50% respiratory variability, suggesting right atrial pressure of 3 mmHg.  Comparison(s): Changes from prior study are noted. The left ventricular function has improved.  FINDINGS Left Ventricle: Left ventricular ejection fraction, by estimation, is 50 to 55%. The left ventricle has low normal function. The left ventricle has no regional wall motion abnormalities. The left ventricular internal cavity size was normal in size. There  is no left ventricular hypertrophy. Left ventricular diastolic parameters are indeterminate. Elevated left ventricular end-diastolic pressure.  Right Ventricle: The right ventricular size is normal. No increase in right ventricular wall thickness. Right ventricular systolic function is normal. There is mildly elevated pulmonary artery systolic pressure. The tricuspid regurgitant velocity is 2.95 m/s, and with an assumed right atrial pressure of 3 mmHg, the estimated right ventricular systolic pressure is 37.8 mmHg.  Left Atrium: Left atrial size was moderately dilated.  Right Atrium: Right atrial size was normal in size.  Pericardium: There is no evidence of pericardial effusion.  Mitral Valve: The mitral valve is normal in structure. Mild mitral annular calcification. Trivial mitral valve regurgitation. No evidence of mitral valve stenosis.  Tricuspid Valve: The tricuspid valve is normal in structure. Tricuspid valve regurgitation is mild . No evidence of tricuspid stenosis.  Aortic Valve: The aortic valve is tricuspid. Aortic valve regurgitation is not visualized. No aortic stenosis is present.  Pulmonic Valve: The pulmonic valve was normal in structure. Pulmonic valve regurgitation is mild. No evidence of pulmonic stenosis.  Aorta: The aortic root is normal in size and structure.  Venous: The inferior vena cava is normal in size with greater than 50% respiratory variability, suggesting right atrial pressure of 3 mmHg.  IAS/Shunts: No atrial level shunt detected by color flow Doppler.   LEFT VENTRICLE PLAX 2D LVIDd:         5.02 cm   Diastology LVIDs:         3.34 cm   LV e' medial:    7.29 cm/s LV PW:         1.13 cm   LV E/e' medial:  18.8 LV IVS:        1.07 cm   LV e' lateral:   12.00 cm/s LVOT diam:     1.90 cm   LV E/e' lateral: 11.4 LV SV:         70 LV SV Index:   41 LVOT Area:     2.84 cm   RIGHT VENTRICLE RV Basal diam:  3.06 cm RV S prime:     8.81 cm/s TAPSE  (M-mode): 1.3 cm RVSP:           37.8 mmHg  LEFT ATRIUM             Index        RIGHT ATRIUM           Index LA diam:        4.90 cm 2.85 cm/m   RA Pressure: 3.00 mmHg LA Vol (A2C):   61.6 ml 35.85 ml/m  RA Area:     8.74 cm LA Vol (A4C):   52.1 ml 30.32 ml/m  RA Volume:   12.90 ml  7.51 ml/m LA Biplane Vol: 60.1 ml 34.98 ml/m AORTIC  VALVE LVOT Vmax:   119.00 cm/s LVOT Vmean:  79.700 cm/s LVOT VTI:    0.246 m  AORTA Ao Root diam: 2.60 cm Ao Asc diam:  3.80 cm  MITRAL VALVE                TRICUSPID VALVE MV Area (PHT): 4.52 cm     TR Peak grad:   34.8 mmHg MV Decel Time: 168 msec     TR Vmax:        295.00 cm/s MV E velocity: 137.00 cm/s  Estimated RAP:  3.00 mmHg MV A velocity: 135.00 cm/s  RVSP:           37.8 mmHg MV E/A ratio:  1.01 SHUNTS Systemic VTI:  0.25 m Systemic Diam: 1.90 cm  Annabella Scarce MD Electronically signed by Annabella Scarce MD Signature Date/Time: 10/19/2023/4:33:57 PM    Final    MONITORS  CARDIAC EVENT MONITOR 07/17/2023  Narrative   Patient had a minimum heart rate of 55 bpm, maximum heart rate of 111 bpm, and average heart rate of 74 bpm. Predominant underlying rhythm was sinus rhythm. Isolated PACs were occasional (1.0%). Isolated PVCs were occasional (1.0%). No triggered and diary events associated.  No episodes of atrial fibrillation or flutter.       ______________________________________________________________________________________________       Current Reported Medications:.    Current Meds  Medication Sig   acetaminophen  (TYLENOL ) 500 MG tablet Take 500 mg by mouth every 6 (six) hours as needed for mild pain (pain score 1-3).   anastrozole  (ARIMIDEX ) 1 MG tablet TAKE 1 TABLET BY MOUTH EVERY DAY   apixaban  (ELIQUIS ) 2.5 MG TABS tablet Take 1 tablet (2.5 mg total) by mouth 2 (two) times daily.   atorvastatin  (LIPITOR ) 80 MG tablet Take 1 tablet (80 mg total) by mouth in the morning.   budesonide  (RHINOCORT  AQUA)  32 MCG/ACT nasal spray Place 2 sprays into both nostrils in the morning.   Cholecalciferol 50 MCG (2000 UT) CAPS Take 2,000 Units by mouth in the morning.   diclofenac  Sodium (VOLTAREN ) 1 % GEL Apply 1 Application topically 4 (four) times daily as needed (pain.).   docusate sodium  (COLACE) 100 MG capsule Take 100 mg by mouth daily as needed for mild constipation.   eszopiclone (LUNESTA) 2 MG TABS tablet Take 2 mg by mouth at bedtime.   febuxostat  (ULORIC ) 40 MG tablet Take 40 mg by mouth in the morning.   loperamide  (IMODIUM  A-D) 2 MG capsule Take 1 capsule (2 mg total) by mouth 3 (three) times daily as needed for diarrhea or loose stools.   loratadine  (CLARITIN ) 10 MG tablet Take 10 mg by mouth 2 (two) times daily.   Methoxy PEG-Epoetin  Beta (MIRCERA IJ) 60 mcg.   metoprolol  succinate (TOPROL  XL) 25 MG 24 hr tablet Take 1 tablet (25 mg total) by mouth daily.   Multiple Vitamins-Minerals (PRESERVISION AREDS 2+MULTI VIT PO) Take 1 capsule by mouth 2 (two) times daily.   Nutritional Supplements (FEEDING SUPPLEMENT, NEPRO CARB STEADY,) LIQD Take 237 mLs by mouth 2 (two) times daily between meals.   ondansetron  (ZOFRAN ) 4 MG tablet Take 4 mg by mouth See admin instructions. Take 1 tablet by mouth on dialysis days, during dialysis   pantoprazole  (PROTONIX ) 40 MG tablet Take 1 tablet (40 mg total) by mouth daily.   PROAIR  HFA 108 (90 BASE) MCG/ACT inhaler Inhale 1-2 puffs into the lungs every 6 (six) hours as needed for shortness of breath or wheezing (for respiratory  issues.).   rOPINIRole  (REQUIP ) 0.5 MG tablet Take 1 tablet (0.5 mg total) by mouth every 12 (twelve) hours as needed (Restless legs). (Patient taking differently: Take 0.5 mg by mouth in the morning and at bedtime.)   sevelamer  carbonate (RENVELA ) 800 MG tablet Take 1,600 mg by mouth 3 (three) times daily with meals.   Physical Exam:    VS:  BP 126/70   Pulse 82   Ht 5' 2 (1.575 m)   Wt 150 lb (68 kg)   SpO2 97%   BMI 27.44 kg/m     Wt Readings from Last 3 Encounters:  02/25/24 150 lb (68 kg)  12/27/23 167 lb 1.7 oz (75.8 kg)  12/07/23 149 lb 14.6 oz (68 kg)    GEN: Well nourished, well developed in no acute distress NECK: No JVD; No carotid bruits CARDIAC: RRR, no murmurs, rubs, gallops RESPIRATORY:  Clear to auscultation without rales, wheezing or rhonchi  ABDOMEN: Soft, non-tender, non-distended EXTREMITIES:  No edema; No acute deformity     Asessement and Plan:.    CAD: Patient with NSTEMI in 04/2023. Patient underwent right/left heart catheterization indicating proximal LAD to mid LAD lesion 30% stenosed, mid Cx lesion 85% stenosed, it was noted that as patient had no active angina LCx should be treated medically given concerns for higher bleeding risk and need for DOAC for A-fib.  It was noted that if she had refractory angina on medical therapy then LCx PCI could be considered. Stable with no anginal symptoms. No indication for ischemic evaluation.  Heart healthy diet and regular cardiovascular exercise encouraged.  Continue Lipitor  80 mg daily, metoprolol  succinate 25 mg daily.  Persistent atrial fibrillation: Patient with history of atrial fibrillation, started on amiodarone  in 04/2023.  Cardiac monitor in 4/25 showed no evidence of atrial fibrillation, amiodarone  previously discontinued. Today she reports that she is doing well, denies any feelings of atrial fibrillation or irregular heartbeat.  Patient's Eliquis  was on hold in August after she had a small subarachnoid hemorrhage after a mechanical fall at home.  She was seen by neurosurgery on October 8 and cleared to resume Eliquis , was started on 5 mg twice daily by her PCP.  Given her age and renal function we will decrease her dose to 2.5 mg twice daily.  Patient in agreement with plan.  On review of notes from dialysis her hemoglobin has been stable, she notes that they closely monitor her lab work.  Patient was previously on metoprolol  succinate 50 mg daily  however had problems with hypotension after dialysis, notes significant improvement on metoprolol  succinate 50 mg daily.  She denies any bleeding problems on Eliquis .  Start Eliquis  2.5 mg twice daily.  HFrEF: LVEF 35 to 40% at time of NSTEMI in 04/2023.  Repeat echo on 10/19/2023 indicated LVEF 50 to 55%.  GDMT previously limited in setting of hypotension. Today she denies any increased lower extremity edema, shortness of breath, orthopnea or PND.  She appears euvolemic and well compensated on exam.  Continue metoprolol  succinate 25 mg daily.  HTN: Blood pressure today 126/70.  Patient with history of orthostasis after dialysis.  Continue metoprolol  succinate 25 mg daily.  HLD: Last lipid profile on 02/15/2024 indicated total cholesterol 173, triglycerides 115, HDL 60 and LDL 92.  Discussed with patient that given evidence of CAD LDL goal should be less than 70.  She is currently on atorvastatin  80 mg daily, discussed starting Zetia 10 mg daily however she deferred for now given ongoing issues with  her ostomy and loose stools with dehydration, is hoping to undergo reversal at the start of the year.  She is open to considering if she has improvement.  ESRD: Patient on HD Tuesday, Thursday and Saturday.  Reports she has been tolerating well, followed by nephrology.   Disposition: F/u with Dr. Floretta in two months or sooner if needed.   Signed, Donelda Mailhot D Author [place], NP

## 2024-02-25 ENCOUNTER — Encounter: Payer: Self-pay | Admitting: Cardiology

## 2024-02-25 ENCOUNTER — Ambulatory Visit: Attending: Cardiology | Admitting: Cardiology

## 2024-02-25 VITALS — BP 126/70 | HR 82 | Ht 62.0 in | Wt 150.0 lb

## 2024-02-25 DIAGNOSIS — I5020 Unspecified systolic (congestive) heart failure: Secondary | ICD-10-CM | POA: Diagnosis not present

## 2024-02-25 DIAGNOSIS — I2510 Atherosclerotic heart disease of native coronary artery without angina pectoris: Secondary | ICD-10-CM

## 2024-02-25 DIAGNOSIS — I480 Paroxysmal atrial fibrillation: Secondary | ICD-10-CM | POA: Diagnosis not present

## 2024-02-25 DIAGNOSIS — I10 Essential (primary) hypertension: Secondary | ICD-10-CM | POA: Diagnosis not present

## 2024-02-25 DIAGNOSIS — E785 Hyperlipidemia, unspecified: Secondary | ICD-10-CM

## 2024-02-25 DIAGNOSIS — Z992 Dependence on renal dialysis: Secondary | ICD-10-CM

## 2024-02-25 MED ORDER — APIXABAN 2.5 MG PO TABS: 2.5000 mg | ORAL_TABLET | Freq: Two times a day (BID) | ORAL | 3 refills | Status: AC

## 2024-02-25 NOTE — Patient Instructions (Signed)
 Medication Instructions:  Start: Eliquis  2.5mg  2 times per day *If you need a refill on your cardiac medications before your next appointment, please call your pharmacy*  Lab Work: None  If you have labs (blood work) drawn today and your tests are completely normal, you will receive your results only by: MyChart Message (if you have MyChart) OR A paper copy in the mail If you have any lab test that is abnormal or we need to change your treatment, we will call you to review the results.  Testing/Procedures: None   Follow-Up: At [facility], you and your health needs are our priority.  As part of our continuing mission to provide you with exceptional heart care, our providers are all part of one team.  This team includes your primary Cardiologist (physician) and Advanced Practice Providers or APPs (Physician Assistants and Nurse Practitioners) who all work together to provide you with the care you need, when you need it.  Your next appointment:   2 month(s)  Provider:   Dr. Georganna Archer   Other Instructions None

## 2024-03-28 ENCOUNTER — Emergency Department (HOSPITAL_COMMUNITY)

## 2024-03-28 ENCOUNTER — Emergency Department (HOSPITAL_COMMUNITY)
Admission: EM | Admit: 2024-03-28 | Discharge: 2024-03-28 | Disposition: A | Discharge status: Home / Self Care | Attending: Emergency Medicine | Admitting: Emergency Medicine

## 2024-03-28 ENCOUNTER — Other Ambulatory Visit: Payer: Self-pay

## 2024-03-28 DIAGNOSIS — Z7901 Long term (current) use of anticoagulants: Secondary | ICD-10-CM | POA: Insufficient documentation

## 2024-03-28 DIAGNOSIS — Z992 Dependence on renal dialysis: Secondary | ICD-10-CM | POA: Insufficient documentation

## 2024-03-28 DIAGNOSIS — R42 Dizziness and giddiness: Secondary | ICD-10-CM | POA: Diagnosis not present

## 2024-03-28 DIAGNOSIS — R410 Disorientation, unspecified: Secondary | ICD-10-CM | POA: Diagnosis present

## 2024-03-28 DIAGNOSIS — N186 End stage renal disease: Secondary | ICD-10-CM | POA: Diagnosis not present

## 2024-03-28 DIAGNOSIS — R4189 Other symptoms and signs involving cognitive functions and awareness: Secondary | ICD-10-CM | POA: Diagnosis not present

## 2024-03-28 LAB — BASIC METABOLIC PANEL WITH GFR
Anion gap: 16 — ABNORMAL HIGH (ref 5–15)
BUN: 34 mg/dL — ABNORMAL HIGH (ref 8–23)
CO2: 24 mmol/L (ref 22–32)
Calcium: 10.3 mg/dL (ref 8.9–10.3)
Chloride: 97 mmol/L — ABNORMAL LOW (ref 98–111)
Creatinine, Ser: 5.24 mg/dL — ABNORMAL HIGH (ref 0.44–1.00)
GFR, Estimated: 8 mL/min — ABNORMAL LOW
Glucose, Bld: 92 mg/dL (ref 70–99)
Potassium: 4.3 mmol/L (ref 3.5–5.1)
Sodium: 137 mmol/L (ref 135–145)

## 2024-03-28 LAB — URINALYSIS, ROUTINE W REFLEX MICROSCOPIC
Bilirubin Urine: NEGATIVE
Glucose, UA: NEGATIVE mg/dL
Ketones, ur: NEGATIVE mg/dL
Nitrite: NEGATIVE
Protein, ur: >300 mg/dL — AB
Specific Gravity, Urine: 1.015 (ref 1.005–1.030)
pH: 8 (ref 5.0–8.0)

## 2024-03-28 LAB — RESP PANEL BY RT-PCR (RSV, FLU A&B, COVID)  RVPGX2
Influenza A by PCR: NEGATIVE
Influenza B by PCR: NEGATIVE
Resp Syncytial Virus by PCR: NEGATIVE
SARS Coronavirus 2 by RT PCR: NEGATIVE

## 2024-03-28 LAB — URINALYSIS, MICROSCOPIC (REFLEX)

## 2024-03-28 LAB — CBC
HCT: 33.3 % — ABNORMAL LOW (ref 36.0–46.0)
Hemoglobin: 11.1 g/dL — ABNORMAL LOW (ref 12.0–15.0)
MCH: 32 pg (ref 26.0–34.0)
MCHC: 33.3 g/dL (ref 30.0–36.0)
MCV: 96 fL (ref 80.0–100.0)
Platelets: 214 K/uL (ref 150–400)
RBC: 3.47 MIL/uL — ABNORMAL LOW (ref 3.87–5.11)
RDW: 14.3 % (ref 11.5–15.5)
WBC: 8.3 K/uL (ref 4.0–10.5)
nRBC: 0 % (ref 0.0–0.2)

## 2024-03-28 NOTE — ED Triage Notes (Signed)
 PT BIB [facility] from home d/t mild confusion. EMS reports pt was at lunch with a friend and had some directional confusion, drove straight pass house x2.Per ems pt stopped statin meds d/t statin toxicity   a/ox4 Hx stroke  BP 144/78   94 hr 100% ra 20 rr   148 CBG  Dialysis pt

## 2024-03-28 NOTE — ED Provider Notes (Signed)
 " [place] EMERGENCY DEPARTMENT AT [facility] Provider Note   CSN: [id number] Arrival date & time: 03/28/24  1506     Patient presents with: Mild Confusion.   Rachel Villarreal is a 78 y.o. female presented to ED with complaint of confusion and brain fog.  Patient reports that she drove past and missed a turn to her house a few times today and felt lost and somewhat lightheaded.  She said this was unusual for her.  She lives by herself.  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  She does produce a little bit of urine daily.  She has been struggling with restless leg for the past several weeks and has been on Requip , which tends to help, makes her drowsy in the daytime, and did take it this morning.  She says her PCP thinks that she may have had statin toxicity although she has been on atorvastatin  for many years the dose had been increased recently.  She has now discontinued that medication.   HPI     Prior to Admission medications  Medication Sig Start Date End Date Taking? Authorizing Provider  acetaminophen  (TYLENOL ) 500 MG tablet Take 500 mg by mouth every 6 (six) hours as needed for mild pain (pain score 1-3).    [provider]  anastrozole  (ARIMIDEX ) 1 MG tablet TAKE 1 TABLET BY MOUTH EVERY DAY 11/15/23   Odean Potts, MD  apixaban  (ELIQUIS ) 2.5 MG TABS tablet Take 1 tablet (2.5 mg total) by mouth 2 (two) times daily. 02/25/24   West, Katlyn D, NP  atorvastatin  (LIPITOR ) 80 MG tablet Take 1 tablet (80 mg total) by mouth in the morning. 02/14/24   Santo Stanly LABOR, MD  budesonide  (RHINOCORT  AQUA) 32 MCG/ACT nasal spray Place 2 sprays into both nostrils in the morning. 12/12/22   [provider]  Cholecalciferol 50 MCG (2000 UT) CAPS Take 2,000 Units by mouth in the morning. 12/27/22   [provider]  diclofenac  Sodium (VOLTAREN ) 1 % GEL Apply 1 Application topically 4 (four) times daily as needed (pain.).    [provider]  docusate sodium  (COLACE) 100 MG capsule Take 100 mg by mouth daily as needed for mild constipation.    [provider]  eszopiclone (LUNESTA) 2 MG TABS tablet Take 2 mg by mouth at bedtime. 03/27/23   [provider]  febuxostat  (ULORIC ) 40 MG tablet Take 40 mg by mouth in the morning.    [provider]  loperamide  (IMODIUM  A-D) 2 MG capsule Take 1 capsule (2 mg total) by mouth 3 (three) times daily as needed for diarrhea or loose stools. 12/28/23   Arlice Reichert, MD  loratadine  (CLARITIN ) 10 MG tablet Take 10 mg by mouth 2 (two) times daily. 12/12/22   [provider]  Methoxy PEG-Epoetin  Beta (MIRCERA IJ) 60 mcg. 08/07/23 08/05/24  [provider]  metoprolol  succinate (TOPROL  XL) 25 MG 24 hr tablet Take 1 tablet (25 mg total) by mouth daily. 12/28/23 03/27/24  Arlice Reichert, MD  Multiple Vitamins-Minerals (PRESERVISION AREDS 2+MULTI VIT PO) Take 1 capsule by mouth 2 (two) times daily.    [provider]  Nutritional Supplements (FEEDING SUPPLEMENT, NEPRO CARB STEADY,) LIQD Take 237 mLs by mouth 2 (two) times daily between meals. 12/28/23   Arlice Reichert, MD  ondansetron  (ZOFRAN ) 4 MG tablet Take 4 mg by mouth See admin instructions. Take 1 tablet by mouth on dialysis days, during dialysis 11/13/23  [provider]  pantoprazole  (PROTONIX ) 40 MG tablet Take 1 tablet (40 mg total) by mouth daily. 06/05/23   Santo Stanly LABOR, MD  PROAIR  HFA 108 (90 BASE) MCG/ACT inhaler Inhale 1-2 puffs into the lungs every 6 (six) hours as needed for shortness of breath or wheezing (for respiratory issues.). 12/03/14   [provider]  rOPINIRole  (REQUIP ) 0.5 MG tablet Take 1 tablet (0.5 mg total) by mouth every 12 (twelve) hours as needed (Restless legs). Patient taking differently: Take 0.5 mg by mouth in the morning and at bedtime. 04/25/23   Jillian Buttery, MD  sevelamer  carbonate (RENVELA ) 800 MG tablet Take 1,600 mg by mouth 3  (three) times daily with meals. 03/15/23   [provider]    Allergies: Ace inhibitors, Lactose intolerance (gi), Letrozole , Lisinopril, Mercury, and Tape    Review of Systems  Updated Vital Signs BP (!) 140/66   Pulse 90   Temp 98.4 F (36.9 C) (Oral)   Resp 18   Ht 5' 1 (1.549 m)   Wt 68 kg   SpO2 98%   BMI 28.34 kg/m   Physical Exam Constitutional:      General: She is not in acute distress.    Comments: Restless legs  HENT:     Head: Normocephalic and atraumatic.  Eyes:     Conjunctiva/sclera: Conjunctivae normal.     Pupils: Pupils are equal, round, and reactive to light.  Cardiovascular:     Rate and Rhythm: Normal rate and regular rhythm.  Pulmonary:     Effort: Pulmonary effort is normal. No respiratory distress.  Abdominal:     General: There is no distension.     Tenderness: There is no abdominal tenderness.  Skin:    General: Skin is warm and dry.  Neurological:     General: No focal deficit present.     Mental Status: She is alert. Mental status is at baseline.  Psychiatric:        Mood and Affect: Mood normal.        Behavior: Behavior normal.     (all labs ordered are listed, but only abnormal results are displayed) Labs Reviewed  BASIC METABOLIC PANEL WITH GFR - Abnormal; Notable for the following components:      Result Value   Chloride 97 (*)    BUN 34 (*)    Creatinine, Ser 5.24 (*)    GFR, Estimated 8 (*)    Anion gap 16 (*)    All other components within normal limits  CBC - Abnormal; Notable for the following components:   RBC 3.47 (*)    Hemoglobin 11.1 (*)    HCT 33.3 (*)    All other components within normal limits  URINALYSIS, ROUTINE W REFLEX MICROSCOPIC - Abnormal; Notable for the following components:   Hgb urine dipstick TRACE (*)    Protein, ur >300 (*)    Leukocytes,Ua SMALL (*)    All other components within normal limits  URINALYSIS, MICROSCOPIC (REFLEX) - Abnormal; Notable for the following components:    Bacteria, UA RARE (*)    All other components within normal limits  RESP PANEL BY RT-PCR (RSV, FLU A&B, COVID)  [id number]    EKG: None  Radiology: MR BRAIN WO CONTRAST Result Date: 03/28/2024 EXAM: MRI BRAIN WITHOUT CONTRAST 03/28/2024 06:13:38 PM TECHNIQUE: Multiplanar multisequence MRI of the head/brain was performed without the administration of intravenous contrast. COMPARISON: None available. CLINICAL HISTORY: Mental status change, unknown cause. FINDINGS: BRAIN AND VENTRICLES: No  acute infarct. Foraminal lacunar infarcts are present in the right thalamus and left lentiform nucleus. A remote lacunar infarct is present in the right caudate head. Dilated perivascular spaces are present in the basal ganglia bilaterally. White matter changes extending into the brainstem. Remote encephalomalacia is present in the left cerebellum. No intracranial hemorrhage. No mass. No midline shift. No hydrocephalus. The sella is unremarkable. Normal flow voids. ORBITS: No acute abnormality. SINUSES AND MASTOIDS: Mild mucosal thickening is present in the anterior frontal sinuses bilaterally. An air-fluid level is present in the left maxillary sinus. Left mastoid effusion is present. No obstructing nasopharyngeal lesion is present. BONES AND SOFT TISSUES: Normal marrow signal. No acute soft tissue abnormality. Involutional changes are present. IMPRESSION: 1. No acute intracranial abnormality to explain mental status change. 2. Chronic microvascular ischemic changes with multiple remote lacunar infarcts. 3. Left mastoid effusion and paranasal sinus disease with left maxillary air-fluid level. Electronically signed by: Lonni Necessary MD 03/28/2024 06:55 PM EST RP Workstation: [id number]     Procedures   Medications Ordered in the ED - No data to display                                  Medical Decision Making Amount and/or Complexity of Data Reviewed Labs: ordered. Radiology: ordered.   This patient  presents to the Emergency Department with complaint of altered mental status.  This involves an extensive number of treatment options, and is a complaint that carries with it a high risk of complications and morbidity.  The differential diagnosis includes hypoglycemia vs metabolic encephalopathy vs infection (including cystitis) vs ICH vs stroke vs polypharmacy vs other  I ordered, reviewed, and interpreted labs, including no emergent findings.  Known chronic kidney disease.  UA without clear evidence of infection I ordered imaging studies which included MRI of the brain I independently visualized and interpreted imaging which showed chronic infarcts, no acute finding   Low suspicion for arrhythmia, sepsis, meningitis, PE, other life-threatening cause of the patient's symptoms.  I suspect her symptoms are most likely related to these medication changes, particularly with the Requip  which she has been taking at higher doses to control restless leg syndrome.  These do make her drowsy.  She needs to exercise caution taking his medicines and she understands this.  She does not want to stay in the hospital I do think it is reasonable to discharge her home, and she is calling a friend to take her home.  She does have arrangements to return to dialysis tomorrow and Monday, at which point there will be a provider can reassess her symptoms, which is reassuring.  But overall I do recommend PCP follow-up.  She verbalized understanding     Final diagnoses:  Confusion    ED Discharge Orders     None          Cottie Donnice PARAS, MD 03/28/24 2038  "

## 2024-03-28 NOTE — Discharge Instructions (Addendum)
 Reach out to your doctors office to discuss the symptoms that you are having.  It is possible that you are experiencing side effects of the medications you are on, which can make you drowsy, and can affect your mental status.  You should not drive if you are feeling foggy headed, sleepy or drowsy.

## 2024-04-16 ENCOUNTER — Other Ambulatory Visit (HOSPITAL_BASED_OUTPATIENT_CLINIC_OR_DEPARTMENT_OTHER): Payer: Self-pay

## 2024-04-17 ENCOUNTER — Other Ambulatory Visit (HOSPITAL_BASED_OUTPATIENT_CLINIC_OR_DEPARTMENT_OTHER): Payer: Self-pay

## 2024-04-17 MED ORDER — METOPROLOL SUCCINATE ER 25 MG PO TB24: 25.0000 mg | ORAL_TABLET | Freq: Every day | ORAL | 3 refills | Status: DC

## 2024-04-17 MED ORDER — FEBUXOSTAT 40 MG PO TABS: 40.0000 mg | ORAL_TABLET | Freq: Every day | ORAL | 5 refills | Status: AC

## 2024-04-17 MED ORDER — ROPINIROLE HCL 0.5 MG PO TABS: 0.5000 mg | ORAL_TABLET | Freq: Three times a day (TID) | ORAL | 5 refills | Status: AC

## 2024-04-17 MED ORDER — ESZOPICLONE 2 MG PO TABS: 2.0000 mg | ORAL_TABLET | Freq: Every evening | ORAL | 3 refills | Status: AC | PRN

## 2024-04-17 MED ORDER — ONDANSETRON HCL 4 MG PO TABS
4.0000 mg | ORAL_TABLET | ORAL | 6 refills | Status: AC
  Filled 2024-04-24: qty 16, 37d supply, fill #0

## 2024-04-17 NOTE — Progress Notes (Signed)
 " Cardiology Office Note:   Date:  04/17/2024  ID:  AZARI JANSSENS, DOB [birth year], MRN [id number] PCP: Ransom Other, MD  [facility] Providers Cardiologist:  Georganna Archer, MD { Chief Complaint:  Chief Complaint  Patient presents with   Heart Problem      History of Present Illness:   Rachel Villarreal is a 79 y.o. 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  It is my first time meeting the patient today and she presents for follow-up.  The patient states that since increasing her dose of atorvastatin  she has noticed worsening of her restless leg syndrome and joint pains in her hands.  She self discontinued taking her atorvastatin  and noticed improvement in her RLS, but her joint pains in her hands have persisted.  She was seen by orthopedic surgery and was treated with a steroid taper.  She is interested in trying a PCSK9.  She reports that her blood pressure is typically elevated into the 160s which is what we see today.  She denies chest pain, SOB, PND, orthopnea, swelling.  She takes all of her other medications as prescribed without side effect or limitation.   Past Medical History:  Diagnosis Date   Abnormal Pap smear of vagina    Anemia    Asthma    Mild   Cancer (HCC)    left breast ILC   Chronic kidney disease (CKD)    stage 4   GERD (gastroesophageal reflux disease)    Gout 05/2012   Hernia, incisional    History of abnormal Pap smear    CIN III, 2001   History of blood transfusion    POST OP   History of uterine fibroid    Hypertension    Peritonitis (HCC)    Respiratory failure (HCC)    after ruptured appendix   Ruptured appendix    Seasonal allergies    Shingles 02/2013   very mild case   Urine incontinence      Studies Reviewed:    EKG: No new ECG        Cardiac Studies & Procedures   ______________________________________________________________________________________________ CARDIAC CATHETERIZATION  CARDIAC CATHETERIZATION 04/23/2023  Conclusion   Prox LAD to Mid LAD lesion is 30% stenosed.   Mid Cx lesion is 85% stenosed.   Prox RCA to Dist RCA lesion is 20% stenosed.   LV end diastolic pressure is normal.  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 LVEDP 9 mm Hg, PCWP 6/4 mean 5 mm Hg Normal right heart pressures. PAP 30/7 mean 17 mm Hg Normal cardiac output 5.11 L/min, index 3.06  Plan: patient has no active angina. While the LCx could be treated with PCI I think the appropriate thing to do is treat her medically. She is at higher bleeding risk and needs DOAC for Afib. If she has refractory angina on medical therapy then LCx PCI could be considered.  Findings Coronary Findings Diagnostic  Dominance: Right  Left Main Vessel was injected. Vessel is normal in caliber. Vessel is angiographically normal.  Left Anterior Descending Prox LAD to Mid LAD lesion is 30% stenosed.  Left Circumflex Mid Cx lesion is 85% stenosed.  First Obtuse Marginal Branch Vessel is small in size.  Right Coronary Artery Prox RCA to Dist RCA lesion is 20%  stenosed.  Intervention  No interventions have been documented.     ECHOCARDIOGRAM  ECHOCARDIOGRAM COMPLETE 10/19/2023  Narrative ECHOCARDIOGRAM REPORT    Patient Name:   Rachel Villarreal Date of Exam: 10/19/2023 Medical Rec #:  [id number]       Height:       62.0 in Accession #:    [id number]      Weight:       155.6 lb Date of Birth:  [birth year]       BSA:          1.718 m Patient Age:    77 years        BP:           132/70 mmHg Patient Gender: F               HR:           89 bpm. Exam Location:  [address]  Procedure: 2D Echo (Both Spectral and Color Flow Doppler were utilized during procedure).  Indications:    I50.20* Unspecified  systolic (congestive) heart failure  History:        Patient has prior history of Echocardiogram examinations, most recent 04/17/2023. CAD and Previous Myocardial Infarction, Arrythmias:Atrial Fibrillation; Risk Factors:Hypertension and Dyslipidemia. ESRD. Asthma. Left breast cancer.  Sonographer:    Jon Hacker RCS Referring Phys: [phone] JACKEE DEL, JR DICK  IMPRESSIONS   1. Left ventricular ejection fraction, by estimation, is 50 to 55%. The left ventricle has low normal function. The left ventricle has no regional wall motion abnormalities. Left ventricular diastolic parameters are indeterminate. Elevated left ventricular end-diastolic pressure. 2. Right ventricular systolic function is normal. The right ventricular size is normal. There is mildly elevated pulmonary artery systolic pressure. 3. Left atrial size was moderately dilated. 4. The mitral valve is normal in structure. Trivial mitral valve regurgitation. No evidence of mitral stenosis. 5. The aortic valve is tricuspid. Aortic valve regurgitation is not visualized. No aortic stenosis is present. 6. The inferior vena cava is normal in size with greater than 50% respiratory variability, suggesting right atrial pressure of 3 mmHg.  Comparison(s): Changes from prior study are noted. The left ventricular function has improved.  FINDINGS Left Ventricle: Left ventricular ejection fraction, by estimation, is 50 to 55%. The left ventricle has low normal function. The left ventricle has no regional wall motion abnormalities. The left ventricular internal cavity size was normal in size. There is no left ventricular hypertrophy. Left ventricular diastolic parameters are indeterminate. Elevated left ventricular end-diastolic pressure.  Right Ventricle: The right ventricular size is normal. No increase in right ventricular wall thickness. Right ventricular systolic function is normal. There is mildly elevated pulmonary artery systolic pressure.  The tricuspid regurgitant velocity is 2.95 m/s, and with an assumed right atrial pressure of 3 mmHg, the estimated right ventricular systolic pressure is 37.8 mmHg.  Left Atrium: Left atrial size was moderately dilated.  Right Atrium: Right atrial size was normal in size.  Pericardium: There is no evidence of pericardial effusion.  Mitral Valve: The mitral valve is normal in structure. Mild mitral annular calcification. Trivial mitral valve regurgitation. No evidence of mitral valve stenosis.  Tricuspid Valve: The tricuspid valve is normal in structure. Tricuspid valve regurgitation is mild . No evidence of tricuspid stenosis.  Aortic Valve: The aortic valve is tricuspid. Aortic valve regurgitation is not visualized. No aortic stenosis is present.  Pulmonic Valve: The pulmonic valve was normal in structure. Pulmonic valve regurgitation is mild. No evidence  of pulmonic stenosis.  Aorta: The aortic root is normal in size and structure.  Venous: The inferior vena cava is normal in size with greater than 50% respiratory variability, suggesting right atrial pressure of 3 mmHg.  IAS/Shunts: No atrial level shunt detected by color flow Doppler.   LEFT VENTRICLE PLAX 2D LVIDd:         5.02 cm   Diastology LVIDs:         3.34 cm   LV e' medial:    7.29 cm/s LV PW:         1.13 cm   LV E/e' medial:  18.8 LV IVS:        1.07 cm   LV e' lateral:   12.00 cm/s LVOT diam:     1.90 cm   LV E/e' lateral: 11.4 LV SV:         70 LV SV Index:   41 LVOT Area:     2.84 cm   RIGHT VENTRICLE RV Basal diam:  3.06 cm RV S prime:     8.81 cm/s TAPSE (M-mode): 1.3 cm RVSP:           37.8 mmHg  LEFT ATRIUM             Index        RIGHT ATRIUM           Index LA diam:        4.90 cm 2.85 cm/m   RA Pressure: 3.00 mmHg LA Vol (A2C):   61.6 ml 35.85 ml/m  RA Area:     8.74 cm LA Vol (A4C):   52.1 ml 30.32 ml/m  RA Volume:   12.90 ml  7.51 ml/m LA Biplane Vol: 60.1 ml 34.98 ml/m AORTIC  VALVE LVOT Vmax:   119.00 cm/s LVOT Vmean:  79.700 cm/s LVOT VTI:    0.246 m  AORTA Ao Root diam: 2.60 cm Ao Asc diam:  3.80 cm  MITRAL VALVE                TRICUSPID VALVE MV Area (PHT): 4.52 cm     TR Peak grad:   34.8 mmHg MV Decel Time: 168 msec     TR Vmax:        295.00 cm/s MV E velocity: 137.00 cm/s  Estimated RAP:  3.00 mmHg MV A velocity: 135.00 cm/s  RVSP:           37.8 mmHg MV E/A ratio:  1.01 SHUNTS Systemic VTI:  0.25 m Systemic Diam: 1.90 cm  Annabella Scarce MD Electronically signed by Annabella Scarce MD Signature Date/Time: 10/19/2023/4:33:57 PM    Final    MONITORS  CARDIAC EVENT MONITOR 07/17/2023  Narrative   Patient had a minimum heart rate of 55 bpm, maximum heart rate of 111 bpm, and average heart rate of 74 bpm. Predominant underlying rhythm was sinus rhythm. Isolated PACs were occasional (1.0%). Isolated PVCs were occasional (1.0%). No triggered and diary events associated.  No episodes of atrial fibrillation or flutter.       ______________________________________________________________________________________________      Risk Assessment/Calculations:    CHA2DS2-VASc Score = 6   This indicates a 9.7% annual risk of stroke. The patient's score is based upon: CHF History: 1 HTN History: 1 Diabetes History: 0 Stroke History: 0 Vascular Disease History: 1 Age Score: 2 Gender Score: 1          Physical Exam:     VS:  BP (!) 162/72 (BP  Location: Left Arm, Patient Position: Sitting, Cuff Size: Normal)   Pulse (!) 101   Ht 5' 2 (1.575 m)   Wt 159 lb (72.1 kg)   SpO2 97%   BMI 29.08 kg/m      Wt Readings from Last 3 Encounters:  03/28/24 150 lb (68 kg)  02/25/24 150 lb (68 kg)  12/27/23 167 lb 1.7 oz (75.8 kg)     GEN: Well nourished, well developed, in no acute distress NECK: No JVD; No carotid bruits CARDIAC: RRR, no murmurs, rubs, gallops RESPIRATORY:  Clear to auscultation without rales, wheezing or rhonchi   ABDOMEN: Soft, non-tender, non-distended, normal bowel sounds, colostomy bag present and is C/D/I EXTREMITIES:  Warm and well perfused, no edema; RUE fistula with palpable thrill and audible bruit, 2+ radial pulses PSYCH: Normal mood and affect   Assessment & Plan   #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  LHC demonstrated multivessel CAD that was not amenable to PCI. - She remains asymptomatic and is being medically managed. Continue Eliquis  2.5 mg twice daily Would favor not adding an antiplatelet given her history of subarachnoid hemorrhage Cholesterol management as described below Follow-up in 3 months  #PAF - Appears to be in irregular rhythm.  Will change her metoprolol  to carvedilol  for improved blood pressure control. Stop metoprolol  succinate Start carvedilol  12.5 mg twice daily  #HTN - Blood pressure is not currently at goal.  Aggressive blood pressure treatment is vital for this patient. - Will change her metoprolol  to carvedilol  and start losartan . Switch beta-blocker to carvedilol  12.5 mg twice daily Start losartan  50 mg daily  #HLD #Statin Intolerance - The patient has now felt therapy with 2 different statins including simvastatin and atorvastatin  due to muscle aches and increase in RLS symptoms. - She is an excellent candidate for PCSK9 inhibitor and it is vital that her LDL be as low as possible. - I will refer the patient to Pharm.D. for PCSK9 initiation. - I will also start Zetia  in the interim while she is off of her statin. No longer taking statins due to intolerance Referral to Pharm.D. for PCSK9 Start Zetia  10 mg daily  #HFimpEF - Severely reduced LVEF in the setting of her NSTEMI with recovery of EF on most recent echocardiogram. - Currently just on metoprolol  for GDMT. - I will add back losartan  today.  She is not a great  candidate for spironolactone or Jardiance given her ESRD status. Switching to Coreg  as described above Start losartan  50 mg once a day         This note was written with the assistance of a dictation microphone or AI dictation software. Please excuse any typos or grammatical errors.   Signed, Georganna Archer, MD 04/17/2024 10:02 PM    [facility]  "

## 2024-04-18 ENCOUNTER — Ambulatory Visit
Attending: Student in an Organized Health Care Education/Training Program | Admitting: Student in an Organized Health Care Education/Training Program

## 2024-04-18 ENCOUNTER — Other Ambulatory Visit (HOSPITAL_BASED_OUTPATIENT_CLINIC_OR_DEPARTMENT_OTHER): Payer: Self-pay

## 2024-04-18 ENCOUNTER — Encounter: Payer: Self-pay | Admitting: Student in an Organized Health Care Education/Training Program

## 2024-04-18 VITALS — BP 162/72 | HR 101 | Ht 62.0 in | Wt 159.0 lb

## 2024-04-18 DIAGNOSIS — E7849 Other hyperlipidemia: Secondary | ICD-10-CM

## 2024-04-18 MED ORDER — CARVEDILOL 12.5 MG PO TABS
12.5000 mg | ORAL_TABLET | Freq: Two times a day (BID) | ORAL | 0 refills | Status: AC
  Filled 2024-04-18: qty 180, 90d supply, fill #0

## 2024-04-18 MED ORDER — EZETIMIBE 10 MG PO TABS
10.0000 mg | ORAL_TABLET | Freq: Every day | ORAL | 0 refills | Status: AC
  Filled 2024-04-18: qty 90, 90d supply, fill #0

## 2024-04-18 MED ORDER — LOSARTAN POTASSIUM 50 MG PO TABS
50.0000 mg | ORAL_TABLET | Freq: Every day | ORAL | 0 refills | Status: AC
  Filled 2024-04-18: qty 90, 90d supply, fill #0

## 2024-04-18 NOTE — Patient Instructions (Signed)
 Medication Instructions:  STOP Metoprolol  Succinate 25 mg  START Zetia  10 mg Take 1 tablet daily  START Carvedilol  12.5 mg Twice daily START Losartan  50 mg Once daily  *If you need a refill on your cardiac medications before your next appointment, please call your pharmacy*  Lab Work: NONE ORDERED If you have labs (blood work) drawn today and your tests are completely normal, you will receive your results only by: MyChart Message (if you have MyChart) OR A paper copy in the mail If you have any lab test that is abnormal or we need to change your treatment, we will call you to review the results.  Testing/Procedures: NONE ORDERED  Follow-Up: At [facility], you and your health needs are our priority.  As part of our continuing mission to provide you with exceptional heart care, our providers are all part of one team.  This team includes your primary Cardiologist (physician) and Advanced Practice Providers or APPs (Physician Assistants and Nurse Practitioners) who all work together to provide you with the care you need, when you need it.  Your next appointment:   3 month(s)  Provider:   Dr. Floretta  We recommend signing up for the patient portal called MyChart.  Sign up information is provided on this After Visit Summary.  MyChart is used to connect with patients for Virtual Visits (Telemedicine).  Patients are able to view lab/test results, encounter notes, upcoming appointments, etc.  Non-urgent messages can be sent to your provider as well.   To learn more about what you can do with MyChart, go to [email or web address].   Other Instructions Your provider wants you to follow up with PharmD

## 2024-04-24 ENCOUNTER — Other Ambulatory Visit (HOSPITAL_BASED_OUTPATIENT_CLINIC_OR_DEPARTMENT_OTHER): Payer: Self-pay

## 2024-05-09 ENCOUNTER — Other Ambulatory Visit (HOSPITAL_BASED_OUTPATIENT_CLINIC_OR_DEPARTMENT_OTHER): Payer: Self-pay

## 2024-05-14 ENCOUNTER — Telehealth (HOSPITAL_BASED_OUTPATIENT_CLINIC_OR_DEPARTMENT_OTHER): Payer: Self-pay | Admitting: *Deleted

## 2024-05-14 ENCOUNTER — Telehealth: Payer: Self-pay | Admitting: Hematology and Oncology

## 2024-05-14 NOTE — Telephone Encounter (Signed)
"  ° °  Pre-operative Risk Assessment    Patient Name: Rachel Villarreal  DOB: 1945/04/20 MRN: [id number]   Date of last office visit: 04/18/2024 Date of next office visit: 07/25/2024  Request for Surgical Clearance    Procedure:  Colostomy reversal surgery  Date of Surgery:  Clearance 06/16/24                                 Surgeon:  Dr. Leonor Dawn Surgeon's Group or Practice Name:  [facility] Phone number:  [phone] Fax number:  [phone]   Type of Clearance Requested:   - Medical  - Pharmacy:  Hold Apixaban  (Eliquis ) Not indicated.   Type of Anesthesia:  General    Additional requests/questions:    Signed, Edsel Grayce Sanders   05/14/2024, 12:07 PM   "

## 2024-05-14 NOTE — Telephone Encounter (Signed)
 I spoke with patient and she is rescheduled from 06/04/2024 to 06/16/2024 due to MD being OOO. Patient aware of date/time change.

## 2024-05-16 ENCOUNTER — Other Ambulatory Visit: Payer: Self-pay

## 2024-05-16 ENCOUNTER — Other Ambulatory Visit (HOSPITAL_BASED_OUTPATIENT_CLINIC_OR_DEPARTMENT_OTHER): Payer: Self-pay

## 2024-05-22 ENCOUNTER — Ambulatory Visit

## 2024-05-28 ENCOUNTER — Ambulatory Visit (HOSPITAL_COMMUNITY)

## 2024-06-04 ENCOUNTER — Ambulatory Visit: Admitting: Hematology and Oncology

## 2024-06-16 ENCOUNTER — Inpatient Hospital Stay: Admitting: Hematology and Oncology

## 2024-07-25 ENCOUNTER — Ambulatory Visit: Admitting: Student in an Organized Health Care Education/Training Program
